# Patient Record
Sex: Male | Born: 1935 | Race: White | Hispanic: No | Smoking: Former smoker
Health system: Southern US, Community
[De-identification: ages and names within clinical notes are randomized; demographics above are authoritative.]

## PROBLEM LIST (undated history)

## (undated) DIAGNOSIS — N189 Chronic kidney disease, unspecified: Secondary | ICD-10-CM

## (undated) DIAGNOSIS — K635 Polyp of colon: Secondary | ICD-10-CM

## (undated) DIAGNOSIS — K219 Gastro-esophageal reflux disease without esophagitis: Secondary | ICD-10-CM

## (undated) DIAGNOSIS — I442 Atrioventricular block, complete: Secondary | ICD-10-CM

## (undated) DIAGNOSIS — K2270 Barrett's esophagus without dysplasia: Secondary | ICD-10-CM

## (undated) DIAGNOSIS — M1990 Unspecified osteoarthritis, unspecified site: Secondary | ICD-10-CM

## (undated) DIAGNOSIS — I25810 Atherosclerosis of coronary artery bypass graft(s) without angina pectoris: Secondary | ICD-10-CM

## (undated) DIAGNOSIS — M810 Age-related osteoporosis without current pathological fracture: Secondary | ICD-10-CM

## (undated) DIAGNOSIS — I10 Essential (primary) hypertension: Secondary | ICD-10-CM

## (undated) DIAGNOSIS — R0602 Shortness of breath: Secondary | ICD-10-CM

## (undated) DIAGNOSIS — J189 Pneumonia, unspecified organism: Secondary | ICD-10-CM

## (undated) DIAGNOSIS — I2510 Atherosclerotic heart disease of native coronary artery without angina pectoris: Secondary | ICD-10-CM

## (undated) DIAGNOSIS — G4730 Sleep apnea, unspecified: Secondary | ICD-10-CM

## (undated) DIAGNOSIS — I219 Acute myocardial infarction, unspecified: Secondary | ICD-10-CM

## (undated) DIAGNOSIS — R55 Syncope and collapse: Secondary | ICD-10-CM

## (undated) DIAGNOSIS — I639 Cerebral infarction, unspecified: Secondary | ICD-10-CM

## (undated) DIAGNOSIS — N400 Enlarged prostate without lower urinary tract symptoms: Secondary | ICD-10-CM

## (undated) DIAGNOSIS — J449 Chronic obstructive pulmonary disease, unspecified: Secondary | ICD-10-CM

## (undated) DIAGNOSIS — I480 Paroxysmal atrial fibrillation: Secondary | ICD-10-CM

## (undated) DIAGNOSIS — E785 Hyperlipidemia, unspecified: Secondary | ICD-10-CM

## (undated) DIAGNOSIS — I255 Ischemic cardiomyopathy: Secondary | ICD-10-CM

## (undated) DIAGNOSIS — Z950 Presence of cardiac pacemaker: Secondary | ICD-10-CM

## (undated) DIAGNOSIS — I739 Peripheral vascular disease, unspecified: Secondary | ICD-10-CM

## (undated) HISTORY — PX: LESION EXCISION: SHX5167

## (undated) HISTORY — PX: PROSTATE SURGERY: SHX751

## (undated) HISTORY — DX: Benign prostatic hyperplasia without lower urinary tract symptoms: N40.0

## (undated) HISTORY — DX: Hyperlipidemia, unspecified: E78.5

## (undated) HISTORY — DX: Atrioventricular block, complete: I44.2

## (undated) HISTORY — DX: Chronic obstructive pulmonary disease, unspecified: J44.9

## (undated) HISTORY — DX: Gastro-esophageal reflux disease without esophagitis: K21.9

## (undated) HISTORY — DX: Age-related osteoporosis without current pathological fracture: M81.0

## (undated) HISTORY — DX: Paroxysmal atrial fibrillation: I48.0

## (undated) HISTORY — DX: Essential (primary) hypertension: I10

## (undated) HISTORY — DX: Cerebral infarction, unspecified: I63.9

## (undated) HISTORY — DX: Barrett's esophagus without dysplasia: K22.70

## (undated) HISTORY — DX: Chronic kidney disease, unspecified: N18.9

---

## 1976-05-07 HISTORY — PX: CORONARY ARTERY BYPASS GRAFT: SHX141

## 1998-01-03 ENCOUNTER — Ambulatory Visit (HOSPITAL_COMMUNITY): Admission: RE | Admit: 1998-01-03 | Discharge: 1998-01-03 | Payer: Self-pay | Admitting: Cardiology

## 1998-11-02 ENCOUNTER — Ambulatory Visit (HOSPITAL_COMMUNITY): Admission: RE | Admit: 1998-11-02 | Discharge: 1998-11-02 | Payer: Self-pay | Admitting: Cardiology

## 1998-11-02 ENCOUNTER — Encounter: Payer: Self-pay | Admitting: Cardiology

## 2000-04-01 ENCOUNTER — Ambulatory Visit (HOSPITAL_COMMUNITY): Admission: RE | Admit: 2000-04-01 | Discharge: 2000-04-01 | Payer: Self-pay | Admitting: Cardiology

## 2000-04-02 ENCOUNTER — Encounter (HOSPITAL_COMMUNITY): Admission: RE | Admit: 2000-04-02 | Discharge: 2000-07-01 | Payer: Self-pay | Admitting: Cardiology

## 2000-06-12 ENCOUNTER — Ambulatory Visit (HOSPITAL_COMMUNITY): Admission: RE | Admit: 2000-06-12 | Discharge: 2000-06-12 | Payer: Self-pay | Admitting: *Deleted

## 2000-06-12 ENCOUNTER — Encounter (INDEPENDENT_AMBULATORY_CARE_PROVIDER_SITE_OTHER): Payer: Self-pay | Admitting: Specialist

## 2000-10-07 ENCOUNTER — Ambulatory Visit (HOSPITAL_COMMUNITY): Admission: RE | Admit: 2000-10-07 | Discharge: 2000-10-07 | Payer: Self-pay | Admitting: Cardiology

## 2000-10-07 HISTORY — PX: CARDIAC CATHETERIZATION: SHX172

## 2001-03-07 ENCOUNTER — Inpatient Hospital Stay (HOSPITAL_COMMUNITY): Admission: EM | Admit: 2001-03-07 | Discharge: 2001-03-08 | Payer: Self-pay | Admitting: Cardiology

## 2001-03-07 ENCOUNTER — Emergency Department (HOSPITAL_COMMUNITY): Admission: EM | Admit: 2001-03-07 | Discharge: 2001-03-07 | Payer: Self-pay | Admitting: *Deleted

## 2001-03-07 ENCOUNTER — Encounter: Payer: Self-pay | Admitting: Cardiology

## 2001-03-15 ENCOUNTER — Encounter (INDEPENDENT_AMBULATORY_CARE_PROVIDER_SITE_OTHER): Payer: Self-pay | Admitting: Specialist

## 2001-03-15 ENCOUNTER — Inpatient Hospital Stay (HOSPITAL_COMMUNITY): Admission: EM | Admit: 2001-03-15 | Discharge: 2001-03-18 | Payer: Self-pay | Admitting: Emergency Medicine

## 2001-08-11 ENCOUNTER — Ambulatory Visit (HOSPITAL_COMMUNITY): Admission: RE | Admit: 2001-08-11 | Discharge: 2001-08-11 | Payer: Self-pay | Admitting: *Deleted

## 2001-08-11 ENCOUNTER — Encounter (INDEPENDENT_AMBULATORY_CARE_PROVIDER_SITE_OTHER): Payer: Self-pay

## 2003-04-12 HISTORY — PX: CARDIAC CATHETERIZATION: SHX172

## 2003-05-08 HISTORY — PX: CHOLECYSTECTOMY: SHX55

## 2005-05-07 HISTORY — PX: PACEMAKER PLACEMENT: SHX43

## 2006-07-02 ENCOUNTER — Ambulatory Visit (HOSPITAL_COMMUNITY): Admission: RE | Admit: 2006-07-02 | Discharge: 2006-07-03 | Payer: Self-pay | Admitting: Cardiology

## 2008-04-19 ENCOUNTER — Encounter (INDEPENDENT_AMBULATORY_CARE_PROVIDER_SITE_OTHER): Payer: Self-pay | Admitting: *Deleted

## 2008-04-19 ENCOUNTER — Ambulatory Visit (HOSPITAL_COMMUNITY): Admission: RE | Admit: 2008-04-19 | Discharge: 2008-04-19 | Payer: Self-pay | Admitting: *Deleted

## 2010-06-12 ENCOUNTER — Other Ambulatory Visit: Payer: Self-pay | Admitting: Cardiology

## 2010-06-12 ENCOUNTER — Ambulatory Visit
Admission: RE | Admit: 2010-06-12 | Discharge: 2010-06-12 | Disposition: A | Discharge status: Home / Self Care | Payer: 59 | Source: Ambulatory Visit | Attending: Cardiology | Admitting: Cardiology

## 2010-06-12 DIAGNOSIS — R0602 Shortness of breath: Secondary | ICD-10-CM

## 2010-06-14 ENCOUNTER — Ambulatory Visit (HOSPITAL_COMMUNITY)
Admission: RE | Admit: 2010-06-14 | Discharge: 2010-06-14 | Disposition: A | Discharge status: Home / Self Care | Payer: Medicare Other | Source: Ambulatory Visit | Attending: Cardiology | Admitting: Cardiology

## 2010-06-14 DIAGNOSIS — I2510 Atherosclerotic heart disease of native coronary artery without angina pectoris: Secondary | ICD-10-CM | POA: Insufficient documentation

## 2010-06-14 DIAGNOSIS — Z951 Presence of aortocoronary bypass graft: Secondary | ICD-10-CM | POA: Insufficient documentation

## 2010-06-14 DIAGNOSIS — I2582 Chronic total occlusion of coronary artery: Secondary | ICD-10-CM | POA: Insufficient documentation

## 2010-06-14 DIAGNOSIS — R0602 Shortness of breath: Secondary | ICD-10-CM | POA: Insufficient documentation

## 2010-06-14 HISTORY — PX: CARDIAC CATHETERIZATION: SHX172

## 2010-06-21 NOTE — Procedures (Signed)
NAME:  Richard Chavez, Richard Chavez                  ACCOUNT NO.:  [id number]  MEDICAL RECORD NO.:  [id number]           PATIENT TYPE:  O  LOCATION:  [facility]                         FACILITY:  [facility]  PHYSICIAN:  Ritta Slot, MD     DATE OF BIRTH:  04-27-1927  DATE OF PROCEDURE:  06/14/2010 DATE OF DISCHARGE:  06/14/2010                           CARDIAC CATHETERIZATION   This is a left heart catheterization with graft.  INDICATIONS:  Mr. Richard Chavez is a very pleasant 77 year old gentleman with a history of CABG in 1998 with SVG to the RCA, SVG to circumflex, SVG to LIMA from December 2004, normal LV function at that time.  His most recent echocardiogram on October 2011 showed an EF of 35% to 40% with a negative nuclear perfusion stress test.  Despite that, he has been having increased episodes of shortness of breath, I therefore decided to bring him back to cardiac catheterization laboratory to exclude graft dysfunction as a cause of his low EF.  After informed consent, the patient was prepped and draped in usual sterile fashion. His right groin was accessed using a 5-French sheath into the RFA using a modified Seldinger technique without difficulty.  He received 2 mg of Versed and 25 mcg of fentanyl for IV light sedation.  Initially through the RFA, I passed the JR-4 catheter.  This was used to engage the SVG to the OM, the SVG to the RCA, the native RCA as well as getting access in the left subclavian.  Once I was into the left subclavian, I used an exchange wire, which I exchanged the RCA catheter for a LIMA catheter. The LIMA catheter was then used engage the LIMA without difficulty.  The LIMA catheter was then exchanged over J-wire for the JL-4 catheter.  JL- 4 catheter was placed in the left main without difficulty.  FINDINGS:  AO pressure 144/71, mean of 98.  RCA, the native RCA was 100% occluded.  The left circulatory system was found to have a patent left main into the circumflex.   Circumflex had a 100% occlusion in its midportion.  The LAD was occluded and bifurcation at its ostium of the left main.  The LIMA was found be patent down all the way down to the LAD without any evidence of any occlusion as retrograde filling of the LAD via the LIMA as well as the diagonal.  The SVG to the OM with circumflex territory was found to be patent, free of any disease.  There was no significant disease in the distal portion and the graft was widely patent.  SVG to the RCA was widely patent all the way down to bifurcation of the PDA and PLA.  There is retrograde filling of the RCA by the SVG graft.  There is no significant disease.  I do not perform left ventriculography due to his creatinine peaked to 1.4 at the age of 77 years old.  I did use a total of 100 mL of contrast up.  FINAL CONCLUSIONS:  A patent LIMA to the LAD, patent SVG to the RCA, patent SVG to OM1 circumflex, 100% occluded  RCA, 100% occluded circumflex, 100% occluded LAD.  Normal graft.  No evidence of any graft dysfunction.  We will continue on his current medical therapy.  I do not think his graft dysfunction is a cause of his shortness of breath.    Ritta Slot, MD    HS/MEDQ  D:  06/14/2010  T:  06/15/2010  Job:  [id number]  Electronically Signed by Ritta Slot MD on 06/21/2010 01:47:31 PM

## 2010-09-19 NOTE — Op Note (Signed)
NAME:  Richard Chavez, Richard Chavez                  ACCOUNT NO.:  [id number]   MEDICAL RECORD NO.:  [id number]          PATIENT TYPE:  AMB   LOCATION:  ENDO                         FACILITY:  [facility]   PHYSICIAN:  Georgiana Spinner, M.D.    DATE OF BIRTH:  09-Nov-1926   DATE OF PROCEDURE:  DATE OF DISCHARGE:                               OPERATIVE REPORT   PROCEDURE:  Upper endoscopy.   INDICATIONS:  GERD.   ANESTHESIA:  Fentanyl 37.5 mcg, Versed 3 mg.   PROCEDURE:  With the patient mildly sedated in the left lateral  decubitus position the Pentax videoscopic endoscope inserted in the  mouth and passed under direct vision through the esophagus which  appeared normal then we advanced through the lower esophageal sphincter  into the stomach, fundus, body, antrum, duodenal bulb, second portion  duodenum were eventually seen and all appeared normal.  From this point,  the endoscope was slowly withdrawn taking circumferential views of  duodenal mucosa until the endoscope had been pulled back into the  stomach and placed in retroflexion to view the stomach from below.  The  endoscope was straightened and withdrawn taking circumferential views of  the remaining gastric and esophageal mucosa.  The patient's vital signs  and pulse oximeter remained stable.  The patient tolerated the procedure  well without apparent complications.   FINDINGS:  Unremarkable examination with loose wrap of the GE junction  around the endoscope indicating laxity of the lower esophageal  sphincter.   PLAN:  Proceed to colonoscopy.           ______________________________  Georgiana Spinner, M.D.     GMO/MEDQ  D:  04/19/2008  T:  04/20/2008  Job:  [id number]

## 2010-09-19 NOTE — Op Note (Signed)
NAME:  Richard Chavez, Richard Chavez                  ACCOUNT NO.:  [id number]   MEDICAL RECORD NO.:  [id number]          PATIENT TYPE:  AMB   LOCATION:  ENDO                         FACILITY:  [facility]   PHYSICIAN:  Georgiana Spinner, M.D.    DATE OF BIRTH:  Oct 22, 1926   DATE OF PROCEDURE:  04/19/2008  DATE OF DISCHARGE:                               OPERATIVE REPORT   PROCEDURE:  Colonoscopy.   INDICATIONS:  Colon polyps.   ANESTHESIA:  Fentanyl 12 mcg, Versed 2 mg.   PROCEDURE:  With the patient mildly sedated in the left lateral  decubitus position, a rectal examination was attempted.  Subsequently  the Pentax videoscopic colonoscope was inserted in the rectum and passed  under direct vision with pressure applied.  The patient rolled to his  back to reach the cecum, identified by ileocecal valve and appendiceal  orifice, both of which were photographed.  From this point the  colonoscope was slowly withdrawn taking circumferential views of colonic  mucosa, stopping in the ascending colon where a polyp was seen,  photographed, removed using hot biopsy forceps technique setting of  20/150 blended current.  We next stopped in the hepatic flexure where a  second polyp was seen.  It too was photographed and it too was removed  using hot biopsy forceps with the same setting.  The endoscope was then  further withdrawn taking circumferential views of the remaining colonic  mucosa, stopping only in the rectum where a multilobulated polyp seen,  photographed and removed using snare cautery technique setting of 20/150  blended current.  Tissue was retrieved by suction and the polyp through  the endoscope into a tissue trap.  Then subsequently we hot biopsied the  base because there was some bleeding from this point.  The endoscope was  then placed in retroflexion to view the anal canal from below.  The  endoscope was then straightened and withdrawn taking circumferential  views of the remaining colonic mucosa.   The patient's vital signs and  pulse oximeter remained stable.  The patient tolerated the procedure  well without apparent complications.   FINDINGS:  Polyps of the ascending colon, hepatic flexure and rectum.  Some diverticula noted in the sigmoid colon; otherwise unremarkable  exam.   PLAN:  Await biopsy report.  The patient will call me for results and  follow up with me as an outpatient.           ______________________________  Georgiana Spinner, M.D.     GMO/MEDQ  D:  04/19/2008  T:  04/20/2008  Job:  [id number]

## 2010-09-22 NOTE — Procedures (Signed)
[facility]  Patient:    Richard Chavez, Richard Chavez                         MRN: [id number] Proc. Date: 06/12/00 Adm. Date:  [id number] Attending:  Sabino Gasser                           Procedure Report  PROCEDURE:  Upper endoscopy.  INDICATION FOR PROCEDURE:  Reflux symptomatology.  ANESTHESIA:  Demerol 50 mg, Versed 5 mg.  DESCRIPTION OF PROCEDURE:  With the patient mildly sedated in the left lateral decubitus position, the Olympus video endoscope was inserted in the mouth and passed under direct vision through the esophagus, photograph taken. We entered into the stomach into the hiatal hernia. The fundus, body, antrum, duodenal bulb, and second portion of the duodenum were all well visualized and appeared normal. Photographs were taken. From this point, the endoscope was slowly withdrawn taking circumferential views of the entire duodenal mucosa until the endoscope was then pulled back into the stomach, placed in retroflexion to view the stomach from below and a hiatal hernia was visualized from below. The endoscope was then straightened taking circumferential views of the entire gastric mucosa stopping in the distal esophagus where there was a question of a short segment Barretts esophagus seen, photographed, and biopsied. We then withdrew the endoscope taking circumferential views of the remaining esophageal mucosa which otherwise appeared normal.  IMPRESSION:  Possible short segment Barretts esophagus above a hiatal hernia. Await biopsy report. The patient will call me for results and followup with me in as an outpatient and continue Nexium which has helped his symptoms tremendously. DD:  06/12/00 TD:  06/13/00 Job: [id number]

## 2010-09-22 NOTE — Discharge Summary (Signed)
[facility]  Patient:    SAYRE, WITHERINGTON Visit Number: [id number] MRN: [id number]          Service Type: MED Location: [id number] Attending Physician:  Meredith Leeds Dictated by:   Jaclyn Prime. Lucas Mallow, M.D. Admit Date:  03/15/2001 Discharge Date: 03/18/2001                             Discharge Summary  CHIEF COMPLAINT:  Abdominal pain and nausea.  HISTORY OF PRESENT ILLNESS:  This 77 year old male came into the emergency room with a chief complaint of vomiting and abdominal pain.  This developed after he ate spaghetti the night before admission.  He had vomited three or four times.  He had no chest pain.  He did have some diaphoresis. Comprehensive details of past medical history, family, and social history, as well as a comprehensive review of systems, are to be found in the history and physical.  PHYSICAL EXAMINATION:  GENERAL:  He was a moderately overweight, but otherwise well-developed, well-nourished man who had received a GI cocktail after many hours in the emergency room, and then felt better.  VITAL SIGNS:  Blood pressure 140/90, pulse 48 and regular, respiratory rate 18 and unlabored, temperature 96.8 degrees.  He had an otherwise generally unremarkable physical exam.  ABDOMEN:  Moderately protuberant, and when seen by me it was not tender.  The rest of the physical examination was unremarkable.  LABORATORY DATA:  Total CPK peaked at 303 units with 3.9 units of MB band. troponin-I was 0.02, 0.02, and 0.01 on three occasions.  The other routine laboratory data appear not to appear in the chart at this point.  EKG was consistent with sinus bradycardia.  HOSPITAL COURSE:  Following treatment as noted above, he had prompt resolution of his abdominal discomfort, and spent a comfortable night.  He was seen on rounds by the on call physician the next morning, considered to be stable and without further problems, and was discharged at  that time.  FINAL DIAGNOSES: 1. Acute nausea and vomiting, resolved. 2. Abdominal tenderness, resolved. 3. Coronary heart disease, with no evidence of significant myocardial    ischemia. 4. History of smoking. 5. Hiatal hernia.  CONDITION ON DISCHARGE:  Improved and stable.  FOLLOWUP:  Dr. Lucas Mallow in one week.  DISCHARGE MEDICATIONS:  As at admission, which would include Toprol 50 mg q.d., Nexium 40 mg q.d., Cozaar 50 mg q.d., Lipitor 20 mg q.d., Advair inhaler q.d., Ventolin inhaler 2 puffs b.i.d., coated aspirin one q.d.  ADDITIONAL FINAL DIAGNOSES:  Chronic obstructive pulmonary disease.  OPERATIONS:  None.  COMPLICATIONS:  None.  RETURN TO WORK:  Inapplicable.Dictated by:   Jaclyn Prime. Lucas Mallow, M.D.  Attending Physician:  Meredith Leeds DD:  04/17/01 TD:  04/17/01 Job: [id number]

## 2010-09-22 NOTE — Discharge Summary (Signed)
[facility]  Patient:    Richard Chavez, Richard Chavez. Visit Number: [id number] MRN: [id number]          Service Type: Attending:  Zigmund Daniel, M.D. Dictated by:   Zigmund Daniel, M.D. Adm. Date:  03/15/01 Disc. Date: 03/18/01                             Discharge Summary  HISTORY OF PRESENT ILLNESS:  The patient is a 77 year old white male who is shown to have gallstones on ultrasound.  CT was compatible with acute cholecystitis.  Symptoms were abdominal pain and vomiting.  There is also a history of coronary artery disease and reflux esophagitis, both felt to be stable.  See other records for more details.  HOSPITAL COURSE:  The patient was admitted late at night on 03/15/01, and was stabilized, given IV fluids, and pain medication.  The next day, he underwent a laparoscopic cholecystectomy.  This was somewhat difficult because of acute findings, but it was able to be done that way.  A drain was left in.  The next morning, he was stable and comfortable, but moderate drainage that appeared to be bloody, but perhaps bilious.  IV antibiotics were continued.  On 03/18/01, the patient was doing quite well, with a soft belly, drainage had almost gone away, but still had a slight yellow color.  It was felt that it was possible he had a small leak of bile.  He desired discharge, and was sent home with the drain in place, and asked to see me in the office in 6 to 7 days for probable removal of the drain.  DIAGNOSES: 1. Cholelithiasis. 2. Acute cholecystitis.  OPERATION:  Laparoscopic cholecystectomy.  CONDITION ON DISCHARGE:  Improved. Dictated by:   Zigmund Daniel, M.D. Attending:  Zigmund Daniel, M.D. DD:  04/22/01 TD:  04/22/01 Job: [id number]

## 2010-09-22 NOTE — H&P (Signed)
[facility]  Patient:    Richard Chavez, DEC Visit Number: [id number] MRN: [id number]          Service Type: MED Location: [id number] [id number] Attending Physician:  Meredith Leeds Dictated by:   Zigmund Daniel, M.D. Admit Date:  03/15/2001 Discharge Date: 03/18/2001                           History and Physical  CHIEF COMPLAINT:  Abdominal pain.  HISTORY OF PRESENT ILLNESS:  The patient is a 77 year old white male with several episodes of upper abdominal pain.  He was hospitalized in early November with vomiting and abdominal pain and was felt to have viral enteritis.  With conservative treatment, that got better.  A CT scan done on the day of admission showed cholelithiasis and evidence of acute cholecystitis.  He is admitted for resuscitation and cholecystectomy.  PAST MEDICAL HISTORY:  The patient has coronary artery disease and has had coronary bypass grafting in 1988.  He occasionally has chest discomfort.  The patient is a former smoker.  Childhood illnesses are not remarkable.  MEDICATIONS: 1. Toprol 50 mg daily. 2. Cozaar 50 mg daily. 3. Nexium 40 mg daily. 4. Vitamins and fish oil. 5. Occasional inhaler for asthma.  REVIEW OF SYSTEMS:  Unremarkable beyond the above.  PHYSICAL EXAMINATION:  Temperature and vital signs unremarkable per nursing report.  MENTAL STATUS:  Normal.  HEENT:  Unremarkable.  NECK:  Unremarkable.  CHEST:  Clear to auscultation.  HEART:  Regular rate and rhythm.  No murmur or gallops.  ABDOMEN:  Nontender in the right upper quadrant.  Slightly distended and protuberant.  RECTAL:  Not performed.  EXTREMITIES:  Normal.  LYMPH NODES:  None enlarged.  NEUROLOGIC:  Normal.  IMPRESSION: 1. Acute cholecystitis and cholelithiasis. 2. Hypertension, stable. 3. Coronary artery disease, stable.  PLAN:  Admission for cholecystectomy Dictated by:   Zigmund Daniel, M.D. Attending Physician:  Meredith Leeds DD:  04/22/01 TD:  04/22/01 Job: [id number]

## 2010-09-22 NOTE — Procedures (Signed)
[facility]  Patient:    Richard Chavez, Richard Chavez Visit Number: [id number] MRN: [id number]          Service Type: END Location: ENDO Attending Physician:  Sabino Gasser Dictated by:   Sabino Gasser, M.D. Proc. Date: 08/11/01 Admit Date:  08/11/2001                             Procedure Report  PROCEDURE:  Upper endoscopy with biopsy.  INDICATION:  GERD.  ANESTHESIA:  Demerol 50, Versed 3 mg.  DESCRIPTION OF PROCEDURE:  With the patient mildly sedated in the left lateral decubitus position, the Olympus videoscopic endoscope was inserted into the mouth and passed under direct vision through the esophagus which appeared normal. I did not see evidence of Barretts at this time.  We entered into the stomach; fundus, body, antrum, duodenal bulb, and second portion of duodenum all appeared normal.  From this point, the endoscope was slowly withdrawn, taking circumferential views of the duodenal mucosa until the endoscope then pulled back into the stomach and placed in retroflexion to view the stomach from below.  The endoscope was then straightened and withdrawn, taking circumferential views of the remaining gastric and esophageal mucosa. The patients vital signs and pulse oximeter remained stable.  The patient tolerated the procedure well without apparent complications.  FINDINGS:  Unremarkable examination.  PLAN: 1. Await biopsy reports to rule out Barretts. 2, Have the patient follow up with me as an outpatient. Dictated by:   Sabino Gasser, M.D. Attending Physician:  Sabino Gasser DD:  08/11/01 TD:  08/11/01 Job: [id number]

## 2010-09-22 NOTE — H&P (Signed)
[facility]  Patient:    Richard Chavez, Richard Chavez Visit Number: [id number] MRN: [id number]          Service Type: MED Location: 3W [phone] Attending Physician:  Clovis Cao Dictated by:   Jaclyn Prime. Lucas Mallow, M.D. Admit Date:  03/07/2001                           History and Physical  CHIEF COMPLAINT:  Abdominal pain and nausea.  HISTORY OF PRESENT ILLNESS:  This 77 year old male, a patient of mine, came to the emergency room with a chief complaint of vomiting and abdominal pain.  He said this developed after he ate spaghetti last night.  The pain continued overnight and he came to the emergency room because he had had no relief.  He had had no diarrhea.  It is of note that he has recently had an upper GI and colonoscopic examination by Dr. Virginia Rochester.  He has vomited a total of three or four times.  He had no chest pain.  He did have some diuresis.  PAST MEDICAL HISTORY:  His past medical history is most notable for a coronary artery bypass grafting in 1988.  He has occasionally had chest discomfort over the last three or four years; however, he has never had unstable angina syndrome since his bypass surgery.  He stopped smoking at the time of his bypass surgery.  MEDICATIONS:  His current medications with assumed doses are Toprol 50 mg daily, which was recently reduced because of slow heart rate; Cozaar 50 mg daily; Nexium 40 mg daily; fish oil; multivitamins; and a metered-dose inhaler.  FAMILY HISTORY:  His father died of 66 of leukemia and had a prior heart attack.  His mother died at age 24 of dementia.  SOCIAL HISTORY:  He is retired and widowed.  He lives with his son.  REVIEW OF SYSTEMS:  CONSTITUTIONAL:  He denies fevers, chills, or sweats. EYES:  No diplopia or blurring.  ENT:  He has his own teeth and has no difficulty swallowing.  CARDIAC:  See the history of the present illness. RESPIRATORY:  He has rare wheezing, for which he uses a  metered-dose inhaler. GASTROINTESTINAL:  He has a history of hiatal hernia and is on Nexium for gastroesophageal reflux disease.  GENITOURINARY:  No burning or pain.  He has nocturia x 1 or 2.  NEUROLOGIC:  No faintness or syncope.  PSYCHIATRIC:  No depression or hallucination.  MUSCULOSKELETAL:  Occasional mild pain in his knees but no specific problem.  ENDOCRINE:  No known diabetes or thyroid disease.  HEMATOLOGIC/LYMPHATIC:  No easy bruising.  ALLERGIC/LYMPHATIC: There is a questionable allergy to PENICILLIN.  PHYSICAL EXAMINATION:  VITAL SIGNS:  Blood pressure 140/90, pulse 48 and regular, respirations 18 and unlabored, temperature 96.8 degrees.  GENERAL:  He is a moderately overweight but otherwise well-developed and well-nourished man, who is sleepy following his GI cocktail and being up most of the night.  His mood and affect and appropriate.  HEENT:  His eyes reveal no xanthelasma, icterus, or arcus senilis.  NECK:  Supple and symmetrical.  The trachea is midline and mobile.  There is no palpable thyromegaly or cervical nodes, no carotid bruit or JVD.  BACK:  Straight and nontender.  CHEST:  Clear to auscultation and percussion.  CARDIAC:  Cardiac apical impulse is cryptic.  The left border of the cardiac dullness is obscure.  The heart sounds are  distant.  The heart rhythm is regular, and the rate is quite slow.  There is no gallop, click, or murmur. His gait is not tested.  He can probably participate in a partial stress test. The digits and nails reveal no clubbing or cyanosis.  SKIN:  Without ulcer or stasis dermatitis.  ABDOMEN:  Moderately protuberant and although it was described as diffusely tender previously, it is not tender now.  Bowel sounds are present.  The abdominal aorta is not palpable, and there is no bruit.  The femoral arteries are not palpable, and there is no bruit.  EXTREMITIES:  The ankles reveal trace edema and palpable posterior  tibial pulses.  DIAGNOSTIC STUDIES:  An EKG shows sinus bradycardia, low-voltage QRS complex, and possible Q-wave in V1.  There is no acute ST-T elevation.  Initial cardiac enzymes revealed a slightly and somewhat elevated total CPK of 334 units, with 5.1 units of MB band.  Troponin I was 0.03.  His white count is 8800 with a slight shift to the left, 84% polys.  So-called comprehensive metabolic panel is normal except for creatinine of 1.3.  Lipase is 23 units, low limit of normal.  PLAN:  The patient was felt by the emergency room physician to require admission for further evaluation and possible treatment.  We will admit him, put him on a clear liquid diet, administer intravenous fluids, and repeat cardiac enzymes.  If there is no progressive change, he can probably be discharged in 24 hours.  FINAL IMPRESSION:  Acute abdominal pain and vomiting, possible gastritis or gastroenteritis, unstable coronary syndrome to be ruled out. Dictated by:   Jaclyn Prime. Lucas Mallow, M.D. Attending Physician:  Clovis Cao DD:  03/07/01 TD:  03/08/01 Job: [id number]

## 2010-09-22 NOTE — Op Note (Signed)
Richard Chavez, Richard Chavez                  ACCOUNT NO.:  [id number]   MEDICAL RECORD NO.:  [id number]          PATIENT TYPE:  OIB   LOCATION:  2807                         FACILITY:  [facility]   PHYSICIAN:  Antionette Char, MD    DATE OF BIRTH:  Feb 18, 1927   DATE OF PROCEDURE:  07/02/2006  DATE OF DISCHARGE:                               OPERATIVE REPORT   REFERRING PHYSICIAN:  Jaclyn Prime. Lucas Mallow, M.D.   DATE OF PROCEDURE:  July 02, 2006.   PROCEDURE:  Insertion of dual-chamber permanent transvenous pacemaker.   INDICATIONS FOR PROCEDURE:  Sick sinus syndrome with symptomatic  bradycardia.   PROCEDURE:  After signing an informed consent, the patient was  premedicated with 5 mg of Valium by mouth and brought to the cardiac  catheterization lab at [facility].  His right anterior chest  and base of neck were prepped and draped in a sterile fashion and a  right transverse subclavicular plane was anesthetized locally with 1%  lidocaine.  An incision was made in this anesthetized plane with the  incision being deepened into the fascial layer overlying the pectoralis  muscle.  A pocket was then formed in this fascial layer for later  insertion of the pulse generator.  We then inserted two #7 Cook  introducer sheaths percutaneously into the right subclavian vein with  both Seldinger wires being easily passed into the superior vena cava.  A  bipolar ventricular pacing electrode was then selected made by [facility], model number 1646T, serial number [id number].  After proper  preparation it was inserted through a Cook introducer sheath and  advanced to the superior vena cava.  The sheath was removed in the usual  fashion.  We then selected a bipolar active-fixation lead to put in the  atrium and inserted it through the second [facility] introducer sheath and  advanced it to the superior vena cava.  The second sheath was removed in  the usual fashion.  We then advanced the ventricular lead into  the right  atrium and right ventricle, where the tip was positioned in the apex of  the right ventricle.  Very good pacing parameters were obtained with an  R wave sensitivity of 12.3 mV, a resistance of 665 Ohms, and a minimum  voltage threshold of 0.875 V.  This lead was then secured properly at  its insertion site.  We then advanced the atrial lead into the right  atrium, where the tip was positioned anteriorly in the right atrial  appendage.  After actively advancing and fixing the screw-in lead, the  lead was analyzed finding very good pacing parameters with a minimum  voltage threshold of 0.75 V, a P wave sensitivity of 1.6 mV, and a  resistance of 500 ohms.  After obtaining these pacing parameters, the  atrial lead was secured properly at its insertion site.  The wound was  lavaged profusely with a kanamycin solution.  We then selected a pulse  generator made by [employer], model Bensley, model number [id number],  serial number [id number].  After properly analyzing  the new pulse  generator, it was attached to the pacing electrodes in the usual  fashion.  The pulse generator was then placed within the previously-  formed pocket and the wound was closed in layers using 2-0 Vicryl.  Final skin closure was obtained with a cutaneous layer of Steri-Strips.  The patient tolerated the procedure well and no complications  were noted.  At the end of the procedure a sterile bulky dressing was  applied to the wound and he was admitted to telemetry for further  monitoring and further doses of intravenous antibiotic.  Wound care  instructions were given.  Medications given:  None.      Antionette Char, MD  Electronically Signed     JRT/MEDQ  D:  07/02/2006  T:  07/02/2006  Job:  [id number]   cc:   Catheterization Lab

## 2010-09-22 NOTE — Cardiovascular Report (Signed)
[facility]  Patient:    Richard Chavez, Richard Chavez                         MRN: [id number] Proc. Date: 10/07/00 Adm. Date:  [id number] Attending:  Silvestre Mesi CC:         Jaclyn Prime. Lucas Mallow, M.D.  Cath Lab   Cardiac Catheterization  PROCEDURE: 1. Left heart catheterization. 2. Coronary cineangiography. 3. Vein graft cineangiography. 4. Left internal mammary artery graft cineangiography. 5. Left ventricular cineangiography. 6. Abdominal aortogram. 7. Perclose of the right femoral artery.  INDICATIONS:  This 77 year old male has a history of three vessel coronary artery disease, status post coronary artery bypass graft surgery in 1988.  He has had a long history of hypertension.  Recently, he has had marked weakness and a borderline stress test done in February.  A follow-up dobutamine Cardiolite stress test on May 13, showed stress-induced hypoperfusion and nonsustained ventricular tachycardia.  He also had an area of anterolateral ischemia.  He was then scheduled for cardiac catheterization.  DESCRIPTION OF PROCEDURE:  After signing an informed consent, the patient was premedicated with 50 mg of Benadryl intravenously and brought to the cardiac catheterization lab.  His right groin was prepped and draped in a sterile fashion and anesthetized locally with 1% lidocaine.  A 6 French introducer sheath was inserted percutaneously into the right femoral artery.  6 [place] #4 Judkins coronary catheters were used to make injections into the native coronary arteries.  The right coronary catheter was used to make injections into the vein grafts and also into the left internal mammary graft to the LAD.  A 6 French pigtail catheter was used to measure pressures in the left ventricle and aorta and to make midstream injections into the left ventricle and abdominal aorta.  The patient tolerated the procedure well and no complications were noted.  At the end of the  procedure, the catheter and sheath were removed from the right femoral artery and hemostasis was easily obtained with a Perclose closure system.  MEDICATIONS GIVEN:  None.  HEMODYNAMIC DATA:  Left ventricular pressure 147/0-15, aortic pressure 146/68 with a mean of 95.  Left ventricular ejection fraction was estimated at betwee 45 and 50%.  CINE FINDINGS:  Coronary cineangiography.  Left coronary artery.  The ostium and left main appear normal.  Left anterior descending.  The LAD is totally occluded at its origin. The mid and distal segment fills by way of internal mammary graft.  Circumflex coronary artery.  The circumflex has a critical 95% focal stenosis in its middle segment with the distal vessels filling by way of vein graft and also filling antegrade.  Right coronary artery.  The right coronary artery is totally occluded in its proximal segment.  The mid and distal segment fill by way of vein graft.  VEIN GRAFT CINEANGIOGRAPHY:  Right coronary artery vein graft.  The right coronary artery vein graft appears to be very normal with very normal flow into the two distal anastomotic sites.  There is a side-to-side anastomosis into an acute marginal branch and end-to-side anastomosis into the posterior descending.  The acute marginal branch has very normal flow through the vein graft and also the posterior descending has very normal flow.  The remainder of the right coronary artery was nonvisualized.  Circumflex vein graft.  This is a sequential vein graft to three sites.  All three anastomotic sites appear normal with side-to-side anastomosis in  a first and second obtuse marginal branches and an end-to-side anastomosis distally into a posterolateral branch.  The vein appears to be totally normal with normal flow throughout and supplies the first obtuse marginal or intermediate branch with very good flow.  The distal circumflex system fills both antegrade and through the vein  graft.  Left internal mammary artery graft to the LAD.  This graft appears normal with a normal appearing distal anastomotic site with an end-to-side anastomosis in the mid LAD.  The distal LAD is a small vessel, but appears normal with normal flow.  The segment of LAD proximal to the anastomosis has severe disease with severe segmental narrowing of the middle segment.  This middle segment supplies two anterolateral branches and may be the site of his abnormal ischemic area on his ready nuclear study.  This was deemed to be unapproachable by an intravascular method.  LEFT VENTRICULAR CINEANGIOGRAPHY:  The left ventricular chamber size is mildly enlarged.  The overall left ventricular contractility is mildly decreased with an ejection fraction estimated at between 45 and 50%.  The anterior segment has normal function.  The apex has moderate hypokinesia.  The inferior segment has moderate hypokinesia.  The mitral valve has mild prolapse, but without significant mitral insufficiency.  There was no calcification of the valve.  ABDOMINAL AORTOGRAM:  The abdominal aorta has diffuse calcified, atherosclerotic plaque in the mid and distal segment.  The distal segment just before the bifurcation has a mildly dilated area which would be considered to be a mild aneurysm.  This calcified atherosclerotic plaque extends into both common iliac arteries.  The renal arteries have mild plaque, but without significant stenosis.  FINAL DIAGNOSES: 1. Severe three vessel coronary artery disease. 2. Normal vein grafts to the right coronary artery and circumflex systems. 3. Normal left internal mammary artery graft to the LAD. 4. Mild left ventricular dysfunction. 5. Mild distal abdominal aortic aneurysm. 6. Successful Perclose of the right femoral artery.  DISPOSITION:  I feel that continued medical treatment is in order with follow-up of his abdominal aortic aneurysm with an ultrasound in the  office.  His ischemic area seen on the ready nuclear test probably correlates with his anterolateral branches with total occlusion of the proximal LAD and severe disease in the middle segment proximal to the distal anastomosis of the left internal mammary artery graft.  This should be treated medically. DD:  10/07/00 TD:  10/07/00 Job: [id number]

## 2010-09-22 NOTE — Op Note (Signed)
[facility]  Patient:    Richard Chavez, Richard Chavez Visit Number: [id number] MRN: [id number]          Service Type: MED Location: [phone] Attending Physician:  Meredith Leeds Dictated by:   Zigmund Daniel, M.D. Proc. Date: 03/16/01 Admit Date:  03/15/2001   CC:         Jaclyn Prime. Lucas Mallow, M.D.  Sabino Gasser, M.D.   Operative Report  PREOPERATIVE DIAGNOSIS:  Acute cholecystitis and cholelithiasis.  POSTOPERATIVE DIAGNOSIS:  Acute cholecystitis and cholelithiasis.  OPERATION:  Laparoscopic cholecystectomy.  SURGEON:  Zigmund Daniel, M.D.  ASSISTANT:  Abigail Miyamoto, M.D.  ANESTHESIA:  General.  PROCEDURE: After the patient had adequate monitoring and was well-anesthetized and had routine preparation and draping of the abdomen, I made a 3 cm transverse incision just below the umbilicus.  I dissected down to the fascia and opened it longitudinally and entered the peritoneum bluntly with my finger.  There were no adhesions.  I put in a pursestring 0 Vicryl suture and secured a Hasson cannula and inflated the abdomen with CO2.  A put in the scope and saw a distended, inflamed gallbladder with adhesions of omentum to the undersurface.  The liver appeared normal.  The intestines appeared normal. Placing the patient head up and foot down and tilted to the left, I put in a 10 mm epigastric port and two 5 mm right subcostal ports and drained the gallbladder with a suction aspirator.  The gallbladder was extremely fragile and also bled easily.  It was thick-walled and extremely inflamed.  Some small stones were spilled into the abdomen, and I retrieved those as well as possible and irrigated them and sucked them up into a large irrigator as well.  I retracted the fundus of the gallbladder upward and dissected away the adhesions and then retracted the infundibulum laterally and dissected until I felt I had identified the cystic duct.  On attempting to  dissect around the cystic duct, it broke in two because it was extremely fragile.  I grasped the cystic duct stump and attempted to put clips on it and could only get one clip on because of the inflammation in the tissues and difficulty in visualizing the structures.  I did find the cystic artery and clipped it with three clips and cut between the two closer to the gallbladder.  I then dissected the gallbladder from the liver using a spatula cautery.  I got hemostasis with the cautery.  After detaching the gallbladder from the liver, I removed it from the body in a plastic pouch and then copiously irrigated the right upper quadrant and removed the irrigant.  I found hemostasis to be good.  I placed a 32 [place] round Blake drain, brought out through a lateral incision and put the tip of it in the gallbladder fossa near the cystic duct stump.  At the time of closure, I saw no evidence of leakage of bile from the cystic duct stump.  I then removed the epigastric and lateral ports under direct vision and removed the CO2 from the body and then removed the umbilical port and tied the pursestring suture.  I closed the skin with intercuticular 4-0 Vicryl and Steri-Strips.  I anesthetized all the incisions before closure.  The patient was stable through the operation. Dictated by:   Zigmund Daniel, M.D. Attending Physician:  Meredith Leeds DD:  03/16/01 TD:  03/17/01 Job: [id number]

## 2011-04-25 ENCOUNTER — Encounter: Payer: Self-pay | Admitting: Emergency Medicine

## 2011-04-26 ENCOUNTER — Ambulatory Visit (INDEPENDENT_AMBULATORY_CARE_PROVIDER_SITE_OTHER)
Admission: RE | Admit: 2011-04-26 | Discharge: 2011-04-26 | Disposition: A | Discharge status: Home / Self Care | Payer: Medicare Other | Source: Ambulatory Visit | Attending: Emergency Medicine | Admitting: Emergency Medicine

## 2011-04-26 ENCOUNTER — Encounter: Payer: Self-pay | Admitting: Emergency Medicine

## 2011-04-26 ENCOUNTER — Ambulatory Visit (INDEPENDENT_AMBULATORY_CARE_PROVIDER_SITE_OTHER): Payer: Medicare Other | Admitting: Emergency Medicine

## 2011-04-26 DIAGNOSIS — I219 Acute myocardial infarction, unspecified: Secondary | ICD-10-CM

## 2011-04-26 DIAGNOSIS — K219 Gastro-esophageal reflux disease without esophagitis: Secondary | ICD-10-CM | POA: Insufficient documentation

## 2011-04-26 DIAGNOSIS — I499 Cardiac arrhythmia, unspecified: Secondary | ICD-10-CM

## 2011-04-26 DIAGNOSIS — J449 Chronic obstructive pulmonary disease, unspecified: Secondary | ICD-10-CM

## 2011-04-26 DIAGNOSIS — E7800 Pure hypercholesterolemia, unspecified: Secondary | ICD-10-CM

## 2011-04-26 DIAGNOSIS — K2270 Barrett's esophagus without dysplasia: Secondary | ICD-10-CM

## 2011-04-26 DIAGNOSIS — I472 Ventricular tachycardia: Secondary | ICD-10-CM | POA: Insufficient documentation

## 2011-04-26 DIAGNOSIS — I10 Essential (primary) hypertension: Secondary | ICD-10-CM

## 2011-04-26 DIAGNOSIS — J309 Allergic rhinitis, unspecified: Secondary | ICD-10-CM | POA: Insufficient documentation

## 2011-04-26 NOTE — Assessment & Plan Note (Signed)
-   continue same Advair + Spiriva - walking oximetry today - CXR today - follow in 4 months or prn

## 2011-04-26 NOTE — Patient Instructions (Addendum)
Please continue your Spiriva and Advair as you are taking them Use combivent 2 puffs if needed for shortness of breath Continue your prednisone Walking oximetry today CXR today Follow with Dr Delton Coombes 4 months or sooner if you have any problems.

## 2011-04-26 NOTE — Progress Notes (Signed)
Subjective:    Patient ID: Richard Chavez, male    DOB: Nov 03, 1926, 77 y.o.   MRN: [id number]  HPI 77 yo former smoker, hx CAD/CABG, allergies, HTN. Dx with COPD many years ago. Followed by Dr Thea Silversmith. He has been rx with steroids and more recently with levofloxacin for suspected exacerbation and acute bronchitis.  He tells me that he is feeling better - less cough, still bothers him at night. Mucous is less purulent. He is on Spiriva + Advair. Also uses Prednisone 5mg  qd chronically, presumably for his COPD. He believes that his breathing and cough are improved, close to baseline.    Review of Systems  Constitutional: Negative.  Negative for fever and unexpected weight change.  HENT: Positive for ear pain, congestion and sneezing. Negative for nosebleeds, sore throat, rhinorrhea, trouble swallowing, dental problem, postnasal drip and sinus pressure.   Eyes: Negative.  Negative for redness and itching.  Respiratory: Positive for cough and shortness of breath. Negative for chest tightness and wheezing.   Cardiovascular: Positive for palpitations and leg swelling.  Gastrointestinal: Positive for abdominal pain. Negative for nausea and vomiting.  Genitourinary: Negative.  Negative for dysuria.  Musculoskeletal: Positive for joint swelling and arthralgias.  Skin: Negative.  Negative for rash.  Neurological: Negative.  Negative for headaches.  Hematological: Negative.  Does not bruise/bleed easily.  Psychiatric/Behavioral: Negative for dysphoric mood. The patient is nervous/anxious.     Past Medical History  Diagnosis Date  . CAD (coronary artery disease)   . Hypertension   . Hyperlipidemia   . GERD (gastroesophageal reflux disease)   . COPD (chronic obstructive pulmonary disease)   . CRF (chronic renal failure)   . Barrett's esophagus   . OP (osteoporosis)      Family History  Problem Relation Age of Onset  . Leukemia Father   . Alzheimer's disease Mother   . Lung cancer Son 39  .  Heart disease Father   . Arthritis Mother      History   Social History  . Marital Status: Widowed    Spouse Name: N/A    Number of Children: N/A  . Years of Education: N/A   Occupational History  . RETIRED [employer]   Social History Main Topics  . Smoking status: Former Smoker -- 0.3 packs/day for 15 years    Types: Cigarettes    Quit date: 05/07/1977  . Smokeless tobacco: Not on file  . Alcohol Use: No  . Drug Use: No  . Sexually Active: Not on file   Other Topics Concern  . Not on file   Social History Narrative  . No narrative on file     No Known Allergies   Outpatient Prescriptions Prior to Visit  Medication Sig Dispense Refill  . albuterol-ipratropium (COMBIVENT) 18-103 MCG/ACT inhaler Inhale 2 puffs into the lungs 2 (two) times daily.       Marland Kitchen aspirin 325 MG tablet Take 325 mg by mouth daily.        Marland Kitchen atorvastatin (LIPITOR) 20 MG tablet Take 20 mg by mouth daily.        Marland Kitchen losartan (COZAAR) 50 MG tablet Take 50 mg by mouth daily.        . Omega-3 Fatty Acids (FISH OIL) 1000 MG CAPS Take 1 capsule by mouth daily.       . predniSONE (DELTASONE) 5 MG tablet Take 5 mg by mouth daily.        . ranitidine (ZANTAC) 150 MG capsule Take 150  mg by mouth 2 (two) times daily.       Marland Kitchen tiotropium (SPIRIVA) 18 MCG inhalation capsule Place 18 mcg into inhaler and inhale daily.        . vitamin E 1000 UNIT capsule Take 1,000 Units by mouth daily.        . Fluticasone-Salmeterol (ADVAIR DISKUS) 250-50 MCG/DOSE AEPB Inhale 1 puff into the lungs every 12 (twelve) hours.        . furosemide (LASIX) 40 MG tablet Take 40 mg by mouth daily.        . mometasone (NASONEX) 50 MCG/ACT nasal spray Place 2 sprays into the nose daily.        . potassium chloride (KLOR-CON) 10 MEQ CR tablet Take 10 mEq by mouth daily.        . Vitamin D, Ergocalciferol, (DRISDOL) 50000 UNITS CAPS Take 50,000 Units by mouth every 7 (seven) days.              Objective:   Physical Exam  Gen:  Pleasant, overwt, in no distress,  normal affect  ENT: No lesions,  mouth clear,  oropharynx clear, no postnasal drip  Neck: No JVD, no TMG, no carotid bruits  Lungs: No use of accessory muscles, some coarse exp breath sounds, wheeze on forced expiration  Cardiovascular: RRR, heart sounds normal, no murmur or gallops, no peripheral edema  Musculoskeletal: No deformities, no cyanosis or clubbing  Neuro: alert, non focal  Skin: Warm, no lesions or rashes     Assessment & Plan:  COPD (chronic obstructive pulmonary disease) - continue same Advair + Spiriva - walking oximetry today - CXR today - follow in 4 months or prn

## 2011-04-27 ENCOUNTER — Telehealth: Payer: Self-pay | Admitting: Emergency Medicine

## 2011-04-27 ENCOUNTER — Other Ambulatory Visit: Payer: Self-pay | Admitting: Emergency Medicine

## 2011-04-27 NOTE — Telephone Encounter (Signed)
Please ask him to finish his prednisone. He does not ned any more levaquin at this time.

## 2011-04-27 NOTE — Telephone Encounter (Signed)
Pharmacy notified no refills on levaquin. Pt also notified.

## 2011-04-27 NOTE — Telephone Encounter (Signed)
C/o productive cough with thick white/green mucus last night. Pt requesting refill on the Levaquin 500mg  once daily if RB feels it necessary.  Original order was from Dr. Su Hilt. Please advise, thanks. Pt aware of RB out of office until this PM.

## 2011-04-27 NOTE — Telephone Encounter (Signed)
Pt c/o increased sob, cough with white or yellow mucus. He says this had cleared up after last round of levaquin but it is worse now after weather changes this week. He denies any sore throat or fever, no head congestion or chest tightness. Pls advise.

## 2011-05-31 ENCOUNTER — Other Ambulatory Visit: Payer: Self-pay | Admitting: Cardiology

## 2011-05-31 ENCOUNTER — Encounter (HOSPITAL_COMMUNITY): Payer: Self-pay | Admitting: Respiratory Therapy

## 2011-06-06 ENCOUNTER — Encounter (HOSPITAL_COMMUNITY)
Admission: RE | Disposition: A | Discharge status: Home / Self Care | Payer: Self-pay | Source: Ambulatory Visit | Attending: Cardiology

## 2011-06-06 ENCOUNTER — Ambulatory Visit (HOSPITAL_COMMUNITY)
Admission: RE | Admit: 2011-06-06 | Discharge: 2011-06-06 | Disposition: A | Discharge status: Home / Self Care | Payer: Medicare Other | Source: Ambulatory Visit | Attending: Cardiology | Admitting: Cardiology

## 2011-06-06 DIAGNOSIS — J449 Chronic obstructive pulmonary disease, unspecified: Secondary | ICD-10-CM | POA: Insufficient documentation

## 2011-06-06 DIAGNOSIS — I10 Essential (primary) hypertension: Secondary | ICD-10-CM | POA: Insufficient documentation

## 2011-06-06 DIAGNOSIS — J4489 Other specified chronic obstructive pulmonary disease: Secondary | ICD-10-CM | POA: Insufficient documentation

## 2011-06-06 DIAGNOSIS — R0609 Other forms of dyspnea: Secondary | ICD-10-CM | POA: Insufficient documentation

## 2011-06-06 DIAGNOSIS — R0989 Other specified symptoms and signs involving the circulatory and respiratory systems: Secondary | ICD-10-CM | POA: Insufficient documentation

## 2011-06-06 DIAGNOSIS — Z951 Presence of aortocoronary bypass graft: Secondary | ICD-10-CM | POA: Insufficient documentation

## 2011-06-06 DIAGNOSIS — E785 Hyperlipidemia, unspecified: Secondary | ICD-10-CM | POA: Insufficient documentation

## 2011-06-06 DIAGNOSIS — I2589 Other forms of chronic ischemic heart disease: Secondary | ICD-10-CM | POA: Insufficient documentation

## 2011-06-06 DIAGNOSIS — I2510 Atherosclerotic heart disease of native coronary artery without angina pectoris: Secondary | ICD-10-CM | POA: Insufficient documentation

## 2011-06-06 HISTORY — PX: CARDIAC CATHETERIZATION: SHX172

## 2011-06-06 HISTORY — PX: LEFT HEART CATHETERIZATION WITH CORONARY/GRAFT ANGIOGRAM: SHX5450

## 2011-06-06 SURGERY — LEFT HEART CATHETERIZATION WITH CORONARY/GRAFT ANGIOGRAM
Anesthesia: LOCAL

## 2011-06-06 MED ORDER — SODIUM CHLORIDE 0.9 % IJ SOLN: 3.0000 mL | INTRAMUSCULAR | Status: DC | PRN

## 2011-06-06 MED ORDER — LOSARTAN POTASSIUM 50 MG PO TABS: 50.0000 mg | ORAL_TABLET | Freq: Every day | ORAL | Status: DC

## 2011-06-06 MED ORDER — SODIUM CHLORIDE 0.9 % IV SOLN
INTRAVENOUS | Status: DC
  Administered 2011-06-06: 09:00:00 via INTRAVENOUS

## 2011-06-06 MED ORDER — SODIUM CHLORIDE 0.9 % IV SOLN: 1.0000 mL/kg/h | INTRAVENOUS | Status: DC

## 2011-06-06 MED ORDER — METOPROLOL SUCCINATE ER 50 MG PO TB24: 50.0000 mg | ORAL_TABLET | Freq: Every day | ORAL | Status: DC

## 2011-06-06 MED ORDER — MORPHINE SULFATE 4 MG/ML IJ SOLN: 1.0000 mg | INTRAMUSCULAR | Status: DC | PRN

## 2011-06-06 MED ORDER — ONDANSETRON HCL 4 MG/2ML IJ SOLN: 4.0000 mg | Freq: Four times a day (QID) | INTRAMUSCULAR | Status: DC | PRN

## 2011-06-06 MED ORDER — LIDOCAINE HCL (PF) 1 % IJ SOLN
INTRAMUSCULAR | Status: AC
  Filled 2011-06-06: qty 30

## 2011-06-06 MED ORDER — TIOTROPIUM BROMIDE MONOHYDRATE 18 MCG IN CAPS: 18.0000 ug | ORAL_CAPSULE | Freq: Every day | RESPIRATORY_TRACT | Status: DC

## 2011-06-06 MED ORDER — VITAMIN D (ERGOCALCIFEROL) 1.25 MG (50000 UNIT) PO CAPS: 50000.0000 [IU] | ORAL_CAPSULE | ORAL | Status: DC

## 2011-06-06 MED ORDER — FAMOTIDINE 20 MG PO TABS: 20.0000 mg | ORAL_TABLET | Freq: Every day | ORAL | Status: DC

## 2011-06-06 MED ORDER — IPRATROPIUM-ALBUTEROL 18-103 MCG/ACT IN AERO: 2.0000 | INHALATION_SPRAY | Freq: Two times a day (BID) | RESPIRATORY_TRACT | Status: DC

## 2011-06-06 MED ORDER — ASPIRIN 325 MG PO TABS: 325.0000 mg | ORAL_TABLET | Freq: Every day | ORAL | Status: DC

## 2011-06-06 MED ORDER — ACETAMINOPHEN 325 MG PO TABS: 650.0000 mg | ORAL_TABLET | ORAL | Status: DC | PRN

## 2011-06-06 MED ORDER — LORATADINE 10 MG PO TABS: 10.0000 mg | ORAL_TABLET | Freq: Every day | ORAL | Status: DC

## 2011-06-06 MED ORDER — HEPARIN (PORCINE) IN NACL 2-0.9 UNIT/ML-% IJ SOLN
INTRAMUSCULAR | Status: AC
  Filled 2011-06-06: qty 2000

## 2011-06-06 MED ORDER — NITROGLYCERIN 0.2 MG/ML ON CALL CATH LAB
INTRAVENOUS | Status: AC
  Filled 2011-06-06: qty 1

## 2011-06-06 MED ORDER — FENTANYL CITRATE 0.05 MG/ML IJ SOLN
INTRAMUSCULAR | Status: AC
  Filled 2011-06-06: qty 2

## 2011-06-06 MED ORDER — PREDNISONE 5 MG PO TABS: 5.0000 mg | ORAL_TABLET | Freq: Every day | ORAL | Status: DC

## 2011-06-06 MED ORDER — FLUTICASONE-SALMETEROL 500-50 MCG/DOSE IN AEPB: 1.0000 | INHALATION_SPRAY | Freq: Two times a day (BID) | RESPIRATORY_TRACT | Status: DC

## 2011-06-06 MED ORDER — GUAIFENESIN ER 600 MG PO TB12: 1200.0000 mg | ORAL_TABLET | Freq: Every day | ORAL | Status: DC

## 2011-06-06 MED ORDER — FUROSEMIDE 10 MG/ML IJ SOLN
INTRAMUSCULAR | Status: AC
  Filled 2011-06-06: qty 4

## 2011-06-06 MED ORDER — FUROSEMIDE 10 MG/ML IJ SOLN
40.0000 mg | Freq: Once | INTRAMUSCULAR | Status: AC
  Administered 2011-06-06: 40 mg via INTRAVENOUS

## 2011-06-06 MED ORDER — SILODOSIN 8 MG PO CAPS: 8.0000 mg | ORAL_CAPSULE | Freq: Every day | ORAL | Status: DC

## 2011-06-06 MED ORDER — MIDAZOLAM HCL 2 MG/2ML IJ SOLN
INTRAMUSCULAR | Status: AC
  Filled 2011-06-06: qty 2

## 2011-06-06 NOTE — H&P (Signed)
History and Physical Interval Note:  NAME:  Richard Chavez   MRN: [id number]  DOB:  [birth year]   ADMIT DATE: 06/06/2011   06/06/2011 11:10 AM  [name] is a 78 y.o. male with history of coronary disease this was Is 71 with SVG-RCA, SVG-circumflex, SVG-RCA. These grafts were documented to be patent as of February 2012. However a preoperative stress test on drainage as the 22nd 2013 [place] mild/moderate ischemia in the apical mid anterolateral and apical lateral region as well as mild diffuse defect in the basal mid inferior region and that is a high risk. His EF is 35-40% by echocardiogram as of October 2011. He does have dyspnea on exertion and occasional chest discomfort that sometimes that the reason has is with activity but he says this is more due to surgical scars and deep seeded him in internal chest discomfort. However due to his history and stress test positivity these referred for cardiac apposition.  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 He also has is that of hypertension dyslipidemia COPD.   Mr. Kunert has presented today for surgery, with the diagnosis of chest pain, shortness of breath with abnormal stress test. The various methods of treatment have been discussed with the patient and family as noted below.. After consideration of risks, benefits and other options for treatment, the patient has consented to Procedure(s):   LEFT HEART CATHETERIZATION AND CORONARY ANGIOGRAPHY +/- AD HOC PERCUTANEOUS CORONARY INTERVENTION  SVG and LIMA angiography  --  as a surgical intervention.   The patients' history has been reviewed, patient examined, no change in status from my most recent note dated 05/31/2011, stable for surgery. I have reviewed the patients' chart and labs. Questions were answered to the patient's satisfaction.   The procedure with Risks/Benefits/Alternatives and Indications was reviewed with the patient.  All questions  were answered.    Risks / Complications include, but not limited to: Death, MI, CVA/TIA, VF/VT (with defibrillation), Bradycardia (need for temporary pacer placement), contrast induced nephropathy, bleeding / bruising / hematoma / pseudoaneurysm, vascular or coronary injury (with possible emergent CT or Vascular Surgery), adverse medication reactions, infection.  The increased risk stroke due to graft angiography as well as the increased risk of graft intervention were discussed.  The patient voices understanding and agree to proceed.   I have signed the consent form and placed it on the chart for patient signature and RN witness.     Marykay Lex, M.D., M.S. [facility] [address] [place], [place]  [address]  [phone]  06/06/2011 11:15 AM    HARDING,[name] W [facility] [address] [place], [place]  [address]  [phone]  06/06/2011 11:10 AM

## 2011-06-06 NOTE — Brief Op Note (Signed)
06/06/2011  12:24 PM  PATIENT:  Richard Chavez  78 y.o. male male with history of coronary disease this was Is 43 with SVG-RCA, SVG-circumflex, SVG-RCA. These grafts were documented to be patent as of February 2012. However a preoperative stress test on drainage as the 22nd 2013 [place] mild/moderate ischemia in the apical mid anterolateral and apical lateral region as well as mild diffuse defect in the basal mid inferior region and that is a high risk. His EF is 35-40% by echocardiogram as of October 2011. He does have dyspnea on exertion and occasional chest discomfort that sometimes that the reason has is with activity but he says this is more due to surgical scars and deep seeded him in internal chest discomfort. However due to his history and stress test positivity these referred for cardiac apposition.  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 He also has is that of hypertension dyslipidemia COPD.  PRE-OPERATIVE DIAGNOSIS:  Chest pain, shortness of breath. Positive nuclear stress test  POST-OPERATIVE DIAGNOSIS:   Severe native coronary disease with an occluded proximal RCA, ostial LAD and mid Circumflex after OM 1.  Widely patent vein grafts to the Left Circumflex system (sequential to OM 2 and 3) and RCA system (RB marginal retrograde filling to second-order marginal then down to RPDA with retrograde filling into a small RPL system diffusely diseased).  Widely patent LIMA and LAD with brisk distal distal flow antegrade, retrograde flow back fills to a major Diagonal and several small possible septal perforating arteries are diffusely diseased proximal/mid LAD.  No evidence of the lesion to explain ischemia other than mild peri-infarct ischemia in different infarct distribution.  Severe Ischemic Cardiomyopathy -- EF and by echo estimated to be 30-35% which is seen to be worse than the recorded 35-40% by echocardiogram October 2011  Normal  LVEDP.  Procedure(s):  LEFT HEART CATHETERIZATION WITH CORONARY/GRAFT ANGIOGRAM  Left ventriculography  Saphenous Vein Graft Angiography x2  Left Internal Mammary Artery Angiography  Surgeon(s): Marykay Lex, MD  ANESTHESIA:   local and IV sedation; 1 mg Versed, Fentanyl  EBL:    less than 10 mL  Equipment: 5 French Right Common Femoral Artery access, 5 [place] for native left system, 5 [place] JR 4 SVG-OM, 5 French RCB-SVG to RCA, LIMA catheter for LIMA graft   LOCAL MEDICATIONS USED:  LIDOCAINE 18 ml  DICTATION: .Note written in paper chart and Note written in EPIC  PLAN OF CARE: Discharge to home after PACU  Optimize medical management of ischemic cardiopathy.  He will followup with me at the [facility].   Interrogate pacemaker for unusual rhythm changes during cath.  His weakness may very well be do to rhythm changes  Recheck echocardiogram to better assess EF as patient may warrant evaluation for ICD  PATIENT DISPOSITION:  PACU - hemodynamically stable.   Results were discussed with patient and family.  Report referred to Dr. Aida Puffer, his primary physician.  Marykay Lex, M.D., M.S. [facility] [address] [place], [place]  [address]  [phone]  06/06/2011 12:33 PM

## 2011-06-30 ENCOUNTER — Emergency Department (HOSPITAL_COMMUNITY): Payer: Medicare Other

## 2011-06-30 ENCOUNTER — Emergency Department (HOSPITAL_COMMUNITY)
Admission: EM | Admit: 2011-06-30 | Discharge: 2011-06-30 | Disposition: A | Discharge status: Home Health | Payer: Medicare Other | Attending: Emergency Medicine | Admitting: Emergency Medicine

## 2011-06-30 ENCOUNTER — Encounter (HOSPITAL_COMMUNITY): Payer: Self-pay | Admitting: *Deleted

## 2011-06-30 ENCOUNTER — Other Ambulatory Visit: Payer: Self-pay

## 2011-06-30 DIAGNOSIS — N189 Chronic kidney disease, unspecified: Secondary | ICD-10-CM | POA: Insufficient documentation

## 2011-06-30 DIAGNOSIS — R110 Nausea: Secondary | ICD-10-CM

## 2011-06-30 DIAGNOSIS — R0602 Shortness of breath: Secondary | ICD-10-CM | POA: Insufficient documentation

## 2011-06-30 DIAGNOSIS — R112 Nausea with vomiting, unspecified: Secondary | ICD-10-CM | POA: Insufficient documentation

## 2011-06-30 DIAGNOSIS — I129 Hypertensive chronic kidney disease with stage 1 through stage 4 chronic kidney disease, or unspecified chronic kidney disease: Secondary | ICD-10-CM | POA: Insufficient documentation

## 2011-06-30 DIAGNOSIS — I2510 Atherosclerotic heart disease of native coronary artery without angina pectoris: Secondary | ICD-10-CM | POA: Insufficient documentation

## 2011-06-30 DIAGNOSIS — R143 Flatulence: Secondary | ICD-10-CM | POA: Insufficient documentation

## 2011-06-30 DIAGNOSIS — Z79899 Other long term (current) drug therapy: Secondary | ICD-10-CM | POA: Insufficient documentation

## 2011-06-30 DIAGNOSIS — R109 Unspecified abdominal pain: Secondary | ICD-10-CM | POA: Insufficient documentation

## 2011-06-30 DIAGNOSIS — Z950 Presence of cardiac pacemaker: Secondary | ICD-10-CM | POA: Insufficient documentation

## 2011-06-30 DIAGNOSIS — Z7982 Long term (current) use of aspirin: Secondary | ICD-10-CM | POA: Insufficient documentation

## 2011-06-30 DIAGNOSIS — R141 Gas pain: Secondary | ICD-10-CM | POA: Insufficient documentation

## 2011-06-30 DIAGNOSIS — J449 Chronic obstructive pulmonary disease, unspecified: Secondary | ICD-10-CM | POA: Insufficient documentation

## 2011-06-30 DIAGNOSIS — J4489 Other specified chronic obstructive pulmonary disease: Secondary | ICD-10-CM | POA: Insufficient documentation

## 2011-06-30 DIAGNOSIS — K219 Gastro-esophageal reflux disease without esophagitis: Secondary | ICD-10-CM | POA: Insufficient documentation

## 2011-06-30 DIAGNOSIS — R142 Eructation: Secondary | ICD-10-CM | POA: Insufficient documentation

## 2011-06-30 DIAGNOSIS — E785 Hyperlipidemia, unspecified: Secondary | ICD-10-CM | POA: Insufficient documentation

## 2011-06-30 DIAGNOSIS — M810 Age-related osteoporosis without current pathological fracture: Secondary | ICD-10-CM | POA: Insufficient documentation

## 2011-06-30 DIAGNOSIS — Z951 Presence of aortocoronary bypass graft: Secondary | ICD-10-CM | POA: Insufficient documentation

## 2011-06-30 LAB — CBC
HCT: 39.4 % (ref 39.0–52.0)
MCH: 30.8 pg (ref 26.0–34.0)
MCV: 86.6 fL (ref 78.0–100.0)
RDW: 14.4 % (ref 11.5–15.5)
WBC: 7.2 10*3/uL (ref 4.0–10.5)

## 2011-06-30 LAB — COMPREHENSIVE METABOLIC PANEL
Albumin: 2.8 g/dL — ABNORMAL LOW (ref 3.5–5.2)
BUN: 9 mg/dL (ref 6–23)
CO2: 18 mEq/L — ABNORMAL LOW (ref 19–32)
Calcium: 8.9 mg/dL (ref 8.4–10.5)
Chloride: 90 mEq/L — ABNORMAL LOW (ref 96–112)
Creatinine, Ser: 0.98 mg/dL (ref 0.50–1.35)
GFR calc non Af Amer: 74 mL/min — ABNORMAL LOW (ref >90)
Total Bilirubin: 1.4 mg/dL — ABNORMAL HIGH (ref 0.3–1.2)

## 2011-06-30 LAB — TROPONIN I: Troponin I: <0.3 ng/mL (ref <0.30)

## 2011-06-30 LAB — LIPASE, BLOOD: Lipase: 22 U/L (ref 11–59)

## 2011-06-30 MED ORDER — ONDANSETRON 8 MG PO TBDP: 8.0000 mg | ORAL_TABLET | Freq: Three times a day (TID) | ORAL | Status: DC | PRN

## 2011-06-30 MED ORDER — IOHEXOL 300 MG/ML  SOLN
100.0000 mL | Freq: Once | INTRAMUSCULAR | Status: AC | PRN
  Administered 2011-06-30: 100 mL via INTRAVENOUS

## 2011-06-30 MED ORDER — ONDANSETRON HCL 4 MG/2ML IJ SOLN
4.0000 mg | Freq: Once | INTRAMUSCULAR | Status: AC
  Administered 2011-06-30: 4 mg via INTRAVENOUS
  Filled 2011-06-30: qty 2

## 2011-06-30 MED ORDER — SODIUM CHLORIDE 0.9 % IV BOLUS (SEPSIS)
500.0000 mL | Freq: Once | INTRAVENOUS | Status: AC
  Administered 2011-06-30: 500 mL via INTRAVENOUS

## 2011-06-30 MED ORDER — IOHEXOL 300 MG/ML  SOLN
20.0000 mL | INTRAMUSCULAR | Status: AC
  Administered 2011-06-30: 20 mL via ORAL

## 2011-06-30 NOTE — ED Notes (Signed)
Notified CT that pt competed contrast and is ready for transport. Will continue to monitor.

## 2011-06-30 NOTE — ED Notes (Signed)
EDP made aware of outcome from ambulation and fluid challenge. Will continue to monitor.

## 2011-06-30 NOTE — ED Notes (Signed)
Ambulated pt around unit. There was no respiratory distress or cardiac distress from ambulation. No complaints. Vitals were WNL. Pt also completed fluid challenge with no problems. Will continue to monitor.

## 2011-06-30 NOTE — ED Notes (Signed)
Pt presents to department for evaluation of diffuse abdominal pain and vomiting. Pt states symptoms ongoing x3 days. States "I feel sick and I don't know what's going on." pt states he recently had cardiac cath, but unsure about results. Denies chest pain. Respirations unlabored. Skin warm and dry. Abdomen soft and nontender to palpation. Pt states recent constipation. Bilateral 2+ pitting lower leg edema. Expiratory wheezing noted bilaterally. No signs of distress at the time. Pt conscious alert and oriented x4.

## 2011-06-30 NOTE — ED Notes (Signed)
Lab at the bedside attempting to draw.

## 2011-06-30 NOTE — ED Notes (Addendum)
Pt came in today because he has been having abdominal pain starting earlier today. Pain is upper abdomen and is sharp pain. Vomiting with pain. Pt stated that he currently does not have any pain. Abdomen is distended and bowel sounds are present. Pt also complaining of BLE edema, which is present and increased SOB with exertion. Pt states that he normally has SOB, but today it has been worst. Currently not in any respiratory distress. Will continue to monitor.

## 2011-06-30 NOTE — ED Notes (Signed)
Pt very vague with symptoms. Reports having generalized abd pain and vomiting all day, denies diarrhea. No acute distress noted at this time.

## 2011-06-30 NOTE — ED Provider Notes (Signed)
History     CSN: [id number]  Arrival date & time 06/30/11  1629   First MD Initiated Contact with Patient 06/30/11 1748      Chief Complaint  Patient presents with  . Abdominal Pain  . Emesis     Patient is a 78 y.o. male presenting with abdominal pain and vomiting.  Abdominal Pain The primary symptoms of the illness include abdominal pain and vomiting.  Emesis  Associated symptoms include abdominal pain.   The patient reports approximately 12-16 hours of nausea without vomiting.  He denies diarrhea.  He denies fevers and chills.  He denies flank pain and dysuria.  He reports no chest pain or shortness of breath.  He's had no dyspnea on exertion.  He's had no recent sick contacts.  Nothing worsens the symptoms.  Nothing improves his symptoms.  Symptoms are constant. Past Medical History  Diagnosis Date  . CAD (coronary artery disease)   . Hypertension   . Hyperlipidemia   . GERD (gastroesophageal reflux disease)   . COPD (chronic obstructive pulmonary disease)   . CRF (chronic renal failure)   . Barrett's esophagus   . OP (osteoporosis)     Past Surgical History  Procedure Date  . Pacemaker placement 2007  . Cholecystectomy 2005  . Coronary artery bypass graft 1978    Family History  Problem Relation Age of Onset  . Leukemia Father   . Alzheimer's disease Mother   . Lung cancer Son 70  . Heart disease Father   . Arthritis Mother     History  Substance Use Topics  . Smoking status: Former Smoker -- 0.3 packs/day for 15 years    Types: Cigarettes    Quit date: 05/07/1977  . Smokeless tobacco: Not on file  . Alcohol Use: No      Review of Systems  Gastrointestinal: Positive for vomiting and abdominal pain.  All other systems reviewed and are negative.    Allergies  Review of patient's allergies indicates no known allergies.  Home Medications   Current Outpatient Rx  Name Route Sig Dispense Refill  . IPRATROPIUM-ALBUTEROL 18-103 MCG/ACT IN AERO  Inhalation Inhale 2 puffs into the lungs 2 (two) times daily.     . ASPIRIN 325 MG PO TABS Oral Take 325 mg by mouth daily.      . ATORVASTATIN CALCIUM 20 MG PO TABS Oral Take 20 mg by mouth daily.      Marland Kitchen FLUTICASONE PROPIONATE 50 MCG/ACT NA SUSP  2 sprays in each nostril twice daily    . FLUTICASONE-SALMETEROL 500-50 MCG/DOSE IN AEPB Inhalation Inhale 1 puff into the lungs every 12 (twelve) hours.      . FUROSEMIDE 40 MG PO TABS Oral Take 40 mg by mouth daily.    Marland Kitchen GABAPENTIN 300 MG PO CAPS Oral Take 300 mg by mouth 3 (three) times daily.     . GUAIFENESIN ER 600 MG PO TB12 Oral Take 1,200 mg by mouth daily.      Marland Kitchen LORATADINE 10 MG PO TABS Oral Take 10 mg by mouth daily.    Marland Kitchen LOSARTAN POTASSIUM 50 MG PO TABS Oral Take 50 mg by mouth daily.      Marland Kitchen MAGNESIUM CHLORIDE ER 535 (64 MG) MG PO TBCR Oral Take 1 tablet by mouth 2 (two) times daily.      Marland Kitchen METOPROLOL SUCCINATE ER 50 MG PO TB24 Oral Take 50 mg by mouth daily. Take with or immediately following a meal.    .  FISH OIL 1000 MG PO CAPS Oral Take 1 capsule by mouth daily.     Marland Kitchen POTASSIUM CHLORIDE CRYS ER 20 MEQ PO TBCR Oral Take 20 mEq by mouth daily.    Marland Kitchen PREDNISONE 5 MG PO TABS Oral Take 5 mg by mouth daily.      Marland Kitchen RANITIDINE HCL 150 MG PO CAPS Oral Take 150 mg by mouth 2 (two) times daily.     Marland Kitchen RAPAFLO 8 MG PO CAPS Oral Take 8 mg by mouth daily with breakfast. Once daily    . TIOTROPIUM BROMIDE MONOHYDRATE 18 MCG IN CAPS Inhalation Place 18 mcg into inhaler and inhale daily.     Marland Kitchen VITAMIN D (ERGOCALCIFEROL) 50000 UNITS PO CAPS Oral Take 50,000 Units by mouth every 7 (seven) days. Take on mondays    . VITAMIN E 1000 UNITS PO CAPS Oral Take 1,000 Units by mouth daily.      Marland Kitchen ONDANSETRON 8 MG PO TBDP Oral Take 1 tablet (8 mg total) by mouth every 8 (eight) hours as needed for nausea. 10 tablet 0    BP 134/76  Pulse 85  Temp(Src) 98.5 F (36.9 C) (Oral)  Resp 20  SpO2 96%  Physical Exam  Nursing note and vitals  reviewed. Constitutional: He is oriented to person, place, and time. He appears well-developed and well-nourished.  HENT:  Head: Normocephalic and atraumatic.  Eyes: EOM are normal.  Neck: Normal range of motion.  Cardiovascular: Normal rate, regular rhythm, normal heart sounds and intact distal pulses.   Pulmonary/Chest: Effort normal and breath sounds normal. No respiratory distress.  Abdominal: Soft. He exhibits no distension. There is no tenderness.  Musculoskeletal: Normal range of motion.  Neurological: He is alert and oriented to person, place, and time.  Skin: Skin is warm and dry.  Psychiatric: He has a normal mood and affect. Judgment normal.    ED Course  Procedures (including critical care time)  Labs Reviewed  COMPREHENSIVE METABOLIC PANEL - Abnormal; Notable for the following:    Sodium 123 (*)    Chloride 90 (*)    CO2 18 (*)    Glucose, Bld 66 (*)    Albumin 2.8 (*)    Total Bilirubin 1.4 (*)    GFR calc non Af Amer 74 (*)    GFR calc Af Amer 86 (*)    All other components within normal limits  CBC  LIPASE, BLOOD  TROPONIN I   Ct Abdomen Pelvis W Contrast  06/30/2011  *RADIOLOGY REPORT*  Clinical Data: Upper abdominal pain, nausea, shortness of breath.  CT ABDOMEN AND PELVIS WITH CONTRAST  Technique:  Multidetector CT imaging of the abdomen and pelvis was performed following the standard protocol during bolus administration of intravenous contrast.  Contrast: OMNIPAQUE IOHEXOL 300 MG/ML IV SOLN  Comparison: Plain films 06/30/2011  Findings: Areas of scarring/fibrosis in the lung bases.  Calcified pleural plaques in the right lung base.  Mild cardiomegaly.  Pacer wires noted in the right heart.  No effusions.  Prior cholecystectomy.  Liver, spleen, pancreas, adrenals and kidneys are unremarkable.  Aorta and iliac vessels are heavily calcified, non-aneurysmal.  Prostate is enlarged.  Urinary bladder is grossly unremarkable.  Descending colonic and sigmoid  diverticulosis.  No active diverticulitis.  Small bowel is decompressed, grossly unremarkable. No free fluid, free air or adenopathy.  No acute bony abnormality.  IMPRESSION: Fibrosis and scarring in the lung bases.  Calcified pleural plaques in the right lung base.  Descending colonic and sigmoid  diverticulosis.  No acute findings in the abdomen or pelvis.  Original Report Authenticated By: Cyndie Chime, M.D.   Dg Abd Acute W/chest  06/30/2011  *RADIOLOGY REPORT*  Clinical Data: Abdominal pain and distention.  Nausea and vomiting. Former smoker with history of COPD.  Prior CABG.  ACUTE ABDOMEN SERIES (ABDOMEN 2 VIEW & CHEST 1 VIEW) 06/30/2011:  Comparison: Two-view chest x-ray 04/26/2011 [facility] and 06/15/2008 [facility].  No prior abdominal imaging.  Findings: Gas within the upper normal caliber colon, with gas and stool noted to the rectum.  Gas within multiple upper normal size to small bowel loops diffusely throughout the abdomen.  No evidence of free air on the erect image.  Air-fluid levels in the colon consistent with liquid stool.  No small bowel air-fluid levels. Aorto-iliofemoral atherosclerosis without a visible aneurysm. Visible psoas margins.  No visible opaque urinary tract calculi. Degenerative changes involving the lower lumbar spine.  Prior sternotomy for CABG.  Cardiac silhouette moderately enlarged but stable.  Left subclavian dual lead transvenous pacemaker unchanged and intact.  Interval development of diffuse interstitial pulmonary opacities since the 04/2011 examination.  Small bilateral pleural effusions.  IMPRESSION:  1.  Mild generalized ileus and/or enterocolitis.  No evidence of bowel obstruction or free intraperitoneal air. 2.  Stable cardiomegaly.  Diffuse interstitial pulmonary edema and small bilateral pleural effusions is consistent with mild CHF.  Original Report Authenticated By: Arnell Sieving, M.D.   I personally reviewed the patient's  imaging studies in the ER  1. Nausea   2. Abdominal pain       MDM  The patient with nausea and nonspecific abdominal cramping.  His had no diarrhea.  His initial plain films suggested an ileus and thus a CT scan was obtained.  CT scans without any acute abdominal pathology.  The patient has been walking around the ER he has no complaints.  He's tolerating oral fluids.  He feels much better.  He is noted to be hyponatremic and hypochloremic consistent with dehydration.  He doesn't have generalized weakness at this time.  He's been given IV fluids in the ER and is taking oral fluids in the ER.  He understands the importance of continued oral hydration at home.  He will followup with his Dr. on Monday for repeat evaluation.  He understands to return to the ER for new or worsening symptoms.  The patient is very interested in  returning home tonight        Lyanne Co, MD 06/30/11 2256

## 2011-07-04 ENCOUNTER — Inpatient Hospital Stay (HOSPITAL_COMMUNITY)
Admission: EM | Admit: 2011-07-04 | Discharge: 2011-07-07 | DRG: 292 | Disposition: A | Discharge status: Home / Self Care | Payer: Medicare Other | Attending: Internal Medicine | Admitting: Internal Medicine

## 2011-07-04 ENCOUNTER — Emergency Department (HOSPITAL_COMMUNITY): Payer: Medicare Other

## 2011-07-04 ENCOUNTER — Inpatient Hospital Stay (HOSPITAL_COMMUNITY): Payer: Medicare Other

## 2011-07-04 ENCOUNTER — Other Ambulatory Visit: Payer: Self-pay

## 2011-07-04 ENCOUNTER — Encounter (HOSPITAL_COMMUNITY): Payer: Self-pay | Admitting: Emergency Medicine

## 2011-07-04 DIAGNOSIS — R600 Localized edema: Secondary | ICD-10-CM

## 2011-07-04 DIAGNOSIS — R609 Edema, unspecified: Secondary | ICD-10-CM | POA: Diagnosis present

## 2011-07-04 DIAGNOSIS — I219 Acute myocardial infarction, unspecified: Secondary | ICD-10-CM

## 2011-07-04 DIAGNOSIS — R55 Syncope and collapse: Secondary | ICD-10-CM

## 2011-07-04 DIAGNOSIS — N189 Chronic kidney disease, unspecified: Secondary | ICD-10-CM | POA: Diagnosis present

## 2011-07-04 DIAGNOSIS — I25810 Atherosclerosis of coronary artery bypass graft(s) without angina pectoris: Secondary | ICD-10-CM

## 2011-07-04 DIAGNOSIS — E876 Hypokalemia: Secondary | ICD-10-CM

## 2011-07-04 DIAGNOSIS — Z87891 Personal history of nicotine dependence: Secondary | ICD-10-CM

## 2011-07-04 DIAGNOSIS — I4729 Other ventricular tachycardia: Secondary | ICD-10-CM

## 2011-07-04 DIAGNOSIS — Z7982 Long term (current) use of aspirin: Secondary | ICD-10-CM

## 2011-07-04 DIAGNOSIS — I472 Ventricular tachycardia, unspecified: Secondary | ICD-10-CM | POA: Diagnosis present

## 2011-07-04 DIAGNOSIS — B957 Other staphylococcus as the cause of diseases classified elsewhere: Secondary | ICD-10-CM | POA: Diagnosis present

## 2011-07-04 DIAGNOSIS — K219 Gastro-esophageal reflux disease without esophagitis: Secondary | ICD-10-CM

## 2011-07-04 DIAGNOSIS — N390 Urinary tract infection, site not specified: Secondary | ICD-10-CM

## 2011-07-04 DIAGNOSIS — I2510 Atherosclerotic heart disease of native coronary artery without angina pectoris: Secondary | ICD-10-CM | POA: Diagnosis present

## 2011-07-04 DIAGNOSIS — K2270 Barrett's esophagus without dysplasia: Secondary | ICD-10-CM

## 2011-07-04 DIAGNOSIS — E7800 Pure hypercholesterolemia, unspecified: Secondary | ICD-10-CM

## 2011-07-04 DIAGNOSIS — Z79899 Other long term (current) drug therapy: Secondary | ICD-10-CM

## 2011-07-04 DIAGNOSIS — I255 Ischemic cardiomyopathy: Secondary | ICD-10-CM

## 2011-07-04 DIAGNOSIS — M810 Age-related osteoporosis without current pathological fracture: Secondary | ICD-10-CM | POA: Diagnosis present

## 2011-07-04 DIAGNOSIS — J810 Acute pulmonary edema: Secondary | ICD-10-CM

## 2011-07-04 DIAGNOSIS — I509 Heart failure, unspecified: Principal | ICD-10-CM | POA: Diagnosis present

## 2011-07-04 DIAGNOSIS — E785 Hyperlipidemia, unspecified: Secondary | ICD-10-CM | POA: Diagnosis present

## 2011-07-04 DIAGNOSIS — Z951 Presence of aortocoronary bypass graft: Secondary | ICD-10-CM

## 2011-07-04 DIAGNOSIS — Z950 Presence of cardiac pacemaker: Secondary | ICD-10-CM

## 2011-07-04 DIAGNOSIS — J449 Chronic obstructive pulmonary disease, unspecified: Secondary | ICD-10-CM

## 2011-07-04 DIAGNOSIS — J4489 Other specified chronic obstructive pulmonary disease: Secondary | ICD-10-CM | POA: Diagnosis present

## 2011-07-04 DIAGNOSIS — E871 Hypo-osmolality and hyponatremia: Secondary | ICD-10-CM

## 2011-07-04 DIAGNOSIS — I2589 Other forms of chronic ischemic heart disease: Secondary | ICD-10-CM | POA: Diagnosis present

## 2011-07-04 DIAGNOSIS — I10 Essential (primary) hypertension: Secondary | ICD-10-CM

## 2011-07-04 DIAGNOSIS — I129 Hypertensive chronic kidney disease with stage 1 through stage 4 chronic kidney disease, or unspecified chronic kidney disease: Secondary | ICD-10-CM | POA: Diagnosis present

## 2011-07-04 HISTORY — DX: Presence of cardiac pacemaker: Z95.0

## 2011-07-04 HISTORY — DX: Atherosclerosis of coronary artery bypass graft(s) without angina pectoris: I25.810

## 2011-07-04 HISTORY — DX: Ischemic cardiomyopathy: I25.5

## 2011-07-04 HISTORY — DX: Syncope and collapse: R55

## 2011-07-04 LAB — BASIC METABOLIC PANEL
CO2: 24 mEq/L (ref 19–32)
Calcium: 8.3 mg/dL — ABNORMAL LOW (ref 8.4–10.5)
Chloride: 90 mEq/L — ABNORMAL LOW (ref 96–112)
Creatinine, Ser: 1.19 mg/dL (ref 0.50–1.35)
GFR calc Af Amer: 64 mL/min — ABNORMAL LOW (ref >90)
GFR calc non Af Amer: 62 mL/min — ABNORMAL LOW (ref >90)
Glucose, Bld: 86 mg/dL (ref 70–99)
Potassium: 3.8 mEq/L (ref 3.5–5.1)
Sodium: 123 mEq/L — ABNORMAL LOW (ref 135–145)
Sodium: 124 mEq/L — ABNORMAL LOW (ref 135–145)

## 2011-07-04 LAB — DIFFERENTIAL
Basophils Absolute: 0 10*3/uL (ref 0.0–0.1)
Basophils Relative: 1 % (ref 0–1)
Lymphocytes Relative: 19 % (ref 12–46)
Monocytes Absolute: 0.8 10*3/uL (ref 0.1–1.0)
Neutro Abs: 3.8 10*3/uL (ref 1.7–7.7)
Neutrophils Relative %: 60 % (ref 43–77)

## 2011-07-04 LAB — URINALYSIS, ROUTINE W REFLEX MICROSCOPIC
Hgb urine dipstick: NEGATIVE
Protein, ur: NEGATIVE mg/dL
Specific Gravity, Urine: 1.011 (ref 1.005–1.030)
Urobilinogen, UA: 1 mg/dL (ref 0.0–1.0)

## 2011-07-04 LAB — CARDIAC PANEL(CRET KIN+CKTOT+MB+TROPI)
CK, MB: 3.5 ng/mL (ref 0.3–4.0)
CK, MB: 4.1 ng/mL — ABNORMAL HIGH (ref 0.3–4.0)
Relative Index: INVALID (ref 0.0–2.5)
Relative Index: INVALID (ref 0.0–2.5)
Total CK: 79 U/L (ref 7–232)
Total CK: 77 U/L (ref 7–232)

## 2011-07-04 LAB — HEPATIC FUNCTION PANEL
AST: 20 U/L (ref 0–37)
Albumin: 2.5 g/dL — ABNORMAL LOW (ref 3.5–5.2)
Alkaline Phosphatase: 74 U/L (ref 39–117)
Total Bilirubin: 0.7 mg/dL (ref 0.3–1.2)

## 2011-07-04 LAB — CBC
HCT: 35.8 % — ABNORMAL LOW (ref 39.0–52.0)
Hemoglobin: 12.6 g/dL — ABNORMAL LOW (ref 13.0–17.0)
MCHC: 36.3 g/dL — ABNORMAL HIGH (ref 30.0–36.0)
MCHC: 35.2 g/dL (ref 30.0–36.0)
Platelets: 254 10*3/uL (ref 150–400)
RDW: 14.5 % (ref 11.5–15.5)
WBC: 6.3 10*3/uL (ref 4.0–10.5)

## 2011-07-04 LAB — GLUCOSE, CAPILLARY: Glucose-Capillary: 90 mg/dL (ref 70–99)

## 2011-07-04 LAB — URINE MICROSCOPIC-ADD ON

## 2011-07-04 LAB — OSMOLALITY, URINE: Osmolality, Ur: 260 mOsm/kg — ABNORMAL LOW (ref 390–1090)

## 2011-07-04 LAB — PROTIME-INR: INR: 1.16 (ref 0.00–1.49)

## 2011-07-04 LAB — CREATININE, URINE, RANDOM: Creatinine, Urine: 107.98 mg/dL

## 2011-07-04 LAB — SODIUM, URINE, RANDOM: Sodium, Ur: 27 mEq/L

## 2011-07-04 LAB — MRSA PCR SCREENING: MRSA by PCR: POSITIVE — AB

## 2011-07-04 LAB — MAGNESIUM: Magnesium: 1.7 mg/dL (ref 1.5–2.5)

## 2011-07-04 MED ORDER — SODIUM CHLORIDE 0.9 % IV SOLN: INTRAVENOUS | Status: DC

## 2011-07-04 MED ORDER — TIOTROPIUM BROMIDE MONOHYDRATE 18 MCG IN CAPS
18.0000 ug | ORAL_CAPSULE | Freq: Every day | RESPIRATORY_TRACT | Status: DC
  Administered 2011-07-05 – 2011-07-07 (×3): 18 ug via RESPIRATORY_TRACT
  Filled 2011-07-04: qty 5

## 2011-07-04 MED ORDER — DEXTROSE 5 % IV SOLN
1.0000 g | INTRAVENOUS | Status: DC
  Administered 2011-07-04 – 2011-07-06 (×3): 1 g via INTRAVENOUS
  Filled 2011-07-04 (×3): qty 10

## 2011-07-04 MED ORDER — FLUTICASONE-SALMETEROL 500-50 MCG/DOSE IN AEPB
1.0000 | INHALATION_SPRAY | Freq: Two times a day (BID) | RESPIRATORY_TRACT | Status: DC
  Administered 2011-07-04 – 2011-07-07 (×6): 1 via RESPIRATORY_TRACT
  Filled 2011-07-04: qty 14

## 2011-07-04 MED ORDER — MAGNESIUM CHLORIDE ER 535 (64 MG) MG PO TBCR: 1.0000 | EXTENDED_RELEASE_TABLET | Freq: Two times a day (BID) | ORAL | Status: DC

## 2011-07-04 MED ORDER — FUROSEMIDE 10 MG/ML IJ SOLN: 40.0000 mg | Freq: Once | INTRAMUSCULAR | Status: DC

## 2011-07-04 MED ORDER — DEXTROSE 5 % IV SOLN
1.0000 g | Freq: Once | INTRAVENOUS | Status: AC
  Administered 2011-07-04: 1 g via INTRAVENOUS
  Filled 2011-07-04: qty 10

## 2011-07-04 MED ORDER — SIMVASTATIN 40 MG PO TABS
40.0000 mg | ORAL_TABLET | Freq: Every day | ORAL | Status: DC
  Administered 2011-07-04 – 2011-07-07 (×4): 40 mg via ORAL
  Filled 2011-07-04 (×4): qty 1

## 2011-07-04 MED ORDER — LORATADINE 10 MG PO TABS
10.0000 mg | ORAL_TABLET | Freq: Every day | ORAL | Status: DC
  Administered 2011-07-04 – 2011-07-07 (×4): 10 mg via ORAL
  Filled 2011-07-04 (×4): qty 1

## 2011-07-04 MED ORDER — SILODOSIN 8 MG PO CAPS
8.0000 mg | ORAL_CAPSULE | Freq: Every day | ORAL | Status: DC
Start: 2011-07-05 — End: 2011-07-07
  Administered 2011-07-05 – 2011-07-07 (×3): 8 mg via ORAL
  Filled 2011-07-04 (×4): qty 1

## 2011-07-04 MED ORDER — ASPIRIN 325 MG PO TABS
325.0000 mg | ORAL_TABLET | Freq: Every day | ORAL | Status: DC
  Administered 2011-07-04 – 2011-07-07 (×4): 325 mg via ORAL
  Filled 2011-07-04 (×4): qty 1

## 2011-07-04 MED ORDER — SODIUM CHLORIDE 0.9 % IV BOLUS (SEPSIS)
1000.0000 mL | Freq: Once | INTRAVENOUS | Status: AC
  Administered 2011-07-04: 1000 mL via INTRAVENOUS

## 2011-07-04 MED ORDER — ACETAMINOPHEN 325 MG PO TABS: 650.0000 mg | ORAL_TABLET | Freq: Four times a day (QID) | ORAL | Status: DC | PRN

## 2011-07-04 MED ORDER — FLUTICASONE PROPIONATE 50 MCG/ACT NA SUSP
2.0000 | Freq: Two times a day (BID) | NASAL | Status: DC
  Administered 2011-07-04 – 2011-07-07 (×5): 2 via NASAL
  Filled 2011-07-04: qty 16

## 2011-07-04 MED ORDER — OMEGA-3-ACID ETHYL ESTERS 1 G PO CAPS
1.0000 g | ORAL_CAPSULE | Freq: Every day | ORAL | Status: DC
  Administered 2011-07-04 – 2011-07-07 (×4): 1 g via ORAL
  Filled 2011-07-04 (×4): qty 1

## 2011-07-04 MED ORDER — FUROSEMIDE 10 MG/ML IJ SOLN
40.0000 mg | Freq: Two times a day (BID) | INTRAMUSCULAR | Status: DC
  Administered 2011-07-04 – 2011-07-05 (×3): 40 mg via INTRAVENOUS
  Filled 2011-07-04 (×6): qty 4

## 2011-07-04 MED ORDER — MAGNESIUM CHLORIDE 64 MG PO TBEC
1.0000 | DELAYED_RELEASE_TABLET | Freq: Two times a day (BID) | ORAL | Status: DC
  Administered 2011-07-04 – 2011-07-07 (×6): 64 mg via ORAL
  Filled 2011-07-04 (×9): qty 1

## 2011-07-04 MED ORDER — OXYCODONE HCL 5 MG PO TABS: 5.0000 mg | ORAL_TABLET | ORAL | Status: DC | PRN

## 2011-07-04 MED ORDER — ONDANSETRON HCL 4 MG/2ML IJ SOLN: 4.0000 mg | Freq: Four times a day (QID) | INTRAMUSCULAR | Status: DC | PRN

## 2011-07-04 MED ORDER — MUPIROCIN 2 % EX OINT
1.0000 "application " | TOPICAL_OINTMENT | Freq: Two times a day (BID) | CUTANEOUS | Status: DC
  Filled 2011-07-04: qty 22

## 2011-07-04 MED ORDER — CHLORHEXIDINE GLUCONATE CLOTH 2 % EX PADS: 6.0000 | MEDICATED_PAD | Freq: Every day | CUTANEOUS | Status: DC

## 2011-07-04 MED ORDER — PREDNISONE 20 MG PO TABS: 20.0000 mg | ORAL_TABLET | Freq: Every day | ORAL | Status: DC

## 2011-07-04 MED ORDER — SODIUM CHLORIDE 0.9 % IV BOLUS (SEPSIS)
500.0000 mL | Freq: Once | INTRAVENOUS | Status: AC
  Administered 2011-07-04: 1000 mL via INTRAVENOUS

## 2011-07-04 MED ORDER — POTASSIUM CHLORIDE ER 10 MEQ PO TBCR
10.0000 meq | EXTENDED_RELEASE_TABLET | Freq: Every day | ORAL | Status: DC
  Administered 2011-07-04: 10 meq via ORAL
  Filled 2011-07-04 (×2): qty 1

## 2011-07-04 MED ORDER — ALUM & MAG HYDROXIDE-SIMETH 200-200-20 MG/5ML PO SUSP
30.0000 mL | Freq: Four times a day (QID) | ORAL | Status: DC | PRN
  Administered 2011-07-05: 30 mL via ORAL
  Filled 2011-07-04: qty 30

## 2011-07-04 MED ORDER — MUPIROCIN 2 % EX OINT
1.0000 "application " | TOPICAL_OINTMENT | Freq: Two times a day (BID) | CUTANEOUS | Status: DC
  Administered 2011-07-04 – 2011-07-07 (×5): 1 via NASAL
  Filled 2011-07-04: qty 22

## 2011-07-04 MED ORDER — SODIUM CHLORIDE 0.9 % IJ SOLN
3.0000 mL | Freq: Two times a day (BID) | INTRAMUSCULAR | Status: DC
  Administered 2011-07-04 – 2011-07-07 (×5): 3 mL via INTRAVENOUS

## 2011-07-04 MED ORDER — CHLORHEXIDINE GLUCONATE CLOTH 2 % EX PADS
6.0000 | MEDICATED_PAD | Freq: Every day | CUTANEOUS | Status: DC
  Administered 2011-07-05 – 2011-07-07 (×3): 6 via TOPICAL

## 2011-07-04 MED ORDER — ENOXAPARIN SODIUM 40 MG/0.4ML ~~LOC~~ SOLN
40.0000 mg | SUBCUTANEOUS | Status: DC
  Administered 2011-07-04 – 2011-07-06 (×3): 40 mg via SUBCUTANEOUS
  Filled 2011-07-04 (×4): qty 0.4

## 2011-07-04 MED ORDER — SENNOSIDES-DOCUSATE SODIUM 8.6-50 MG PO TABS
1.0000 | ORAL_TABLET | Freq: Every evening | ORAL | Status: DC | PRN
  Administered 2011-07-05 – 2011-07-06 (×2): 1 via ORAL
  Filled 2011-07-04 (×3): qty 1

## 2011-07-04 MED ORDER — PANTOPRAZOLE SODIUM 40 MG PO TBEC
40.0000 mg | DELAYED_RELEASE_TABLET | Freq: Every day | ORAL | Status: DC
  Administered 2011-07-05 – 2011-07-06 (×2): 40 mg via ORAL
  Filled 2011-07-04 (×2): qty 1

## 2011-07-04 MED ORDER — LOSARTAN POTASSIUM 50 MG PO TABS
50.0000 mg | ORAL_TABLET | Freq: Every day | ORAL | Status: DC
  Administered 2011-07-04 – 2011-07-07 (×4): 50 mg via ORAL
  Filled 2011-07-04 (×4): qty 1

## 2011-07-04 MED ORDER — IPRATROPIUM-ALBUTEROL 18-103 MCG/ACT IN AERO
2.0000 | INHALATION_SPRAY | Freq: Two times a day (BID) | RESPIRATORY_TRACT | Status: DC
  Administered 2011-07-04 – 2011-07-07 (×6): 2 via RESPIRATORY_TRACT
  Filled 2011-07-04: qty 14.7

## 2011-07-04 MED ORDER — ONDANSETRON HCL 4 MG PO TABS: 4.0000 mg | ORAL_TABLET | Freq: Four times a day (QID) | ORAL | Status: DC | PRN

## 2011-07-04 MED ORDER — ACETAMINOPHEN 650 MG RE SUPP: 650.0000 mg | Freq: Four times a day (QID) | RECTAL | Status: DC | PRN

## 2011-07-04 MED ORDER — FUROSEMIDE 40 MG PO TABS
40.0000 mg | ORAL_TABLET | Freq: Every day | ORAL | Status: DC
  Administered 2011-07-05 – 2011-07-07 (×3): 40 mg via ORAL
  Filled 2011-07-04 (×3): qty 1

## 2011-07-04 MED ORDER — GABAPENTIN 300 MG PO CAPS
300.0000 mg | ORAL_CAPSULE | Freq: Three times a day (TID) | ORAL | Status: DC
Start: 2011-07-04 — End: 2011-07-07
  Administered 2011-07-04 – 2011-07-07 (×8): 300 mg via ORAL
  Filled 2011-07-04 (×10): qty 1

## 2011-07-04 NOTE — ED Notes (Signed)
Have tried 3X to get PT to pee. PT is not able to every time

## 2011-07-04 NOTE — ED Notes (Signed)
[id number] Ready 

## 2011-07-04 NOTE — ED Notes (Signed)
Pt stated he has been having burning with urination.

## 2011-07-04 NOTE — Consult Note (Signed)
Reason for Consult: CHF on CXR  Requesting Physician: [facility]  HPI: This is a 78 y.o. male with a past medical history significant for CAD. He had CABG in 1998. He was just cathed 06/06/11 after an abnormal Myoview. Cath by Dr Herbie Baltimore showed patent grafts with an EF of 30-35%. Plan was for continued medical Rx. He is admitted now through the ER with a complaint of not feeling well for 3 days. He denies any increase in his chronic SOB. His serum Na+ was noted to be 123. He was treated with IVF in ER. Despite CXR showing CHF on admission he does not appear to in very SOB.  PMHx:  Past Medical History  Diagnosis Date  . CAD (coronary artery disease)   . Hypertension   . Hyperlipidemia   . GERD (gastroesophageal reflux disease)   . COPD (chronic obstructive pulmonary disease)   . CRF (chronic renal failure)   . Barrett's esophagus   . OP (osteoporosis)   . CAD (coronary artery disease) of artery bypass graft 07/04/2011  . Syncope 07/04/2011  . Pacemaker 07/04/2011  . Cardiomyopathy, ischemic 07/04/2011   Past Surgical History  Procedure Date  . Pacemaker placement 2007  . Cholecystectomy 2005  . Coronary artery bypass graft 1978    FAMHx: Family History  Problem Relation Age of Onset  . Leukemia Father   . Alzheimer's disease Mother   . Lung cancer Son 42  . Heart disease Father   . Arthritis Mother     SOCHx:  reports that he quit smoking about 34 years ago. His smoking use included Cigarettes. He has a 4.5 pack-year smoking history. He does not have any smokeless tobacco history on file. He reports that he does not drink alcohol or use illicit drugs.  ALLERGIES: No Known Allergies  ROS: Pertinent items are noted in HPI.  HOME MEDICATIONS: Prescriptions prior to admission  Medication Sig Dispense Refill  . albuterol-ipratropium (COMBIVENT) 18-103 MCG/ACT inhaler Inhale 2 puffs into the lungs 2 (two) times daily.       Marland Kitchen aspirin 325 MG tablet Take 325 mg by  mouth daily.        Marland Kitchen atorvastatin (LIPITOR) 20 MG tablet Take 20 mg by mouth daily.        . fluticasone (FLONASE) 50 MCG/ACT nasal spray 2 sprays in each nostril twice daily      . Fluticasone-Salmeterol (ADVAIR DISKUS) 500-50 MCG/DOSE AEPB Inhale 1 puff into the lungs every 12 (twelve) hours.        . furosemide (LASIX) 40 MG tablet Take 40 mg by mouth daily.      Marland Kitchen gabapentin (NEURONTIN) 300 MG capsule Take 300 mg by mouth 3 (three) times daily.       Marland Kitchen guaiFENesin (MUCINEX) 600 MG 12 hr tablet Take 600 mg by mouth daily.       Marland Kitchen loratadine (CLARITIN) 10 MG tablet Take 10 mg by mouth daily.      Marland Kitchen losartan (COZAAR) 50 MG tablet Take 50 mg by mouth daily.        . Magnesium Chloride (SLOW-MAG) 535 (64 MG) MG TBCR Take 1 tablet by mouth 2 (two) times daily.        . Omega-3 Fatty Acids (FISH OIL) 1000 MG CAPS Take 1 capsule by mouth daily.       . potassium chloride (K-DUR) 10 MEQ tablet Take 10 mEq by mouth daily.      . predniSONE (DELTASONE) 5 MG tablet Take 5  mg by mouth daily.        . ranitidine (ZANTAC) 150 MG capsule Take 150 mg by mouth 2 (two) times daily.       Marland Kitchen RAPAFLO 8 MG CAPS capsule Take 8 mg by mouth daily with breakfast. Once daily      . tiotropium (SPIRIVA) 18 MCG inhalation capsule Place 18 mcg into inhaler and inhale daily.       . Vitamin D, Ergocalciferol, (DRISDOL) 50000 UNITS CAPS Take 50,000 Units by mouth every 7 (seven) days. Take on Fridays      . vitamin E 1000 UNIT capsule Take 1,000 Units by mouth daily.          HOSPITAL MEDICATIONS: I have reviewed the patient's current medications.  VITALS: Blood pressure 140/56, pulse 59, temperature 97.8 F (36.6 C), temperature source Oral, resp. rate 18, height 6\' 2"  (1.88 m), weight 107.4 kg (236 lb 12.4 oz), SpO2 97.00%.  PHYSICAL EXAM: General appearance: alert, cooperative and no distress Neck: no adenopathy, no carotid bruit, no JVD, supple, symmetrical, trachea midline and thyroid not enlarged, symmetric,  no tenderness/mass/nodules Lungs: crackles Lt base Heart: regular rate and rhythm Abdomen: obese, non tender Extremities: 2+ puffy edema bilat Pulses: 2+ and symmetric Skin: Skin color, texture, turgor normal. No rashes or lesions Neurologic: Grossly normal  LABS: Results for orders placed during the hospital encounter of 07/04/11 (from the past 48 hour(s))  GLUCOSE, CAPILLARY     Status: Normal   Collection Time   07/04/11  9:09 AM      Component Value Range Comment   Glucose-Capillary 90  70 - 99 (mg/dL)   CBC     Status: Abnormal   Collection Time   07/04/11  9:45 AM      Component Value Range Comment   WBC 6.3  4.0 - 10.5 (K/uL)    RBC 4.26  4.22 - 5.81 (MIL/uL)    Hemoglobin 13.4  13.0 - 17.0 (g/dL)    HCT 16.1 (*) 09.6 - 52.0 (%)    MCV 86.6  78.0 - 100.0 (fL)    MCH 31.5  26.0 - 34.0 (pg)    MCHC 36.3 (*) 30.0 - 36.0 (g/dL)    RDW 04.5  40.9 - 81.1 (%)    Platelets 254  150 - 400 (K/uL)   DIFFERENTIAL     Status: Abnormal   Collection Time   07/04/11  9:45 AM      Component Value Range Comment   Neutrophils Relative 60  43 - 77 (%)    Neutro Abs 3.8  1.7 - 7.7 (K/uL)    Lymphocytes Relative 19  12 - 46 (%)    Lymphs Abs 1.2  0.7 - 4.0 (K/uL)    Monocytes Relative 12  3 - 12 (%)    Monocytes Absolute 0.8  0.1 - 1.0 (K/uL)    Eosinophils Relative 9 (*) 0 - 5 (%)    Eosinophils Absolute 0.6  0.0 - 0.7 (K/uL)    Basophils Relative 1  0 - 1 (%)    Basophils Absolute 0.0  0.0 - 0.1 (K/uL)   BASIC METABOLIC PANEL     Status: Abnormal   Collection Time   07/04/11  9:45 AM      Component Value Range Comment   Sodium 123 (*) 135 - 145 (mEq/L)    Potassium 3.8  3.5 - 5.1 (mEq/L)    Chloride 90 (*) 96 - 112 (mEq/L)    CO2 24  19 -  32 (mEq/L)    Glucose, Bld 84  70 - 99 (mg/dL)    BUN 6  6 - 23 (mg/dL)    Creatinine, Ser 1.61  0.50 - 1.35 (mg/dL)    Calcium 8.7  8.4 - 10.5 (mg/dL)    GFR calc non Af Amer 55 (*) >90 (mL/min)    GFR calc Af Amer 64 (*) >90 (mL/min)     CARDIAC PANEL(CRET KIN+CKTOT+MB+TROPI)     Status: Normal   Collection Time   07/04/11  9:45 AM      Component Value Range Comment   Total CK 79  7 - 232 (U/L)    CK, MB 3.5  0.3 - 4.0 (ng/mL)    Troponin I <0.30  <0.30 (ng/mL)    Relative Index RELATIVE INDEX IS INVALID  0.0 - 2.5    URINALYSIS, ROUTINE W REFLEX MICROSCOPIC     Status: Abnormal   Collection Time   07/04/11  1:33 PM      Component Value Range Comment   Color, Urine YELLOW  YELLOW     APPearance CLOUDY (*) CLEAR     Specific Gravity, Urine 1.011  1.005 - 1.030     pH 6.5  5.0 - 8.0     Glucose, UA NEGATIVE  NEGATIVE (mg/dL)    Hgb urine dipstick NEGATIVE  NEGATIVE     Bilirubin Urine NEGATIVE  NEGATIVE     Ketones, ur NEGATIVE  NEGATIVE (mg/dL)    Protein, ur NEGATIVE  NEGATIVE (mg/dL)    Urobilinogen, UA 1.0  0.0 - 1.0 (mg/dL)    Nitrite POSITIVE (*) NEGATIVE     Leukocytes, UA LARGE (*) NEGATIVE    URINE MICROSCOPIC-ADD ON     Status: Abnormal   Collection Time   07/04/11  1:33 PM      Component Value Range Comment   WBC, UA 21-50  <3 (WBC/hpf) SOME IN CLUMPS.   RBC / HPF 0-2  <3 (RBC/hpf)    Bacteria, UA MANY (*) RARE      IMAGING: Dg Chest 2 View  07/04/2011  *RADIOLOGY REPORT*  Clinical Data: Cough and shortness of breath.  CHEST - 2 VIEW  Comparison: 04/26/2011.  Findings: Trachea is midline.  Heart size stable.  Pacemaker lead tips project over the right atrium and right ventricle.  There is interstitial prominence and indistinctness bilaterally, new from 04/26/2011.  There may be a tiny right pleural effusion.  Biapical pleural thickening.  IMPRESSION: Pulmonary edema and possible tiny right pleural effusion.  Original Report Authenticated By: Reyes Ivan, M.D.    IMPRESSION: Principal Problem:  *Hyponatremia, 123 on admission, (same as 06/30/11) Active Problems:  Acute pulmonary edema, documented on CXR  Cardiomyopathy, ischemic, EF 30-35% by recent cath  Hypertension  NSVT reported in ER   Hypercholesteremia  COPD (chronic obstructive pulmonary disease)  CAD CABG 1998, patent grafts 06/06/11 after abn Myoview  Pacemaker, [employer], implanted 2008  GERD (gastroesophageal reflux disease)  UTI (lower urinary tract infection)  Edema of both legs   RECOMMENDATION: MD to see. Check BNP.   Time Spent Directly with Patient: 30 minutes  KILROY,LUKE K 07/04/2011, 5:48 PM   Patient seen and examined. Agree with assessment and plan. Very pleasant gentlemen who is a former pt of Dr. Sylvester Harder noww sees Dr. Herbie Baltimore. Recent cath 3 weeks ago showed patent grafts, with EF 30 -35%. He has been noted to be hyponatremic and complains of SOB.  PE is remarkable for volume overload which is  contributing to his dilutional hyponatremia.  Will give IV Lasix now, increase losartan to 75 mg daily (50 am and 25 pm) with potential to titrate to 50 bid if renal fxn tolerates. Rec BNP and CXR.  Will follow.   Lennette Bihari, MD, [facility] 07/04/2011 6:10 PM

## 2011-07-04 NOTE — ED Notes (Signed)
Pt stated he has been feeling weak for the past few weeks.  Denies any n/v/d.

## 2011-07-04 NOTE — H&P (Signed)
Richard Chavez MRN: [id number] DOB/AGE: 1927/03/21 78 y.o. Primary Care Physician:MCKENZIE,EDWARD B, MD, MD Admit date: 07/04/2011 Chief Complaint: Feeling sick HPI:  Richard Chavez  is a 78 year old Caucasian gentleman with history of coronary artery disease status post CABG, history of syncope status post pacemaker in 2008 third degree AV block, history of hypertension, hyperlipidemia, COPD, severe ischemic cardiomyopathy with an EF of 30-35% per cardiac catheterization on 06/06/2011, history of gastroesophageal reflux disease who presents to the ED with a 2 to three-day history of just feeling sick. Patient denies any fevers, no chest pain, chronic shortness of breath which is unchanged, no diarrhea, no constipation, no emesis. Patient does endorse feelings of fluttering in his chest which have been intermittent, nausea with dry heaves, decreased oral intake for the past 2-3 days, generalized weakness, productive cough of whitish sputum. Patient denies any dysuria. Patient denies any abdominal pain. Patient was seen in the emergency room urinalysis done was consistent with a urinary tract infection. Basic metabolic profile done showed a sodium of 123 with a chloride of 90. Patient was given a dose of IV Rocephin will call to admit the patient for further evaluation and management.  Past Medical History  Diagnosis Date  . CAD (coronary artery disease)   . Hypertension   . Hyperlipidemia   . GERD (gastroesophageal reflux disease)   . COPD (chronic obstructive pulmonary disease)   . CRF (chronic renal failure)   . Barrett's esophagus   . OP (osteoporosis)   . CAD (coronary artery disease) of artery bypass graft 07/04/2011  . Syncope 07/04/2011  . Pacemaker 07/04/2011  . Cardiomyopathy, ischemic 07/04/2011    Past Surgical History  Procedure Date  . Pacemaker placement 2007  . Cholecystectomy 2005  . Coronary artery bypass graft 1978    Prior to Admission medications   Medication Sig Start Date  End Date Taking? Authorizing Provider  albuterol-ipratropium (COMBIVENT) 18-103 MCG/ACT inhaler Inhale 2 puffs into the lungs 2 (two) times daily.    Yes Historical Provider, MD  aspirin 325 MG tablet Take 325 mg by mouth daily.     Yes Historical Provider, MD  atorvastatin (LIPITOR) 20 MG tablet Take 20 mg by mouth daily.     Yes Historical Provider, MD  fluticasone (FLONASE) 50 MCG/ACT nasal spray 2 sprays in each nostril twice daily 03/09/11  Yes Historical Provider, MD  Fluticasone-Salmeterol (ADVAIR DISKUS) 500-50 MCG/DOSE AEPB Inhale 1 puff into the lungs every 12 (twelve) hours.     Yes Historical Provider, MD  furosemide (LASIX) 40 MG tablet Take 40 mg by mouth daily.   Yes Historical Provider, MD  gabapentin (NEURONTIN) 300 MG capsule Take 300 mg by mouth 3 (three) times daily.  02/07/11  Yes Historical Provider, MD  guaiFENesin (MUCINEX) 600 MG 12 hr tablet Take 600 mg by mouth daily.    Yes Historical Provider, MD  loratadine (CLARITIN) 10 MG tablet Take 10 mg by mouth daily.   Yes Historical Provider, MD  losartan (COZAAR) 50 MG tablet Take 50 mg by mouth daily.     Yes Historical Provider, MD  Magnesium Chloride (SLOW-MAG) 535 (64 MG) MG TBCR Take 1 tablet by mouth 2 (two) times daily.     Yes Historical Provider, MD  Omega-3 Fatty Acids (FISH OIL) 1000 MG CAPS Take 1 capsule by mouth daily.    Yes Historical Provider, MD  potassium chloride (K-DUR) 10 MEQ tablet Take 10 mEq by mouth daily.   Yes Historical Provider, MD  predniSONE (DELTASONE) 5  MG tablet Take 5 mg by mouth daily.     Yes Historical Provider, MD  ranitidine (ZANTAC) 150 MG capsule Take 150 mg by mouth 2 (two) times daily.    Yes Historical Provider, MD  RAPAFLO 8 MG CAPS capsule Take 8 mg by mouth daily with breakfast. Once daily 03/09/11  Yes Historical Provider, MD  tiotropium (SPIRIVA) 18 MCG inhalation capsule Place 18 mcg into inhaler and inhale daily.    Yes Historical Provider, MD  Vitamin D, Ergocalciferol,  (DRISDOL) 50000 UNITS CAPS Take 50,000 Units by mouth every 7 (seven) days. Take on Fridays   Yes Historical Provider, MD  vitamin E 1000 UNIT capsule Take 1,000 Units by mouth daily.     Yes Historical Provider, MD    Allergies: No Known Allergies  Family History  Problem Relation Age of Onset  . Leukemia Father   . Alzheimer's disease Mother   . Lung cancer Son 72  . Heart disease Father   . Arthritis Mother     Social History:  reports that he quit smoking about 34 years ago. His smoking use included Cigarettes. He has a 4.5 pack-year smoking history. He does not have any smokeless tobacco history on file. He reports that he does not drink alcohol or use illicit drugs.  ROS: All systems reviewed with the patient and was positive as per HPI otherwise all other systems are negative.  PHYSICAL EXAM: Blood pressure 121/60, pulse 59, temperature 97.9 F (36.6 C), temperature source Oral, resp. rate 18, SpO2 97.00%. General: Well-developed well-nourished. Alert, awake, oriented x3, in no acute distress. HEENT: Normocephalic atraumatic. Pupils equal round and reactive to light and accommodation. Extraocular movements intact. Oropharynx is clear, no lesions, no exudates. Neck is supple with no lymphadenopathy. No JVD. No bruits, no goiter. Dry mucous membranes Heart: Regular rate and rhythm, without murmurs, rubs, gallops. Lungs: Decreased breath sounds in the bases otherwise Clear to auscultation bilaterally. Abdomen: Soft, nontender, nondistended, positive bowel sounds. Extremities: No clubbing cyanosis. 23+ bilateral lower extremity edema with positive pedal pulses. Neuro: Alert and hard to x3. Cranial nerves II through XII are grossly intact.  nonfocal.    EKG: Normal sinus rhythm with occasional PVCs  No results found for this or any previous visit (from the past 240 hour(s)).   Lab results:  Basename 07/04/11 0945  NA 123*  K 3.8  CL 90*  CO2 24  GLUCOSE 84  BUN 6    CREATININE 1.19  CALCIUM 8.7  MG --  PHOS --   No results found for this basename: AST:2,ALT:2,ALKPHOS:2,BILITOT:2,PROT:2,ALBUMIN:2 in the last 72 hours No results found for this basename: LIPASE:2,AMYLASE:2 in the last 72 hours  Basename 07/04/11 0945  WBC 6.3  NEUTROABS 3.8  HGB 13.4  HCT 36.9*  MCV 86.6  PLT 254    Basename 07/04/11 0945  CKTOTAL 79  CKMB 3.5  CKMBINDEX --  TROPONINI <0.30   No components found with this basename: POCBNP:3 No results found for this basename: DDIMER in the last 72 hours No results found for this basename: HGBA1C:2 in the last 72 hours No results found for this basename: CHOL:2,HDL:2,LDLCALC:2,TRIG:2,CHOLHDL:2,LDLDIRECT:2 in the last 72 hours No results found for this basename: TSH,T4TOTAL,FREET3,T3FREE,THYROIDAB in the last 72 hours No results found for this basename: VITAMINB12:2,FOLATE:2,FERRITIN:2,TIBC:2,IRON:2,RETICCTPCT:2 in the last 72 hours Imaging results:  Dg Chest 2 View  07/04/2011  *RADIOLOGY REPORT*  Clinical Data: Cough and shortness of breath.  CHEST - 2 VIEW  Comparison: 04/26/2011.  Findings: Trachea is  midline.  Heart size stable.  Pacemaker lead tips project over the right atrium and right ventricle.  There is interstitial prominence and indistinctness bilaterally, new from 04/26/2011.  There may be a tiny right pleural effusion.  Biapical pleural thickening.  IMPRESSION: Pulmonary edema and possible tiny right pleural effusion.  Original Report Authenticated By: Reyes Ivan, M.D.   Ct Abdomen Pelvis W Contrast  06/30/2011  *RADIOLOGY REPORT*  Clinical Data: Upper abdominal pain, nausea, shortness of breath.  CT ABDOMEN AND PELVIS WITH CONTRAST  Technique:  Multidetector CT imaging of the abdomen and pelvis was performed following the standard protocol during bolus administration of intravenous contrast.  Contrast: OMNIPAQUE IOHEXOL 300 MG/ML IV SOLN  Comparison: Plain films 06/30/2011  Findings: Areas of  scarring/fibrosis in the lung bases.  Calcified pleural plaques in the right lung base.  Mild cardiomegaly.  Pacer wires noted in the right heart.  No effusions.  Prior cholecystectomy.  Liver, spleen, pancreas, adrenals and kidneys are unremarkable.  Aorta and iliac vessels are heavily calcified, non-aneurysmal.  Prostate is enlarged.  Urinary bladder is grossly unremarkable.  Descending colonic and sigmoid diverticulosis.  No active diverticulitis.  Small bowel is decompressed, grossly unremarkable. No free fluid, free air or adenopathy.  No acute bony abnormality.  IMPRESSION: Fibrosis and scarring in the lung bases.  Calcified pleural plaques in the right lung base.  Descending colonic and sigmoid diverticulosis.  No acute findings in the abdomen or pelvis.  Original Report Authenticated By: Cyndie Chime, M.D.   Dg Abd Acute W/chest  06/30/2011  *RADIOLOGY REPORT*  Clinical Data: Abdominal pain and distention.  Nausea and vomiting. Former smoker with history of COPD.  Prior CABG.  ACUTE ABDOMEN SERIES (ABDOMEN 2 VIEW & CHEST 1 VIEW) 06/30/2011:  Comparison: Two-view chest x-ray 04/26/2011 [facility] and 06/15/2008 [facility].  No prior abdominal imaging.  Findings: Gas within the upper normal caliber colon, with gas and stool noted to the rectum.  Gas within multiple upper normal size to small bowel loops diffusely throughout the abdomen.  No evidence of free air on the erect image.  Air-fluid levels in the colon consistent with liquid stool.  No small bowel air-fluid levels. Aorto-iliofemoral atherosclerosis without a visible aneurysm. Visible psoas margins.  No visible opaque urinary tract calculi. Degenerative changes involving the lower lumbar spine.  Prior sternotomy for CABG.  Cardiac silhouette moderately enlarged but stable.  Left subclavian dual lead transvenous pacemaker unchanged and intact.  Interval development of diffuse interstitial pulmonary opacities since the  04/2011 examination.  Small bilateral pleural effusions.  IMPRESSION:  1.  Mild generalized ileus and/or enterocolitis.  No evidence of bowel obstruction or free intraperitoneal air. 2.  Stable cardiomegaly.  Diffuse interstitial pulmonary edema and small bilateral pleural effusions is consistent with mild CHF.  Original Report Authenticated By: Arnell Sieving, M.D.   Impression/Plan:  Principal Problem:  *Hyponatremia Active Problems:  Hypertension  Abnormal heart rhythms  Hypercholesteremia  COPD (chronic obstructive pulmonary disease)  GERD (gastroesophageal reflux disease)  UTI (lower urinary tract infection)  CAD (coronary artery disease) of artery bypass graft  Pacemaker  Cardiomyopathy, ischemic  Edema of both legs   #1 hyponatremia Questionable etiology. May be secondary to volume depletion versus volume overload. Admit to the step down unit. Chest x-ray with questionable pulmonary edema. However patient appears clinically dry. Will check a urine sodium, urine creatinine , urine osmolality, serum osmolality, orthostatic blood pressures, TSH. Will check a CT of the  head. Will give gentle hydration with normal saline at 75 cc per hour x24 hours then saline lock. Will check serial BMET. If sodium continues to worsen with hydration we'll place on IV Lasix. Follow.  #2 urinary tract infection Urine cultures are pending. IV Rocephin.  #3 lower extremity edema Questionable right-sided heart failure. Will check a BNP. We'll cycle cardiac enzymes every 8 hours x3. Patient with recent cardiac catheterization with EF of 30-35% a such will hold off on a 2-D echo. Continue home dose Lasix for now while labs are pending. Will consult with cardiology.  #4 coronary artery disease status post CABG/ischemic cardiomyopathy EF of 30-35% per cardiac catheterization of January 2013 We'll cycle cardiac enzymes every 8 hours x3. Will check a BNP. Continue home regimen of Lasix, losartan, aspirin,  will consult cardiology for further evaluation and management.  #5 nonsustained V. tach We'll cycle cardiac enzymes every 8 hours x3. Check a magnesium level. Follow electrolytes and keep potassium greater than 4. Will consult with cardiology. Interrogate pacemaker.  #6 COPD Stable. Continue Spiriva, and inhalers.  #7 status post pacemaker Interrogate pacemaker.  #8 gastroesophageal reflux disease PPI.  #9 prophylaxis PPI for GI prophylaxis. Lovenox for DVT prophylaxis.   Richard Chavez 07/04/2011, 4:14 PM

## 2011-07-04 NOTE — ED Provider Notes (Addendum)
History     CSN: [id number]  Arrival date & time 07/04/11  0845   First MD Initiated Contact with Patient 07/04/11 [phone]      No chief complaint on file.   (Consider location/radiation/quality/duration/timing/severity/associated sxs/prior treatment) HPI Comments: Patient presents complaining of not feeling well last night.  He states he did not sleep well because of this.  He's had some mild productive cough but is without specific fevers.  He's noted intermittent periods where his heart is racing.  Patient notably does have a pacemaker.  He states he did have a cardiac catheterization completed last week and was told he had no significant blockages that needed intervention.  Patient denies any change in his baseline chest pain as the patient notes that he has some chest pain every day but today is no different.  He has no chest pain at this time.  He endorses some mild shortness of breath.  He denies any nausea, vomiting, abdominal pain.  He did mention that he has some mild dysuria as well.  Patient is a 78 y.o. male presenting with cough. The history is provided by the patient. No language interpreter was used.  Cough This is a new problem. Associated symptoms include chest pain and shortness of breath. Pertinent negatives include no chills, no headaches and no eye redness.    Past Medical History  Diagnosis Date  . CAD (coronary artery disease)   . Hypertension   . Hyperlipidemia   . GERD (gastroesophageal reflux disease)   . COPD (chronic obstructive pulmonary disease)   . CRF (chronic renal failure)   . Barrett's esophagus   . OP (osteoporosis)     Past Surgical History  Procedure Date  . Pacemaker placement 2007  . Cholecystectomy 2005  . Coronary artery bypass graft 1978    Family History  Problem Relation Age of Onset  . Leukemia Father   . Alzheimer's disease Mother   . Lung cancer Son 30  . Heart disease Father   . Arthritis Mother     History  Substance Use  Topics  . Smoking status: Former Smoker -- 0.3 packs/day for 15 years    Types: Cigarettes    Quit date: 05/07/1977  . Smokeless tobacco: Not on file  . Alcohol Use: No      Review of Systems  Constitutional: Negative.  Negative for fever and chills.  HENT: Negative.   Eyes: Negative.  Negative for discharge and redness.  Respiratory: Positive for cough and shortness of breath.   Cardiovascular: Positive for chest pain and palpitations.  Gastrointestinal: Negative.  Negative for nausea, vomiting and abdominal pain.  Genitourinary: Positive for dysuria. Negative for hematuria.  Musculoskeletal: Negative.  Negative for back pain.  Skin: Negative.  Negative for color change and rash.  Neurological: Negative for syncope and headaches.  Hematological: Negative.  Negative for adenopathy.  Psychiatric/Behavioral: Negative.  Negative for confusion.  All other systems reviewed and are negative.    Allergies  Review of patient's allergies indicates no known allergies.  Home Medications   Current Outpatient Rx  Name Route Sig Dispense Refill  . IPRATROPIUM-ALBUTEROL 18-103 MCG/ACT IN AERO Inhalation Inhale 2 puffs into the lungs 2 (two) times daily.     . ASPIRIN 325 MG PO TABS Oral Take 325 mg by mouth daily.      . ATORVASTATIN CALCIUM 20 MG PO TABS Oral Take 20 mg by mouth daily.      Marland Kitchen FLUTICASONE PROPIONATE 50 MCG/ACT NA SUSP  2 sprays in each nostril twice daily    . FLUTICASONE-SALMETEROL 500-50 MCG/DOSE IN AEPB Inhalation Inhale 1 puff into the lungs every 12 (twelve) hours.      . FUROSEMIDE 40 MG PO TABS Oral Take 40 mg by mouth daily.    Marland Kitchen GABAPENTIN 300 MG PO CAPS Oral Take 300 mg by mouth 3 (three) times daily.     . GUAIFENESIN ER 600 MG PO TB12 Oral Take 600 mg by mouth daily.     Marland Kitchen LORATADINE 10 MG PO TABS Oral Take 10 mg by mouth daily.    Marland Kitchen LOSARTAN POTASSIUM 50 MG PO TABS Oral Take 50 mg by mouth daily.      Marland Kitchen MAGNESIUM CHLORIDE ER 535 (64 MG) MG PO TBCR Oral Take  1 tablet by mouth 2 (two) times daily.      Marland Kitchen FISH OIL 1000 MG PO CAPS Oral Take 1 capsule by mouth daily.     Marland Kitchen POTASSIUM CHLORIDE ER 10 MEQ PO TBCR Oral Take 10 mEq by mouth daily.    Marland Kitchen PREDNISONE 5 MG PO TABS Oral Take 5 mg by mouth daily.      Marland Kitchen RANITIDINE HCL 150 MG PO CAPS Oral Take 150 mg by mouth 2 (two) times daily.     Marland Kitchen RAPAFLO 8 MG PO CAPS Oral Take 8 mg by mouth daily with breakfast. Once daily    . TIOTROPIUM BROMIDE MONOHYDRATE 18 MCG IN CAPS Inhalation Place 18 mcg into inhaler and inhale daily.     Marland Kitchen VITAMIN D (ERGOCALCIFEROL) 50000 UNITS PO CAPS Oral Take 50,000 Units by mouth every 7 (seven) days. Take on Fridays    . VITAMIN E 1000 UNITS PO CAPS Oral Take 1,000 Units by mouth daily.        BP 123/50  Pulse 84  Temp(Src) 97.9 F (36.6 C) (Oral)  Resp 17  SpO2 98%  Physical Exam  Nursing note and vitals reviewed. Constitutional: He is oriented to person, place, and time. He appears well-developed and well-nourished.  Non-toxic appearance. He does not have a sickly appearance.  HENT:  Head: Normocephalic and atraumatic.  Eyes: Conjunctivae, EOM and lids are normal. Pupils are equal, round, and reactive to light.  Neck: Trachea normal, normal range of motion and full passive range of motion without pain. Neck supple.  Cardiovascular: Normal rate, regular rhythm and normal heart sounds.   Pulmonary/Chest: Effort normal and breath sounds normal. No respiratory distress. He has no wheezes. He has no rales. He exhibits no tenderness.  Abdominal: Soft. Normal appearance. He exhibits no distension. There is no tenderness. There is no rebound and no CVA tenderness.  Musculoskeletal: Normal range of motion.  Neurological: He is alert and oriented to person, place, and time. He has normal strength.  Skin: Skin is warm, dry and intact. No rash noted. There is erythema.       Left lower quadrant of his abdomen there is a small area with a scabbed center and mild erythema around it  but no fluctuance.  This spans approximately 2-1/2 inches.  Psychiatric: He has a normal mood and affect. His behavior is normal. Judgment and thought content normal.    ED Course  Procedures (including critical care time)  Results for orders placed during the hospital encounter of 07/04/11  URINALYSIS, ROUTINE W REFLEX MICROSCOPIC      Component Value Range   Color, Urine YELLOW  YELLOW    APPearance CLOUDY (*) CLEAR    Specific Gravity,  Urine 1.011  1.005 - 1.030    pH 6.5  5.0 - 8.0    Glucose, UA NEGATIVE  NEGATIVE (mg/dL)   Hgb urine dipstick NEGATIVE  NEGATIVE    Bilirubin Urine NEGATIVE  NEGATIVE    Ketones, ur NEGATIVE  NEGATIVE (mg/dL)   Protein, ur NEGATIVE  NEGATIVE (mg/dL)   Urobilinogen, UA 1.0  0.0 - 1.0 (mg/dL)   Nitrite POSITIVE (*) NEGATIVE    Leukocytes, UA LARGE (*) NEGATIVE   GLUCOSE, CAPILLARY      Component Value Range   Glucose-Capillary 90  70 - 99 (mg/dL)  CBC      Component Value Range   WBC 6.3  4.0 - 10.5 (K/uL)   RBC 4.26  4.22 - 5.81 (MIL/uL)   Hemoglobin 13.4  13.0 - 17.0 (g/dL)   HCT 16.1 (*) 09.6 - 52.0 (%)   MCV 86.6  78.0 - 100.0 (fL)   MCH 31.5  26.0 - 34.0 (pg)   MCHC 36.3 (*) 30.0 - 36.0 (g/dL)   RDW 04.5  40.9 - 81.1 (%)   Platelets 254  150 - 400 (K/uL)  DIFFERENTIAL      Component Value Range   Neutrophils Relative 60  43 - 77 (%)   Neutro Abs 3.8  1.7 - 7.7 (K/uL)   Lymphocytes Relative 19  12 - 46 (%)   Lymphs Abs 1.2  0.7 - 4.0 (K/uL)   Monocytes Relative 12  3 - 12 (%)   Monocytes Absolute 0.8  0.1 - 1.0 (K/uL)   Eosinophils Relative 9 (*) 0 - 5 (%)   Eosinophils Absolute 0.6  0.0 - 0.7 (K/uL)   Basophils Relative 1  0 - 1 (%)   Basophils Absolute 0.0  0.0 - 0.1 (K/uL)  BASIC METABOLIC PANEL      Component Value Range   Sodium 123 (*) 135 - 145 (mEq/L)   Potassium 3.8  3.5 - 5.1 (mEq/L)   Chloride 90 (*) 96 - 112 (mEq/L)   CO2 24  19 - 32 (mEq/L)   Glucose, Bld 84  70 - 99 (mg/dL)   BUN 6  6 - 23 (mg/dL)    Creatinine, Ser 9.14  0.50 - 1.35 (mg/dL)   Calcium 8.7  8.4 - 78.2 (mg/dL)   GFR calc non Af Amer 55 (*) >90 (mL/min)   GFR calc Af Amer 64 (*) >90 (mL/min)  CARDIAC PANEL(CRET KIN+CKTOT+MB+TROPI)      Component Value Range   Total CK 79  7 - 232 (U/L)   CK, MB 3.5  0.3 - 4.0 (ng/mL)   Troponin I <0.30  <0.30 (ng/mL)   Relative Index RELATIVE INDEX IS INVALID  0.0 - 2.5   URINE MICROSCOPIC-ADD ON      Component Value Range   WBC, UA 21-50  <3 (WBC/hpf)   RBC / HPF 0-2  <3 (RBC/hpf)   Bacteria, UA MANY (*) RARE    Dg Chest 2 View  07/04/2011  *RADIOLOGY REPORT*  Clinical Data: Cough and shortness of breath.  CHEST - 2 VIEW  Comparison: 04/26/2011.  Findings: Trachea is midline.  Heart size stable.  Pacemaker lead tips project over the right atrium and right ventricle.  There is interstitial prominence and indistinctness bilaterally, new from 04/26/2011.  There may be a tiny right pleural effusion.  Biapical pleural thickening.  IMPRESSION: Pulmonary edema and possible tiny right pleural effusion.  Original Report Authenticated By: Reyes Ivan, M.D.        Date:  07/04/2011  Rate: 84  Rhythm: NSR with multiple PVCs  QRS Axis: normal  Intervals: normal  ST/T Wave abnormalities: nonspecific ST/T changes  Conduction Disutrbances:none  Narrative Interpretation:   Old EKG Reviewed: changes noted from 06/30/11 where pt had atrial paced rhythm with intermittent PVCs    MDM  Patient with vague symptoms on presentation.  He is noted to have some abnormal electrolytes including his sodium and chloride and is receiving some IV fluids at this time.  I believe he'll need to be admitted for further correction of his sodium.  Patient secondarily also has been found to have a urinary tract infection which has had a culture sent and ceftriaxone dose ordered.  Patient incidentally has also been found here to have a brief run of V. tach in the emergency department despite his pacemaker.  The  patient is a [employer] patient and we will attempt to call them to come into an interrogation on this device.  Patient was discussed with triad for admission to team 1 and a step down bed        Nat Christen, MD 07/04/11 1521  Call to [facility]. Jude has been made to have pacemaker interrogated due to episode of v-tach here.    Nat Christen, MD 07/04/11 [phone]

## 2011-07-04 NOTE — ED Notes (Signed)
CBG:90 

## 2011-07-05 ENCOUNTER — Encounter (HOSPITAL_COMMUNITY): Payer: Self-pay | Admitting: Cardiology

## 2011-07-05 ENCOUNTER — Inpatient Hospital Stay (HOSPITAL_COMMUNITY): Payer: Medicare Other

## 2011-07-05 LAB — COMPREHENSIVE METABOLIC PANEL
CO2: 22 mEq/L (ref 19–32)
Calcium: 8.3 mg/dL — ABNORMAL LOW (ref 8.4–10.5)
Creatinine, Ser: 1.03 mg/dL (ref 0.50–1.35)
GFR calc Af Amer: 76 mL/min — ABNORMAL LOW (ref >90)
GFR calc non Af Amer: 66 mL/min — ABNORMAL LOW (ref >90)
Glucose, Bld: 93 mg/dL (ref 70–99)

## 2011-07-05 LAB — BASIC METABOLIC PANEL
BUN: 5 mg/dL — ABNORMAL LOW (ref 6–23)
CO2: 25 mEq/L (ref 19–32)
Chloride: 88 mEq/L — ABNORMAL LOW (ref 96–112)
Creatinine, Ser: 1.03 mg/dL (ref 0.50–1.35)
Glucose, Bld: 93 mg/dL (ref 70–99)

## 2011-07-05 LAB — CBC
Hemoglobin: 13.6 g/dL (ref 13.0–17.0)
MCHC: 36.1 g/dL — ABNORMAL HIGH (ref 30.0–36.0)
RBC: 4.35 MIL/uL (ref 4.22–5.81)

## 2011-07-05 LAB — CARDIAC PANEL(CRET KIN+CKTOT+MB+TROPI)
Relative Index: INVALID (ref 0.0–2.5)
Total CK: 76 U/L (ref 7–232)
Total CK: 74 U/L (ref 7–232)

## 2011-07-05 MED ORDER — MAGNESIUM HYDROXIDE 400 MG/5ML PO SUSP
30.0000 mL | Freq: Once | ORAL | Status: AC
  Administered 2011-07-05: 30 mL via ORAL
  Filled 2011-07-05 (×2): qty 30

## 2011-07-05 MED ORDER — TOLVAPTAN 15 MG PO TABS
15.0000 mg | ORAL_TABLET | ORAL | Status: DC
  Administered 2011-07-05 – 2011-07-06 (×2): 15 mg via ORAL
  Filled 2011-07-05 (×4): qty 1

## 2011-07-05 MED ORDER — FENTANYL CITRATE 0.05 MG/ML IJ SOLN
INTRAMUSCULAR | Status: AC
  Filled 2011-07-05: qty 2

## 2011-07-05 MED ORDER — POTASSIUM CHLORIDE CRYS ER 20 MEQ PO TBCR
40.0000 meq | EXTENDED_RELEASE_TABLET | Freq: Two times a day (BID) | ORAL | Status: DC
  Administered 2011-07-05: 40 meq via ORAL
  Filled 2011-07-05 (×3): qty 2

## 2011-07-05 MED ORDER — POTASSIUM CHLORIDE CRYS ER 20 MEQ PO TBCR: 20.0000 meq | EXTENDED_RELEASE_TABLET | Freq: Two times a day (BID) | ORAL | Status: DC

## 2011-07-05 MED ORDER — METOPROLOL TARTRATE 25 MG PO TABS
25.0000 mg | ORAL_TABLET | Freq: Two times a day (BID) | ORAL | Status: DC
  Administered 2011-07-05 – 2011-07-07 (×4): 25 mg via ORAL
  Filled 2011-07-05 (×6): qty 1

## 2011-07-05 MED ORDER — MIDAZOLAM HCL 2 MG/2ML IJ SOLN
INTRAMUSCULAR | Status: AC
  Filled 2011-07-05: qty 2

## 2011-07-05 NOTE — Evaluation (Signed)
Occupational Therapy Evaluation Patient Details Name: Richard Chavez MRN: [id number] DOB: Dec 17, 1926 Today's Date: 07/05/2011  Problem List:  Patient Active Problem List  Diagnoses  . Hypertension  . Heart attack  . NSVT reported in ER  . Hypercholesteremia  . Allergic rhinitis  . COPD (chronic obstructive pulmonary disease)  . GERD (gastroesophageal reflux disease)  . Barrett's esophagus  . Hyponatremia, 123 on admission, (same as 06/30/11)  . UTI (lower urinary tract infection)  . CAD CABG 1998, patent grafts 06/06/11 after abn Myoview  . Syncope  . Pacemaker, St Jude, implanted 2008  . Cardiomyopathy, ischemic, EF 30-35% by recent cath  . Edema of both legs  . Acute pulmonary edema, documented on CXR    Past Medical History:  Past Medical History  Diagnosis Date  . CAD (coronary artery disease)   . Hypertension   . Hyperlipidemia   . GERD (gastroesophageal reflux disease)   . COPD (chronic obstructive pulmonary disease)   . CRF (chronic renal failure)   . Barrett's esophagus   . OP (osteoporosis)   . CAD (coronary artery disease) of artery bypass graft 07/04/2011    LIMA-LAD; SVG-OM2-OM3, SVG-RCA -- all patent as of 05/27/11  . Syncope 07/04/2011  . Pacemaker 07/04/2011  . Cardiomyopathy, ischemic 07/04/2011   Past Surgical History:  Past Surgical History  Procedure Date  . Pacemaker placement 2007  . Cholecystectomy 2005  . Coronary artery bypass graft 1978    OT Assessment/Plan/Recommendation OT Assessment Clinical Impression Statement: Pt admitted with hyponatremia, cough.  Has complicated medical hx.  Pt was independent PTA.  Now requiring min assist mostly due to unsteadiness, weakness. Pt also displaying some memory issues.  Will follow acutely.  Recommend home with HHOT if memory issues resolve.  Will defer bathroom equipment needs to HHOT. OT Recommendation/Assessment: Patient will need skilled OT in the acute care venue OT Problem List: Decreased  strength;Decreased activity tolerance;Impaired balance (sitting and/or standing);Decreased cognition;Decreased knowledge of use of DME or AE OT Therapy Diagnosis : Generalized weakness;Cognitive deficits OT Plan OT Frequency: Min 2X/week OT Treatment/Interventions: Self-care/ADL training;DME and/or AE instruction;Cognitive remediation/compensation;Patient/family education OT Recommendation Follow Up Recommendations: Home health OT;Supervision - Intermittent Equipment Recommended: Rolling walker with 5" wheels (RW if needed, depending on progress) Individuals Consulted Consulted and Agree with Results and Recommendations: Patient OT Goals Acute Rehab OT Goals OT Goal Formulation: With patient Time For Goal Achievement: 2 weeks ADL Goals Pt Will Perform Grooming: with modified independence;Standing at sink (3 activities) ADL Goal: Grooming - Progress: Goal set today Pt Will Perform Upper Body Bathing: with set-up;Sitting, edge of bed ADL Goal: Upper Body Bathing - Progress: Goal set today Pt Will Perform Lower Body Bathing: with set-up;Sitting, edge of bed;Sit to stand from bed ADL Goal: Lower Body Bathing - Progress: Goal set today Pt Will Perform Upper Body Dressing: with set-up;Sitting, chair ADL Goal: Upper Body Dressing - Progress: Goal set today Pt Will Perform Lower Body Dressing: with set-up;Sit to stand from bed;Sitting, bed ADL Goal: Lower Body Dressing - Progress: Goal set today Pt Will Transfer to Toilet: with modified independence;Ambulation;Regular height toilet ADL Goal: Toilet Transfer - Progress: Goal set today Pt Will Perform Toileting - Clothing Manipulation: with modified independence;Standing ADL Goal: Toileting - Clothing Manipulation - Progress: Goal set today Pt Will Perform Tub/Shower Transfer: Tub transfer;with modified independence;Ambulation (demonstrate tub equipment) ADL Goal: Tub/Shower Transfer - Progress: Goal set today  OT  Evaluation Precautions/Restrictions  Precautions Precautions: Fall Restrictions Weight Bearing Restrictions: No Prior Functioning Home  Living Lives With: Sheran Spine Help From: Friend(s) (pt has minimal support at home per MD) Type of Home: House Home Layout: One level Home Access: Stairs to enter Entrance Stairs-Rails: None (holds door) Secretary/administrator of Steps: 2 Bathroom Shower/Tub: Engineer, manufacturing systems: Standard Home Adaptive Equipment: Grab bars in shower;Grab bars around toilet Prior Function Level of Independence: Independent with basic ADLs;Independent with homemaking with ambulation;Independent with gait;Independent with transfers Able to Take Stairs?: Yes Driving: Yes Vocation: Retired ADL ADL Eating/Feeding: Simulated;Independent Where Assessed - Eating/Feeding: Chair Grooming: Simulated;Set up;Supervision/safety Where Assessed - Grooming: Sitting at sink Upper Body Bathing: Minimal assistance;Simulated Where Assessed - Upper Body Bathing: Sitting, chair Lower Body Bathing: Simulated;Minimal assistance Where Assessed - Lower Body Bathing: Sitting, chair;Sit to stand from chair Upper Body Dressing: Performed;Minimal assistance Where Assessed - Upper Body Dressing: Standing Lower Body Dressing: Performed;Set up;Supervision/safety (socks) Where Assessed - Lower Body Dressing: Sitting, chair;Sit to stand from chair Toilet Transfer: Simulated;Minimal assistance (min guard) Toilet Transfer Method: Ambulating Equipment Used: Rolling walker Ambulation Related to ADLs: Min guard assist around bed and back with RW. Vision/Perception  Vision - History Baseline Vision: Wears glasses all the time Visual History: Cataracts Patient Visual Report: No change from baseline Cognition Cognition Arousal/Alertness: Awake/alert Overall Cognitive Status: Impaired Memory: Appears impaired Memory Deficits: pt reports more assist at home than MD confirms he has,  unable to give familiar MD's name, confusing recent medical hx, off topic conversation Orientation Level: Oriented X4 Sensation/Coordination Coordination Gross Motor Movements are Fluid and Coordinated: Yes Fine Motor Movements are Fluid and Coordinated: Yes Extremity Assessment RUE Assessment RUE Assessment: Within Functional Limits LUE Assessment LUE Assessment: Within Functional Limits Mobility  Bed Mobility Bed Mobility: No Transfers Transfers: Yes Sit to Stand: 4: Min assist Stand to Sit: 4: Min assist End of Session OT - End of Session Equipment Utilized During Treatment: Gait belt Activity Tolerance: Patient tolerated treatment well Patient left: in chair;with call bell in reach General Behavior During Session: [facility] for tasks performed Cognition: [facility] for tasks performed   Evern Bio 07/05/2011, 11:55 AM

## 2011-07-05 NOTE — Progress Notes (Signed)
Subjective:  This is a 78 y.o. male with a past medical history significant for CAD. He had CABG in 1998. He was just cathed 06/06/11 after an abnormal Myoview. Cath by Dr Herbie Baltimore showed patent grafts with an EF of 30-35%. Plan was for continued medical Rx. He is admitted now through the ER with a complaint of not feeling well for 3 days. He denies any increase in his chronic SOB. His serum Na+ was noted to be 123. He was treated with IVF in ER. Despite CXR showing CHF on admission he does not appear to in very SOB.   Objective: Vital signs in last 24 hours: Filed Vitals:   07/05/11 0600 07/05/11 0755 07/05/11 0800 07/05/11 0807  BP: 128/62  130/49   Pulse: 70  69 85  Temp:    97.2 F (36.2 C)  TempSrc:    Oral  Resp: 17  18 16   Height:      Weight:      SpO2: 96% 95% 95% 95%    Intake/Output Summary (Last 24 hours) at 07/05/11 0834 Last data filed at 07/05/11 7846  Gross per 24 hour  Intake    374 ml  Output   2500 ml  Net  -2126 ml    Weight change:   General appearance: alert, cooperative and no distress  Neck: no adenopathy, no carotid bruit, no JVD, supple, symmetrical, trachea midline and thyroid not enlarged, symmetric, no tenderness/mass/nodules  Lungs: crackles Lt base  Heart: regular rate and rhythm  Abdomen: obese, non tender  Extremities: 2+ puffy edema bilat  Pulses: 2+ and symmetric  Skin: Skin color, texture, turgor normal. No rashes or lesions  Neurologic: Grossly normal   Lab Results: Results for orders placed during the hospital encounter of 07/04/11 (from the past 24 hour(s))  GLUCOSE, CAPILLARY     Status: Normal   Collection Time   07/04/11  9:09 AM      Component Value Range   Glucose-Capillary 90  70 - 99 (mg/dL)  CBC     Status: Abnormal   Collection Time   07/04/11  9:45 AM      Component Value Range   WBC 6.3  4.0 - 10.5 (K/uL)   RBC 4.26  4.22 - 5.81 (MIL/uL)   Hemoglobin 13.4  13.0 - 17.0 (g/dL)   HCT 96.2 (*) 95.2 - 52.0 (%)   MCV 86.6   78.0 - 100.0 (fL)   MCH 31.5  26.0 - 34.0 (pg)   MCHC 36.3 (*) 30.0 - 36.0 (g/dL)   RDW 84.1  32.4 - 40.1 (%)   Platelets 254  150 - 400 (K/uL)  DIFFERENTIAL     Status: Abnormal   Collection Time   07/04/11  9:45 AM      Component Value Range   Neutrophils Relative 60  43 - 77 (%)   Neutro Abs 3.8  1.7 - 7.7 (K/uL)   Lymphocytes Relative 19  12 - 46 (%)   Lymphs Abs 1.2  0.7 - 4.0 (K/uL)   Monocytes Relative 12  3 - 12 (%)   Monocytes Absolute 0.8  0.1 - 1.0 (K/uL)   Eosinophils Relative 9 (*) 0 - 5 (%)   Eosinophils Absolute 0.6  0.0 - 0.7 (K/uL)   Basophils Relative 1  0 - 1 (%)   Basophils Absolute 0.0  0.0 - 0.1 (K/uL)  BASIC METABOLIC PANEL     Status: Abnormal   Collection Time   07/04/11  9:45 AM  Component Value Range   Sodium 123 (*) 135 - 145 (mEq/L)   Potassium 3.8  3.5 - 5.1 (mEq/L)   Chloride 90 (*) 96 - 112 (mEq/L)   CO2 24  19 - 32 (mEq/L)   Glucose, Bld 84  70 - 99 (mg/dL)   BUN 6  6 - 23 (mg/dL)   Creatinine, Ser 1.61  0.50 - 1.35 (mg/dL)   Calcium 8.7  8.4 - 09.6 (mg/dL)   GFR calc non Af Amer 55 (*) >90 (mL/min)   GFR calc Af Amer 64 (*) >90 (mL/min)  CARDIAC PANEL(CRET KIN+CKTOT+MB+TROPI)     Status: Normal   Collection Time   07/04/11  9:45 AM      Component Value Range   Total CK 79  7 - 232 (U/L)   CK, MB 3.5  0.3 - 4.0 (ng/mL)   Troponin I <0.30  <0.30 (ng/mL)   Relative Index RELATIVE INDEX IS INVALID  0.0 - 2.5   URINALYSIS, ROUTINE W REFLEX MICROSCOPIC     Status: Abnormal   Collection Time   07/04/11  1:33 PM      Component Value Range   Color, Urine YELLOW  YELLOW    APPearance CLOUDY (*) CLEAR    Specific Gravity, Urine 1.011  1.005 - 1.030    pH 6.5  5.0 - 8.0    Glucose, UA NEGATIVE  NEGATIVE (mg/dL)   Hgb urine dipstick NEGATIVE  NEGATIVE    Bilirubin Urine NEGATIVE  NEGATIVE    Ketones, ur NEGATIVE  NEGATIVE (mg/dL)   Protein, ur NEGATIVE  NEGATIVE (mg/dL)   Urobilinogen, UA 1.0  0.0 - 1.0 (mg/dL)   Nitrite POSITIVE (*)  NEGATIVE    Leukocytes, UA LARGE (*) NEGATIVE   URINE MICROSCOPIC-ADD ON     Status: Abnormal   Collection Time   07/04/11  1:33 PM      Component Value Range   WBC, UA 21-50  <3 (WBC/hpf)   RBC / HPF 0-2  <3 (RBC/hpf)   Bacteria, UA MANY (*) RARE   CREATININE, URINE, RANDOM     Status: Normal   Collection Time   07/04/11  1:33 PM      Component Value Range   Creatinine, Urine 107.98    SODIUM, URINE, RANDOM     Status: Normal   Collection Time   07/04/11  1:33 PM      Component Value Range   Sodium, Ur 27    OSMOLALITY, URINE     Status: Abnormal   Collection Time   07/04/11  1:33 PM      Component Value Range   Osmolality, Ur 260 (*) 390 - 1090 (mOsm/kg)  MRSA PCR SCREENING     Status: Abnormal   Collection Time   07/04/11  6:21 PM      Component Value Range   MRSA by PCR POSITIVE (*) NEGATIVE   CBC     Status: Abnormal   Collection Time   07/04/11  6:26 PM      Component Value Range   WBC 5.9  4.0 - 10.5 (K/uL)   RBC 4.09 (*) 4.22 - 5.81 (MIL/uL)   Hemoglobin 12.6 (*) 13.0 - 17.0 (g/dL)   HCT 04.5 (*) 40.9 - 52.0 (%)   MCV 87.5  78.0 - 100.0 (fL)   MCH 30.8  26.0 - 34.0 (pg)   MCHC 35.2  30.0 - 36.0 (g/dL)   RDW 81.1  91.4 - 78.2 (%)   Platelets 236  150 -  400 (K/uL)  HEPATIC FUNCTION PANEL     Status: Abnormal   Collection Time   07/04/11  6:26 PM      Component Value Range   Total Protein 5.8 (*) 6.0 - 8.3 (g/dL)   Albumin 2.5 (*) 3.5 - 5.2 (g/dL)   AST 20  0 - 37 (U/L)   ALT 12  0 - 53 (U/L)   Alkaline Phosphatase 74  39 - 117 (U/L)   Total Bilirubin 0.7  0.3 - 1.2 (mg/dL)   Bilirubin, Direct 0.2  0.0 - 0.3 (mg/dL)   Indirect Bilirubin 0.5  0.3 - 0.9 (mg/dL)  MAGNESIUM     Status: Normal   Collection Time   07/04/11  6:26 PM      Component Value Range   Magnesium 1.7  1.5 - 2.5 (mg/dL)  PHOSPHORUS     Status: Normal   Collection Time   07/04/11  6:26 PM      Component Value Range   Phosphorus 2.8  2.3 - 4.6 (mg/dL)  APTT     Status: Normal   Collection  Time   07/04/11  6:26 PM      Component Value Range   aPTT 32  24 - 37 (seconds)  TSH     Status: Normal   Collection Time   07/04/11  6:26 PM      Component Value Range   TSH 2.178  0.350 - 4.500 (uIU/mL)  CARDIAC PANEL(CRET KIN+CKTOT+MB+TROPI)     Status: Abnormal   Collection Time   07/04/11  6:26 PM      Component Value Range   Total CK 77  7 - 232 (U/L)   CK, MB 4.1 (*) 0.3 - 4.0 (ng/mL)   Troponin I <0.30  <0.30 (ng/mL)   Relative Index RELATIVE INDEX IS INVALID  0.0 - 2.5   OSMOLALITY     Status: Abnormal   Collection Time   07/04/11  6:26 PM      Component Value Range   Osmolality 253 (*) 275 - 300 (mOsm/kg)  BASIC METABOLIC PANEL     Status: Abnormal   Collection Time   07/04/11  6:26 PM      Component Value Range   Sodium 124 (*) 135 - 145 (mEq/L)   Potassium 3.7  3.5 - 5.1 (mEq/L)   Chloride 92 (*) 96 - 112 (mEq/L)   CO2 23  19 - 32 (mEq/L)   Glucose, Bld 86  70 - 99 (mg/dL)   BUN 5 (*) 6 - 23 (mg/dL)   Creatinine, Ser 1.61  0.50 - 1.35 (mg/dL)   Calcium 8.3 (*) 8.4 - 10.5 (mg/dL)   GFR calc non Af Amer 62 (*) >90 (mL/min)   GFR calc Af Amer 72 (*) >90 (mL/min)  PROTIME-INR     Status: Normal   Collection Time   07/04/11  6:26 PM      Component Value Range   Prothrombin Time 15.0  11.6 - 15.2 (seconds)   INR 1.16  0.00 - 1.49   PRO B NATRIURETIC PEPTIDE     Status: Abnormal   Collection Time   07/04/11  6:26 PM      Component Value Range   Pro B Natriuretic peptide (BNP) 3662.0 (*) 0 - 450 (pg/mL)  CARDIAC PANEL(CRET KIN+CKTOT+MB+TROPI)     Status: Abnormal   Collection Time   07/05/11  2:18 AM      Component Value Range   Total CK 76  7 - 232 (U/L)  CK, MB 4.2 (*) 0.3 - 4.0 (ng/mL)   Troponin I <0.30  <0.30 (ng/mL)   Relative Index RELATIVE INDEX IS INVALID  0.0 - 2.5   COMPREHENSIVE METABOLIC PANEL     Status: Abnormal   Collection Time   07/05/11  2:18 AM      Component Value Range   Sodium 121 (*) 135 - 145 (mEq/L)   Potassium 3.2 (*) 3.5 - 5.1  (mEq/L)   Chloride 88 (*) 96 - 112 (mEq/L)   CO2 22  19 - 32 (mEq/L)   Glucose, Bld 93  70 - 99 (mg/dL)   BUN 5 (*) 6 - 23 (mg/dL)   Creatinine, Ser 8.11  0.50 - 1.35 (mg/dL)   Calcium 8.3 (*) 8.4 - 10.5 (mg/dL)   Total Protein 5.8 (*) 6.0 - 8.3 (g/dL)   Albumin 2.5 (*) 3.5 - 5.2 (g/dL)   AST 17  0 - 37 (U/L)   ALT 12  0 - 53 (U/L)   Alkaline Phosphatase 75  39 - 117 (U/L)   Total Bilirubin 0.7  0.3 - 1.2 (mg/dL)   GFR calc non Af Amer 66 (*) >90 (mL/min)   GFR calc Af Amer 76 (*) >90 (mL/min)  CBC     Status: Abnormal   Collection Time   07/05/11  2:18 AM      Component Value Range   WBC 6.5  4.0 - 10.5 (K/uL)   RBC 4.35  4.22 - 5.81 (MIL/uL)   Hemoglobin 13.6  13.0 - 17.0 (g/dL)   HCT 91.4 (*) 78.2 - 52.0 (%)   MCV 86.7  78.0 - 100.0 (fL)   MCH 31.3  26.0 - 34.0 (pg)   MCHC 36.1 (*) 30.0 - 36.0 (g/dL)   RDW 95.6  21.3 - 08.6 (%)   Platelets 238  150 - 400 (K/uL)    llo: Recent Results (from the past 240 hour(s))  MRSA PCR SCREENING     Status: Abnormal   Collection Time   07/04/11  6:21 PM      Component Value Range Status Comment   MRSA by PCR POSITIVE (*) NEGATIVE  Final     Studies/Results: Dg Chest 2 View  07/04/2011  *RADIOLOGY REPORT*  Clinical Data: Cough and shortness of breath.  CHEST - 2 VIEW  Comparison: 04/26/2011.  Findings: Trachea is midline.  Heart size stable.  Pacemaker lead tips project over the right atrium and right ventricle.  There is interstitial prominence and indistinctness bilaterally, new from 04/26/2011.  There may be a tiny right pleural effusion.  Biapical pleural thickening.  IMPRESSION: Pulmonary edema and possible tiny right pleural effusion.  Original Report Authenticated By: Reyes Ivan, M.D.   Ct Head Wo Contrast  07/04/2011  *RADIOLOGY REPORT*  Clinical Data: Hyponatremia, altered level of consciousness and confusion.  CT HEAD WITHOUT CONTRAST  Technique:  Contiguous axial images were obtained from the base of the skull through  the vertex without contrast.  Comparison: None.  Findings: Small vessel ischemic changes are present in the periventricular white matter. The brain demonstrates no evidence of hemorrhage, infarction, edema, mass effect, extra-axial fluid collection, hydrocephalus or mass lesion.  The skull is unremarkable.  IMPRESSION: No acute abnormalities.  Small vessel disease present.  Original Report Authenticated By: Reola Calkins, M.D.   Portable Chest 1 View  07/05/2011  *RADIOLOGY REPORT*  Clinical Data: Shortness of breath, fever question pneumonia  PORTABLE CHEST - 1 VIEW  Comparison: Portable exam 0555 hours compared to  07/04/2011  Findings: Right subclavian sequential transveous pacemaker leads project at right atrium and right ventricle. Enlargement of cardiac silhouette post median sternotomy. Diffuse interstitial infiltrates bilaterally question edema. Scattered atelectasis right upper lobe and at bases. Left costophrenic angle excluded. No pneumothorax. Bones demineralized.  IMPRESSION: Enlargement cardiac silhouette post median sternotomy and pacemaker. Persistent bilateral pulmonary infiltrates with scattered subsegmental atelectasis.  Original Report Authenticated By: Lollie Marrow, M.D.    Medications:  Scheduled Meds:   . albuterol-ipratropium  2 puff Inhalation BID  . aspirin  325 mg Oral Daily  . cefTRIAXone (ROCEPHIN)  IV  1 g Intravenous Once  . cefTRIAXone (ROCEPHIN)  IV  1 g Intravenous Q24H  . Chlorhexidine Gluconate Cloth  6 each Topical Q0600  . enoxaparin  40 mg Subcutaneous Q24H  . fluticasone  2 spray Each Nare BID  . Fluticasone-Salmeterol  1 puff Inhalation Q12H  . furosemide  40 mg Intravenous Q12H  . furosemide  40 mg Oral Daily  . gabapentin  300 mg Oral TID  . loratadine  10 mg Oral Daily  . losartan  50 mg Oral Daily  . magnesium chloride  1 tablet Oral BID  . mupirocin ointment  1 application Nasal BID  . omega-3 acid ethyl esters  1 g Oral Daily  . pantoprazole   40 mg Oral Q0600  . potassium chloride  20 mEq Oral BID  . predniSONE  20 mg Oral QAC breakfast  . silodosin  8 mg Oral Q breakfast  . simvastatin  40 mg Oral Daily  . sodium chloride  1,000 mL Intravenous Once  . sodium chloride  500 mL Intravenous Once  . sodium chloride  3 mL Intravenous Q12H  . tiotropium  18 mcg Inhalation Daily  . DISCONTD: Chlorhexidine Gluconate Cloth  6 each Topical Q0600  . DISCONTD: Chlorhexidine Gluconate Cloth  6 each Topical Q0600  . DISCONTD: furosemide  40 mg Intravenous Once  . DISCONTD: Magnesium Chloride  1 tablet Oral BID  . DISCONTD: mupirocin ointment  1 application Nasal BID  . DISCONTD: potassium chloride  10 mEq Oral Daily   Continuous Infusions:   . DISCONTD: sodium chloride     PRN Meds:.acetaminophen, acetaminophen, alum & mag hydroxide-simeth, ondansetron (ZOFRAN) IV, ondansetron, oxyCODONE, senna-docusate Evening when he and   Assessment:   #1 hyponatremia  dilutional hyponatremia . May be secondary to volume depletion versus volume overload. Continue in  step down unit. Chest x-ray with questionable pulmonary edema. However patient appears clinically dry. Will check a urine sodium, urine creatinine , urine osmolality, serum osmolality, orthostatic blood pressures, TSH. Will check a CT of the head. Will give gentle hydration with normal saline at 75 cc per hour x24 hours then saline lock. Will check serial BMET.Will give IV Lasix now, increase losartan to 75 mg daily (50 am and 25 pm) with potential to titrate to 50 bid if renal fxn tolerates. Rec BNP and CXR. Will follow.   #2 urinary tract infection  Urine cultures are pending. IV Rocephin.  #3 lower extremity edema  Questionable right-sided heart failure. Will check a BNP. We'll cycle cardiac enzymes every 8 hours x3. Patient with recent cardiac catheterization with EF of 30-35% a such will hold off on a 2-D echo. Continue home dose Lasix for now while labs are pending. Appreciate  cardiology input.    #4 coronary artery disease status post CABG/ischemic cardiomyopathy EF of 30-35% per cardiac catheterization of January 2013  We'll cycle cardiac enzymes every 8  hours x3. Will check a BNP. Continue home regimen of Lasix, losartan, aspirin,    #5 nonsustained V. tach  We'll cycle cardiac enzymes every 8 hours x3. Check a magnesium level. Follow electrolytes and keep potassium greater than 4. Will consult with cardiology. Interrogate pacemaker.    #6 COPD  Stable. Continue Spiriva, and inhalers.  #7 status post pacemaker  Interrogate pacemaker.  #8 gastroesophageal reflux disease  PPI.  #9 prophylaxis  PPI for GI prophylaxis. Lovenox for DVT prophylaxis.     LOS: 1 day   [facility] 07/05/2011, 8:34 AM

## 2011-07-05 NOTE — Evaluation (Signed)
Physical Therapy Evaluation Patient Details Name: Richard Chavez MRN: [id number] DOB: August 21, 1926 Today's Date: 07/05/2011  Problem List:  Patient Active Problem List  Diagnoses  . Hypertension  . Heart attack  . NSVT reported in ER  . Hypercholesteremia  . Allergic rhinitis  . COPD (chronic obstructive pulmonary disease)  . GERD (gastroesophageal reflux disease)  . Barrett's esophagus  . Hyponatremia, 123 on admission, (same as 06/30/11)  . UTI (lower urinary tract infection)  . CAD CABG 1998, patent grafts 06/06/11 after abn Myoview  . Syncope  . Pacemaker, St Jude, implanted 2008  . Cardiomyopathy, ischemic, EF 30-35% by recent cath  . Edema of both legs  . Acute pulmonary edema, documented on CXR    Past Medical History:  Past Medical History  Diagnosis Date  . CAD (coronary artery disease)   . Hypertension   . Hyperlipidemia   . GERD (gastroesophageal reflux disease)   . COPD (chronic obstructive pulmonary disease)   . CRF (chronic renal failure)   . Barrett's esophagus   . OP (osteoporosis)   . CAD (coronary artery disease) of artery bypass graft 07/04/2011    LIMA-LAD; SVG-OM2-OM3, SVG-RCA -- all patent as of 05/27/11  . Syncope 07/04/2011  . Pacemaker 07/04/2011  . Cardiomyopathy, ischemic 07/04/2011   Past Surgical History:  Past Surgical History  Procedure Date  . Pacemaker placement 2007  . Cholecystectomy 2005  . Coronary artery bypass graft 1978    PT Assessment/Plan/Recommendation PT Assessment Clinical Impression Statement: 78 y.o. male admitted with hyponatremia, BLE edema, productive cough. Pt states he was independent with mobility/ADLs at home using a cane. Pt denied h/o falls.  Pt now with some impaired memory. Expect pt will be able to return home  if cognititve level improves.  Pt would benefit from acute PT to maximize safety and independence with mobility.  PT Recommendation/Assessment: Patient will need skilled PT in the acute care venue PT Problem  List: Decreased knowledge of use of DME;Decreased activity tolerance;Decreased safety awareness Barriers to Discharge: None PT Therapy Diagnosis : Generalized weakness PT Plan PT Frequency: Min 3X/week PT Treatment/Interventions: Gait training;DME instruction;Therapeutic activities;Therapeutic exercise;Balance training;Patient/family education PT Recommendation Recommendations for Other Services: OT consult Follow Up Recommendations: Home health PT Equipment Recommended: Rolling walker with 5" wheels (RW if needed, depending on progress) PT Goals  Acute Rehab PT Goals PT Goal Formulation: With patient Time For Goal Achievement: 2 weeks Pt will go Supine/Side to Sit: with modified independence;with HOB 0 degrees PT Goal: Supine/Side to Sit - Progress: Goal set today Pt will go Sit to Supine/Side: with modified independence;with HOB 0 degrees PT Goal: Sit to Supine/Side - Progress: Goal set today Pt will Ambulate: 51 - 150 feet;with supervision;with least restrictive assistive device PT Goal: Ambulate - Progress: Goal set today Pt will Perform Home Exercise Program: Independently PT Goal: Perform Home Exercise Program - Progress: Goal set today  PT Evaluation Precautions/Restrictions  Precautions Precautions: Fall Prior Functioning  Home Living Lives With: Sheran Spine Help From: Friend(s) (pt has minimal support at home per MD) Type of Home: House Home Layout: One level Home Access: Stairs to enter Entrance Stairs-Rails: None (holds door) Secretary/administrator of Steps: 2 Bathroom Shower/Tub: Engineer, manufacturing systems: Standard Home Adaptive Equipment: Grab bars in shower;Grab bars around toilet Prior Function Level of Independence: Independent with basic ADLs;Independent with homemaking with ambulation;Independent with gait;Independent with transfers Able to Take Stairs?: Yes Driving: Yes Vocation: Retired Financial risk analyst Arousal/Alertness:  Awake/alert Overall Cognitive Status: Impaired Memory:  Appears impaired Memory Deficits: pt reports more assist at home than MD confirms he has, unable to give familiar MD's name, confusing recent medical hx, off topic conversation Orientation Level: Oriented X4 Sensation/Coordination Coordination Gross Motor Movements are Fluid and Coordinated: Yes Fine Motor Movements are Fluid and Coordinated: Yes Extremity Assessment RUE Assessment RUE Assessment: Within Functional Limits LUE Assessment LUE Assessment: Within Functional Limits RLE Assessment RLE Assessment: Within Functional Limits LLE Assessment LLE Assessment: Within Functional Limits Mobility (including Balance) Bed Mobility Bed Mobility: No Transfers Sit to Stand: 4: Min assist Stand to Sit: 4: Min assist Ambulation/Gait Ambulation/Gait: Yes Ambulation/Gait Assistance: 4: Min assist Ambulation/Gait Assistance Details (indicate cue type and reason): min/guard assist for safety and to manage RW Ambulation Distance (Feet): 20 Feet Assistive device: Rolling walker Gait Pattern: Step-through pattern  Balance Balance Assessed: Yes Dynamic Standing Balance Dynamic Standing - Level of Assistance: 4: Min assist (pt normally doesn't use AD, but is now a bit unsteady) Exercise    End of Session PT - End of Session Equipment Utilized During Treatment: Gait belt Activity Tolerance: Patient tolerated treatment well Patient left: in chair;with call bell in reach Nurse Communication: Mobility status for transfers;Mobility status for ambulation General Behavior During Session: [facility] for tasks performed Cognition: [facility] for tasks performed  Tamala Ser 07/05/2011, 11:27 AM [phone]

## 2011-07-05 NOTE — Progress Notes (Signed)
Pharmacy Medication Assessment: Tolvaptan [facility])  Discussed therapy with Dr. Susie Cassette.   Patient meets criteria for tolvaptan: Na <125 and trending down, had fluid challenge yesterday without response and CHF so can not handle fluid overload, not anuria, able to sense and respond to thirst, does not require urgent need to raise serum sodium acutely, and MD avoiding use of demeclocycline for a patient with CHF.   Richard Chavez, PharmD, BCPS Clinical Pharmacist [phone] 07/05/2011, 9:23 AM

## 2011-07-05 NOTE — Clinical Documentation Improvement (Signed)
CHF DOCUMENTATION CLARIFICATION QUERY  THIS DOCUMENT IS NOT A PERMANENT PART OF THE MEDICAL RECORD  TO RESPOND TO THE THIS QUERY, FOLLOW THE INSTRUCTIONS BELOW:  1. If needed, update documentation for the patient's encounter via the notes activity.  2. Access this query again and click edit on the In [employer].  3. After updating, or not, click F2 to complete all highlighted (required) fields concerning your review. Select "additional documentation in the medical record" OR "no additional documentation provided".  4. Click Sign note button.  5. The deficiency will fall out of your In Basket *Please let us know if you are not able to complete this workflow by phone or e-mail (listed below).  Please update your documentation within the medical record to reflect your response to this query.                                                                                    07/05/11  Dear Dr. Susie Cassette / Associates,  In a better effort to capture your patient's severity of illness, reflect appropriate length of stay and utilization of resources, a review of the patient medical record has revealed the following indicators the diagnosis of Heart Failure.  : THANK YOU FOR RESPONDING IN NOTES/DC SUMMARY.  Possible Clinical Conditions? . Chronic Systolic Congestive Heart Failure . Chronic Diastolic Congestive Heart Failure . Chronic Systolic  & Diastolic Congestive Heart Failure . Acute Systolic Congestive Heart Failure . Acute  Diastolic Congestive Heart Failure . Acute  Systolic  & Diastolic Congestive Heart Failure . Acute on Chronic Systolic Congestive Heart Failure . Acute  on Chronic Diastolic Congestive Heart Failure . Acute  on Chronic Systolic  & Diastolic Congestive Heart Failure . Other Condition . Cannot Clinically Determine  Supporting Information: - Risk Factors: 2/27: "hyponatremia...  chronic CHF is likely cause", CHF on admission, "Acute pulmonary edema", bilateral leg  edema - EF: 30% - BNP:  3662 - CXR: CHF, acute pulmonary edema w/pleural effusion  Reviewed: additional documentation in the medical record  Thank You,  Beverley Fiedler RN Clinical Documentation Specialist: CE[facility]:  [phone]  Health Information Management [place]

## 2011-07-05 NOTE — Progress Notes (Signed)
Utilization review completed.  

## 2011-07-05 NOTE — Progress Notes (Signed)
MD notified of pt's 7-sec ventricular standstill with pacemaker. On further assessment of pt, Richard Chavez was drowsy but arousable. Oriented x4. SBP 90s/50s. No new orders. Will continue to monitor. [name]

## 2011-07-05 NOTE — Progress Notes (Signed)
[facility]  DAILY PROGRESS NOTE   78 y.o. male with a past medical history significant for CAD. He had CABG in 1998. He was just cathed 06/06/11 after an abnormal Myoview. Cath by showed patent grafts with an EF of 30-35%. Plan was for continued medical Rx with OP Echo.  S/p PPM for CHB in 2008 - frequen tmultifocal PVCs noted on ECG &  Telemetry. He is admitted now through the ER with a complaint of not feeling well for 3 days. He denies any increase in his chronic SOB. His serum Na+ was noted to be 123 from 2/23 (was 134 on 1/24 pre-cath). He was treated with IVF in ER. Despite CXR showing CHF on admission he does not appear to in very SOB.  Given IV Lasix yesterday.  Subjective:  Not SOB, no orthopnea Says - he's having a hard time with BMs, no dysuria  Objective:  Temp:  [97.2 F (36.2 C)-98.1 F (36.7 C)] 97.2 F (36.2 C) (02/28 0807) Pulse Rate:  [43-117] 85  (02/28 0807) Resp:  [13-23] 16  (02/28 0807) BP: (110-155)/(48-86) 130/49 mmHg (02/28 0800) SpO2:  [93 %-98 %] 95 % (02/28 0807) Weight:  [106.6 kg (235 lb 0.2 oz)-107.4 kg (236 lb 12.4 oz)] 106.6 kg (235 lb 0.2 oz) (02/28 0200) Weight change:   Intake/Output from previous day: 02/27 0701 - 02/28 0700 In: 374 [P.O.:320; IV Piggyback:54] Out: 2500 [Urine:2500]  Intake/Output from this shift:    Medications: Current Facility-Administered Medications  Medication Dose Route Frequency Provider Last Rate Last Dose  . acetaminophen (TYLENOL) tablet 650 mg  650 mg Oral Q6H PRN Ramiro Harvest, MD       Or  . acetaminophen (TYLENOL) suppository 650 mg  650 mg Rectal Q6H PRN Ramiro Harvest, MD      . albuterol-ipratropium (COMBIVENT) inhaler 2 puff  2 puff Inhalation BID Ramiro Harvest, MD   2 puff at 07/05/11 [phone]  . alum & mag hydroxide-simeth (MAALOX/MYLANTA) 200-200-20 MG/5ML suspension 30 mL  30 mL Oral Q6H PRN Ramiro Harvest, MD      . aspirin tablet 325 mg  325 mg Oral Daily Ramiro Harvest, MD   325 mg at 07/04/11 1934  . cefTRIAXone (ROCEPHIN) 1 g in dextrose 5 % 50 mL IVPB  1 g Intravenous Once Nat Christen, MD   1 g at 07/04/11 1505  . cefTRIAXone (ROCEPHIN) 1 g in dextrose 5 % 50 mL IVPB  1 g Intravenous Q24H Ramiro Harvest, MD   1 g at 07/04/11 1600  . Chlorhexidine Gluconate Cloth 2 % PADS 6 each  6 each Topical Q0600 Richarda Overlie, MD      . enoxaparin (LOVENOX) injection 40 mg  40 mg Subcutaneous Q24H Ramiro Harvest, MD   40 mg at 07/04/11 1954  . fluticasone (FLONASE) 50 MCG/ACT nasal spray 2 spray  2 spray Each Nare BID Ramiro Harvest, MD   2 spray at 07/04/11 2159  . Fluticasone-Salmeterol (ADVAIR) 500-50 MCG/DOSE inhaler 1 puff  1 puff Inhalation Q12H Ramiro Harvest, MD   1 puff at 07/05/11 [phone]  . furosemide (LASIX) injection 40 mg  40 mg Intravenous Q12H Lennette Bihari, MD   40 mg at 07/05/11 2130  . furosemide (LASIX) tablet 40 mg  40 mg Oral Daily Ramiro Harvest, MD      . gabapentin (NEURONTIN) capsule 300 mg  300 mg Oral TID Ramiro Harvest, MD   300 mg at 07/04/11 1953  . loratadine (CLARITIN)  tablet 10 mg  10 mg Oral Daily Ramiro Harvest, MD   10 mg at 07/04/11 2155  . losartan (COZAAR) tablet 50 mg  50 mg Oral Daily Ramiro Harvest, MD   50 mg at 07/04/11 1953  . magnesium chloride (SLOW-MAG) 64 MG SR tablet 64 mg  1 tablet Oral BID Richarda Overlie, MD   64 mg at 07/04/11 2330  . mupirocin ointment (BACTROBAN) 2 % 1 application  1 application Nasal BID Richarda Overlie, MD   1 application at 07/04/11 2159  . omega-3 acid ethyl esters (LOVAZA) capsule 1 g  1 g Oral Daily Ramiro Harvest, MD   1 g at 07/04/11 1953  . ondansetron (ZOFRAN) tablet 4 mg  4 mg Oral Q6H PRN Ramiro Harvest, MD       Or  . ondansetron [facility]) injection 4 mg  4 mg Intravenous Q6H PRN Ramiro Harvest, MD      . oxyCODONE (Oxy IR/ROXICODONE) immediate release tablet 5 mg  5 mg Oral Q4H PRN Ramiro Harvest, MD      . pantoprazole (PROTONIX) EC tablet 40 mg  40 mg Oral Q0600  Ramiro Harvest, MD   40 mg at 07/05/11 3244  . potassium chloride SA (K-DUR,KLOR-CON) CR tablet 40 mEq  40 mEq Oral BID Richarda Overlie, MD      . predniSONE (DELTASONE) tablet 20 mg  20 mg Oral QAC breakfast Ramiro Harvest, MD      . senna-docusate (Senokot-S) tablet 1 tablet  1 tablet Oral QHS PRN Ramiro Harvest, MD   1 tablet at 07/05/11 0102  . silodosin (RAPAFLO) capsule 8 mg  8 mg Oral Q breakfast Ramiro Harvest, MD      . simvastatin (ZOCOR) tablet 40 mg  40 mg Oral Daily Ramiro Harvest, MD   40 mg at 07/04/11 1953  . sodium chloride 0.9 % bolus 1,000 mL  1,000 mL Intravenous Once Nat Christen, MD   1,000 mL at 07/04/11 1310  . sodium chloride 0.9 % bolus 500 mL  500 mL Intravenous Once Nat Christen, MD   1,000 mL at 07/04/11 0950  . sodium chloride 0.9 % injection 3 mL  3 mL Intravenous Q12H Ramiro Harvest, MD   3 mL at 07/04/11 2200  . tiotropium (SPIRIVA) inhalation capsule 18 mcg  18 mcg Inhalation Daily Ramiro Harvest, MD   18 mcg at 07/05/11 [phone]  . tolvaptan (SAMSCA) tablet 15 mg  15 mg Oral Q24H Richarda Overlie, MD        Physical Exam: BP 130/49  Pulse 85  Temp(Src) 97.2 F (36.2 C) (Oral)  Resp 16  Ht 6\' 2"  (1.88 m)  Wt 106.6 kg (235 lb 0.2 oz)  BMI 30.17 kg/m2  SpO2 95% General appearance: alert, appears stated age, distracted, no distress, mildly obese, slowed mentation and pleasant mood as usual but has trouble with recall (more so than at his baseline) Neck: no carotid bruit, no JVD, supple, symmetrical, trachea midline and + HJR Lungs: rales bibasilar and non-labored Heart: regular rate and rhythm, S1, S2 normal, no murmur, click, rub or gallop and distant Heart Sounds -- soft murmurs would not be audible. Abdomen: normal findings: bowel sounds normal, no organomegaly and spleen non-palpable and abnormal findings:  mildly tender, "bloated"  Extremities: edema ~1-2T to ankles Neurologic: Mental status: Alert, oriented, thought content appropriate,  alertness: alert & talkative as usual, orientation: person, place, not sure of date/time, see above Cranial nerves: normal  Lab Results: Results for orders placed during  the hospital encounter of 07/04/11 (from the past 48 hour(s))  GLUCOSE, CAPILLARY     Status: Normal   Collection Time   07/04/11  9:09 AM      Component Value Range Comment   Glucose-Capillary 90  70 - 99 (mg/dL)   CBC     Status: Abnormal   Collection Time   07/04/11  9:45 AM      Component Value Range Comment   WBC 6.3  4.0 - 10.5 (K/uL)    RBC 4.26  4.22 - 5.81 (MIL/uL)    Hemoglobin 13.4  13.0 - 17.0 (g/dL)    HCT 11.9 (*) 14.7 - 52.0 (%)    MCV 86.6  78.0 - 100.0 (fL)    MCH 31.5  26.0 - 34.0 (pg)    MCHC 36.3 (*) 30.0 - 36.0 (g/dL)    RDW 82.9  56.2 - 13.0 (%)    Platelets 254  150 - 400 (K/uL)   DIFFERENTIAL     Status: Abnormal   Collection Time   07/04/11  9:45 AM      Component Value Range Comment   Neutrophils Relative 60  43 - 77 (%)    Neutro Abs 3.8  1.7 - 7.7 (K/uL)    Lymphocytes Relative 19  12 - 46 (%)    Lymphs Abs 1.2  0.7 - 4.0 (K/uL)    Monocytes Relative 12  3 - 12 (%)    Monocytes Absolute 0.8  0.1 - 1.0 (K/uL)    Eosinophils Relative 9 (*) 0 - 5 (%)    Eosinophils Absolute 0.6  0.0 - 0.7 (K/uL)    Basophils Relative 1  0 - 1 (%)    Basophils Absolute 0.0  0.0 - 0.1 (K/uL)   BASIC METABOLIC PANEL     Status: Abnormal   Collection Time   07/04/11  9:45 AM      Component Value Range Comment   Sodium 123 (*) 135 - 145 (mEq/L)    Potassium 3.8  3.5 - 5.1 (mEq/L)    Chloride 90 (*) 96 - 112 (mEq/L)    CO2 24  19 - 32 (mEq/L)    Glucose, Bld 84  70 - 99 (mg/dL)    BUN 6  6 - 23 (mg/dL)    Creatinine, Ser 8.65  0.50 - 1.35 (mg/dL)    Calcium 8.7  8.4 - 10.5 (mg/dL)    GFR calc non Af Amer 55 (*) >90 (mL/min)    GFR calc Af Amer 64 (*) >90 (mL/min)   CARDIAC PANEL(CRET KIN+CKTOT+MB+TROPI)     Status: Normal   Collection Time   07/04/11  9:45 AM      Component Value Range Comment    Total CK 79  7 - 232 (U/L)    CK, MB 3.5  0.3 - 4.0 (ng/mL)    Troponin I <0.30  <0.30 (ng/mL)    Relative Index RELATIVE INDEX IS INVALID  0.0 - 2.5    URINALYSIS, ROUTINE W REFLEX MICROSCOPIC     Status: Abnormal   Collection Time   07/04/11  1:33 PM      Component Value Range Comment   Color, Urine YELLOW  YELLOW     APPearance CLOUDY (*) CLEAR     Specific Gravity, Urine 1.011  1.005 - 1.030     pH 6.5  5.0 - 8.0     Glucose, UA NEGATIVE  NEGATIVE (mg/dL)    Hgb urine dipstick NEGATIVE  NEGATIVE  Bilirubin Urine NEGATIVE  NEGATIVE     Ketones, ur NEGATIVE  NEGATIVE (mg/dL)    Protein, ur NEGATIVE  NEGATIVE (mg/dL)    Urobilinogen, UA 1.0  0.0 - 1.0 (mg/dL)    Nitrite POSITIVE (*) NEGATIVE     Leukocytes, UA LARGE (*) NEGATIVE    URINE MICROSCOPIC-ADD ON     Status: Abnormal   Collection Time   07/04/11  1:33 PM      Component Value Range Comment   WBC, UA 21-50  <3 (WBC/hpf) SOME IN CLUMPS.   RBC / HPF 0-2  <3 (RBC/hpf)    Bacteria, UA MANY (*) RARE    CREATININE, URINE, RANDOM     Status: Normal   Collection Time   07/04/11  1:33 PM      Component Value Range Comment   Creatinine, Urine 107.98     SODIUM, URINE, RANDOM     Status: Normal   Collection Time   07/04/11  1:33 PM      Component Value Range Comment   Sodium, Ur 27     OSMOLALITY, URINE     Status: Abnormal   Collection Time   07/04/11  1:33 PM      Component Value Range Comment   Osmolality, Ur 260 (*) 390 - 1090 (mOsm/kg)   MRSA PCR SCREENING     Status: Abnormal   Collection Time   07/04/11  6:21 PM      Component Value Range Comment   MRSA by PCR POSITIVE (*) NEGATIVE    CBC     Status: Abnormal   Collection Time   07/04/11  6:26 PM      Component Value Range Comment   WBC 5.9  4.0 - 10.5 (K/uL)    RBC 4.09 (*) 4.22 - 5.81 (MIL/uL)    Hemoglobin 12.6 (*) 13.0 - 17.0 (g/dL)    HCT 16.1 (*) 09.6 - 52.0 (%)    MCV 87.5  78.0 - 100.0 (fL)    MCH 30.8  26.0 - 34.0 (pg)    MCHC 35.2  30.0 - 36.0  (g/dL)    RDW 04.5  40.9 - 81.1 (%)    Platelets 236  150 - 400 (K/uL)   HEPATIC FUNCTION PANEL     Status: Abnormal   Collection Time   07/04/11  6:26 PM      Component Value Range Comment   Total Protein 5.8 (*) 6.0 - 8.3 (g/dL)    Albumin 2.5 (*) 3.5 - 5.2 (g/dL)    AST 20  0 - 37 (U/L)    ALT 12  0 - 53 (U/L)    Alkaline Phosphatase 74  39 - 117 (U/L)    Total Bilirubin 0.7  0.3 - 1.2 (mg/dL)    Bilirubin, Direct 0.2  0.0 - 0.3 (mg/dL)    Indirect Bilirubin 0.5  0.3 - 0.9 (mg/dL)   MAGNESIUM     Status: Normal   Collection Time   07/04/11  6:26 PM      Component Value Range Comment   Magnesium 1.7  1.5 - 2.5 (mg/dL)   PHOSPHORUS     Status: Normal   Collection Time   07/04/11  6:26 PM      Component Value Range Comment   Phosphorus 2.8  2.3 - 4.6 (mg/dL)   APTT     Status: Normal   Collection Time   07/04/11  6:26 PM      Component Value Range Comment   aPTT  32  24 - 37 (seconds)   TSH     Status: Normal   Collection Time   07/04/11  6:26 PM      Component Value Range Comment   TSH 2.178  0.350 - 4.500 (uIU/mL)   CARDIAC PANEL(CRET KIN+CKTOT+MB+TROPI)     Status: Abnormal   Collection Time   07/04/11  6:26 PM      Component Value Range Comment   Total CK 77  7 - 232 (U/L)    CK, MB 4.1 (*) 0.3 - 4.0 (ng/mL)    Troponin I <0.30  <0.30 (ng/mL)    Relative Index RELATIVE INDEX IS INVALID  0.0 - 2.5    OSMOLALITY     Status: Abnormal   Collection Time   07/04/11  6:26 PM      Component Value Range Comment   Osmolality 253 (*) 275 - 300 (mOsm/kg)   BASIC METABOLIC PANEL     Status: Abnormal   Collection Time   07/04/11  6:26 PM      Component Value Range Comment   Sodium 124 (*) 135 - 145 (mEq/L)    Potassium 3.7  3.5 - 5.1 (mEq/L)    Chloride 92 (*) 96 - 112 (mEq/L)    CO2 23  19 - 32 (mEq/L)    Glucose, Bld 86  70 - 99 (mg/dL)    BUN 5 (*) 6 - 23 (mg/dL)    Creatinine, Ser 1.61  0.50 - 1.35 (mg/dL)    Calcium 8.3 (*) 8.4 - 10.5 (mg/dL)    GFR calc non Af Amer 62  (*) >90 (mL/min)    GFR calc Af Amer 72 (*) >90 (mL/min)   PROTIME-INR     Status: Normal   Collection Time   07/04/11  6:26 PM      Component Value Range Comment   Prothrombin Time 15.0  11.6 - 15.2 (seconds)    INR 1.16  0.00 - 1.49    PRO B NATRIURETIC PEPTIDE     Status: Abnormal   Collection Time   07/04/11  6:26 PM      Component Value Range Comment   Pro B Natriuretic peptide (BNP) 3662.0 (*) 0 - 450 (pg/mL)   CARDIAC PANEL(CRET KIN+CKTOT+MB+TROPI)     Status: Abnormal   Collection Time   07/05/11  2:18 AM      Component Value Range Comment   Total CK 76  7 - 232 (U/L)    CK, MB 4.2 (*) 0.3 - 4.0 (ng/mL)    Troponin I <0.30  <0.30 (ng/mL)    Relative Index RELATIVE INDEX IS INVALID  0.0 - 2.5    COMPREHENSIVE METABOLIC PANEL     Status: Abnormal   Collection Time   07/05/11  2:18 AM      Component Value Range Comment   Sodium 121 (*) 135 - 145 (mEq/L)    Potassium 3.2 (*) 3.5 - 5.1 (mEq/L)    Chloride 88 (*) 96 - 112 (mEq/L)    CO2 22  19 - 32 (mEq/L)    Glucose, Bld 93  70 - 99 (mg/dL)    BUN 5 (*) 6 - 23 (mg/dL)    Creatinine, Ser 0.96  0.50 - 1.35 (mg/dL)    Calcium 8.3 (*) 8.4 - 10.5 (mg/dL)    Total Protein 5.8 (*) 6.0 - 8.3 (g/dL)    Albumin 2.5 (*) 3.5 - 5.2 (g/dL)    AST 17  0 - 37 (U/L)  ALT 12  0 - 53 (U/L)    Alkaline Phosphatase 75  39 - 117 (U/L)    Total Bilirubin 0.7  0.3 - 1.2 (mg/dL)    GFR calc non Af Amer 66 (*) >90 (mL/min)    GFR calc Af Amer 76 (*) >90 (mL/min)   CBC     Status: Abnormal   Collection Time   07/05/11  2:18 AM      Component Value Range Comment   WBC 6.5  4.0 - 10.5 (K/uL)    RBC 4.35  4.22 - 5.81 (MIL/uL)    Hemoglobin 13.6  13.0 - 17.0 (g/dL)    HCT 16.1 (*) 09.6 - 52.0 (%)    MCV 86.7  78.0 - 100.0 (fL)    MCH 31.3  26.0 - 34.0 (pg)    MCHC 36.1 (*) 30.0 - 36.0 (g/dL)    RDW 04.5  40.9 - 81.1 (%)    Platelets 238  150 - 400 (K/uL)     Imaging:  CXR today with diffuse bi-basal interstitial infiltrates consistent with  Pulmonary edema, enlarged cardiac sillhouette. CT Head 2/27 - small vessel changes, no acute abnormalities.  Assessment/Plan  Principal Problem:   *Hyponatremia, 123 on admission, (same as 06/30/11) Active Problems:   UTI (lower urinary tract infection)   CAD CABG 1998, patent grafts 06/06/11 after abn Myoview   Cardiomyopathy, ischemic, EF 30-35% by recent cath   Hypertension   Hypercholesteremia   COPD (chronic obstructive pulmonary disease)   Pacemaker, [employer], implanted 2008   Acute pulmonary edema, documented on CXR   NSVT reported in ER   GERD (gastroesophageal reflux disease)   Edema of both legs History of Urge Incontinence, Urinary frequency, BPH - all followed by [facility] Also has history of groin abscess I& D last fall. NSVT reported in ER - evaluation of PPM does not support this --> will set up to monitor for VT vs. Pacemaker induced Tachycardia   Low Urine & serum osmolality - FeNa 0.2% -- would suggest pre-renal source; chronic CHF is likely cause.  Had serum Na of 123 on 2/23 but 134 on 1/24 (pre-cath Labs) as well suggesting a more chronic / sub-acute condition.  Admitted b/c "not feeling well" & UA suggests UTI --> appreciate TRH co-management. Intereting scenario as Na dropped overnight --> received NS in ER, followed by ~2L diuresis making it hard to determine the true etiology. With elevated BNP - it is hard to discount Hypervolemic Hyponatremia, therefore the best plan would be fluid / salt restriction & loop diuretics.  Will see how things pan out with current plan of gentle hydration & IV Lasix.  Will add fluid / Na restriction. I do not see medications that are common culprits.  Telemetry suggesting ? Pacemaker induced Tachycardia as well as frequent multifocal PVCs -- have asked [employer] to fix A-V delay to foster intrinsic conduction to evaluate true V Pacing (& extent of CHB) as well as "storage" capability to monitor for VT.  Will ask Dr.  Royann Shivers to review in AM to determine if additional adjustment should be made.  Would like to get near [facility] & titrate up BB to suppress PVCs - as this may me the source of declining EF +/- VT or PMT.    Mr. Amezcua has been a confusing patient -- Ischemic CM, chronically SOB (also has COPD).  He has had 2 LHCs in ~1 year both demonstrating patent grafts.  EF has drifted down.  Palpitations  are most likely PVCs & ? PMT vs short bursts of VT (I do not see strips of VT -- tele in chart is at a faster sweep speed that makes the complexes appear to be faster).  Cardiac biomarkers are normal with the exception of BNP that is elevated, but he denies orthopena or PND & edema is not that bad for him.  He does have Hepato-juglular reflux, but not significant JVD.   Plan:  Fluid restriction with IV diuresis; tolvaptan ordered by [facility] (this is a good indication for this Rx - low threshold for asking for Nephrology assistance if initial therapy is not effective.  Keep K > 4.0.  Had planned to check Echo post-cath (was scheduled to be done as OP) -- will go ahead & order now to correlate with LV Gram EF (frequent PVCs with V-Gram make for difficult interpretation).  PPM adjustments as noted above -- will re-evaluate EF & consider possibility of upgrade to ICD in future if indicated given ? VT (if detected with current device)  Continue Rocephin for UTI (would consider to be a complicated UTI given his urologic history)  COPD - continue Spiriva & Albuterol  Will consider adding cardioselective BB in an attempt to suppress PVCs.  Bowel regimen & monitor for BM  Prophylaxis - PPI, Lovenox.   Have had office notes faxed over.  Time Spent Directly with Patient & Chart: 45 min  Length of Stay:  LOS: 1 day   Orin Eberwein W, M.D., M.S. [facility] [address] [place], [place]  [address]  [phone]  07/05/2011 9:06 AM

## 2011-07-06 DIAGNOSIS — E876 Hypokalemia: Secondary | ICD-10-CM | POA: Diagnosis not present

## 2011-07-06 LAB — URINE CULTURE: Culture  Setup Time: 201302271417

## 2011-07-06 LAB — BASIC METABOLIC PANEL
CO2: 26 mEq/L (ref 19–32)
Calcium: 9.5 mg/dL (ref 8.4–10.5)
Chloride: 95 mEq/L — ABNORMAL LOW (ref 96–112)
Creatinine, Ser: 1.31 mg/dL (ref 0.50–1.35)
GFR calc Af Amer: 57 mL/min — ABNORMAL LOW (ref >90)
GFR calc Af Amer: 59 mL/min — ABNORMAL LOW (ref >90)
GFR calc non Af Amer: 49 mL/min — ABNORMAL LOW (ref >90)
Sodium: 131 mEq/L — ABNORMAL LOW (ref 135–145)
Sodium: 131 mEq/L — ABNORMAL LOW (ref 135–145)

## 2011-07-06 MED ORDER — POTASSIUM CHLORIDE CRYS ER 20 MEQ PO TBCR
60.0000 meq | EXTENDED_RELEASE_TABLET | Freq: Two times a day (BID) | ORAL | Status: DC
  Administered 2011-07-06 – 2011-07-07 (×3): 60 meq via ORAL
  Filled 2011-07-06 (×2): qty 3

## 2011-07-06 NOTE — Progress Notes (Signed)
Subjective: Feels better overall   Objective: Vital signs in last 24 hours: Filed Vitals:   07/06/11 0500 07/06/11 0700 07/06/11 0744 07/06/11 1219  BP: 120/54 111/69 111/69 99/69  Pulse:   76 75  Temp:   97.2 F (36.2 C) 97.4 F (36.3 C)  TempSrc:   Oral Oral  Resp: 16 16 15 16   Height:      Weight: 409.8 kg (227 lb 4.7 oz)     SpO2:   95% 94%    Intake/Output Summary (Last 24 hours) at 07/06/11 1534 Last data filed at 07/06/11 1433  Gross per 24 hour  Intake    695 ml  Output   2225 ml  Net  -1530 ml    Weight change: -4.3 kg (-9 lb 7.7 oz)  General appearance: alert, cooperative and no distress  Neck: no adenopathy, no carotid bruit, no JVD, supple, symmetrical, trachea midline and thyroid not enlarged, symmetric, no tenderness/mass/nodules  Lungs: crackles Lt base  Heart: regular rate and rhythm  Abdomen: obese, non tender  Extremities: 2+ puffy edema bilat  Pulses: 2+ and symmetric  Skin: Skin color, texture, turgor normal. No rashes or lesions  Neurologic: Grossly normal   Lab Results: Results for orders placed during the hospital encounter of 07/04/11 (from the past 24 hour(s))  BASIC METABOLIC PANEL     Status: Abnormal   Collection Time   07/06/11  8:08 AM      Component Value Range   Sodium 131 (*) 135 - 145 (mEq/L)   Potassium 4.1  3.5 - 5.1 (mEq/L)   Chloride 94 (*) 96 - 112 (mEq/L)   CO2 30  19 - 32 (mEq/L)   Glucose, Bld 88  70 - 99 (mg/dL)   BUN 6  6 - 23 (mg/dL)   Creatinine, Ser 1.19  0.50 - 1.35 (mg/dL)   Calcium 9.2  8.4 - 14.7 (mg/dL)   GFR calc non Af Amer 49 (*) >90 (mL/min)   GFR calc Af Amer 57 (*) >90 (mL/min)     Micro: Recent Results (from the past 240 hour(s))  URINE CULTURE     Status: Normal   Collection Time   07/04/11  1:33 PM      Component Value Range Status Comment   Specimen Description URINE, RANDOM   Final    Special Requests NONE   Final    Culture  Setup Time [id number]   Final    Colony Count >=100,000  COLONIES/ML   Final    Culture     Final    Value: STAPHYLOCOCCUS SPECIES (COAGULASE NEGATIVE)     Note: RIFAMPIN AND GENTAMICIN SHOULD NOT BE USED AS SINGLE DRUGS FOR TREATMENT OF STAPH INFECTIONS.   Report Status 07/06/2011 FINAL   Final    Organism ID, Bacteria STAPHYLOCOCCUS SPECIES (COAGULASE NEGATIVE)   Final   MRSA PCR SCREENING     Status: Abnormal   Collection Time   07/04/11  6:21 PM      Component Value Range Status Comment   MRSA by PCR POSITIVE (*) NEGATIVE  Final     Studies/Results: Ct Head Wo Contrast  07/04/2011  *RADIOLOGY REPORT*  Clinical Data: Hyponatremia, altered level of consciousness and confusion.  CT HEAD WITHOUT CONTRAST  Technique:  Contiguous axial images were obtained from the base of the skull through the vertex without contrast.  Comparison: None.  Findings: Small vessel ischemic changes are present in the periventricular white matter. The brain demonstrates no evidence of hemorrhage, infarction,  edema, mass effect, extra-axial fluid collection, hydrocephalus or mass lesion.  The skull is unremarkable.  IMPRESSION: No acute abnormalities.  Small vessel disease present.  Original Report Authenticated By: Reola Calkins, M.D.   Portable Chest 1 View  07/05/2011  *RADIOLOGY REPORT*  Clinical Data: Shortness of breath, fever question pneumonia  PORTABLE CHEST - 1 VIEW  Comparison: Portable exam 0555 hours compared to 07/04/2011  Findings: Right subclavian sequential transveous pacemaker leads project at right atrium and right ventricle. Enlargement of cardiac silhouette post median sternotomy. Diffuse interstitial infiltrates bilaterally question edema. Scattered atelectasis right upper lobe and at bases. Left costophrenic angle excluded. No pneumothorax. Bones demineralized.  IMPRESSION: Enlargement cardiac silhouette post median sternotomy and pacemaker. Persistent bilateral pulmonary infiltrates with scattered subsegmental atelectasis.  Original Report  Authenticated By: Lollie Marrow, M.D.    Medications:  Scheduled Meds:   . albuterol-ipratropium  2 puff Inhalation BID  . aspirin  325 mg Oral Daily  . cefTRIAXone (ROCEPHIN)  IV  1 g Intravenous Q24H  . Chlorhexidine Gluconate Cloth  6 each Topical Q0600  . enoxaparin  40 mg Subcutaneous Q24H  . fluticasone  2 spray Each Nare BID  . Fluticasone-Salmeterol  1 puff Inhalation Q12H  . furosemide  40 mg Oral Daily  . gabapentin  300 mg Oral TID  . loratadine  10 mg Oral Daily  . losartan  50 mg Oral Daily  . magnesium chloride  1 tablet Oral BID  . metoprolol tartrate  25 mg Oral BID  . mupirocin ointment  1 application Nasal BID  . omega-3 acid ethyl esters  1 g Oral Daily  . pantoprazole  40 mg Oral Q0600  . potassium chloride  60 mEq Oral BID  . predniSONE  20 mg Oral QAC breakfast  . silodosin  8 mg Oral Q breakfast  . simvastatin  40 mg Oral Daily  . sodium chloride  3 mL Intravenous Q12H  . tiotropium  18 mcg Inhalation Daily  . tolvaptan  15 mg Oral Q24H  . DISCONTD: furosemide  40 mg Intravenous Q12H  . DISCONTD: potassium chloride  40 mEq Oral BID   Continuous Infusions:  PRN Meds:.acetaminophen, acetaminophen, alum & mag hydroxide-simeth, ondansetron (ZOFRAN) IV, ondansetron, oxyCODONE, senna-docusate   Assessment:   #1 hyponatremia  dilutional hyponatremia now 131 on samsca . May be secondary to volume depletion versus volume overload. Continue in step down unit. Chest x-ray with questionable pulmonary edema. However patient appears clinically dry. Change lasix to PO , increase losartan to 75 mg daily (50 am and 25 pm) with potential to titrate to 50 bid if renal fxn tolerates. Rec BNP and CXR. Will follow.  #2 urinary tract infection  Urine cultures are pending. IV Rocephin.  #3 lower extremity edema  Questionable right-sided heart failure. Will check a BNP. We'll cycle cardiac enzymes every 8 hours x3. Patient with recent cardiac catheterization with EF of  30-35% a such will hold off on a 2-D echo. Continue home dose Lasix for now while labs are pending. Appreciate cardiology input.  #4 coronary artery disease status post CABG/ischemic cardiomyopathy EF of 30-35% per cardiac catheterization of January 2013    Will check a BNP. Continue home regimen of Lasix, losartan, aspirin,  #5 nonsustained V. tach  We'll cycle cardiac enzymes every 8 hours x3. Check a magnesium level. Follow electrolytes and keep potassium greater than 4. Will consult with cardiology. Interrogate pacemaker.  #6 COPD  Stable. Continue Spiriva, and inhalers.  #7  status post pacemaker  Interrogate pacemaker.  #8 gastroesophageal reflux disease  PPI.  #9 prophylaxis  PPI for GI prophylaxis. Lovenox for DVT prophylaxis.     LOS: 2 days   [facility] 07/06/2011, 3:34 PM

## 2011-07-06 NOTE — Progress Notes (Signed)
Subjective:  Feels better overall  Objective:  Vital Signs in the last 24 hours: Temp:  [97 F (36.1 C)-98.3 F (36.8 C)] 97.2 F (36.2 C) (03/01 0744) Pulse Rate:  [70-83] 76  (03/01 0744) Resp:  [13-18] 15  (03/01 0744) BP: (96-125)/(45-69) 111/69 mmHg (03/01 0700) SpO2:  [93 %-96 %] 95 % (03/01 0744) Weight:  [103.1 kg (227 lb 4.7 oz)] 103.1 kg (227 lb 4.7 oz) (03/01 0500)  Intake/Output from previous day:  Intake/Output Summary (Last 24 hours) at 07/06/11 0817 Last data filed at 07/06/11 0700  Gross per 24 hour  Intake   1200 ml  Output   1550 ml  Net   -350 ml    Physical Exam: General appearance: alert, cooperative and no distress Lungs: few rhonchi Heart: regular rate and rhythm   Rate: 70  Rhythm: A-paced  Lab Results:  Basename 07/05/11 0218 07/04/11 1826  WBC 6.5 5.9  HGB 13.6 12.6*  PLT 238 236    Basename 07/05/11 0945 07/05/11 0218  NA 124* 121*  K 3.3* 3.2*  CL 88* 88*  CO2 25 22  GLUCOSE 93 93  BUN 5* 5*  CREATININE 1.03 1.03    Basename 07/05/11 0945 07/05/11 0218  TROPONINI <0.30 <0.30   Hepatic Function Panel  Basename 07/05/11 0218 07/04/11 1826  PROT 5.8* --  ALBUMIN 2.5* --  AST 17 --  ALT 12 --  ALKPHOS 75 --  BILITOT 0.7 --  BILIDIR -- 0.2  IBILI -- 0.5   No results found for this basename: CHOL in the last 72 hours  Basename 07/04/11 1826  INR 1.16    Imaging: Imaging results have been reviewed  Cardiac Studies:  Assessment/Plan:   Principal Problem:  *Hyponatremia, 123 on admission, (same as 06/30/11)    Active Problems:  Acute pulmonary edema, documented on CXR  Cardiomyopathy, ischemic, EF 30-35% by recent cath  Hypokalemia  Hypertension  NSVT reported in ER, pacer adjusted  Hypercholesteremia  COPD (chronic obstructive pulmonary disease)  GERD (gastroesophageal reflux disease)  UTI (lower urinary tract infection)  CAD CABG 1998, patent grafts 06/06/11 after abn Myoview  Pacemaker, [employer],  implanted 2008  Edema of both legs   Plan-Change lasix po, should be OK to transfer out of CCU, MD to see. Todays lab pending   Corine Shelter PA-C 07/06/2011, 8:17 AM  I have seen and examined the patient along with Corine Shelter PA-C.  I have reviewed the chart, notes and new data.  I agree with PA's note.  Key new complaints: much better; denies dyspnea when he walked with assistance, oriented, alert. Key examination changes: minimal edema, JVP hard to see Key new findings / data: lab still pending today  Pacemaker interrogation shows normal function. AV conduction is poor and AVD delay was appropriately extended to minimize Vpacing. Frequent PVCs (maybe worsened by electrolyte abnormalities) but there is NO recorded VT in last 7 days. The "7-second ventricular standstill" reported by nursing is actually a brief period of normal Apaced-Vpaced rhythm.  PLAN: Fluid restriction, oral loop diuretics and Samsca until Na level normalizes. Adjust meds depending on BMET results today.  Thurmon Fair, MD, [facility] [phone] 07/06/2011, 8:39 AM

## 2011-07-07 LAB — BASIC METABOLIC PANEL
Calcium: 9.5 mg/dL (ref 8.4–10.5)
GFR calc Af Amer: 57 mL/min — ABNORMAL LOW (ref >90)
GFR calc non Af Amer: 49 mL/min — ABNORMAL LOW (ref >90)
Glucose, Bld: 94 mg/dL (ref 70–99)
Potassium: 4.7 mEq/L (ref 3.5–5.1)
Sodium: 135 mEq/L (ref 135–145)

## 2011-07-07 MED ORDER — FUROSEMIDE 40 MG PO TABS: 40.0000 mg | ORAL_TABLET | Freq: Two times a day (BID) | ORAL | Status: DC

## 2011-07-07 MED ORDER — DOXYCYCLINE HYCLATE 100 MG PO TABS: 100.0000 mg | ORAL_TABLET | Freq: Two times a day (BID) | ORAL | Status: AC

## 2011-07-07 MED ORDER — POTASSIUM CHLORIDE ER 10 MEQ PO TBCR: 20.0000 meq | EXTENDED_RELEASE_TABLET | Freq: Every day | ORAL | Status: DC

## 2011-07-07 MED ORDER — METOPROLOL TARTRATE 25 MG PO TABS: 25.0000 mg | ORAL_TABLET | Freq: Two times a day (BID) | ORAL | Status: DC

## 2011-07-07 MED ORDER — CIPROFLOXACIN HCL 500 MG PO TABS: 500.0000 mg | ORAL_TABLET | Freq: Two times a day (BID) | ORAL | Status: DC

## 2011-07-07 NOTE — Progress Notes (Signed)
   CARE MANAGEMENT NOTE 07/07/2011  Patient:  Richard Chavez, Richard Chavez   Account Number:  [id number]  Date Initiated:  07/07/2011  Documentation initiated by:  [facility]  Subjective/Objective Assessment:   Hyponatremia, CHF, UTI     Action/Plan:   Anticipated DC Date:  07/07/2011   Anticipated DC Plan:  HOME/SELF CARE      DC Planning Services  CM consult      Choice offered to / List presented to:     DME arranged  Levan Hurst      DME agency  [facility]     [facility] arranged  HH - 11 Patient Refused      Status of service:  Completed, signed off Medicare Important Message given?   (If response is "NO", the following Medicare IM given date fields will be blank) Date Medicare IM given:   Date Additional Medicare IM given:    Discharge Disposition:  HOME/SELF CARE  Per UR Regulation:    Comments:  07/07/2011 1230 Pt states he does not need HH PT/OT. Explained to pt he can contact his PCP office to have [facility] set up if decides it is needed at home.  Contacted [facility] for RW for home for scheduled d/c today. Isidoro Donning RN CCM Case Mgmt phone [phone]

## 2011-07-07 NOTE — Discharge Instructions (Signed)
Fluid restriction to i.5L//DAY 2 GM SALT

## 2011-07-07 NOTE — Progress Notes (Signed)
  Echocardiogram 2D Echocardiogram has been performed.  Richard Chavez 07/07/2011, 9:58 AM

## 2011-07-07 NOTE — Progress Notes (Signed)
07/07/2011 1330 Nursing Note:  Patient discharge AVS form, live RX and one RX called in to pt. Pharmacy given and explained to patient. Medications already taken today and those due this evening also reviewed and marked for patient. CHF educational folder given to patient and explained. Questions and concerns addressed. Importance of weighing daily, low salt diet and when to call doctor reviewed. Patient verbalized understanding. Correct use of rolling walker demonstrated to patient. D/c iv line. D/c tele. D/c home per orders.  Richard Chavez, Blanchard Kelch

## 2011-07-07 NOTE — Discharge Summary (Signed)
Physician Discharge Summary  Richard Chavez MRN: [id number] DOB/AGE: 1926-07-07 78 y.o.  PCP: Dorcas Carrow, MD, MD   Admit date: 07/04/2011 Discharge date: 07/07/2011  Discharge Diagnoses:     *Hyponatremia, 123 on admission, (same as 06/30/11)  UTI date 2 STAPHYLOCOCCUS SPECIES (COAGULASE NEGATIVE) CHF exacerbation  Hypertension  NSVT reported in ER  Hypercholesteremia  COPD (chronic obstructive pulmonary disease)  GERD (gastroesophageal reflux disease)  UTI (lower urinary tract infection)  CAD CABG 1998, patent grafts 06/06/11 after abn Myoview  Pacemaker, St Jude, implanted 2008  Cardiomyopathy, ischemic, EF 30-35% by recent cath  Edema of both legs  Acute pulmonary edema, documented on CXR  Hypokalemia   Medication List  As of 07/07/2011 12:08 PM   TAKE these medications         ADVAIR DISKUS 500-50 MCG/DOSE Aepb   Generic drug: Fluticasone-Salmeterol   Inhale 1 puff into the lungs every 12 (twelve) hours.      aspirin 325 MG tablet   Take 325 mg by mouth daily.      atorvastatin 20 MG tablet   Commonly known as: LIPITOR   Take 20 mg by mouth daily.       doxycycline 100 mg by mouth twice a day for 7 days             COMBIVENT 18-103 MCG/ACT inhaler   Generic drug: albuterol-ipratropium   Inhale 2 puffs into the lungs 2 (two) times daily.      Fish Oil 1000 MG Caps   Take 1 capsule by mouth daily.      fluticasone 50 MCG/ACT nasal spray   Commonly known as: FLONASE   2 sprays in each nostril twice daily      furosemide 40 MG tablet   Commonly known as: LASIX   Take 1 tablet (40 mg total) by mouth 2 (two) times daily.      gabapentin 300 MG capsule   Commonly known as: NEURONTIN   Take 300 mg by mouth 3 (three) times daily.      guaiFENesin 600 MG 12 hr tablet   Commonly known as: MUCINEX   Take 600 mg by mouth daily.      loratadine 10 MG tablet   Commonly known as: CLARITIN   Take 10 mg by mouth daily.      losartan 50 MG tablet   Commonly known as: COZAAR   Take 50 mg by mouth daily.      metoprolol tartrate 25 MG tablet   Commonly known as: LOPRESSOR   Take 1 tablet (25 mg total) by mouth 2 (two) times daily.      potassium chloride 10 MEQ tablet   Commonly known as: K-DUR   Take 2 tablets (20 mEq total) by mouth daily.      predniSONE 5 MG tablet   Commonly known as: DELTASONE   Take 5 mg by mouth daily.      ranitidine 150 MG capsule   Commonly known as: ZANTAC   Take 150 mg by mouth 2 (two) times daily.      RAPAFLO 8 MG Caps capsule   Generic drug: silodosin   Take 8 mg by mouth daily with breakfast. Once daily      SLOW-MAG 535 (64 MG) MG Tbcr   Generic drug: Magnesium Chloride   Take 1 tablet by mouth 2 (two) times daily.      tiotropium 18 MCG inhalation capsule   Commonly known as: SPIRIVA  Place 18 mcg into inhaler and inhale daily.      Vitamin D (Ergocalciferol) 50000 UNITS Caps   Commonly known as: DRISDOL   Take 50,000 Units by mouth every 7 (seven) days. Take on Fridays      vitamin E 1000 UNIT capsule   Take 1,000 Units by mouth daily.            Discharge Condition: Stable Disposition: Home-Health Care Svc   Consults: Cardiology, Thurmon Fair, MD   Significant Diagnostic Studies: Dg Chest 2 View  07/04/2011  *RADIOLOGY REPORT*  Clinical Data: Cough and shortness of breath.  CHEST - 2 VIEW  Comparison: 04/26/2011.  Findings: Trachea is midline.  Heart size stable.  Pacemaker lead tips project over the right atrium and right ventricle.  There is interstitial prominence and indistinctness bilaterally, new from 04/26/2011.  There may be a tiny right pleural effusion.  Biapical pleural thickening.  IMPRESSION: Pulmonary edema and possible tiny right pleural effusion.  Original Report Authenticated By: Reyes Ivan, M.D.   Ct Head Wo Contrast  07/04/2011  *RADIOLOGY REPORT*  Clinical Data: Hyponatremia, altered level of consciousness and confusion.  CT HEAD WITHOUT  CONTRAST  Technique:  Contiguous axial images were obtained from the base of the skull through the vertex without contrast.  Comparison: None.  Findings: Small vessel ischemic changes are present in the periventricular white matter. The brain demonstrates no evidence of hemorrhage, infarction, edema, mass effect, extra-axial fluid collection, hydrocephalus or mass lesion.  The skull is unremarkable.  IMPRESSION: No acute abnormalities.  Small vessel disease present.  Original Report Authenticated By: Reola Calkins, M.D.   Ct Abdomen Pelvis W Contrast  06/30/2011  *RADIOLOGY REPORT*  Clinical Data: Upper abdominal pain, nausea, shortness of breath.  CT ABDOMEN AND PELVIS WITH CONTRAST  Technique:  Multidetector CT imaging of the abdomen and pelvis was performed following the standard protocol during bolus administration of intravenous contrast.  Contrast: OMNIPAQUE IOHEXOL 300 MG/ML IV SOLN  Comparison: Plain films 06/30/2011  Findings: Areas of scarring/fibrosis in the lung bases.  Calcified pleural plaques in the right lung base.  Mild cardiomegaly.  Pacer wires noted in the right heart.  No effusions.  Prior cholecystectomy.  Liver, spleen, pancreas, adrenals and kidneys are unremarkable.  Aorta and iliac vessels are heavily calcified, non-aneurysmal.  Prostate is enlarged.  Urinary bladder is grossly unremarkable.  Descending colonic and sigmoid diverticulosis.  No active diverticulitis.  Small bowel is decompressed, grossly unremarkable. No free fluid, free air or adenopathy.  No acute bony abnormality.  IMPRESSION: Fibrosis and scarring in the lung bases.  Calcified pleural plaques in the right lung base.  Descending colonic and sigmoid diverticulosis.  No acute findings in the abdomen or pelvis.  Original Report Authenticated By: Cyndie Chime, M.D.   Portable Chest 1 View  07/05/2011  *RADIOLOGY REPORT*  Clinical Data: Shortness of breath, fever question pneumonia  PORTABLE CHEST - 1 VIEW   Comparison: Portable exam 0555 hours compared to 07/04/2011  Findings: Right subclavian sequential transveous pacemaker leads project at right atrium and right ventricle. Enlargement of cardiac silhouette post median sternotomy. Diffuse interstitial infiltrates bilaterally question edema. Scattered atelectasis right upper lobe and at bases. Left costophrenic angle excluded. No pneumothorax. Bones demineralized.  IMPRESSION: Enlargement cardiac silhouette post median sternotomy and pacemaker. Persistent bilateral pulmonary infiltrates with scattered subsegmental atelectasis.  Original Report Authenticated By: Lollie Marrow, M.D.   Dg Abd Acute W/chest  06/30/2011  *RADIOLOGY REPORT*  Clinical  Data: Abdominal pain and distention.  Nausea and vomiting. Former smoker with history of COPD.  Prior CABG.  ACUTE ABDOMEN SERIES (ABDOMEN 2 VIEW & CHEST 1 VIEW) 06/30/2011:  Comparison: Two-view chest x-ray 04/26/2011 [facility] and 06/15/2008 [facility].  No prior abdominal imaging.  Findings: Gas within the upper normal caliber colon, with gas and stool noted to the rectum.  Gas within multiple upper normal size to small bowel loops diffusely throughout the abdomen.  No evidence of free air on the erect image.  Air-fluid levels in the colon consistent with liquid stool.  No small bowel air-fluid levels. Aorto-iliofemoral atherosclerosis without a visible aneurysm. Visible psoas margins.  No visible opaque urinary tract calculi. Degenerative changes involving the lower lumbar spine.  Prior sternotomy for CABG.  Cardiac silhouette moderately enlarged but stable.  Left subclavian dual lead transvenous pacemaker unchanged and intact.  Interval development of diffuse interstitial pulmonary opacities since the 04/2011 examination.  Small bilateral pleural effusions.  IMPRESSION:  1.  Mild generalized ileus and/or enterocolitis.  No evidence of bowel obstruction or free intraperitoneal air. 2.  Stable  cardiomegaly.  Diffuse interstitial pulmonary edema and small bilateral pleural effusions is consistent with mild CHF.  Original Report Authenticated By: Arnell Sieving, M.D.    2-D echo was ordered and is pending at this time Microbiology: Recent Results (from the past 240 hour(s))  URINE CULTURE     Status: Normal   Collection Time   07/04/11  1:33 PM      Component Value Range Status Comment   Specimen Description URINE, RANDOM   Final    Special Requests NONE   Final    Culture  Setup Time [id number]   Final    Colony Count >=100,000 COLONIES/ML   Final    Culture     Final    Value: STAPHYLOCOCCUS SPECIES (COAGULASE NEGATIVE)     Note: RIFAMPIN AND GENTAMICIN SHOULD NOT BE USED AS SINGLE DRUGS FOR TREATMENT OF STAPH INFECTIONS.   Report Status 07/06/2011 FINAL   Final    Organism ID, Bacteria STAPHYLOCOCCUS SPECIES (COAGULASE NEGATIVE)   Final   MRSA PCR SCREENING     Status: Abnormal   Collection Time   07/04/11  6:21 PM      Component Value Range Status Comment   MRSA by PCR POSITIVE (*) NEGATIVE  Final      Labs: Results for orders placed during the hospital encounter of 07/04/11 (from the past 48 hour(s))  BASIC METABOLIC PANEL     Status: Abnormal   Collection Time   07/06/11  8:08 AM      Component Value Range Comment   Sodium 131 (*) 135 - 145 (mEq/L)    Potassium 4.1  3.5 - 5.1 (mEq/L)    Chloride 94 (*) 96 - 112 (mEq/L)    CO2 30  19 - 32 (mEq/L)    Glucose, Bld 88  70 - 99 (mg/dL)    BUN 6  6 - 23 (mg/dL)    Creatinine, Ser 9.56  0.50 - 1.35 (mg/dL)    Calcium 9.2  8.4 - 10.5 (mg/dL)    GFR calc non Af Amer 49 (*) >90 (mL/min)    GFR calc Af Amer 57 (*) >90 (mL/min)   BASIC METABOLIC PANEL     Status: Abnormal   Collection Time   07/06/11  4:08 PM      Component Value Range Comment   Sodium 131 (*) 135 - 145 (mEq/L)  Potassium 5.3 (*) 3.5 - 5.1 (mEq/L) HEMOLYSIS AT THIS LEVEL MAY AFFECT RESULT   Chloride 95 (*) 96 - 112 (mEq/L)    CO2 26  19 - 32  (mEq/L)    Glucose, Bld 99  70 - 99 (mg/dL)    BUN 9  6 - 23 (mg/dL)    Creatinine, Ser 1.61  0.50 - 1.35 (mg/dL)    Calcium 9.5  8.4 - 10.5 (mg/dL)    GFR calc non Af Amer 51 (*) >90 (mL/min)    GFR calc Af Amer 59 (*) >90 (mL/min)   BASIC METABOLIC PANEL     Status: Abnormal   Collection Time   07/07/11  6:00 AM      Component Value Range Comment   Sodium 135  135 - 145 (mEq/L)    Potassium 4.7  3.5 - 5.1 (mEq/L)    Chloride 98  96 - 112 (mEq/L)    CO2 27  19 - 32 (mEq/L)    Glucose, Bld 94  70 - 99 (mg/dL)    BUN 10  6 - 23 (mg/dL)    Creatinine, Ser 0.96  0.50 - 1.35 (mg/dL)    Calcium 9.5  8.4 - 10.5 (mg/dL)    GFR calc non Af Amer 49 (*) >90 (mL/min)    GFR calc Af Amer 57 (*) >90 (mL/min)      HPI :78 year old Caucasian gentleman with history of coronary artery disease status post CABG, history of syncope status post pacemaker in 2008 third degree AV block, history of hypertension, hyperlipidemia, COPD, severe ischemic cardiomyopathy with an EF of 30-35% per cardiac catheterization on 06/06/2011, history of gastroesophageal reflux disease who presents to the ED with a 2 to three-day history of just feeling sick. Patient denies any fevers, no chest pain, chronic shortness of breath which is unchanged, no diarrhea, no constipation, no emesis. Patient does endorse feelings of fluttering in his chest which have been intermittent, nausea with dry heaves, decreased oral intake for the past 2-3 days, generalized weakness, productive cough of whitish sputum. Patient denies any dysuria. Patient denies any abdominal pain. Patient was seen in the emergency room urinalysis done was consistent with a urinary tract infection. Basic metabolic profile done showed a sodium of 123 with a chloride of 90. Patient was given a dose of IV Rocephin will call to admit the patient for further evaluation and management.  HOSPITAL COURSE:  Richard Chavez MRN: [id number] DOB/AGE: 12-13-1926 79 y.o. Primary Care  Physician:MCKENZIE,EDWARD B, MD, MD Admit date: 07/04/2011 Chief Complaint: Feeling sick HPI:   Richard Chavez  is a 78 year old Caucasian gentleman with history of coronary artery disease status post CABG, history of syncope status post pacemaker in 2008 third degree AV block, history of hypertension, hyperlipidemia, COPD, severe ischemic cardiomyopathy with an EF of 30-35% per cardiac catheterization on 06/06/2011, history of gastroesophageal reflux disease who presents to the ED with a 2 to three-day history of just feeling sick. Patient denies any fevers, no chest pain, chronic shortness of breath which is unchanged, no diarrhea, no constipation, no emesis. Patient does endorse feelings of fluttering in his chest which have been intermittent, nausea with dry heaves, decreased oral intake for the past 2-3 days, generalized weakness, productive cough of whitish sputum. Patient denies any dysuria. Patient denies any abdominal pain. Patient was seen in the emergency room urinalysis done was consistent with a urinary tract infection. Basic metabolic profile done showed a sodium of 123 with a chloride of 90. Patient  was given a dose of IV Rocephin will call to admit the patient for further evaluation and management.    Past Medical History   Diagnosis  Date   .  CAD (coronary artery disease)     .  Hypertension     .  Hyperlipidemia     .  GERD (gastroesophageal reflux disease)     .  COPD (chronic obstructive pulmonary disease)     .  CRF (chronic renal failure)     .  Barrett's esophagus     .  OP (osteoporosis)     .  CAD (coronary artery disease) of artery bypass graft  07/04/2011   .  Syncope  07/04/2011   .  Pacemaker  07/04/2011   .  Cardiomyopathy, ischemic  07/04/2011       Past Surgical History   Procedure  Date   .  Pacemaker placement  2007   .  Cholecystectomy  2005   .  Coronary artery bypass graft  1978       Prior to Admission medications    Medication  Sig  Start Date  End Date   Taking?  Authorizing Provider   albuterol-ipratropium (COMBIVENT) 18-103 MCG/ACT inhaler  Inhale 2 puffs into the lungs 2 (two) times daily.       Yes  Historical Provider, MD   aspirin 325 MG tablet  Take 325 mg by mouth daily.        Yes  Historical Provider, MD   atorvastatin (LIPITOR) 20 MG tablet  Take 20 mg by mouth daily.        Yes  Historical Provider, MD   fluticasone (FLONASE) 50 MCG/ACT nasal spray  2 sprays in each nostril twice daily  03/09/11    Yes  Historical Provider, MD   Fluticasone-Salmeterol (ADVAIR DISKUS) 500-50 MCG/DOSE AEPB  Inhale 1 puff into the lungs every 12 (twelve) hours.        Yes  Historical Provider, MD   furosemide (LASIX) 40 MG tablet  Take 40 mg by mouth daily.      Yes  Historical Provider, MD   gabapentin (NEURONTIN) 300 MG capsule  Take 300 mg by mouth 3 (three) times daily.   02/07/11    Yes  Historical Provider, MD   guaiFENesin (MUCINEX) 600 MG 12 hr tablet  Take 600 mg by mouth daily.       Yes  Historical Provider, MD   loratadine (CLARITIN) 10 MG tablet  Take 10 mg by mouth daily.      Yes  Historical Provider, MD   losartan (COZAAR) 50 MG tablet  Take 50 mg by mouth daily.        Yes  Historical Provider, MD   Magnesium Chloride (SLOW-MAG) 535 (64 MG) MG TBCR  Take 1 tablet by mouth 2 (two) times daily.        Yes  Historical Provider, MD   Omega-3 Fatty Acids (FISH OIL) 1000 MG CAPS  Take 1 capsule by mouth daily.       Yes  Historical Provider, MD   potassium chloride (K-DUR) 10 MEQ tablet  Take 10 mEq by mouth daily.      Yes  Historical Provider, MD   predniSONE (DELTASONE) 5 MG tablet  Take 5 mg by mouth daily.        Yes  Historical Provider, MD   ranitidine (ZANTAC) 150 MG capsule  Take 150 mg by mouth 2 (two) times daily.  Yes  Historical Provider, MD   RAPAFLO 8 MG CAPS capsule  Take 8 mg by mouth daily with breakfast. Once daily  03/09/11    Yes  Historical Provider, MD   tiotropium (SPIRIVA) 18 MCG inhalation capsule  Place 18 mcg into  inhaler and inhale daily.       Yes  Historical Provider, MD   Vitamin D, Ergocalciferol, (DRISDOL) 50000 UNITS CAPS  Take 50,000 Units by mouth every 7 (seven) days. Take on Fridays      Yes  Historical Provider, MD   vitamin E 1000 UNIT capsule  Take 1,000 Units by mouth daily.        Yes  Historical Provider, MD        Allergies: No Known Allergies    Family History   Problem  Relation  Age of Onset   .  Leukemia  Father     .  Alzheimer's disease  Mother     .  Lung cancer  Son  43   .  Heart disease  Father     .  Arthritis  Mother        Social History: reports that he quit smoking about 34 years ago. His smoking use included Cigarettes. He has a 4.5 pack-year smoking history. He does not have any smokeless tobacco history on file. He reports that he does not drink alcohol or use illicit drugs.   ROS: All systems reviewed with the patient and was positive as per HPI otherwise all other systems are negative.   PHYSICAL EXAM: Blood pressure 121/60, pulse 59, temperature 97.9 F (36.6 C), temperature source Oral, resp. rate 18, SpO2 97.00%. General: Well-developed well-nourished. Alert, awake, oriented x3, in no acute distress. HEENT: Normocephalic atraumatic. Pupils equal round and reactive to light and accommodation. Extraocular movements intact. Oropharynx is clear, no lesions, no exudates. Neck is supple with no lymphadenopathy. No JVD. No bruits, no goiter. Dry mucous membranes Heart: Regular rate and rhythm, without murmurs, rubs, gallops. Lungs: Decreased breath sounds in the bases otherwise Clear to auscultation bilaterally. Abdomen: Soft, nontender, nondistended, positive bowel sounds. Extremities: No clubbing cyanosis. 23+ bilateral lower extremity edema with positive pedal pulses. Neuro: Alert and hard to x3. Cranial nerves II through XII are grossly intact.  nonfocal.       EKG: Normal sinus rhythm with occasional PVCs   No results found for this or any  previous visit (from the past 240 hour(s)).    Lab results: Basename  07/04/11 0945   NA  123*   K  3.8   CL  90*   CO2  24   GLUCOSE  84   BUN  6   CREATININE  1.19   CALCIUM  8.7   MG  --   PHOS  --    No results found for this basename: AST:2,ALT:2,ALKPHOS:2,BILITOT:2,PROT:2,ALBUMIN:2 in the last 72 hours No results found for this basename: LIPASE:2,AMYLASE:2 in the last 72 hours Basename  07/04/11 0945   WBC  6.3   NEUTROABS  3.8   HGB  13.4   HCT  36.9*   MCV  86.6   PLT  254    Basename  07/04/11 0945   CKTOTAL  79   CKMB  3.5   CKMBINDEX  --   TROPONINI  <0.30    No components found with this basename: POCBNP:3 No results found for this basename: DDIMER in the last 72 hours No results found for this basename: HGBA1C:2 in the last  72 hours No results found for this basename: CHOL:2,HDL:2,LDLCALC:2,TRIG:2,CHOLHDL:2,LDLDIRECT:2 in the last 72 hours No results found for this basename: TSH,T4TOTAL,FREET3,T3FREE,THYROIDAB in the last 72 hours No results found for this basename: VITAMINB12:2,FOLATE:2,FERRITIN:2,TIBC:2,IRON:2,RETICCTPCT:2 in the last 72 hours Imaging results:  Dg Chest 2 View   07/04/2011  *RADIOLOGY REPORT*  Clinical Data: Cough and shortness of breath.  CHEST - 2 VIEW  Comparison: 04/26/2011.  Findings: Trachea is midline.  Heart size stable.  Pacemaker lead tips project over the right atrium and right ventricle.  There is interstitial prominence and indistinctness bilaterally, new from 04/26/2011.  There may be a tiny right pleural effusion.  Biapical pleural thickening.  IMPRESSION: Pulmonary edema and possible tiny right pleural effusion.  Original Report Authenticated By: Reyes Ivan, M.D.    Ct Abdomen Pelvis W Contrast   06/30/2011  *RADIOLOGY REPORT*  Clinical Data: Upper abdominal pain, nausea, shortness of breath.  CT ABDOMEN AND PELVIS WITH CONTRAST  Technique:  Multidetector CT imaging of the abdomen and pelvis was performed following  the standard protocol during bolus administration of intravenous contrast.  Contrast: OMNIPAQUE IOHEXOL 300 MG/ML IV SOLN  Comparison: Plain films 06/30/2011  Findings: Areas of scarring/fibrosis in the lung bases.  Calcified pleural plaques in the right lung base.  Mild cardiomegaly.  Pacer wires noted in the right heart.  No effusions.  Prior cholecystectomy.  Liver, spleen, pancreas, adrenals and kidneys are unremarkable.  Aorta and iliac vessels are heavily calcified, non-aneurysmal.  Prostate is enlarged.  Urinary bladder is grossly unremarkable.  Descending colonic and sigmoid diverticulosis.  No active diverticulitis.  Small bowel is decompressed, grossly unremarkable. No free fluid, free air or adenopathy.  No acute bony abnormality.  IMPRESSION: Fibrosis and scarring in the lung bases.  Calcified pleural plaques in the right lung base.  Descending colonic and sigmoid diverticulosis.  No acute findings in the abdomen or pelvis.  Original Report Authenticated By: Cyndie Chime, M.D.    Dg Abd Acute W/chest   06/30/2011  *RADIOLOGY REPORT*  Clinical Data: Abdominal pain and distention.  Nausea and vomiting. Former smoker with history of COPD.  Prior CABG.  ACUTE ABDOMEN SERIES (ABDOMEN 2 VIEW & CHEST 1 VIEW) 06/30/2011:  Comparison: Two-view chest x-ray 04/26/2011 [facility] and 06/15/2008 [facility].  No prior abdominal imaging.  Findings: Gas within the upper normal caliber colon, with gas and stool noted to the rectum.  Gas within multiple upper normal size to small bowel loops diffusely throughout the abdomen.  No evidence of free air on the erect image.  Air-fluid levels in the colon consistent with liquid stool.  No small bowel air-fluid levels. Aorto-iliofemoral atherosclerosis without a visible aneurysm. Visible psoas margins.  No visible opaque urinary tract calculi. Degenerative changes involving the lower lumbar spine.  Prior sternotomy for CABG.  Cardiac  silhouette moderately enlarged but stable.  Left subclavian dual lead transvenous pacemaker unchanged and intact.  Interval development of diffuse interstitial pulmonary opacities since the 04/2011 examination.  Small bilateral pleural effusions.  IMPRESSION:  1.  Mild generalized ileus and/or enterocolitis.  No evidence of bowel obstruction or free intraperitoneal air. 2.  Stable cardiomegaly.  Diffuse interstitial pulmonary edema and small bilateral pleural effusions is consistent with mild CHF.  Original Report Authenticated By: Arnell Sieving, M.D.    Impression/Plan:   Principal Problem:  *Hyponatremia Active Problems:  Hypertension  Abnormal heart rhythms  Hypercholesteremia  COPD (chronic obstructive pulmonary disease)  GERD (gastroesophageal reflux disease)  UTI (  lower urinary tract infection)  CAD (coronary artery disease) of artery bypass graft  Pacemaker  Cardiomyopathy, ischemic  Edema of both legs   #1 hyponatremia, thought to be a combination of CHF exacerbation and dilutional hyponatremia   May be secondary to volume depletion versus volume overload. He was admitted to step down unit. Chest x-ray with questionable pulmonary edema. However patient appears clinically dry.  he was also mildly confused, probably secondary to his hyponatremia, and underlying urinary tract infection. We started him on normal saline at 75 cc per hour x24 hours then saline lock. He was  also started on samsca, and both initiated on IV Lasix for diuresis due to volume overload. The BMP was done that showed improvement with sodium of 135 prior to discharge  #2 urinary tract infection, secondary to staph aureus He was initially treated with IV Rocephin. Subsequently given one dose of vancomycin and switch to doxycycline by mouth for 7 days   #3 lower extremity edema Questionable right-sided heart failure. Will check a BNP. We'll cycle cardiac enzymes every 8 hours x3. Patient with recent cardiac  catheterization with EF of 30-35% a such will hold off on a 2-D echo. Continue home dose Lasix for now while labs are pending. Will consult with cardiology. Cardiology was consulted. Dr.Mihai Croitoru, MD, [facility] saw the patient prior to discharge    #4 coronary artery disease status post CABG/ischemic cardiomyopathy EF of 30-35% per cardiac catheterization of January 2013  cardiac enzymes every 8 hours x3 remained negative. We continued him on his Lasix regimen along with losartan, aspirin, .   #5 nonsustained V. tach/Pacemaker, St Jude, implanted 2008    cardiac enzymes every 8 hours x3 remained negative. Check a magnesium level. Follow electrolytes and keep potassium greater than 4. Will consult with cardiology. Interrogate pacemaker.   #6 COPD Stable. Continue Spiriva, and inhalers.   #7 status post pacemaker Interrogate pacemaker.   #8 gastroesophageal reflux disease PPI.        Discharge Exam:  Blood pressure 120/81, pulse 97, temperature 97.8 F (36.6 C), temperature source Oral, resp. rate 20, height 6\' 2"  (1.88 m), weight 101.379 kg (223 lb 8 oz), SpO2 94.00%.  General appearance: alert, cooperative and no distress  Lungs: clear to auscultation bilaterally  Heart: regular rate and rhythm, S1, S2 normal, no murmur, click, rub or gallop  Extremities: edema 1+ ankles  Skin: Skin color, texture, turgor normal. No rashes or lesions  Neurologic: Alert and oriented X 3, normal strength and tone. Normal symmetric reflexes. Normal coordination and gait       Discharge Orders    Future Orders Please Complete By Expires   Diet - low sodium heart healthy      Check Pulse Oximetry while ambulating      Increase activity slowly         Follow-up Information    Follow up with pcp in 1 week. (bmp in one week )          Signed: Richarda Overlie 07/07/2011, 12:08 PM

## 2011-07-07 NOTE — Progress Notes (Signed)
Subjective:  Neurologically back to normal. Walked without assistance. No dyspnea or angina.  Objective:  Vital Signs in the last 24 hours: Temp:  [97.3 F (36.3 C)-97.8 F (36.6 C)] 97.8 F (36.6 C) (03/02 0500) Pulse Rate:  [70-97] 97  (03/02 1047) Resp:  [16-23] 20  (03/02 0500) BP: (99-140)/(69-90) 120/81 mmHg (03/02 1047) SpO2:  [94 %-98 %] 94 % (03/02 0500) Weight:  [101.379 kg (223 lb 8 oz)] 101.379 kg (223 lb 8 oz) (03/02 0500)  Intake/Output from previous day:  Intake/Output Summary (Last 24 hours) at 07/07/11 1201 Last data filed at 07/06/11 1716  Gross per 24 hour  Intake    415 ml  Output    975 ml  Net   -560 ml    Physical Exam: General appearance: alert, cooperative and no distress Lungs: clear to auscultation bilaterally Heart: regular rate and rhythm, S1, S2 normal, no murmur, click, rub or gallop Extremities: edema 1+ ankles Skin: Skin color, texture, turgor normal. No rashes or lesions Neurologic: Alert and oriented X 3, normal strength and tone. Normal symmetric reflexes. Normal coordination and gait   Rate: 70  Rhythm: A-paced  Lab Results:  Basename 07/05/11 0218 07/04/11 1826  WBC 6.5 5.9  HGB 13.6 12.6*  PLT 238 236    Basename 07/07/11 0600 07/06/11 1608  NA 135 131*  K 4.7 5.3*  CL 98 95*  CO2 27 26  GLUCOSE 94 99  BUN 10 9  CREATININE 1.30 1.27    Basename 07/05/11 0945 07/05/11 0218  TROPONINI <0.30 <0.30   Hepatic Function Panel  Basename 07/05/11 0218 07/04/11 1826  PROT 5.8* --  ALBUMIN 2.5* --  AST 17 --  ALT 12 --  ALKPHOS 75 --  BILITOT 0.7 --  BILIDIR -- 0.2  IBILI -- 0.5   No results found for this basename: CHOL in the last 72 hours  Basename 07/04/11 1826  INR 1.16    Imaging: Imaging results have been reviewed  Cardiac Studies:  Assessment/Plan:   Principal Problem:  *Hyponatremia, 123 on admission, (same as 06/30/11)    Active Problems:  Acute pulmonary edema, documented on CXR  Cardiomyopathy, ischemic, EF 30-35% by recent cath  Hypokalemia  Hypertension  NSVT reported in ER, pacer adjusted  Hypercholesteremia  COPD (chronic obstructive pulmonary disease)  GERD (gastroesophageal reflux disease)  UTI (lower urinary tract infection)  CAD CABG 1998, patent grafts 06/06/11 after abn Myoview  Pacemaker, St Jude, implanted 2008  Edema of both legs  Na=135  Ready for DC from cardiac standpoint. Would resume home CHF meds at discharge. Probably wise to recheck Na level in 1-2 weeks.  Thurmon Fair, MD, [facility] [phone] 07/07/2011, 12:01 PM

## 2011-07-21 ENCOUNTER — Other Ambulatory Visit: Payer: Self-pay

## 2011-07-21 ENCOUNTER — Encounter (HOSPITAL_COMMUNITY): Payer: Self-pay

## 2011-07-21 ENCOUNTER — Emergency Department (HOSPITAL_COMMUNITY): Payer: Medicare Other

## 2011-07-21 ENCOUNTER — Inpatient Hospital Stay (HOSPITAL_COMMUNITY)
Admission: EM | Admit: 2011-07-21 | Discharge: 2011-07-24 | DRG: 312 | Disposition: A | Discharge status: Home / Self Care | Payer: Medicare Other | Attending: Internal Medicine | Admitting: Internal Medicine

## 2011-07-21 DIAGNOSIS — I6529 Occlusion and stenosis of unspecified carotid artery: Secondary | ICD-10-CM | POA: Diagnosis present

## 2011-07-21 DIAGNOSIS — J4489 Other specified chronic obstructive pulmonary disease: Secondary | ICD-10-CM | POA: Diagnosis present

## 2011-07-21 DIAGNOSIS — Z7982 Long term (current) use of aspirin: Secondary | ICD-10-CM

## 2011-07-21 DIAGNOSIS — M810 Age-related osteoporosis without current pathological fracture: Secondary | ICD-10-CM | POA: Diagnosis present

## 2011-07-21 DIAGNOSIS — R5381 Other malaise: Secondary | ICD-10-CM | POA: Diagnosis present

## 2011-07-21 DIAGNOSIS — J449 Chronic obstructive pulmonary disease, unspecified: Secondary | ICD-10-CM | POA: Diagnosis present

## 2011-07-21 DIAGNOSIS — I2589 Other forms of chronic ischemic heart disease: Secondary | ICD-10-CM | POA: Diagnosis present

## 2011-07-21 DIAGNOSIS — Z87891 Personal history of nicotine dependence: Secondary | ICD-10-CM

## 2011-07-21 DIAGNOSIS — I2510 Atherosclerotic heart disease of native coronary artery without angina pectoris: Secondary | ICD-10-CM | POA: Diagnosis present

## 2011-07-21 DIAGNOSIS — I4729 Other ventricular tachycardia: Secondary | ICD-10-CM | POA: Diagnosis present

## 2011-07-21 DIAGNOSIS — E871 Hypo-osmolality and hyponatremia: Secondary | ICD-10-CM | POA: Diagnosis present

## 2011-07-21 DIAGNOSIS — Z79899 Other long term (current) drug therapy: Secondary | ICD-10-CM

## 2011-07-21 DIAGNOSIS — R55 Syncope and collapse: Secondary | ICD-10-CM

## 2011-07-21 DIAGNOSIS — N179 Acute kidney failure, unspecified: Secondary | ICD-10-CM | POA: Diagnosis present

## 2011-07-21 DIAGNOSIS — Z950 Presence of cardiac pacemaker: Secondary | ICD-10-CM

## 2011-07-21 DIAGNOSIS — I951 Orthostatic hypotension: Secondary | ICD-10-CM

## 2011-07-21 DIAGNOSIS — E860 Dehydration: Secondary | ICD-10-CM | POA: Diagnosis present

## 2011-07-21 DIAGNOSIS — Z951 Presence of aortocoronary bypass graft: Secondary | ICD-10-CM

## 2011-07-21 DIAGNOSIS — I658 Occlusion and stenosis of other precerebral arteries: Secondary | ICD-10-CM | POA: Diagnosis present

## 2011-07-21 DIAGNOSIS — I255 Ischemic cardiomyopathy: Secondary | ICD-10-CM | POA: Diagnosis present

## 2011-07-21 DIAGNOSIS — IMO0002 Reserved for concepts with insufficient information to code with codable children: Secondary | ICD-10-CM

## 2011-07-21 DIAGNOSIS — N289 Disorder of kidney and ureter, unspecified: Secondary | ICD-10-CM | POA: Diagnosis present

## 2011-07-21 DIAGNOSIS — K219 Gastro-esophageal reflux disease without esophagitis: Secondary | ICD-10-CM | POA: Diagnosis present

## 2011-07-21 DIAGNOSIS — E7800 Pure hypercholesterolemia, unspecified: Secondary | ICD-10-CM | POA: Diagnosis present

## 2011-07-21 DIAGNOSIS — E785 Hyperlipidemia, unspecified: Secondary | ICD-10-CM | POA: Diagnosis present

## 2011-07-21 DIAGNOSIS — I472 Ventricular tachycardia, unspecified: Secondary | ICD-10-CM | POA: Diagnosis present

## 2011-07-21 DIAGNOSIS — I10 Essential (primary) hypertension: Secondary | ICD-10-CM | POA: Diagnosis present

## 2011-07-21 DIAGNOSIS — E876 Hypokalemia: Secondary | ICD-10-CM | POA: Diagnosis present

## 2011-07-21 DIAGNOSIS — I25810 Atherosclerosis of coronary artery bypass graft(s) without angina pectoris: Secondary | ICD-10-CM | POA: Diagnosis present

## 2011-07-21 LAB — CBC
Hemoglobin: 13.1 g/dL (ref 13.0–17.0)
MCH: 31.9 pg (ref 26.0–34.0)
MCH: 30.9 pg (ref 26.0–34.0)
MCV: 87.8 fL (ref 78.0–100.0)
Platelets: 179 10*3/uL (ref 150–400)
Platelets: 170 10*3/uL (ref 150–400)
RBC: 4.27 MIL/uL (ref 4.22–5.81)
RBC: 4.24 MIL/uL (ref 4.22–5.81)
WBC: 13 10*3/uL — ABNORMAL HIGH (ref 4.0–10.5)

## 2011-07-21 LAB — MAGNESIUM: Magnesium: 2.1 mg/dL (ref 1.5–2.5)

## 2011-07-21 LAB — DIFFERENTIAL
Eosinophils Absolute: 0.1 10*3/uL (ref 0.0–0.7)
Eosinophils Relative: 1 % (ref 0–5)
Lymphs Abs: 1.3 10*3/uL (ref 0.7–4.0)
Monocytes Absolute: 0.9 10*3/uL (ref 0.1–1.0)

## 2011-07-21 LAB — BASIC METABOLIC PANEL
BUN: 73 mg/dL — ABNORMAL HIGH (ref 6–23)
Calcium: 8.8 mg/dL (ref 8.4–10.5)
Creatinine, Ser: 2.67 mg/dL — ABNORMAL HIGH (ref 0.50–1.35)
GFR calc non Af Amer: 21 mL/min — ABNORMAL LOW (ref >90)
Glucose, Bld: 96 mg/dL (ref 70–99)
Sodium: 126 mEq/L — ABNORMAL LOW (ref 135–145)

## 2011-07-21 LAB — MRSA PCR SCREENING: MRSA by PCR: NEGATIVE

## 2011-07-21 LAB — POCT I-STAT TROPONIN I

## 2011-07-21 MED ORDER — SODIUM CHLORIDE 0.9 % IV BOLUS (SEPSIS)
1000.0000 mL | Freq: Once | INTRAVENOUS | Status: AC
  Administered 2011-07-21: 1000 mL via INTRAVENOUS

## 2011-07-21 MED ORDER — FLUTICASONE PROPIONATE 50 MCG/ACT NA SUSP
1.0000 | Freq: Every day | NASAL | Status: DC
  Administered 2011-07-21 – 2011-07-24 (×3): 1 via NASAL
  Filled 2011-07-21: qty 16

## 2011-07-21 MED ORDER — ASPIRIN 325 MG PO TABS
325.0000 mg | ORAL_TABLET | Freq: Every day | ORAL | Status: DC
  Administered 2011-07-22 – 2011-07-24 (×3): 325 mg via ORAL
  Filled 2011-07-21 (×3): qty 1

## 2011-07-21 MED ORDER — SODIUM CHLORIDE 0.9 % IV SOLN: INTRAVENOUS | Status: DC

## 2011-07-21 MED ORDER — ENOXAPARIN SODIUM 30 MG/0.3ML ~~LOC~~ SOLN
30.0000 mg | SUBCUTANEOUS | Status: DC
  Administered 2011-07-21 – 2011-07-23 (×3): 30 mg via SUBCUTANEOUS
  Filled 2011-07-21 (×4): qty 0.3

## 2011-07-21 MED ORDER — ONDANSETRON HCL 4 MG/2ML IJ SOLN: 4.0000 mg | Freq: Four times a day (QID) | INTRAMUSCULAR | Status: DC | PRN

## 2011-07-21 MED ORDER — POTASSIUM CHLORIDE 10 MEQ/100ML IV SOLN
10.0000 meq | Freq: Once | INTRAVENOUS | Status: AC
  Administered 2011-07-21: 10 meq via INTRAVENOUS
  Filled 2011-07-21: qty 100

## 2011-07-21 MED ORDER — IPRATROPIUM-ALBUTEROL 18-103 MCG/ACT IN AERO
2.0000 | INHALATION_SPRAY | Freq: Two times a day (BID) | RESPIRATORY_TRACT | Status: DC
  Administered 2011-07-21 – 2011-07-23 (×5): 2 via RESPIRATORY_TRACT
  Filled 2011-07-21: qty 14.7

## 2011-07-21 MED ORDER — VITAMIN D (ERGOCALCIFEROL) 1.25 MG (50000 UNIT) PO CAPS: 50000.0000 [IU] | ORAL_CAPSULE | ORAL | Status: DC

## 2011-07-21 MED ORDER — SIMVASTATIN 40 MG PO TABS
40.0000 mg | ORAL_TABLET | Freq: Every day | ORAL | Status: DC
  Administered 2011-07-21 – 2011-07-24 (×4): 40 mg via ORAL
  Filled 2011-07-21 (×4): qty 1

## 2011-07-21 MED ORDER — ACETAMINOPHEN 650 MG RE SUPP: 650.0000 mg | Freq: Four times a day (QID) | RECTAL | Status: DC | PRN

## 2011-07-21 MED ORDER — MAGNESIUM CHLORIDE ER 535 (64 MG) MG PO TBCR: 1.0000 | EXTENDED_RELEASE_TABLET | Freq: Two times a day (BID) | ORAL | Status: DC

## 2011-07-21 MED ORDER — VITAMIN E 45 MG (100 UNIT) PO CAPS
1000.0000 [IU] | ORAL_CAPSULE | Freq: Every day | ORAL | Status: DC
  Administered 2011-07-21 – 2011-07-23 (×3): 1000 [IU] via ORAL
  Filled 2011-07-21 (×4): qty 2

## 2011-07-21 MED ORDER — MORPHINE SULFATE 2 MG/ML IJ SOLN: 1.0000 mg | INTRAMUSCULAR | Status: DC | PRN

## 2011-07-21 MED ORDER — LORATADINE 10 MG PO TABS
10.0000 mg | ORAL_TABLET | Freq: Every day | ORAL | Status: DC
  Administered 2011-07-22 – 2011-07-24 (×3): 10 mg via ORAL
  Filled 2011-07-21 (×4): qty 1

## 2011-07-21 MED ORDER — SODIUM CHLORIDE 0.9 % IJ SOLN
3.0000 mL | Freq: Two times a day (BID) | INTRAMUSCULAR | Status: DC
  Administered 2011-07-21 – 2011-07-24 (×5): 3 mL via INTRAVENOUS

## 2011-07-21 MED ORDER — PREDNISONE 5 MG PO TABS
5.0000 mg | ORAL_TABLET | Freq: Every day | ORAL | Status: DC
  Administered 2011-07-21 – 2011-07-24 (×4): 5 mg via ORAL
  Filled 2011-07-21 (×4): qty 1

## 2011-07-21 MED ORDER — ONDANSETRON HCL 4 MG/2ML IJ SOLN: 4.0000 mg | Freq: Three times a day (TID) | INTRAMUSCULAR | Status: DC | PRN

## 2011-07-21 MED ORDER — SODIUM CHLORIDE 0.9 % IV SOLN
INTRAVENOUS | Status: DC
  Administered 2011-07-21: 22:00:00 via INTRAVENOUS

## 2011-07-21 MED ORDER — OMEGA-3-ACID ETHYL ESTERS 1 G PO CAPS
1.0000 g | ORAL_CAPSULE | Freq: Every day | ORAL | Status: DC
  Administered 2011-07-21 – 2011-07-24 (×4): 1 g via ORAL
  Filled 2011-07-21 (×5): qty 1

## 2011-07-21 MED ORDER — POTASSIUM CHLORIDE ER 10 MEQ PO TBCR
20.0000 meq | EXTENDED_RELEASE_TABLET | Freq: Two times a day (BID) | ORAL | Status: DC
  Administered 2011-07-22 – 2011-07-23 (×4): 20 meq via ORAL
  Filled 2011-07-21 (×8): qty 2

## 2011-07-21 MED ORDER — GABAPENTIN 300 MG PO CAPS
300.0000 mg | ORAL_CAPSULE | Freq: Three times a day (TID) | ORAL | Status: DC
  Administered 2011-07-21 – 2011-07-24 (×8): 300 mg via ORAL
  Filled 2011-07-21 (×10): qty 1

## 2011-07-21 MED ORDER — TIOTROPIUM BROMIDE MONOHYDRATE 18 MCG IN CAPS
18.0000 ug | ORAL_CAPSULE | Freq: Every day | RESPIRATORY_TRACT | Status: DC
  Administered 2011-07-22 – 2011-07-23 (×2): 18 ug via RESPIRATORY_TRACT
  Filled 2011-07-21: qty 5

## 2011-07-21 MED ORDER — ACETAMINOPHEN 325 MG PO TABS: 650.0000 mg | ORAL_TABLET | Freq: Four times a day (QID) | ORAL | Status: DC | PRN

## 2011-07-21 MED ORDER — MAGNESIUM CHLORIDE 64 MG PO TBEC
1.0000 | DELAYED_RELEASE_TABLET | Freq: Two times a day (BID) | ORAL | Status: DC
  Administered 2011-07-21 – 2011-07-24 (×6): 64 mg via ORAL
  Filled 2011-07-21 (×7): qty 1

## 2011-07-21 MED ORDER — ONDANSETRON HCL 4 MG PO TABS: 4.0000 mg | ORAL_TABLET | Freq: Four times a day (QID) | ORAL | Status: DC | PRN

## 2011-07-21 MED ORDER — SILODOSIN 8 MG PO CAPS
8.0000 mg | ORAL_CAPSULE | Freq: Every day | ORAL | Status: DC
Start: 2011-07-22 — End: 2011-07-24
  Administered 2011-07-22 – 2011-07-24 (×3): 8 mg via ORAL
  Filled 2011-07-21 (×4): qty 1

## 2011-07-21 MED ORDER — FLUTICASONE-SALMETEROL 500-50 MCG/DOSE IN AEPB
1.0000 | INHALATION_SPRAY | Freq: Two times a day (BID) | RESPIRATORY_TRACT | Status: DC
  Administered 2011-07-21 – 2011-07-23 (×5): 1 via RESPIRATORY_TRACT
  Filled 2011-07-21: qty 14

## 2011-07-21 MED ORDER — PANTOPRAZOLE SODIUM 40 MG PO TBEC
40.0000 mg | DELAYED_RELEASE_TABLET | Freq: Two times a day (BID) | ORAL | Status: DC
  Administered 2011-07-22 – 2011-07-24 (×5): 40 mg via ORAL
  Filled 2011-07-21 (×6): qty 1

## 2011-07-21 MED ORDER — POTASSIUM CHLORIDE CRYS ER 20 MEQ PO TBCR
40.0000 meq | EXTENDED_RELEASE_TABLET | ORAL | Status: AC
  Administered 2011-07-21: 40 meq via ORAL

## 2011-07-21 NOTE — ED Notes (Signed)
Pt returned from xray

## 2011-07-21 NOTE — ED Notes (Signed)
Pt denies dizziness or weakness, son states he looks a lot better than he did this morning at home

## 2011-07-21 NOTE — ED Provider Notes (Signed)
Medical screening examination/treatment/procedure(s) were conducted as a shared visit with non-physician practitioner(s) and myself.  I personally evaluated the patient during the encounter  Frederik Standley, MD 07/21/11 1602 

## 2011-07-21 NOTE — H&P (Signed)
PCP:   Dorcas Carrow, MD, MD   Chief Complaint:  Generalized weakness, dizziness, syncope and collapse.  HPI: 78 year old male with a past medical history significant for coronary artery disease, hypertension, hyperlipidemia, congestive heart failure he, and recent admission secondary to hyponatremia. Who came to the hospital complaining of generalized weakness and episode of syncope and collapse air in the morning. Patient was accompanied by his son who helps with a history; he reports that the patient had been complaining of generalized weakness since he was discharged on 07/07/2010. He had experienced multiple episodes of getting weak/DC space only after changing position (standing), was seen by his PCP 1 day prior to admission when he was found to be orthostatic and was told to stop diuretics. They endorses that just this week patient was also started on zaroxolyn.  So reports that the patient experienced syncope/collapse this morning while he was having breakfast; patient was sitting at the table developed nausea and then passed out.   There was no headache, fever, chills, any focal weakness, numbness, seizure activity, chest pain or shortness of breath associated with or before this event.  In the ED patient was found dehydrated, with positive orthostatic changes; acute renal failure, hypokalemia and hyponatremia. Chest x-ray do not demonstrate infiltrates, there were no elevation of his WBC's and urine did not show signs of infection.   Allergies:  No Known Allergies    Past Medical History  Diagnosis Date  . CAD (coronary artery disease)   . Hypertension   . Hyperlipidemia   . GERD (gastroesophageal reflux disease)   . COPD (chronic obstructive pulmonary disease)   . CRF (chronic renal failure)   . Barrett's esophagus   . OP (osteoporosis)   . CAD (coronary artery disease) of artery bypass graft 07/04/2011    LIMA-LAD; SVG-OM2-OM3, SVG-RCA -- all patent as of 05/27/11  .  Syncope 07/04/2011  . Pacemaker 07/04/2011  . Cardiomyopathy, ischemic 07/04/2011    Past Surgical History  Procedure Date  . Pacemaker placement 2007  . Cholecystectomy 2005  . Coronary artery bypass graft 1978    Prior to Admission medications   Medication Sig Start Date End Date Taking? Authorizing Provider  albuterol-ipratropium (COMBIVENT) 18-103 MCG/ACT inhaler Inhale 2 puffs into the lungs 2 (two) times daily.    Yes Historical Provider, MD  aspirin 325 MG tablet Take 325 mg by mouth daily.     Yes Historical Provider, MD  atorvastatin (LIPITOR) 20 MG tablet Take 20 mg by mouth daily.     Yes Historical Provider, MD  fluticasone (FLONASE) 50 MCG/ACT nasal spray 2 sprays in each nostril twice daily 03/09/11  Yes Historical Provider, MD  Fluticasone-Salmeterol (ADVAIR DISKUS) 500-50 MCG/DOSE AEPB Inhale 1 puff into the lungs every 12 (twelve) hours.     Yes Historical Provider, MD  furosemide (LASIX) 40 MG tablet Take 40 mg by mouth 2 (two) times daily.   Yes Historical Provider, MD  gabapentin (NEURONTIN) 300 MG capsule Take 300 mg by mouth 3 (three) times daily.  02/07/11  Yes Historical Provider, MD  guaiFENesin (MUCINEX) 600 MG 12 hr tablet Take 600 mg by mouth daily.    Yes Historical Provider, MD  loratadine (CLARITIN) 10 MG tablet Take 10 mg by mouth daily.   Yes Historical Provider, MD  losartan (COZAAR) 50 MG tablet Take 50 mg by mouth daily.     Yes Historical Provider, MD  Magnesium Chloride (SLOW-MAG) 535 (64 MG) MG TBCR Take 1 tablet by mouth 2 (two)  times daily.     Yes Historical Provider, MD  metoprolol tartrate (LOPRESSOR) 25 MG tablet Take 25 mg by mouth 2 (two) times daily.   Yes Historical Provider, MD  Omega-3 Fatty Acids (FISH OIL) 1000 MG CAPS Take 1 capsule by mouth daily.    Yes Historical Provider, MD  potassium chloride (K-DUR) 10 MEQ tablet Take 20 mEq by mouth 2 (two) times daily.   Yes Historical Provider, MD  predniSONE (DELTASONE) 5 MG tablet Take 5 mg by  mouth daily.     Yes Historical Provider, MD  ranitidine (ZANTAC) 150 MG capsule Take 150 mg by mouth 2 (two) times daily.    Yes Historical Provider, MD  RAPAFLO 8 MG CAPS capsule Take 8 mg by mouth daily with breakfast. Once daily 03/09/11  Yes Historical Provider, MD  tiotropium (SPIRIVA) 18 MCG inhalation capsule Place 18 mcg into inhaler and inhale daily.    Yes Historical Provider, MD  Vitamin D, Ergocalciferol, (DRISDOL) 50000 UNITS CAPS Take 50,000 Units by mouth every 7 (seven) days. Take on Fridays   Yes Historical Provider, MD  vitamin E 1000 UNIT capsule Take 1,000 Units by mouth daily.     Yes Historical Provider, MD    Social History:  reports that he quit smoking about 25 years ago. His smoking use included Cigarettes. He has a 4.5 pack-year smoking history. He does not have any smokeless tobacco history on file. He reports that he does not drink alcohol or use illicit drugs.  Family History  Problem Relation Age of Onset  . Leukemia Father   . Alzheimer's disease Mother   . Lung cancer Son 60  . Heart disease Father   . Arthritis Mother     Review of Systems:  Cardiovascular: Positive for syncope. Negative for palpitations.  Gastrointestinal: Positive for nausea. Negative for vomiting.  Neurological: Positive for dizziness, loss of consciousness and weakness.  Otherwise negative except as mentioned on history of present illness.  Physical Exam: Blood pressure 133/70, pulse 69, temperature 97.8 F (36.6 C), temperature source Oral, resp. rate 20, height 6\' 2"  (1.88 m), weight 104.1 kg (229 lb 8 oz), SpO2 98.00%. General: Alert, awake and oriented x3, no acute distress. Cooperative to examination. No complaining of shortness of breath or chest pain. HENT:  Head: Normocephalic and atraumatic.  Right Ear: External ear normal.  Left Ear: External ear normal.  Nose: Nose normal.  Mouth/Throat: Oropharynx is clear; dry mucous membranes; no exudates or erythema.  Eyes:  Conjunctivae are normal. Pupils are equal, round, and reactive to light. No scleral icterus.  Neck: Normal range of motion. Neck supple.  Cardiovascular: S1 and S2; no rubs; frequent PVC's and extrasystoles appreciated. No Gallops.  Pulmonary/Chest: CTA bilaterally. no wheezes. no rales.  Abdominal: Soft. Bowel sounds are normal. He exhibits no distension. There is no tenderness.  Musculoskeletal: Normal range of motion. He exhibits no edema and no tenderness.  Neurological: He is alert and oriented to person, place, and time. No cranial nerve deficit.    Labs on Admission:  Results for orders placed during the hospital encounter of 07/21/11 (from the past 48 hour(s))  CBC     Status: Abnormal   Collection Time   07/21/11 12:22 PM      Component Value Range Comment   WBC 12.2 (*) 4.0 - 10.5 (K/uL)    RBC 4.27  4.22 - 5.81 (MIL/uL)    Hemoglobin 13.6  13.0 - 17.0 (g/dL)    HCT 16.1 (*)  39.0 - 52.0 (%)    MCV 87.8  78.0 - 100.0 (fL)    MCH 31.9  26.0 - 34.0 (pg)    MCHC 36.3 (*) 30.0 - 36.0 (g/dL)    RDW 62.1  30.8 - 65.7 (%)    Platelets 179  150 - 400 (K/uL)   DIFFERENTIAL     Status: Abnormal   Collection Time   07/21/11 12:22 PM      Component Value Range Comment   Neutrophils Relative 82 (*) 43 - 77 (%)    Neutro Abs 10.0 (*) 1.7 - 7.7 (K/uL)    Lymphocytes Relative 10 (*) 12 - 46 (%)    Lymphs Abs 1.3  0.7 - 4.0 (K/uL)    Monocytes Relative 7  3 - 12 (%)    Monocytes Absolute 0.9  0.1 - 1.0 (K/uL)    Eosinophils Relative 1  0 - 5 (%)    Eosinophils Absolute 0.1  0.0 - 0.7 (K/uL)    Basophils Relative 0  0 - 1 (%)    Basophils Absolute 0.0  0.0 - 0.1 (K/uL)   BASIC METABOLIC PANEL     Status: Abnormal   Collection Time   07/21/11 12:22 PM      Component Value Range Comment   Sodium 126 (*) 135 - 145 (mEq/L)    Potassium 3.1 (*) 3.5 - 5.1 (mEq/L)    Chloride 88 (*) 96 - 112 (mEq/L)    CO2 24  19 - 32 (mEq/L)    Glucose, Bld 96  70 - 99 (mg/dL)    BUN 73 (*) 6 - 23  (mg/dL)    Creatinine, Ser 8.46 (*) 0.50 - 1.35 (mg/dL)    Calcium 8.8  8.4 - 10.5 (mg/dL)    GFR calc non Af Amer 21 (*) >90 (mL/min)    GFR calc Af Amer 24 (*) >90 (mL/min)   POCT I-STAT TROPONIN I     Status: Normal   Collection Time   07/21/11 12:39 PM      Component Value Range Comment   Troponin i, poc 0.01  0.00 - 0.08 (ng/mL)    Comment 3              Radiological Exams on Admission: Dg Chest 2 View  07/21/2011  *RADIOLOGY REPORT*  Clinical Data: Weakness  CHEST - 2 VIEW  Comparison: Chest radiograph 07/05/2011  Findings: Right-sided pacemaker and sternal wires overlie stable enlarged heart silhouette.  There is basilar atelectasis similar to prior.  Right upper lobe atelectasis is also similar.  There is mild interstitial edema pattern which is improved compared to prior.  No focal consolidation.  No pneumothorax.  IMPRESSION:  1.  Cardiomegaly interstitial edema. 2.  Lower lobe atelectasis and right midlung atelectasis unchanged.  Original Report Authenticated By: Genevive Bi, M.D.     Assessment/Plan 1-Syncope and collapse: Most likely secondary to orthostatis, especially in the settings of dehydration, positive orthostatic changes and recent increased in diuretics dosage. Will hold diuretics. Provide fluid resuscitation. Patient will be admitted to telemetry, CT of the head within normal limits without any acute abnormalities. About further workup to rule out other causes will check TSH, vitamin D, B12 level and also carotid Dopplers. Will repeat orthostatic vital signs in a.m. Cardiology consulted to interrogate pacemakers and help adjusting CHF meds.  2-ARF (acute renal failure): Prerenal azotemia most likely, secondary to dehydration and also continue use of his diuretics and ARB's. Will stop nephrotoxic agents, provide IV fluid resuscitation.  Follow basic metabolic panel in the morning to track creatinine trend.   3-Hyponatremia: Secondary to overdiuresis. Will provide IV  fluid resuscitation and follow sodium level. No signs of fluid overload during this admission.  4-Hypertension: Soft on admission and with positive orthostatics changes. Will provide IV fluid resuscitation and at this point hold his antihypertensive drugs.  5-Hypercholesteremia: continue statins.  6-COPD (chronic obstructive pulmonary disease): Stable; patient currently no wheezing or complaining of shortness of breath. Continue current home inhalers.  7-GERD (gastroesophageal reflux disease): protonix BID  8-Pacemaker, St Jude, implanted 2008: to be interrogated by cardiology.   9-Cardiomyopathy, ischemic, EF 30-35% by recent cath: cardiology consulted to help adjusting his medications.   10-Hypokalemia: will check magnesium and replete potassium; most likely secondary to diuresis.  11-Dehydration: will provide IVF resuscitation.  12-deconditioning and weakness:PT has been consulted; will follow recommendations.  13-DVT: lovenox    Time Spent on Admission: 55 minutes  Gehrig Patras [facility] [phone]  07/21/2011, 8:01 PM

## 2011-07-21 NOTE — ED Notes (Signed)
Patient presents with weakness was discharged from [facility] on 07-07-11 for same symptoms.  Patient had near syncopal episode today, inability to walk due to weakness.  Patient has pacemaker.

## 2011-07-21 NOTE — ED Notes (Signed)
Pt had sudden episode of nausea about 0930  pt fell off of chair.  No LOC. Pt and son deny any injury from fall. Pt slightly confused "per family" as normal

## 2011-07-21 NOTE — ED Notes (Signed)
Patient was assisted to ground during near syncopal episode, patient also denies chest pain.

## 2011-07-21 NOTE — ED Provider Notes (Signed)
Medical screening examination/treatment/procedure(s) were conducted as a shared visit with non-physician practitioner(s) and myself.  I personally evaluated the patient during the encounter Orthostatic hypotension.  Increasing creatinine.  Double the last creatinine value.  He had been on a diuretic previously, which was just stopped yesterday. While he is lying down.  He is in no distress.  We will admit him to the hospital for treatment of orthostatic hypotension and renal insufficiency.  Cheri Guppy, MD 07/21/11 [phone]

## 2011-07-21 NOTE — ED Notes (Signed)
Pt to xray

## 2011-07-21 NOTE — ED Provider Notes (Signed)
History     CSN: [id number]  Arrival date & time 07/21/11  1100   First MD Initiated Contact with Patient 07/21/11 1118      Chief Complaint  Patient presents with  . Weakness    (Consider location/radiation/quality/duration/timing/severity/associated sxs/prior treatment) HPI Comments: Patient here with his son who reports that the patient has complained of generalized weakness since his discharge from her on 3/2 for similar symptoms.  States has multiple episodes of standing and "getting weak" - was seen in PCP's office yesterday and noted to be orthostatic and was told to stop diurectic which they were to do today.  Son states that at breakfast this morning, the patient was just sitting at the table when he suddenly developed nausea and then passed out, he was assisted to the ground by his son.  Denies headache, fever, chills, focal weakness, numbness, seizures, vomiting, chest pain or shortness of breath.  Patient is a 78 y.o. male presenting with weakness. The history is provided by the patient and a relative. No language interpreter was used.  Weakness The primary symptoms include syncope, loss of consciousness, dizziness and nausea. Primary symptoms do not include headaches, altered mental status, seizures, visual change, paresthesias, focal weakness, loss of sensation, speech change, memory loss, fever or vomiting. The symptoms began 3 to 5 days ago. The symptoms are worsening. The neurological symptoms are diffuse. The symptoms occurred after standing up.  There was loss of consciousness. The episode was witnessed. The duration of the episode was 30 seconds. Before the onset of the syncopal episode there was dizziness, weakness and nausea. There was no visual change, vertigo or sweating. The episode occurred with change of posture. The syncopal episode did not occur with palpitations, shortness of breath, lower back pain, headaches, speech change, focal sensory loss or focal weakness.  There was no post-event confusion. There was no urinary incontinence with syncope. There is history of cardiac disease and hypertension. There is no history of atrial fibrillation, congestive heart failure, cerebral vascular accident, seizures, diabetes or transient ischemic attack.   Dizziness also occurs with nausea and weakness. Dizziness does not occur with vomiting.  Additional symptoms include weakness. Additional symptoms do not include lower back pain or vertigo. Medical issues do not include cerebral vascular accident.    Past Medical History  Diagnosis Date  . CAD (coronary artery disease)   . Hypertension   . Hyperlipidemia   . GERD (gastroesophageal reflux disease)   . COPD (chronic obstructive pulmonary disease)   . CRF (chronic renal failure)   . Barrett's esophagus   . OP (osteoporosis)   . CAD (coronary artery disease) of artery bypass graft 07/04/2011    LIMA-LAD; SVG-OM2-OM3, SVG-RCA -- all patent as of 05/27/11  . Syncope 07/04/2011  . Pacemaker 07/04/2011  . Cardiomyopathy, ischemic 07/04/2011    Past Surgical History  Procedure Date  . Pacemaker placement 2007  . Cholecystectomy 2005  . Coronary artery bypass graft 1978    Family History  Problem Relation Age of Onset  . Leukemia Father   . Alzheimer's disease Mother   . Lung cancer Son 70  . Heart disease Father   . Arthritis Mother     History  Substance Use Topics  . Smoking status: Former Smoker -- 0.3 packs/day for 15 years    Types: Cigarettes    Quit date: 05/07/1977  . Smokeless tobacco: Not on file  . Alcohol Use: No      Review of Systems  Constitutional: Negative  for fever.  Respiratory: Negative for shortness of breath.   Cardiovascular: Positive for syncope. Negative for palpitations.  Gastrointestinal: Positive for nausea. Negative for vomiting.  Neurological: Positive for dizziness, loss of consciousness and weakness. Negative for vertigo, speech change, focal weakness, seizures,  headaches and paresthesias.  Psychiatric/Behavioral: Negative for memory loss and altered mental status.  All other systems reviewed and are negative.    Allergies  Review of patient's allergies indicates no known allergies.  Home Medications   Current Outpatient Rx  Name Route Sig Dispense Refill  . IPRATROPIUM-ALBUTEROL 18-103 MCG/ACT IN AERO Inhalation Inhale 2 puffs into the lungs 2 (two) times daily.     . ASPIRIN 325 MG PO TABS Oral Take 325 mg by mouth daily.      . ATORVASTATIN CALCIUM 20 MG PO TABS Oral Take 20 mg by mouth daily.      Marland Kitchen FLUTICASONE PROPIONATE 50 MCG/ACT NA SUSP  2 sprays in each nostril twice daily    . FLUTICASONE-SALMETEROL 500-50 MCG/DOSE IN AEPB Inhalation Inhale 1 puff into the lungs every 12 (twelve) hours.      . FUROSEMIDE 40 MG PO TABS Oral Take 40 mg by mouth 2 (two) times daily.    Marland Kitchen GABAPENTIN 300 MG PO CAPS Oral Take 300 mg by mouth 3 (three) times daily.     . GUAIFENESIN ER 600 MG PO TB12 Oral Take 600 mg by mouth daily.     Marland Kitchen LORATADINE 10 MG PO TABS Oral Take 10 mg by mouth daily.    Marland Kitchen LOSARTAN POTASSIUM 50 MG PO TABS Oral Take 50 mg by mouth daily.      Marland Kitchen MAGNESIUM CHLORIDE ER 535 (64 MG) MG PO TBCR Oral Take 1 tablet by mouth 2 (two) times daily.      Marland Kitchen METOPROLOL TARTRATE 25 MG PO TABS Oral Take 25 mg by mouth 2 (two) times daily.    Marland Kitchen FISH OIL 1000 MG PO CAPS Oral Take 1 capsule by mouth daily.     Marland Kitchen POTASSIUM CHLORIDE ER 10 MEQ PO TBCR Oral Take 20 mEq by mouth 2 (two) times daily.    Marland Kitchen PREDNISONE 5 MG PO TABS Oral Take 5 mg by mouth daily.      Marland Kitchen RANITIDINE HCL 150 MG PO CAPS Oral Take 150 mg by mouth 2 (two) times daily.     Marland Kitchen RAPAFLO 8 MG PO CAPS Oral Take 8 mg by mouth daily with breakfast. Once daily    . TIOTROPIUM BROMIDE MONOHYDRATE 18 MCG IN CAPS Inhalation Place 18 mcg into inhaler and inhale daily.     Marland Kitchen VITAMIN D (ERGOCALCIFEROL) 50000 UNITS PO CAPS Oral Take 50,000 Units by mouth every 7 (seven) days. Take on Fridays    .  VITAMIN E 1000 UNITS PO CAPS Oral Take 1,000 Units by mouth daily.        BP 73/39  Pulse 83  Temp(Src) 97.7 F (36.5 C) (Oral)  Resp 18  SpO2 97%  Physical Exam  Nursing note and vitals reviewed. Constitutional: He is oriented to person, place, and time. He appears well-developed and well-nourished. No distress.  HENT:  Head: Normocephalic and atraumatic.  Right Ear: External ear normal.  Left Ear: External ear normal.  Nose: Nose normal.  Mouth/Throat: Oropharynx is clear and moist. No oropharyngeal exudate.  Eyes: Conjunctivae are normal. Pupils are equal, round, and reactive to light. No scleral icterus.  Neck: Normal range of motion. Neck supple.  Cardiovascular: Normal rate and normal  pulses.  An irregular rhythm present. Frequent extrasystoles are present.  Pulmonary/Chest: Effort normal and breath sounds normal. No respiratory distress. He has no wheezes. He has no rales. He exhibits no tenderness.  Abdominal: Soft. Bowel sounds are normal. He exhibits no distension. There is no tenderness.  Musculoskeletal: Normal range of motion. He exhibits no edema and no tenderness.  Lymphadenopathy:    He has no cervical adenopathy.  Neurological: He is alert and oriented to person, place, and time. No cranial nerve deficit.  Skin: Skin is warm and dry. No rash noted. No erythema. There is pallor.  Psychiatric: He has a normal mood and affect. His behavior is normal. Judgment and thought content normal.    ED Course  Procedures (including critical care time)  Labs Reviewed  CBC - Abnormal; Notable for the following:    WBC 12.2 (*)    HCT 37.5 (*)    MCHC 36.3 (*)    All other components within normal limits  DIFFERENTIAL - Abnormal; Notable for the following:    Neutrophils Relative 82 (*)    Neutro Abs 10.0 (*)    Lymphocytes Relative 10 (*)    All other components within normal limits  BASIC METABOLIC PANEL - Abnormal; Notable for the following:    Sodium 126 (*)     Potassium 3.1 (*)    Chloride 88 (*)    BUN 73 (*)    Creatinine, Ser 2.67 (*)    GFR calc non Af Amer 21 (*)    GFR calc Af Amer 24 (*)    All other components within normal limits  POCT I-STAT TROPONIN I   Dg Chest 2 View  07/21/2011  *RADIOLOGY REPORT*  Clinical Data: Weakness  CHEST - 2 VIEW  Comparison: Chest radiograph 07/05/2011  Findings: Right-sided pacemaker and sternal wires overlie stable enlarged heart silhouette.  There is basilar atelectasis similar to prior.  Right upper lobe atelectasis is also similar.  There is mild interstitial edema pattern which is improved compared to prior.  No focal consolidation.  No pneumothorax.  IMPRESSION:  1.  Cardiomegaly interstitial edema. 2.  Lower lobe atelectasis and right midlung atelectasis unchanged.  Original Report Authenticated By: Genevive Bi, M.D.    Date: 07/21/2011  Rate: 101  Rhythm: normal sinus rhythm and premature ventricular contractions (PVC)  QRS Axis: normal  Intervals: normal  ST/T Wave abnormalities: normal  Conduction Disutrbances:nonspecific intraventricular conduction delay  Narrative Interpretation: reviewed by dr. Weldon Inches  Old EKG Reviewed: unchanged    Orthostatic hypotension Dehydration ARF    MDM  Patient with history of recent diuretic use presents with orthostatis with syncope - he is noted to be hyponatremic, hypokalemic and markedly dehydrated.  Plan to admit back to [facility]ad.        Izola Price [place], [place] 07/21/11 1530

## 2011-07-21 NOTE — ED Notes (Signed)
Pt awaiting telemetry bed.

## 2011-07-22 DIAGNOSIS — R55 Syncope and collapse: Secondary | ICD-10-CM

## 2011-07-22 LAB — BASIC METABOLIC PANEL
CO2: 24 mEq/L (ref 19–32)
Chloride: 95 mEq/L — ABNORMAL LOW (ref 96–112)
GFR calc Af Amer: 44 mL/min — ABNORMAL LOW (ref >90)
Potassium: 3.7 mEq/L (ref 3.5–5.1)
Sodium: 129 mEq/L — ABNORMAL LOW (ref 135–145)

## 2011-07-22 LAB — CBC
MCV: 87.2 fL (ref 78.0–100.0)
Platelets: 171 10*3/uL (ref 150–400)
RBC: 4.14 MIL/uL — ABNORMAL LOW (ref 4.22–5.81)
RDW: 14.5 % (ref 11.5–15.5)
WBC: 11.8 10*3/uL — ABNORMAL HIGH (ref 4.0–10.5)

## 2011-07-22 LAB — VITAMIN B12: Vitamin B-12: 220 pg/mL (ref 211–911)

## 2011-07-22 LAB — TSH: TSH: 0.859 u[IU]/mL (ref 0.350–4.500)

## 2011-07-22 MED ORDER — SODIUM CHLORIDE 0.9 % IV SOLN
INTRAVENOUS | Status: AC
  Administered 2011-07-22: 18:00:00 via INTRAVENOUS

## 2011-07-22 NOTE — Progress Notes (Signed)
*  PRELIMINARY RESULTS* Vascular Ultrasound Carotid Duplex (Doppler) has been completed.  Preliminary findings: Bilaterally 40-59% ICA stenosis. Antegrade vertebral flow.  Farrel Demark RDMS 07/22/2011, 10:27 AM

## 2011-07-22 NOTE — Consult Note (Addendum)
[facility]       CONSULTATION NOTE  Bucher,Shahzaib M Male, 78 y.o., [birth year]  Location: [id number]  Reason for Consult: Syncope; possible pacemaker malfunction  Requesting Physician: Vassie Loll, MD  HPI: This is a 78 y.o. male with a past medical history significant for CAD, previous CABG (patent grafts cath 05/2011) and ischemic cardiomyopathy (EF 30-35%) recently hospitalized for hyponatremia in setting of hypervolemia/CHF exacerbation. Now presents with syncope and hypotension associated with evidence of hypovolemia (acute renal failure) and again hyponatremia (albeit not as severe).  I am not clear on this, but apparently after hospital discharge, metolazone was added (by PCP?). Weight today is 219.8 lbs. When admitted for HF on 2/27, "wet weight" was 237 lb; "dry weight" at hospital discharge was 223 lbs.  There was a nursing report of bradycardia on monitor in ED, not supported by a strip or ECG and there is concern re: pacemaker malfunction. He had a full PM check in office on Friday and PM was operating normally. He does have frequent PVCs as well as about 20% V paced beats.   Please refer to full detailed consultation by Dr. Tresa Endo 07/04/2011.  PMHx:  Past Medical History  Diagnosis Date  . CAD (coronary artery disease)   . Hypertension   . Hyperlipidemia   . GERD (gastroesophageal reflux disease)   . COPD (chronic obstructive pulmonary disease)   . CRF (chronic renal failure)   . Barrett's esophagus   . OP (osteoporosis)   . CAD (coronary artery disease) of artery bypass graft 07/04/2011    LIMA-LAD; SVG-OM2-OM3, SVG-RCA -- all patent as of 05/27/11  . Syncope 07/04/2011  . Pacemaker 07/04/2011  . Cardiomyopathy, ischemic 07/04/2011   Past Surgical History  Procedure Date  . Pacemaker placement 2007  . Cholecystectomy 2005  . Coronary artery bypass graft 1978    FAMHx: Family History  Problem Relation Age of Onset  . Leukemia  Father   . Alzheimer's disease Mother   . Lung cancer Son 49  . Heart disease Father   . Arthritis Mother     SOCHx:  reports that he quit smoking about 25 years ago. His smoking use included Cigarettes. He has a 4.5 pack-year smoking history. He does not have any smokeless tobacco history on file. He reports that he does not drink alcohol or use illicit drugs.  ALLERGIES: No Known Allergies  ROS: Constitutional: negative for chills, fevers and night sweats Eyes: negative for visual disturbance Respiratory: negative for cough, dyspnea on exertion, hemoptysis, sputum and wheezing Cardiovascular: positive for syncope, negative for chest pain, claudication, irregular heart beat, lower extremity edema, orthopnea, palpitations and paroxysmal nocturnal dyspnea Gastrointestinal: negative Musculoskeletal:negative Neurological: negative for coordination problems, gait problems, headaches, speech problems and tremors Behavioral/Psych: negative for depression and excessive alcohol consumption  HOME MEDICATIONS: Prescriptions prior to admission  Medication Sig Dispense Refill  . albuterol-ipratropium (COMBIVENT) 18-103 MCG/ACT inhaler Inhale 2 puffs into the lungs 2 (two) times daily.       Marland Kitchen aspirin 325 MG tablet Take 325 mg by mouth daily.        Marland Kitchen atorvastatin (LIPITOR) 20 MG tablet Take 20 mg by mouth daily.        . fluticasone (FLONASE) 50 MCG/ACT nasal spray 2 sprays in each nostril twice daily      . Fluticasone-Salmeterol (ADVAIR DISKUS) 500-50 MCG/DOSE AEPB Inhale 1 puff into the lungs every 12 (twelve) hours.        Marland Kitchen  furosemide (LASIX) 40 MG tablet Take 40 mg by mouth 2 (two) times daily.      Marland Kitchen gabapentin (NEURONTIN) 300 MG capsule Take 300 mg by mouth 3 (three) times daily.       Marland Kitchen guaiFENesin (MUCINEX) 600 MG 12 hr tablet Take 600 mg by mouth daily.       Marland Kitchen loratadine (CLARITIN) 10 MG tablet Take 10 mg by mouth daily.      Marland Kitchen losartan (COZAAR) 50 MG tablet Take 50 mg by mouth  daily.        . Magnesium Chloride (SLOW-MAG) 535 (64 MG) MG TBCR Take 1 tablet by mouth 2 (two) times daily.        . metoprolol tartrate (LOPRESSOR) 25 MG tablet Take 25 mg by mouth 2 (two) times daily.      . Omega-3 Fatty Acids (FISH OIL) 1000 MG CAPS Take 1 capsule by mouth daily.       . potassium chloride (K-DUR) 10 MEQ tablet Take 20 mEq by mouth 2 (two) times daily.      . predniSONE (DELTASONE) 5 MG tablet Take 5 mg by mouth daily.        . ranitidine (ZANTAC) 150 MG capsule Take 150 mg by mouth 2 (two) times daily.       Marland Kitchen RAPAFLO 8 MG CAPS capsule Take 8 mg by mouth daily with breakfast. Once daily      . tiotropium (SPIRIVA) 18 MCG inhalation capsule Place 18 mcg into inhaler and inhale daily.       . Vitamin D, Ergocalciferol, (DRISDOL) 50000 UNITS CAPS Take 50,000 Units by mouth every 7 (seven) days. Take on Fridays      . vitamin E 1000 UNIT capsule Take 1,000 Units by mouth daily.          HOSPITAL MEDICATIONS: Prior to Admission:  Prescriptions prior to admission  Medication Sig Dispense Refill  . albuterol-ipratropium (COMBIVENT) 18-103 MCG/ACT inhaler Inhale 2 puffs into the lungs 2 (two) times daily.       Marland Kitchen aspirin 325 MG tablet Take 325 mg by mouth daily.        Marland Kitchen atorvastatin (LIPITOR) 20 MG tablet Take 20 mg by mouth daily.        . fluticasone (FLONASE) 50 MCG/ACT nasal spray 2 sprays in each nostril twice daily      . Fluticasone-Salmeterol (ADVAIR DISKUS) 500-50 MCG/DOSE AEPB Inhale 1 puff into the lungs every 12 (twelve) hours.        . furosemide (LASIX) 40 MG tablet Take 40 mg by mouth 2 (two) times daily.      Marland Kitchen gabapentin (NEURONTIN) 300 MG capsule Take 300 mg by mouth 3 (three) times daily.       Marland Kitchen guaiFENesin (MUCINEX) 600 MG 12 hr tablet Take 600 mg by mouth daily.       Marland Kitchen loratadine (CLARITIN) 10 MG tablet Take 10 mg by mouth daily.      Marland Kitchen losartan (COZAAR) 50 MG tablet Take 50 mg by mouth daily.        . Magnesium Chloride (SLOW-MAG) 535 (64 MG) MG  TBCR Take 1 tablet by mouth 2 (two) times daily.        . metoprolol tartrate (LOPRESSOR) 25 MG tablet Take 25 mg by mouth 2 (two) times daily.      . Omega-3 Fatty Acids (FISH OIL) 1000 MG CAPS Take 1 capsule by mouth daily.       . potassium chloride (K-DUR)  10 MEQ tablet Take 20 mEq by mouth 2 (two) times daily.      . predniSONE (DELTASONE) 5 MG tablet Take 5 mg by mouth daily.        . ranitidine (ZANTAC) 150 MG capsule Take 150 mg by mouth 2 (two) times daily.       Marland Kitchen RAPAFLO 8 MG CAPS capsule Take 8 mg by mouth daily with breakfast. Once daily      . tiotropium (SPIRIVA) 18 MCG inhalation capsule Place 18 mcg into inhaler and inhale daily.       . Vitamin D, Ergocalciferol, (DRISDOL) 50000 UNITS CAPS Take 50,000 Units by mouth every 7 (seven) days. Take on Fridays      . vitamin E 1000 UNIT capsule Take 1,000 Units by mouth daily.         Scheduled:   . albuterol-ipratropium  2 puff Inhalation BID  . aspirin  325 mg Oral Daily  . enoxaparin  30 mg Subcutaneous Q24H  . fluticasone  1 spray Each Nare Daily  . Fluticasone-Salmeterol  1 puff Inhalation Q12H  . gabapentin  300 mg Oral TID  . loratadine  10 mg Oral Daily  . magnesium chloride  1 tablet Oral BID  . omega-3 acid ethyl esters  1 g Oral Daily  . pantoprazole  40 mg Oral BID AC  . potassium chloride  20 mEq Oral BID  . potassium chloride  10 mEq Intravenous Once  . potassium chloride  40 mEq Oral Q4H  . predniSONE  5 mg Oral Daily  . silodosin  8 mg Oral Q breakfast  . simvastatin  40 mg Oral Daily  . sodium chloride  1,000 mL Intravenous Once  . sodium chloride  3 mL Intravenous Q12H  . tiotropium  18 mcg Inhalation Daily  . Vitamin D (Ergocalciferol)  50,000 Units Oral Q Fri  . vitamin E  1,000 Units Oral Daily  . DISCONTD: sodium chloride   Intravenous STAT  . DISCONTD: Magnesium Chloride  1 tablet Oral BID    VITALS: Blood pressure 111/67, pulse 82, temperature 97.6 F (36.4 C), temperature source Oral, resp.  rate 18, height 6\' 2"  (1.88 m), weight 104.1 kg (229 lb 8 oz), SpO2 96.00%.  PHYSICAL EXAM: General appearance: alert, cooperative and no distress Neck: no adenopathy, no carotid bruit, no JVD, supple, symmetrical, trachea midline and thyroid not enlarged, symmetric, no tenderness/mass/nodules Lungs: clear to auscultation bilaterally Heart: regular rate and rhythm, S1, S2 normal, no murmur, click, rub or gallop and frequent ectopy Abdomen: soft, non-tender; bowel sounds normal; no masses,  no organomegaly Extremities: extremities normal, atraumatic, no cyanosis or edema Pulses: 2+ and symmetric Skin: Skin color, texture, turgor normal. No rashes or lesions Neurologic: Alert and oriented X 3, normal strength and tone. Normal symmetric reflexes. Normal coordination and gait  LABS: Results for orders placed during the hospital encounter of 07/21/11 (from the past 48 hour(s))  CBC     Status: Abnormal   Collection Time   07/21/11 12:22 PM      Component Value Range Comment   WBC 12.2 (*) 4.0 - 10.5 (K/uL)    RBC 4.27  4.22 - 5.81 (MIL/uL)    Hemoglobin 13.6  13.0 - 17.0 (g/dL)    HCT 16.1 (*) 09.6 - 52.0 (%)    MCV 87.8  78.0 - 100.0 (fL)    MCH 31.9  26.0 - 34.0 (pg)    MCHC 36.3 (*) 30.0 - 36.0 (g/dL)  RDW 14.6  11.5 - 15.5 (%)    Platelets 179  150 - 400 (K/uL)   DIFFERENTIAL     Status: Abnormal   Collection Time   07/21/11 12:22 PM      Component Value Range Comment   Neutrophils Relative 82 (*) 43 - 77 (%)    Neutro Abs 10.0 (*) 1.7 - 7.7 (K/uL)    Lymphocytes Relative 10 (*) 12 - 46 (%)    Lymphs Abs 1.3  0.7 - 4.0 (K/uL)    Monocytes Relative 7  3 - 12 (%)    Monocytes Absolute 0.9  0.1 - 1.0 (K/uL)    Eosinophils Relative 1  0 - 5 (%)    Eosinophils Absolute 0.1  0.0 - 0.7 (K/uL)    Basophils Relative 0  0 - 1 (%)    Basophils Absolute 0.0  0.0 - 0.1 (K/uL)   BASIC METABOLIC PANEL     Status: Abnormal   Collection Time   07/21/11 12:22 PM      Component Value Range  Comment   Sodium 126 (*) 135 - 145 (mEq/L)    Potassium 3.1 (*) 3.5 - 5.1 (mEq/L)    Chloride 88 (*) 96 - 112 (mEq/L)    CO2 24  19 - 32 (mEq/L)    Glucose, Bld 96  70 - 99 (mg/dL)    BUN 73 (*) 6 - 23 (mg/dL)    Creatinine, Ser 1.61 (*) 0.50 - 1.35 (mg/dL)    Calcium 8.8  8.4 - 10.5 (mg/dL)    GFR calc non Af Amer 21 (*) >90 (mL/min)    GFR calc Af Amer 24 (*) >90 (mL/min)   POCT I-STAT TROPONIN I     Status: Normal   Collection Time   07/21/11 12:39 PM      Component Value Range Comment   Troponin i, poc 0.01  0.00 - 0.08 (ng/mL)    Comment 3            CBC     Status: Abnormal   Collection Time   07/21/11  8:11 PM      Component Value Range Comment   WBC 13.0 (*) 4.0 - 10.5 (K/uL)    RBC 4.24  4.22 - 5.81 (MIL/uL)    Hemoglobin 13.1  13.0 - 17.0 (g/dL)    HCT 09.6 (*) 04.5 - 52.0 (%)    MCV 87.7  78.0 - 100.0 (fL)    MCH 30.9  26.0 - 34.0 (pg)    MCHC 35.2  30.0 - 36.0 (g/dL)    RDW 40.9  81.1 - 91.4 (%)    Platelets 170  150 - 400 (K/uL)   MAGNESIUM     Status: Normal   Collection Time   07/21/11  8:11 PM      Component Value Range Comment   Magnesium 2.1  1.5 - 2.5 (mg/dL)   PHOSPHORUS     Status: Normal   Collection Time   07/21/11  8:11 PM      Component Value Range Comment   Phosphorus 3.2  2.3 - 4.6 (mg/dL)   TSH     Status: Normal   Collection Time   07/21/11  8:11 PM      Component Value Range Comment   TSH 0.859  0.350 - 4.500 (uIU/mL)   VITAMIN B12     Status: Normal   Collection Time   07/21/11  8:11 PM      Component Value Range Comment  Vitamin B-12 220  211 - 911 (pg/mL)   MRSA PCR SCREENING     Status: Normal   Collection Time   07/21/11  9:38 PM      Component Value Range Comment   MRSA by PCR NEGATIVE  NEGATIVE    CBC     Status: Abnormal   Collection Time   07/22/11  6:17 AM      Component Value Range Comment   WBC 11.8 (*) 4.0 - 10.5 (K/uL)    RBC 4.14 (*) 4.22 - 5.81 (MIL/uL)    Hemoglobin 12.8 (*) 13.0 - 17.0 (g/dL)    HCT 51.8 (*) 84.1 -  52.0 (%)    MCV 87.2  78.0 - 100.0 (fL)    MCH 30.9  26.0 - 34.0 (pg)    MCHC 35.5  30.0 - 36.0 (g/dL)    RDW 66.0  63.0 - 16.0 (%)    Platelets 171  150 - 400 (K/uL)   BASIC METABOLIC PANEL     Status: Abnormal   Collection Time   07/22/11  6:17 AM      Component Value Range Comment   Sodium 129 (*) 135 - 145 (mEq/L)    Potassium 3.7  3.5 - 5.1 (mEq/L)    Chloride 95 (*) 96 - 112 (mEq/L)    CO2 24  19 - 32 (mEq/L)    Glucose, Bld 118 (*) 70 - 99 (mg/dL)    BUN 50 (*) 6 - 23 (mg/dL)    Creatinine, Ser 1.09 (*) 0.50 - 1.35 (mg/dL) DELTA CHECK NOTED   Calcium 8.7  8.4 - 10.5 (mg/dL)    GFR calc non Af Amer 38 (*) >90 (mL/min)    GFR calc Af Amer 44 (*) >90 (mL/min)     IMAGING: Dg Chest 2 View  07/21/2011  *RADIOLOGY REPORT*  Clinical Data: Weakness  CHEST - 2 VIEW  Comparison: Chest radiograph 07/05/2011  Findings: Right-sided pacemaker and sternal wires overlie stable enlarged heart silhouette.  There is basilar atelectasis similar to prior.  Right upper lobe atelectasis is also similar.  There is mild interstitial edema pattern which is improved compared to prior.  No focal consolidation.  No pneumothorax.  IMPRESSION:  1.  Cardiomegaly interstitial edema. 2.  Lower lobe atelectasis and right midlung atelectasis unchanged.  Original Report Authenticated By: Genevive Bi, M.D.    IMPRESSION: 1. Hypotensive syncope related to excessive diuresis 2. Hyponatremia due to same, improving 3. Will interrogate PM for possible tachyarrhythmia, but clinical presentation c/w hypotensive event; there is no indication of PM malfunction and the device was evaluated just 24 hours earlier. 4. Ideal weight for hemodynamic compensation appears to be around 223-227 lbs. I am not sure when/why metolazone was added, but diuretics will need to be adjusted to avoid recurrent problems of this nature. 5. Hold diuretics until weight >223 lbs 6. He meets criteria for primary prevention AICD implantation  (MADIT II) and will address this once metabolic and hemodynamic issues are resolved. His advanced age limits the possible benefit of ICD therapy.  Time Spent Directly with Patient: 45 minutes  Thurmon Fair, MD, [facility] [phone] office [phone] pager   Afia Messenger 07/22/2011, 10:01 AM

## 2011-07-22 NOTE — Progress Notes (Signed)
Physical Therapy Evaluation Patient Details Name: Richard Chavez MRN: [id number] DOB: [birth year] Today's Date: 07/22/2011  Problem List:  Patient Active Problem List  Diagnoses  . Hypertension  . Heart attack  . NSVT reported in ER  . Hypercholesteremia  . Allergic rhinitis  . COPD (chronic obstructive pulmonary disease)  . GERD (gastroesophageal reflux disease)  . Barrett's esophagus  . Hyponatremia, 123 on admission, (same as 06/30/11)  . UTI (lower urinary tract infection)  . CAD CABG 1998, patent grafts 06/06/11 after abn Myoview  . Syncope  . Pacemaker, [employer], implanted 2008  . Cardiomyopathy, ischemic, EF 30-35% by recent cath  . Edema of both legs  . Acute pulmonary edema, documented on CXR  . Hypokalemia  . Dehydration  . ARF (acute renal failure)    Past Medical History:  Past Medical History  Diagnosis Date  . CAD (coronary artery disease)   . Hypertension   . Hyperlipidemia   . GERD (gastroesophageal reflux disease)   . COPD (chronic obstructive pulmonary disease)   . CRF (chronic renal failure)   . Barrett's esophagus   . OP (osteoporosis)   . CAD (coronary artery disease) of artery bypass graft 07/04/2011    LIMA-LAD; SVG-OM2-OM3, SVG-RCA -- all patent as of 05/27/11  . Syncope 07/04/2011  . Pacemaker 07/04/2011  . Cardiomyopathy, ischemic 07/04/2011   Past Surgical History:  Past Surgical History  Procedure Date  . Pacemaker placement 2007  . Cholecystectomy 2005  . Coronary artery bypass graft 1978    PT Assessment/Plan/Recommendation PT Assessment Clinical Impression Statement: Pt admitted with syncope due to dehydration along with the below PT problem list.  Pt would benefit from acute PT to maximize independence and facilitate d/c home with HHPT and 24 hour supervision. PT Recommendation/Assessment: Patient will need skilled PT in the acute care venue PT Problem List: Decreased balance;Decreased mobility;Decreased knowledge of use of DME Barriers  to Discharge: None PT Therapy Diagnosis : Difficulty walking;Generalized weakness PT Plan PT Frequency: Min 3X/week PT Treatment/Interventions: DME instruction;Gait training;Stair training;Functional mobility training;Therapeutic activities;Balance training;Patient/family education;Cognitive remediation PT Recommendation Follow Up Recommendations: Home health PT;Supervision/Assistance - 24 hour (If 24 hour supervision unavailable, pt may need STSNF.) Equipment Recommended: Rolling walker with 5" wheels PT Goals  Acute Rehab PT Goals PT Goal Formulation: With patient Time For Goal Achievement: 7 days Pt will go Sit to Stand: with modified independence PT Goal: Sit to Stand - Progress: Goal set today Pt will go Stand to Sit: with modified independence PT Goal: Stand to Sit - Progress: Goal set today Pt will Ambulate: >150 feet;with supervision;with least restrictive assistive device PT Goal: Ambulate - Progress: Goal set today Pt will Perform Home Exercise Program: with supervision, verbal cues required/provided PT Goal: Perform Home Exercise Program - Progress: Goal set today  PT Evaluation Precautions/Restrictions  Precautions Precautions: Fall Required Braces or Orthoses: No Restrictions Weight Bearing Restrictions: No Prior Functioning  Home Living Lives With: Sheran Spine Help From: Friend(s) Type of Home: House Home Layout: One level Home Access: Stairs to enter Entrance Stairs-Rails: None Entrance Stairs-Number of Steps: 2 Bathroom Shower/Tub: Engineer, manufacturing systems: Standard Home Adaptive Equipment: Grab bars in shower;Grab bars around toilet Prior Function Level of Independence: Independent with basic ADLs;Independent with homemaking with ambulation;Independent with gait;Independent with transfers Able to Take Stairs?: Yes Driving: Yes Vocation: Retired Financial risk analyst Arousal/Alertness: Awake/alert Overall Cognitive Status: Impaired Memory:  Appears impaired Memory Deficits: Pt with off topic conversation throughout session, but easily redirected. Orientation Level: Oriented  to person;Oriented to place;Oriented to situation;Disoriented to time Sensation/Coordination Sensation Light Touch: Appears Intact Stereognosis: Not tested Hot/Cold: Not tested Proprioception: Not tested Coordination Gross Motor Movements are Fluid and Coordinated: Yes Fine Motor Movements are Fluid and Coordinated: Yes Extremity Assessment RUE Assessment RUE Assessment: Within Functional Limits LUE Assessment LUE Assessment: Within Functional Limits RLE Assessment RLE Assessment: Within Functional Limits LLE Assessment LLE Assessment: Within Functional Limits Pain 0/10 with treatment. Mobility (including Balance) Bed Mobility Bed Mobility: Yes Supine to Sit: 6: Modified independent (Device/Increase time);HOB flat Sit to Supine: 6: Modified independent (Device/Increase time);HOB flat Transfers Transfers: Yes Sit to Stand: 4: Min assist;With upper extremity assist;From bed Sit to Stand Details (indicate cue type and reason): Assist for balance with cues for hand placement. Stand to Sit: 4: Min assist;With upper extremity assist;To bed Stand to Sit Details: Assist for balance with cues for hand placement and slow descent. Ambulation/Gait Ambulation/Gait: Yes Ambulation/Gait Assistance: 4: Min assist Ambulation/Gait Assistance Details (indicate cue type and reason): Assist for balance with cues for tall posture and increased step lengths. Ambulation Distance (Feet): 114 Feet Assistive device: 1 person hand held assist Gait Pattern: Decreased step length - right;Decreased step length - left;Shuffle;Trunk flexed Stairs: No Wheelchair Mobility Wheelchair Mobility: No  Posture/Postural Control Posture/Postural Control: No significant limitations Balance Balance Assessed: No End of Session PT - End of Session Equipment Utilized During  Treatment: Gait belt Activity Tolerance: Patient tolerated treatment well Patient left: in bed;with call bell in reach;with bed alarm set Nurse Communication: Mobility status for transfers;Mobility status for ambulation General Behavior During Session: [facility] for tasks performed Cognition: [facility] for tasks performed  Cephus Shelling 07/22/2011, 1:05 PM  07/22/2011 Cephus Shelling, PT, DPT [phone]

## 2011-07-22 NOTE — Progress Notes (Signed)
Subjective: Feeling a lot better. No chest pain. No shortness of breath. Denies any dizziness and at this point feeling also stronger. Appetite improved.  Objective: Vital signs in last 24 hours: Temp:  [97.6 F (36.4 C)-98 F (36.7 C)] 97.6 F (36.4 C) (03/17 0525) Pulse Rate:  [69-85] 82  (03/17 0530) Resp:  [18-20] 18  (03/17 0525) BP: (102-135)/(47-70) 111/67 mmHg (03/17 0530) SpO2:  [95 %-100 %] 96 % (03/17 0932) Weight:  [104.1 kg (229 lb 8 oz)] 104.1 kg (229 lb 8 oz) (03/16 1814) Weight change:  Last BM Date: 07/21/11  Intake/Output from previous day: 03/16 0701 - 03/17 0700 In: 814.3 [I.V.:814.3] Out: 700 [Urine:700] Total I/O In: 120 [P.O.:120] Out: 400 [Urine:400]   Physical Exam: General: Alert, awake, oriented x3, in no acute distress. HEENT: No bruits, no goiter. Heart: Regular rate and rhythm, without murmurs, rubs, gallops. Lungs: Clear to auscultation bilaterally. Abdomen: Soft, nontender, nondistended, positive bowel sounds. Extremities: No clubbing cyanosis or edema with positive pedal pulses. Neuro: Grossly intact, nonfocal.   Lab Results: Basic Metabolic Panel:  Basename 07/22/11 0617 07/21/11 2011 07/21/11 1222  NA 129* -- 126*  K 3.7 -- 3.1*  CL 95* -- 88*  CO2 24 -- 24  GLUCOSE 118* -- 96  BUN 50* -- 73*  CREATININE 1.63* -- 2.67*  CALCIUM 8.7 -- 8.8  MG -- 2.1 --  PHOS -- 3.2 --   CBC:  Basename 07/22/11 0617 07/21/11 2011 07/21/11 1222  WBC 11.8* 13.0* --  NEUTROABS -- -- 10.0*  HGB 12.8* 13.1 --  HCT 36.1* 37.2* --  MCV 87.2 87.7 --  PLT 171 170 --   Thyroid Function Tests:  Basename 07/21/11 2011  TSH 0.859  T4TOTAL --  FREET4 --  T3FREE --  THYROIDAB --   Anemia Panel:  Basename 07/21/11 2011  VITAMINB12 220  FOLATE --  FERRITIN --  TIBC --  IRON --  RETICCTPCT --   Misc. Labs:  Recent Results (from the past 240 hour(s))  MRSA PCR SCREENING     Status: Normal   Collection Time   07/21/11  9:38 PM   Component Value Range Status Comment   MRSA by PCR NEGATIVE  NEGATIVE  Final     Studies/Results: Dg Chest 2 View  07/21/2011  *RADIOLOGY REPORT*  Clinical Data: Weakness  CHEST - 2 VIEW  Comparison: Chest radiograph 07/05/2011  Findings: Right-sided pacemaker and sternal wires overlie stable enlarged heart silhouette.  There is basilar atelectasis similar to prior.  Right upper lobe atelectasis is also similar.  There is mild interstitial edema pattern which is improved compared to prior.  No focal consolidation.  No pneumothorax.  IMPRESSION:  1.  Cardiomegaly interstitial edema. 2.  Lower lobe atelectasis and right midlung atelectasis unchanged.  Original Report Authenticated By: Genevive Bi, M.D.    Medications: Scheduled Meds:   . albuterol-ipratropium  2 puff Inhalation BID  . aspirin  325 mg Oral Daily  . enoxaparin  30 mg Subcutaneous Q24H  . fluticasone  1 spray Each Nare Daily  . Fluticasone-Salmeterol  1 puff Inhalation Q12H  . gabapentin  300 mg Oral TID  . loratadine  10 mg Oral Daily  . magnesium chloride  1 tablet Oral BID  . omega-3 acid ethyl esters  1 g Oral Daily  . pantoprazole  40 mg Oral BID AC  . potassium chloride  20 mEq Oral BID  . potassium chloride  40 mEq Oral Q4H  . predniSONE  5  mg Oral Daily  . silodosin  8 mg Oral Q breakfast  . simvastatin  40 mg Oral Daily  . sodium chloride  3 mL Intravenous Q12H  . tiotropium  18 mcg Inhalation Daily  . Vitamin D (Ergocalciferol)  50,000 Units Oral Q Fri  . vitamin E  1,000 Units Oral Daily  . DISCONTD: sodium chloride   Intravenous STAT  . DISCONTD: Magnesium Chloride  1 tablet Oral BID   Continuous Infusions:   . sodium chloride 75 mL/hr at 07/22/11 0700   PRN Meds:.acetaminophen, acetaminophen, morphine, ondansetron (ZOFRAN) IV, ondansetron, DISCONTD: ondansetron (ZOFRAN) IV  Assessment/Plan: 1-Syncope: No chest pain, no shortness breath. Patient at this moment not complaining of any dizziness and  feeling better. Patient carotid dopplers with 40-50% stenosis bilaterally and antegrade flow. Syncope most likely due to hypotension and orthostasis. Will continue IVF for now. Restart low dose lasix in am if appropriate.  2-Hypertension: BP still soft; will continue holding antihypertensive agents for now.  3-Hypercholesteremia: continue statins  4-COPD (chronic obstructive pulmonary disease): stable. Continue current  inhaler therapy.  5-GERD (gastroesophageal reflux disease): continue PPI.  6-Hyponatremia: 129 today; will follow sodium trend in am. Continue IVF's and cont holding diuretics.  7-Pacemaker, St Jude, implanted 2008: to be interrogated by cardiology tomorrow most likely.  8-Cardiomyopathy, ischemic, EF 30-35% by recent cath: once dehydration episode is resolved; will restart adjusted regimen for his condition as recommended by cardiology.  9-Hypokalemia: repleted.  10-ARF (acute renal failure): Cr 1.63 today; continue IVF.  11-DVT: continue lovenox.    LOS: 1 day   Ilissa Rosner [facility] [phone]  07/22/2011, 5:21 PM

## 2011-07-23 LAB — BASIC METABOLIC PANEL
BUN: 26 mg/dL — ABNORMAL HIGH (ref 6–23)
Calcium: 8.6 mg/dL (ref 8.4–10.5)
Chloride: 98 mEq/L (ref 96–112)
Creatinine, Ser: 1.14 mg/dL (ref 0.50–1.35)
GFR calc Af Amer: 67 mL/min — ABNORMAL LOW (ref >90)
GFR calc non Af Amer: 58 mL/min — ABNORMAL LOW (ref >90)

## 2011-07-23 MED ORDER — FUROSEMIDE 20 MG PO TABS
20.0000 mg | ORAL_TABLET | Freq: Every day | ORAL | Status: DC
  Filled 2011-07-23 (×2): qty 1

## 2011-07-23 MED ORDER — FUROSEMIDE 40 MG PO TABS
40.0000 mg | ORAL_TABLET | Freq: Every day | ORAL | Status: DC
  Administered 2011-07-23: 40 mg via ORAL
  Filled 2011-07-23: qty 1

## 2011-07-23 MED ORDER — METOPROLOL TARTRATE 25 MG PO TABS
25.0000 mg | ORAL_TABLET | Freq: Two times a day (BID) | ORAL | Status: DC
  Administered 2011-07-23 – 2011-07-24 (×2): 25 mg via ORAL
  Filled 2011-07-23 (×4): qty 1

## 2011-07-23 NOTE — Progress Notes (Signed)
Utilization Review Completed.  Sicilia Killough T  07/23/2011  

## 2011-07-23 NOTE — Progress Notes (Deleted)
Pt reports headache and chest pain 4/10 non radiating.  BP 127/84 with HR 86, pt on cpap.  EKG obtained with no acute EKG changes.  1 sl ntg with relief for cp and 650 mg tylenol for headache - will continue to monitor.

## 2011-07-23 NOTE — Progress Notes (Signed)
Subjective: Feeling Well; not acute complaints. Denies CP or SOB.  Objective: Vital signs in last 24 hours: Temp:  [98.2 F (36.8 C)-98.3 F (36.8 C)] 98.3 F (36.8 C) (03/18 0600) Pulse Rate:  [75-81] 81  (03/18 1100) Resp:  [14-16] 16  (03/18 0600) BP: (123-143)/(66-70) 123/67 mmHg (03/18 1100) SpO2:  [95 %-100 %] 98 % (03/18 0756) Weight change:  Last BM Date: 07/20/11  Intake/Output from previous day: 03/17 0701 - 03/18 0700 In: 120 [P.O.:120] Out: 775 [Urine:775] Total I/O In: 240 [P.O.:240] Out: -    Physical Exam: General: Alert, awake, oriented x3, in no acute distress. HEENT: No bruits, no goiter. Heart: Regular rate and rhythm, without murmurs, rubs, gallops. Lungs: Clear to auscultation bilaterally. Abdomen: Soft, nontender, nondistended, positive bowel sounds. Extremities: No clubbing cyanosis or edema with positive pedal pulses. Neuro: Grossly intact, nonfocal.   Lab Results: Basic Metabolic Panel:  Basename 07/23/11 0540 07/22/11 0617 07/21/11 2011  NA 132* 129* --  K 3.9 3.7 --  CL 98 95* --  CO2 23 24 --  GLUCOSE 107* 118* --  BUN 26* 50* --  CREATININE 1.14 1.63* --  CALCIUM 8.6 8.7 --  MG -- -- 2.1  PHOS -- -- 3.2   CBC:  Basename 07/22/11 0617 07/21/11 2011 07/21/11 1222  WBC 11.8* 13.0* --  NEUTROABS -- -- 10.0*  HGB 12.8* 13.1 --  HCT 36.1* 37.2* --  MCV 87.2 87.7 --  PLT 171 170 --   Thyroid Function Tests:  Basename 07/21/11 2011  TSH 0.859  T4TOTAL --  FREET4 --  T3FREE --  THYROIDAB --   Anemia Panel:  Basename 07/21/11 2011  VITAMINB12 220  FOLATE --  FERRITIN --  TIBC --  IRON --  RETICCTPCT --   Misc. Labs:  Recent Results (from the past 240 hour(s))  MRSA PCR SCREENING     Status: Normal   Collection Time   07/21/11  9:38 PM      Component Value Range Status Comment   MRSA by PCR NEGATIVE  NEGATIVE  Final     Studies/Results: Dg Chest 2 View  07/21/2011  *RADIOLOGY REPORT*  Clinical Data: Weakness   CHEST - 2 VIEW  Comparison: Chest radiograph 07/05/2011  Findings: Right-sided pacemaker and sternal wires overlie stable enlarged heart silhouette.  There is basilar atelectasis similar to prior.  Right upper lobe atelectasis is also similar.  There is mild interstitial edema pattern which is improved compared to prior.  No focal consolidation.  No pneumothorax.  IMPRESSION:  1.  Cardiomegaly interstitial edema. 2.  Lower lobe atelectasis and right midlung atelectasis unchanged.  Original Report Authenticated By: Genevive Bi, M.D.    Medications: Scheduled Meds:    . albuterol-ipratropium  2 puff Inhalation BID  . aspirin  325 mg Oral Daily  . enoxaparin  30 mg Subcutaneous Q24H  . fluticasone  1 spray Each Nare Daily  . Fluticasone-Salmeterol  1 puff Inhalation Q12H  . furosemide  40 mg Oral Daily  . gabapentin  300 mg Oral TID  . loratadine  10 mg Oral Daily  . magnesium chloride  1 tablet Oral BID  . omega-3 acid ethyl esters  1 g Oral Daily  . pantoprazole  40 mg Oral BID AC  . potassium chloride  20 mEq Oral BID  . predniSONE  5 mg Oral Daily  . silodosin  8 mg Oral Q breakfast  . simvastatin  40 mg Oral Daily  . sodium chloride  3  mL Intravenous Q12H  . tiotropium  18 mcg Inhalation Daily  . Vitamin D (Ergocalciferol)  50,000 Units Oral Q Fri  . vitamin E  1,000 Units Oral Daily   Continuous Infusions:    . sodium chloride 75 mL/hr at 07/22/11 1759  . DISCONTD: sodium chloride 75 mL/hr at 07/22/11 0700   PRN Meds:.acetaminophen, acetaminophen, morphine, ondansetron (ZOFRAN) IV, ondansetron  Assessment/Plan: 1-Syncope: No chest pain, no shortness breath. Patient at this moment not complaining of any dizziness and feeling better. Syncope most likely due to hypotension and orthostasis. Will restart low dose lasix today; follow BMET in am and also orthostatic VS. Most likely home tomorrow.  2-Hypertension: BP stable; lasix will be restarted today. Continue holding the  rest of his antihypertensive drugs.  3-Hypercholesteremia: continue statins  4-COPD (chronic obstructive pulmonary disease): stable. Continue current  inhaler therapy.  5-GERD (gastroesophageal reflux disease): continue PPI.  6-Hyponatremia: 132 today; will follow sodium trend in am. Especially now that lasix is restarted.  7-Pacemaker, [employer], implanted 2008: working properly.  8-Cardiomyopathy, ischemic, EF 30-35% by recent cath: restart lasix 40mg  daily; will follow electrolytes and if BP stable tomorrow will restart low dose ARB.  9-Hypokalemia: repleted.  10-ARF (acute renal failure): Back to normal. Cr 1.14  11-DVT: continue lovenox.    LOS: 2 days   Rowyn Mustapha [facility] [phone]  07/23/2011, 11:46 AM

## 2011-07-23 NOTE — Progress Notes (Addendum)
Subjective:  Up in chair. Feeling well.  Objective:  Vital Signs in the last 24 hours: Temp:  [98.2 F (36.8 C)-98.3 F (36.8 C)] 98.3 F (36.8 C) (03/18 0600) Pulse Rate:  [75] 75  (03/18 0600) Resp:  [14-16] 16  (03/18 0600) BP: (132-143)/(66-70) 143/70 mmHg (03/18 0600) SpO2:  [95 %-100 %] 98 % (03/18 0756)  Intake/Output from previous day:  Intake/Output Summary (Last 24 hours) at 07/23/11 0913 Last data filed at 07/23/11 0500  Gross per 24 hour  Intake      0 ml  Output    775 ml  Net   -775 ml    Physical Exam: General appearance: alert, cooperative and no distress Lungs: clear to auscultation bilaterally Heart: regular rate and rhythm Extrem: trace edema No M/R/G   Rate: 90  Rhythm: undetermined. I think its NSR but I only see intermitent atrial pacing waves. He did have 4 bt NSWCT  Lab Results:  Basename 07/22/11 0617 07/21/11 2011  WBC 11.8* 13.0*  HGB 12.8* 13.1  PLT 171 170    Basename 07/23/11 0540 07/22/11 0617  NA 132* 129*  K 3.9 3.7  CL 98 95*  CO2 23 24  GLUCOSE 107* 118*  BUN 26* 50*  CREATININE 1.14 1.63*   Imaging: Dg Chest 2 View  07/21/2011  *RADIOLOGY REPORT*  Clinical Data: Weakness  CHEST - 2 VIEW  Comparison: Chest radiograph 07/05/2011  Findings: Right-sided pacemaker and sternal wires overlie stable enlarged heart silhouette.  There is basilar atelectasis similar to prior.  Right upper lobe atelectasis is also similar.  There is mild interstitial edema pattern which is improved compared to prior.  No focal consolidation.  No pneumothorax.  IMPRESSION:  1.  Cardiomegaly interstitial edema. 2.  Lower lobe atelectasis and right midlung atelectasis unchanged.  Original Report Authenticated By: Genevive Bi, M.D.    Cardiac Studies:  Assessment/Plan:   Principal Problem:  *Syncope  Active Problems:  Cardiomyopathy, ischemic, EF 30-35% by recent cath  Hypokalemia, resolved Hyponatremia, resolved  Dehydration, resolved  ARF  (acute renal failure), resolved  Hypertension  NSVT   Hypercholesteremia  COPD (chronic obstructive pulmonary disease)  GERD (gastroesophageal reflux disease)  CAD CABG 1998, patent grafts 06/06/11 after abn Myoview  Pacemaker, St Jude, implanted 2008  Plan- I had seen Mr Klippel 07/10/11 and placed him on Zaroxolyn twice a week ( I think he got 2 doses) for LE edema and cough. When I saw him in follow up 3/15 he was doing well. His B/P was low but he was asymptomatic. He had a BMP at his primary care's office the day before but they closed at noon Friday and these results were unavailable to me. Friday I held Mr Loudermilk ARB and Lasix, stopped his Zaroxolyn, he was to resume his Lasix at a decreased dose Sunday. Will discuss discharge meds with MD.   Corine Shelter PA-C 07/23/2011, 9:13 AM  I have seen and examined the patient along with Sherre Poot.  I have reviewed the chart, notes and new data.  I agree with Luke's note.  Brief Description: 78 y/o man well known to me - recently admitted for mild volume overload with HypoNatremia in setting of UTI.  Seen in f/u with adjustment of diuretic --> now admitted for dehydration /syncope.  Feeling much better.  CAD s/p CABG - cath in 06/2011 with patent grafts. Ambulated today in hall without difficulty. PPM interrogated - no VT or arrythmia to explain syncope.  Key new complaints:  Feels fine, no swelling. Key examination changes: trace edema, lungs clear Key new findings / data: Na up to 132, CR improved to 1.14  PLAN:  Will resume standing home Lasix dose of 40mg  daily => with weight adjusted diuretic dosage.    Check Daily wgts (target "dry wgt" is 225lb +/- 2lb, no wgt recorded today)  Educated pt re plan:  Dry Wgt on discharge --> Daily AM wgts (target is Dry Wgt +/- 2 Lb)  If > 3lb up from dry wgt, take additional 1/2 dose in PM (if >5lb take additional full dose in PM);   If >3lb down from dry wgt, hold standing dose.  Restart BB dose  today as BP stable.  Would like to restart ARB (at least @ 1/2 dose prior to d/c)  HypoNatremia - improving  Monitor for at least 1 day on PO Lasix 20mg  & home BB prior to d/c.   Will re-address ? Re AICD as OP.   Marykay Lex, M.D., M.S. [facility] [address] [place], [place]  [address]  [phone]  07/23/2011 2:06 PM

## 2011-07-24 LAB — BASIC METABOLIC PANEL
BUN: 19 mg/dL (ref 6–23)
CO2: 24 mEq/L (ref 19–32)
Calcium: 8.9 mg/dL (ref 8.4–10.5)
Chloride: 99 mEq/L (ref 96–112)
Creatinine, Ser: 1.04 mg/dL (ref 0.50–1.35)
GFR calc Af Amer: 75 mL/min — ABNORMAL LOW (ref >90)
GFR calc non Af Amer: 65 mL/min — ABNORMAL LOW (ref >90)
Glucose, Bld: 92 mg/dL (ref 70–99)
Potassium: 3.8 mEq/L (ref 3.5–5.1)
Sodium: 132 mEq/L — ABNORMAL LOW (ref 135–145)

## 2011-07-24 MED ORDER — FUROSEMIDE 40 MG PO TABS
40.0000 mg | ORAL_TABLET | Freq: Every day | ORAL | Status: DC
  Administered 2011-07-24: 40 mg via ORAL
  Filled 2011-07-24: qty 1

## 2011-07-24 MED ORDER — METOPROLOL TARTRATE 25 MG PO TABS: 12.5000 mg | ORAL_TABLET | Freq: Two times a day (BID) | ORAL | Status: DC

## 2011-07-24 MED ORDER — LOSARTAN POTASSIUM 50 MG PO TABS: 25.0000 mg | ORAL_TABLET | Freq: Every day | ORAL | Status: DC

## 2011-07-24 MED ORDER — LOSARTAN POTASSIUM 25 MG PO TABS
25.0000 mg | ORAL_TABLET | Freq: Every day | ORAL | Status: DC
  Filled 2011-07-24: qty 1

## 2011-07-24 MED ORDER — FUROSEMIDE 40 MG PO TABS: 40.0000 mg | ORAL_TABLET | Freq: Every day | ORAL | Status: DC

## 2011-07-24 MED ORDER — POTASSIUM CHLORIDE ER 10 MEQ PO TBCR
20.0000 meq | EXTENDED_RELEASE_TABLET | Freq: Every day | ORAL | Status: DC
  Administered 2011-07-24: 20 meq via ORAL
  Filled 2011-07-24: qty 2

## 2011-07-24 NOTE — Progress Notes (Signed)
Subjective:  No SOB or dizzyness  Objective:  Vital Signs in the last 24 hours: Temp:  [96.8 F (36 C)-97.6 F (36.4 C)] 97.6 F (36.4 C) (03/19 0500) Pulse Rate:  [68-82] 77  (03/19 0500) Resp:  [16-19] 19  (03/19 0500) BP: (102-135)/(57-87) 126/87 mmHg (03/19 0500) SpO2:  [96 %-100 %] 100 % (03/19 0500) Weight:  [102.649 kg (226 lb 4.8 oz)] 102.649 kg (226 lb 4.8 oz) (03/19 0500)  Intake/Output from previous day:  Intake/Output Summary (Last 24 hours) at 07/24/11 0924 Last data filed at 07/24/11 0900  Gross per 24 hour  Intake    720 ml  Output    950 ml  Net   -230 ml    Physical Exam: pt in BR   Rate: 78  Rhythm: normal sinus rhythm PACs, PVCs  Lab Results:  Basename 07/22/11 0617 07/21/11 2011  WBC 11.8* 13.0*  HGB 12.8* 13.1  PLT 171 170    Basename 07/24/11 0500 07/23/11 0540  NA 132* 132*  K 3.8 3.9  CL 99 98  CO2 24 23  GLUCOSE 92 107*  BUN 19 26*  CREATININE 1.04 1.14   No results found for this basename: TROPONINI:2,CK,MB:2 in the last 72 hours Hepatic Function Panel No results found for this basename: PROT,ALBUMIN,AST,ALT,ALKPHOS,BILITOT,BILIDIR,IBILI in the last 72 hours No results found for this basename: CHOL in the last 72 hours No results found for this basename: INR in the last 72 hours  Imaging: No results found.  Cardiac Studies:  Assessment/Plan:   Principal Problem:  *Syncope, admitted with dehydration, hypotension, acute renalinsufficency. Active Problems:  Cardiomyopathy, ischemic, EF 30-35% by recent cath  Hypertension  NSVT   Hypercholesteremia  COPD (chronic obstructive pulmonary disease)  GERD (gastroesophageal reflux disease)  CAD CABG 1998, patent grafts 06/06/11 after abn Myoview  Pacemaker, [employer] Jude, implanted 2008   Plan-OK for discharge. See Dr Elissa Hefty note:                       Will resume standing home Lasix dose of 40mg  daily => with weight adjusted diuretic dosage.  Check Daily wgts (target "dry wgt"  is 225lb +/- 2lb, no wgt recorded today) Educated pt re plan: Dry Wgt on discharge --> Daily AM wgts (target is Dry Wgt +/- 2 Lb)  If > 3lb up from dry wgt, take additional 1/2 dose in PM (if >5lb take additional full dose in PM);  If >3lb down from dry wgt, hold standing dose. Restart BB dose today as BP stable. Would like to restart ARB (at least @ 1/2 dose prior to d/c) I will arrange follow up in one week.  Corine Shelter PA-C 07/24/2011, 9:24 AM

## 2011-07-24 NOTE — Discharge Instructions (Signed)
[facility] [phone] home phy therapy 

## 2011-07-24 NOTE — Progress Notes (Signed)
Physical Therapy Treatment Patient Details Name: Richard Chavez MRN: [id number] DOB: 10-08-1926 Today's Date: 07/24/2011  PT Assessment/Plan  PT - Assessment/Plan Comments on Treatment Session: Patient continued to progress well, however is consistently demonstrating right lateral LOB during functional mobility. Discussed gait trial with [name] for use at home to increase functional independence and decrease risk of fall; patient in agreement with this plan. Continue POC with goal of d/c home with 24/7 supervision, use of SPC during functional mobility, and follow-up HHPT. PT Plan: Discharge plan remains appropriate Equipment Recommended: Cane PT Goals  Acute Rehab PT Goals PT Goal: Supine/Side to Sit - Progress: Progressing toward goal PT Goal: Sit to Supine/Side - Progress: Progressing toward goal PT Goal: Sit to Stand - Progress: Met PT Goal: Stand to Sit - Progress: Met PT Goal: Ambulate - Progress: Progressing toward goal PT Goal: Perform Home Exercise Program - Progress: Progressing toward goal  PT Treatment Precautions/Restrictions  Precautions Precautions: Fall Required Braces or Orthoses: No Restrictions Weight Bearing Restrictions: No Mobility (including Balance) Transfers Sit to Stand: 6: Modified independent (Device/Increase time);With upper extremity assist;From chair/3-in-1 Stand to Sit: 6: Modified independent (Device/Increase time);With upper extremity assist;To chair/3-in-1 Ambulation/Gait Ambulation/Gait Assistance: 4: Min assist Ambulation/Gait Assistance Details (indicate cue type and reason): no AD in crowded hallway, focus on obstacle negotiation with three min assist LOB right Ambulation Distance (Feet): 165 Feet Assistive device: None Gait Pattern: Within Functional Limits Stairs: Yes Stairs Assistance: 4: Min assist Stairs Assistance Details (indicate cue type and reason): cues for step-to pattern to increase safety and decrease risk of fall Stair Management  Technique: One rail Right;Other (comment) (to simmulate porch rail for home entry) Number of Stairs: 11  Height of Stairs: 6  Wheelchair Mobility Wheelchair Mobility: No (gait is primary means of mobility)  High Level Balance High Level Balance Activites: Side stepping;Backward walking;Direction changes;Head turns High Level Balance Comments: min assist with no AD, focus on balance reactions and reaction time. Four LOB right lateral with horizontal head turns.   End of Session PT - End of Session Equipment Utilized During Treatment: Gait belt Activity Tolerance: Patient tolerated treatment well Patient left: in chair General Behavior During Session: [name] for tasks performed Cognition: [name] for tasks performed  [name] 07/24/2011, 11:28 AM

## 2011-07-24 NOTE — Discharge Summary (Signed)
Physician Discharge Summary  Patient ID: Richard Chavez MRN: [id number] DOB/AGE: 01/21/1927 78 y.o.  Admit date: 07/21/2011 Discharge date: 07/24/2011  Primary Care Physician:  Dorcas Carrow, MD, MD   Discharge Diagnoses:   .Syncope, admitted with dehydration, hypotension, acute renalinsufficency. .Pacemaker, St Jude, implanted 2008 .Hyponatremia, 123 on admission, (same as 06/30/11) .Hypertension .GERD (gastroesophageal reflux disease) .Cardiomyopathy, ischemic, EF 30-35% by recent cath .Dehydration .ARF (acute renal failure) .Hypokalemia .COPD (chronic obstructive pulmonary disease) .Hypercholesteremia .NSVT  .CAD CABG 1998, patent grafts 06/06/11 after abn Myoview *Carotid artery stenosis (bilaterally up to 50%)    Medication List  As of 07/24/2011 11:54 AM   TAKE these medications         ADVAIR DISKUS 500-50 MCG/DOSE Aepb   Generic drug: Fluticasone-Salmeterol   Inhale 1 puff into the lungs every 12 (twelve) hours.      aspirin 325 MG tablet   Take 325 mg by mouth daily.      atorvastatin 20 MG tablet   Commonly known as: LIPITOR   Take 20 mg by mouth daily.      COMBIVENT 18-103 MCG/ACT inhaler   Generic drug: albuterol-ipratropium   Inhale 2 puffs into the lungs 2 (two) times daily.      Fish Oil 1000 MG Caps   Take 1 capsule by mouth daily.      fluticasone 50 MCG/ACT nasal spray   Commonly known as: FLONASE   2 sprays in each nostril twice daily      furosemide 40 MG tablet   Commonly known as: LASIX   Take 1 tablet (40 mg total) by mouth daily.      gabapentin 300 MG capsule   Commonly known as: NEURONTIN   Take 300 mg by mouth 3 (three) times daily.      guaiFENesin 600 MG 12 hr tablet   Commonly known as: MUCINEX   Take 600 mg by mouth daily.      loratadine 10 MG tablet   Commonly known as: CLARITIN   Take 10 mg by mouth daily.      losartan 50 MG tablet   Commonly known as: COZAAR   Take 0.5 tablets (25 mg total) by mouth daily.     metoprolol tartrate 25 MG tablet   Commonly known as: LOPRESSOR   Take 0.5 tablets (12.5 mg total) by mouth 2 (two) times daily.      potassium chloride 10 MEQ tablet   Commonly known as: K-DUR   Take 20 mEq by mouth 2 (two) times daily.      predniSONE 5 MG tablet   Commonly known as: DELTASONE   Take 5 mg by mouth daily.      ranitidine 150 MG capsule   Commonly known as: ZANTAC   Take 150 mg by mouth 2 (two) times daily.      RAPAFLO 8 MG Caps capsule   Generic drug: silodosin   Take 8 mg by mouth daily with breakfast. Once daily      SLOW-MAG 535 (64 MG) MG Tbcr   Generic drug: Magnesium Chloride   Take 1 tablet by mouth 2 (two) times daily.      tiotropium 18 MCG inhalation capsule   Commonly known as: SPIRIVA   Place 18 mcg into inhaler and inhale daily.      Vitamin D (Ergocalciferol) 50000 UNITS Caps   Commonly known as: DRISDOL   Take 50,000 Units by mouth every 7 (seven) days. Take on Fridays  vitamin E 1000 UNIT capsule   Take 1,000 Units by mouth daily.             Disposition and Follow-up:  Patient discharge in improved condition, no longer complaining of dizziness, lightheadedness and w/o orthostasis. His medications has been adjusted to prevent hypotension and also over-diuresis. He will follow with cardiology in 1 week and with PCP in 2 weeks. Will follow discharge instructions and medications as prescribed.  Patient will required BMET to follow electrolytes and also renal function on his follow up visit.  Consults:   Cardiology   Significant Diagnostic Studies:  Dg Chest 2 View  07/21/2011  *RADIOLOGY REPORT*  Clinical Data: Weakness  CHEST - 2 VIEW  Comparison: Chest radiograph 07/05/2011  Findings: Right-sided pacemaker and sternal wires overlie stable enlarged heart silhouette.  There is basilar atelectasis similar to prior.  Right upper lobe atelectasis is also similar.  There is mild interstitial edema pattern which is improved compared  to prior.  No focal consolidation.  No pneumothorax.  IMPRESSION:  1.  Cardiomegaly interstitial edema. 2.  Lower lobe atelectasis and right midlung atelectasis unchanged.  Original Report Authenticated By: Genevive Bi, M.D.   Vascular Ultrasound  Carotid Duplex (Doppler) has been completed. Preliminary findings: Bilaterally 40-59% ICA stenosis. Antegrade vertebral flow.      Brief H and P: 78 year old male with a past medical history significant for coronary artery disease, hypertension, hyperlipidemia, congestive heart failure he, and recent admission secondary to hyponatremia. Who came to the hospital complaining of generalized weakness and episode of syncope and collapse air in the morning. Patient was accompanied by his son who helps with a history; he reports that the patient had been complaining of generalized weakness since he was discharged on 07/07/2010. He had experienced multiple episodes of getting weak/DC space only after changing position (standing), was seen by his PCP 1 day prior to admission when he was found to be orthostatic and was told to stop diuretics. They endorses that just this week patient was also started on zaroxolyn.   In the ED patient was found dehydrated, with positive orthostatic changes; acute renal failure, hypokalemia and hyponatremia. Chest x-ray do not demonstrate infiltrates, there were no elevation of his WBC's and urine did not show signs of infection.    Hospital Course:  1-Syncope: No chest pain, no shortness breath. Patient at this moment not complaining of any dizziness and feeling a lot better. Syncope most likely due to hypotension and orthostasis. He has been discharged on lasix 40mg  daily and also adjusted antihypertensive regimen to prevent any further problems. Instructed on amount of fluids and sodium to intake to prevent fluid overload. He will follow with cardiology in 1 week for further adjustments and also to follow electrolytes and renal  function.  2-Hypertension: BP stable; Patient instructed to follow metorpolol, cozaar and lasix at reduced dose. Further adjustments to be done as needed during his follow up.  3-Hypercholesteremia: continue statins   4-COPD (chronic obstructive pulmonary disease): stable. Continue current inhaler therapy.   5-GERD (gastroesophageal reflux disease): continue PPI.   6-Hyponatremia: 132 at discharge and asymptomatic. Will follow electrolytes in 1 week during his follow up. Dante Gang, St Jude, implanted 2008: working properly. To continue monitoring as previously scheduled by cardiology  8-Cardiomyopathy, ischemic, EF 30-35% by recent cath: Continue B-blocker and also ARB at reduced dose; further adjustments per cardiology.    9-Hypokalemia: repleted. Due to overdiuresis and volume contraction 2/2 dehydration. Resolved with IVF's.  10-ARF (acute  renal failure): Back to normal. Cr 1.04  11-Mild Carotid artery stenosis: continue ASA and statins; no surgery required at this moment.    Time spent on Discharge: 40 minutes  Signed: Vannia Pola 07/24/2011, 11:54 AM

## 2011-07-24 NOTE — Progress Notes (Signed)
   CARE MANAGEMENT NOTE 07/24/2011  Patient:  Richard Chavez, Richard Chavez   Account Number:  [id number]  Date Initiated:  07/24/2011  Documentation initiated by:  Junius Creamer  Subjective/Objective Assessment:   adm w syncope     Action/Plan:   lives w fam, pcp dr Jacobo Forest, has rolling walker   Anticipated DC Date:  07/24/2011   Anticipated DC Plan:  HOME W HOME HEALTH SERVICES      DC Planning Services  CM consult      [facility] Choice  HOME HEALTH   Choice offered to / List presented to:  C-1 Patient        HH arranged  HH-2 PT      [facility] agency  [facility]   Status of service:   Medicare Important Message given?   (If response is "NO", the following Medicare IM given date fields will be blank) Date Medicare IM given:   Date Additional Medicare IM given:    Discharge Disposition:  HOME W HOME HEALTH SERVICES  Per UR Regulation:    If discussed at Long Length of Stay Meetings, dates discussed:    Comments:  3/19 spoke w pt and went over hhc agency list. no pref. has heard of [facility]. ref to [employer]. copy of hhc agency list on chart. debbie Ahijah Devery rn,bsn [phone] HOME HEALTH AGENCIES SERVING [place]   Agencies that are Medicare-Certified and are affiliated with [facility]  Telephone Number Address  [facility]   [facility] has ownership interest in this company; however, you are under no obligation to use this agency. [phone] or  [phone] [address] [place], [place] [address]   Agencies that are Medicare-Certified and are not affiliated with [facility] Agency Telephone Number Address  [facility] [phone] Fax [phone] [address] [place], [place]  [address]  [facility] [phone] or  [phone] Fax [phone] [address] [place], [place] [address]  [facility] [phone] Fax [phone] [address] [place], [place] [address]  [facility] [phone] Fax [phone] [address] [place], [place]  [address]  [facility] [phone] Fax [phone] [address] Suite [place], [place] [address]  [facility] [phone] [address] [place], [place] [address]  [facility] [phone]  [address] [place], [place] [address]  [facility] [phone] or [phone] Fax [phone] [address] [place], [place]  [address]  [facility] [phone] Fax [phone] [address] [place], [place]  [address]  [facility]  [phone] Fax [phone] [address] [place], [place] [address]

## 2011-07-25 LAB — VITAMIN D 1,25 DIHYDROXY
Vitamin D 1, 25 (OH)2 Total: 21 pg/mL (ref 18–72)
Vitamin D2 1, 25 (OH)2: 21 pg/mL
Vitamin D3 1, 25 (OH)2: <8 pg/mL

## 2011-10-24 ENCOUNTER — Other Ambulatory Visit: Payer: Self-pay | Admitting: Urology

## 2011-10-31 ENCOUNTER — Encounter (HOSPITAL_COMMUNITY): Payer: Self-pay | Admitting: Pharmacy Technician

## 2011-11-01 ENCOUNTER — Encounter (HOSPITAL_COMMUNITY)
Admission: RE | Admit: 2011-11-01 | Discharge: 2011-11-01 | Disposition: A | Discharge status: Home / Self Care | Payer: Medicare Other | Source: Ambulatory Visit | Attending: Urology | Admitting: Urology

## 2011-11-01 ENCOUNTER — Encounter (HOSPITAL_COMMUNITY): Payer: Self-pay

## 2011-11-01 ENCOUNTER — Ambulatory Visit (HOSPITAL_COMMUNITY)
Admission: RE | Admit: 2011-11-01 | Discharge: 2011-11-01 | Disposition: A | Discharge status: Home / Self Care | Payer: Medicare Other | Source: Ambulatory Visit | Attending: Urology | Admitting: Urology

## 2011-11-01 DIAGNOSIS — J4489 Other specified chronic obstructive pulmonary disease: Secondary | ICD-10-CM | POA: Insufficient documentation

## 2011-11-01 DIAGNOSIS — Z01812 Encounter for preprocedural laboratory examination: Secondary | ICD-10-CM | POA: Insufficient documentation

## 2011-11-01 DIAGNOSIS — Z951 Presence of aortocoronary bypass graft: Secondary | ICD-10-CM | POA: Insufficient documentation

## 2011-11-01 DIAGNOSIS — I517 Cardiomegaly: Secondary | ICD-10-CM | POA: Insufficient documentation

## 2011-11-01 DIAGNOSIS — Z01818 Encounter for other preprocedural examination: Secondary | ICD-10-CM | POA: Insufficient documentation

## 2011-11-01 DIAGNOSIS — J449 Chronic obstructive pulmonary disease, unspecified: Secondary | ICD-10-CM | POA: Insufficient documentation

## 2011-11-01 DIAGNOSIS — Z950 Presence of cardiac pacemaker: Secondary | ICD-10-CM | POA: Insufficient documentation

## 2011-11-01 HISTORY — DX: Peripheral vascular disease, unspecified: I73.9

## 2011-11-01 HISTORY — DX: Unspecified osteoarthritis, unspecified site: M19.90

## 2011-11-01 HISTORY — DX: Sleep apnea, unspecified: G47.30

## 2011-11-01 HISTORY — DX: Acute myocardial infarction, unspecified: I21.9

## 2011-11-01 HISTORY — DX: Benign prostatic hyperplasia without lower urinary tract symptoms: N40.0

## 2011-11-01 HISTORY — DX: Polyp of colon: K63.5

## 2011-11-01 LAB — CBC
MCH: 31.3 pg (ref 26.0–34.0)
MCHC: 33.3 g/dL (ref 30.0–36.0)
MCV: 94 fL (ref 78.0–100.0)
Platelets: 176 10*3/uL (ref 150–400)
RBC: 4.85 MIL/uL (ref 4.22–5.81)

## 2011-11-01 LAB — SURGICAL PCR SCREEN: MRSA, PCR: NEGATIVE

## 2011-11-01 NOTE — Patient Instructions (Addendum)
20 BASILIO MEADOW  11/01/2011   Your procedure is scheduled on:  11/15/11 AT 10:15 AM  Report to SHORT STAY DEPT  at 7:45  AM.  Call this number if you have problems the morning of surgery: [phone]   Remember:   Do not eat food or drink liquids AFTER MIDNIGHT    Take these medicines the morning of surgery with A SIP OF WATER: ATORVASTATIN / GABAPENTIN / MUCINEX / CLARITIN / METOPROLOL / PREDNISONE / RANITIDINE / USE COMBIVENT, ADVAIR , SPIRIVA INHALERS / MAY USE FLONASE NASAL SPRAY   Do not wear jewelry, make-up or nail polish.  Do not wear lotions, powders, or perfumes.   Do not shave legs or underarms 48 hrs. before surgery (men may shave face)  Do not bring valuables to the hospital.  Contacts, dentures or bridgework may not be worn into surgery.  Leave suitcase in the car. After surgery it may be brought to your room.  For patients admitted to the hospital, checkout time is 11:00 AM the day of discharge.   Patients discharged the day of surgery will not be allowed to drive home.    Special Instructions:   Please read over the following fact sheets that you were given: MRSA  Information / Incentive Spirometer               SHOWER WITH BETASEPT THE NIGHT BEFORE SURGERY AND THE MORNING OF SURGERY

## 2011-11-01 NOTE — Progress Notes (Signed)
11/01/11 1511  OBSTRUCTIVE SLEEP APNEA  Have you ever been diagnosed with sleep apnea through a sleep study? No  Do you snore loudly (loud enough to be heard through closed doors)?  0  Do you often feel tired, fatigued, or sleepy during the daytime? 0  Has anyone observed you stop breathing during your sleep? 0  Do you have, or are you being treated for high blood pressure? 1  BMI more than 35 kg/m2? 0  Age over 76 years old? 1  Neck circumference greater than 40 cm/18 inches? 1  Gender: 1  Obstructive Sleep Apnea Score 4   Score 4 or greater  Updated health history;Results sent to PCP

## 2011-11-15 ENCOUNTER — Encounter (HOSPITAL_COMMUNITY): Payer: Self-pay | Admitting: Anesthesiology

## 2011-11-15 ENCOUNTER — Encounter (HOSPITAL_COMMUNITY)
Admission: RE | Disposition: A | Discharge status: Home / Self Care | Payer: Self-pay | Source: Ambulatory Visit | Attending: Urology

## 2011-11-15 ENCOUNTER — Inpatient Hospital Stay (HOSPITAL_COMMUNITY)
Admission: RE | Admit: 2011-11-15 | Discharge: 2011-11-16 | DRG: 714 | Disposition: A | Discharge status: Home / Self Care | Payer: Medicare Other | Source: Ambulatory Visit | Attending: Urology | Admitting: Urology

## 2011-11-15 ENCOUNTER — Inpatient Hospital Stay: Admit: 2011-11-15 | Payer: Self-pay | Admitting: Urology

## 2011-11-15 ENCOUNTER — Ambulatory Visit (HOSPITAL_COMMUNITY): Payer: Medicare Other | Admitting: Anesthesiology

## 2011-11-15 ENCOUNTER — Encounter (HOSPITAL_COMMUNITY): Payer: Self-pay | Admitting: *Deleted

## 2011-11-15 DIAGNOSIS — N400 Enlarged prostate without lower urinary tract symptoms: Secondary | ICD-10-CM

## 2011-11-15 DIAGNOSIS — N401 Enlarged prostate with lower urinary tract symptoms: Principal | ICD-10-CM | POA: Diagnosis present

## 2011-11-15 DIAGNOSIS — R350 Frequency of micturition: Secondary | ICD-10-CM | POA: Diagnosis present

## 2011-11-15 DIAGNOSIS — I252 Old myocardial infarction: Secondary | ICD-10-CM

## 2011-11-15 DIAGNOSIS — K219 Gastro-esophageal reflux disease without esophagitis: Secondary | ICD-10-CM | POA: Diagnosis present

## 2011-11-15 DIAGNOSIS — N138 Other obstructive and reflux uropathy: Principal | ICD-10-CM | POA: Diagnosis present

## 2011-11-15 DIAGNOSIS — Z950 Presence of cardiac pacemaker: Secondary | ICD-10-CM

## 2011-11-15 DIAGNOSIS — I10 Essential (primary) hypertension: Secondary | ICD-10-CM | POA: Diagnosis present

## 2011-11-15 DIAGNOSIS — Z951 Presence of aortocoronary bypass graft: Secondary | ICD-10-CM

## 2011-11-15 DIAGNOSIS — G4730 Sleep apnea, unspecified: Secondary | ICD-10-CM | POA: Diagnosis present

## 2011-11-15 DIAGNOSIS — J449 Chronic obstructive pulmonary disease, unspecified: Secondary | ICD-10-CM | POA: Diagnosis present

## 2011-11-15 DIAGNOSIS — J4489 Other specified chronic obstructive pulmonary disease: Secondary | ICD-10-CM | POA: Diagnosis present

## 2011-11-15 DIAGNOSIS — I2510 Atherosclerotic heart disease of native coronary artery without angina pectoris: Secondary | ICD-10-CM | POA: Diagnosis present

## 2011-11-15 DIAGNOSIS — I739 Peripheral vascular disease, unspecified: Secondary | ICD-10-CM | POA: Diagnosis present

## 2011-11-15 HISTORY — DX: Benign prostatic hyperplasia without lower urinary tract symptoms: N40.0

## 2011-11-15 LAB — BASIC METABOLIC PANEL
CO2: 24 mEq/L (ref 19–32)
Calcium: 9 mg/dL (ref 8.4–10.5)
Creatinine, Ser: 1.29 mg/dL (ref 0.50–1.35)
GFR calc non Af Amer: 50 mL/min — ABNORMAL LOW (ref >90)
Sodium: 135 mEq/L (ref 135–145)

## 2011-11-15 SURGERY — TRANSURETHRAL PROSTATECTOMY WITH GYRUS INSTRUMENTS
Anesthesia: Spinal | Wound class: Clean Contaminated

## 2011-11-15 MED ORDER — LOSARTAN POTASSIUM 25 MG PO TABS
25.0000 mg | ORAL_TABLET | ORAL | Status: DC
  Administered 2011-11-16: 25 mg via ORAL
  Filled 2011-11-15: qty 1

## 2011-11-15 MED ORDER — BELLADONNA ALKALOIDS-OPIUM 16.2-60 MG RE SUPP
RECTAL | Status: DC | PRN
  Administered 2011-11-15: 1 via RECTAL

## 2011-11-15 MED ORDER — SODIUM CHLORIDE 0.9 % IR SOLN
Status: DC | PRN
  Administered 2011-11-15: 33000 mL

## 2011-11-15 MED ORDER — PREDNISONE 5 MG PO TABS
5.0000 mg | ORAL_TABLET | Freq: Every day | ORAL | Status: DC
Start: 2011-11-16 — End: 2011-11-16
  Administered 2011-11-16: 5 mg via ORAL
  Filled 2011-11-15 (×2): qty 1

## 2011-11-15 MED ORDER — BELLADONNA ALKALOIDS-OPIUM 16.2-60 MG RE SUPP
RECTAL | Status: AC
  Filled 2011-11-15: qty 1

## 2011-11-15 MED ORDER — HYOSCYAMINE SULFATE 0.125 MG SL SUBL
0.1250 mg | SUBLINGUAL_TABLET | SUBLINGUAL | Status: DC | PRN
  Filled 2011-11-15: qty 1

## 2011-11-15 MED ORDER — FLUTICASONE-SALMETEROL 500-50 MCG/DOSE IN AEPB
1.0000 | INHALATION_SPRAY | Freq: Two times a day (BID) | RESPIRATORY_TRACT | Status: DC
  Administered 2011-11-15 – 2011-11-16 (×2): 1 via RESPIRATORY_TRACT
  Filled 2011-11-15: qty 14

## 2011-11-15 MED ORDER — MAGNESIUM CHLORIDE 64 MG PO TBEC
1.0000 | DELAYED_RELEASE_TABLET | Freq: Two times a day (BID) | ORAL | Status: DC
  Administered 2011-11-16 (×2): 64 mg via ORAL
  Filled 2011-11-15 (×4): qty 1

## 2011-11-15 MED ORDER — PHENYLEPHRINE HCL 10 MG/ML IJ SOLN
INTRAMUSCULAR | Status: DC | PRN
  Administered 2011-11-15: 40 ug via INTRAVENOUS

## 2011-11-15 MED ORDER — POTASSIUM CHLORIDE ER 10 MEQ PO TBCR
10.0000 meq | EXTENDED_RELEASE_TABLET | Freq: Two times a day (BID) | ORAL | Status: DC
  Administered 2011-11-15 – 2011-11-16 (×2): 10 meq via ORAL
  Filled 2011-11-15 (×3): qty 1

## 2011-11-15 MED ORDER — PROPOFOL 10 MG/ML IV EMUL
INTRAVENOUS | Status: DC | PRN
  Administered 2011-11-15: 50 ug/kg/min via INTRAVENOUS

## 2011-11-15 MED ORDER — GUAIFENESIN ER 600 MG PO TB12
600.0000 mg | ORAL_TABLET | Freq: Every day | ORAL | Status: DC
  Administered 2011-11-16: 600 mg via ORAL
  Filled 2011-11-15: qty 1

## 2011-11-15 MED ORDER — CEFAZOLIN SODIUM 1-5 GM-% IV SOLN: 1.0000 g | INTRAVENOUS | Status: DC

## 2011-11-15 MED ORDER — ATORVASTATIN CALCIUM 20 MG PO TABS
20.0000 mg | ORAL_TABLET | Freq: Every day | ORAL | Status: DC
  Administered 2011-11-16: 20 mg via ORAL
  Filled 2011-11-15: qty 1

## 2011-11-15 MED ORDER — SODIUM CHLORIDE 0.9 % IR SOLN: 3000.0000 mL | Status: DC

## 2011-11-15 MED ORDER — PROMETHAZINE HCL 25 MG/ML IJ SOLN: 6.2500 mg | INTRAMUSCULAR | Status: DC | PRN

## 2011-11-15 MED ORDER — CEFAZOLIN SODIUM-DEXTROSE 2-3 GM-% IV SOLR
INTRAVENOUS | Status: AC
  Filled 2011-11-15: qty 50

## 2011-11-15 MED ORDER — ZOLPIDEM TARTRATE 5 MG PO TABS: 5.0000 mg | ORAL_TABLET | Freq: Every evening | ORAL | Status: DC | PRN

## 2011-11-15 MED ORDER — ONDANSETRON HCL 4 MG/2ML IJ SOLN: 4.0000 mg | INTRAMUSCULAR | Status: DC | PRN

## 2011-11-15 MED ORDER — ACETAMINOPHEN 10 MG/ML IV SOLN
1000.0000 mg | Freq: Four times a day (QID) | INTRAVENOUS | Status: DC
  Administered 2011-11-15 – 2011-11-16 (×3): 1000 mg via INTRAVENOUS
  Filled 2011-11-15 (×4): qty 100

## 2011-11-15 MED ORDER — FENTANYL CITRATE 0.05 MG/ML IJ SOLN
INTRAMUSCULAR | Status: DC | PRN
Start: 2011-11-15 — End: 2011-11-15
  Administered 2011-11-15 (×2): 50 ug via INTRAVENOUS

## 2011-11-15 MED ORDER — OMEGA-3-ACID ETHYL ESTERS 1 G PO CAPS
1.0000 | ORAL_CAPSULE | Freq: Every day | ORAL | Status: DC
  Administered 2011-11-16: 1 g via ORAL
  Filled 2011-11-15: qty 1

## 2011-11-15 MED ORDER — FAMOTIDINE 10 MG PO TABS
10.0000 mg | ORAL_TABLET | Freq: Every day | ORAL | Status: DC
  Administered 2011-11-15 – 2011-11-16 (×2): 10 mg via ORAL
  Filled 2011-11-15 (×2): qty 1

## 2011-11-15 MED ORDER — BISACODYL 10 MG RE SUPP: 10.0000 mg | Freq: Every day | RECTAL | Status: DC | PRN

## 2011-11-15 MED ORDER — HYDROMORPHONE HCL PF 1 MG/ML IJ SOLN: 0.5000 mg | INTRAMUSCULAR | Status: DC | PRN

## 2011-11-15 MED ORDER — CEFAZOLIN SODIUM-DEXTROSE 2-3 GM-% IV SOLR
2.0000 g | INTRAVENOUS | Status: AC
  Administered 2011-11-15: 2 g via INTRAVENOUS

## 2011-11-15 MED ORDER — LACTATED RINGERS IV SOLN
INTRAVENOUS | Status: DC
Start: 2011-11-15 — End: 2011-11-15

## 2011-11-15 MED ORDER — FLUTICASONE PROPIONATE 50 MCG/ACT NA SUSP
1.0000 | Freq: Every day | NASAL | Status: DC
  Filled 2011-11-15: qty 16

## 2011-11-15 MED ORDER — ACETAMINOPHEN 10 MG/ML IV SOLN
INTRAVENOUS | Status: AC
  Filled 2011-11-15: qty 100

## 2011-11-15 MED ORDER — DIPHENHYDRAMINE HCL 12.5 MG/5ML PO ELIX: 12.5000 mg | ORAL_SOLUTION | Freq: Four times a day (QID) | ORAL | Status: DC | PRN

## 2011-11-15 MED ORDER — LORATADINE 10 MG PO TABS
10.0000 mg | ORAL_TABLET | Freq: Every day | ORAL | Status: DC
Start: 2011-11-16 — End: 2011-11-16
  Administered 2011-11-16: 10 mg via ORAL
  Filled 2011-11-15 (×2): qty 1

## 2011-11-15 MED ORDER — ACETAMINOPHEN 10 MG/ML IV SOLN
INTRAVENOUS | Status: DC | PRN
  Administered 2011-11-15: 1000 mg via INTRAVENOUS

## 2011-11-15 MED ORDER — GABAPENTIN 300 MG PO CAPS
300.0000 mg | ORAL_CAPSULE | Freq: Three times a day (TID) | ORAL | Status: DC
  Administered 2011-11-15 – 2011-11-16 (×3): 300 mg via ORAL
  Filled 2011-11-15 (×5): qty 1

## 2011-11-15 MED ORDER — SODIUM CHLORIDE 0.45 % IV SOLN
INTRAVENOUS | Status: DC
  Administered 2011-11-15 – 2011-11-16 (×3): via INTRAVENOUS

## 2011-11-15 MED ORDER — FENTANYL CITRATE 0.05 MG/ML IJ SOLN: 25.0000 ug | INTRAMUSCULAR | Status: DC | PRN

## 2011-11-15 MED ORDER — DIPHENHYDRAMINE HCL 50 MG/ML IJ SOLN: 12.5000 mg | Freq: Four times a day (QID) | INTRAMUSCULAR | Status: DC | PRN

## 2011-11-15 MED ORDER — METOPROLOL TARTRATE 25 MG PO TABS
25.0000 mg | ORAL_TABLET | Freq: Two times a day (BID) | ORAL | Status: DC
  Administered 2011-11-15 – 2011-11-16 (×2): 25 mg via ORAL
  Filled 2011-11-15 (×3): qty 1

## 2011-11-15 MED ORDER — CIPROFLOXACIN HCL 250 MG PO TABS
250.0000 mg | ORAL_TABLET | Freq: Two times a day (BID) | ORAL | Status: DC
  Administered 2011-11-15 – 2011-11-16 (×2): 250 mg via ORAL
  Filled 2011-11-15 (×4): qty 1

## 2011-11-15 MED ORDER — LIDOCAINE HCL (CARDIAC) 20 MG/ML IV SOLN
INTRAVENOUS | Status: DC | PRN
  Administered 2011-11-15: 50 mg via INTRAVENOUS

## 2011-11-15 MED ORDER — IPRATROPIUM-ALBUTEROL 18-103 MCG/ACT IN AERO
2.0000 | INHALATION_SPRAY | Freq: Two times a day (BID) | RESPIRATORY_TRACT | Status: DC
  Administered 2011-11-15 – 2011-11-16 (×2): 2 via RESPIRATORY_TRACT
  Filled 2011-11-15: qty 14.7

## 2011-11-15 MED ORDER — TIOTROPIUM BROMIDE MONOHYDRATE 18 MCG IN CAPS
18.0000 ug | ORAL_CAPSULE | Freq: Every day | RESPIRATORY_TRACT | Status: DC
  Administered 2011-11-16: 18 ug via RESPIRATORY_TRACT
  Filled 2011-11-15: qty 5

## 2011-11-15 MED ORDER — FUROSEMIDE 40 MG PO TABS
40.0000 mg | ORAL_TABLET | Freq: Every day | ORAL | Status: DC
  Administered 2011-11-15 – 2011-11-16 (×2): 40 mg via ORAL
  Filled 2011-11-15 (×2): qty 1

## 2011-11-15 MED ORDER — HYDROCODONE-ACETAMINOPHEN 5-325 MG PO TABS
1.0000 | ORAL_TABLET | ORAL | Status: DC | PRN
Start: 2011-11-15 — End: 2011-11-16

## 2011-11-15 MED ORDER — BUPIVACAINE IN DEXTROSE 0.75-8.25 % IT SOLN
INTRATHECAL | Status: DC | PRN
  Administered 2011-11-15: 1.8 mL via INTRATHECAL

## 2011-11-15 MED ORDER — LACTATED RINGERS IV SOLN
INTRAVENOUS | Status: DC
Start: 2011-11-15 — End: 2011-11-15
  Administered 2011-11-15: 1000 mL via INTRAVENOUS

## 2011-11-15 MED ORDER — BACITRACIN-NEOMYCIN-POLYMYXIN 400-5-5000 EX OINT: 1.0000 "application " | TOPICAL_OINTMENT | Freq: Three times a day (TID) | CUTANEOUS | Status: DC | PRN

## 2011-11-15 SURGICAL SUPPLY — 33 items
BAG URINE DRAINAGE (UROLOGICAL SUPPLIES) ×2 IMPLANT
BAG URO CATCHER STRL LF (DRAPE) ×2 IMPLANT
BLADE SURG 15 STRL LF DISP TIS (BLADE) IMPLANT
BLADE SURG 15 STRL SS (BLADE)
CATH AINSWORTH 30CC 24FR (CATHETERS) IMPLANT
CATH FOLEY 3WAY 30CC 24FR (CATHETERS)
CATH HEMA 3WAY 30CC 24FR COUDE (CATHETERS) ×2 IMPLANT
CATH URO 16X24FR 3W FL PS (CATHETERS) IMPLANT
CLOTH BEACON ORANGE TIMEOUT ST (SAFETY) ×2 IMPLANT
DRAPE CAMERA CLOSED 9X96 (DRAPES) ×2 IMPLANT
ELECT HF RESECT BIPO 24F 45 ND (CUTTING LOOP) ×1 IMPLANT
ELECT LOOP MED HF 24F 12D (CUTTING LOOP) IMPLANT
ELECT LOOP MED HF 24F 12D CBL (CLIP) ×1 IMPLANT
ELECT REM PT RETURN 9FT ADLT (ELECTROSURGICAL)
ELECTRODE REM PT RTRN 9FT ADLT (ELECTROSURGICAL) ×1 IMPLANT
EVACUATOR MICROVAS BLADDER (UROLOGICAL SUPPLIES) ×2 IMPLANT
GLOVE BIOGEL M STRL SZ7.5 (GLOVE) ×2 IMPLANT
GOWN STRL NON-REIN LRG LVL3 (GOWN DISPOSABLE) ×2 IMPLANT
GOWN STRL REIN XL XLG (GOWN DISPOSABLE) ×2 IMPLANT
HOLDER FOLEY CATH W/STRAP (MISCELLANEOUS) ×1 IMPLANT
KIT ASPIRATION TUBING (SET/KITS/TRAYS/PACK) ×2 IMPLANT
KIT SUPRAPUBIC CATH (MISCELLANEOUS) IMPLANT
MANIFOLD NEPTUNE II (INSTRUMENTS) ×2 IMPLANT
MARKER SKIN DUAL TIP RULER LAB (MISCELLANEOUS) ×2 IMPLANT
NDL SPNL 18GX3.5 QUINCKE PK (NEEDLE) IMPLANT
NEEDLE SPNL 18GX3.5 QUINCKE PK (NEEDLE) IMPLANT
NS IRRIG 1000ML POUR BTL (IV SOLUTION) ×3 IMPLANT
PACK CYSTO (CUSTOM PROCEDURE TRAY) ×2 IMPLANT
SCRUB PCMX 4 OZ (MISCELLANEOUS) ×1 IMPLANT
SUT ETHILON 3 0 PS 1 (SUTURE) IMPLANT
SYR 30ML LL (SYRINGE) ×1 IMPLANT
SYRINGE IRR TOOMEY STRL 70CC (SYRINGE) ×2 IMPLANT
TUBING CONNECTING 10 (TUBING) ×2 IMPLANT

## 2011-11-15 NOTE — Anesthesia Preprocedure Evaluation (Signed)
Anesthesia Evaluation  Patient identified by MRN, date of birth, ID band Patient awake    Reviewed: Allergy & Precautions, H&P , NPO status , Patient's Chart, lab work & pertinent test results  Airway Mallampati: III TM Distance: >3 FB Neck ROM: Full    Dental  (+) Partial Upper, Edentulous Lower, Lower Dentures, Caps, Poor Dentition and Dental Advisory Given   Pulmonary sleep apnea , COPD + rhonchi         Cardiovascular hypertension, Pt. on medications and Pt. on home beta blockers + CAD, + Past MI, + CABG and + Peripheral Vascular Disease + dysrhythmias + pacemaker Rhythm:Regular Rate:Normal     Neuro/Psych negative neurological ROS  negative psych ROS   GI/Hepatic Neg liver ROS, GERD-  ,  Endo/Other  negative endocrine ROSMorbid obesity  Renal/GU negative Renal ROS  negative genitourinary   Musculoskeletal negative musculoskeletal ROS (+)   Abdominal   Peds  Hematology negative hematology ROS (+)   Anesthesia Other Findings   Reproductive/Obstetrics negative OB ROS                           Anesthesia Physical Anesthesia Plan  ASA: III  Anesthesia Plan: Spinal   Post-op Pain Management:    Induction: Intravenous  Airway Management Planned: Simple Face Mask  Additional Equipment:   Intra-op Plan:   Post-operative Plan:   Informed Consent: I have reviewed the patients History and Physical, chart, labs and discussed the procedure including the risks, benefits and alternatives for the proposed anesthesia with the patient or authorized representative who has indicated his/her understanding and acceptance.   Dental advisory given  Plan Discussed with: CRNA  Anesthesia Plan Comments:         Anesthesia Quick Evaluation

## 2011-11-15 NOTE — Anesthesia Procedure Notes (Signed)
Spinal  Patient location during procedure: OR Start time: 11/15/2011 10:27 AM End time: 11/15/2011 10:34 AM Staffing Anesthesiologist: Lucille Passy F Performed by: anesthesiologist  Preanesthetic Checklist Completed: patient identified, site marked, surgical consent, pre-op evaluation, timeout performed, IV checked, risks and benefits discussed and monitors and equipment checked Spinal Block Patient position: sitting Prep: Betadine Patient monitoring: heart rate, continuous pulse ox and blood pressure Approach: midline Location: L3-4 Injection technique: single-shot Needle Needle type: Quincke  Needle gauge: 25 G Needle length: 9 cm Additional Notes Expiration date of kit checked and confirmed. Patient tolerated procedure well, without complications. Negative heme/paresthesia Lot [id number] DOE 01/2013

## 2011-11-15 NOTE — Interval H&P Note (Signed)
History and Physical Interval Note:  11/15/2011 10:02 AM  Richard Chavez  has presented today for surgery, with the diagnosis of benign prostatic hypertrophy   The various methods of treatment have been discussed with the patient and family. After consideration of risks, benefits and other options for treatment, the patient has consented to  Procedure(s) (LRB): TRANSURETHRAL PROSTATECTOMY WITH GYRUS INSTRUMENTS (N/A) as a surgical intervention .  The patient's history has been reviewed, patient examined, no change in status, stable for surgery.  I have reviewed the patients' chart and labs.  Questions were answered to the patient's satisfaction.     [name] I

## 2011-11-15 NOTE — H&P (Signed)
Chief Complaint  Dr. Thayer Headings [facility]   Reason For Visit  Cystoscopy, flowrate, PUS, and to discuss options   Active Problems Problems  1. Benign Prostatic Hypertrophy 600.00 2. Bladder Calculus 594.1 3. 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 PSA,Elevated 790.93 6. Urge Incontinence Of Urine 788.31 7. Urinary Frequency 788.41 8. Urinary Stream Is Smaller 788.62  History of Present Illness          Richard Chavez is an 78 yo male originally referred by Dr. Thea Silversmith for evaluation of BPH per Dr. Sherron Monday. He was referred to me for evaluation for cooled thermotherapy, and returns today for cystosocpy, flowrate, PUS & to discuss options.   ROS shows that pt has a pacemaker,making him ineligible for cmtt.    He has a hx of BPH, with IPSS= 32 (N= 7, and max 35). He has symptoms of urinary frequency, urgency, poor stream, nocturia, urgency and frequency affecting his quality of life.  He is on Rapaflo, with an elevated PSA of 4.58.    Review of systems: No change in bowel or neurologic status.  Cardiac hx:  Significant for atrial fibrillation with a PACEMAKER in place.  Note hx of I/D scrotal abscess.    Past Medical History Problems  1. History of  Abscess Of The Scrotum 608.4 2. History of  Acute Myocardial Infarction V12.59 3. History of  Anxiety (Symptom) 300.00 4. History of  Arthritis V13.4 5. History of  Asthma 493.90 6. History of  Atrial Fibrillation 427.31 7. History of  Coronary Artery Disease V12.59 8. History of  Esophageal Reflux 530.81 9. History of  Murmurs 785.2 10. History of  Pacemaker Placement V53.31 11. History of  Rhythm Disorder 427.9  Surgical History Problems  1. History of  CABG (CABG) 2. History of  Lithotripsy  Current Meds 1. Adult Strength Aspirin TABS; Therapy: (Recorded:14Jan2008) to 2. Advair Diskus 500-50 MCG/DOSE Inhalation Aerosol Powder Breath Activated; Therapy:  (Recorded:09Apr2012) to 3.  Alendronate Sodium 70 MG Oral Tablet; Therapy: (Recorded:06Jan2010) to 4. Cialis 20 MG Oral Tablet; TAKE 1 TABLET 1 HOUR BEFORE ACTIVITY AS NEEDED; Therapy:  07Feb2011 to (Last Rx:07Feb2011) 5. Combivent AERO; Therapy: (Recorded:14Jan2008) to 6. Fish Oil CAPS; Therapy: (Recorded:09Apr2012) to 7. Fluticasone Propionate 50 MCG/ACT Nasal Suspension; Therapy: 10Nov2010 to 8. Furosemide 40 MG Oral Tablet; Therapy: (Recorded:09Apr2012) to 9. Gabapentin 300 MG Oral Capsule; Therapy: (Recorded:09Apr2012) to 10. Klor-Con 10 10 MEQ Oral Tablet Extended Release; Therapy: (Recorded:09Apr2012) to 11. Lipitor 20 MG Oral Tablet; Therapy: (Recorded:14Jan2008) to 12. Losartan Potassium 50 MG Oral Tablet; Therapy: (Recorded:09Apr2012) to 13. Metoprolol Succinate ER 50 MG Oral Tablet Extended Release 24 Hour; Therapy:   (Recorded:09Apr2012) to 14. Multi-Vitamin TABS; Therapy: (Recorded:09Apr2012) to 15. Nasonex 50 MCG/ACT Nasal Suspension; Therapy: (Recorded:09Apr2012) to 16. Potassium Chloride ER 10 MEQ Oral Capsule Extended Release; Therapy: 05Aug2010 to 17. PredniSONE 5 MG Oral Tablet; Therapy: (Recorded:09Apr2012) to 18. Ranitidine HCl 150 MG Oral Capsule; Therapy: 25Sep2012 to 19. Rapaflo 8 MG Oral Capsule; TAKE 1 CAPSULE DAILY WITH FOOD; Therapy: 07Feb2011 to   (Evaluate:03Jan2013); Last Rx:09Jan2012 20. Spiriva HandiHaler 18 MCG Inhalation Capsule; Therapy: (Recorded:09Apr2012) to 21. Toprol XL 100 MG Oral Tablet Extended Release 24 Hour; Therapy: (Recorded:14Jan2008) to 22. Vitamin D (Ergocalciferol) 50000 UNIT Oral Capsule; Therapy: 27Jul2010 to  Allergies Medication  1. No Known Drug Allergies  Family History Problems  1. Family history of  Family Health Status Number Of Children 2 SONS 2. Paternal history of  Leukemia V16.6 3. Family history of  Urologic Disorder  V18.7  Social History Problems  1. Family history of  Death In The Family Father AGE 19 - HEART PROBLEMS 2. Family history  of  Death In The Family Mother AGE 97 3. Marital History - Single 4. Occupation: RETIRED 5. Tobacco Use V15.82 SMOKED 1 PK A DAY FOR 15 YRS - QUIT IN 74 Denied  6. History of  Caffeine Use 1 A DAY  Review of Systems Constitutional, skin, eye, otolaryngeal, hematologic/lymphatic, cardiovascular, endocrine, musculoskeletal, gastrointestinal, neurological and psychiatric system(s) were reviewed and pertinent findings if present are noted.  Genitourinary: urinary frequency, feelings of urinary urgency, nocturia, difficulty starting the urinary stream, weak urinary stream, urinary stream starts and stops, incomplete emptying of bladder and post-void dribbling, but no dysuria and no incontinence.  Respiratory: shortness of breath, wheezing, shortness of breath during exertion and orthopnea, but no cough and no PND.    Vitals Vital Signs [Data Includes: Last 1 Day]  11Jan2013 03:50PM  Blood Pressure: 147 / 86 Temperature: 97.3 F Heart Rate: 71  Physical Exam Rectal: Rectal exam demonstrates decreased sphincter tone. Estimated prostate size is 3+. Normal rectal tone, no rectal masses, prostate is smooth, symmetric and non-tender.    Results/Data Urine [Data Includes: Last 1 Day]   11Jan2013  COLOR YELLOW   APPEARANCE CLEAR   SPECIFIC GRAVITY 1.010   pH 6.0   GLUCOSE NEG mg/dL  BILIRUBIN NEG   KETONE NEG mg/dL  BLOOD TRACE   PROTEIN NEG mg/dL  UROBILINOGEN 0.2 mg/dL  NITRITE NEG   LEUKOCYTE ESTERASE NEG   SQUAMOUS EPITHELIAL/HPF RARE   WBC NONE SEEN WBC/hpf  RBC 0-3 RBC/hpf  BACTERIA NONE SEEN   CRYSTALS NONE SEEN   CASTS NONE SEEN     PUS: 82.66 grams; Flow rate peak 8cc/sec.   Procedure  Procedure: Cystoscopy  Indication: Lower Urinary Tract Symptoms.  Informed Consent: Risks, benefits, and potential adverse events were discussed and informed consent was obtained from the patient.  Prep: The patient was prepped with betadine.  Anesthesia:. Local anesthesia was  administered intraurethrally with 2% lidocaine jelly.  Antibiotic prophylaxis: Ciprofloxacin.  Procedure Note:  Bladder:    Assessment Assessed  1. Benign Prostatic Hypertrophy 600.00 2. PSA,Elevated 790.93 3. Urinary Frequency 788.41 4. Urge Incontinence Of Urine 788.31 5. Nocturia 788.43   Significant bladder obstruction, wit elevated IPSS score sheed in and 78 yo male with pacemaker. He would be best treated with TURP @ [facility]. Will have [facility] consult for pacemaker f/u.   Plan  TURP.   Signatures

## 2011-11-15 NOTE — Op Note (Signed)
Pre-operative diagnosis : BPH  Postoperative diagnosis: Same  Operation:cysto, GYRUS TURP  Surgeon:  S. Patsi Sears, MD  First assistant:  none  Anesthesia: Spinal  Preparation: After appropriate preanesthesia, the patient was brought to the operating room, placed on the operating table in the right lateral decubitus position, where spinal anesthetic was introduced. He was then replaced in the dorsal lithotomy position where the pubis was prepped with Betadine solution, and draped in usual fashion. The arm band was double checked.  Review history: The patient is an 78 year old male, with a history of benign prostate hyperplasia, with international prostate symptom score equal to 32 (normal equal to 7). He has symptoms of urinary frequency, urgency, decreased stream, nocturia, which affect his quality of life. He has been treated with rapid flow, and has an elevation in his PSA of 4.58. His prostate is noted to be 82.66 g in size. Flow rate is 8 cc per second its is normal 25 cc per second). Note the patient has a history of atrial fibrillation, with a pacemaker in place, and a history of scrotal abscess drainage. He is now for TURP.  Statement of  Likelihood of Success: Excellent. TIME-OUT observed.:  Procedure: The cystoscope was placed within the prostatic urethra, and trilobar BPH is identified, with an enlarged median lobe. The resectoscope was placed with the gyrus instruments, and resection of the median lobe was accomplished by creating a trough at the 7:00 and the 5:00 positions, and then resecting the median lobe between the 2 trough. Following median lobe dissection, prostate was resected from the 1:00 to the 6:00 position, and from the 11:00 to the 7:00 positions. Care was taken to avoid injury to the vera room. All bleeding sites were cauterized extensively. Chips were evacuated on multiple occasions with the [facility] evacuator. After resection, a 24 [place] three-way hematuria catheter was  placed with traction, and PVR and continuous irrigation with normal saline. The patient was then taken to recovery room in good condition.

## 2011-11-15 NOTE — Anesthesia Postprocedure Evaluation (Signed)
Anesthesia Post Note  Patient: Richard Chavez  Procedure(s) Performed: Procedure(s) (LRB): TRANSURETHRAL PROSTATECTOMY WITH GYRUS INSTRUMENTS (N/A)  Anesthesia type: Spinal  Patient location: PACU  Post pain: Pain level controlled  Post assessment: Post-op Vital signs reviewed  Last Vitals:  Filed Vitals:   11/15/11 1212  BP:   Pulse: 75  Temp: 36.4 C  Resp: 12    Post vital signs: Reviewed  Level of consciousness: sedated  Complications: No apparent anesthesia complications

## 2011-11-15 NOTE — Transfer of Care (Signed)
Immediate Anesthesia Transfer of Care Note  Patient: Richard Chavez  Procedure(s) Performed: Procedure(s) (LRB): TRANSURETHRAL PROSTATECTOMY WITH GYRUS INSTRUMENTS (N/A)  Patient Location: PACU  Anesthesia Type: Spinal  Level of Consciousness: awake, alert , oriented and patient cooperative  Airway & Oxygen Therapy: Patient Spontanous Breathing and Patient connected to nasal cannula oxygen  Post-op Assessment: Report given to PACU RN, Post -op Vital signs reviewed and stable and Patient moving all extremities  Post vital signs: Reviewed and stable  Complications: No apparent anesthesia complications

## 2011-11-16 MED ORDER — CIPROFLOXACIN HCL 250 MG PO TABS: 250.0000 mg | ORAL_TABLET | Freq: Two times a day (BID) | ORAL | Status: AC

## 2011-11-16 MED ORDER — HYOSCYAMINE SULFATE 0.125 MG SL SUBL: 0.1250 mg | SUBLINGUAL_TABLET | SUBLINGUAL | Status: DC | PRN

## 2011-11-16 MED ORDER — HYDROCODONE-ACETAMINOPHEN 5-325 MG PO TABS: 1.0000 | ORAL_TABLET | ORAL | Status: AC | PRN

## 2011-11-16 MED ORDER — HYDROCODONE-ACETAMINOPHEN 5-325 MG PO TABS: 1.0000 | ORAL_TABLET | ORAL | Status: DC | PRN

## 2011-11-16 MED ORDER — CIPROFLOXACIN HCL 250 MG PO TABS: 250.0000 mg | ORAL_TABLET | Freq: Two times a day (BID) | ORAL | Status: DC

## 2011-11-16 NOTE — Discharge Summary (Signed)
Physician Discharge Summary  Patient ID: Richard Chavez MRN: [id number] DOB/AGE: [birth year] 78 y.o.  Admit date: 11/15/2011 Discharge date: 11/16/2011  Admission Diagnoses: BPH  Discharge Diagnoses:  Active Problems:  * No active hospital problems. *    Discharged Condition: good  Hospital Course: TURP Thursday 11/15/11  Consults: None  Significant Diagnostic Studies:  Treatments: TURP  Discharge Exam: Blood pressure 149/81, pulse 61, temperature 97.5 F (36.4 C), temperature source Oral, resp. rate 16, height 6\' 2"  (1.88 m), weight 104 kg (229 lb 4.5 oz), SpO2 100.00%. General appearance: alert and cooperative  Disposition: 01-Home or Self Care  Discharge Orders    Future Orders Please Complete By Expires   Diet - low sodium heart healthy      Increase activity slowly      Discharge instructions      Comments:   Post transurethral resection of the prostate (TURP) instructions  Your recent prostate surgery requires very special post hospital care. Despite the fact that no skin incisions were used the area around the prostate incision is quite raw and is covered with a scab to promote healing and prevent bleeding. Certain cautions are needed to assure that the scab is not disturbed of the next 2-3 weeks while the healing proceeds.  Because the raw surface in your prostate and the irritating effects of urine you may expect frequency of urination and/or urgency (a stronger desire to urinate) and perhaps even getting up at night more often. This will usually resolve or improve slowly over the healing period. You may see some blood in your urine over the first 6 weeks. Do not be alarmed, even if the urine was clear for a while. Get off your feet and drink lots of fluids until clearing occurs. If you start to pass clots or don't improve call us.  Diet:  You may return to your normal diet immediately. Because of the raw surface of your bladder, alcohol, spicy foods, foods high in  acid and drinks with caffeine may cause irritation or frequency and should be used in moderation. To keep your urine flowing freely and avoid constipation, drink plenty of fluids during the day (8-10 glasses). Tip: Avoid cranberry juice because it is very acidic.  Activity:  Your physical activity doesn't need to be restricted. However, if you are very active, you may see some blood in the urine. We suggest that you reduce your activity under the circumstances until the bleeding has stopped.  Bowels:  It is important to keep your bowels regular during the postoperative period. Straining with bowel movements can cause bleeding. A bowel movement every other day is reasonable. Use a mild laxative if needed, such as milk of magnesia 2-3 tablespoons, or 2 Dulcolax tablets. Call if you continue to have problems. If you had been taking narcotics for pain, before, during or after your surgery, you may be constipated. Take a laxative if necessary.  Medication:  You should resume your pre-surgery medications unless told not to. In addition you may be given an antibiotic to prevent or treat infection. Antibiotics are not always necessary. All medication should be taken as prescribed until the bottles are finished unless you are having an unusual reaction to one of the drugs.     Problems you should report to [place]:  a. Fever greater than 101F. b. Heavy bleeding, or clots (see notes above about blood in urine). c. Inability to urinate. d. Drug reactions (hives, rash, nausea, vomiting, diarrhea). e. Severe burning or pain  with urination that is not improving.   Other Restrictions      Comments:   Hold aspirin for 6 weeks   Discontinue IV        Medication List  As of 11/16/2011  8:58 AM   STOP taking these medications         aspirin 325 MG tablet      RAPAFLO 8 MG Caps capsule         TAKE these medications         ADVAIR DISKUS 500-50 MCG/DOSE Aepb   Generic drug: Fluticasone-Salmeterol     Inhale 1 puff into the lungs every 12 (twelve) hours.      atorvastatin 20 MG tablet   Commonly known as: LIPITOR   Take 20 mg by mouth daily with breakfast.      ciprofloxacin 250 MG tablet   Commonly known as: CIPRO   Take 1 tablet (250 mg total) by mouth 2 (two) times daily.      COMBIVENT 18-103 MCG/ACT inhaler   Generic drug: albuterol-ipratropium   Inhale 2 puffs into the lungs 2 (two) times daily.      Fish Oil 1000 MG Caps   Take 1 capsule by mouth daily.      fluticasone 50 MCG/ACT nasal spray   Commonly known as: FLONASE   2 sprays in each nostril twice daily      furosemide 40 MG tablet   Commonly known as: LASIX   Take 40 mg by mouth daily with breakfast.      gabapentin 300 MG capsule   Commonly known as: NEURONTIN   Take 300 mg by mouth 3 (three) times daily.      guaiFENesin 600 MG 12 hr tablet   Commonly known as: MUCINEX   Take 600 mg by mouth daily with breakfast.      HYDROcodone-acetaminophen 5-325 MG per tablet   Commonly known as: NORCO   Take 1-2 tablets by mouth every 4 (four) hours as needed.      hyoscyamine 0.125 MG SL tablet   Commonly known as: LEVSIN SL   Take 1 tablet (0.125 mg total) by mouth every 4 (four) hours as needed.      loratadine 10 MG tablet   Commonly known as: CLARITIN   Take 10 mg by mouth daily with breakfast.      losartan 50 MG tablet   Commonly known as: COZAAR   Take 25 mg by mouth every other day.      metoprolol tartrate 25 MG tablet   Commonly known as: LOPRESSOR   Take 25 mg by mouth 2 (two) times daily.      predniSONE 5 MG tablet   Commonly known as: DELTASONE   Take 5 mg by mouth daily with breakfast.      ranitidine 150 MG capsule   Commonly known as: ZANTAC   Take 150 mg by mouth 2 (two) times daily.      SLOW-MAG 535 (64 MG) MG Tbcr   Generic drug: Magnesium Chloride   Take 1 tablet by mouth 2 (two) times daily.      tiotropium 18 MCG inhalation capsule   Commonly known as: SPIRIVA    Place 18 mcg into inhaler and inhale daily with breakfast.      Vitamin D (Ergocalciferol) 50000 UNITS Caps   Commonly known as: DRISDOL   Take 50,000 Units by mouth every 7 (seven) days. Take on Fridays      vitamin E  1000 UNIT capsule   Take 1,000 Units by mouth daily with breakfast.         ASK your doctor about these medications         potassium chloride 10 MEQ tablet   Commonly known as: K-DUR   Take 10 mEq by mouth 2 (two) times daily.           Follow-up Information    Follow up with Marthella Osorno I, MD. Call in 3 days. (Call Kim Monday AM to comje in to office for catheter removal)    Contact information:   [address] [place], [address] [facility] [place] [address] [phone]        A:   Urine clear with hx some clots last night.  \P: leave foley for weekend and RTC Monday AM for cath removal.  Signed: Patsi Sears, [place] I 11/16/2011, 8:58 AM

## 2011-11-16 NOTE — Progress Notes (Signed)
Pt given clean drainage bag, leg bag and urinal. Instructed on how to changed bags and empty into urinal. Pt taught to clean around catheter and apply neosporin as needed. TURP after care education material given and reviewed. Pt will be driving himself home. Encouraged pt to call for ride but he is adamant about driving home. Advised on safety concerns. Leg bag was put on to allow for driving. [name] son is aware that pt is going to be driving home. Pt has received no narcotic medications. Julio Sicks RN

## 2012-05-16 ENCOUNTER — Encounter (HOSPITAL_COMMUNITY): Payer: Self-pay | Admitting: *Deleted

## 2012-05-16 ENCOUNTER — Emergency Department (HOSPITAL_COMMUNITY): Payer: Medicare Other

## 2012-05-16 ENCOUNTER — Inpatient Hospital Stay (HOSPITAL_COMMUNITY)
Admission: EM | Admit: 2012-05-16 | Discharge: 2012-05-18 | DRG: 194 | Disposition: A | Discharge status: Home / Self Care | Payer: Medicare Other | Attending: Family Medicine | Admitting: Family Medicine

## 2012-05-16 DIAGNOSIS — I255 Ischemic cardiomyopathy: Secondary | ICD-10-CM | POA: Diagnosis present

## 2012-05-16 DIAGNOSIS — J189 Pneumonia, unspecified organism: Principal | ICD-10-CM | POA: Diagnosis present

## 2012-05-16 DIAGNOSIS — N189 Chronic kidney disease, unspecified: Secondary | ICD-10-CM | POA: Diagnosis present

## 2012-05-16 DIAGNOSIS — I2589 Other forms of chronic ischemic heart disease: Secondary | ICD-10-CM | POA: Diagnosis present

## 2012-05-16 DIAGNOSIS — J4489 Other specified chronic obstructive pulmonary disease: Secondary | ICD-10-CM | POA: Diagnosis present

## 2012-05-16 DIAGNOSIS — Z79899 Other long term (current) drug therapy: Secondary | ICD-10-CM

## 2012-05-16 DIAGNOSIS — Z951 Presence of aortocoronary bypass graft: Secondary | ICD-10-CM

## 2012-05-16 DIAGNOSIS — I739 Peripheral vascular disease, unspecified: Secondary | ICD-10-CM | POA: Diagnosis present

## 2012-05-16 DIAGNOSIS — R55 Syncope and collapse: Secondary | ICD-10-CM

## 2012-05-16 DIAGNOSIS — I2510 Atherosclerotic heart disease of native coronary artery without angina pectoris: Secondary | ICD-10-CM | POA: Diagnosis present

## 2012-05-16 DIAGNOSIS — I509 Heart failure, unspecified: Secondary | ICD-10-CM | POA: Diagnosis present

## 2012-05-16 DIAGNOSIS — I25810 Atherosclerosis of coronary artery bypass graft(s) without angina pectoris: Secondary | ICD-10-CM

## 2012-05-16 DIAGNOSIS — R531 Weakness: Secondary | ICD-10-CM

## 2012-05-16 DIAGNOSIS — I10 Essential (primary) hypertension: Secondary | ICD-10-CM

## 2012-05-16 DIAGNOSIS — I5022 Chronic systolic (congestive) heart failure: Secondary | ICD-10-CM | POA: Diagnosis present

## 2012-05-16 DIAGNOSIS — I472 Ventricular tachycardia: Secondary | ICD-10-CM

## 2012-05-16 DIAGNOSIS — I219 Acute myocardial infarction, unspecified: Secondary | ICD-10-CM

## 2012-05-16 DIAGNOSIS — R600 Localized edema: Secondary | ICD-10-CM

## 2012-05-16 DIAGNOSIS — K2270 Barrett's esophagus without dysplasia: Secondary | ICD-10-CM

## 2012-05-16 DIAGNOSIS — K219 Gastro-esophageal reflux disease without esophagitis: Secondary | ICD-10-CM

## 2012-05-16 DIAGNOSIS — Z950 Presence of cardiac pacemaker: Secondary | ICD-10-CM

## 2012-05-16 DIAGNOSIS — I959 Hypotension, unspecified: Secondary | ICD-10-CM | POA: Diagnosis present

## 2012-05-16 DIAGNOSIS — I129 Hypertensive chronic kidney disease with stage 1 through stage 4 chronic kidney disease, or unspecified chronic kidney disease: Secondary | ICD-10-CM | POA: Diagnosis present

## 2012-05-16 DIAGNOSIS — J449 Chronic obstructive pulmonary disease, unspecified: Secondary | ICD-10-CM

## 2012-05-16 DIAGNOSIS — E7800 Pure hypercholesterolemia, unspecified: Secondary | ICD-10-CM

## 2012-05-16 DIAGNOSIS — N179 Acute kidney failure, unspecified: Secondary | ICD-10-CM | POA: Diagnosis present

## 2012-05-16 DIAGNOSIS — E785 Hyperlipidemia, unspecified: Secondary | ICD-10-CM | POA: Diagnosis present

## 2012-05-16 DIAGNOSIS — D72829 Elevated white blood cell count, unspecified: Secondary | ICD-10-CM | POA: Diagnosis present

## 2012-05-16 DIAGNOSIS — E860 Dehydration: Secondary | ICD-10-CM

## 2012-05-16 DIAGNOSIS — I252 Old myocardial infarction: Secondary | ICD-10-CM

## 2012-05-16 DIAGNOSIS — G4733 Obstructive sleep apnea (adult) (pediatric): Secondary | ICD-10-CM | POA: Diagnosis present

## 2012-05-16 DIAGNOSIS — N390 Urinary tract infection, site not specified: Secondary | ICD-10-CM

## 2012-05-16 LAB — COMPREHENSIVE METABOLIC PANEL
ALT: 10 U/L (ref 0–53)
AST: 12 U/L (ref 0–37)
Albumin: 3 g/dL — ABNORMAL LOW (ref 3.5–5.2)
Calcium: 8.7 mg/dL (ref 8.4–10.5)
Creatinine, Ser: 1.64 mg/dL — ABNORMAL HIGH (ref 0.50–1.35)
Sodium: 133 mEq/L — ABNORMAL LOW (ref 135–145)
Total Protein: 5.8 g/dL — ABNORMAL LOW (ref 6.0–8.3)

## 2012-05-16 LAB — CBC WITH DIFFERENTIAL/PLATELET
Basophils Absolute: 0 10*3/uL (ref 0.0–0.1)
Basophils Relative: 0 % (ref 0–1)
Eosinophils Relative: 0 % (ref 0–5)
HCT: 41.1 % (ref 39.0–52.0)
MCHC: 33.6 g/dL (ref 30.0–36.0)
MCV: 88.4 fL (ref 78.0–100.0)
Monocytes Absolute: 1.5 10*3/uL — ABNORMAL HIGH (ref 0.1–1.0)
Platelets: 161 10*3/uL (ref 150–400)
RDW: 16 % — ABNORMAL HIGH (ref 11.5–15.5)
WBC: 16.1 10*3/uL — ABNORMAL HIGH (ref 4.0–10.5)

## 2012-05-16 LAB — LACTIC ACID, PLASMA: Lactic Acid, Venous: 2.4 mmol/L — ABNORMAL HIGH (ref 0.5–2.2)

## 2012-05-16 LAB — PRO B NATRIURETIC PEPTIDE: Pro B Natriuretic peptide (BNP): 2313 pg/mL — ABNORMAL HIGH (ref 0–450)

## 2012-05-16 MED ORDER — FLUTICASONE PROPIONATE 50 MCG/ACT NA SUSP
2.0000 | Freq: Two times a day (BID) | NASAL | Status: DC | PRN
  Filled 2012-05-16: qty 16

## 2012-05-16 MED ORDER — IPRATROPIUM-ALBUTEROL 20-100 MCG/ACT IN AERS
1.0000 | INHALATION_SPRAY | Freq: Two times a day (BID) | RESPIRATORY_TRACT | Status: DC
  Administered 2012-05-17 – 2012-05-18 (×2): 1 via RESPIRATORY_TRACT
  Filled 2012-05-16 (×2): qty 4

## 2012-05-16 MED ORDER — ACETAMINOPHEN 325 MG PO TABS: 650.0000 mg | ORAL_TABLET | Freq: Four times a day (QID) | ORAL | Status: DC | PRN

## 2012-05-16 MED ORDER — DEXTROSE 5 % IV SOLN
500.0000 mg | INTRAVENOUS | Status: DC
  Administered 2012-05-17: 500 mg via INTRAVENOUS
  Filled 2012-05-16 (×2): qty 500

## 2012-05-16 MED ORDER — ALBUTEROL SULFATE (5 MG/ML) 0.5% IN NEBU: 2.5000 mg | INHALATION_SOLUTION | RESPIRATORY_TRACT | Status: DC | PRN

## 2012-05-16 MED ORDER — ENOXAPARIN SODIUM 30 MG/0.3ML ~~LOC~~ SOLN
30.0000 mg | SUBCUTANEOUS | Status: DC
  Administered 2012-05-16: 30 mg via SUBCUTANEOUS
  Filled 2012-05-16 (×2): qty 0.3

## 2012-05-16 MED ORDER — ONDANSETRON HCL 4 MG PO TABS: 4.0000 mg | ORAL_TABLET | Freq: Four times a day (QID) | ORAL | Status: DC | PRN

## 2012-05-16 MED ORDER — SODIUM CHLORIDE 0.9 % IV BOLUS (SEPSIS)
500.0000 mL | Freq: Once | INTRAVENOUS | Status: AC
  Administered 2012-05-16: 500 mL via INTRAVENOUS

## 2012-05-16 MED ORDER — ACETAMINOPHEN 650 MG RE SUPP: 650.0000 mg | Freq: Four times a day (QID) | RECTAL | Status: DC | PRN

## 2012-05-16 MED ORDER — MOMETASONE FURO-FORMOTEROL FUM 200-5 MCG/ACT IN AERO
2.0000 | INHALATION_SPRAY | Freq: Two times a day (BID) | RESPIRATORY_TRACT | Status: DC
  Administered 2012-05-16 – 2012-05-18 (×4): 2 via RESPIRATORY_TRACT
  Filled 2012-05-16: qty 8.8

## 2012-05-16 MED ORDER — HYDROCORTISONE SOD SUCCINATE 100 MG IJ SOLR
50.0000 mg | Freq: Three times a day (TID) | INTRAMUSCULAR | Status: DC
  Administered 2012-05-17 (×2): 50 mg via INTRAVENOUS
  Filled 2012-05-16 (×4): qty 1

## 2012-05-16 MED ORDER — CEFTRIAXONE SODIUM 1 G IJ SOLR
1.0000 g | Freq: Once | INTRAMUSCULAR | Status: AC
  Administered 2012-05-16: 1 g via INTRAVENOUS
  Filled 2012-05-16: qty 10

## 2012-05-16 MED ORDER — DEXTROSE 5 % IV SOLN
1.0000 g | INTRAVENOUS | Status: DC
  Administered 2012-05-17: 1 g via INTRAVENOUS
  Filled 2012-05-16 (×2): qty 10

## 2012-05-16 MED ORDER — SODIUM CHLORIDE 0.9 % IJ SOLN
3.0000 mL | Freq: Two times a day (BID) | INTRAMUSCULAR | Status: DC
  Administered 2012-05-16 – 2012-05-18 (×4): 3 mL via INTRAVENOUS

## 2012-05-16 MED ORDER — MAGNESIUM CHLORIDE ER 535 (64 MG) MG PO TBCR: 1.0000 | EXTENDED_RELEASE_TABLET | Freq: Two times a day (BID) | ORAL | Status: DC

## 2012-05-16 MED ORDER — DEXTROSE 5 % IV SOLN
500.0000 mg | Freq: Once | INTRAVENOUS | Status: AC
  Administered 2012-05-16: 500 mg via INTRAVENOUS
  Filled 2012-05-16: qty 500

## 2012-05-16 MED ORDER — GUAIFENESIN ER 600 MG PO TB12
600.0000 mg | ORAL_TABLET | Freq: Every day | ORAL | Status: DC
  Administered 2012-05-17 – 2012-05-18 (×2): 600 mg via ORAL
  Filled 2012-05-16 (×2): qty 1

## 2012-05-16 MED ORDER — SODIUM CHLORIDE 0.9 % IV SOLN
INTRAVENOUS | Status: AC
  Administered 2012-05-16: 18:00:00 via INTRAVENOUS

## 2012-05-16 MED ORDER — MAGNESIUM CHLORIDE 64 MG PO TBEC
1.0000 | DELAYED_RELEASE_TABLET | Freq: Two times a day (BID) | ORAL | Status: DC
  Administered 2012-05-16 – 2012-05-18 (×4): 64 mg via ORAL
  Filled 2012-05-16 (×5): qty 1

## 2012-05-16 MED ORDER — ONDANSETRON HCL 4 MG/2ML IJ SOLN: 4.0000 mg | Freq: Four times a day (QID) | INTRAMUSCULAR | Status: DC | PRN

## 2012-05-16 MED ORDER — TIOTROPIUM BROMIDE MONOHYDRATE 18 MCG IN CAPS
18.0000 ug | ORAL_CAPSULE | Freq: Every day | RESPIRATORY_TRACT | Status: DC
  Administered 2012-05-17 – 2012-05-18 (×2): 18 ug via RESPIRATORY_TRACT
  Filled 2012-05-16: qty 5

## 2012-05-16 MED ORDER — GABAPENTIN 300 MG PO CAPS
600.0000 mg | ORAL_CAPSULE | Freq: Three times a day (TID) | ORAL | Status: DC
  Administered 2012-05-16 – 2012-05-18 (×5): 600 mg via ORAL
  Filled 2012-05-16 (×7): qty 2

## 2012-05-16 MED ORDER — HYDROCORTISONE SOD SUCCINATE 100 MG IJ SOLR
100.0000 mg | Freq: Once | INTRAMUSCULAR | Status: AC
  Administered 2012-05-16: 100 mg via INTRAVENOUS

## 2012-05-16 MED ORDER — ATORVASTATIN CALCIUM 20 MG PO TABS
20.0000 mg | ORAL_TABLET | Freq: Every day | ORAL | Status: DC
  Administered 2012-05-17: 20 mg via ORAL
  Filled 2012-05-16 (×2): qty 1

## 2012-05-16 MED ORDER — LORATADINE 10 MG PO TABS
10.0000 mg | ORAL_TABLET | Freq: Every day | ORAL | Status: DC
  Administered 2012-05-17 – 2012-05-18 (×2): 10 mg via ORAL
  Filled 2012-05-16 (×2): qty 1

## 2012-05-16 MED ORDER — FAMOTIDINE 20 MG PO TABS
20.0000 mg | ORAL_TABLET | Freq: Two times a day (BID) | ORAL | Status: DC
  Administered 2012-05-16 – 2012-05-18 (×4): 20 mg via ORAL
  Filled 2012-05-16 (×5): qty 1

## 2012-05-16 MED ORDER — IPRATROPIUM-ALBUTEROL 18-103 MCG/ACT IN AERO: 2.0000 | INHALATION_SPRAY | Freq: Two times a day (BID) | RESPIRATORY_TRACT | Status: DC

## 2012-05-16 MED ORDER — OMEGA-3-ACID ETHYL ESTERS 1 G PO CAPS
1.0000 g | ORAL_CAPSULE | Freq: Every day | ORAL | Status: DC
  Administered 2012-05-17 – 2012-05-18 (×2): 1 g via ORAL
  Filled 2012-05-16 (×2): qty 1

## 2012-05-16 NOTE — ED Notes (Signed)
MD at bedside. 

## 2012-05-16 NOTE — ED Notes (Signed)
Pt aware of the need for a urine sample, urinal at bedside. 

## 2012-05-16 NOTE — ED Notes (Signed)
Bed:WA18<BR> Expected date:<BR> Expected time:<BR> Means of arrival:<BR> Comments:<BR> ems

## 2012-05-16 NOTE — ED Notes (Signed)
Per EMS, pt from home, reports dizziness and weakness since last night.  Became severe this am.  Pt also reports seeing "black spots" when he is dizzy.  Pt is A&Ox 4.  Denies any pain at this time.

## 2012-05-16 NOTE — H&P (Signed)
[facility] History and Physical  MAN EFFERTZ ZOX:[id number] DOB: 07-25-1926 DOA: 05/16/2012  Referring physician: Dr. Chaney Malling PCP: Dorcas Carrow, MD  Specialists: None  Chief Complaint: Generalized weakness and dizziness.  HPI: Richard Chavez is a 79 y.o. male with history of HTN, HL, COPD, CAD status post CABG-(patent grafts by cath in January 2013),ischemic cardiomyopathy (EF 30-35%), pacemaker, sleep apnea, GERD, chronic kidney disease, presented to the ED on 05/16/12 with complaints of generalized weakness and dizziness. Patient and his son Mr. Richard Chavez of providing history. Patient was in his usual state of health until last night. He was sitting on a rocking chair and started feeling weak and dizzy. He had some chills but no fever. He denies any other symptoms. He informed his son and retired for the night. This morning he continued to experience same symptoms and decided to come to the ED. In the ED his initial blood pressure was 91/48 mmHg, white blood cell count of 16,000 and chest x-ray suggesting right upper lobe pneumonia. Patient denies cough, dyspnea, chest pain, sore throat, headache, nausea, vomiting, abdominal pain, diarrhea, urinary symptoms or skin rash. No sickly contacts. He was bolused with IV fluids. He indicates that his dizziness has resolved. The hospitalist service is requested to admit for further evaluation and management   Review of Systems: All systems reviewed and apart from history of presenting illness, negative. Patient has taken a flu shot this season.  Past Medical History  Diagnosis Date  . CAD (coronary artery disease)   . Hypertension   . Hyperlipidemia   . GERD (gastroesophageal reflux disease)   . COPD (chronic obstructive pulmonary disease)   . CRF (chronic renal failure)   . Barrett's esophagus   . OP (osteoporosis)   . CAD (coronary artery disease) of artery bypass graft 07/04/2011    LIMA-LAD; SVG-OM2-OM3, SVG-RCA -- all patent as of  05/27/11  . Syncope 07/04/2011  . Pacemaker 07/04/2011  . Cardiomyopathy, ischemic 07/04/2011  . Myocardial infarction     25 yrs ago  . Peripheral vascular disease   . Arthritis   . BPH (benign prostatic hyperplasia)   . Polyp of colon     removed  . Sleep apnea     STOP BANG SCORE 4   Past Surgical History  Procedure Date  . Pacemaker placement 2007  . Cholecystectomy 2005  . Coronary artery bypass graft 1978  . Lesion excision     from penis  . Cardiac catheterization     "several times"   Social History:  reports that he quit smoking about 26 years ago. His smoking use included Cigarettes. He has a 4.5 pack-year smoking history. He has never used smokeless tobacco. He reports that he does not drink alcohol or use illicit drugs. Widowed. His son Mr. Richard Chavez lives with him. Independent of activities of daily living.  No Known Allergies  Family History  Problem Relation Age of Onset  . Leukemia Father   . Heart disease Father   . Alzheimer's disease Mother   . Arthritis Mother   . Lung cancer Son 72    Prior to Admission medications   Medication Sig Start Date End Date Taking? Authorizing Provider  albuterol-ipratropium (COMBIVENT) 18-103 MCG/ACT inhaler Inhale 2 puffs into the lungs 2 (two) times daily.    Yes Historical Provider, MD  atorvastatin (LIPITOR) 20 MG tablet Take 20 mg by mouth daily with breakfast.    Yes Historical Provider, MD  fluticasone (FLONASE) 50 MCG/ACT  nasal spray Place 2 sprays into the nose 2 (two) times daily as needed. For dryness. 03/09/11  Yes Historical Provider, MD  Fluticasone-Salmeterol (ADVAIR DISKUS) 500-50 MCG/DOSE AEPB Inhale 1 puff into the lungs every 12 (twelve) hours.    Yes Historical Provider, MD  furosemide (LASIX) 40 MG tablet Take 40 mg by mouth daily with breakfast. 07/24/11  Yes Vassie Loll, MD  gabapentin (NEURONTIN) 300 MG capsule Take 600 mg by mouth 3 (three) times daily.  02/07/11  Yes Historical Provider, MD    guaiFENesin (MUCINEX) 600 MG 12 hr tablet Take 600 mg by mouth daily with breakfast.    Yes Historical Provider, MD  loratadine (CLARITIN) 10 MG tablet Take 10 mg by mouth daily with breakfast.    Yes Historical Provider, MD  losartan (COZAAR) 50 MG tablet Take 25 mg by mouth every other day. 07/24/11  Yes Vassie Loll, MD  Magnesium Chloride (SLOW-MAG) 535 (64 MG) MG TBCR Take 1 tablet by mouth 2 (two) times daily.    Yes Historical Provider, MD  metoprolol tartrate (LOPRESSOR) 25 MG tablet Take 37.5 mg by mouth 2 (two) times daily.  07/24/11  Yes Vassie Loll, MD  Omega-3 Fatty Acids (FISH OIL) 1000 MG CAPS Take 1 capsule by mouth daily.    Yes Historical Provider, MD  potassium chloride (K-DUR) 10 MEQ tablet Take 10 mEq by mouth 2 (two) times daily.    Yes Historical Provider, MD  predniSONE (DELTASONE) 5 MG tablet Take 5 mg by mouth daily with breakfast.    Yes Historical Provider, MD  ranitidine (ZANTAC) 150 MG capsule Take 150 mg by mouth 2 (two) times daily.    Yes Historical Provider, MD  tiotropium (SPIRIVA) 18 MCG inhalation capsule Place 18 mcg into inhaler and inhale daily with breakfast.    Yes Historical Provider, MD  Vitamin D, Ergocalciferol, (DRISDOL) 50000 UNITS CAPS Take 50,000 Units by mouth every 7 (seven) days. Take on Fridays   Yes Historical Provider, MD  vitamin E 1000 UNIT capsule Take 1,000 Units by mouth daily with breakfast.    Yes Historical Provider, MD   Physical Exam: Filed Vitals:   05/16/12 1350 05/16/12 1352 05/16/12 1353 05/16/12 1612  BP: 98/41 98/43 104/48 93/51  Pulse: 88 100 80   Temp:      TempSrc:      Resp:      SpO2:         General exam: Moderately built and nourished male patient, lying comfortably supine on the bed without any distress.  Head, eyes and ENT: Nontraumatic and normocephalic. Pupils equally reacting to light and accommodation. Bilateral intraocular lens. Oral mucosa moist. Edentulous.  Neck: Supple. No JVD, carotid bruit or  thyromegaly.  Lymphatics: No lymphadenopathy.  Respiratory system: Few right upper lobe crackles anteriorly but otherwise clear to auscultation. No increased work of breathing.  Cardiovascular system: First and second heart sounds heard, regular rate and rhythm. No JVD, murmurs or gallops. Bilateral 1+ pitting leg edema.  Gastrointestinal system: Abdomen is nondistended, soft and nontender. Normal bowel sounds heard. No organomegaly or mass appreciated.  Central nervous system: Alert and oriented. No focal neurological deficits.  Extremities: Symmetric 5 x 5 power. Peripheral pulses symmetrically felt.  Skin: No acute findings.  Musculoskeletal system: Negative exam.  Psychiatry: Pleasant and cooperative.  Labs on Admission:  Basic Metabolic Panel:  Lab 05/16/12 1610  NA 133*  K 4.2  CL 101  CO2 22  GLUCOSE 92  BUN 21  CREATININE 1.64*  CALCIUM 8.7  MG --  PHOS --   Liver Function Tests:  Lab 05/16/12 1305  AST 12  ALT 10  ALKPHOS 56  BILITOT 1.2  PROT 5.8*  ALBUMIN 3.0*   No results found for this basename: LIPASE:5,AMYLASE:5 in the last 168 hours No results found for this basename: AMMONIA:5 in the last 168 hours CBC:  Lab 05/16/12 1305  WBC 16.1*  NEUTROABS 13.1*  HGB 13.8  HCT 41.1  MCV 88.4  PLT 161   Cardiac Enzymes:  Lab 05/16/12 1305  CKTOTAL --  CKMB --  CKMBINDEX --  TROPONINI <0.30    BNP (last 3 results)  Basename 05/16/12 1305 07/04/11 1826  PROBNP 2313.0* 3662.0*   CBG: No results found for this basename: GLUCAP:5 in the last 168 hours  Radiological Exams on Admission: Dg Chest 2 View  05/16/2012  *RADIOLOGY REPORT*  Clinical Data: Former smoker presenting with dizziness and weakness.  Prior CABG.  CHEST - 2 VIEW  Comparison: Two-view chest x-ray 11/01/2011, 07/04/2011, 04/26/2011.  Findings: Prior sternotomy for CABG.  Cardiac silhouette enlarged but stable.  Right subclavian dual lead transvenous pacemaker with the lead tips  projected over the expected location of the right atrial appendage and right ventricular apex, unchanged.  Airspace consolidation in the right upper lobe.  Lungs otherwise clear. Pulmonary vascularity normal.  Small right pleural effusion suspected.  Degenerative changes involving the thoracic spine.  IMPRESSION: Right upper lobe pneumonia.  Stable cardiomegaly without pulmonary edema.  Small right pleural effusion.   Original Report Authenticated By: Hulan Saas, M.D.    Ct Head Wo Contrast  05/16/2012  *RADIOLOGY REPORT*  Clinical Data: Dizziness  CT HEAD WITHOUT CONTRAST  Technique:  Contiguous axial images were obtained from the base of the skull through the vertex without contrast.  Comparison: 07/04/2011  Findings: There is prominence of the sulci and ventricles consistent with brain atrophy. There is diffuse patchy low density throughout the subcortical and periventricular white matter consistent with chronic small vessel ischemic change.  There is no evidence for acute brain infarct, hemorrhage or mass.  The paranasal sinuses and mastoid air cells are clear.  The skull is intact.  IMPRESSION: No acute intracranial abnormalities.   Original Report Authenticated By: Signa Kell, M.D.     EKG: Independently reviewed. Atrial paced rhythm, bigeminy. No acute findings  Assessment/Plan Principal Problem:  *PNA (pneumonia) Active Problems:  Hypertension  Hypercholesteremia  COPD (chronic obstructive pulmonary disease)  GERD (gastroesophageal reflux disease)  CAD CABG 1998, patent grafts 06/06/11 after abn Myoview  Pacemaker, [employer] Jude, implanted 2008  Cardiomyopathy, ischemic, EF 30-35% by recent cath  Hypotension  Leukocytosis  Renal failure (ARF), acute on chronic  Dehydration  Right upper lobe pneumonia/community acquired pneumonia Will admit to telemetry bed given cardiac history. Check urine Legionella and streptococcal antigen. Blood cultures if he spikes a temperature. Continue IV  Rocephin and azithromycin.  Hypotension Likely secondary to dehydration from pneumonia and antihypertensive medications. Hold metoprolol, ARB and diuretics. Briefly gently hydrate and monitor. We'll provide stress dose IV hydrocortisone. Monitor closely.  Acute on chronic kidney disease Secondary to dehydration, hypotension and ARB. Briefly hydrate and follow BMP.  COPD/OSA Stable. Continue bronchodilators.  GERD Continue Pepcid.  CAD status post CABG/pacemaker/ischemic cardiomyopathy/chronic systolic CHF Asymptomatic of chest pain and no acute EKG changes. Currently clinically dehydrated. Hold off on diuretics and antihypertensive medications. These may be resumed as soon as blood pressure stabilizes.  Hypertension     Code Status: Full Family Communication:  Discussed with patient's son Mr. Christobal Morado at bedside. Disposition Plan: At least 2 midnight's. Home when medically stable.  Time spent: 55 minutes  [facility] Pager [phone]  If 7PM-7AM, please contact night-coverage [email or web address] Password [facility] 05/16/2012, 5:33 PM

## 2012-05-16 NOTE — ED Provider Notes (Signed)
History     CSN: [id number]  Arrival date & time 05/16/12  1224   First MD Initiated Contact with Patient 05/16/12 1245      Chief Complaint  Patient presents with  . Dizziness    (Consider location/radiation/quality/duration/timing/severity/associated sxs/prior treatment) The history is provided by the patient.  Richard Chavez is a 79 y.o. male history of CAD status post CABG, heart failure status post pacemaker, hyperlipidemia, COPD, chronic renal failure here presenting with weakness and dizziness. He is chronically weak and dizzy yesterday he got worse. Saw some black spots and felt like passing out but did not pass out. He felt weak in bilateral legs. Denies any falls. He has chronic leg swelling that is not getting worse. No shortness of breath or chest pain. Had similar episode last March and was found to be hyponatremic with acute renal failure.   Past Medical History  Diagnosis Date  . CAD (coronary artery disease)   . Hypertension   . Hyperlipidemia   . GERD (gastroesophageal reflux disease)   . COPD (chronic obstructive pulmonary disease)   . CRF (chronic renal failure)   . Barrett's esophagus   . OP (osteoporosis)   . CAD (coronary artery disease) of artery bypass graft 07/04/2011    LIMA-LAD; SVG-OM2-OM3, SVG-RCA -- all patent as of 05/27/11  . Syncope 07/04/2011  . Pacemaker 07/04/2011  . Cardiomyopathy, ischemic 07/04/2011  . Myocardial infarction     25 yrs ago  . Peripheral vascular disease   . Arthritis   . BPH (benign prostatic hyperplasia)   . Polyp of colon     removed  . Sleep apnea     STOP BANG SCORE 4    Past Surgical History  Procedure Date  . Pacemaker placement 2007  . Cholecystectomy 2005  . Coronary artery bypass graft 1978  . Lesion excision     from penis  . Cardiac catheterization     "several times"    Family History  Problem Relation Age of Onset  . Leukemia Father   . Alzheimer's disease Mother   . Lung cancer Son 95  . Heart  disease Father   . Arthritis Mother     History  Substance Use Topics  . Smoking status: Former Smoker -- 0.3 packs/day for 15 years    Types: Cigarettes    Quit date: 05/07/1986  . Smokeless tobacco: Not on file  . Alcohol Use: No      Review of Systems  Neurological: Positive for dizziness and weakness.  All other systems reviewed and are negative.    Allergies  Review of patient's allergies indicates no known allergies.  Home Medications   Current Outpatient Rx  Name  Route  Sig  Dispense  Refill  . IPRATROPIUM-ALBUTEROL 18-103 MCG/ACT IN AERO   Inhalation   Inhale 2 puffs into the lungs 2 (two) times daily.          . ATORVASTATIN CALCIUM 20 MG PO TABS   Oral   Take 20 mg by mouth daily with breakfast.          . FLUTICASONE PROPIONATE 50 MCG/ACT NA SUSP   Nasal   Place 2 sprays into the nose 2 (two) times daily as needed. For dryness.         Marland Kitchen FLUTICASONE-SALMETEROL 500-50 MCG/DOSE IN AEPB   Inhalation   Inhale 1 puff into the lungs every 12 (twelve) hours.          . FUROSEMIDE 40 MG  PO TABS   Oral   Take 40 mg by mouth daily with breakfast.         . GABAPENTIN 300 MG PO CAPS   Oral   Take 600 mg by mouth 3 (three) times daily.          . GUAIFENESIN ER 600 MG PO TB12   Oral   Take 600 mg by mouth daily with breakfast.          . LORATADINE 10 MG PO TABS   Oral   Take 10 mg by mouth daily with breakfast.          . LOSARTAN POTASSIUM 50 MG PO TABS   Oral   Take 25 mg by mouth every other day.         Marland Kitchen MAGNESIUM CHLORIDE ER 535 (64 MG) MG PO TBCR   Oral   Take 1 tablet by mouth 2 (two) times daily.          Marland Kitchen METOPROLOL TARTRATE 25 MG PO TABS   Oral   Take 37.5 mg by mouth 2 (two) times daily.          Marland Kitchen FISH OIL 1000 MG PO CAPS   Oral   Take 1 capsule by mouth daily.          Marland Kitchen POTASSIUM CHLORIDE ER 10 MEQ PO TBCR   Oral   Take 10 mEq by mouth 2 (two) times daily.          Marland Kitchen PREDNISONE 5 MG PO TABS    Oral   Take 5 mg by mouth daily with breakfast.          . RANITIDINE HCL 150 MG PO CAPS   Oral   Take 150 mg by mouth 2 (two) times daily.          Marland Kitchen TIOTROPIUM BROMIDE MONOHYDRATE 18 MCG IN CAPS   Inhalation   Place 18 mcg into inhaler and inhale daily with breakfast.          . VITAMIN D (ERGOCALCIFEROL) 50000 UNITS PO CAPS   Oral   Take 50,000 Units by mouth every 7 (seven) days. Take on Fridays         . VITAMIN E 1000 UNITS PO CAPS   Oral   Take 1,000 Units by mouth daily with breakfast.            BP 104/48  Pulse 80  Temp 97.8 F (36.6 C) (Oral)  Resp 18  SpO2 95%  Physical Exam  Nursing note and vitals reviewed. Constitutional: He is oriented to person, place, and time.       Chronically ill  HENT:  Head: Normocephalic.  Mouth/Throat: Oropharynx is clear and moist.  Eyes: Conjunctivae normal are normal. Pupils are equal, round, and reactive to light.  Neck: Normal range of motion. Neck supple.  Cardiovascular: Normal rate, regular rhythm and normal heart sounds.   Pulmonary/Chest: Effort normal and breath sounds normal. No respiratory distress. He has no wheezes. He has no rales.  Abdominal: Soft. Bowel sounds are normal. He exhibits no distension. There is no tenderness. There is no rebound.  Musculoskeletal: Normal range of motion.       2+ edema (chronic)   Neurological: He is alert and oriented to person, place, and time.  Skin: Skin is warm and dry.  Psychiatric: He has a normal mood and affect. His behavior is normal. Judgment and thought content normal.    ED Course  Procedures (  including critical care time)  Labs Reviewed  CBC WITH DIFFERENTIAL - Abnormal; Notable for the following:    WBC 16.1 (*)     RDW 16.0 (*)     Neutrophils Relative 82 (*)     Neutro Abs 13.1 (*)     Lymphocytes Relative 9 (*)     Monocytes Absolute 1.5 (*)     All other components within normal limits  COMPREHENSIVE METABOLIC PANEL - Abnormal; Notable for  the following:    Sodium 133 (*)     Creatinine, Ser 1.64 (*)     Total Protein 5.8 (*)     Albumin 3.0 (*)     GFR calc non Af Amer 37 (*)     GFR calc Af Amer 43 (*)     All other components within normal limits  PRO B NATRIURETIC PEPTIDE - Abnormal; Notable for the following:    Pro B Natriuretic peptide (BNP) 2313.0 (*)     All other components within normal limits  TROPONIN I  URINALYSIS, ROUTINE W REFLEX MICROSCOPIC  LACTIC ACID, PLASMA  CULTURE, BLOOD (ROUTINE X 2)  CULTURE, BLOOD (ROUTINE X 2)   Dg Chest 2 View  05/16/2012  *RADIOLOGY REPORT*  Clinical Data: Former smoker presenting with dizziness and weakness.  Prior CABG.  CHEST - 2 VIEW  Comparison: Two-view chest x-ray 11/01/2011, 07/04/2011, 04/26/2011.  Findings: Prior sternotomy for CABG.  Cardiac silhouette enlarged but stable.  Right subclavian dual lead transvenous pacemaker with the lead tips projected over the expected location of the right atrial appendage and right ventricular apex, unchanged.  Airspace consolidation in the right upper lobe.  Lungs otherwise clear. Pulmonary vascularity normal.  Small right pleural effusion suspected.  Degenerative changes involving the thoracic spine.  IMPRESSION: Right upper lobe pneumonia.  Stable cardiomegaly without pulmonary edema.  Small right pleural effusion.   Original Report Authenticated By: Hulan Saas, M.D.    Ct Head Wo Contrast  05/16/2012  *RADIOLOGY REPORT*  Clinical Data: Dizziness  CT HEAD WITHOUT CONTRAST  Technique:  Contiguous axial images were obtained from the base of the skull through the vertex without contrast.  Comparison: 07/04/2011  Findings: There is prominence of the sulci and ventricles consistent with brain atrophy. There is diffuse patchy low density throughout the subcortical and periventricular white matter consistent with chronic small vessel ischemic change.  There is no evidence for acute brain infarct, hemorrhage or mass.  The paranasal sinuses  and mastoid air cells are clear.  The skull is intact.  IMPRESSION: No acute intracranial abnormalities.   Original Report Authenticated By: Signa Kell, M.D.      No diagnosis found.   Date: 05/16/2012  Rate: 71  Rhythm: atrial paced   QRS Axis: normal  Intervals: normal  ST/T Wave abnormalities: nonspecific ST changes  Conduction Disutrbances:none  Narrative Interpretation: + PVCs  Old EKG Reviewed: unchanged    MDM  Richard Chavez is a 79 y.o. male here with weakness, dizziness. Will consider infections vs stroke vs electrolyte abnormalities. Will do CT head, CXR, UA, labs. His BP is slightly low 94/50 in the room. Will give IVF and reassess.   2:44 PM Patient's BP responded with 500 cc bolus. CXR showed RUL pneumonia. He is given ceftriaxone, azithro. WBC 16. Cr 1.6 (baseline 1.2), likely from dehydration. He is admitted for pneumonia and acute renal failure. I discussed with Dr. Waymon Amato.        Richardean Canal, MD 05/16/12 [phone]

## 2012-05-16 NOTE — ED Notes (Signed)
4w called for report. RN will call back 

## 2012-05-17 DIAGNOSIS — J449 Chronic obstructive pulmonary disease, unspecified: Secondary | ICD-10-CM

## 2012-05-17 DIAGNOSIS — I25810 Atherosclerosis of coronary artery bypass graft(s) without angina pectoris: Secondary | ICD-10-CM

## 2012-05-17 DIAGNOSIS — D72829 Elevated white blood cell count, unspecified: Secondary | ICD-10-CM

## 2012-05-17 LAB — BASIC METABOLIC PANEL
BUN: 22 mg/dL (ref 6–23)
Chloride: 100 mEq/L (ref 96–112)
Glucose, Bld: 118 mg/dL — ABNORMAL HIGH (ref 70–99)
Potassium: 4.1 mEq/L (ref 3.5–5.1)

## 2012-05-17 LAB — URINALYSIS, ROUTINE W REFLEX MICROSCOPIC
Glucose, UA: NEGATIVE mg/dL
Leukocytes, UA: NEGATIVE
Protein, ur: NEGATIVE mg/dL
Specific Gravity, Urine: 1.019 (ref 1.005–1.030)
pH: 6.5 (ref 5.0–8.0)

## 2012-05-17 LAB — CBC
HCT: 36.9 % — ABNORMAL LOW (ref 39.0–52.0)
Hemoglobin: 11.9 g/dL — ABNORMAL LOW (ref 13.0–17.0)
MCH: 28.5 pg (ref 26.0–34.0)
MCHC: 32.2 g/dL (ref 30.0–36.0)

## 2012-05-17 LAB — LEGIONELLA ANTIGEN, URINE

## 2012-05-17 LAB — STREP PNEUMONIAE URINARY ANTIGEN: Strep Pneumo Urinary Antigen: NEGATIVE

## 2012-05-17 MED ORDER — PREDNISONE 50 MG PO TABS
50.0000 mg | ORAL_TABLET | Freq: Every day | ORAL | Status: DC
  Administered 2012-05-18: 50 mg via ORAL
  Filled 2012-05-17 (×2): qty 1

## 2012-05-17 MED ORDER — ENOXAPARIN SODIUM 40 MG/0.4ML ~~LOC~~ SOLN
40.0000 mg | SUBCUTANEOUS | Status: DC
  Administered 2012-05-17: 40 mg via SUBCUTANEOUS
  Filled 2012-05-17 (×2): qty 0.4

## 2012-05-17 NOTE — Progress Notes (Signed)
[facility] PROGRESS NOTE  Richard Chavez ZOX:[id number] DOB: [birth year] DOA: 05/16/2012 PCP: Dorcas Carrow, MD  Assessment/Plan: 1. RUL pneumonia - improving on IV rocephin and azithromycin - check wbc tomorrow currently trending down - urine antigen for legionella or strep pneumo negative  2. Hypotension resolved - was likely due to dehydration. - home antihypertensive medication currently being held.  Will continue to hold at this juncture - continue to monitor blood pressures - improved with rehydration and improved oral intake  3.  Acute on chronic kidney disease - most likely 2ary to prerenal azotemia and currently improving with improved oral rehydration - Check creatinine levels next am.  4. COPD - stable no wheezes. Continue nebulizers. Decrease steroids to 50 mg po daily and would favor only a total of five days on prednisone.  5. GERD - stable continue current regimen  6. CAD - continue statin and omega 3  Code Status: full Family Communication:no family at bedside Disposition Plan: If continued improvement will most likely d/c next am 05/19/11   Consultants:  none  Procedures:  CT of head  Antibiotics:  Azithromycin   Ceftriaxone  HPI/Subjective: Patient mentions that he feels much better today. No new complaints no issues overnight.  He reports eating and keeping his food down.  Objective: Filed Vitals:   05/16/12 2141 05/17/12 0533 05/17/12 0906 05/17/12 1448  BP: 106/53 115/84  135/69  Pulse: 72 81  64  Temp: 97.6 F (36.4 C) 97.9 F (36.6 C)  98.3 F (36.8 C)  TempSrc: Oral Oral  Oral  Resp: 18 18  20   Height:      Weight:  107.4 kg (236 lb 12.4 oz)    SpO2: 97% 97% 97% 98%    Intake/Output Summary (Last 24 hours) at 05/17/12 1659 Last data filed at 05/17/12 1448  Gross per 24 hour  Intake    770 ml  Output    751 ml  Net     19 ml   Filed Weights   05/16/12 1700 05/17/12 0533  Weight: 107.276 kg (236 lb 8 oz) 107.4 kg  (236 lb 12.4 oz)    Exam:   General:  Pt in NAD, Alert and Oriented x 3  Cardiovascular: RRR, No MRG  Respiratory: no wheezes, breath sounds over both bases, speaking in full sentences  Abdomen: soft, NT, ND  Data Reviewed: Basic Metabolic Panel:  Lab 05/17/12 8119 05/16/12 1305  NA 130* 133*  K 4.1 4.2  CL 100 101  CO2 22 22  GLUCOSE 118* 92  BUN 22 21  CREATININE 1.47* 1.64*  CALCIUM 8.1* 8.7  MG -- --  PHOS -- --   Liver Function Tests:  Lab 05/16/12 1305  AST 12  ALT 10  ALKPHOS 56  BILITOT 1.2  PROT 5.8*  ALBUMIN 3.0*   No results found for this basename: LIPASE:5,AMYLASE:5 in the last 168 hours No results found for this basename: AMMONIA:5 in the last 168 hours CBC:  Lab 05/17/12 0536 05/16/12 1305  WBC 14.6* 16.1*  NEUTROABS -- 13.1*  HGB 11.9* 13.8  HCT 36.9* 41.1  MCV 88.5 88.4  PLT 146* 161   Cardiac Enzymes:  Lab 05/16/12 1305  CKTOTAL --  CKMB --  CKMBINDEX --  TROPONINI <0.30   BNP (last 3 results)  Basename 05/16/12 1305 07/04/11 1826  PROBNP 2313.0* 3662.0*   CBG: No results found for this basename: GLUCAP:5 in the last 168 hours  No results found for this or any previous  visit (from the past 240 hour(s)).   Studies: Dg Chest 2 View  05/16/2012  *RADIOLOGY REPORT*  Clinical Data: Former smoker presenting with dizziness and weakness.  Prior CABG.  CHEST - 2 VIEW  Comparison: Two-view chest x-ray 11/01/2011, 07/04/2011, 04/26/2011.  Findings: Prior sternotomy for CABG.  Cardiac silhouette enlarged but stable.  Right subclavian dual lead transvenous pacemaker with the lead tips projected over the expected location of the right atrial appendage and right ventricular apex, unchanged.  Airspace consolidation in the right upper lobe.  Lungs otherwise clear. Pulmonary vascularity normal.  Small right pleural effusion suspected.  Degenerative changes involving the thoracic spine.  IMPRESSION: Right upper lobe pneumonia.  Stable cardiomegaly  without pulmonary edema.  Small right pleural effusion.   Original Report Authenticated By: Hulan Saas, M.D.    Ct Head Wo Contrast  05/16/2012  *RADIOLOGY REPORT*  Clinical Data: Dizziness  CT HEAD WITHOUT CONTRAST  Technique:  Contiguous axial images were obtained from the base of the skull through the vertex without contrast.  Comparison: 07/04/2011  Findings: There is prominence of the sulci and ventricles consistent with brain atrophy. There is diffuse patchy low density throughout the subcortical and periventricular white matter consistent with chronic small vessel ischemic change.  There is no evidence for acute brain infarct, hemorrhage or mass.  The paranasal sinuses and mastoid air cells are clear.  The skull is intact.  IMPRESSION: No acute intracranial abnormalities.   Original Report Authenticated By: Signa Kell, M.D.     Scheduled Meds:   . atorvastatin  20 mg Oral QHS  . azithromycin  500 mg Intravenous Q24H  . cefTRIAXone (ROCEPHIN)  IV  1 g Intravenous Q24H  . enoxaparin (LOVENOX) injection  40 mg Subcutaneous Q24H  . famotidine  20 mg Oral BID  . gabapentin  600 mg Oral TID  . guaiFENesin  600 mg Oral Daily  . hydrocortisone sod succinate (SOLU-CORTEF) injection  50 mg Intravenous Q8H  . Ipratropium-Albuterol  1 puff Inhalation BID  . loratadine  10 mg Oral Daily  . magnesium chloride  1 tablet Oral BID  . mometasone-formoterol  2 puff Inhalation BID  . omega-3 acid ethyl esters  1 g Oral Daily  . sodium chloride  3 mL Intravenous Q12H  . tiotropium  18 mcg Inhalation Daily   Continuous Infusions:   Principal Problem:  *PNA (pneumonia) Active Problems:  Hypertension  Hypercholesteremia  COPD (chronic obstructive pulmonary disease)  GERD (gastroesophageal reflux disease)  CAD CABG 1998, patent grafts 06/06/11 after abn Myoview  Pacemaker, [employer] Jude, implanted 2008  Cardiomyopathy, ischemic, EF 30-35% by recent cath  Hypotension  Leukocytosis  Renal failure  (ARF), acute on chronic  Dehydration    Time spent: > 35 minutes    Penny Pia  [facility] Pager [phone]. If 8PM-8AM, please contact night-coverage at [email or web address], password [facility] 05/17/2012, 4:59 PM  LOS: 1 day

## 2012-05-17 NOTE — Care Management (Signed)
UR completed 

## 2012-05-18 DIAGNOSIS — I10 Essential (primary) hypertension: Secondary | ICD-10-CM

## 2012-05-18 DIAGNOSIS — K219 Gastro-esophageal reflux disease without esophagitis: Secondary | ICD-10-CM

## 2012-05-18 LAB — BASIC METABOLIC PANEL
CO2: 22 mEq/L (ref 19–32)
Calcium: 8.2 mg/dL — ABNORMAL LOW (ref 8.4–10.5)
Chloride: 102 mEq/L (ref 96–112)
Sodium: 133 mEq/L — ABNORMAL LOW (ref 135–145)

## 2012-05-18 MED ORDER — FUROSEMIDE 40 MG PO TABS: 40.0000 mg | ORAL_TABLET | Freq: Every day | ORAL | Status: DC

## 2012-05-18 MED ORDER — PREDNISONE 20 MG PO TABS: ORAL_TABLET | ORAL | Status: DC

## 2012-05-18 MED ORDER — AZITHROMYCIN 500 MG PO TABS: 500.0000 mg | ORAL_TABLET | Freq: Every day | ORAL | Status: DC

## 2012-05-18 MED ORDER — AMOXICILLIN 500 MG PO CAPS: 500.0000 mg | ORAL_CAPSULE | Freq: Three times a day (TID) | ORAL | Status: DC

## 2012-05-18 NOTE — Discharge Summary (Signed)
Physician Discharge Summary  Richard Chavez AVW:[id number] DOB: 1927/02/14 DOA: 05/16/2012  PCP: Dorcas Carrow, MD  Admit date: 05/16/2012 Discharge date: 05/18/2012  Time spent: > 35  minutes  Recommendations for Outpatient Follow-up:  1. Please be sure to f/u with respiratory status 2. Follow up with blood pressures and consider restarting medication should patient's blood pressure remain uncontrolled 3. F/u with creatinine ARB held due to recent ARF while in house initially  Discharge Diagnoses:  Principal Problem:  *PNA (pneumonia) Active Problems:  Hypertension  Hypercholesteremia  COPD (chronic obstructive pulmonary disease)  GERD (gastroesophageal reflux disease)  CAD CABG 1998, patent grafts 06/06/11 after abn Myoview  Pacemaker, [employer] Jude, implanted 2008  Cardiomyopathy, ischemic, EF 30-35% by recent cath  Hypotension  Leukocytosis  Renal failure (ARF), acute on chronic  Dehydration   Discharge Condition: Stable  Diet recommendation: Heart healthy  Filed Weights   05/16/12 1700 05/17/12 0533 05/18/12 0509  Weight: 107.276 kg (236 lb 8 oz) 107.4 kg (236 lb 12.4 oz) 107.5 kg (236 lb 15.9 oz)    History of present illness:  From original HPI: Richard Chavez is a 79 y.o. male with history of HTN, HL, COPD, CAD status post CABG-(patent grafts by cath in January 2013),ischemic cardiomyopathy (EF 30-35%), pacemaker, sleep apnea, GERD, chronic kidney disease, presented to the ED on 05/16/12 with complaints of generalized weakness and dizziness. Patient and his son Mr. Richard Chavez of providing history. Patient was in his usual state of health until last night. He was sitting on a rocking chair and started feeling weak and dizzy. He had some chills but no fever. He denies any other symptoms. He informed his son and retired for the night. This morning he continued to experience same symptoms and decided to come to the ED. In the ED his initial blood pressure was 91/48 mmHg, white  blood cell count of 16,000 and chest x-ray suggesting right upper lobe pneumonia. Patient denies cough, dyspnea, chest pain, sore throat, headache, nausea, vomiting, abdominal pain, diarrhea, urinary symptoms or skin rash. No sickly contacts. He was bolused with IV fluids. He indicates that his dizziness has resolved. The hospitalist service is requested to admit for further evaluation and management   Hospital Course:  1. RUL pneumonia - improved on IV rocephin and azithromycin, transition to oral regimen. D/C on amoxicillin 500 mg po tid. - Patient improved clinically on exam and breathing comfortably on room air. - urine antigen for legionella or strep pneumo negative   2. Hypotension resolved  - was likely due to dehydration.  - home antihypertensive medication currently being held. Will continue to hold at this juncture  - continue to monitor blood pressures  - improved with rehydration and improved oral intake   3. Acute on chronic kidney disease  - most likely 2ary to prerenal azotemia and currently improving with improved oral rehydration  - Will recommend that PCP follow creatinine levels.  4. COPD  - stable no wheezes. Continue nebulizers. Place on steroid taper given history of daily use of prednisone.  5. GERD  - stable continue home regimen.  6. CAD  - continue statin and omega 3  Procedures:  CT of head w/o contrast  Consultations:  none  Discharge Exam: Filed Vitals:   05/17/12 2009 05/17/12 2208 05/18/12 0509 05/18/12 0900  BP:  141/75 144/73   Pulse:  67 61   Temp:  97.2 F (36.2 C) 98.3 F (36.8 C)   TempSrc:  Oral Oral  Resp:  18 18   Height:      Weight:   107.5 kg (236 lb 15.9 oz)   SpO2: 97% 98% 98% 98%    General: Pt in NAD, Alert and awake sitting up smiling in chair Cardiovascular: RRR, No MRG Respiratory: CTA BL, no wheezes, speaking in full sentences  Discharge Instructions  Discharge Orders    Future Orders Please Complete By  Expires   Diet - low sodium heart healthy      Increase activity slowly      Discharge instructions      Comments:   Please be sure to follow up with your primary care physician in 1-2 weeks or sooner should any new concerns arise.   Call MD for:  temperature >100.4      Call MD for:  difficulty breathing, headache or visual disturbances      Call MD for:  persistant dizziness or light-headedness      Call MD for:  extreme fatigue          Medication List     As of 05/18/2012 12:12 PM    STOP taking these medications         guaiFENesin 600 MG 12 hr tablet   Commonly known as: MUCINEX      losartan 50 MG tablet   Commonly known as: COZAAR      metoprolol tartrate 25 MG tablet   Commonly known as: LOPRESSOR      TAKE these medications         ADVAIR DISKUS 500-50 MCG/DOSE Aepb   Generic drug: Fluticasone-Salmeterol   Inhale 1 puff into the lungs every 12 (twelve) hours.      amoxicillin 500 MG capsule   Commonly known as: AMOXIL   Take 1 capsule (500 mg total) by mouth 3 (three) times daily.      atorvastatin 20 MG tablet   Commonly known as: LIPITOR   Take 20 mg by mouth daily with breakfast.      azithromycin 500 MG tablet   Commonly known as: ZITHROMAX   Take 1 tablet (500 mg total) by mouth daily.      COMBIVENT 18-103 MCG/ACT inhaler   Generic drug: albuterol-ipratropium   Inhale 2 puffs into the lungs 2 (two) times daily.      Fish Oil 1000 MG Caps   Take 1 capsule by mouth daily.      fluticasone 50 MCG/ACT nasal spray   Commonly known as: FLONASE   Place 2 sprays into the nose 2 (two) times daily as needed. For dryness.      furosemide 40 MG tablet   Commonly known as: LASIX   Take 1 tablet (40 mg total) by mouth daily with breakfast.   Start taking on: 05/23/2012      gabapentin 300 MG capsule   Commonly known as: NEURONTIN   Take 600 mg by mouth 3 (three) times daily.      loratadine 10 MG tablet   Commonly known as: CLARITIN   Take 10 mg by  mouth daily with breakfast.      potassium chloride 10 MEQ tablet   Commonly known as: K-DUR   Take 10 mEq by mouth 2 (two) times daily.      predniSONE 20 MG tablet   Commonly known as: DELTASONE   Take 2 tabs orally (40 mg) daily for the next 2 days, then take 1 tab orally (20 mg) for the next 2 days then take 0.5  tab orally (10 mg) for the next 2 days then continue with your home dose of 5 mg orally daily.      ranitidine 150 MG capsule   Commonly known as: ZANTAC   Take 150 mg by mouth 2 (two) times daily.      SLOW-MAG 535 (64 MG) MG Tbcr   Generic drug: Magnesium Chloride   Take 1 tablet by mouth 2 (two) times daily.      tiotropium 18 MCG inhalation capsule   Commonly known as: SPIRIVA   Place 18 mcg into inhaler and inhale daily with breakfast.      Vitamin D (Ergocalciferol) 50000 UNITS Caps   Commonly known as: DRISDOL   Take 50,000 Units by mouth every 7 (seven) days. Take on Fridays      vitamin E 1000 UNIT capsule   Take 1,000 Units by mouth daily with breakfast.          The results of significant diagnostics from this hospitalization (including imaging, microbiology, ancillary and laboratory) are listed below for reference.    Significant Diagnostic Studies: Dg Chest 2 View  05/16/2012  *RADIOLOGY REPORT*  Clinical Data: Former smoker presenting with dizziness and weakness.  Prior CABG.  CHEST - 2 VIEW  Comparison: Two-view chest x-ray 11/01/2011, 07/04/2011, 04/26/2011.  Findings: Prior sternotomy for CABG.  Cardiac silhouette enlarged but stable.  Right subclavian dual lead transvenous pacemaker with the lead tips projected over the expected location of the right atrial appendage and right ventricular apex, unchanged.  Airspace consolidation in the right upper lobe.  Lungs otherwise clear. Pulmonary vascularity normal.  Small right pleural effusion suspected.  Degenerative changes involving the thoracic spine.  IMPRESSION: Right upper lobe pneumonia.  Stable  cardiomegaly without pulmonary edema.  Small right pleural effusion.   Original Report Authenticated By: Hulan Saas, M.D.    Ct Head Wo Contrast  05/16/2012  *RADIOLOGY REPORT*  Clinical Data: Dizziness  CT HEAD WITHOUT CONTRAST  Technique:  Contiguous axial images were obtained from the base of the skull through the vertex without contrast.  Comparison: 07/04/2011  Findings: There is prominence of the sulci and ventricles consistent with brain atrophy. There is diffuse patchy low density throughout the subcortical and periventricular white matter consistent with chronic small vessel ischemic change.  There is no evidence for acute brain infarct, hemorrhage or mass.  The paranasal sinuses and mastoid air cells are clear.  The skull is intact.  IMPRESSION: No acute intracranial abnormalities.   Original Report Authenticated By: Signa Kell, M.D.     Microbiology: No results found for this or any previous visit (from the past 240 hour(s)).   Labs: Basic Metabolic Panel:  Lab 05/18/12 2956 05/17/12 0536 05/16/12 1305  NA 133* 130* 133*  K 3.9 4.1 4.2  CL 102 100 101  CO2 22 22 22   GLUCOSE 100* 118* 92  BUN 16 22 21   CREATININE 1.34 1.47* 1.64*  CALCIUM 8.2* 8.1* 8.7  MG -- -- --  PHOS -- -- --   Liver Function Tests:  Lab 05/16/12 1305  AST 12  ALT 10  ALKPHOS 56  BILITOT 1.2  PROT 5.8*  ALBUMIN 3.0*   No results found for this basename: LIPASE:5,AMYLASE:5 in the last 168 hours No results found for this basename: AMMONIA:5 in the last 168 hours CBC:  Lab 05/17/12 0536 05/16/12 1305  WBC 14.6* 16.1*  NEUTROABS -- 13.1*  HGB 11.9* 13.8  HCT 36.9* 41.1  MCV 88.5 88.4  PLT 146*  161   Cardiac Enzymes:  Lab 05/16/12 1305  CKTOTAL --  CKMB --  CKMBINDEX --  TROPONINI <0.30   BNP: BNP (last 3 results)  Basename 05/16/12 1305 07/04/11 1826  PROBNP 2313.0* 3662.0*   CBG: No results found for this basename: GLUCAP:5 in the last 168 hours     Signed:  Penny Pia  [facility] 05/18/2012, 12:12 PM

## 2012-05-23 LAB — CULTURE, BLOOD (ROUTINE X 2): Culture: NO GROWTH

## 2012-08-25 ENCOUNTER — Encounter: Payer: Self-pay | Admitting: *Deleted

## 2012-09-15 ENCOUNTER — Inpatient Hospital Stay (HOSPITAL_COMMUNITY)
Admission: EM | Admit: 2012-09-15 | Discharge: 2012-09-17 | DRG: 194 | Disposition: A | Discharge status: Home / Self Care | Payer: Medicare Other | Attending: Internal Medicine | Admitting: Internal Medicine

## 2012-09-15 ENCOUNTER — Encounter (HOSPITAL_COMMUNITY): Payer: Self-pay

## 2012-09-15 ENCOUNTER — Emergency Department (HOSPITAL_COMMUNITY): Payer: Medicare Other

## 2012-09-15 ENCOUNTER — Encounter: Payer: Self-pay | Admitting: Cardiology

## 2012-09-15 DIAGNOSIS — D72829 Elevated white blood cell count, unspecified: Secondary | ICD-10-CM | POA: Diagnosis present

## 2012-09-15 DIAGNOSIS — R531 Weakness: Secondary | ICD-10-CM

## 2012-09-15 DIAGNOSIS — K2270 Barrett's esophagus without dysplasia: Secondary | ICD-10-CM | POA: Diagnosis present

## 2012-09-15 DIAGNOSIS — I739 Peripheral vascular disease, unspecified: Secondary | ICD-10-CM | POA: Diagnosis present

## 2012-09-15 DIAGNOSIS — R600 Localized edema: Secondary | ICD-10-CM

## 2012-09-15 DIAGNOSIS — N183 Chronic kidney disease, stage 3 unspecified: Secondary | ICD-10-CM | POA: Diagnosis present

## 2012-09-15 DIAGNOSIS — I442 Atrioventricular block, complete: Secondary | ICD-10-CM | POA: Diagnosis present

## 2012-09-15 DIAGNOSIS — I5042 Chronic combined systolic (congestive) and diastolic (congestive) heart failure: Secondary | ICD-10-CM

## 2012-09-15 DIAGNOSIS — N390 Urinary tract infection, site not specified: Secondary | ICD-10-CM

## 2012-09-15 DIAGNOSIS — I4949 Other premature depolarization: Secondary | ICD-10-CM | POA: Diagnosis present

## 2012-09-15 DIAGNOSIS — J189 Pneumonia, unspecified organism: Secondary | ICD-10-CM

## 2012-09-15 DIAGNOSIS — R55 Syncope and collapse: Secondary | ICD-10-CM

## 2012-09-15 DIAGNOSIS — N182 Chronic kidney disease, stage 2 (mild): Secondary | ICD-10-CM | POA: Insufficient documentation

## 2012-09-15 DIAGNOSIS — E860 Dehydration: Secondary | ICD-10-CM

## 2012-09-15 DIAGNOSIS — Z950 Presence of cardiac pacemaker: Secondary | ICD-10-CM

## 2012-09-15 DIAGNOSIS — I10 Essential (primary) hypertension: Secondary | ICD-10-CM

## 2012-09-15 DIAGNOSIS — I509 Heart failure, unspecified: Secondary | ICD-10-CM | POA: Diagnosis present

## 2012-09-15 DIAGNOSIS — I472 Ventricular tachycardia: Secondary | ICD-10-CM

## 2012-09-15 DIAGNOSIS — E7800 Pure hypercholesterolemia, unspecified: Secondary | ICD-10-CM | POA: Diagnosis present

## 2012-09-15 DIAGNOSIS — K219 Gastro-esophageal reflux disease without esophagitis: Secondary | ICD-10-CM | POA: Diagnosis present

## 2012-09-15 DIAGNOSIS — G4733 Obstructive sleep apnea (adult) (pediatric): Secondary | ICD-10-CM | POA: Diagnosis present

## 2012-09-15 DIAGNOSIS — J449 Chronic obstructive pulmonary disease, unspecified: Secondary | ICD-10-CM

## 2012-09-15 DIAGNOSIS — I255 Ischemic cardiomyopathy: Secondary | ICD-10-CM

## 2012-09-15 DIAGNOSIS — R609 Edema, unspecified: Secondary | ICD-10-CM | POA: Diagnosis present

## 2012-09-15 DIAGNOSIS — I252 Old myocardial infarction: Secondary | ICD-10-CM

## 2012-09-15 DIAGNOSIS — N179 Acute kidney failure, unspecified: Secondary | ICD-10-CM

## 2012-09-15 DIAGNOSIS — E785 Hyperlipidemia, unspecified: Secondary | ICD-10-CM | POA: Diagnosis present

## 2012-09-15 DIAGNOSIS — I129 Hypertensive chronic kidney disease with stage 1 through stage 4 chronic kidney disease, or unspecified chronic kidney disease: Secondary | ICD-10-CM | POA: Diagnosis present

## 2012-09-15 DIAGNOSIS — N400 Enlarged prostate without lower urinary tract symptoms: Secondary | ICD-10-CM | POA: Diagnosis present

## 2012-09-15 DIAGNOSIS — I25810 Atherosclerosis of coronary artery bypass graft(s) without angina pectoris: Secondary | ICD-10-CM

## 2012-09-15 DIAGNOSIS — Z79899 Other long term (current) drug therapy: Secondary | ICD-10-CM

## 2012-09-15 DIAGNOSIS — J4489 Other specified chronic obstructive pulmonary disease: Secondary | ICD-10-CM | POA: Diagnosis present

## 2012-09-15 DIAGNOSIS — IMO0002 Reserved for concepts with insufficient information to code with codable children: Secondary | ICD-10-CM

## 2012-09-15 DIAGNOSIS — I959 Hypotension, unspecified: Secondary | ICD-10-CM

## 2012-09-15 DIAGNOSIS — I219 Acute myocardial infarction, unspecified: Secondary | ICD-10-CM

## 2012-09-15 DIAGNOSIS — I2589 Other forms of chronic ischemic heart disease: Secondary | ICD-10-CM | POA: Diagnosis present

## 2012-09-15 LAB — BASIC METABOLIC PANEL
CO2: 26 mEq/L (ref 19–32)
Chloride: 100 mEq/L (ref 96–112)
Creatinine, Ser: 1.66 mg/dL — ABNORMAL HIGH (ref 0.50–1.35)
Potassium: 3.6 mEq/L (ref 3.5–5.1)

## 2012-09-15 LAB — CBC
MCV: 86 fL (ref 78.0–100.0)
Platelets: 168 10*3/uL (ref 150–400)
RBC: 4.93 MIL/uL (ref 4.22–5.81)
WBC: 13.2 10*3/uL — ABNORMAL HIGH (ref 4.0–10.5)

## 2012-09-15 LAB — PRO B NATRIURETIC PEPTIDE: Pro B Natriuretic peptide (BNP): 1760 pg/mL — ABNORMAL HIGH (ref 0–450)

## 2012-09-15 LAB — POCT I-STAT TROPONIN I: Troponin i, poc: 0 ng/mL (ref 0.00–0.08)

## 2012-09-15 LAB — URINALYSIS, ROUTINE W REFLEX MICROSCOPIC
Bilirubin Urine: NEGATIVE
Nitrite: NEGATIVE
Protein, ur: NEGATIVE mg/dL
Specific Gravity, Urine: 1.016 (ref 1.005–1.030)
Urobilinogen, UA: 0.2 mg/dL (ref 0.0–1.0)

## 2012-09-15 LAB — CG4 I-STAT (LACTIC ACID): Lactic Acid, Venous: 1.76 mmol/L (ref 0.5–2.2)

## 2012-09-15 MED ORDER — AZITHROMYCIN 250 MG PO TABS
500.0000 mg | ORAL_TABLET | Freq: Once | ORAL | Status: AC
  Administered 2012-09-15: 500 mg via ORAL
  Filled 2012-09-15: qty 2

## 2012-09-15 MED ORDER — AZITHROMYCIN 500 MG PO TABS
500.0000 mg | ORAL_TABLET | ORAL | Status: DC
  Administered 2012-09-16 – 2012-09-17 (×2): 500 mg via ORAL
  Filled 2012-09-15 (×2): qty 1

## 2012-09-15 MED ORDER — GUAIFENESIN ER 600 MG PO TB12
1200.0000 mg | ORAL_TABLET | Freq: Two times a day (BID) | ORAL | Status: DC
  Administered 2012-09-15 – 2012-09-17 (×4): 1200 mg via ORAL
  Filled 2012-09-15 (×5): qty 2

## 2012-09-15 MED ORDER — GABAPENTIN 300 MG PO CAPS
600.0000 mg | ORAL_CAPSULE | Freq: Three times a day (TID) | ORAL | Status: DC
  Administered 2012-09-15 – 2012-09-17 (×6): 600 mg via ORAL
  Filled 2012-09-15 (×7): qty 2

## 2012-09-15 MED ORDER — ACETAMINOPHEN 325 MG PO TABS
650.0000 mg | ORAL_TABLET | Freq: Once | ORAL | Status: AC
  Administered 2012-09-15: 650 mg via ORAL
  Filled 2012-09-15: qty 2

## 2012-09-15 MED ORDER — POTASSIUM CHLORIDE ER 10 MEQ PO TBCR
10.0000 meq | EXTENDED_RELEASE_TABLET | Freq: Two times a day (BID) | ORAL | Status: DC
  Administered 2012-09-15 – 2012-09-17 (×4): 10 meq via ORAL
  Filled 2012-09-15 (×5): qty 1

## 2012-09-15 MED ORDER — METOPROLOL TARTRATE 25 MG PO TABS
25.0000 mg | ORAL_TABLET | Freq: Two times a day (BID) | ORAL | Status: DC
  Administered 2012-09-15 – 2012-09-17 (×4): 25 mg via ORAL
  Filled 2012-09-15 (×5): qty 1

## 2012-09-15 MED ORDER — DEXTROSE 5 % IV SOLN
1.0000 g | Freq: Once | INTRAVENOUS | Status: AC
  Administered 2012-09-15: 1 g via INTRAVENOUS
  Filled 2012-09-15: qty 10

## 2012-09-15 MED ORDER — FUROSEMIDE 40 MG PO TABS
40.0000 mg | ORAL_TABLET | Freq: Every day | ORAL | Status: DC
  Administered 2012-09-16 – 2012-09-17 (×2): 40 mg via ORAL
  Filled 2012-09-15 (×2): qty 1

## 2012-09-15 MED ORDER — MOMETASONE FURO-FORMOTEROL FUM 200-5 MCG/ACT IN AERO
2.0000 | INHALATION_SPRAY | Freq: Two times a day (BID) | RESPIRATORY_TRACT | Status: DC
  Administered 2012-09-15 – 2012-09-17 (×4): 2 via RESPIRATORY_TRACT
  Filled 2012-09-15: qty 8.8

## 2012-09-15 MED ORDER — FAMOTIDINE 20 MG PO TABS
20.0000 mg | ORAL_TABLET | Freq: Every day | ORAL | Status: DC
  Administered 2012-09-16 – 2012-09-17 (×2): 20 mg via ORAL
  Filled 2012-09-15 (×3): qty 1

## 2012-09-15 MED ORDER — PREDNISONE 5 MG PO TABS
5.0000 mg | ORAL_TABLET | Freq: Every day | ORAL | Status: DC
  Administered 2012-09-16 – 2012-09-17 (×2): 5 mg via ORAL
  Filled 2012-09-15 (×3): qty 1

## 2012-09-15 MED ORDER — SODIUM CHLORIDE 0.9 % IV BOLUS (SEPSIS)
500.0000 mL | Freq: Once | INTRAVENOUS | Status: AC
  Administered 2012-09-15: 500 mL via INTRAVENOUS

## 2012-09-15 MED ORDER — DEXTROSE 5 % IV SOLN
1.0000 g | INTRAVENOUS | Status: DC
  Administered 2012-09-16: 1 g via INTRAVENOUS
  Filled 2012-09-15 (×2): qty 10

## 2012-09-15 MED ORDER — TIOTROPIUM BROMIDE MONOHYDRATE 18 MCG IN CAPS
18.0000 ug | ORAL_CAPSULE | Freq: Every day | RESPIRATORY_TRACT | Status: DC
  Administered 2012-09-16 – 2012-09-17 (×2): 18 ug via RESPIRATORY_TRACT
  Filled 2012-09-15: qty 5

## 2012-09-15 MED ORDER — FLUTICASONE PROPIONATE 50 MCG/ACT NA SUSP
2.0000 | Freq: Two times a day (BID) | NASAL | Status: DC | PRN
  Filled 2012-09-15: qty 16

## 2012-09-15 MED ORDER — LORATADINE 10 MG PO TABS
10.0000 mg | ORAL_TABLET | Freq: Every day | ORAL | Status: DC
  Administered 2012-09-16 – 2012-09-17 (×2): 10 mg via ORAL
  Filled 2012-09-15 (×2): qty 1

## 2012-09-15 MED ORDER — SODIUM CHLORIDE 0.9 % IV SOLN: INTRAVENOUS | Status: DC

## 2012-09-15 MED ORDER — IPRATROPIUM-ALBUTEROL 18-103 MCG/ACT IN AERO
2.0000 | INHALATION_SPRAY | Freq: Two times a day (BID) | RESPIRATORY_TRACT | Status: DC
  Filled 2012-09-15: qty 14.7

## 2012-09-15 MED ORDER — ENOXAPARIN SODIUM 40 MG/0.4ML ~~LOC~~ SOLN
40.0000 mg | SUBCUTANEOUS | Status: DC
  Administered 2012-09-15 – 2012-09-16 (×2): 40 mg via SUBCUTANEOUS
  Filled 2012-09-15 (×3): qty 0.4

## 2012-09-15 MED ORDER — SODIUM CHLORIDE 0.9 % IV SOLN
INTRAVENOUS | Status: DC
  Administered 2012-09-15 – 2012-09-16 (×3): via INTRAVENOUS

## 2012-09-15 MED ORDER — ATORVASTATIN CALCIUM 20 MG PO TABS
20.0000 mg | ORAL_TABLET | Freq: Every day | ORAL | Status: DC
  Administered 2012-09-15 – 2012-09-17 (×3): 20 mg via ORAL
  Filled 2012-09-15 (×3): qty 1

## 2012-09-15 NOTE — ED Notes (Signed)
Per EMS, pt from home, reports weakness.  Denies any pain at this time.  Not able to take meds this am d/t weakness.  Pt is A&Ox 4.

## 2012-09-15 NOTE — ED Provider Notes (Signed)
History     CSN: [id number]  Arrival date & time 09/15/12  1035   First MD Initiated Contact with Patient 09/15/12 1158      Chief Complaint  Patient presents with  . Weakness    (Consider location/radiation/quality/duration/timing/severity/associated sxs/prior treatment) HPI Comments: Richard Chavez is a 79 y.o. Male who presents complaining of weakness and chills. Symptom onset 4 AM today. No sneezing, coughing, chest pain, abdominal pain, nausea, vomiting, headache, or paresthesias. Weakness is generalized, and prevents him from  being able to walk. He has tried eating or drinking today.  He did not take anything at home for the discomfort. He did not take his medicines today. He has a history hypertention. No other known modifying factors.  Patient is a 79 y.o. male presenting with weakness. The history is provided by the patient and a relative.  Weakness    Past Medical History  Diagnosis Date  . CAD (coronary artery disease)   . Hypertension   . Hyperlipidemia   . GERD (gastroesophageal reflux disease)   . COPD (chronic obstructive pulmonary disease)   . CRF (chronic renal failure)   . Barrett's esophagus   . OP (osteoporosis)   . CAD (coronary artery disease) of artery bypass graft 07/04/2011    LIMA-LAD; SVG-OM2-OM3, SVG-RCA -- all patent as of 05/27/11  . Syncope 07/04/2011  . Pacemaker 07/04/2011  . Cardiomyopathy, ischemic 07/07/2011    2D Echo - EF 35-40, moderately dilated left atrium  . Myocardial infarction     25 yrs ago  . Peripheral vascular disease   . Arthritis   . BPH (benign prostatic hyperplasia)   . Polyp of colon     removed  . Sleep apnea     STOP BANG SCORE 4  . AV block, 3rd degree     Post St. Jude pacemaker, EF 35-40, does have intermittent atrial tachycardia, either PAt or Afib  . BPH (benign prostatic hypertrophy) 11/15/2011  . SOB (shortness of breath) on exertion 02/14/2010    2D Echo - EF 35-45,     Past Surgical History  Procedure  Laterality Date  . Pacemaker placement  2007  . Cholecystectomy  2005  . Coronary artery bypass graft  1978  . Lesion excision      from penis  . Cardiac catheterization  06/06/2011  . Cardiac catheterization  06/14/2010    LIMA to LAD, SVG to RCA, SVG to OM1 Circumflex, 100% occluded RCA, 100% occluded circumfkex, 100% occluded LAD, no evidence of graft dysfunction, continue medical therapy  . Cardiac catheterization  04/12/2003    3 vessel coronary artery disease, continue medical treatment  . Cardiac catheterization  10/07/2000    Severe 3 vessel coronary artery disease, continue medical treatment    Family History  Problem Relation Age of Onset  . Leukemia Father   . Heart disease Father   . Alzheimer's disease Mother   . Arthritis Mother   . Lung cancer Son 81  . Alzheimer's disease Maternal Grandmother     History  Substance Use Topics  . Smoking status: Former Smoker -- 0.30 packs/day for 15 years    Types: Cigarettes    Quit date: 05/07/1985  . Smokeless tobacco: Never Used  . Alcohol Use: No      Review of Systems  Neurological: Positive for weakness.  All other systems reviewed and are negative.    Allergies  Review of patient's allergies indicates no known allergies.  Home Medications   Current Outpatient  Rx  Name  Route  Sig  Dispense  Refill  . albuterol-ipratropium (COMBIVENT) 18-103 MCG/ACT inhaler   Inhalation   Inhale 2 puffs into the lungs 2 (two) times daily.          Marland Kitchen atorvastatin (LIPITOR) 20 MG tablet   Oral   Take 20 mg by mouth daily with breakfast.          . fluticasone (FLONASE) 50 MCG/ACT nasal spray   Nasal   Place 2 sprays into the nose 2 (two) times daily as needed. For dryness.         . Fluticasone-Salmeterol (ADVAIR DISKUS) 500-50 MCG/DOSE AEPB   Inhalation   Inhale 1 puff into the lungs every 12 (twelve) hours.          . furosemide (LASIX) 40 MG tablet   Oral   Take 1 tablet (40 mg total) by mouth daily with  breakfast.   30 tablet      . gabapentin (NEURONTIN) 300 MG capsule   Oral   Take 600 mg by mouth 3 (three) times daily.          Marland Kitchen guaiFENesin (MUCINEX) 600 MG 12 hr tablet   Oral   Take 1,200 mg by mouth 2 (two) times daily.         Marland Kitchen loratadine (CLARITIN) 10 MG tablet   Oral   Take 10 mg by mouth daily with breakfast.          . Magnesium Chloride (SLOW-MAG) 535 (64 MG) MG TBCR   Oral   Take 1 tablet by mouth 2 (two) times daily.          . metoprolol tartrate (LOPRESSOR) 25 MG tablet   Oral   Take 25 mg by mouth 2 (two) times daily.         . Omega-3 Fatty Acids (FISH OIL) 1000 MG CAPS   Oral   Take 1 capsule by mouth daily.          . potassium chloride (K-DUR) 10 MEQ tablet   Oral   Take 10 mEq by mouth 2 (two) times daily.          . predniSONE (DELTASONE) 5 MG tablet   Oral   Take 5 mg by mouth daily.         . ranitidine (ZANTAC) 150 MG capsule   Oral   Take 150 mg by mouth 2 (two) times daily.          Marland Kitchen tiotropium (SPIRIVA) 18 MCG inhalation capsule   Inhalation   Place 18 mcg into inhaler and inhale daily with breakfast.          . Vitamin D, Ergocalciferol, (DRISDOL) 50000 UNITS CAPS   Oral   Take 50,000 Units by mouth every 7 (seven) days. Take on Fridays         . vitamin E 1000 UNIT capsule   Oral   Take 1,000 Units by mouth daily with breakfast.            BP 111/44  Pulse 89  Temp(Src) 98.6 F (37 C) (Oral)  Resp 18  Ht 6\' 2"  (1.88 m)  Wt 238 lb (107.956 kg)  BMI 30.54 kg/m2  SpO2 95%  Physical Exam  Nursing note and vitals reviewed. Constitutional: He is oriented to person, place, and time. He appears well-developed and well-nourished.  HENT:  Head: Normocephalic and atraumatic.  Right Ear: External ear normal.  Left Ear: External ear normal.  Dry  mucous membranes  Eyes: Conjunctivae and EOM are normal. Pupils are equal, round, and reactive to light.  Neck: Normal range of motion and phonation normal.  Neck supple.  Cardiovascular: Normal rate, regular rhythm, normal heart sounds and intact distal pulses.   Pulmonary/Chest: Effort normal and breath sounds normal. He exhibits no bony tenderness.  Abdominal: Soft. Normal appearance. There is no tenderness.  Musculoskeletal: Normal range of motion.  No focal asymmetry of strength  Neurological: He is alert and oriented to person, place, and time. He has normal strength. No cranial nerve deficit or sensory deficit. He exhibits normal muscle tone. Coordination normal.  Skin: Skin is warm, dry and intact.  Psychiatric: He has a normal mood and affect. His behavior is normal. Judgment and thought content normal.    ED Course  Procedures (including critical care time)  Medications  0.9 %  sodium chloride infusion (125 mL/hr Intravenous Rate/Dose Change 09/15/12 1502)  acetaminophen (TYLENOL) tablet 650 mg (650 mg Oral Given 09/15/12 1355)  sodium chloride 0.9 % bolus 500 mL (0 mLs Intravenous Stopped 09/15/12 1501)  cefTRIAXone (ROCEPHIN) 1 g in dextrose 5 % 50 mL IVPB (0 g Intravenous Stopped 09/15/12 1435)  azithromycin (ZITHROMAX) tablet 500 mg (500 mg Oral Given 09/15/12 1405)    Patient Vitals for the past 24 hrs:  BP Temp Temp src Pulse Resp SpO2 Height Weight  09/15/12 1601 111/44 mmHg 98.6 F (37 C) Oral - 18 95 % - -  09/15/12 1205 - 101.2 F (38.4 C) Rectal - - - - -  09/15/12 1155 109/44 mmHg - - - - - - -  09/15/12 1116 106/35 mmHg 99.2 F (37.3 C) Oral 89 - 95 % 6\' 2"  (1.88 m) 238 lb (107.956 kg)  09/15/12 1035 102/48 mmHg - - 60 - 96 % - -   4:14 PM Reevaluation with update and discussion. After initial assessment and treatment, an updated evaluation reveals He is feeling better. Vitals are reassuring . Gabreal Worton L    4:34 PM-Consult complete with Dr Rito Ehrlich. Patient case explained and discussed. He agrees to admit patient for further evaluation and treatment. Call ended at 16:37  Labs Reviewed  CBC - Abnormal; Notable  for the following:    WBC 13.2 (*)    RDW 16.1 (*)    All other components within normal limits  BASIC METABOLIC PANEL - Abnormal; Notable for the following:    Creatinine, Ser 1.66 (*)    GFR calc non Af Amer 37 (*)    GFR calc Af Amer 42 (*)    All other components within normal limits  URINE CULTURE  CULTURE, BLOOD (ROUTINE X 2)  CULTURE, BLOOD (ROUTINE X 2)  URINALYSIS, ROUTINE W REFLEX MICROSCOPIC  POCT I-STAT TROPONIN I  CG4 I-STAT (LACTIC ACID)   Dg Chest Port 1 View  09/15/2012  *RADIOLOGY REPORT*  Clinical Data: 79 year old male weakness.  Pacemaker.  Chronic pulmonary disease.  PORTABLE CHEST - 1 VIEW  Comparison: 05/16/2012 and earlier.  Findings: Portable AP semi upright view at 1141 hours.  New widespread patchy and confluent left lung opacity, including left upper lobe involvement.  Interval decreased right upper lobe airspace disease and increased density along the right minor fissure. Stable cardiomegaly and mediastinal contours.  Visualized tracheal air column is within normal limits.  Stable right chest cardiac pacemaker.  Sequelae of CABG.  IMPRESSION: 1.  New left lung airspace disease suspicious for multilobar pneumonia. 2.  Interval decreased right upper lobe airspace disease and  thickening along the right minor fissure. 3.  Asymmetric pulmonary edema is a less likely consideration in this setting.   Original Report Authenticated By: Erskine Speed, M.D.      1. Community acquired pneumonia   2. Weakness       MDM  Weakness, likely secondary to pneumonia. He has relative hypotension, likely related to nutritional deficit combined with medical treatment, for hypertension. Doubt sepsis, bacteremia, serious metabolic illness. His multiple comorbidities, and will require admission for stabilization.    Nursing Notes Reviewed/ Care Coordinated, and agree without changes. Applicable Imaging Reviewed.  Interpretation of Laboratory Data incorporated into ED  treatment   Plan: Admit   Flint Melter, MD 09/15/12 [phone]

## 2012-09-15 NOTE — ED Notes (Signed)
Pt states that he woke up about 4 am and was feeling weak and having chills.  Lives with son

## 2012-09-15 NOTE — H&P (Addendum)
[facility] History and Physical  Richard Chavez ION:[id number] DOB: 23-Jul-1926 DOA: 09/15/2012  Referring physician: Harden Chavez, ER physician PCP: Richard Carrow, MD  Specialists: None, although will notify [facility] cardiology the patient is going to be admitted  Chief Complaint: Shortness of breath  HPI: Richard Chavez is a 79 y.o. male  With past medical history of COPD, CAD, systolic/diastolic heart failure and chronic renal failure who has been in his usual state of health and then this morning started complaining of feeling very weak and having chills. He does complain of some mild shortness of breath. His son brought him in. In the emergency room he was noted to have elevated white blood cell count of 13. His renal function was stable at 1.6. Chest x-ray showed some questionable edema also a focal left lower lobe infiltrate. Patient was assumed to have a community-acquired pneumonia. He was put on some oxygen, given breathing treatments and antibiotics. Hospitalists were called for further evaluation. In review of patient's record, I saw that he has a history of advanced systolic and diastolic heart failure have ordered a stat BNP which is pending at time of this dictation.  Review of Systems: Patient seen down in the emergency room. He complains of generalized fatigue, shortness of breath although improved, wheezing and nonproductive cough. Denies any headaches, vision changes, dysphasia, chest pain or palpitations. Denies abdominal pain, hematuria, dysuria, constipation, diarrhea, focal extremity numbness or weakness or pain. Review of systems otherwise negative.  Past Medical History  Diagnosis Date  . CAD (coronary artery disease)   . Hypertension   . Hyperlipidemia   . GERD (gastroesophageal reflux disease)   . COPD (chronic obstructive pulmonary disease)   . CRF (chronic renal failure)   . Barrett's esophagus   . OP (osteoporosis)   . CAD (coronary artery disease) of  artery bypass graft 07/04/2011    LIMA-LAD; SVG-OM2-OM3, SVG-RCA -- all patent as of 05/27/11  . Syncope 07/04/2011  . Pacemaker 07/04/2011  . Cardiomyopathy, ischemic 07/07/2011    2D Echo - EF 35-40, moderately dilated left atrium  . Myocardial infarction     25 yrs ago  . Peripheral vascular disease   . Arthritis   . BPH (benign prostatic hyperplasia)   . Polyp of colon     removed  . Sleep apnea     STOP BANG SCORE 4  . AV block, 3rd degree     Post St. Jude pacemaker, EF 35-40, does have intermittent atrial tachycardia, either PAt or Afib  . BPH (benign prostatic hypertrophy) 11/15/2011  . SOB (shortness of breath) on exertion 02/14/2010    2D Echo - EF 35-45,    Past Surgical History  Procedure Laterality Date  . Pacemaker placement  2007  . Cholecystectomy  2005  . Coronary artery bypass graft  1978  . Lesion excision      from penis  . Cardiac catheterization  06/06/2011  . Cardiac catheterization  06/14/2010    LIMA to LAD, SVG to RCA, SVG to OM1 Circumflex, 100% occluded RCA, 100% occluded circumfkex, 100% occluded LAD, no evidence of graft dysfunction, continue medical therapy  . Cardiac catheterization  04/12/2003    3 vessel coronary artery disease, continue medical treatment  . Cardiac catheterization  10/07/2000    Severe 3 vessel coronary artery disease, continue medical treatment   Social History:  reports that he quit smoking about 27 years ago. His smoking use included Cigarettes. He has a 4.5 pack-year smoking history. He  has never used smokeless tobacco. He reports that he does not drink alcohol or use illicit drugs. Patient lives at home with his son. He is normally able to ambulate with some assistance using a walker  No Known Allergies  Family History  Problem Relation Age of Onset  . Leukemia Father   . Heart disease Father   . Alzheimer's disease Mother   . Arthritis Mother   . Lung cancer Son 103  . Alzheimer's disease Maternal Grandmother     Prior to  Admission medications   Medication Sig Start Date End Date Taking? Authorizing Provider  albuterol-ipratropium (COMBIVENT) 18-103 MCG/ACT inhaler Inhale 2 puffs into the lungs 2 (two) times daily.    Yes Historical Provider, MD  atorvastatin (LIPITOR) 20 MG tablet Take 20 mg by mouth daily with breakfast.    Yes Historical Provider, MD  fluticasone (FLONASE) 50 MCG/ACT nasal spray Place 2 sprays into the nose 2 (two) times daily as needed. For dryness. 03/09/11  Yes Historical Provider, MD  Fluticasone-Salmeterol (ADVAIR DISKUS) 500-50 MCG/DOSE AEPB Inhale 1 puff into the lungs every 12 (twelve) hours.    Yes Historical Provider, MD  furosemide (LASIX) 40 MG tablet Take 1 tablet (40 mg total) by mouth daily with breakfast. 05/23/12  Yes Richard Pia, MD  gabapentin (NEURONTIN) 300 MG capsule Take 600 mg by mouth 3 (three) times daily.  02/07/11  Yes Historical Provider, MD  guaiFENesin (MUCINEX) 600 MG 12 hr tablet Take 1,200 mg by mouth 2 (two) times daily.   Yes Historical Provider, MD  loratadine (CLARITIN) 10 MG tablet Take 10 mg by mouth daily with breakfast.    Yes Historical Provider, MD  Magnesium Chloride (SLOW-MAG) 535 (64 MG) MG TBCR Take 1 tablet by mouth 2 (two) times daily.    Yes Historical Provider, MD  metoprolol tartrate (LOPRESSOR) 25 MG tablet Take 25 mg by mouth 2 (two) times daily.   Yes Historical Provider, MD  Omega-3 Fatty Acids (FISH OIL) 1000 MG CAPS Take 1 capsule by mouth daily.    Yes Historical Provider, MD  potassium chloride (K-DUR) 10 MEQ tablet Take 10 mEq by mouth 2 (two) times daily.    Yes Historical Provider, MD  predniSONE (DELTASONE) 5 MG tablet Take 5 mg by mouth daily.   Yes Historical Provider, MD  ranitidine (ZANTAC) 150 MG capsule Take 150 mg by mouth 2 (two) times daily.    Yes Historical Provider, MD  tiotropium (SPIRIVA) 18 MCG inhalation capsule Place 18 mcg into inhaler and inhale daily with breakfast.    Yes Historical Provider, MD  Vitamin D,  Ergocalciferol, (DRISDOL) 50000 UNITS CAPS Take 50,000 Units by mouth every 7 (seven) days. Take on Fridays   Yes Historical Provider, MD  vitamin E 1000 UNIT capsule Take 1,000 Units by mouth daily with breakfast.    Yes Historical Provider, MD   Physical Exam: Filed Vitals:   09/15/12 1116 09/15/12 1155 09/15/12 1205 09/15/12 1601  BP: 106/35 109/44  111/44  Pulse: 89     Temp: 99.2 F (37.3 C)  101.2 F (38.4 C) 98.6 F (37 C)  TempSrc: Oral  Rectal Oral  Resp:    18  Height: 6\' 2"  (1.88 m)     Weight: 107.956 kg (238 lb)     SpO2: 95%   95%     General:  Alert and oriented x3, no acute distress, fatigued, looks about stated age  Eyes: Sclera nonicteric, extraocular movements are intact  ENT: Normocephalic, atraumatic,  mucous membranes are slightly dry  Neck: Thick, no carotid bruits, no visible JVD  Cardiovascular: Regular rate and rhythm, S1-S2, 2/6 systolic ejection murmur  Respiratory: Decreased breath sounds bilaterally, more so at the bases, mild end expiratory wheeze bilaterally  Abdomen: Soft, quite distended, obese, nontender, hypoactive bowel sounds  Skin: No skin breaks, tears or lesions  Musculoskeletal: No clubbing or cyanosis, trace to 1+ pitting edema from the knees down bilaterally  Psychiatric: Patient is appropriate no evidence of psychoses  Neurologic: No overt deficits  Labs on Admission:  Basic Metabolic Panel:  Recent Labs Lab 09/15/12 1130  NA 137  K 3.6  CL 100  CO2 26  GLUCOSE 85  BUN 20  CREATININE 1.66*  CALCIUM 9.0   CBC:  Recent Labs Lab 09/15/12 1130  WBC 13.2*  HGB 14.2  HCT 42.4  MCV 86.0  PLT 168   I-STAT troponin I initial set normal  BNP (last 3 results)  Recent Labs  05/16/12 1305  PROBNP 2313.0*   BNP ordered and is pending  Radiological Exams on Admission: Dg Chest Port 1 View  09/15/2012   IMPRESSION: 1.  New left lung airspace disease suspicious for multilobar pneumonia. 2.  Interval decreased  right upper lobe airspace disease and thickening along the right minor fissure. 3.  Asymmetric pulmonary edema is a less likely consideration in this setting.   Original Report Authenticated By: Erskine Speed, M.D.     EKG: Independently reviewed. Paced rhythm,  Assessment/Plan Active Problems:   Hypertension: Blood pressure little soft, likely from dehydration.    Hypercholesteremia: Continue statin.    COPD (chronic obstructive pulmonary disease)   Edema of both legs: Some component of chronic and some from heart failure. Awaiting BNP.    PNA (pneumonia): Given elevated white blood cell count and symptoms of shortness of breath with history of COPD, highly suspicious for community-acquired pneumonia. Antibiotics started in the emergency room. Also may be some component of heart failure as well. Continue oxygen 4+ nebulizers. No steroids at this time.    Chronic combined systolic and diastolic congestive heart failure: Suspect some component likely congestive heart failure. Ordered a stat BNP in the emergency room and is pending.    CKD (chronic kidney disease) stage 2, GFR 60-89 ml/min: Creatinine minimally elevated, with normal BUN. Mild dehydration. Await BNP for hydrating.    OSA (obstructive sleep apnea): Nighttime CPAP.     Code Status: Full code  Family Communication: Plan discussed with patient and his son the bedside  Disposition Plan: Home in a few days  Time spent: 35 minutes  Hollice Espy [facility] Pager [phone]  If 7PM-7AM, please contact night-coverage [email or web address] Password [facility] 09/15/2012, 6:05 PM

## 2012-09-16 ENCOUNTER — Encounter (HOSPITAL_COMMUNITY): Payer: Self-pay | Admitting: Cardiology

## 2012-09-16 ENCOUNTER — Ambulatory Visit: Payer: Medicare Other | Admitting: Cardiology

## 2012-09-16 DIAGNOSIS — I5042 Chronic combined systolic (congestive) and diastolic (congestive) heart failure: Secondary | ICD-10-CM

## 2012-09-16 DIAGNOSIS — R0602 Shortness of breath: Secondary | ICD-10-CM

## 2012-09-16 DIAGNOSIS — I509 Heart failure, unspecified: Secondary | ICD-10-CM

## 2012-09-16 DIAGNOSIS — I4949 Other premature depolarization: Secondary | ICD-10-CM

## 2012-09-16 DIAGNOSIS — J449 Chronic obstructive pulmonary disease, unspecified: Secondary | ICD-10-CM

## 2012-09-16 DIAGNOSIS — I10 Essential (primary) hypertension: Secondary | ICD-10-CM

## 2012-09-16 DIAGNOSIS — J189 Pneumonia, unspecified organism: Principal | ICD-10-CM

## 2012-09-16 LAB — EXPECTORATED SPUTUM ASSESSMENT W GRAM STAIN, RFLX TO RESP C

## 2012-09-16 LAB — BASIC METABOLIC PANEL
CO2: 24 mEq/L (ref 19–32)
Chloride: 99 mEq/L (ref 96–112)
Glucose, Bld: 77 mg/dL (ref 70–99)
Potassium: 3.9 mEq/L (ref 3.5–5.1)
Sodium: 132 mEq/L — ABNORMAL LOW (ref 135–145)

## 2012-09-16 LAB — CBC
HCT: 38.1 % — ABNORMAL LOW (ref 39.0–52.0)
Hemoglobin: 12.4 g/dL — ABNORMAL LOW (ref 13.0–17.0)
MCH: 28.2 pg (ref 26.0–34.0)
MCV: 86.8 fL (ref 78.0–100.0)
Platelets: 149 10*3/uL — ABNORMAL LOW (ref 150–400)
RBC: 4.39 MIL/uL (ref 4.22–5.81)
WBC: 16.2 10*3/uL — ABNORMAL HIGH (ref 4.0–10.5)

## 2012-09-16 LAB — LEGIONELLA ANTIGEN, URINE

## 2012-09-16 LAB — STREP PNEUMONIAE URINARY ANTIGEN: Strep Pneumo Urinary Antigen: NEGATIVE

## 2012-09-16 LAB — URINE CULTURE

## 2012-09-16 LAB — MRSA PCR SCREENING: MRSA by PCR: NEGATIVE

## 2012-09-16 MED ORDER — SODIUM CHLORIDE 0.9 % IJ SOLN
3.0000 mL | Freq: Two times a day (BID) | INTRAMUSCULAR | Status: DC
  Administered 2012-09-16 – 2012-09-17 (×2): 3 mL via INTRAVENOUS

## 2012-09-16 MED ORDER — ONDANSETRON HCL 4 MG/2ML IJ SOLN
4.0000 mg | Freq: Four times a day (QID) | INTRAMUSCULAR | Status: DC | PRN
  Administered 2012-09-16: 4 mg via INTRAVENOUS
  Filled 2012-09-16: qty 2

## 2012-09-16 MED ORDER — ACETAMINOPHEN 325 MG PO TABS: 650.0000 mg | ORAL_TABLET | ORAL | Status: DC | PRN

## 2012-09-16 NOTE — Progress Notes (Addendum)
Physical Therapy Evaluation Patient Details Name: Richard Chavez MRN: [id number] DOB: [birth year] Today's Date: 09/16/2012 Time: 4098-1191 PT Time Calculation (min): 28 min  PT Assessment / Plan / Recommendation Comments on Treatment Session  79 yo male admitted with pna. Pt lives with son-states son is always with him. On eval, pt required Min assist for mobility-able to ambulate ~30 feet with RW. Recommend HHPT vs no follow up depending on progress. Pt has all DME    Follow Up Recommendations  Home health PT;No PT follow up (depending on progress)     Does the patient have the potential to tolerate intense rehabilitation     Barriers to Discharge        Equipment Recommendations  None recommended by PT    Recommendations for Other Services OT consult  Frequency Min 3X/week   Plan Discharge plan remains appropriate    Precautions / Restrictions Precautions Precautions: Fall Restrictions Weight Bearing Restrictions: No   Pertinent Vitals/Pain No c/o pain O2 sats 93% on RA with activity    Mobility  Bed Mobility Bed Mobility: Supine to Sit Supine to Sit: 4: Min assist;HOB elevated;With rails Details for Bed Mobility Assistance: Assist for trunk to upright. Pt used therapist's hand to pull up on.  Transfers Transfers: Sit to Stand;Stand to Sit Sit to Stand: 4: Min assist;From bed;From toilet Stand to Sit: 4: Min assist;To chair/3-in-1;To toilet Details for Transfer Assistance: VCs safety, technique, hand placement. Assist to rise, stabilize, control descent Ambulation/Gait Ambulation/Gait Assistance: 4: Min assist Ambulation Distance (Feet): 30 Feet (15'x2) Assistive device: Rolling walker Ambulation/Gait Assistance Details: VCs safety. Assist to stabilize intermittently.  Gait Pattern: Step-through pattern;Decreased stride length    Exercises     PT Diagnosis:    PT Problem List:   PT Treatment Interventions:     PT Goals Acute Rehab PT Goals PT Goal Formulation:  With patient Time For Goal Achievement: 09/30/12 Potential to Achieve Goals: Good Pt will go Supine/Side to Sit: with supervision PT Goal: Supine/Side to Sit - Progress: Goal set today Pt will go Sit to Supine/Side: with supervision PT Goal: Sit to Supine/Side - Progress: Goal set today Pt will go Sit to Stand: with supervision PT Goal: Sit to Stand - Progress: Goal set today Pt will Ambulate: 51 - 150 feet;with supervision;with least restrictive assistive device PT Goal: Ambulate - Progress: Goal set today  Visit Information  Last PT Received On: 09/16/12 Assistance Needed: +1    Subjective Data  Subjective: My son is with me all the time Patient Stated Goal: home   Cognition  Cognition Arousal/Alertness: Awake/alert Behavior During Therapy: WFL for tasks assessed/performed Overall Cognitive Status: Within Functional Limits for tasks assessed    Balance     End of Session PT - End of Session Activity Tolerance: Patient tolerated treatment well Patient left: in chair;with call bell/phone within reach   GP     Rebeca Alert, MPT Pager: [phone]

## 2012-09-16 NOTE — Consult Note (Signed)
Reason for Consult: edema, SOB   Referring Physician: Dr. Rito Ehrlich PCP: Dorcas Carrow, MD Primary Cardiologist:Dr. Herbie Baltimore Pacemaker:  Dr. Thurston Pounds Richard Chavez is an 79 y.o. male.    Chief Complaint:  Admitted 09/15/12 with SOB  HPI: 79 y.o. male with past medical history of COPD, CAD, systolic/diastolic heart failure and chronic renal failure who has been in his usual state of health and then the  morning of admit tarted complaining of feeling very weak and having chills. He did complain of some mild shortness of breath. His son brought him in.  In the emergency room he was noted to have elevated white blood cell count of 13. His renal function was stable at 1.6. Chest x-ray showed some questionable edema also a focal left lower lobe infiltrate. Patient was assumed to have a community-acquired pneumonia. He was put on some oxygen, given breathing treatments and antibiotics. Hospitalists were called for further evaluation. In review of patient's record, I saw that he has a history of advanced systolic and diastolic heart failure and Pro BNP on admit was 1760. Negative troponin and EKG with A. Pacing and ventricular sensing, and PVCs.    Tele with freq PVCs and extra beats between atrial and ventricular pacing.  Blood cultures pending.     Cardiac history with ICM with EF 35-40% per echo 3/13.  Hx of CABG with most recent cath 05/2011 with patent VGrafts and patent LIMA.  Hx of transient 3rd degree block with pacemaker placed.   Pacer last checked 07/31/12.  Past Medical History  Diagnosis Date  . CAD (coronary artery disease)   . Hypertension   . Hyperlipidemia   . GERD (gastroesophageal reflux disease)   . COPD (chronic obstructive pulmonary disease)   . CRF (chronic renal failure)   . Barrett's esophagus   . OP (osteoporosis)   . CAD (coronary artery disease) of artery bypass graft 07/04/2011    LIMA-LAD; SVG-OM2-OM3, SVG-RCA -- all patent as of 05/27/11  . Syncope 07/04/2011  .  Pacemaker 07/04/2011  . Cardiomyopathy, ischemic 07/07/2011    2D Echo - EF 35-40, moderately dilated left atrium  . Myocardial infarction     25 yrs ago  . Peripheral vascular disease   . Arthritis   . BPH (benign prostatic hyperplasia)   . Polyp of colon     removed  . Sleep apnea     STOP BANG SCORE 4  . AV block, 3rd degree     Post St. Jude pacemaker, EF 35-40, does have intermittent atrial tachycardia, either PAt or Afib  . BPH (benign prostatic hypertrophy) 11/15/2011  . SOB (shortness of breath) on exertion 02/14/2010    2D Echo - EF 35-45,     Past Surgical History  Procedure Laterality Date  . Pacemaker placement  2007  . Cholecystectomy  2005  . Coronary artery bypass graft  1978  . Lesion excision      from penis  . Cardiac catheterization  06/06/2011  . Cardiac catheterization  06/14/2010    LIMA to LAD, SVG to RCA, SVG to OM1 Circumflex, 100% occluded RCA, 100% occluded circumfkex, 100% occluded LAD, no evidence of graft dysfunction, continue medical therapy  . Cardiac catheterization  04/12/2003    3 vessel coronary artery disease, continue medical treatment  . Cardiac catheterization  10/07/2000    Severe 3 vessel coronary artery disease, continue medical treatment    Family History  Problem Relation Age of Onset  . Leukemia Father   .  Heart disease Father   . Alzheimer's disease Mother   . Arthritis Mother   . Lung cancer Son 70  . Alzheimer's disease Maternal Grandmother    Social History:  reports that he quit smoking about 27 years ago. His smoking use included Cigarettes. He has a 4.5 pack-year smoking history. He has never used smokeless tobacco. He reports that he does not drink alcohol or use illicit drugs.  Allergies: No Known Allergies  Medications Prior to Admission  Medication Sig Dispense Refill  . albuterol-ipratropium (COMBIVENT) 18-103 MCG/ACT inhaler Inhale 2 puffs into the lungs 2 (two) times daily.       Marland Kitchen atorvastatin (LIPITOR) 20 MG tablet  Take 20 mg by mouth daily with breakfast.       . fluticasone (FLONASE) 50 MCG/ACT nasal spray Place 2 sprays into the nose 2 (two) times daily as needed. For dryness.      . Fluticasone-Salmeterol (ADVAIR DISKUS) 500-50 MCG/DOSE AEPB Inhale 1 puff into the lungs every 12 (twelve) hours.       . furosemide (LASIX) 40 MG tablet Take 1 tablet (40 mg total) by mouth daily with breakfast.  30 tablet    . gabapentin (NEURONTIN) 300 MG capsule Take 600 mg by mouth 3 (three) times daily.       Marland Kitchen guaiFENesin (MUCINEX) 600 MG 12 hr tablet Take 1,200 mg by mouth 2 (two) times daily.      Marland Kitchen loratadine (CLARITIN) 10 MG tablet Take 10 mg by mouth daily with breakfast.       . Magnesium Chloride (SLOW-MAG) 535 (64 MG) MG TBCR Take 1 tablet by mouth 2 (two) times daily.       . metoprolol tartrate (LOPRESSOR) 25 MG tablet Take 25 mg by mouth 2 (two) times daily.      . Omega-3 Fatty Acids (FISH OIL) 1000 MG CAPS Take 1 capsule by mouth daily.       . potassium chloride (K-DUR) 10 MEQ tablet Take 10 mEq by mouth 2 (two) times daily.       . predniSONE (DELTASONE) 5 MG tablet Take 5 mg by mouth daily.      . ranitidine (ZANTAC) 150 MG capsule Take 150 mg by mouth 2 (two) times daily.       Marland Kitchen tiotropium (SPIRIVA) 18 MCG inhalation capsule Place 18 mcg into inhaler and inhale daily with breakfast.       . Vitamin D, Ergocalciferol, (DRISDOL) 50000 UNITS CAPS Take 50,000 Units by mouth every 7 (seven) days. Take on Fridays      . vitamin E 1000 UNIT capsule Take 1,000 Units by mouth daily with breakfast.         Results for orders placed during the hospital encounter of 09/15/12 (from the past 48 hour(s))  CBC     Status: Abnormal   Collection Time    09/15/12 11:30 AM      Result Value Range   WBC 13.2 (*) 4.0 - 10.5 K/uL   RBC 4.93  4.22 - 5.81 MIL/uL   Hemoglobin 14.2  13.0 - 17.0 g/dL   HCT 65.7  84.6 - 96.2 %   MCV 86.0  78.0 - 100.0 fL   MCH 28.8  26.0 - 34.0 pg   MCHC 33.5  30.0 - 36.0 g/dL   RDW  95.2 (*) 84.1 - 15.5 %   Platelets 168  150 - 400 K/uL  BASIC METABOLIC PANEL     Status: Abnormal   Collection Time  09/15/12 11:30 AM      Result Value Range   Sodium 137  135 - 145 mEq/L   Potassium 3.6  3.5 - 5.1 mEq/L   Chloride 100  96 - 112 mEq/L   CO2 26  19 - 32 mEq/L   Glucose, Bld 85  70 - 99 mg/dL   BUN 20  6 - 23 mg/dL   Creatinine, Ser 4.09 (*) 0.50 - 1.35 mg/dL   Calcium 9.0  8.4 - 81.1 mg/dL   GFR calc non Af Amer 37 (*) >90 mL/min   GFR calc Af Amer 42 (*) >90 mL/min   Comment:            The eGFR has been calculated     using the CKD EPI equation.     This calculation has not been     validated in all clinical     situations.     eGFR's persistently     <90 mL/min signify     possible Chronic Kidney Disease.  PRO B NATRIURETIC PEPTIDE     Status: Abnormal   Collection Time    09/15/12 11:30 AM      Result Value Range   Pro B Natriuretic peptide (BNP) 1760.0 (*) 0 - 450 pg/mL  POCT I-STAT TROPONIN I     Status: None   Collection Time    09/15/12 11:41 AM      Result Value Range   Troponin i, poc 0.00  0.00 - 0.08 ng/mL   Comment 3            Comment: Due to the release kinetics of cTnI,     a negative result within the first hours     of the onset of symptoms does not rule out     myocardial infarction with certainty.     If myocardial infarction is still suspected,     repeat the test at appropriate intervals.  CULTURE, BLOOD (ROUTINE X 2)     Status: None   Collection Time    09/15/12  1:40 PM      Result Value Range   Specimen Description BLOOD LEFT ANTECUBITAL     Special Requests BOTTLES DRAWN AEROBIC AND ANAEROBIC 3CC     Culture  Setup Time 09/15/2012 22:38     Culture       Value:        BLOOD CULTURE RECEIVED NO GROWTH TO DATE CULTURE WILL BE HELD FOR 5 DAYS BEFORE ISSUING A FINAL NEGATIVE REPORT   Report Status PENDING    CULTURE, BLOOD (ROUTINE X 2)     Status: None   Collection Time    09/15/12  1:45 PM      Result Value Range    Specimen Description BLOOD LEFT FOREARM     Special Requests BOTTLES DRAWN AEROBIC ONLY 5CC     Culture  Setup Time 09/15/2012 22:38     Culture       Value:        BLOOD CULTURE RECEIVED NO GROWTH TO DATE CULTURE WILL BE HELD FOR 5 DAYS BEFORE ISSUING A FINAL NEGATIVE REPORT   Report Status PENDING    CG4 I-STAT (LACTIC ACID)     Status: None   Collection Time    09/15/12  1:59 PM      Result Value Range   Lactic Acid, Venous 1.76  0.5 - 2.2 mmol/L  URINALYSIS, ROUTINE W REFLEX MICROSCOPIC     Status: None  Collection Time    09/15/12  2:42 PM      Result Value Range   Color, Urine YELLOW  YELLOW   APPearance CLEAR  CLEAR   Specific Gravity, Urine 1.016  1.005 - 1.030   pH 6.0  5.0 - 8.0   Glucose, UA NEGATIVE  NEGATIVE mg/dL   Hgb urine dipstick NEGATIVE  NEGATIVE   Bilirubin Urine NEGATIVE  NEGATIVE   Ketones, ur NEGATIVE  NEGATIVE mg/dL   Protein, ur NEGATIVE  NEGATIVE mg/dL   Urobilinogen, UA 0.2  0.0 - 1.0 mg/dL   Nitrite NEGATIVE  NEGATIVE   Leukocytes, UA NEGATIVE  NEGATIVE   Comment: MICROSCOPIC NOT DONE ON URINES WITH NEGATIVE PROTEIN, BLOOD, LEUKOCYTES, NITRITE, OR GLUCOSE <1000 mg/dL.  URINE CULTURE     Status: None   Collection Time    09/15/12  2:42 PM      Result Value Range   Specimen Description URINE, CATHETERIZED     Special Requests NONE     Culture  Setup Time 09/15/2012 23:04     Colony Count NO GROWTH     Culture NO GROWTH     Report Status 09/16/2012 FINAL    LEGIONELLA ANTIGEN, URINE     Status: None   Collection Time    09/16/12  4:43 AM      Result Value Range   Specimen Description URINE, CLEAN CATCH     Special Requests NONE     Legionella Antigen, Urine Negative for Legionella pneumophilia serogroup 1     Report Status 09/16/2012 FINAL    STREP PNEUMONIAE URINARY ANTIGEN     Status: None   Collection Time    09/16/12  4:43 AM      Result Value Range   Strep Pneumo Urinary Antigen NEGATIVE  NEGATIVE   Comment:            Infection due  to S. pneumoniae     cannot be absolutely ruled out     since the antigen present     may be below the detection limit     of the test.  BASIC METABOLIC PANEL     Status: Abnormal   Collection Time    09/16/12  4:50 AM      Result Value Range   Sodium 132 (*) 135 - 145 mEq/L   Potassium 3.9  3.5 - 5.1 mEq/L   Chloride 99  96 - 112 mEq/L   CO2 24  19 - 32 mEq/L   Glucose, Bld 77  70 - 99 mg/dL   BUN 22  6 - 23 mg/dL   Creatinine, Ser 1.61 (*) 0.50 - 1.35 mg/dL   Calcium 8.0 (*) 8.4 - 10.5 mg/dL   GFR calc non Af Amer 38 (*) >90 mL/min   GFR calc Af Amer 44 (*) >90 mL/min   Comment:            The eGFR has been calculated     using the CKD EPI equation.     This calculation has not been     validated in all clinical     situations.     eGFR's persistently     <90 mL/min signify     possible Chronic Kidney Disease.  CBC     Status: Abnormal   Collection Time    09/16/12  4:50 AM      Result Value Range   WBC 16.2 (*) 4.0 - 10.5 K/uL   RBC 4.39  4.22 -  5.81 MIL/uL   Hemoglobin 12.4 (*) 13.0 - 17.0 g/dL   HCT 69.6 (*) 29.5 - 28.4 %   MCV 86.8  78.0 - 100.0 fL   MCH 28.2  26.0 - 34.0 pg   MCHC 32.5  30.0 - 36.0 g/dL   RDW 13.2 (*) 44.0 - 10.2 %   Platelets 149 (*) 150 - 400 K/uL  MRSA PCR SCREENING     Status: None   Collection Time    09/16/12  8:02 AM      Result Value Range   MRSA by PCR NEGATIVE  NEGATIVE   Comment:            The GeneXpert MRSA Assay (FDA     approved for NASAL specimens     only), is one component of a     comprehensive MRSA colonization     surveillance program. It is not     intended to diagnose MRSA     infection nor to guide or     monitor treatment for     MRSA infections.  CULTURE, EXPECTORATED SPUTUM-ASSESSMENT     Status: None   Collection Time    09/16/12 10:08 AM      Result Value Range   Specimen Description SPUTUM     Special Requests NONE     Sputum evaluation       Value: THIS SPECIMEN IS ACCEPTABLE. RESPIRATORY CULTURE  REPORT TO FOLLOW.   Report Status 09/16/2012 FINAL     Dg Chest Port 1 View  09/15/2012  *RADIOLOGY REPORT*  Clinical Data: 79 year old male weakness.  Pacemaker.  Chronic pulmonary disease.  PORTABLE CHEST - 1 VIEW  Comparison: 05/16/2012 and earlier.  Findings: Portable AP semi upright view at 1141 hours.  New widespread patchy and confluent left lung opacity, including left upper lobe involvement.  Interval decreased right upper lobe airspace disease and increased density along the right minor fissure. Stable cardiomegaly and mediastinal contours.  Visualized tracheal air column is within normal limits.  Stable right chest cardiac pacemaker.  Sequelae of CABG.  IMPRESSION: 1.  New left lung airspace disease suspicious for multilobar pneumonia. 2.  Interval decreased right upper lobe airspace disease and thickening along the right minor fissure. 3.  Asymmetric pulmonary edema is a less likely consideration in this setting.   Original Report Authenticated By: Erskine Speed, M.D.     ROS: General:no colds or fevers, no weight changes Skin:no rashes or ulcers HEENT:no blurred vision, no congestion CV:see HPI-- no chest pain PUL:see HPI GI:no diarrhea constipation or melena, no indigestion GU:no hematuria, no dysuria MS:no joint pain, no claudication Neuro:no syncope, no lightheadedness Endo:no diabetes, no thyroid disease   Blood pressure 120/51, pulse 68, temperature 98.7 F (37.1 C), temperature source Oral, resp. rate 18, height 6\' 2"  (1.88 m), weight 250 lb 4.8 oz (113.535 kg), SpO2 96.00%. PE: General:alert and oriented, pleasant affect, resting comfortably, NAD  Skin:warm and dry, brisk capillary refill, small petechial bruising on upper ext. HEENT:normocephalic, sclera clear Neck:supple, no JVD,no bruits Heart:S1S2 RRR Lungs:diminished breath sounds in bases but otherwise clear  Abd:+ BS, soft, non tender Ext:no edema, 1 + pedal pulses bil.  Neuro:alert and oriented X 3, MAE, follows  commands.    Assessment/Plan Active Problems:   Hypertension   Hypercholesteremia   COPD (chronic obstructive pulmonary disease)   Edema of both legs   PNA (pneumonia)   Chronic combined systolic and diastolic congestive heart failure   OSA (obstructive sleep apnea)  Chronic kidney disease, stage III (moderate)  PLAN:pt looks better than his usual.  No edema, improved with antibiotics.  No complaints currently.  Continue to monitor.  Leone Brand  Nurse Practitioner Certified [facility] Pager [phone] 09/16/2012, 6:56 PM  I have seen and examined the patient along with Nada Boozer, NP.  I have reviewed the chart, notes and new data.  I agree with NP's note.  Key new complaints: Clinical presentation typical for bacterial pneumonia Key examination changes: no overt signs of CHF, appears euvolemic Key new findings / data: frequent PVCs with "safety pacing" noted. I think device is functioning normally, but will interrogate to make sure.  PLAN: Will monitor from a distance unless we identify a problem with pacemaker function. Thank you for letting us know he was admitted.  Thurmon Fair, MD, [facility] [phone] 09/16/2012, 7:47 PM

## 2012-09-16 NOTE — Progress Notes (Signed)
CCMD called to notify that pt was having bigeminal PVC's on monitor.  Pt lying in bed asleep, in NAD. Will continue to monitor pt.

## 2012-09-16 NOTE — Progress Notes (Signed)
Pt having runs of PVC's on heart monitor. Pt in NAD, lying in bed, family at bedside.   Will continue to monitor pt throughout shift.

## 2012-09-16 NOTE — Progress Notes (Signed)
[facility] PROGRESS NOTE  Richard Chavez JYN:[id number] DOB: 05/10/1926 DOA: 09/15/2012 PCP: Dorcas Carrow, MD  Brief narrative 79 y.o. male with past medical history of COPD, CAD, systolic/diastolic heart failure and chronic renal failure who has been in his usual state of health until 5/12 am when he started complaining of feeling very weak and having chills. He does complain of some mild shortness of breath. His son brought him in. In the emergency room he was noted to have elevated white blood cell count of 13. His renal function was stable at 1.6. Chest x-ray showed some questionable edema also a focal left lower lobe infiltrate. Patient was assumed to have a community-acquired pneumonia. He was put on some oxygen, given breathing treatments and antibiotics. Hospitalists were called for further evaluation.   Assessment/Plan: 1. Community-acquired pneumonia: Continue IV Rocephin and azithromycin. 2. Chronic combined CHF:? mild decompensation-Improved. Continue Lasix. DC IVF. Secondary to ischemic cardiomyopathy with LVEF 35-40% 3. COPD: Stable. 4. Stage III chronic kidney disease: Stable. Follow BMP 5. Status post PPM/CAD/CABG: Stable 6. HTN: Controlled. 7. GERD  Code Status: Full Family Communication: Discussed with patient Disposition Plan: Remains inpatient. Discharge home when medically stable.   Consultants:  None  Procedures:  None  Antibiotics:  IV Rocephin 5/12 >  IV azithromycin 5/12 >   HPI/Subjective: Feels better. Denies dyspnea or chest pain. Generalized achiness.  Objective: Filed Vitals:   09/15/12 2300 09/15/12 2311 09/16/12 0442 09/16/12 0733  BP:  126/56 116/47   Pulse: 70 70 71   Temp:   98.2 F (36.8 C)   TempSrc:   Oral   Resp:   20   Height:      Weight:      SpO2:   95% 95%    Intake/Output Summary (Last 24 hours) at 09/16/12 0740 Last data filed at 09/16/12 0431  Gross per 24 hour  Intake 1070.83 ml  Output    255 ml  Net  815.83 ml   Filed Weights   09/15/12 1116 09/15/12 1815  Weight: 107.956 kg (238 lb) 113.535 kg (250 lb 4.8 oz)    Exam:   General exam: Comfortable. Sitting on reclining chair.  Respiratory system: Slightly reduced breath sounds in the bases but without crackles. Rest of lung fields clear to auscultation. No increased work of breathing.  Cardiovascular system: S1 & S2 heard, RRR. No JVD, murmurs, gallops, clicks. 1+ bilateral leg edema. Telemetry: Sinus rhythm/paced rhythm.  Gastrointestinal system: Abdomen is nondistended, soft and nontender. Normal bowel sounds heard.  Central nervous system: Alert and oriented. No focal neurological deficits.  Extremities: Symmetric 5 x 5 power.   Data Reviewed: Basic Metabolic Panel:  Recent Labs Lab 09/15/12 1130 09/16/12 0450  NA 137 132*  K 3.6 3.9  CL 100 99  CO2 26 24  GLUCOSE 85 77  BUN 20 22  CREATININE 1.66* 1.62*  CALCIUM 9.0 8.0*   Liver Function Tests: No results found for this basename: AST, ALT, ALKPHOS, BILITOT, PROT, ALBUMIN,  in the last 168 hours No results found for this basename: LIPASE, AMYLASE,  in the last 168 hours No results found for this basename: AMMONIA,  in the last 168 hours CBC:  Recent Labs Lab 09/15/12 1130 09/16/12 0450  WBC 13.2* 16.2*  HGB 14.2 12.4*  HCT 42.4 38.1*  MCV 86.0 86.8  PLT 168 149*   Cardiac Enzymes: No results found for this basename: CKTOTAL, CKMB, CKMBINDEX, TROPONINI,  in the last 168 hours BNP (last 3  results)  Recent Labs  05/16/12 1305 09/15/12 1130  PROBNP 2313.0* 1760.0*   CBG: No results found for this basename: GLUCAP,  in the last 168 hours  No results found for this or any previous visit (from the past 240 hour(s)).   Studies: Dg Chest Port 1 View  09/15/2012  *RADIOLOGY REPORT*  Clinical Data: 79 year old male weakness.  Pacemaker.  Chronic pulmonary disease.  PORTABLE CHEST - 1 VIEW  Comparison: 05/16/2012 and earlier.  Findings: Portable AP  semi upright view at 1141 hours.  New widespread patchy and confluent left lung opacity, including left upper lobe involvement.  Interval decreased right upper lobe airspace disease and increased density along the right minor fissure. Stable cardiomegaly and mediastinal contours.  Visualized tracheal air column is within normal limits.  Stable right chest cardiac pacemaker.  Sequelae of CABG.  IMPRESSION: 1.  New left lung airspace disease suspicious for multilobar pneumonia. 2.  Interval decreased right upper lobe airspace disease and thickening along the right minor fissure. 3.  Asymmetric pulmonary edema is a less likely consideration in this setting.   Original Report Authenticated By: Erskine Speed, M.D.      Additional labs:   Scheduled Meds: . atorvastatin  20 mg Oral q1800  . azithromycin  500 mg Oral Q24H  . cefTRIAXone (ROCEPHIN)  IV  1 g Intravenous Q24H  . enoxaparin (LOVENOX) injection  40 mg Subcutaneous Q24H  . famotidine  20 mg Oral Daily  . furosemide  40 mg Oral Daily  . gabapentin  600 mg Oral TID  . guaiFENesin  1,200 mg Oral BID  . loratadine  10 mg Oral Daily  . metoprolol tartrate  25 mg Oral BID  . mometasone-formoterol  2 puff Inhalation BID  . potassium chloride  10 mEq Oral BID  . predniSONE  5 mg Oral Q breakfast  . tiotropium  18 mcg Inhalation Daily   Continuous Infusions: . sodium chloride 125 mL/hr at 09/16/12 1914    Active Problems:   Hypertension   Hypercholesteremia   COPD (chronic obstructive pulmonary disease)   Edema of both legs   PNA (pneumonia)   Chronic combined systolic and diastolic congestive heart failure   CKD (chronic kidney disease) stage 2, GFR 60-89 ml/min   OSA (obstructive sleep apnea)    Time spent: 25 minutes    [facility]  [facility] Pager [phone].   If 8PM-8AM, please contact night-coverage at [email or web address], password [facility] 09/16/2012, 7:40 AM  LOS: 1 day

## 2012-09-16 NOTE — Care Management Note (Addendum)
    Page 1 of 1   09/18/2012     12:24:12 PM   CARE MANAGEMENT NOTE 09/18/2012  Patient:  Richard Chavez, Richard Chavez   Account Number:  [id number]  Date Initiated:  09/16/2012  Documentation initiated by:  Lanier Clam  Subjective/Objective Assessment:   ADMITTED W/SOB.PNA.     Action/Plan:   FROM HOME W/KIDS.HAS PCP,PHARMACY.   Anticipated DC Date:  09/17/2012   Anticipated DC Plan:  HOME/SELF CARE      DC Planning Services  CM consult      Choice offered to / List presented to:             Status of service:  Completed, signed off Medicare Important Message given?   (If response is "NO", the following Medicare IM given date fields will be blank) Date Medicare IM given:   Date Additional Medicare IM given:    Discharge Disposition:  HOME/SELF CARE  Per UR Regulation:  Reviewed for med. necessity/level of care/duration of stay  If discussed at Long Length of Stay Meetings, dates discussed:    Comments:  09/17/12 Mackensie Pilson RN,BSN NCM [phone] D/C HOME NO NEEDS.  09/16/12 Mileigh Tilley RN,BSN NCM [phone] PVC.

## 2012-09-17 LAB — BASIC METABOLIC PANEL
BUN: 18 mg/dL (ref 6–23)
Chloride: 102 mEq/L (ref 96–112)
GFR calc Af Amer: 47 mL/min — ABNORMAL LOW (ref >90)
Glucose, Bld: 74 mg/dL (ref 70–99)
Potassium: 4 mEq/L (ref 3.5–5.1)
Sodium: 135 mEq/L (ref 135–145)

## 2012-09-17 MED ORDER — LEVOFLOXACIN 750 MG PO TABS: 750.0000 mg | ORAL_TABLET | ORAL | Status: DC

## 2012-09-17 MED ORDER — DEXTROSE 5 % IV SOLN
1.0000 g | Freq: Once | INTRAVENOUS | Status: DC
  Filled 2012-09-17: qty 10

## 2012-09-17 NOTE — Progress Notes (Signed)
Pharmacist Heart Failure Core Measure Documentation  Assessment: Richard Chavez has an EF documented as 35% - 40% on 07/17/12 by 2D ECHO.  Rationale: Heart failure patients with left ventricular systolic dysfunction (LVSD) and an EF < 40% should be prescribed an angiotensin converting enzyme inhibitor (ACEI) or angiotensin receptor blocker (ARB) at discharge unless a contraindication is documented in the medical record.  This patient is not currently on an ACEI or ARB for HF.  This note is being placed in the record in order to provide documentation that a contraindication to the use of these agents is present for this encounter.  ACE Inhibitor or Angiotensin Receptor Blocker is contraindicated (specify all that apply)  []   ACEI allergy AND ARB allergy []   Angioedema []   Moderate or severe aortic stenosis []   Hyperkalemia []   Hypotension []   Renal artery stenosis [x]   Worsening renal function, preexisting renal disease or dysfunction   [name] 09/17/2012 8:24 AM

## 2012-09-17 NOTE — Progress Notes (Signed)
O2 sats on room air 90%. Richard Chavez

## 2012-09-17 NOTE — Progress Notes (Signed)
Pt has O2 sat of 92-95% on RA post ambulation in hallway. Maeola Harman

## 2012-09-17 NOTE — Discharge Summary (Signed)
Physician Discharge Summary  Richard Chavez ZOX:[id number] DOB: [birth year] DOA: 09/15/2012  PCP: Thayer Headings, MD Primary Cardiologist: Dr. [name] Lemma. Pacemaker: Dr. Rachelle Hora Croitoru   Admit date: 09/15/2012 Discharge date: 09/17/2012  Time spent: Greater than 30 minutes  Recommendations for Outpatient Follow-up:  1. Dr. Shary Decamp in 1 week with repeat labs (CBC & BMP) 2. Dr. [name] Lemma, Cardiology 3. Recommend repeat Chest X Ray in 2-3 weeks to ensure resolution on Pneumonia findings.  Discharge Diagnoses:  Active Problems:   Hypertension   Hypercholesteremia   COPD (chronic obstructive pulmonary disease)   Edema of both legs   PNA (pneumonia)   Chronic combined systolic and diastolic congestive heart failure   OSA (obstructive sleep apnea)   Chronic kidney disease, stage III (moderate)   Discharge Condition: Improved & Stable  Diet recommendation: Heart Healthy diet.  Filed Weights   09/15/12 1116 09/15/12 1815 09/17/12 0519  Weight: 107.956 kg (238 lb) 113.535 kg (250 lb 4.8 oz) 109.952 kg (242 lb 6.4 oz)    History of present illness:  79 y.o. 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 He did complain of some mild shortness of breath. His son brought him in. In the emergency room he was noted to have elevated white blood cell count of 13. His renal function was stable at 1.6. Chest x-ray showed some questionable edema also a focal left lower lobe infiltrate. Patient was assumed to have a community-acquired pneumonia. He was put on some oxygen, given breathing treatments and antibiotics. Hospitalists were called for further  evaluation.  Hospital Course:  1. Community-acquired pneumonia: Patient was admitted to the hospital and started empirically on IV Rocephin and azithromycin of which he has completed 3 days. He has clinically improved with no fevers, denies chest pain or dyspnea. Minimal dry cough. He will be transitioned to renally adjusted dose of levofloxacin to complete a total 7 days of antibiotics. Recommend repeating chest x-ray in a couple of weeks to ensure resolution of pneumonia findings. Patient ambulated with oxygen saturation in the low 90s on room air. Blood cultures x2 negative to date. Urine Legionella and pneumococcal antigen negative. Sputum culture not helpful. 2. Chronic combined CHF:? mild decompensation-now compensated. Continue Lasix. Secondary to ischemic cardiomyopathy with LVEF 35-40%. Cardiology consulted. 3. COPD: Stable. 4. Stage III chronic kidney disease: Stable. Follow BMP as OP. 5. Status post PPM/CAD/CABG: Stable. Pacemaker was interrogated on 5/14 and said to be without issues. 6. HTN: Controlled. 7. GERD 8. Leukocytosis: Seems to be chronic compared to results from March 2013. Patient is on chronic prednisone.  Procedures:  None  Consultations:  Cardiology  Discharge Exam:  Complaints: Denies complaints. Denies chest pain or dyspnea. Minimal intermittent dry cough.  Filed Vitals:   09/17/12 0751 09/17/12 1045 09/17/12 1336 09/17/12 1400  BP:   124/61   Pulse:   71   Temp:   100 F (37.8 C)   TempSrc:   Oral   Resp:   16   Height:      Weight:      SpO2: 89% 90% 97% 93%    General exam: Comfortable. Sitting on reclining chair.  Respiratory system: Slightly  reduced breath sounds in the bases & occasional basal crackles. Rest of lung fields clear to auscultation. No increased work of breathing.  Cardiovascular system: S1 & S2 heard, RRR. No JVD, murmurs, gallops, clicks. No pedal edema. Telemetry: paced rhythm with few PVC's.  Gastrointestinal system: Abdomen  is nondistended, soft and nontender. Normal bowel sounds heard.  Central nervous system: Alert and oriented. No focal neurological deficits.  Extremities: Symmetric 5 x 5 power.  Discharge Instructions      Discharge Orders   Future Appointments Provider Department Dept Phone   10/03/2012 11:00 AM Marykay Lex, MD [facility] [place] [phone]   Future Orders Complete By Expires     (HEART FAILURE PATIENTS) Call MD:  Anytime you have any of the following symptoms: 1) 3 pound weight gain in 24 hours or 5 pounds in 1 week 2) shortness of breath, with or without a dry hacking cough 3) swelling in the hands, feet or stomach 4) if you have to sleep on extra pillows at night in order to breathe.  As directed     Call MD for:  difficulty breathing, headache or visual disturbances  As directed     Call MD for:  temperature >100.4  As directed     Diet - low sodium heart healthy  As directed     Increase activity slowly  As directed         Medication List    TAKE these medications       ADVAIR DISKUS 500-50 MCG/DOSE Aepb  Generic drug:  Fluticasone-Salmeterol  Inhale 1 puff into the lungs every 12 (twelve) hours.     atorvastatin 20 MG tablet  Commonly known as:  LIPITOR  Take 20 mg by mouth daily with breakfast.     COMBIVENT 18-103 MCG/ACT inhaler  Generic drug:  albuterol-ipratropium  Inhale 2 puffs into the lungs 2 (two) times daily.     Fish Oil 1000 MG Caps  Take 1 capsule by mouth daily.     fluticasone 50 MCG/ACT nasal spray  Commonly known as:  FLONASE  Place 2 sprays into the nose 2 (two) times daily as needed. For dryness.     furosemide 40 MG tablet  Commonly known as:  LASIX  Take 1 tablet (40 mg total) by mouth daily with breakfast.     gabapentin 300 MG capsule  Commonly known as:  NEURONTIN  Take 600 mg by mouth 3 (three) times daily.     guaiFENesin 600 MG 12 hr tablet  Commonly known as:  MUCINEX  Take 1,200 mg by mouth  2 (two) times daily.     levofloxacin 750 MG tablet  Commonly known as:  LEVAQUIN  Take 1 tablet (750 mg total) by mouth every other day. Start on 09/18/2012.  Start taking on:  09/18/2012     loratadine 10 MG tablet  Commonly known as:  CLARITIN  Take 10 mg by mouth daily with breakfast.     metoprolol tartrate 25 MG tablet  Commonly known as:  LOPRESSOR  Take 25 mg by mouth 2 (two) times daily.     potassium chloride 10 MEQ tablet  Commonly known as:  K-DUR  Take 10 mEq by mouth 2 (two) times daily.     predniSONE 5 MG tablet  Commonly known as:  DELTASONE  Take 5 mg by mouth daily.     ranitidine 150 MG capsule  Commonly known as:  ZANTAC  Take 150 mg by  mouth 2 (two) times daily.     SLOW-MAG 535 (64 MG) MG Tbcr  Generic drug:  Magnesium Chloride  Take 1 tablet by mouth 2 (two) times daily.     tiotropium 18 MCG inhalation capsule  Commonly known as:  SPIRIVA  Place 18 mcg into inhaler and inhale daily with breakfast.     Vitamin D (Ergocalciferol) 50000 UNITS Caps  Commonly known as:  DRISDOL  Take 50,000 Units by mouth every 7 (seven) days. Take on Fridays     vitamin E 1000 UNIT capsule  Take 1,000 Units by mouth daily with breakfast.       Follow-up Information   Follow up with [facility] On 09/30/2012. (Lab Appt 09-23-12 (CBC & BMP),       Dr. Alfonzo Beers 09-30-12 at 2:15pm)    Contact information:   [address] [place], [place] [address] [phone]      Follow up with Marykay Lex, MD On 10/03/2012. (at 11:00am)    Contact information:   [address] [place] [address] [phone]        The results of significant diagnostics from this hospitalization (including imaging, microbiology, ancillary and laboratory) are listed below for reference.    Significant Diagnostic Studies: Dg Chest Port 1 View  09/15/2012   *RADIOLOGY REPORT*  Clinical Data: 79 year old male weakness.  Pacemaker.  Chronic pulmonary disease.  PORTABLE  CHEST - 1 VIEW  Comparison: 05/16/2012 and earlier.  Findings: Portable AP semi upright view at 1141 hours.  New widespread patchy and confluent left lung opacity, including left upper lobe involvement.  Interval decreased right upper lobe airspace disease and increased density along the right minor fissure. Stable cardiomegaly and mediastinal contours.  Visualized tracheal air column is within normal limits.  Stable right chest cardiac pacemaker.  Sequelae of CABG.  IMPRESSION: 1.  New left lung airspace disease suspicious for multilobar pneumonia. 2.  Interval decreased right upper lobe airspace disease and thickening along the right minor fissure. 3.  Asymmetric pulmonary edema is a less likely consideration in this setting.   Original Report Authenticated By: Erskine Speed, M.D.    Microbiology: Recent Results (from the past 240 hour(s))  CULTURE, BLOOD (ROUTINE X 2)     Status: None   Collection Time    09/15/12  1:40 PM      Result Value Range Status   Specimen Description BLOOD LEFT ANTECUBITAL   Final   Special Requests BOTTLES DRAWN AEROBIC AND ANAEROBIC 3CC   Final   Culture  Setup Time 09/15/2012 22:38   Final   Culture     Final   Value:        BLOOD CULTURE RECEIVED NO GROWTH TO DATE CULTURE WILL BE HELD FOR 5 DAYS BEFORE ISSUING A FINAL NEGATIVE REPORT   Report Status PENDING   Incomplete  CULTURE, BLOOD (ROUTINE X 2)     Status: None   Collection Time    09/15/12  1:45 PM      Result Value Range Status   Specimen Description BLOOD LEFT FOREARM   Final   Special Requests BOTTLES DRAWN AEROBIC ONLY 5CC   Final   Culture  Setup Time 09/15/2012 22:38   Final   Culture     Final   Value:        BLOOD CULTURE RECEIVED NO GROWTH TO DATE CULTURE WILL BE HELD FOR 5 DAYS BEFORE ISSUING A FINAL NEGATIVE REPORT   Report Status PENDING   Incomplete  URINE CULTURE     Status: None   Collection Time    09/15/12  2:42 PM      Result Value Range Status   Specimen Description URINE,  CATHETERIZED   Final   Special Requests NONE   Final   Culture  Setup Time 09/15/2012 23:04   Final   Colony Count NO GROWTH   Final   Culture NO GROWTH   Final   Report Status 09/16/2012 FINAL   Final  MRSA PCR SCREENING     Status: None   Collection Time    09/16/12  8:02 AM      Result Value Range Status   MRSA by PCR NEGATIVE  NEGATIVE Final   Comment:            The GeneXpert MRSA Assay (FDA     approved for NASAL specimens     only), is one component of a     comprehensive MRSA colonization     surveillance program. It is not     intended to diagnose MRSA     infection nor to guide or     monitor treatment for     MRSA infections.  CULTURE, EXPECTORATED SPUTUM-ASSESSMENT     Status: None   Collection Time    09/16/12 10:08 AM      Result Value Range Status   Specimen Description SPUTUM   Final   Special Requests NONE   Final   Sputum evaluation     Final   Value: THIS SPECIMEN IS ACCEPTABLE. RESPIRATORY CULTURE REPORT TO FOLLOW.   Report Status 09/16/2012 FINAL   Final  CULTURE, RESPIRATORY (NON-EXPECTORATED)     Status: None   Collection Time    09/16/12 10:08 AM      Result Value Range Status   Specimen Description SPUTUM   Final   Special Requests NONE   Final   Gram Stain     Final   Value: NO WBC SEEN     ABUNDANT SQUAMOUS EPITHELIAL CELLS PRESENT     FEW YEAST   Culture PENDING   Incomplete   Report Status PENDING   Incomplete     Labs: Basic Metabolic Panel:  Recent Labs Lab 09/15/12 1130 09/16/12 0450 09/17/12 0443  NA 137 132* 135  K 3.6 3.9 4.0  CL 100 99 102  CO2 26 24 25   GLUCOSE 85 77 74  BUN 20 22 18   CREATININE 1.66* 1.62* 1.51*  CALCIUM 9.0 8.0* 8.6   Liver Function Tests: No results found for this basename: AST, ALT, ALKPHOS, BILITOT, PROT, ALBUMIN,  in the last 168 hours No results found for this basename: LIPASE, AMYLASE,  in the last 168 hours No results found for this basename: AMMONIA,  in the last 168 hours CBC:  Recent  Labs Lab 09/15/12 1130 09/16/12 0450  WBC 13.2* 16.2*  HGB 14.2 12.4*  HCT 42.4 38.1*  MCV 86.0 86.8  PLT 168 149*   Cardiac Enzymes: No results found for this basename: CKTOTAL, CKMB, CKMBINDEX, TROPONINI,  in the last 168 hours BNP: BNP (last 3 results)  Recent Labs  05/16/12 1305 09/15/12 1130  PROBNP 2313.0* 1760.0*   CBG: No results found for this basename: GLUCAP,  in the last 168 hours  Additional labs:    Signed:  Danen Lapaglia  [facility] 09/17/2012, 4:57 PM

## 2012-09-17 NOTE — Progress Notes (Signed)
Physical Therapy Treatment Patient Details Name: Richard Chavez MRN: [id number] DOB: 1926/06/20 Today's Date: 09/17/2012 Time: 8295-6213 PT Time Calculation (min): 28 min  PT Assessment / Plan / Recommendation Comments on Treatment Session  pt progressing; pt reports he will use his walker at home as long as needed    Follow Up Recommendations  Home health PT;No PT follow up     Does the patient have the potential to tolerate intense rehabilitation     Barriers to Discharge        Equipment Recommendations  None recommended by PT    Recommendations for Other Services    Frequency Min 3X/week   Plan Discharge plan remains appropriate    Precautions / Restrictions     Pertinent Vitals/Pain Denies pain    Mobility  Bed Mobility Bed Mobility: Not assessed Transfers Transfers: Sit to Stand;Stand to Sit Sit to Stand: 4: Min guard;5: Supervision;From chair/3-in-1 Stand to Sit: 5: Supervision;4: Min guard;To chair/3-in-1 Details for Transfer Assistance: VCs safety, technique, hand placement. Assist to rise, stabilize, control descent Ambulation/Gait Ambulation/Gait Assistance: 4: Min guard;5: Supervision Ambulation Distance (Feet): 320 Feet Assistive device: Rolling walker;1 person hand held assist Ambulation/Gait Assistance Details: VCs safety. Assist to stabilize intermittently. much more steady with RW, pt began amb without AD and nurse tech assist Gait Pattern: Step-through pattern;Decreased stride length;Decreased stance time - right General Gait Details: occasional cues for negotiation of obstacles    Exercises     PT Diagnosis:    PT Problem List:   PT Treatment Interventions:     PT Goals Acute Rehab PT Goals Time For Goal Achievement: 09/30/12 Potential to Achieve Goals: Good Pt will go Sit to Stand: with supervision PT Goal: Sit to Stand - Progress: Progressing toward goal Pt will Ambulate: 51 - 150 feet;with supervision;with least restrictive assistive  device PT Goal: Ambulate - Progress: Progressing toward goal  Visit Information  Last PT Received On: 09/17/12 Assistance Needed: +1    Subjective Data  Subjective: let's go Patient Stated Goal: home   Cognition  Cognition Arousal/Alertness: Awake/alert Behavior During Therapy: WFL for tasks assessed/performed Overall Cognitive Status: Within Functional Limits for tasks assessed    Balance     End of Session PT - End of Session Activity Tolerance: Patient tolerated treatment well Patient left: in chair;with call bell/phone within reach Nurse Communication: Mobility status   GP     [facility] 09/17/2012, 12:07 PM

## 2012-09-17 NOTE — Progress Notes (Signed)
[employer] Pacemaker interrogation rep notified that pt needs pacemaker interrogated prior to discharge. Richard Chavez

## 2012-09-18 ENCOUNTER — Ambulatory Visit: Payer: Medicare Other | Admitting: Cardiology

## 2012-09-21 LAB — CULTURE, BLOOD (ROUTINE X 2): Culture: NO GROWTH

## 2012-10-01 ENCOUNTER — Inpatient Hospital Stay (HOSPITAL_COMMUNITY): Payer: Medicare Other

## 2012-10-01 ENCOUNTER — Emergency Department (HOSPITAL_COMMUNITY): Payer: Medicare Other

## 2012-10-01 ENCOUNTER — Encounter (HOSPITAL_COMMUNITY): Payer: Self-pay | Admitting: Emergency Medicine

## 2012-10-01 ENCOUNTER — Inpatient Hospital Stay (HOSPITAL_COMMUNITY)
Admission: EM | Admit: 2012-10-01 | Discharge: 2012-10-08 | DRG: 064 | Disposition: A | Discharge status: Rehabilitation Facility | Payer: Medicare Other | Attending: Internal Medicine | Admitting: Internal Medicine

## 2012-10-01 DIAGNOSIS — M129 Arthropathy, unspecified: Secondary | ICD-10-CM | POA: Diagnosis present

## 2012-10-01 DIAGNOSIS — I6340 Cerebral infarction due to embolism of unspecified cerebral artery: Principal | ICD-10-CM | POA: Diagnosis present

## 2012-10-01 DIAGNOSIS — E785 Hyperlipidemia, unspecified: Secondary | ICD-10-CM | POA: Diagnosis present

## 2012-10-01 DIAGNOSIS — J189 Pneumonia, unspecified organism: Secondary | ICD-10-CM

## 2012-10-01 DIAGNOSIS — Z87891 Personal history of nicotine dependence: Secondary | ICD-10-CM

## 2012-10-01 DIAGNOSIS — I255 Ischemic cardiomyopathy: Secondary | ICD-10-CM | POA: Diagnosis present

## 2012-10-01 DIAGNOSIS — I7389 Other specified peripheral vascular diseases: Secondary | ICD-10-CM | POA: Diagnosis present

## 2012-10-01 DIAGNOSIS — Z7189 Other specified counseling: Secondary | ICD-10-CM

## 2012-10-01 DIAGNOSIS — J449 Chronic obstructive pulmonary disease, unspecified: Secondary | ICD-10-CM | POA: Diagnosis present

## 2012-10-01 DIAGNOSIS — I4891 Unspecified atrial fibrillation: Secondary | ICD-10-CM | POA: Diagnosis present

## 2012-10-01 DIAGNOSIS — R600 Localized edema: Secondary | ICD-10-CM

## 2012-10-01 DIAGNOSIS — N179 Acute kidney failure, unspecified: Secondary | ICD-10-CM

## 2012-10-01 DIAGNOSIS — I252 Old myocardial infarction: Secondary | ICD-10-CM

## 2012-10-01 DIAGNOSIS — K219 Gastro-esophageal reflux disease without esophagitis: Secondary | ICD-10-CM | POA: Diagnosis present

## 2012-10-01 DIAGNOSIS — E876 Hypokalemia: Secondary | ICD-10-CM | POA: Diagnosis not present

## 2012-10-01 DIAGNOSIS — Z951 Presence of aortocoronary bypass graft: Secondary | ICD-10-CM

## 2012-10-01 DIAGNOSIS — I4729 Other ventricular tachycardia: Secondary | ICD-10-CM | POA: Diagnosis present

## 2012-10-01 DIAGNOSIS — Z950 Presence of cardiac pacemaker: Secondary | ICD-10-CM | POA: Diagnosis present

## 2012-10-01 DIAGNOSIS — I472 Ventricular tachycardia, unspecified: Secondary | ICD-10-CM | POA: Diagnosis present

## 2012-10-01 DIAGNOSIS — R609 Edema, unspecified: Secondary | ICD-10-CM

## 2012-10-01 DIAGNOSIS — I10 Essential (primary) hypertension: Secondary | ICD-10-CM | POA: Diagnosis present

## 2012-10-01 DIAGNOSIS — J690 Pneumonitis due to inhalation of food and vomit: Secondary | ICD-10-CM | POA: Diagnosis present

## 2012-10-01 DIAGNOSIS — N189 Chronic kidney disease, unspecified: Secondary | ICD-10-CM

## 2012-10-01 DIAGNOSIS — N400 Enlarged prostate without lower urinary tract symptoms: Secondary | ICD-10-CM | POA: Diagnosis present

## 2012-10-01 DIAGNOSIS — E875 Hyperkalemia: Secondary | ICD-10-CM | POA: Diagnosis not present

## 2012-10-01 DIAGNOSIS — G936 Cerebral edema: Secondary | ICD-10-CM | POA: Diagnosis present

## 2012-10-01 DIAGNOSIS — G8190 Hemiplegia, unspecified affecting unspecified side: Secondary | ICD-10-CM | POA: Diagnosis present

## 2012-10-01 DIAGNOSIS — I5043 Acute on chronic combined systolic (congestive) and diastolic (congestive) heart failure: Secondary | ICD-10-CM | POA: Diagnosis present

## 2012-10-01 DIAGNOSIS — I6381 Other cerebral infarction due to occlusion or stenosis of small artery: Secondary | ICD-10-CM | POA: Diagnosis present

## 2012-10-01 DIAGNOSIS — K659 Peritonitis, unspecified: Secondary | ICD-10-CM | POA: Diagnosis not present

## 2012-10-01 DIAGNOSIS — I129 Hypertensive chronic kidney disease with stage 1 through stage 4 chronic kidney disease, or unspecified chronic kidney disease: Secondary | ICD-10-CM | POA: Diagnosis present

## 2012-10-01 DIAGNOSIS — M810 Age-related osteoporosis without current pathological fracture: Secondary | ICD-10-CM | POA: Diagnosis present

## 2012-10-01 DIAGNOSIS — I2510 Atherosclerotic heart disease of native coronary artery without angina pectoris: Secondary | ICD-10-CM | POA: Diagnosis present

## 2012-10-01 DIAGNOSIS — I2589 Other forms of chronic ischemic heart disease: Secondary | ICD-10-CM | POA: Diagnosis present

## 2012-10-01 DIAGNOSIS — I5042 Chronic combined systolic (congestive) and diastolic (congestive) heart failure: Secondary | ICD-10-CM | POA: Diagnosis present

## 2012-10-01 DIAGNOSIS — G4733 Obstructive sleep apnea (adult) (pediatric): Secondary | ICD-10-CM | POA: Diagnosis present

## 2012-10-01 DIAGNOSIS — I480 Paroxysmal atrial fibrillation: Secondary | ICD-10-CM | POA: Diagnosis not present

## 2012-10-01 DIAGNOSIS — I509 Heart failure, unspecified: Secondary | ICD-10-CM

## 2012-10-01 DIAGNOSIS — I25810 Atherosclerosis of coronary artery bypass graft(s) without angina pectoris: Secondary | ICD-10-CM

## 2012-10-01 DIAGNOSIS — K56609 Unspecified intestinal obstruction, unspecified as to partial versus complete obstruction: Secondary | ICD-10-CM | POA: Diagnosis not present

## 2012-10-01 DIAGNOSIS — I959 Hypotension, unspecified: Secondary | ICD-10-CM

## 2012-10-01 DIAGNOSIS — J4489 Other specified chronic obstructive pulmonary disease: Secondary | ICD-10-CM | POA: Diagnosis present

## 2012-10-01 DIAGNOSIS — N182 Chronic kidney disease, stage 2 (mild): Secondary | ICD-10-CM | POA: Diagnosis present

## 2012-10-01 DIAGNOSIS — N183 Chronic kidney disease, stage 3 unspecified: Secondary | ICD-10-CM | POA: Diagnosis present

## 2012-10-01 HISTORY — DX: Atherosclerotic heart disease of native coronary artery without angina pectoris: I25.10

## 2012-10-01 LAB — CBC WITH DIFFERENTIAL/PLATELET
Basophils Relative: 0 % (ref 0–1)
HCT: 43.6 % (ref 39.0–52.0)
Hemoglobin: 14.4 g/dL (ref 13.0–17.0)
Lymphs Abs: 1.1 10*3/uL (ref 0.7–4.0)
MCH: 28.8 pg (ref 26.0–34.0)
MCHC: 33 g/dL (ref 30.0–36.0)
Monocytes Absolute: 0.3 10*3/uL (ref 0.1–1.0)
Monocytes Relative: 3 % (ref 3–12)
Neutro Abs: 7.3 10*3/uL (ref 1.7–7.7)
RBC: 5 MIL/uL (ref 4.22–5.81)

## 2012-10-01 LAB — COMPREHENSIVE METABOLIC PANEL
Albumin: 3.1 g/dL — ABNORMAL LOW (ref 3.5–5.2)
Alkaline Phosphatase: 59 U/L (ref 39–117)
BUN: 20 mg/dL (ref 6–23)
CO2: 22 mEq/L (ref 19–32)
Chloride: 99 mEq/L (ref 96–112)
Creatinine, Ser: 1.42 mg/dL — ABNORMAL HIGH (ref 0.50–1.35)
GFR calc Af Amer: 51 mL/min — ABNORMAL LOW (ref >90)
GFR calc non Af Amer: 44 mL/min — ABNORMAL LOW (ref >90)
Glucose, Bld: 88 mg/dL (ref 70–99)
Potassium: 4.2 mEq/L (ref 3.5–5.1)
Total Bilirubin: 0.9 mg/dL (ref 0.3–1.2)

## 2012-10-01 LAB — URINALYSIS, ROUTINE W REFLEX MICROSCOPIC
Bilirubin Urine: NEGATIVE
Hgb urine dipstick: NEGATIVE
Ketones, ur: NEGATIVE mg/dL
Nitrite: NEGATIVE
Protein, ur: NEGATIVE mg/dL
Urobilinogen, UA: 1 mg/dL (ref 0.0–1.0)

## 2012-10-01 LAB — POCT I-STAT 3, ART BLOOD GAS (G3+)
TCO2: 24 mmol/L (ref 0–100)
pCO2 arterial: 38.8 mmHg (ref 35.0–45.0)
pH, Arterial: 7.386 (ref 7.350–7.450)
pO2, Arterial: 72 mmHg — ABNORMAL LOW (ref 80.0–100.0)

## 2012-10-01 LAB — PRO B NATRIURETIC PEPTIDE: Pro B Natriuretic peptide (BNP): 1059 pg/mL — ABNORMAL HIGH (ref 0–450)

## 2012-10-01 LAB — TROPONIN I: Troponin I: <0.3 ng/mL (ref <0.30)

## 2012-10-01 MED ORDER — MAGNESIUM CHLORIDE ER 535 (64 MG) MG PO TBCR: 1.0000 | EXTENDED_RELEASE_TABLET | Freq: Two times a day (BID) | ORAL | Status: DC

## 2012-10-01 MED ORDER — TIOTROPIUM BROMIDE MONOHYDRATE 18 MCG IN CAPS: 18.0000 ug | ORAL_CAPSULE | Freq: Every day | RESPIRATORY_TRACT | Status: DC

## 2012-10-01 MED ORDER — POTASSIUM CHLORIDE ER 10 MEQ PO TBCR
10.0000 meq | EXTENDED_RELEASE_TABLET | Freq: Two times a day (BID) | ORAL | Status: DC
  Administered 2012-10-01 – 2012-10-02 (×3): 10 meq via ORAL
  Filled 2012-10-01 (×5): qty 1

## 2012-10-01 MED ORDER — LORATADINE 10 MG PO TABS
10.0000 mg | ORAL_TABLET | Freq: Every day | ORAL | Status: DC
  Administered 2012-10-02 – 2012-10-08 (×5): 10 mg via ORAL
  Filled 2012-10-01 (×9): qty 1

## 2012-10-01 MED ORDER — PIPERACILLIN-TAZOBACTAM 3.375 G IVPB
3.3750 g | Freq: Three times a day (TID) | INTRAVENOUS | Status: DC
  Administered 2012-10-01 – 2012-10-07 (×18): 3.375 g via INTRAVENOUS
  Filled 2012-10-01 (×20): qty 50

## 2012-10-01 MED ORDER — ATORVASTATIN CALCIUM 20 MG PO TABS
20.0000 mg | ORAL_TABLET | Freq: Every day | ORAL | Status: DC
  Administered 2012-10-02 – 2012-10-08 (×5): 20 mg via ORAL
  Filled 2012-10-01 (×8): qty 1

## 2012-10-01 MED ORDER — VANCOMYCIN HCL 10 G IV SOLR
2000.0000 mg | Freq: Once | INTRAVENOUS | Status: AC
  Administered 2012-10-01: 2000 mg via INTRAVENOUS
  Filled 2012-10-01: qty 2000

## 2012-10-01 MED ORDER — ASPIRIN EC 325 MG PO TBEC
325.0000 mg | DELAYED_RELEASE_TABLET | Freq: Every day | ORAL | Status: DC
  Filled 2012-10-01 (×2): qty 1

## 2012-10-01 MED ORDER — FUROSEMIDE 10 MG/ML IJ SOLN: 20.0000 mg | Freq: Two times a day (BID) | INTRAMUSCULAR | Status: DC

## 2012-10-01 MED ORDER — ASPIRIN 300 MG RE SUPP
300.0000 mg | Freq: Every day | RECTAL | Status: DC
  Administered 2012-10-01: 300 mg via RECTAL
  Filled 2012-10-01 (×2): qty 1

## 2012-10-01 MED ORDER — FUROSEMIDE 10 MG/ML IJ SOLN
80.0000 mg | Freq: Once | INTRAMUSCULAR | Status: AC
  Administered 2012-10-01: 80 mg via INTRAVENOUS
  Filled 2012-10-01: qty 8

## 2012-10-01 MED ORDER — GABAPENTIN 300 MG PO CAPS
600.0000 mg | ORAL_CAPSULE | Freq: Three times a day (TID) | ORAL | Status: DC
  Administered 2012-10-01 – 2012-10-08 (×14): 600 mg via ORAL
  Filled 2012-10-01 (×23): qty 2

## 2012-10-01 MED ORDER — FUROSEMIDE 10 MG/ML IJ SOLN: 40.0000 mg | Freq: Two times a day (BID) | INTRAMUSCULAR | Status: DC

## 2012-10-01 MED ORDER — FUROSEMIDE 10 MG/ML IJ SOLN
40.0000 mg | Freq: Two times a day (BID) | INTRAMUSCULAR | Status: DC
  Administered 2012-10-01 – 2012-10-02 (×3): 40 mg via INTRAVENOUS
  Filled 2012-10-01 (×6): qty 4

## 2012-10-01 MED ORDER — IPRATROPIUM-ALBUTEROL 20-100 MCG/ACT IN AERS
2.0000 | INHALATION_SPRAY | Freq: Two times a day (BID) | RESPIRATORY_TRACT | Status: DC
  Administered 2012-10-02 – 2012-10-08 (×13): 2 via RESPIRATORY_TRACT
  Filled 2012-10-01 (×3): qty 4

## 2012-10-01 MED ORDER — VANCOMYCIN HCL 10 G IV SOLR
1500.0000 mg | INTRAVENOUS | Status: DC
  Administered 2012-10-02: 1500 mg via INTRAVENOUS
  Filled 2012-10-01 (×2): qty 1500

## 2012-10-01 MED ORDER — MAGNESIUM CHLORIDE 64 MG PO TBEC
1.0000 | DELAYED_RELEASE_TABLET | Freq: Two times a day (BID) | ORAL | Status: DC
  Administered 2012-10-01 – 2012-10-08 (×10): 64 mg via ORAL
  Filled 2012-10-01 (×15): qty 1

## 2012-10-01 MED ORDER — GUAIFENESIN ER 600 MG PO TB12
1200.0000 mg | ORAL_TABLET | Freq: Two times a day (BID) | ORAL | Status: DC
  Administered 2012-10-01 – 2012-10-08 (×10): 1200 mg via ORAL
  Filled 2012-10-01 (×15): qty 2

## 2012-10-01 MED ORDER — WHITE PETROLATUM GEL
Status: AC
  Filled 2012-10-01: qty 5

## 2012-10-01 MED ORDER — IPRATROPIUM-ALBUTEROL 18-103 MCG/ACT IN AERO
2.0000 | INHALATION_SPRAY | Freq: Two times a day (BID) | RESPIRATORY_TRACT | Status: DC
  Filled 2012-10-01: qty 14.7

## 2012-10-01 MED ORDER — VITAMIN D (ERGOCALCIFEROL) 1.25 MG (50000 UNIT) PO CAPS
50000.0000 [IU] | ORAL_CAPSULE | ORAL | Status: DC
  Filled 2012-10-01: qty 1

## 2012-10-01 NOTE — Progress Notes (Signed)
ANTIBIOTIC CONSULT NOTE - INITIAL  Pharmacy Consult for Vancomycin + Zosyn Indication: rule out pneumonia  No Known Allergies  Patient Measurements: Height: 6\' 2"  (188 cm) Weight: 241 lb (109.317 kg) IBW/kg (Calculated) : 82.2  Vital Signs: Temp: 99.8 F (37.7 C) (05/28 0846) Temp src: Oral (05/28 0846) BP: 98/50 mmHg (05/28 1154) Pulse Rate: 82 (05/28 1154) Intake/Output from previous day:   Intake/Output from this shift:    Labs:  Recent Labs  10/01/12 0846  WBC 8.8  HGB 14.4  PLT 210  CREATININE 1.42*   Estimated Creatinine Clearance: 51.8 ml/min (by C-G formula based on Cr of 1.42). No results found for this basename: VANCOTROUGH, Leodis Binet, VANCORANDOM, GENTTROUGH, GENTPEAK, GENTRANDOM, TOBRATROUGH, TOBRAPEAK, TOBRARND, AMIKACINPEAK, AMIKACINTROU, AMIKACIN,  in the last 72 hours   Microbiology: Recent Results (from the past 720 hour(s))  CULTURE, BLOOD (ROUTINE X 2)     Status: None   Collection Time    09/15/12  1:40 PM      Result Value Range Status   Specimen Description BLOOD LEFT ANTECUBITAL   Final   Special Requests BOTTLES DRAWN AEROBIC AND ANAEROBIC 3CC   Final   Culture  Setup Time 09/15/2012 22:38   Final   Culture NO GROWTH 5 DAYS   Final   Report Status 09/21/2012 FINAL   Final  CULTURE, BLOOD (ROUTINE X 2)     Status: None   Collection Time    09/15/12  1:45 PM      Result Value Range Status   Specimen Description BLOOD LEFT FOREARM   Final   Special Requests BOTTLES DRAWN AEROBIC ONLY 5CC   Final   Culture  Setup Time 09/15/2012 22:38   Final   Culture NO GROWTH 5 DAYS   Final   Report Status 09/21/2012 FINAL   Final  URINE CULTURE     Status: None   Collection Time    09/15/12  2:42 PM      Result Value Range Status   Specimen Description URINE, CATHETERIZED   Final   Special Requests NONE   Final   Culture  Setup Time 09/15/2012 23:04   Final   Colony Count NO GROWTH   Final   Culture NO GROWTH   Final   Report Status 09/16/2012  FINAL   Final  MRSA PCR SCREENING     Status: None   Collection Time    09/16/12  8:02 AM      Result Value Range Status   MRSA by PCR NEGATIVE  NEGATIVE Final   Comment:            The GeneXpert MRSA Assay (FDA     approved for NASAL specimens     only), is one component of a     comprehensive MRSA colonization     surveillance program. It is not     intended to diagnose MRSA     infection nor to guide or     monitor treatment for     MRSA infections.  CULTURE, EXPECTORATED SPUTUM-ASSESSMENT     Status: None   Collection Time    09/16/12 10:08 AM      Result Value Range Status   Specimen Description SPUTUM   Final   Special Requests NONE   Final   Sputum evaluation     Final   Value: THIS SPECIMEN IS ACCEPTABLE. RESPIRATORY CULTURE REPORT TO FOLLOW.   Report Status 09/16/2012 FINAL   Final  CULTURE, RESPIRATORY (NON-EXPECTORATED)  Status: None   Collection Time    09/16/12 10:08 AM      Result Value Range Status   Specimen Description SPUTUM   Final   Special Requests NONE   Final   Gram Stain     Final   Value: NO WBC SEEN     ABUNDANT SQUAMOUS EPITHELIAL CELLS PRESENT     FEW YEAST   Culture     Final   Value: FEW CANDIDA ALBICANS     Note: MICROSCOPIC FINDINGS SUGGEST THAT THIS SPECIMEN IS NOT REPRESENTATIVE OF LOWER RESPIRATORY SECRETIONS. PLEASE RECOLLECT.   Report Status 09/19/2012 FINAL   Final    Medical History: Past Medical History  Diagnosis Date  . CAD (coronary artery disease)   . Hypertension   . Hyperlipidemia   . GERD (gastroesophageal reflux disease)   . COPD (chronic obstructive pulmonary disease)   . CRF (chronic renal failure)   . Barrett's esophagus   . OP (osteoporosis)   . CAD (coronary artery disease) of artery bypass graft 07/04/2011    LIMA-LAD; SVG-OM2-OM3, SVG-RCA -- all patent as of 05/27/11  . Syncope 07/04/2011  . Pacemaker 07/04/2011  . Cardiomyopathy, ischemic 07/07/2011    2D Echo - EF 35-40, moderately dilated left atrium  .  Myocardial infarction     25 yrs ago  . Peripheral vascular disease   . Arthritis   . BPH (benign prostatic hyperplasia)   . Polyp of colon     removed  . Sleep apnea     STOP BANG SCORE 4  . AV block, 3rd degree     Post [employer] pacemaker, EF 35-40, does have intermittent atrial tachycardia, either PAt or Afib  . BPH (benign prostatic hypertrophy) 11/15/2011  . SOB (shortness of breath) on exertion 02/14/2010    2D Echo - EF 35-45,     Assessment: 79 y.o. M recently hospitalized for treatment of CAP from 5/12 to 5/14 who re-presented to the [facility] on 5/28 with SOB/weakness. Pharmacy was consulted to start Vancomycin + Zosyn for HCAP coverage (since patient recently admitted).  Last admission the patient was treated with Rocephin + Azithromycin x 3 days then transitioned to Levaquin upon discharge to complete a total of 7 days worth of antibiotics. Wt: 109.3 kg, SCr 1.42, CrCl~40 ml/min.   Goal of Therapy:  Vancomycin trough level 15-20 mcg/ml Proper antibiotics for infection/cultures adjusted for renal/hepatic function   Plan:  1. Vancomycin 2000 mg x 1 dose to load followed by Vancomycin 1500 mg IV every 24 hours 2. Zosyn 3.375g IV every 8 hours 3. Will continue to follow renal function, culture results, LOT, and antibiotic de-escalation plans   Georgina Pillion, PharmD, BCPS Clinical Pharmacist Pager: [phone] 10/01/2012 12:44 PM

## 2012-10-01 NOTE — Consult Note (Addendum)
Reason for Consult: A/C CHF Referring Physician: TRH   HPI: This is a 79 y.o. male, followed at [facility], primarily by Dr. Herbie Baltimore, but also sees Dr. Royann Shivers for his pacemaker. He has a past medical history significant for CAD. He had CABG in 1998. He was just cathed 06/06/11 after an abnormal Myoview. Cath by Dr Herbie Baltimore showed patent grafts with an EF of 30-35%. Other medical history includes s/p PPM insertion, combined systolic/diastolic HF, CKD and COPD. He was recently admitted from 09/15/12-09/17/12 for pneumonia and treated with antibiotics. He presented back to the [facility] ER today with complaints of sudden onset of fatigue, generalized malaise, fever and chills. He endorses mild SOB and productive cough with white colored sputum. He also notes increased bilateral lower extremity edema. He denies orthopnea, PND and chest pain. He reports medical compliance, including recently prescribed antibiotics. His son notes that he did have fried chicken last night for dinner. The patient received one dose of IV Lasix in the ED and now reports notable improvement in lower extremity edema and is less short of breath. The patient also endorses new left hand numbness and tingling. He denies weakness. No numbness, tingling or weakness of the lower extremity. No dysphasia, syncope, memory impariment or sensory changes.    Past Medical History  Diagnosis Date  . CAD (coronary artery disease), native coronary artery     Status post CABG  . Hypertension   . Hyperlipidemia   . GERD (gastroesophageal reflux disease)   . COPD (chronic obstructive pulmonary disease)   . CRF (chronic renal failure)   . Barrett's esophagus   . OP (osteoporosis)   . CAD (coronary artery disease) of artery bypass graft 07/04/2011    LIMA-LAD; SVG-OM2-OM3, SVG-RCA -- all patent as of 05/27/11  . Syncope 07/04/2011  . Pacemaker 07/04/2011  . Cardiomyopathy, ischemic 07/07/2011    2D Echo - EF 35-40, moderately dilated left atrium  . Myocardial  infarction     25 yrs ago  . Peripheral vascular disease   . Arthritis   . BPH (benign prostatic hyperplasia)   . Polyp of colon     removed  . Sleep apnea     STOP BANG SCORE 4  . AV block, 3rd degree     Post St. Jude pacemaker, EF 35-40, does have intermittent atrial tachycardia, either PAt or Afib  . BPH (benign prostatic hypertrophy) 11/15/2011  . SOB (shortness of breath) on exertion 02/14/2010    2D Echo - EF 35-45,     Past Surgical History  Procedure Laterality Date  . Pacemaker placement  2007  . Cholecystectomy  2005  . Coronary artery bypass graft  1978  . Lesion excision      from penis  . Cardiac catheterization  06/06/2011    occluded proximal RCA, ostial LAD and mid Circumflex after OM 1.; [place] LIMA-LAD, patent SVG-OM1-OM 2, patent SVG-RCA. Reduced ejection fraction of 30-35%.  . Cardiac catheterization  06/14/2010    LIMA to LAD, SVG to RCA, SVG to OM1 Circumflex, 100% occluded RCA, 100% occluded circumfkex, 100% occluded LAD, no evidence of graft dysfunction, continue medical therapy  . Cardiac catheterization  04/12/2003    3 vessel coronary artery disease, continue medical treatment  . Cardiac catheterization  10/07/2000    Severe 3 vessel coronary artery disease, continue medical treatment    Family History  Problem Relation Age of Onset  . Leukemia Father   . Heart disease Father   . Alzheimer's disease Mother   .  Arthritis Mother   . Lung cancer Son 51  . Alzheimer's disease Maternal Grandmother     Social History:  reports that he quit smoking about 27 years ago. His smoking use included Cigarettes. He has a 4.5 pack-year smoking history. He has never used smokeless tobacco. He reports that he does not drink alcohol or use illicit drugs.  Allergies: No Known Allergies  Prior to Admission medications   Medication Sig Start Date End Date Taking? Authorizing Provider  albuterol-ipratropium (COMBIVENT) 18-103 MCG/ACT inhaler Inhale 2 puffs into the  lungs 2 (two) times daily.    Yes Historical Provider, MD  atorvastatin (LIPITOR) 20 MG tablet Take 20 mg by mouth daily with breakfast.    Yes Historical Provider, MD  fluticasone (FLONASE) 50 MCG/ACT nasal spray Place 2 sprays into the nose 2 (two) times daily as needed. For dryness. 03/09/11  Yes Historical Provider, MD  Fluticasone-Salmeterol (ADVAIR DISKUS) 500-50 MCG/DOSE AEPB Inhale 1 puff into the lungs every 12 (twelve) hours.    Yes Historical Provider, MD  furosemide (LASIX) 40 MG tablet Take 1 tablet (40 mg total) by mouth daily with breakfast. 05/23/12  Yes Penny Pia, MD  gabapentin (NEURONTIN) 300 MG capsule Take 600 mg by mouth 3 (three) times daily.  02/07/11  Yes Historical Provider, MD  guaiFENesin (MUCINEX) 600 MG 12 hr tablet Take 1,200 mg by mouth 2 (two) times daily.   Yes Historical Provider, MD  loratadine (CLARITIN) 10 MG tablet Take 10 mg by mouth daily with breakfast.    Yes Historical Provider, MD  Magnesium Chloride (SLOW-MAG) 535 (64 MG) MG TBCR Take 1 tablet by mouth 2 (two) times daily.    Yes Historical Provider, MD  metoprolol tartrate (LOPRESSOR) 25 MG tablet Take 25 mg by mouth 2 (two) times daily.   Yes Historical Provider, MD  Omega-3 Fatty Acids (FISH OIL) 1000 MG CAPS Take 1 capsule by mouth daily.    Yes Historical Provider, MD  potassium chloride (K-DUR) 10 MEQ tablet Take 10 mEq by mouth 2 (two) times daily.    Yes Historical Provider, MD  predniSONE (DELTASONE) 5 MG tablet Take 5 mg by mouth daily.   Yes Historical Provider, MD  ranitidine (ZANTAC) 150 MG capsule Take 150 mg by mouth 2 (two) times daily.    Yes Historical Provider, MD  tiotropium (SPIRIVA) 18 MCG inhalation capsule Place 18 mcg into inhaler and inhale daily with breakfast.    Yes Historical Provider, MD  Vitamin D, Ergocalciferol, (DRISDOL) 50000 UNITS CAPS Take 50,000 Units by mouth every 7 (seven) days. Take on Fridays   Yes Historical Provider, MD  vitamin E 1000 UNIT capsule Take 1,000  Units by mouth daily with breakfast.    Yes Historical Provider, MD  levofloxacin (LEVAQUIN) 750 MG tablet Take 1 tablet (750 mg total) by mouth every other day. Start on 09/18/2012. 09/18/12   Elease Etienne, MD     Results for orders placed during the hospital encounter of 10/01/12 (from the past 48 hour(s))  CBC WITH DIFFERENTIAL     Status: Abnormal   Collection Time    10/01/12  8:46 AM      Result Value Range   WBC 8.8  4.0 - 10.5 K/uL   RBC 5.00  4.22 - 5.81 MIL/uL   Hemoglobin 14.4  13.0 - 17.0 g/dL   HCT 16.1  09.6 - 04.5 %   MCV 87.2  78.0 - 100.0 fL   MCH 28.8  26.0 - 34.0 pg  MCHC 33.0  30.0 - 36.0 g/dL   RDW 16.1 (*) 09.6 - 04.5 %   Platelets 210  150 - 400 K/uL   Neutrophils Relative % 83 (*) 43 - 77 %   Neutro Abs 7.3  1.7 - 7.7 K/uL   Lymphocytes Relative 13  12 - 46 %   Lymphs Abs 1.1  0.7 - 4.0 K/uL   Monocytes Relative 3  3 - 12 %   Monocytes Absolute 0.3  0.1 - 1.0 K/uL   Eosinophils Relative 1  0 - 5 %   Eosinophils Absolute 0.1  0.0 - 0.7 K/uL   Basophils Relative 0  0 - 1 %   Basophils Absolute 0.0  0.0 - 0.1 K/uL  COMPREHENSIVE METABOLIC PANEL     Status: Abnormal   Collection Time    10/01/12  8:46 AM      Result Value Range   Sodium 136  135 - 145 mEq/L   Potassium 4.2  3.5 - 5.1 mEq/L   Chloride 99  96 - 112 mEq/L   CO2 22  19 - 32 mEq/L   Glucose, Bld 88  70 - 99 mg/dL   BUN 20  6 - 23 mg/dL   Creatinine, Ser 4.09 (*) 0.50 - 1.35 mg/dL   Calcium 8.8  8.4 - 81.1 mg/dL   Total Protein 6.6  6.0 - 8.3 g/dL   Albumin 3.1 (*) 3.5 - 5.2 g/dL   AST 14  0 - 37 U/L   ALT 12  0 - 53 U/L   Alkaline Phosphatase 59  39 - 117 U/L   Total Bilirubin 0.9  0.3 - 1.2 mg/dL   GFR calc non Af Amer 44 (*) >90 mL/min   GFR calc Af Amer 51 (*) >90 mL/min   Comment:            The eGFR has been calculated     using the CKD EPI equation.     This calculation has not been     validated in all clinical     situations.     eGFR's persistently     <90 mL/min  signify     possible Chronic Kidney Disease.  PRO B NATRIURETIC PEPTIDE     Status: Abnormal   Collection Time    10/01/12  8:49 AM      Result Value Range   Pro B Natriuretic peptide (BNP) 1059.0 (*) 0 - 450 pg/mL  TROPONIN I     Status: None   Collection Time    10/01/12  8:49 AM      Result Value Range   Troponin I <0.30  <0.30 ng/mL   Comment:            Due to the release kinetics of cTnI,     a negative result within the first hours     of the onset of symptoms does not rule out     myocardial infarction with certainty.     If myocardial infarction is still suspected,     repeat the test at appropriate intervals.  URINALYSIS, ROUTINE W REFLEX MICROSCOPIC     Status: None   Collection Time    10/01/12  9:25 AM      Result Value Range   Color, Urine YELLOW  YELLOW   APPearance CLEAR  CLEAR   Specific Gravity, Urine 1.015  1.005 - 1.030   pH 7.0  5.0 - 8.0   Glucose, UA NEGATIVE  NEGATIVE  mg/dL   Hgb urine dipstick NEGATIVE  NEGATIVE   Bilirubin Urine NEGATIVE  NEGATIVE   Ketones, ur NEGATIVE  NEGATIVE mg/dL   Protein, ur NEGATIVE  NEGATIVE mg/dL   Urobilinogen, UA 1.0  0.0 - 1.0 mg/dL   Nitrite NEGATIVE  NEGATIVE   Leukocytes, UA NEGATIVE  NEGATIVE   Comment: MICROSCOPIC NOT DONE ON URINES WITH NEGATIVE PROTEIN, BLOOD, LEUKOCYTES, NITRITE, OR GLUCOSE <1000 mg/dL.  POCT I-STAT 3, BLOOD GAS (G3+)     Status: Abnormal   Collection Time    10/01/12 12:22 PM      Result Value Range   pH, Arterial 7.386  7.350 - 7.450   pCO2 arterial 38.8  35.0 - 45.0 mmHg   pO2, Arterial 72.0 (*) 80.0 - 100.0 mmHg   Bicarbonate 23.3  20.0 - 24.0 mEq/L   TCO2 24  0 - 100 mmol/L   O2 Saturation 94.0     Acid-base deficit 2.0  0.0 - 2.0 mmol/L   Patient temperature 98.6 F     Collection site RADIAL, ALLEN'S TEST ACCEPTABLE     Drawn by Operator     Sample type ARTERIAL      Dg Chest 2 View  10/01/2012   *RADIOLOGY REPORT*  Clinical Data: Shortness of breath.  CABG.  CHEST - 2 VIEW   Comparison: 09/15/2012  Findings: Motion and position degraded lateral view.  Aortic atherosclerosis. Pacer with leads at right atrium and right ventricle.  No lead discontinuity.  Cardiomegaly accentuated by AP portable technique.  No definite pleural fluid. No pneumothorax.  Similar to minimal improvement in interstitial and airspace disease involving the mid and inferior left lung.  Worsened right-sided aeration with lower lobe predominant interstitial and airspace disease. Somewhat to more confluent inferior right upper lobe opacity is favored to represent an area of scarring.  IMPRESSION: Worsened aeration, especially on the right.  Suspicious for alveolar pulmonary edema versus multifocal infection.  Degraded exam, especially the lateral view.   Original Report Authenticated By: Jeronimo Greaves, M.D.    Review of Systems  Constitutional: Positive for fever, chills and malaise/fatigue. Negative for diaphoresis.  Eyes: Negative for blurred vision and double vision.  Respiratory: Positive for cough, sputum production and shortness of breath. Negative for hemoptysis.   Cardiovascular: Positive for leg swelling. Negative for chest pain, palpitations, orthopnea, claudication and PND.  Gastrointestinal: Positive for constipation. Negative for nausea, vomiting, abdominal pain, diarrhea, blood in stool and melena.  Genitourinary: Negative for dysuria and hematuria.  Musculoskeletal: Negative for falls.  Neurological: Positive for tingling. Negative for dizziness, sensory change, speech change, focal weakness, loss of consciousness, weakness and headaches.  Psychiatric/Behavioral: Negative for memory loss.   Blood pressure 104/51, pulse 83, temperature 99.8 F (37.7 C), temperature source Oral, resp. rate 22, height 6\' 2"  (1.88 m), weight 241 lb (109.317 kg), SpO2 96.00%. Physical Exam  Constitutional: He is oriented to person, place, and time. He appears well-developed and well-nourished. No distress.   Obese   HENT:  Head: Normocephalic and atraumatic.  Eyes: Conjunctivae are normal. Pupils are equal, round, and reactive to light.  Neck: Normal range of motion. No JVD present. Carotid bruit is not present. No thyromegaly present.  Cardiovascular: Normal rate, regular rhythm, normal heart sounds and intact distal pulses.  Exam reveals no gallop and no friction rub.   No murmur heard. Pulses:      Femoral pulses are 2+ on the right side, and 2+ on the left side.  Dorsalis pedis pulses are 2+ on the right side, and 2+ on the left side.  Respiratory: Effort normal. No respiratory distress. He has no wheezes. He has rales (minimal bibasilar crackles). He exhibits no tenderness.  GI: Soft. Bowel sounds are normal. He exhibits distension (mildly distended). He exhibits no mass. There is no tenderness.  Musculoskeletal: He exhibits edema (1+ bilateral).  Lymphadenopathy:    He has no cervical adenopathy.  Neurological: He is alert and oriented to person, place, and time.  Skin: Skin is warm and dry. He is not diaphoretic.  Psychiatric: He has a normal mood and affect. His behavior is normal.    Assessment/Plan: Principal Problem: Possible recurring Aspiration PNA vs A/C CHF Active Problems:   COPD (chronic obstructive pulmonary disease)   CAD CABG 1998, patent grafts 06/06/11 after abn Myoview   Pacemaker, St Jude, implanted 2008   Cardiomyopathy, ischemic, EF 30-35% by recent cath   Chronic kidney disease, stage III (moderate)   Plan: 79 y/o male with known ICM (EF of 30-35%) and recent diagnosis of PNA on 09/15/12 presented back to ER today with generalized malaise, fatigue, fever, chills, SOB, productive cough and left handed paresthesias. W/u is currently being conducted to rule out CVA. Head CT pending. CXR compared to prior on 5/12 shows worsened aeration, especially on the right, suspicious for alveolar pulmonary edema versus multifocal infection. BNP is elevated at 1,059. EKG  shows sinus tach w/ ventricular rate ~100bpm w/o acute changes. Scr is 1.42. Pt received one dose of IV Lasix, 80 mg, in ED and notes subjective improvement in LEE and breathing. Antibiotics have been started for suspected recurrent PNA. Based on recent symptoms and lack of PND, orthopnea and JVD, I suspect that worsening pneumonia is the primary problem. However a mild, acute, CHF exacerbation can be contributing. Can consider giving another dose of IV Lasix later today or in the am, then reassess in the AM and recheck a BNP and f/u CXR in several days. Will need to monitor renal function and electrolytes.  MD to follow with further recommendation.  Allayne Butcher, PA-C  10/01/2012, 2:41 PM   I have seen and evaluated the patient this PM along with Boyce Medici, PA. I agree with her findings, examination as well as impression recommendations. Mr. Staples is well known to me.  He was recently admitted for what sounds like a pneumonia. He now represents with left-sided weakness and fatigue as well as dyspnea. He did note worsening lower STEMI edema but really denied any PND or orthopnea. His chest x-ray does show signs of likely pulmonary vascular congestion and pleural effusions. I do think it is also possible concern for aspiration pneumonia. Unfortunately I think the major problem with him is his dense left arm paralysis with reduced sensation and as well as some right left leg paresis. I think he quite likely a has had right-sided stroke. This may be, complicated by some mild acute on chronic combined systolic/diastolic heart failure.   Principal Problem:   Stroke, lacunar Active Problems:   CAD CABG 1998, patent grafts 06/06/11 after abn Myoview   Cardiomyopathy, ischemic, EF 30-35% by recent cath   Chronic combined systolic and diastolic congestive heart failure   Acute on chronic combined systolic and diastolic congestive heart failure, NYHA class 3   Possible reccuring Aspiration  pneumonia, worsened by potential CVA   COPD (chronic obstructive pulmonary disease)   Pacemaker, St Jude, implanted 2008   Chronic kidney disease, stage III (moderate)  I agree with diuresis however we'll need to keep his blood pressures stable with the diagnosis of stroke. We have written for low-dose IV Lasix. May need to back off of his beta blocker which should be acceptable if need be. I do agree to check an echocardiogram as his EF has been fluctuating between 30-40% of late. I don't think this can help as far as any cardioembolic phenomenon for stroke but would help for CHF standpoint. Unfortunately with his blood pressure is borderline which may limit diuresis. We will  follow along with you and assist with CHF management.   MD Time with pt: 10 min  Zyair Rhein W, M.D., M.S. [facility] [address] [place], [place]  [address]  [phone] Pager # [phone] 10/01/2012 3:46 PM

## 2012-10-01 NOTE — H&P (Signed)
[facility] History and Physical  Richard Chavez ZOX:[id number] DOB: March 16, 1927 DOA: 10/01/2012  Referring physician: Blinda Chavez PCP: Richard Headings, MD  Specialists: cardiology  Chief Complaint: Shortness of breath  HPI: Richard Chavez is a 79 y.o. male inclusive of CAD, NICM [echo Ef 35-40% 3/13] s/p PPM placement and recent hospitalization from 09/15/12-09/17/12 for community-acquired pneumonia we presented to [facility] emergency room 09/23/12 with weakness and shortness of breath. Patient is a poor historian at baseline and his son Richard Chavez gives most of the story. Patient's son states that patient seemed to be struggling behind yesterday and because of this as well as some numbness in his left hand he went to his primary care physician. Her sons report patient did not receive any antibiotics and was thought that this was probably an early bronchitis. This morning the patient awoke with chills was feeling weak and had some mild productive sputum as well as self-reported temperature and Patient was brought to [facility] emergency room. Patient does not report any chest pain or nausea and has to be careful when he eats as if he eats too fast he gets choked up. He has no cough or needing however. He usually is able to walk about a half mile without issue however he had to walk very slowly into the care physician's office on 5/27 because of weakness. Patient has not had any seizures or any focal neurological deficit that has been reported percent. Son states that his usual weight fluctuates between 233 and 240 pounds and he was about 241 pounds yesterday.  Emergency room evaluation revealed normal urine analysis Chest x-ray showed worsening aeration especially on the right suspicious for alveolar pulmonary edema versus multifocal infection ABG was performed showing some hypoxia with PA O2 of 72% B. and creatinine was 20/1.42 and had some mild blood and anemia ProBNP was 1059 White count was  normal  Review of Systems: The patient denies in addition to above, nausea vomiting diarrhea dysuria dark stool tarry stool-patient has poor vision at baseline and had Lasix surgery about 4 years ago. Son reports patient does snore at night.  Past Medical History  Diagnosis Date  . CAD (coronary artery disease)   . Hypertension   . Hyperlipidemia   . GERD (gastroesophageal reflux disease)   . COPD (chronic obstructive pulmonary disease)   . CRF (chronic renal failure)   . Barrett's esophagus   . OP (osteoporosis)   . CAD (coronary artery disease) of artery bypass graft 07/04/2011    LIMA-LAD; SVG-OM2-OM3, SVG-RCA -- all patent as of 05/27/11  . Syncope 07/04/2011  . Pacemaker 07/04/2011  . Cardiomyopathy, ischemic 07/07/2011    2D Echo - EF 35-40, moderately dilated left atrium  . Myocardial infarction     25 yrs ago  . Peripheral vascular disease   . Arthritis   . BPH (benign prostatic hyperplasia)   . Polyp of colon     removed  . Sleep apnea     STOP BANG SCORE 4  . AV block, 3rd degree     Post St. Jude pacemaker, EF 35-40, does have intermittent atrial tachycardia, either PAt or Afib  . BPH (benign prostatic hypertrophy) 11/15/2011  . SOB (shortness of breath) on exertion 02/14/2010    2D Echo - EF 35-45,    Chart review Admission 09/15/12 for Pneumonia in a setting of mild CHF Admission 05/16/12 for RUL pna + hypotension Admission 11/15/11 for TURP Admission 07/21/11 with syncope-S/p PPM implanted 2008 Admission 07/04/11 c ?  UTi vs hyponatrmia causing malaise Admission 04/22/12 for symptomatic cholelthisasis H/o CAD 1998 patient grafts on 06/06/11  Past Surgical History  Procedure Laterality Date  . Pacemaker placement  2007  . Cholecystectomy  2005  . Coronary artery bypass graft  1978  . Lesion excision      from penis  . Cardiac catheterization  06/06/2011  . Cardiac catheterization  06/14/2010    LIMA to LAD, SVG to RCA, SVG to OM1 Circumflex, 100% occluded RCA, 100%  occluded circumfkex, 100% occluded LAD, no evidence of graft dysfunction, continue medical therapy  . Cardiac catheterization  04/12/2003    3 vessel coronary artery disease, continue medical treatment  . Cardiac catheterization  10/07/2000    Severe 3 vessel coronary artery disease, continue medical treatment   History  Substance Use Topics  . Smoking status: Former Smoker -- 0.30 packs/day for 15 years    Types: Cigarettes    Quit date: 05/07/1985  . Smokeless tobacco: Never Used  . Alcohol Use: No    No Known Allergies  Family History  Problem Relation Age of Onset  . Leukemia Father   . Heart disease Father   . Alzheimer's disease Mother   . Arthritis Mother   . Lung cancer Son 50  . Alzheimer's disease Maternal Grandmother     Prior to Admission medications   Medication Sig Start Date End Date Taking? Authorizing Provider  albuterol-ipratropium (COMBIVENT) 18-103 MCG/ACT inhaler Inhale 2 puffs into the lungs 2 (two) times daily.    Yes Historical Provider, MD  atorvastatin (LIPITOR) 20 MG tablet Take 20 mg by mouth daily with breakfast.    Yes Historical Provider, MD  fluticasone (FLONASE) 50 MCG/ACT nasal spray Place 2 sprays into the nose 2 (two) times daily as needed. For dryness. 03/09/11  Yes Historical Provider, MD  Fluticasone-Salmeterol (ADVAIR DISKUS) 500-50 MCG/DOSE AEPB Inhale 1 puff into the lungs every 12 (twelve) hours.    Yes Historical Provider, MD  furosemide (LASIX) 40 MG tablet Take 1 tablet (40 mg total) by mouth daily with breakfast. 05/23/12  Yes Penny Pia, MD  gabapentin (NEURONTIN) 300 MG capsule Take 600 mg by mouth 3 (three) times daily.  02/07/11  Yes Historical Provider, MD  guaiFENesin (MUCINEX) 600 MG 12 hr tablet Take 1,200 mg by mouth 2 (two) times daily.   Yes Historical Provider, MD  loratadine (CLARITIN) 10 MG tablet Take 10 mg by mouth daily with breakfast.    Yes Historical Provider, MD  Magnesium Chloride (SLOW-MAG) 535 (64 MG) MG TBCR Take  1 tablet by mouth 2 (two) times daily.    Yes Historical Provider, MD  metoprolol tartrate (LOPRESSOR) 25 MG tablet Take 25 mg by mouth 2 (two) times daily.   Yes Historical Provider, MD  Omega-3 Fatty Acids (FISH OIL) 1000 MG CAPS Take 1 capsule by mouth daily.    Yes Historical Provider, MD  potassium chloride (K-DUR) 10 MEQ tablet Take 10 mEq by mouth 2 (two) times daily.    Yes Historical Provider, MD  predniSONE (DELTASONE) 5 MG tablet Take 5 mg by mouth daily.   Yes Historical Provider, MD  ranitidine (ZANTAC) 150 MG capsule Take 150 mg by mouth 2 (two) times daily.    Yes Historical Provider, MD  tiotropium (SPIRIVA) 18 MCG inhalation capsule Place 18 mcg into inhaler and inhale daily with breakfast.    Yes Historical Provider, MD  Vitamin D, Ergocalciferol, (DRISDOL) 50000 UNITS CAPS Take 50,000 Units by mouth every 7 (seven) days.  Take on Fridays   Yes Historical Provider, MD  vitamin E 1000 UNIT capsule Take 1,000 Units by mouth daily with breakfast.    Yes Historical Provider, MD  levofloxacin (LEVAQUIN) 750 MG tablet Take 1 tablet (750 mg total) by mouth every other day. Start on 09/18/2012. 09/18/12   Elease Etienne, MD   Physical Exam: Filed Vitals:   10/01/12 1015 10/01/12 1023 10/01/12 1100 10/01/12 1154  BP: 102/48 89/38 105/40 98/50  Pulse: 61 92 84 82  Temp:      TempSrc:      Resp: 27 16 17 22   Height:      Weight:      SpO2: 92% 93% 91% 95%     General:  Alert but somewhat unclear conversation.   Eyes: Extraocular movements intact, vision by direct confrontation is normal   ENT: Uvula midline some slight droop of the left side of face, however patient wears dentures and is not wearing them currently   Neck: Soft supple and cannot assess JVD   Cardiovascular: S1-S2 no murmur rub or gallop, midline scar to chest   Respiratory: Rales throughout no tactile vocal resonance or fremitus  Abdomen:  Soft nontender nondistended, obese no rebound or guarding  Skin:   Grade 2-3 lower extremity pitting edema  Musculoskeletal:  Range of Motion intact  Psychiatric:  Euthymic  Neurologic:  Power 2 left upper extremity and shoulder wrist and bicep tricep is 0/5. Sensation seems to be intact. Patient and past point with the right hand without deficit. Vision by direct confrontation is normal. Funduscopy deferred. Power left lower extremity also seems to be decreased compared to right side, probably 1-2/5. Reflexes are brisk bilaterally. Plantars are downgoing. Sensation is intact. Gait was not assessed. No cerebellar findings noted.  Labs on Admission:  Basic Metabolic Panel:  Recent Labs Lab 10/01/12 0846  NA 136  K 4.2  CL 99  CO2 22  GLUCOSE 88  BUN 20  CREATININE 1.42*  CALCIUM 8.8   Liver Function Tests:  Recent Labs Lab 10/01/12 0846  AST 14  ALT 12  ALKPHOS 59  BILITOT 0.9  PROT 6.6  ALBUMIN 3.1*   No results found for this basename: LIPASE, AMYLASE,  in the last 168 hours No results found for this basename: AMMONIA,  in the last 168 hours CBC:  Recent Labs Lab 10/01/12 0846  WBC 8.8  NEUTROABS 7.3  HGB 14.4  HCT 43.6  MCV 87.2  PLT 210   Cardiac Enzymes:  Recent Labs Lab 10/01/12 0849  TROPONINI <0.30    BNP (last 3 results)  Recent Labs  05/16/12 1305 09/15/12 1130 10/01/12 0849  PROBNP 2313.0* 1760.0* 1059.0*   CBG: No results found for this basename: GLUCAP,  in the last 168 hours  Radiological Exams on Admission: Dg Chest 2 View  10/01/2012   *RADIOLOGY REPORT*  Clinical Data: Shortness of breath.  CABG.  CHEST - 2 VIEW  Comparison: 09/15/2012  Findings: Motion and position degraded lateral view.  Aortic atherosclerosis. Pacer with leads at right atrium and right ventricle.  No lead discontinuity.  Cardiomegaly accentuated by AP portable technique.  No definite pleural fluid. No pneumothorax.  Similar to minimal improvement in interstitial and airspace disease involving the mid and inferior left lung.   Worsened right-sided aeration with lower lobe predominant interstitial and airspace disease. Somewhat to more confluent inferior right upper lobe opacity is favored to represent an area of scarring.  IMPRESSION: Worsened aeration, especially on the  right.  Suspicious for alveolar pulmonary edema versus multifocal infection.  Degraded exam, especially the lateral view.   Original Report Authenticated By: Jeronimo Greaves, M.D.    EKG: Independently reviewed.  EKG shows sinus rhythm with PVCs, PR interval 0.08, QRS axis 15, no ST-T wave elevations compared to prior  Assessment/Plan Principal Problem:   Stroke, lacunar Active Problems:   COPD (chronic obstructive pulmonary disease)   CAD CABG 1998, patent grafts 06/06/11 after abn Myoview   Pacemaker, St Jude, implanted 2008   Cardiomyopathy, ischemic, EF 30-35% by recent cath   Chronic kidney disease, stage III (moderate)   CHF (congestive heart failure), NYHA class II   Possible reccuring Aspiration pneumonia, worsened by potential CVA   1. Possible stroke-I will order a stat CT scan of the brain to rule out organic pathology. I have consulted Dr. Amada Jupiter of neurology who has kindly agreed to see patient in consult. Further tests to be ordered as per discussion of neurologist. 2. Shortness of breath-multifactorial-potentially aspiration pneumonia +/-CHF exacerbation-patient's dry weight is 233 pounds. He would need to be weighed daily, strict in and out, Lasix IV 40 mg twice a day, hold for blood pressure below 100/50. See below.  Hold steroid dose ordered apparently 5/27 3. Potential aspiration pneumonia  Agree with empiric Vancomycin/Zosyn for now-would change antibiotics based on two-view chest x-ray to be obtained 10/02/12. Will get speech therapist input to rule out overt oropharyngeal dysphagia 4. History of CAD/cardiomyopathy/CHF NYHA class II-have requested [facility] cardiology to consult as he has advanced heart failure which is both  systolic and diastolic as per last echocardiogram. Given history of #1, will order echocardiogram to rule out any concern for embolus 5. COPD history-continue Combivent inhaler 2 puff twice a day.  Hold prednisone 5 mg tablet.  Continue tiotropium 18 mcg daily every morning 6. Hypotension-seems like a chronic issue. Discussed with Dr. Amada Jupiter who recommends not place patient on Crestor is just to prevent reperfusion/is she make injury from possible stroke if stroke is found. Will hold metoprolol 25 mg twice a day for now 7. Hyperlipidemia continue Lipitor 20 mg daily with breakfast  Cardiology/neurology consulted  Code Status:  Full  Family Communication:  Long discussion with son at bedside  Disposition Plan:  Inpatient , SDU  Time spent: 74  Mahala Menghini [facility] Pager [phone]  If 7PM-7AM, please contact night-coverage [email or web address] Password [email or web address] 10/01/2012, 12:12 PM

## 2012-10-01 NOTE — Care Management Note (Signed)
    Page 1 of 1   10/01/2012     4:30:16 PM   CARE MANAGEMENT NOTE 10/01/2012  Patient:  Richard Chavez, Richard Chavez   Account Number:  [id number]  Date Initiated:  10/01/2012  Documentation initiated by:  Junius Creamer  Subjective/Objective Assessment:   adm w chv vs pneumonia     Action/Plan:   lives w son   Anticipated DC Date:     Anticipated DC Plan:    In-house referral  Clinical Social Worker      DC Associate Professor  CM consult      Choice offered to / List presented to:             Status of service:   Medicare Important Message given?   (If response is "NO", the following Medicare IM given date fields will be blank) Date Medicare IM given:   Date Additional Medicare IM given:    Discharge Disposition:    Per UR Regulation:  Reviewed for med. necessity/level of care/duration of stay  If discussed at Long Length of Stay Meetings, dates discussed:    Comments:  5/28 1629 Richard Kenley Rettinger rn,bsn sw in ed seeing pt for snf. sw ref to unit sw made.

## 2012-10-01 NOTE — ED Notes (Signed)
Patients son reports that patient just got out of the hospital for PNA  X 2 weeks ago.   Patient started feeling increasingly weak in the last few days.   Patient went to his PCP yesterday and was told still has "a touch of PNA".   Patient felt worse today.  Patient unable to walk today secondary to weakness.

## 2012-10-01 NOTE — Progress Notes (Signed)
Bladder scan done-126ml- in and out cath not done.

## 2012-10-01 NOTE — ED Provider Notes (Addendum)
History     CSN: [id number]  Arrival date & time 10/01/12  2952   First MD Initiated Contact with Patient 10/01/12 [phone]      Chief Complaint  Patient presents with  . Weakness    (Consider location/radiation/quality/duration/timing/severity/associated sxs/prior treatment) HPI Comments: Presents to the ER by ambulance from home with complaints of generalized weakness. This reports symptoms began 2 days ago. He has had progressively worsening weakness and dizziness. Patient denies chest pain. He does not feel short of breath. Patient has had 2 recent hospitalizations for pneumonia. Son reports that he has started coughing this morning, nonproductive. Has not noticed any fever. Patient has a history of heart disease and congestive heart failure, has had increasing swelling of his legs recently.  Patient is a 79 y.o. male presenting with weakness.  Weakness Pertinent negatives include no chest pain.    Past Medical History  Diagnosis Date  . CAD (coronary artery disease)   . Hypertension   . Hyperlipidemia   . GERD (gastroesophageal reflux disease)   . COPD (chronic obstructive pulmonary disease)   . CRF (chronic renal failure)   . Barrett's esophagus   . OP (osteoporosis)   . CAD (coronary artery disease) of artery bypass graft 07/04/2011    LIMA-LAD; SVG-OM2-OM3, SVG-RCA -- all patent as of 05/27/11  . Syncope 07/04/2011  . Pacemaker 07/04/2011  . Cardiomyopathy, ischemic 07/07/2011    2D Echo - EF 35-40, moderately dilated left atrium  . Myocardial infarction     25 yrs ago  . Peripheral vascular disease   . Arthritis   . BPH (benign prostatic hyperplasia)   . Polyp of colon     removed  . Sleep apnea     STOP BANG SCORE 4  . AV block, 3rd degree     Post [employer] pacemaker, EF 35-40, does have intermittent atrial tachycardia, either PAt or Afib  . BPH (benign prostatic hypertrophy) 11/15/2011  . SOB (shortness of breath) on exertion 02/14/2010    2D Echo - EF 35-45,      Past Surgical History  Procedure Laterality Date  . Pacemaker placement  2007  . Cholecystectomy  2005  . Coronary artery bypass graft  1978  . Lesion excision      from penis  . Cardiac catheterization  06/06/2011  . Cardiac catheterization  06/14/2010    LIMA to LAD, SVG to RCA, SVG to OM1 Circumflex, 100% occluded RCA, 100% occluded circumfkex, 100% occluded LAD, no evidence of graft dysfunction, continue medical therapy  . Cardiac catheterization  04/12/2003    3 vessel coronary artery disease, continue medical treatment  . Cardiac catheterization  10/07/2000    Severe 3 vessel coronary artery disease, continue medical treatment    Family History  Problem Relation Age of Onset  . Leukemia Father   . Heart disease Father   . Alzheimer's disease Mother   . Arthritis Mother   . Lung cancer Son 51  . Alzheimer's disease Maternal Grandmother     History  Substance Use Topics  . Smoking status: Former Smoker -- 0.30 packs/day for 15 years    Types: Cigarettes    Quit date: 05/07/1985  . Smokeless tobacco: Never Used  . Alcohol Use: No      Review of Systems  Cardiovascular: Positive for leg swelling. Negative for chest pain.  Neurological: Positive for weakness.  All other systems reviewed and are negative.    Allergies  Review of patient's allergies indicates no known  allergies.  Home Medications   Current Outpatient Rx  Name  Route  Sig  Dispense  Refill  . albuterol-ipratropium (COMBIVENT) 18-103 MCG/ACT inhaler   Inhalation   Inhale 2 puffs into the lungs 2 (two) times daily.          Marland Kitchen atorvastatin (LIPITOR) 20 MG tablet   Oral   Take 20 mg by mouth daily with breakfast.          . fluticasone (FLONASE) 50 MCG/ACT nasal spray   Nasal   Place 2 sprays into the nose 2 (two) times daily as needed. For dryness.         . Fluticasone-Salmeterol (ADVAIR DISKUS) 500-50 MCG/DOSE AEPB   Inhalation   Inhale 1 puff into the lungs every 12 (twelve) hours.           . furosemide (LASIX) 40 MG tablet   Oral   Take 1 tablet (40 mg total) by mouth daily with breakfast.   30 tablet      . gabapentin (NEURONTIN) 300 MG capsule   Oral   Take 600 mg by mouth 3 (three) times daily.          Marland Kitchen guaiFENesin (MUCINEX) 600 MG 12 hr tablet   Oral   Take 1,200 mg by mouth 2 (two) times daily.         Marland Kitchen levofloxacin (LEVAQUIN) 750 MG tablet   Oral   Take 1 tablet (750 mg total) by mouth every other day. Start on 09/18/2012.   2 tablet   0   . loratadine (CLARITIN) 10 MG tablet   Oral   Take 10 mg by mouth daily with breakfast.          . Magnesium Chloride (SLOW-MAG) 535 (64 MG) MG TBCR   Oral   Take 1 tablet by mouth 2 (two) times daily.          . metoprolol tartrate (LOPRESSOR) 25 MG tablet   Oral   Take 25 mg by mouth 2 (two) times daily.         . Omega-3 Fatty Acids (FISH OIL) 1000 MG CAPS   Oral   Take 1 capsule by mouth daily.          . potassium chloride (K-DUR) 10 MEQ tablet   Oral   Take 10 mEq by mouth 2 (two) times daily.          . predniSONE (DELTASONE) 5 MG tablet   Oral   Take 5 mg by mouth daily.         . ranitidine (ZANTAC) 150 MG capsule   Oral   Take 150 mg by mouth 2 (two) times daily.          Marland Kitchen tiotropium (SPIRIVA) 18 MCG inhalation capsule   Inhalation   Place 18 mcg into inhaler and inhale daily with breakfast.          . Vitamin D, Ergocalciferol, (DRISDOL) 50000 UNITS CAPS   Oral   Take 50,000 Units by mouth every 7 (seven) days. Take on Fridays         . vitamin E 1000 UNIT capsule   Oral   Take 1,000 Units by mouth daily with breakfast.            There were no vitals taken for this visit.  Physical Exam  Constitutional: He is oriented to person, place, and time. He appears well-developed and well-nourished. No distress.  HENT:  Head: Normocephalic and atraumatic.  Right Ear: Hearing normal.  Left Ear: Hearing normal.  Nose: Nose normal.  Mouth/Throat:  Oropharynx is clear and moist and mucous membranes are normal.  Eyes: Conjunctivae and EOM are normal. Pupils are equal, round, and reactive to light.  Neck: Normal range of motion. Neck supple.  Cardiovascular: Regular rhythm, S1 normal and S2 normal.  Exam reveals no gallop and no friction rub.   No murmur heard. Pulmonary/Chest: Tachypnea noted. He has rales in the right middle field, the right lower field, the left middle field and the left lower field. He exhibits no tenderness.  Abdominal: Soft. Normal appearance and bowel sounds are normal. There is no hepatosplenomegaly. There is no tenderness. There is no rebound, no guarding, no tenderness at McBurney's point and negative Murphy's sign. No hernia.  Musculoskeletal: Normal range of motion. He exhibits edema.  Neurological: He is alert and oriented to person, place, and time. He has normal strength. No cranial nerve deficit or sensory deficit. Coordination normal. GCS eye subscore is 4. GCS verbal subscore is 5. GCS motor subscore is 6.  Skin: Skin is warm, dry and intact. No rash noted. No cyanosis.  Psychiatric: He has a normal mood and affect. His speech is normal and behavior is normal. Thought content normal.    ED Course  Procedures (including critical care time)  EKG  Date: 10/01/2012  Rate: 98  Rhythm: normal sinus rhythm and premature ventricular contractions (PVC)  QRS Axis: normal  Intervals: normal  ST/T Wave abnormalities: normal  Conduction Disutrbances:none  Narrative Interpretation:   Old EKG Reviewed: unchanged    Labs Reviewed  CBC WITH DIFFERENTIAL - Abnormal; Notable for the following:    RDW 16.4 (*)    Neutrophils Relative % 83 (*)    All other components within normal limits  COMPREHENSIVE METABOLIC PANEL - Abnormal; Notable for the following:    Creatinine, Ser 1.42 (*)    Albumin 3.1 (*)    GFR calc non Af Amer 44 (*)    GFR calc Af Amer 51 (*)    All other components within normal limits  PRO  B NATRIURETIC PEPTIDE - Abnormal; Notable for the following:    Pro B Natriuretic peptide (BNP) 1059.0 (*)    All other components within normal limits  CULTURE, BLOOD (ROUTINE X 2)  CULTURE, BLOOD (ROUTINE X 2)  TROPONIN I  URINALYSIS, ROUTINE W REFLEX MICROSCOPIC   Dg Chest 2 View  10/01/2012   *RADIOLOGY REPORT*  Clinical Data: Shortness of breath.  CABG.  CHEST - 2 VIEW  Comparison: 09/15/2012  Findings: Motion and position degraded lateral view.  Aortic atherosclerosis. Pacer with leads at right atrium and right ventricle.  No lead discontinuity.  Cardiomegaly accentuated by AP portable technique.  No definite pleural fluid. No pneumothorax.  Similar to minimal improvement in interstitial and airspace disease involving the mid and inferior left lung.  Worsened right-sided aeration with lower lobe predominant interstitial and airspace disease. Somewhat to more confluent inferior right upper lobe opacity is favored to represent an area of scarring.  IMPRESSION: Worsened aeration, especially on the right.  Suspicious for alveolar pulmonary edema versus multifocal infection.  Degraded exam, especially the lateral view.   Original Report Authenticated By: Jeronimo Greaves, M.D.     Diagnosis: 1. CHF 2. Possible recurrent pneumonia    MDM  Patient comes to the emergency department for evaluation of difficulty breathing. Patient was recently hospitalized approximately 2 weeks ago for pneumonia. He was starting to have slight shortness  of breath yesterday and was seen by his doctor. The doctor said he heard some fluid in his lungs at that time, but was only mild and he was not having any difficulty breathing. Overnight into today he has developed a cough and is now more short of breath. This is similar to his previous presentation.  Chest x-ray shows bilateral opacities that are either edema or possibly multifocal pneumonia. Patient does have a history of congestive heart failure, has significant lower  extremity edema and this might be worsening, decompensated congestive heart failure. Will treat with Lasix. With his recent recurrent pneumonia, however, cannot rule out infectious etiology and will require broad spectrum antibiotic coverage for healthcare associated pneumonia.  Addendum: Discussed with Dr. Mahala Menghini, as requested ABG on BiPAP, which is appropriate. He also has requested I contact cardiology to help determine if the patient is predominantly congestive heart failure versus infection.  Gilda Crease, MD 10/01/12 1154  Gilda Crease, MD 10/01/12 [phone]

## 2012-10-01 NOTE — Consult Note (Addendum)
Referring Physician: Dr. Mahala Menghini    Chief Complaint: Left arm weakness and SOB  HPI:  Richard Chavez is an 79 y.o. male white male with PMH of CAD s/p CABG, CHF EF 30-35%, s/p PPM, HTN, and COPD presenting to the ED today alongside his son with complaints of worsening fatigue, generalized weakness, left arm weakness, SOB, fever, and chills. History is mainly obtained by son, [name], who explains that his father started complaining of left hand numbness yesterday morning ~11am. They went to the PCP office and returned home later that day thought to have numbness secondary to neuropathy. He was still moving his arm and able to drive and walk at that time, however, upon waking up this morning, he was confused, not moving his left arm at all, and had difficulty walking. Mr. Al does not have any similar prior episodes or any history of CVA. His son does claim he takes an ASA daily, however, it is not on his medication list on epic and did not get any ASA this morning.   Of note, he was recently discharged from the hospital on 09/17/12 for CAP on levofloxacin course.    Date last known well: 09/30/12 Time last known well: 11am tPA Given: No: out of window  Past Medical History  Diagnosis Date  . CAD (coronary artery disease), native coronary artery     Status post CABG  . Hypertension   . Hyperlipidemia   . GERD (gastroesophageal reflux disease)   . COPD (chronic obstructive pulmonary disease)   . CRF (chronic renal failure)   . Barrett's esophagus   . OP (osteoporosis)   . CAD (coronary artery disease) of artery bypass graft 07/04/2011    LIMA-LAD; SVG-OM2-OM3, SVG-RCA -- all patent as of 05/27/11  . Syncope 07/04/2011  . Pacemaker 07/04/2011  . Cardiomyopathy, ischemic 07/07/2011    2D Echo - EF 35-40, moderately dilated left atrium  . Myocardial infarction     25 yrs ago  . Peripheral vascular disease   . Arthritis   . BPH (benign prostatic hyperplasia)   . Polyp of colon     removed  .  Sleep apnea     STOP BANG SCORE 4  . AV block, 3rd degree     Post [employer] pacemaker, EF 35-40, does have intermittent atrial tachycardia, either PAt or Afib  . BPH (benign prostatic hypertrophy) 11/15/2011  . SOB (shortness of breath) on exertion 02/14/2010    2D Echo - EF 35-45,    Past Surgical History  Procedure Laterality Date  . Pacemaker placement  2007  . Cholecystectomy  2005  . Coronary artery bypass graft  1978  . Lesion excision      from penis  . Cardiac catheterization  06/06/2011    occluded proximal RCA, ostial LAD and mid Circumflex after OM 1.; [place] LIMA-LAD, patent SVG-OM1-OM 2, patent SVG-RCA. Reduced ejection fraction of 30-35%.  . Cardiac catheterization  06/14/2010    LIMA to LAD, SVG to RCA, SVG to OM1 Circumflex, 100% occluded RCA, 100% occluded circumfkex, 100% occluded LAD, no evidence of graft dysfunction, continue medical therapy  . Cardiac catheterization  04/12/2003    3 vessel coronary artery disease, continue medical treatment  . Cardiac catheterization  10/07/2000    Severe 3 vessel coronary artery disease, continue medical treatment   Family History  Problem Relation Age of Onset  . Leukemia Father   . Heart disease Father   . Alzheimer's disease Mother   . Arthritis  Mother   . Lung cancer Son 34  . Alzheimer's disease Maternal Grandmother    Social History:  reports that he quit smoking about 27 years ago. His smoking use included Cigarettes. He has a 4.5 pack-year smoking history. He has never used smokeless tobacco. He reports that he does not drink alcohol or use illicit drugs.  Allergies: No Known Allergies  Medications:    No current facility-administered medications on file prior to encounter.   Current Outpatient Prescriptions on File Prior to Encounter  Medication Sig Dispense Refill  . albuterol-ipratropium (COMBIVENT) 18-103 MCG/ACT inhaler Inhale 2 puffs into the lungs 2 (two) times daily.       Marland Kitchen atorvastatin (LIPITOR) 20 MG  tablet Take 20 mg by mouth daily with breakfast.       . fluticasone (FLONASE) 50 MCG/ACT nasal spray Place 2 sprays into the nose 2 (two) times daily as needed. For dryness.      . Fluticasone-Salmeterol (ADVAIR DISKUS) 500-50 MCG/DOSE AEPB Inhale 1 puff into the lungs every 12 (twelve) hours.       . furosemide (LASIX) 40 MG tablet Take 1 tablet (40 mg total) by mouth daily with breakfast.  30 tablet    . gabapentin (NEURONTIN) 300 MG capsule Take 600 mg by mouth 3 (three) times daily.       Marland Kitchen guaiFENesin (MUCINEX) 600 MG 12 hr tablet Take 1,200 mg by mouth 2 (two) times daily.      Marland Kitchen loratadine (CLARITIN) 10 MG tablet Take 10 mg by mouth daily with breakfast.       . Magnesium Chloride (SLOW-MAG) 535 (64 MG) MG TBCR Take 1 tablet by mouth 2 (two) times daily.       . metoprolol tartrate (LOPRESSOR) 25 MG tablet Take 25 mg by mouth 2 (two) times daily.      . Omega-3 Fatty Acids (FISH OIL) 1000 MG CAPS Take 1 capsule by mouth daily.       . potassium chloride (K-DUR) 10 MEQ tablet Take 10 mEq by mouth 2 (two) times daily.       . predniSONE (DELTASONE) 5 MG tablet Take 5 mg by mouth daily.      . ranitidine (ZANTAC) 150 MG capsule Take 150 mg by mouth 2 (two) times daily.       Marland Kitchen tiotropium (SPIRIVA) 18 MCG inhalation capsule Place 18 mcg into inhaler and inhale daily with breakfast.       . Vitamin D, Ergocalciferol, (DRISDOL) 50000 UNITS CAPS Take 50,000 Units by mouth every 7 (seven) days. Take on Fridays      . vitamin E 1000 UNIT capsule Take 1,000 Units by mouth daily with breakfast.       . levofloxacin (LEVAQUIN) 750 MG tablet Take 1 tablet (750 mg total) by mouth every other day. Start on 09/18/2012.  2 tablet  0   Review of Systems:  Constitutional:  Fever, chills, fatigue.  Denies diaphoresis, appetite change.   HEENT:  Denies congestion, sore throat, rhinorrhea, sneezing, mouth sores, trouble swallowing, neck pain   Respiratory:  SOB, DOE, Wheezing.  Denies cough.  Cardiovascular:   Leg swelling.  Denies palpitations.  Gastrointestinal:  Denies nausea, vomiting, abdominal pain, diarrhea, constipation, blood in stool and abdominal distention.   Genitourinary:  Denies dysuria, urgency, frequency, hematuria, flank pain and difficulty urinating.   Musculoskeletal:  Gait problem, left arm and leg weakness, b/l leg pain.  Denies myalgias, back pain, joint swelling, arthralgias.  Skin:  Denies pallor, rash  and wound.   Neurological:  Weakness especially L upper and lower extremity, left hand numbness.  Denies dizziness, seizures, syncope, light-headedness, and headaches.    Neurologic Examination:                                                                                                      Blood pressure 116/55, pulse 82, temperature 98.2 F (36.8 C), temperature source Oral, resp. rate 22, height 6\' 2"  (1.88 m), weight 241 lb (109.317 kg), SpO2 97.00%.  Mental Status: confused and decreased concentration but alert and oriented to person, place, and time.  Slurred/mumbled speech.  Able to follow 3 step commands with minimal difficulty. Cranial Nerves: II: Discs flat bilaterally; Visual fields grossly normal, pupils equal, round, reactive to light. III,IV, VI: ptosis not present, crosses midline with horizontal saccade, difficulty with smooth pursuit.   V,VII: smile asymmetric, left sided droop with air leak, facial light touch sensation normal bilaterally VIII: hearing normal bilaterally IX,X: gag reflex not tested XI: right shoulder shrug intact, unable to shrug left shoulder.  XII: midline tongue extension Motor: Right : Upper extremity   5/5    Left:     Upper extremity   1/5, slight proximal left arm movement, no distal movement  Lower extremity   5/5     Lower extremity   2/5 Tone and bulk:normal tone throughout; no atrophy noted Sensory: light touch intact throughout, bilaterally Deep Tendon Reflexes: 3+ L knee, +1 right knee, ankle and brachioradialis reflexes  2+ intact and symmetric Plantars: Right: downgoing   Left: downgoing Cerebellar: abnormal finger-to-nose,  Unable to do heel-to-shin test, decreased ability to bend left knee Gait: unable to be assessed CV: pulses palpable throughout   Results for orders placed during the hospital encounter of 10/01/12 (from the past 48 hour(s))  CBC WITH DIFFERENTIAL     Status: Abnormal   Collection Time    10/01/12  8:46 AM      Result Value Range   WBC 8.8  4.0 - 10.5 K/uL   RBC 5.00  4.22 - 5.81 MIL/uL   Hemoglobin 14.4  13.0 - 17.0 g/dL   HCT 16.1  09.6 - 04.5 %   MCV 87.2  78.0 - 100.0 fL   MCH 28.8  26.0 - 34.0 pg   MCHC 33.0  30.0 - 36.0 g/dL   RDW 40.9 (*) 81.1 - 91.4 %   Platelets 210  150 - 400 K/uL   Neutrophils Relative % 83 (*) 43 - 77 %   Neutro Abs 7.3  1.7 - 7.7 K/uL   Lymphocytes Relative 13  12 - 46 %   Lymphs Abs 1.1  0.7 - 4.0 K/uL   Monocytes Relative 3  3 - 12 %   Monocytes Absolute 0.3  0.1 - 1.0 K/uL   Eosinophils Relative 1  0 - 5 %   Eosinophils Absolute 0.1  0.0 - 0.7 K/uL   Basophils Relative 0  0 - 1 %   Basophils Absolute 0.0  0.0 - 0.1 K/uL  COMPREHENSIVE METABOLIC PANEL  Status: Abnormal   Collection Time    10/01/12  8:46 AM      Result Value Range   Sodium 136  135 - 145 mEq/L   Potassium 4.2  3.5 - 5.1 mEq/L   Chloride 99  96 - 112 mEq/L   CO2 22  19 - 32 mEq/L   Glucose, Bld 88  70 - 99 mg/dL   BUN 20  6 - 23 mg/dL   Creatinine, Ser 1.61 (*) 0.50 - 1.35 mg/dL   Calcium 8.8  8.4 - 09.6 mg/dL   Total Protein 6.6  6.0 - 8.3 g/dL   Albumin 3.1 (*) 3.5 - 5.2 g/dL   AST 14  0 - 37 U/L   ALT 12  0 - 53 U/L   Alkaline Phosphatase 59  39 - 117 U/L   Total Bilirubin 0.9  0.3 - 1.2 mg/dL   GFR calc non Af Amer 44 (*) >90 mL/min   GFR calc Af Amer 51 (*) >90 mL/min   Comment:            The eGFR has been calculated     using the CKD EPI equation.     This calculation has not been     validated in all clinical     situations.     eGFR's  persistently     <90 mL/min signify     possible Chronic Kidney Disease.  PRO B NATRIURETIC PEPTIDE     Status: Abnormal   Collection Time    10/01/12  8:49 AM      Result Value Range   Pro B Natriuretic peptide (BNP) 1059.0 (*) 0 - 450 pg/mL  TROPONIN I     Status: None   Collection Time    10/01/12  8:49 AM      Result Value Range   Troponin I <0.30  <0.30 ng/mL   Comment:            Due to the release kinetics of cTnI,     a negative result within the first hours     of the onset of symptoms does not rule out     myocardial infarction with certainty.     If myocardial infarction is still suspected,     repeat the test at appropriate intervals.  URINALYSIS, ROUTINE W REFLEX MICROSCOPIC     Status: None   Collection Time    10/01/12  9:25 AM      Result Value Range   Color, Urine YELLOW  YELLOW   APPearance CLEAR  CLEAR   Specific Gravity, Urine 1.015  1.005 - 1.030   pH 7.0  5.0 - 8.0   Glucose, UA NEGATIVE  NEGATIVE mg/dL   Hgb urine dipstick NEGATIVE  NEGATIVE   Bilirubin Urine NEGATIVE  NEGATIVE   Ketones, ur NEGATIVE  NEGATIVE mg/dL   Protein, ur NEGATIVE  NEGATIVE mg/dL   Urobilinogen, UA 1.0  0.0 - 1.0 mg/dL   Nitrite NEGATIVE  NEGATIVE   Leukocytes, UA NEGATIVE  NEGATIVE   Comment: MICROSCOPIC NOT DONE ON URINES WITH NEGATIVE PROTEIN, BLOOD, LEUKOCYTES, NITRITE, OR GLUCOSE <1000 mg/dL.  POCT I-STAT 3, BLOOD GAS (G3+)     Status: Abnormal   Collection Time    10/01/12 12:22 PM      Result Value Range   pH, Arterial 7.386  7.350 - 7.450   pCO2 arterial 38.8  35.0 - 45.0 mmHg   pO2, Arterial 72.0 (*) 80.0 - 100.0 mmHg  Bicarbonate 23.3  20.0 - 24.0 mEq/L   TCO2 24  0 - 100 mmol/L   O2 Saturation 94.0     Acid-base deficit 2.0  0.0 - 2.0 mmol/L   Patient temperature 98.6 F     Collection site RADIAL, ALLEN'S TEST ACCEPTABLE     Drawn by Operator     Sample type ARTERIAL     Dg Chest 2 View  10/01/2012   *RADIOLOGY REPORT*  Clinical Data: Shortness of  breath.  CABG.  CHEST - 2 VIEW  Comparison: 09/15/2012  Findings: Motion and position degraded lateral view.  Aortic atherosclerosis. Pacer with leads at right atrium and right ventricle.  No lead discontinuity.  Cardiomegaly accentuated by AP portable technique.  No definite pleural fluid. No pneumothorax.  Similar to minimal improvement in interstitial and airspace disease involving the mid and inferior left lung.  Worsened right-sided aeration with lower lobe predominant interstitial and airspace disease. Somewhat to more confluent inferior right upper lobe opacity is favored to represent an area of scarring.  IMPRESSION: Worsened aeration, especially on the right.  Suspicious for alveolar pulmonary edema versus multifocal infection.  Degraded exam, especially the lateral view.   Original Report Authenticated By: Jeronimo Greaves, M.D.   Ct Head Wo Contrast  10/01/2012   *RADIOLOGY REPORT*  Clinical Data: Progressive generalized weakness, dizziness  CT HEAD WITHOUT CONTRAST  Technique:  Contiguous axial images were obtained from the base of the skull through the vertex without contrast.  Comparison: Prior CT scan of the head 05/16/2012  Findings: No acute intracranial hemorrhage, acute infarction, mass lesion, mass effect, midline shift or hydrocephalus.  Gray-white differentiation is preserved throughout.  Stable mild generalized atrophy and periventricular hypoattenuation which is nonspecific but most consistent with the sequela of chronic microvascular ischemic change.  The globes and orbits are intact and symmetric bilaterally.  No acute soft tissue or calvarial abnormality. Normal aeration of the mastoid air cells and visualized paranasal sinuses.  Dense atherosclerotic vascular calcification in the bilateral cavernous carotid arteries.  IMPRESSION:  1.  No acute intracranial abnormality 2.  Stable mild atrophy and chronic white matter ischemic changes 3.  Intracranial atherosclerosis   Original Report  Authenticated By: Malachy Moan, M.D.   Assessment and plan discussed with with attending physician, Dr. Amada Jupiter and they are in agreement.     Assessment: TAISHAWN SMALDONE is an 79 y.o. male with PMH of CAD s/p CABG, CHF EF 30-35%, s/p PPM, HTN, and COPD presenting with L sided weakness, facial droop, confusion, slurred speech, and admitted for ?worsening PNA vs CHF exacerbation.  Suspect Mr. Halloran to have R sided subcortical infarct given clinical presentation and predominantly motor dysfunction.  CT head on admission negative for any acute intracranial abnormality.  Cannot get MRI due to PPM in place. Has been on ASA at home.   Stroke Risk Factors - hyperlipidemia and hypertension  Plan: 1. HgbA1c, fasting lipid panel 2. PT consult, OT consult, Speech consult 3. Echocardiogram 4. Carotid dopplers 5. Prophylactic therapy-Antiplatelet med: Continue Aspirin - dose 325mg ; consider transition to Plavix near time of discharge or other indicated therapy.  6. Risk factor modification 7. Telemetry monitoring 8. Frequent neuro checks  Signed: Darden Palmer, MD PGY-I, Internal Medicine Resident Pager: [phone]  10/01/2012,3:58 PM    I have seen and evaluated the patient. I have reviewed the above note and agree with the above.   SH: Stopped smoking 25 yrs ago  79 yo M who complains of left sided numbness/weakness  since yesterday, on discussing, it sounds like he was more weak than actually numb yesterday. He continues to have weakness that was much worse on awakening. On exam he has a dense left hemiparesis affecting arm > face >leg. I suspect a small subcortical stroke in this gentleman with a myriad of atherosclerotic risk factors. HE will need to be admitted for a stroke workup as outlined above.   Ritta Slot, MD [facility] [phone]  If 7pm- 7am, please page neurology on call at [phone].

## 2012-10-01 NOTE — Progress Notes (Signed)
eLink Physician-Brief Progress Note Patient Name: Richard Chavez DOB: 1927-03-26 MRN: [id number]  Date of Service  10/01/2012   HPI/Events of Note     eICU Interventions  SCds for DVt proph Consider sq heparin if not contraindicated   Intervention Category Intermediate Interventions: Best-practice therapies (e.g. DVT, beta blocker, etc.)  Gillian Meeuwsen V. 10/01/2012, 6:35 PM

## 2012-10-01 NOTE — Progress Notes (Signed)
Clinical Social Work Department BRIEF PSYCHOSOCIAL ASSESSMENT 10/01/2012  Patient:  Richard Chavez, Richard Chavez     Account Number:  [id number]     Admit date:  10/01/2012  Clinical Social Worker:  Leron Croak, CLINICAL SOCIAL WORKER  Date/Time:  10/01/2012 02:46 PM  Referred by:  Physician  Date Referred:  10/01/2012 Referred for  SNF Placement   Other Referral:   Interview type:  Family Other interview type:    PSYCHOSOCIAL DATA Living Status:  WITH ADULT CHILDREN Admitted from facility:   Level of care:   Primary support name:  Ples Specter     [phone] Primary support relationship to patient:  CHILD, ADULT Degree of support available:   Pt has some support from son and church family.    CURRENT CONCERNS Current Concerns  Post-Acute Placement   Other Concerns:    SOCIAL WORK ASSESSMENT / PLAN CSW met with the son in the [facility] ED for SNF referral. Pt is currently no available for assessment due to medical procedure. Pt son stated that Pt still has capacity, however he is the [facility] when needed.    [name] son stated that he currently lives with his dad and helps take care of him. [name] son stated that Pt has been weak and having "troubled breathing". [name] son is concerned about [name] health and agreeable to help Pt with SNF listing and placement needs if needed.  CSW unable to meet with Pt at this time and Unit CSW will f/u with Pt.   Assessment/plan status:  Information/Referral to [facility] Other assessment/ plan:   Information/referral to community resources:   CSW provided a listing of SNF's in [place] area for possible placement.    PATIENT'S/FAMILY'S RESPONSE TO PLAN OF CARE: Pt ans family appreciative of assistance with d/c planning.      Leron Croak, LCSWA [facility] Emergency Dept.  [phone]

## 2012-10-01 NOTE — ED Notes (Signed)
Patient given urinal per request.

## 2012-10-02 ENCOUNTER — Inpatient Hospital Stay (HOSPITAL_COMMUNITY): Payer: Medicare Other

## 2012-10-02 DIAGNOSIS — I379 Nonrheumatic pulmonary valve disorder, unspecified: Secondary | ICD-10-CM

## 2012-10-02 DIAGNOSIS — I509 Heart failure, unspecified: Secondary | ICD-10-CM

## 2012-10-02 DIAGNOSIS — I6350 Cerebral infarction due to unspecified occlusion or stenosis of unspecified cerebral artery: Secondary | ICD-10-CM

## 2012-10-02 DIAGNOSIS — I25810 Atherosclerosis of coronary artery bypass graft(s) without angina pectoris: Secondary | ICD-10-CM

## 2012-10-02 DIAGNOSIS — J690 Pneumonitis due to inhalation of food and vomit: Secondary | ICD-10-CM

## 2012-10-02 DIAGNOSIS — R5381 Other malaise: Secondary | ICD-10-CM

## 2012-10-02 HISTORY — PX: OTHER SURGICAL HISTORY: SHX169

## 2012-10-02 LAB — LIPID PANEL
Cholesterol: 92 mg/dL (ref 0–200)
Triglycerides: 106 mg/dL (ref <150)
VLDL: 21 mg/dL (ref 0–40)

## 2012-10-02 LAB — CBC
MCH: 30 pg (ref 26.0–34.0)
MCHC: 34.6 g/dL (ref 30.0–36.0)
Platelets: ADEQUATE 10*3/uL (ref 150–400)

## 2012-10-02 LAB — COMPREHENSIVE METABOLIC PANEL
Albumin: 2.8 g/dL — ABNORMAL LOW (ref 3.5–5.2)
BUN: 27 mg/dL — ABNORMAL HIGH (ref 6–23)
Creatinine, Ser: 2.11 mg/dL — ABNORMAL HIGH (ref 0.50–1.35)
Total Bilirubin: 2 mg/dL — ABNORMAL HIGH (ref 0.3–1.2)
Total Protein: 6.3 g/dL (ref 6.0–8.3)

## 2012-10-02 LAB — HEMOGLOBIN A1C: Hgb A1c MFr Bld: 5.6 % (ref <5.7)

## 2012-10-02 MED ORDER — DOCUSATE SODIUM 100 MG PO CAPS
100.0000 mg | ORAL_CAPSULE | Freq: Every day | ORAL | Status: DC
  Administered 2012-10-02 – 2012-10-08 (×5): 100 mg via ORAL
  Filled 2012-10-02 (×7): qty 1

## 2012-10-02 MED ORDER — CLOPIDOGREL BISULFATE 75 MG PO TABS
75.0000 mg | ORAL_TABLET | Freq: Every day | ORAL | Status: DC
  Administered 2012-10-02: 75 mg via ORAL
  Filled 2012-10-02 (×3): qty 1

## 2012-10-02 MED ORDER — ONDANSETRON HCL 4 MG/2ML IJ SOLN
4.0000 mg | Freq: Three times a day (TID) | INTRAMUSCULAR | Status: DC | PRN
  Administered 2012-10-02: 4 mg via INTRAVENOUS
  Filled 2012-10-02: qty 2

## 2012-10-02 MED ORDER — PREDNISONE 5 MG PO TABS
5.0000 mg | ORAL_TABLET | Freq: Every day | ORAL | Status: DC
  Filled 2012-10-02 (×2): qty 1

## 2012-10-02 MED ORDER — ENOXAPARIN SODIUM 40 MG/0.4ML ~~LOC~~ SOLN
40.0000 mg | SUBCUTANEOUS | Status: DC
  Administered 2012-10-02 – 2012-10-07 (×6): 40 mg via SUBCUTANEOUS
  Filled 2012-10-02 (×6): qty 0.4

## 2012-10-02 NOTE — Evaluation (Signed)
Occupational Therapy Evaluation Patient Details Name: Richard Chavez MRN: [id number] DOB: [birth year] Today's Date: 10/02/2012 Time: 4098-1191 OT Time Calculation (min): 36 min  OT Assessment / Plan / Recommendation Clinical Impression  Pt with history of GERD, COPD, CAD, advanced heart failure, recent admission for PNA who was readmitted on 10/01/12 with SOB and BLE edema. He was noted to have LUE weakness with numbness and family reported confusion as well as difficulty walking. CVA suspected.  Pt presents with no functional movement and impaired awareness of L UE, +2 dependence in OOB activity, cognitive impairment, and dependence in ADL.  Will benefit from post acute rehab to increase independence and safety for potential return home with his son.  Will follow acutely.     OT Assessment  Patient needs continued OT Services    Follow Up Recommendations  CIR    Barriers to Discharge      Equipment Recommendations       Recommendations for Other Services Rehab consult  Frequency  Min 3X/week    Precautions / Restrictions Precautions Precautions: Fall Precaution Comments: L shoulder subluxation Restrictions Weight Bearing Restrictions: No   Pertinent Vitals/Pain Denies pain, on 2L 02 throughout session, vitals monitored    ADL  Grooming: Brushing hair;+1 Total assistance Where Assessed - Grooming: Supported sitting Upper Body Bathing: Maximal assistance Where Assessed - Upper Body Bathing: Unsupported sitting Lower Body Bathing: +1 Total assistance Where Assessed - Lower Body Bathing: Unsupported sitting;Supported sit to stand Upper Body Dressing: Maximal assistance Where Assessed - Upper Body Dressing: Supported sitting Lower Body Dressing: +1 Total assistance Where Assessed - Lower Body Dressing: Unsupported sitting;Supported sit to stand Toilet Transfer: +2 Total assistance Toilet Transfer: Patient Percentage: 70% Toilet Transfer Method: Stand pivot Education administrator: Materials engineer and Hygiene: +1 Total assistance Where Assessed - Engineer, mining and Hygiene: Sit to stand from 3-in-1 or toilet Equipment Used: Gait belt Transfers/Ambulation Related to ADLs: non ambulatory this visit ADL Comments: Began instruction in hemi techniques for dressing and care of L UE to prevent injury.    OT Diagnosis: Generalized weakness;Hemiplegia dominant side;Cognitive deficits  OT Problem List: Decreased strength;Decreased activity tolerance;Impaired balance (sitting and/or standing);Decreased cognition;Decreased safety awareness;Decreased knowledge of use of DME or AE;Decreased knowledge of precautions;Impaired tone;Impaired sensation;Obesity;Impaired UE functional use OT Treatment Interventions: Self-care/ADL training;Neuromuscular education;DME and/or AE instruction;Therapeutic activities;Cognitive remediation/compensation;Patient/family education;Balance training   OT Goals Acute Rehab OT Goals OT Goal Formulation: With patient Time For Goal Achievement: 10/16/12 Potential to Achieve Goals: Good ADL Goals Pt Will Perform Eating: with set-up;Sitting, chair ADL Goal: Eating - Progress: Goal set today Pt Will Perform Grooming: with set-up;Sitting, edge of bed ADL Goal: Grooming - Progress: Goal set today Pt Will Perform Upper Body Bathing: with min assist;Sitting, edge of bed;with cueing (comment type and amount) (for hemi techniques) ADL Goal: Upper Body Bathing - Progress: Goal set today Pt Will Perform Upper Body Dressing: with min assist;Sitting, bed;with cueing (comment type and amount) (for hemi techniques) ADL Goal: Upper Body Dressing - Progress: Goal set today Pt Will Transfer to Toilet: with min assist;3-in-1;Stand pivot transfer ADL Goal: Toilet Transfer - Progress: Goal set today Pt Will Perform Toileting - Clothing Manipulation: Standing;with mod assist ADL Goal: Toileting - Clothing  Manipulation - Progress: Goal set today Pt Will Perform Toileting - Hygiene: with min assist;Leaning right and/or left on 3-in-1/toilet ADL Goal: Toileting - Hygiene - Progress: Goal set today Miscellaneous OT Goals Miscellaneous OT Goal #1: Pt will perform  bed mobility to EOB with min guard assist in prep for ADL. OT Goal: Miscellaneous Goal #1 - Progress: Goal set today Miscellaneous OT Goal #2: Pt will position L UE during mobility, in bed, and in chair to protect from injury with min verbal cues. OT Goal: Miscellaneous Goal #2 - Progress: Goal set today  Visit Information  Last OT Received On: 10/02/12 Assistance Needed: +2 PT/OT Co-Evaluation/Treatment: Yes    Subjective Data  Subjective: "I don't want to mess up again like I did this morning." (in regard to vomiting) Patient Stated Goal: Return to active lifestyle.   Prior Functioning     Home Living Lives With: Son Available Help at Discharge: Family Type of Home: House Home Access: Stairs to enter Secretary/administrator of Steps: 1 Home Layout: One level Bathroom Shower/Tub: Health visitor: Standard Home Adaptive Equipment: Environmental consultant - rolling;Straight cane;Bedside commode/3-in-1;Grab bars in shower Prior Function Level of Independence: Independent Able to Take Stairs?: Yes Driving: Yes Communication Communication: Expressive difficulties Dominant Hand: Left         Vision/Perception Vision - History Baseline Vision: Wears glasses only for reading Patient Visual Report: No change from baseline   Cognition  Cognition Arousal/Alertness: Awake/alert Behavior During Therapy: WFL for tasks assessed/performed Overall Cognitive Status: Impaired/Different from baseline Area of Impairment: Safety/judgement;Awareness;Problem solving Safety/Judgement: Decreased awareness of deficits Awareness: Intellectual Problem Solving: Slow processing;Difficulty sequencing    Extremity/Trunk Assessment Right  Upper Extremity Assessment RUE ROM/Strength/Tone: Within functional levels RUE Coordination: WFL - gross/fine motor Left Upper Extremity Assessment LUE ROM/Strength/Tone: Deficits LUE ROM/Strength/Tone Deficits: mild activiation noted in proximal shoulder at deltoids.  However distal seems to be flaccid, Noticeable subluxation LUE Sensation: Deficits LUE Sensation Deficits: reports L UE "feels like someone else's" impaired proprioception LUE Coordination: Deficits LUE Coordination Deficits: no functional use Right Lower Extremity Assessment RLE ROM/Strength/Tone: Deficits RLE ROM/Strength/Tone Deficits: At least 3/5 gross with functional mobilty and standing Left Lower Extremity Assessment LLE ROM/Strength/Tone: Deficits LLE ROM/Strength/Tone Deficits: At least 3/5 gross with functional mobility. No knee buckling notied LLE Sensation: WFL - Light Touch LLE Coordination: Deficits LLE Coordination Deficits: Mild motor planning deficits during transfer noted Trunk Assessment Trunk Assessment: Normal     Mobility Bed Mobility Bed Mobility: Rolling Right;Right Sidelying to Sit Rolling Right: 3: Mod assist Right Sidelying to Sit: 3: Mod assist Transfers Sit to Stand: From chair/3-in-1;1: +2 Total assist;From bed Sit to Stand: Patient Percentage: 40% Stand to Sit: To chair/3-in-1;1: +2 Total assist Stand to Sit: Patient Percentage: 40% Details for Transfer Assistance: +2 (A) to initiate transfer and slowly descend to recliner with cues for overall hand and LE placement. Max cues for step sequence and advance hips during transfer from bed > 3n1 > recliner     Exercise     Balance Balance Balance Assessed: Yes Static Sitting Balance Static Sitting - Balance Support: Feet supported Static Sitting - Level of Assistance: 5: Stand by assistance Static Sitting - Comment/# of Minutes: 10   End of Session OT - End of Session Activity Tolerance: Patient limited by fatigue Patient left:  in chair;with call bell/phone within reach;with nursing in room Nurse Communication: Mobility status  GO     Evern Bio 10/02/2012, 5:16 PM [phone]

## 2012-10-02 NOTE — Progress Notes (Signed)
[facility]                                                                                Patient Demographics  Richard Chavez, is a 79 y.o. male, DOB - [birth year], UJW:[id number], FAO:[id number]  Admit date - 10/01/2012  Admitting Physician Rhetta Mura, MD  Outpatient Primary MD for the patient is Thayer Headings, MD  LOS - 1   Chief Complaint  Patient presents with  . Weakness        Assessment & Plan     1. Right MCA CVA, CT initially unremarkable, continues to have left arm weakness, discussed with neurologist for now continue present antiplatelet therapy of Plavix, repeat CT scan later, cannot get MRI do to pacemaker, lipid panel stable, echo carotid spending, check A1c. PT OT to see .   2. Shortness of breath-multifactorial-potentially aspiration pneumonia +/-CHF exacerbation-patient's dry weight is 233 pounds. Continue daily weights, strict in and out, Lasix IV 40 mg twice a day, hold for blood pressure below 100/50. Monitor BMP and weight closely. He is already feeling better    3. Potential aspiration pneumonia Agree with empiric Vancomycin/Zosyn for now-would change antibiotics based on two-view chest x-ray to be obtained 10/02/12. Will get speech therapist input to rule out overt oropharyngeal dysphagia. Oxygen and nebs to continue as needed.    4. History of CAD/cardiomyopathy/CHF NYHA class II-have requested [facility] cardiology to consult as he has advanced heart failure which is both systolic and diastolic as per last echocardiogram. Given history of #1, echo ordered for CVA. Continue Lasix and monitor BMP weight closely.   5. COPD history-continue Combivent inhaler 2 puff twice a day. Continue home dose prednisone 5 mg tablet. Continue tiotropium 18 mcg daily every morning. COPD appears to be at baseline. No wheezing.   6. Hypotension-seems like a chronic issue. Will hold metoprolol 25 mg twice a day for now.    7. Hyperlipidemia continue  Lipitor 20 mg daily with breakfast. Lipid panel stable.      Code Status: Full  Family Communication: none present  Disposition Plan: TBD   Procedures CT head, Echo, carotids   Consults Neuro, cardiology   DVT Prophylaxis  Lovenox    Lab Results  Component Value Date   PLT PLATELET CLUMPS NOTED ON SMEAR, COUNT APPEARS ADEQUATE 10/02/2012    Medications  Scheduled Meds: . atorvastatin  20 mg Oral Q breakfast  . clopidogrel  75 mg Oral Q breakfast  . docusate sodium  100 mg Oral Daily  . enoxaparin (LOVENOX) injection  40 mg Subcutaneous Q24H  . furosemide  40 mg Intravenous BID  . gabapentin  600 mg Oral TID  . guaiFENesin  1,200 mg Oral BID  . Ipratropium-Albuterol  2 puff Inhalation BID  . loratadine  10 mg Oral Q breakfast  . magnesium chloride  1 tablet Oral BID  . piperacillin-tazobactam (ZOSYN)  IV  3.375 g Intravenous Q8H  . potassium chloride  10 mEq Oral BID  . vancomycin  1,500 mg Intravenous Q24H  . [START ON 10/03/2012] Vitamin D (Ergocalciferol)  50,000 Units Oral Q7 days  . white petrolatum       Continuous Infusions:  PRN Meds:.  Antibiotics     Anti-infectives   Start     Dose/Rate Route Frequency Ordered Stop   10/02/12 1500  vancomycin (VANCOCIN) 1,500 mg in sodium chloride 0.9 % 500 mL IVPB     1,500 mg 250 mL/hr over 120 Minutes Intravenous Every 24 hours 10/01/12 1245     10/01/12 1330  vancomycin (VANCOCIN) 2,000 mg in sodium chloride 0.9 % 500 mL IVPB     2,000 mg 250 mL/hr over 120 Minutes Intravenous  Once 10/01/12 1244 10/01/12 1636   10/01/12 1300  piperacillin-tazobactam (ZOSYN) IVPB 3.375 g     3.375 g 12.5 mL/hr over 240 Minutes Intravenous 3 times per day 10/01/12 1245         Time Spent in minutes   35   SINGH,PRASHANT K M.D on 10/02/2012 at 10:56 AM  Between 7am to 7pm - Pager - [phone]  After 7pm go to [email or web address] - password [email or web address]  And look for the night coverage person covering for me after hours  [facility] Office  [phone]    Subjective:   Richard Chavez today has, No headache, No chest pain, No abdominal pain - No Nausea, No new weakness tingling or numbness, No Cough - SOB., L arm remains weak.  Objective:   Filed Vitals:   10/02/12 0700 10/02/12 0749 10/02/12 0800 10/02/12 0856  BP:  139/67 129/54   Pulse:  82 88   Temp:  98.5 F (36.9 C)    TempSrc:  Oral    Resp:  16 17   Height:      Weight: 109.6 kg (241 lb 10 oz)     SpO2:  97% 94% 96%    Wt Readings from Last 3 Encounters:  10/02/12 109.6 kg (241 lb 10 oz)  09/17/12 109.952 kg (242 lb 6.4 oz)  05/18/12 107.5 kg (236 lb 15.9 oz)     Intake/Output Summary (Last 24 hours) at 10/02/12 1056 Last data filed at 10/02/12 0800  Gross per 24 hour  Intake    220 ml  Output    950 ml  Net   -730 ml    Exam Awake Alert, Oriented X 3, No new F.N deficits, Normal affect, flaccid L arm 1/5 [place].AT,PERRAL Supple Neck,No JVD, No cervical lymphadenopathy appriciated.  Symmetrical Chest wall movement, Good air movement bilaterally, CTAB RRR,No Gallops,Rubs or new Murmurs, No Parasternal Heave +ve B.Sounds, Abd Soft, Non tender, No organomegaly appriciated, No rebound - guarding or rigidity. No Cyanosis, Clubbing or edema, No new Rash or bruise     Data Review   Micro Results Recent Results (from the past 240 hour(s))  CULTURE, BLOOD (ROUTINE X 2)     Status: None   Collection Time    10/01/12  1:49 PM      Result Value Range Status   Specimen Description BLOOD RIGHT FOREARM   Final   Special Requests BOTTLES DRAWN AEROBIC ONLY 6CC   Final   Culture  Setup Time 10/01/2012 17:12   Final   Culture     Final   Value:        BLOOD CULTURE RECEIVED NO GROWTH TO DATE CULTURE WILL BE HELD FOR 5 DAYS BEFORE ISSUING A FINAL NEGATIVE REPORT   Report Status PENDING   Incomplete  CULTURE, BLOOD (ROUTINE X 2)     Status: None   Collection Time    10/01/12  1:55 PM      Result Value Range Status   Specimen  Description BLOOD  THUMB RIGHT   Final   Special Requests BOTTLES DRAWN AEROBIC ONLY 5CC   Final   Culture  Setup Time 10/01/2012 17:12   Final   Culture     Final   Value:        BLOOD CULTURE RECEIVED NO GROWTH TO DATE CULTURE WILL BE HELD FOR 5 DAYS BEFORE ISSUING A FINAL NEGATIVE REPORT   Report Status PENDING   Incomplete  MRSA PCR SCREENING     Status: None   Collection Time    10/01/12  4:29 PM      Result Value Range Status   MRSA by PCR NEGATIVE  NEGATIVE Final   Comment:            The GeneXpert MRSA Assay (FDA     approved for NASAL specimens     only), is one component of a     comprehensive MRSA colonization     surveillance program. It is not     intended to diagnose MRSA     infection nor to guide or     monitor treatment for     MRSA infections.    Radiology Reports Dg Chest 2 View  10/01/2012   *RADIOLOGY REPORT*  Clinical Data: Shortness of breath.  CABG.  CHEST - 2 VIEW  Comparison: 09/15/2012  Findings: Motion and position degraded lateral view.  Aortic atherosclerosis. Pacer with leads at right atrium and right ventricle.  No lead discontinuity.  Cardiomegaly accentuated by AP portable technique.  No definite pleural fluid. No pneumothorax.  Similar to minimal improvement in interstitial and airspace disease involving the mid and inferior left lung.  Worsened right-sided aeration with lower lobe predominant interstitial and airspace disease. Somewhat to more confluent inferior right upper lobe opacity is favored to represent an area of scarring.  IMPRESSION: Worsened aeration, especially on the right.  Suspicious for alveolar pulmonary edema versus multifocal infection.  Degraded exam, especially the lateral view.   Original Report Authenticated By: Jeronimo Greaves, M.D.   Ct Head Wo Contrast  10/01/2012   *RADIOLOGY REPORT*  Clinical Data: Progressive generalized weakness, dizziness  CT HEAD WITHOUT CONTRAST  Technique:  Contiguous axial images were obtained from the base  of the skull through the vertex without contrast.  Comparison: Prior CT scan of the head 05/16/2012  Findings: No acute intracranial hemorrhage, acute infarction, mass lesion, mass effect, midline shift or hydrocephalus.  Gray-white differentiation is preserved throughout.  Stable mild generalized atrophy and periventricular hypoattenuation which is nonspecific but most consistent with the sequela of chronic microvascular ischemic change.  The globes and orbits are intact and symmetric bilaterally.  No acute soft tissue or calvarial abnormality. Normal aeration of the mastoid air cells and visualized paranasal sinuses.  Dense atherosclerotic vascular calcification in the bilateral cavernous carotid arteries.  IMPRESSION:  1.  No acute intracranial abnormality 2.  Stable mild atrophy and chronic white matter ischemic changes 3.  Intracranial atherosclerosis   Original Report Authenticated By: Malachy Moan, M.D.   Dg Chest Port 1 View  09/15/2012   *RADIOLOGY REPORT*  Clinical Data: 79 year old male weakness.  Pacemaker.  Chronic pulmonary disease.  PORTABLE CHEST - 1 VIEW  Comparison: 05/16/2012 and earlier.  Findings: Portable AP semi upright view at 1141 hours.  New widespread patchy and confluent left lung opacity, including left upper lobe involvement.  Interval decreased right upper lobe airspace disease and increased density along the right minor fissure. Stable cardiomegaly and mediastinal contours.  Visualized tracheal air column  is within normal limits.  Stable right chest cardiac pacemaker.  Sequelae of CABG.  IMPRESSION: 1.  New left lung airspace disease suspicious for multilobar pneumonia. 2.  Interval decreased right upper lobe airspace disease and thickening along the right minor fissure. 3.  Asymmetric pulmonary edema is a less likely consideration in this setting.   Original Report Authenticated By: Erskine Speed, M.D.    CBC  Recent Labs Lab 10/01/12 0846 10/02/12 0510  WBC 8.8 16.2*   HGB 14.4 13.3  HCT 43.6 38.4*  PLT 210 PLATELET CLUMPS NOTED ON SMEAR, COUNT APPEARS ADEQUATE  MCV 87.2 86.7  MCH 28.8 30.0  MCHC 33.0 34.6  RDW 16.4* 16.7*  LYMPHSABS 1.1  --   MONOABS 0.3  --   EOSABS 0.1  --   BASOSABS 0.0  --     Chemistries   Recent Labs Lab 10/01/12 0846 10/02/12 0820  NA 136 136  K 4.2 3.7  CL 99 100  CO2 22 21  GLUCOSE 88 78  BUN 20 27*  CREATININE 1.42* 2.11*  CALCIUM 8.8 9.0  AST 14 22  ALT 12 11  ALKPHOS 59 77  BILITOT 0.9 2.0*   ------------------------------------------------------------------------------------------------------------------ estimated creatinine clearance is 35 ml/min (by C-G formula based on Cr of 2.11). ------------------------------------------------------------------------------------------------------------------ No results found for this basename: HGBA1C,  in the last 72 hours ------------------------------------------------------------------------------------------------------------------  Recent Labs  10/02/12 0820  CHOL 92  HDL 45  LDLCALC 26  TRIG 106  CHOLHDL 2.0   ------------------------------------------------------------------------------------------------------------------ No results found for this basename: TSH, T4TOTAL, FREET3, T3FREE, THYROIDAB,  in the last 72 hours ------------------------------------------------------------------------------------------------------------------ No results found for this basename: VITAMINB12, FOLATE, FERRITIN, TIBC, IRON, RETICCTPCT,  in the last 72 hours  Coagulation profile No results found for this basename: INR, PROTIME,  in the last 168 hours  No results found for this basename: DDIMER,  in the last 72 hours  Cardiac Enzymes  Recent Labs Lab 10/01/12 0849  TROPONINI <0.30   ------------------------------------------------------------------------------------------------------------------ No components found with this basename: POCBNP,

## 2012-10-02 NOTE — Evaluation (Signed)
Physical Therapy Evaluation Patient Details Name: ARVAL BRANDSTETTER MRN: [id number] DOB: July 01, 1926 Today's Date: 10/02/2012 Time: 7829-5621 PT Time Calculation (min): 29 min  PT Assessment / Plan / Recommendation Clinical Impression  Richard Chavez is a 79 y.o. male with history of GERD, COPD, CAD, advanced systolic and diastolic heart failure, recent admission for PNA who was readmitted on 10/01/12 with SOB and BLE edema. He was noted to have LUE weakness with numbness and family reported confusion as well as difficulty walking.  Pt will benefit from acute PT services to improve overall mobility, gait and balance to prepare for safe d/c to next venue.  Highly recommend CIR at this time.    PT Assessment  Patient needs continued PT services    Follow Up Recommendations  CIR    Barriers to Discharge None      Equipment Recommendations   (TBD)    Frequency Min 4X/week    Precautions / Restrictions Precautions Precautions: Fall Restrictions Weight Bearing Restrictions: No   Pertinent Vitals/Pain No c/o pain      Mobility  Bed Mobility Bed Mobility: Not assessed;Supine to Sit;Sitting - Scoot to [place] of Bed Transfers Transfers: Sit to Stand;Stand to Sit Sit to Stand: From chair/3-in-1;1: +2 Total assist;From bed Sit to Stand: Patient Percentage: 40% Stand to Sit: To chair/3-in-1;1: +2 Total assist Stand to Sit: Patient Percentage: 40% Stand Pivot Transfers: 1: +2 Total assist;From elevated surface Stand Pivot Transfers: Patient Percentage: 60% Details for Transfer Assistance: +2 (A) to initiate transfer and slowly descend to recliner with cues for overall hand and LE placement. Max cues for step sequence and advance hips during transfer from bed > 3n1 > recliner Ambulation/Gait Ambulation/Gait Assistance: Not tested (comment) Wheelchair Mobility Wheelchair Mobility: No Modified Rankin (Stroke Patients Only) Pre-Morbid Rankin Score: No significant disability Modified Rankin:  Severe disability    Exercises     PT Diagnosis: Difficulty walking;Acute pain;Abnormality of gait;Generalized weakness  PT Problem List: Decreased strength;Decreased activity tolerance;Decreased balance;Decreased mobility;Decreased coordination;Decreased knowledge of use of DME PT Treatment Interventions: DME instruction;Gait training;Functional mobility training;Therapeutic activities;Therapeutic exercise;Balance training;Neuromuscular re-education;Patient/family education   PT Goals Acute Rehab PT Goals PT Goal Formulation: With patient Time For Goal Achievement: 10/14/12 Potential to Achieve Goals: Good Pt will go Supine/Side to Sit: with supervision PT Goal: Supine/Side to Sit - Progress: Goal set today Pt will go Sit to Supine/Side: with supervision PT Goal: Sit to Supine/Side - Progress: Goal set today Pt will go Sit to Stand: with min assist PT Goal: Sit to Stand - Progress: Goal set today Pt will go Stand to Sit: with min assist PT Goal: Stand to Sit - Progress: Goal set today Pt will Transfer Bed to Chair/Chair to Bed: with min assist PT Transfer Goal: Bed to Chair/Chair to Bed - Progress: Goal set today Pt will Stand: with supervision;1 - 2 min PT Goal: Stand - Progress: Goal set today Pt will Ambulate: 51 - 150 feet;with least restrictive assistive device;with min assist PT Goal: Ambulate - Progress: Goal set today  Visit Information  Last PT Received On: 10/02/12 Assistance Needed: +2 PT/OT Co-Evaluation/Treatment: Yes    Subjective Data  Subjective: "I need to use the bathroom." Patient Stated Goal: home   Prior Functioning  Home Living Lives With: Son Available Help at Discharge: Family Type of Home: House Home Access: Stairs to enter Secretary/administrator of Steps: 1 Home Layout: One level Bathroom Shower/Tub: Walk-in shower Home Adaptive Equipment: Environmental consultant - rolling;Straight cane;Bedside commode/3-in-1;Grab bars in shower Prior  Function Level of  Independence: Independent Able to Take Stairs?: Yes Communication Communication: Expressive difficulties Dominant Hand: Right    Cognition  Cognition Arousal/Alertness: Awake/alert Behavior During Therapy: WFL for tasks assessed/performed Overall Cognitive Status: Impaired/Different from baseline Area of Impairment: Safety/judgement;Awareness;Problem solving Safety/Judgement: Decreased awareness of deficits (needs cues for attention to left UE) Awareness: Intellectual Problem Solving: Slow processing;Difficulty sequencing    Extremity/Trunk Assessment Right Upper Extremity Assessment RUE ROM/Strength/Tone: Within functional levels Left Upper Extremity Assessment LUE ROM/Strength/Tone: Deficits LUE ROM/Strength/Tone Deficits: mild activiation noted in proximal shoulder at deltoids.  However distal seems to be flaccid, Noticeable subluxation LUE Sensation: WFL - Light Touch Right Lower Extremity Assessment RLE ROM/Strength/Tone: Deficits RLE ROM/Strength/Tone Deficits: At least 3/5 gross with functional mobilty and standing Left Lower Extremity Assessment LLE ROM/Strength/Tone: Deficits LLE ROM/Strength/Tone Deficits: At least 3/5 gross with functional mobility. No knee buckling notied LLE Sensation: WFL - Light Touch LLE Coordination: Deficits LLE Coordination Deficits: Mild motor planning deficits during transfer noted   Balance Balance Balance Assessed: Yes Static Sitting Balance Static Sitting - Balance Support: Feet supported Static Sitting - Level of Assistance: 5: Stand by assistance  End of Session PT - End of Session Equipment Utilized During Treatment: Gait belt;Oxygen (2L) Activity Tolerance: Patient tolerated treatment well Patient left: in chair;with call bell/phone within reach Nurse Communication: Mobility status  GP     Arnelle Nale 10/02/2012, 3:43 PM Jake Shark, PT DPT [phone]

## 2012-10-02 NOTE — Evaluation (Signed)
Clinical/Bedside Swallow Evaluation Patient Details  Name: Richard Chavez MRN: [id number] Date of Birth: 1926/06/10  Today's Date: 10/02/2012 Time: 1103-1120 SLP Time Calculation (min): 17 min  Past Medical History:  Past Medical History  Diagnosis Date  . CAD (coronary artery disease), native coronary artery     Status post CABG  . Hypertension   . Hyperlipidemia   . GERD (gastroesophageal reflux disease)   . COPD (chronic obstructive pulmonary disease)   . CRF (chronic renal failure)   . Barrett's esophagus   . OP (osteoporosis)   . CAD (coronary artery disease) of artery bypass graft 07/04/2011    LIMA-LAD; SVG-OM2-OM3, SVG-RCA -- all patent as of 05/27/11  . Syncope 07/04/2011  . Pacemaker 07/04/2011  . Cardiomyopathy, ischemic 07/07/2011    2D Echo - EF 35-40, moderately dilated left atrium  . Myocardial infarction     25 yrs ago  . Peripheral vascular disease   . Arthritis   . BPH (benign prostatic hyperplasia)   . Polyp of colon     removed  . Sleep apnea     STOP BANG SCORE 4  . AV block, 3rd degree     Post St. Jude pacemaker, EF 35-40, does have intermittent atrial tachycardia, either PAt or Afib  . BPH (benign prostatic hypertrophy) 11/15/2011  . SOB (shortness of breath) on exertion 02/14/2010    2D Echo - EF 35-45,    Past Surgical History:  Past Surgical History  Procedure Laterality Date  . Pacemaker placement  2007  . Cholecystectomy  2005  . Coronary artery bypass graft  1978  . Lesion excision      from penis  . Cardiac catheterization  06/06/2011    occluded proximal RCA, ostial LAD and mid Circumflex after OM 1.; [place] LIMA-LAD, patent SVG-OM1-OM 2, patent SVG-RCA. Reduced ejection fraction of 30-35%.  . Cardiac catheterization  06/14/2010    LIMA to LAD, SVG to RCA, SVG to OM1 Circumflex, 100% occluded RCA, 100% occluded circumfkex, 100% occluded LAD, no evidence of graft dysfunction, continue medical therapy  . Cardiac catheterization  04/12/2003    3  vessel coronary artery disease, continue medical treatment  . Cardiac catheterization  10/07/2000    Severe 3 vessel coronary artery disease, continue medical treatment   HPI:  79 y.o. male inclusive of CAD, GERD, HTN, COPD, Barrett's esophagus, MI, PVD sleep apneaNICM echo Ef 35-40% 3/13 s/p PPM placement and recent hospitalization from 09/15/12-09/17/12 for community-acquired pneumonia we presented [facility] emergency room 09/23/12 with weakness and shortness of breath. Patient's son states that patient seemed to be struggling behind yesterday and because of this as well as some numbness in his left hand he went to his primary care physician. Patient does not report any chest pain or nausea and has to be careful when he eats as if he eats too fast he gets choked up.   conern for CVA.  CT negative, unable to have MRI due to pacemaker.  RN report pt.'s     side is flaccid.   Assessment / Plan / Recommendation Clinical Impression  Pt. seen for swallow assessment noted to have mild dysarthria without overt oral weakness.  Solids not administerd without dentures present and pt. reporting he does not typically eat without them.  No overt s/s aspiration with thin (cup,straw) or puree texture.  He is at higher risk due to suspected CVA, recent pna and son's report of difficulty if he "eats/drinks too fast" (per documentation).  Recommend  Dys 2 texture and thin liquids, pills whole with water.  SLP will continue to follow for diet texure upgrade if dentures are brought to hospital.      Aspiration Risk  Moderate    Diet Recommendation Dysphagia 2 (Fine chop);Thin liquid   Liquid Administration via: Cup;Straw Medication Administration: Whole meds with liquid Supervision: Patient able to self feed;Intermittent supervision to cue for compensatory strategies Compensations: Slow rate;Small sips/bites Postural Changes and/or Swallow Maneuvers: Seated upright 90 degrees;Upright 30-60 min after meal    Other   Recommendations Oral Care Recommendations: Oral care BID   Follow Up Recommendations  Inpatient Rehab    Frequency and Duration min 2x/week  2 weeks   Pertinent Vitals/Pain none    SLP Swallow Goals Patient will utilize recommended strategies during swallow to increase swallowing safety with: Minimal cueing   Swallow Study Prior Functional Status       General HPI: 79 y.o. male inclusive of CAD, GERD, HTN, COPD, Barrett's esophagus, MI, PVD sleep apneaNICM echo Ef 35-40% 3/13 s/p PPM placement and recent hospitalization from 09/15/12-09/17/12 for community-acquired pneumonia we presented to [facility] emergency room 09/23/12 with weakness and shortness of breath. Patient's son states that patient seemed to be struggling behind yesterday and because of this as well as some numbness in his left hand he went to his primary care physician. Patient does not report any chest pain or nausea and has to be careful when he eats as if he eats too fast he gets choked up.   conern for CVA.  CT negative, unable to have MRI due to pacemaker.  RN report pt.'s     side is flaccid. Type of Study: Bedside swallow evaluation Previous Swallow Assessment: none Diet Prior to this Study: NPO Temperature Spikes Noted: No Respiratory Status: Supplemental O2 delivered via (comment) History of Recent Intubation: No Behavior/Cognition: Alert;Cooperative;Pleasant mood Oral Cavity - Dentition: Edentulous (dentures not present) Self-Feeding Abilities: Able to feed self Patient Positioning: Upright in bed Baseline Vocal Quality: Hoarse Volitional Cough: Strong Volitional Swallow: Able to elicit    Oral/Motor/Sensory Function Overall Oral Motor/Sensory Function: Appears within functional limits for tasks assessed   Ice Chips Ice chips: Not tested   Thin Liquid Thin Liquid: Within functional limits Presentation: Cup;Straw    Nectar Thick Nectar Thick Liquid: Not tested   Honey Thick Honey Thick Liquid: Not tested    Puree Puree: Within functional limits   Solid   GO    Solid: Not tested (normally eats with dentures)      Darrow Bussing.Ed CCC-SLP Pager [phone]  10/02/2012  Royce Macadamia 10/02/2012,12:06 PM

## 2012-10-02 NOTE — Progress Notes (Signed)
[facility]  Subjective: Still feels short of breath at times, but states that it is much improved. Using [employer].  Objective: Vital signs in last 24 hours: Temp:  [97.9 F (36.6 C)-99.8 F (37.7 C)] 98 F (36.7 C) (05/29 0400) Pulse Rate:  [61-100] 83 (05/29 0600) Resp:  [16-30] 20 (05/29 0600) BP: (89-147)/(30-59) 128/46 mmHg (05/29 0600) SpO2:  [91 %-99 %] 95 % (05/29 0600) Weight:  [238 lb 15.7 oz (108.4 kg)-241 lb 10 oz (109.6 kg)] 241 lb 10 oz (109.6 kg) (05/29 0700) Last BM Date: 10/01/12  Intake/Output from previous day: 05/28 0701 - 05/29 0700 In: 120 [P.O.:120] Out: 875 [Urine:875] Intake/Output this shift:    Medications Current Facility-Administered Medications  Medication Dose Route Frequency Provider Last Rate Last Dose  . aspirin EC tablet 325 mg  325 mg Oral Daily Ritta Slot, MD       Or  . aspirin suppository 300 mg  300 mg Rectal Daily Ritta Slot, MD   300 mg at 10/01/12 2009  . atorvastatin (LIPITOR) tablet 20 mg  20 mg Oral Q breakfast Rhetta Mura, MD      . furosemide (LASIX) injection 40 mg  40 mg Intravenous BID Rhetta Mura, MD   40 mg at 10/01/12 2009  . gabapentin (NEURONTIN) capsule 600 mg  600 mg Oral TID Rhetta Mura, MD   600 mg at 10/01/12 2353  . guaiFENesin (MUCINEX) 12 hr tablet 1,200 mg  1,200 mg Oral BID Rhetta Mura, MD   1,200 mg at 10/01/12 2253  . Ipratropium-Albuterol (COMBIVENT) respimat 2 puff  2 puff Inhalation BID Rhetta Mura, MD      . loratadine (CLARITIN) tablet 10 mg  10 mg Oral Q breakfast Rhetta Mura, MD      . magnesium chloride (SLOW-MAG) 64 MG SR tablet 64 mg  1 tablet Oral BID Rhetta Mura, MD   64 mg at 10/01/12 2253  . piperacillin-tazobactam (ZOSYN) IVPB 3.375 g  3.375 g Intravenous Q8H Ann Held, RPH   3.375 g at 10/02/12 0601  . potassium chloride (K-DUR) CR tablet 10 mEq  10 mEq Oral BID Rhetta Mura, MD    10 mEq at 10/01/12 2253  . vancomycin (VANCOCIN) 1,500 mg in sodium chloride 0.9 % 500 mL IVPB  1,500 mg Intravenous Q24H Ann Held, [facility]      . [START ON 10/03/2012] Vitamin D (Ergocalciferol) (DRISDOL) capsule 50,000 Units  50,000 Units Oral Q7 days Rhetta Mura, MD      . white petrolatum (VASELINE) gel             PE: General appearance: alert, cooperative, no distress and moderately obese Neck: No JVD Lungs: clear to auscultation bilaterally Heart: regular rate and rhythm Extremities: trace bilateral LE edema. Left upper extremity hemiparesis Pulses: 2+ and symmetric Skin: warm and dry Neurologic: Grossly normal  Lab Results:   Recent Labs  10/01/12 0846 10/02/12 0510  WBC 8.8 16.2*  HGB 14.4 13.3  HCT 43.6 38.4*  PLT 210 PLATELET CLUMPS NOTED ON SMEAR, COUNT APPEARS ADEQUATE   BMET  Recent Labs  10/01/12 0846  NA 136  K 4.2  CL 99  CO2 22  GLUCOSE 88  BUN 20  CREATININE 1.42*  CALCIUM 8.8    Studies/Results: 2D echo pending   Assessment/Plan  Principal Problem:   Stroke, lacunar Active Problems:   COPD (chronic obstructive pulmonary disease)   CAD CABG 1998, patent grafts 06/06/11 after abn Myoview  Pacemaker, St Jude, implanted 2008   Cardiomyopathy, ischemic, EF 30-35% by recent cath   Chronic combined systolic and diastolic congestive heart failure   Chronic kidney disease, stage III (moderate)   Acute on chronic combined systolic and diastolic congestive heart failure, NYHA class 3   Possible reccuring Aspiration pneumonia, worsened by potential CVA  Plan: Pt reports improvement in breathing. Lungs sound fairly clear. Lower extremity edema has improved significantly. Labs are pending. If renal function stable, give scheduled am dose of IV Lasix, 20 mg. Then consider rechecking BNP later today. BP is stable, with most recent BP of 129/54. Will need to supplement electrolytes if needed. Pt continues to have left upper extremity  hemiparesis. Neurology following. Cardiology will continue to follow A/C CHF.     LOS: 1 day    Brittainy M. Delmer Islam 10/02/2012 7:49 AM  I have seen and evaluated the patient this AM along with Boyce Medici, PA. I agree with her findings, examination as well as impression recommendations.  Overall improved breathing.  Unfortunately, we do not have good I/Os.   BP is holding steady -- continue with IV Lasix today & convert to po 40mg  bid tomorrow.  With desire to allow for stable BPs, we are holding BB & not planning on ACE-I/ARB during this admission.  Continue PNA Rx per [facility] & CVA Rx per Neurology.   Clearly with L arm paralysis, he has had a CVA, just not sure of extent.  Switched from ASA to Plavix Will review echo being done now (more for f/u of cardiac function than as a cardio-embolic source).  MD Time with pt: 5 min  HARDING,[name] W, M.D., M.S. [facility] [address] [place], [place]  [address]  [phone] Pager # [phone] 10/02/2012 9:27 AM

## 2012-10-02 NOTE — Consult Note (Signed)
Physical Medicine and Rehabilitation Consult Reason for Consult: LUE weakness, fluid overload Referring Physician:  Dr. Thedore Mins   HPI: Richard Chavez is a 79 y.o. male with history of GERD, COPD, CAD, advanced systolic and diastolic heart failure, recent admission for PNA who was readmitted on 10/01/12 with SOB and BLE edema. He was noted to have LUE weakness with numbness and family reported confusion as well as difficulty walking. He was treated with IV diuretics for acute on chronic CHF with improvement in respiratory status and LE edema. CT head done per neurology input due to concerns of subcortical stroke. CT head without acute changes and stable mild atrophy with white matter ischemic changes. He was changed to plavix per neurology recommendations and PT,OT, evaluations pending.    Review of Systems  HENT: Positive for hearing loss.   Eyes: Negative for blurred vision and double vision.  Respiratory: Positive for shortness of breath. Negative for cough and wheezing.   Cardiovascular: Negative for chest pain and palpitations.  Gastrointestinal: Positive for constipation (reports no BM since "Sunday.). Negative for nausea and vomiting.  Genitourinary: Positive for frequency.  Musculoskeletal: Negative for back pain and joint pain.  Neurological: Positive for sensory change, focal weakness and weakness. Negative for headaches.  Psychiatric/Behavioral: The patient is not nervous/anxious and does not have insomnia.    Past Medical History  Diagnosis Date  . CAD (coronary artery disease), native coronary artery     Status post CABG  . Hypertension   . Hyperlipidemia   . GERD (gastroesophageal reflux disease)   . COPD (chronic obstructive pulmonary disease)   . CRF (chronic renal failure)   . Barrett's esophagus   . OP (osteoporosis)   . CAD (coronary artery disease) of artery bypass graft 07/04/2011    LIMA-LAD; SVG-OM2-OM3, SVG-RCA -- all patent as of 05/27/11  . Syncope 07/04/2011  .  Pacemaker 07/04/2011  . Cardiomyopathy, ischemic 07/07/2011    2D Echo - EF 35-40, moderately dilated left atrium  . Myocardial infarction     25"  yrs ago  . Peripheral vascular disease   . Arthritis   . BPH (benign prostatic hyperplasia)   . Polyp of colon     removed  . Sleep apnea     STOP BANG SCORE 4  . AV block, 3rd degree     Post [employer] pacemaker, EF 35-40, does have intermittent atrial tachycardia, either PAt or Afib  . BPH (benign prostatic hypertrophy) 11/15/2011  . SOB (shortness of breath) on exertion 02/14/2010    2D Echo - EF 35-45,    Past Surgical History  Procedure Laterality Date  . Pacemaker placement  2007  . Cholecystectomy  2005  . Coronary artery bypass graft  1978  . Lesion excision      from penis  . Cardiac catheterization  06/06/2011    occluded proximal RCA, ostial LAD and mid Circumflex after OM 1.; [place] LIMA-LAD, patent SVG-OM1-OM 2, patent SVG-RCA. Reduced ejection fraction of 30-35%.  . Cardiac catheterization  06/14/2010    LIMA to LAD, SVG to RCA, SVG to OM1 Circumflex, 100% occluded RCA, 100% occluded circumfkex, 100% occluded LAD, no evidence of graft dysfunction, continue medical therapy  . Cardiac catheterization  04/12/2003    3 vessel coronary artery disease, continue medical treatment  . Cardiac catheterization  10/07/2000    Severe 3 vessel coronary artery disease, continue medical treatment   Family History  Problem Relation Age of Onset  . Leukemia Father   . Heart  disease Father   . Alzheimer's disease Mother   . Arthritis Mother   . Lung cancer Son 7  . Alzheimer's disease Maternal Grandmother    Social History: Lives with son. Son provides supervision/ assistance as needed. He retired from [employer] cigarettes" reports that he quit smoking about 27 years ago. His smoking use included Cigarettes. He has a 4.5 pack-year smoking history. He has never used smokeless tobacco. He reports that he does not drink alcohol or use  illicit drugs.   Allergies: No Known Allergies   Medications Prior to Admission  Medication Sig Dispense Refill  . albuterol-ipratropium (COMBIVENT) 18-103 MCG/ACT inhaler Inhale 2 puffs into the lungs 2 (two) times daily.       Marland Kitchen atorvastatin (LIPITOR) 20 MG tablet Take 20 mg by mouth daily with breakfast.       . fluticasone (FLONASE) 50 MCG/ACT nasal spray Place 2 sprays into the nose 2 (two) times daily as needed. For dryness.      . Fluticasone-Salmeterol (ADVAIR DISKUS) 500-50 MCG/DOSE AEPB Inhale 1 puff into the lungs every 12 (twelve) hours.       . furosemide (LASIX) 40 MG tablet Take 1 tablet (40 mg total) by mouth daily with breakfast.  30 tablet    . gabapentin (NEURONTIN) 300 MG capsule Take 600 mg by mouth 3 (three) times daily.       Marland Kitchen guaiFENesin (MUCINEX) 600 MG 12 hr tablet Take 1,200 mg by mouth 2 (two) times daily.      Marland Kitchen loratadine (CLARITIN) 10 MG tablet Take 10 mg by mouth daily with breakfast.       . Magnesium Chloride (SLOW-MAG) 535 (64 MG) MG TBCR Take 1 tablet by mouth 2 (two) times daily.       . metoprolol tartrate (LOPRESSOR) 25 MG tablet Take 25 mg by mouth 2 (two) times daily.      . Omega-3 Fatty Acids (FISH OIL) 1000 MG CAPS Take 1 capsule by mouth daily.       . potassium chloride (K-DUR) 10 MEQ tablet Take 10 mEq by mouth 2 (two) times daily.       . predniSONE (DELTASONE) 5 MG tablet Take 5 mg by mouth daily.      . ranitidine (ZANTAC) 150 MG capsule Take 150 mg by mouth 2 (two) times daily.       Marland Kitchen tiotropium (SPIRIVA) 18 MCG inhalation capsule Place 18 mcg into inhaler and inhale daily with breakfast.       . Vitamin D, Ergocalciferol, (DRISDOL) 50000 UNITS CAPS Take 50,000 Units by mouth every 7 (seven) days. Take on Fridays      . vitamin E 1000 UNIT capsule Take 1,000 Units by mouth daily with breakfast.       . levofloxacin (LEVAQUIN) 750 MG tablet Take 1 tablet (750 mg total) by mouth every other day. Start on 09/18/2012.  2 tablet  0    Home:     Functional History:   Functional Status:  Mobility:          ADL:    Cognition: Cognition Orientation Level: Oriented to person;Oriented to place;Oriented to situation    Blood pressure 129/54, pulse 88, temperature 98.5 F (36.9 C), temperature source Oral, resp. rate 17, height 6\' 2"  (1.88 m), weight 109.6 kg (241 lb 10 oz), SpO2 96.00%.  Physical Exam  Nursing note and vitals reviewed. Constitutional: He is oriented to person, place, and time. He appears well-developed and well-nourished.  HENT:  Head: Normocephalic  and atraumatic.  Eyes: Pupils are equal, round, and reactive to light.  Neck: Normal range of motion. Neck supple.  Cardiovascular: Normal rate and regular rhythm.   Pulmonary/Chest: Effort normal. No respiratory distress. He has no wheezes.  Abdominal: He exhibits distension.  Round, decreased BS.   Musculoskeletal: He exhibits no edema.  Neurological: He is alert and oriented to person, place, and time.  Mild dysarthria. Follows commands without difficulty. LUE 0/5 but does sense pain. LLE grossly 3-4/5 but inconsistent. Could not get him to consistently ADF or APF. Decreased sensation bilateral feet. Right side grossly 4 to 5/5.   Skin: Skin is warm and dry. Rash (Right shin/foot.) noted.  Psychiatric:  Restless, distractible    Results for orders placed during the hospital encounter of 10/01/12 (from the past 24 hour(s))  URINALYSIS, ROUTINE W REFLEX MICROSCOPIC     Status: None   Collection Time    10/01/12  9:25 AM      Result Value Range   Color, Urine YELLOW  YELLOW   APPearance CLEAR  CLEAR   Specific Gravity, Urine 1.015  1.005 - 1.030   pH 7.0  5.0 - 8.0   Glucose, UA NEGATIVE  NEGATIVE mg/dL   Hgb urine dipstick NEGATIVE  NEGATIVE   Bilirubin Urine NEGATIVE  NEGATIVE   Ketones, ur NEGATIVE  NEGATIVE mg/dL   Protein, ur NEGATIVE  NEGATIVE mg/dL   Urobilinogen, UA 1.0  0.0 - 1.0 mg/dL   Nitrite NEGATIVE  NEGATIVE   Leukocytes, UA  NEGATIVE  NEGATIVE  POCT I-STAT 3, BLOOD GAS (G3+)     Status: Abnormal   Collection Time    10/01/12 12:22 PM      Result Value Range   pH, Arterial 7.386  7.350 - 7.450   pCO2 arterial 38.8  35.0 - 45.0 mmHg   pO2, Arterial 72.0 (*) 80.0 - 100.0 mmHg   Bicarbonate 23.3  20.0 - 24.0 mEq/L   TCO2 24  0 - 100 mmol/L   O2 Saturation 94.0     Acid-base deficit 2.0  0.0 - 2.0 mmol/L   Patient temperature 98.6 F     Collection site RADIAL, ALLEN'S TEST ACCEPTABLE     Drawn by Operator     Sample type ARTERIAL    CULTURE, BLOOD (ROUTINE X 2)     Status: None   Collection Time    10/01/12  1:49 PM      Result Value Range   Specimen Description BLOOD RIGHT FOREARM     Special Requests BOTTLES DRAWN AEROBIC ONLY 6CC     Culture  Setup Time 10/01/2012 17:12     Culture       Value:        BLOOD CULTURE RECEIVED NO GROWTH TO DATE CULTURE WILL BE HELD FOR 5 DAYS BEFORE ISSUING A FINAL NEGATIVE REPORT   Report Status PENDING    CULTURE, BLOOD (ROUTINE X 2)     Status: None   Collection Time    10/01/12  1:55 PM      Result Value Range   Specimen Description BLOOD THUMB RIGHT     Special Requests BOTTLES DRAWN AEROBIC ONLY 5CC     Culture  Setup Time 10/01/2012 17:12     Culture       Value:        BLOOD CULTURE RECEIVED NO GROWTH TO DATE CULTURE WILL BE HELD FOR 5 DAYS BEFORE ISSUING A FINAL NEGATIVE REPORT   Report Status PENDING  MRSA PCR SCREENING     Status: None   Collection Time    10/01/12  4:29 PM      Result Value Range   MRSA by PCR NEGATIVE  NEGATIVE  CBC     Status: Abnormal   Collection Time    10/02/12  5:10 AM      Result Value Range   WBC 16.2 (*) 4.0 - 10.5 K/uL   RBC 4.43  4.22 - 5.81 MIL/uL   Hemoglobin 13.3  13.0 - 17.0 g/dL   HCT 16.1 (*) 09.6 - 04.5 %   MCV 86.7  78.0 - 100.0 fL   MCH 30.0  26.0 - 34.0 pg   MCHC 34.6  30.0 - 36.0 g/dL   RDW 40.9 (*) 81.1 - 91.4 %   Platelets    150 - 400 K/uL   Value: PLATELET CLUMPS NOTED ON SMEAR, COUNT APPEARS  ADEQUATE   Dg Chest 2 View  10/01/2012   *RADIOLOGY REPORT*  Clinical Data: Shortness of breath.  CABG.  CHEST - 2 VIEW  Comparison: 09/15/2012  Findings: Motion and position degraded lateral view.  Aortic atherosclerosis. Pacer with leads at right atrium and right ventricle.  No lead discontinuity.  Cardiomegaly accentuated by AP portable technique.  No definite pleural fluid. No pneumothorax.  Similar to minimal improvement in interstitial and airspace disease involving the mid and inferior left lung.  Worsened right-sided aeration with lower lobe predominant interstitial and airspace disease. Somewhat to more confluent inferior right upper lobe opacity is favored to represent an area of scarring.  IMPRESSION: Worsened aeration, especially on the right.  Suspicious for alveolar pulmonary edema versus multifocal infection.  Degraded exam, especially the lateral view.   Original Report Authenticated By: Jeronimo Greaves, M.D.   Ct Head Wo Contrast  10/01/2012   *RADIOLOGY REPORT*  Clinical Data: Progressive generalized weakness, dizziness  CT HEAD WITHOUT CONTRAST  Technique:  Contiguous axial images were obtained from the base of the skull through the vertex without contrast.  Comparison: Prior CT scan of the head 05/16/2012  Findings: No acute intracranial hemorrhage, acute infarction, mass lesion, mass effect, midline shift or hydrocephalus.  Gray-white differentiation is preserved throughout.  Stable mild generalized atrophy and periventricular hypoattenuation which is nonspecific but most consistent with the sequela of chronic microvascular ischemic change.  The globes and orbits are intact and symmetric bilaterally.  No acute soft tissue or calvarial abnormality. Normal aeration of the mastoid air cells and visualized paranasal sinuses.  Dense atherosclerotic vascular calcification in the bilateral cavernous carotid arteries.  IMPRESSION:  1.  No acute intracranial abnormality 2.  Stable mild atrophy and  chronic white matter ischemic changes 3.  Intracranial atherosclerosis   Original Report Authenticated By: Malachy Moan, M.D.    Assessment/Plan: Diagnosis: ?Right subcortical infarct with LUE >LLE weakness, deconditioning related to multiple medical issues 1. Does the need for close, 24 hr/day medical supervision in concert with the patient's rehab needs make it unreasonable for this patient to be served in a less intensive setting? Yes and Potentially 2. Co-Morbidities requiring supervision/potential complications: copd, cad, ckd 3. Due to bladder management, bowel management, safety, skin/wound care, disease management, medication administration, pain management and patient education, does the patient require 24 hr/day rehab nursing? Yes 4. Does the patient require coordinated care of a physician, rehab nurse, PT (1-2 hrs/day, 5 days/week) and OT (1-2 hrs/day, 5 days/week) to address physical and functional deficits in the context of the above medical diagnosis(es)? Yes Addressing deficits  in the following areas: balance, endurance, locomotion, strength, transferring, bowel/bladder control, bathing, dressing, feeding, grooming, toileting and psychosocial support 5. Can the patient actively participate in an intensive therapy program of at least 3 hrs of therapy per day at least 5 days per week? Yes and Potentially 6. The potential for patient to make measurable gains while on inpatient rehab is good 7. Anticipated functional outcomes upon discharge from inpatient rehab are ?mod I to min assist with PT, mod I to min assist with OT, n/a with SLP. 8. Estimated rehab length of stay to reach the above functional goals is: ?1-2 weeks 9. Does the patient have adequate social supports to accommodate these discharge functional goals? Yes 10. Anticipated D/C setting: Home 11. Anticipated post D/C treatments: HH therapy 12. Overall Rehab/Functional Prognosis: excellent  RECOMMENDATIONS: This  patient's condition is appropriate for continued rehabilitative care in the following setting: probable CIR Patient has agreed to participate in recommended program. Yes, potentially. Note that insurance prior authorization may be required for reimbursement for recommended care.  Comment: Follow for medical and functional progress. Rehab RN to follow up.   Ranelle Oyster, MD, [place] [facility]     10/02/2012

## 2012-10-02 NOTE — Discharge Instructions (Signed)
STROKE/TIA DISCHARGE INSTRUCTIONS SMOKING Cigarette smoking nearly doubles your risk of having a stroke & is the single most alterable risk factor  If you smoke or have smoked in the last 12 months, you are advised to quit smoking for your health.  Most of the excess cardiovascular risk related to smoking disappears within a year of stopping.  Ask you doctor about anti-smoking medications  [place] Quit Line: 1-800-QUIT NOW  Free Smoking Cessation Classes [phone])  CHOLESTEROL Know your levels; limit fat & cholesterol in your diet  Lipid Panel  No results found for this basename: chol, trig, hdl, cholhdl, vldl, ldlcalc      Many patients benefit from treatment even if their cholesterol is at goal.  Goal: Total Cholesterol (CHOL) less than 160  Goal:  Triglycerides (TRIG) less than 150  Goal:  HDL greater than 40  Goal:  LDL (LDLCALC) less than 100   BLOOD PRESSURE American Stroke Association blood pressure target is less that 120/80 mm/Hg  Your discharge blood pressure is:  BP: 139/67 mmHg  Monitor your blood pressure  Limit your salt and alcohol intake  Many individuals will require more than one medication for high blood pressure  DIABETES (A1c is a blood sugar average for last 3 months) Goal HGBA1c is under 7% (HBGA1c is blood sugar average for last 3 months)  Diabetes: {STROKE DC DIABETES:22357}    No results found for this basename: HGBA1C     Your HGBA1c can be lowered with medications, healthy diet, and exercise.  Check your blood sugar as directed by your physician  Call your physician if you experience unexplained or low blood sugars.  PHYSICAL ACTIVITY/REHABILITATION Goal is 30 minutes at least 4 days per week    Activity:   No restrictions.  Activity decreases your risk of heart attack and stroke and makes your heart stronger.  It helps control your weight and blood pressure; helps you relax and can improve your mood.  Participate in a regular exercise  program.  Talk with your doctor about the best form of exercise for you (dancing, walking, swimming, cycling).  DIET/WEIGHT Goal is to maintain a healthy weight  Your discharge diet is: NPO liquids Your height is:  Height: 6\' 2"  (188 cm) Your current weight is: Weight: 109.6 kg (241 lb 10 oz) Your Body Mass Index (BMI) is:  BMI (Calculated): 30.7  Following the type of diet specifically designed for you will help prevent another stroke.  Your goal weight range is:    Your goal Body Mass Index (BMI) is 19-24.  Healthy food habits can help reduce 3 risk factors for stroke:  High cholesterol, hypertension, and excess weight.  RESOURCES Stroke/Support Group:  Call [phone]  they meet the 3rd Sunday of the month on the Rehab Unit at [facility], [place] ( no meetings June, July & Aug).  STROKE EDUCATION PROVIDED/REVIEWED AND GIVEN TO PATIENT Stroke warning signs and symptoms How to activate emergency medical system (call 911). Medications prescribed at discharge. Need for follow-up after discharge. Personal risk factors for stroke. Pneumonia vaccine given:   {STROKE DC YES/NO/DATE:22363} Flu vaccine given:   {STROKE DC YES/NO/DATE:22363} My questions have been answered, the writing is legible, and I understand these instructions.  I will adhere to these goals & educational materials that have been provided to me after my discharge from the hospital.

## 2012-10-02 NOTE — Progress Notes (Signed)
Report was called to RN on unit 4700. Pt will transfer to room 4741

## 2012-10-02 NOTE — Progress Notes (Signed)
*  PRELIMINARY RESULTS* Vascular Ultrasound Carotid Duplex (Doppler) has been completed.  Preliminary findings: Right = 40-59% ICA stenosis. Left =  Borderline 60-79% ICA stenosis. Significant calcific plaque throughout bilateral carotids.  Farrel Demark, RDMS, RVT  10/02/2012, 5:58 PM

## 2012-10-02 NOTE — Progress Notes (Signed)
  Echocardiogram 2D Echocardiogram has been performed.  Richard Chavez 10/02/2012, 10:18 AM 

## 2012-10-02 NOTE — Progress Notes (Signed)
Stroke Team Progress Note  HISTORY Richard Chavez is an 79 y.o. male white male with PMH of CAD s/p CABG, CHF EF 30-35%, s/p PPM, HTN, and COPD presenting to the ED today 10/01/2012 alongside his son with complaints of worsening fatigue, generalized weakness, left arm weakness, SOB, fever, and chills. History is mainly obtained by son, [name], who explains that his father started complaining of left hand numbness yesterday morning (09/30/2012) ~11am. They went to the PCP office and returned home later that day thought to have numbness secondary to neuropathy. He was still moving his arm and able to drive and walk at that time, however, upon waking up this morning, he was confused, not moving his left arm at all, and had difficulty walking. [name] does not have any similar prior episodes or any history of CVA. His son does claim he takes an ASA daily, however, it is not on his medication list on epic and did not get any ASA this morning. Of note, he was recently discharged from the hospital on 09/17/12 for CAP on levofloxacin course.   Patient was not a TPA candidate secondary to delay in arrival. He was admitted for further evaluation and treatment.  SUBJECTIVE No family is at the bedside.  Overall he feels his condition is stable. He wants to get OOB and use the bathroom.  OBJECTIVE Most recent Vital Signs: Filed Vitals:   10/02/12 0700 10/02/12 0749 10/02/12 0800 10/02/12 0856  BP:  139/67 129/54   Pulse:  82 88   Temp:  98.5 F (36.9 C)    TempSrc:  Oral    Resp:  16 17   Height:      Weight: 109.6 kg (241 lb 10 oz)     SpO2:  97% 94% 96%   CBG (last 3)  No results found for this basename: GLUCAP,  in the last 72 hours  IV Fluid Intake:     MEDICATIONS  . aspirin EC  325 mg Oral Daily   Or  . aspirin  300 mg Rectal Daily  . atorvastatin  20 mg Oral Q breakfast  . furosemide  40 mg Intravenous BID  . gabapentin  600 mg Oral TID  . guaiFENesin  1,200 mg Oral BID  .  Ipratropium-Albuterol  2 puff Inhalation BID  . loratadine  10 mg Oral Q breakfast  . magnesium chloride  1 tablet Oral BID  . piperacillin-tazobactam (ZOSYN)  IV  3.375 g Intravenous Q8H  . potassium chloride  10 mEq Oral BID  . vancomycin  1,500 mg Intravenous Q24H  . [START ON 10/03/2012] Vitamin D (Ergocalciferol)  50,000 Units Oral Q7 days  . white petrolatum       PRN:    Diet:  NPO  Activity:   DVT Prophylaxis:  SCDs   CLINICALLY SIGNIFICANT STUDIES Basic Metabolic Panel:   Recent Labs Lab 10/01/12 0846  NA 136  K 4.2  CL 99  CO2 22  GLUCOSE 88  BUN 20  CREATININE 1.42*  CALCIUM 8.8   Liver Function Tests:   Recent Labs Lab 10/01/12 0846  AST 14  ALT 12  ALKPHOS 59  BILITOT 0.9  PROT 6.6  ALBUMIN 3.1*   CBC:   Recent Labs Lab 10/01/12 0846 10/02/12 0510  WBC 8.8 16.2*  NEUTROABS 7.3  --   HGB 14.4 13.3  HCT 43.6 38.4*  MCV 87.2 86.7  PLT 210 PLATELET CLUMPS NOTED ON SMEAR, COUNT APPEARS ADEQUATE   Coagulation: No results  found for this basename: LABPROT, INR,  in the last 168 hours Cardiac Enzymes:   Recent Labs Lab 10/01/12 0849  TROPONINI <0.30   Urinalysis:   Recent Labs Lab 10/01/12 0925  COLORURINE YELLOW  LABSPEC 1.015  PHURINE 7.0  GLUCOSEU NEGATIVE  HGBUR NEGATIVE  BILIRUBINUR NEGATIVE  KETONESUR NEGATIVE  PROTEINUR NEGATIVE  UROBILINOGEN 1.0  NITRITE NEGATIVE  LEUKOCYTESUR NEGATIVE   Lipid Panel No results found for this basename: chol,  trig,  hdl,  cholhdl,  vldl,  ldlcalc   HgbA1C  No results found for this basename: HGBA1C    Urine Drug Screen:   No results found for this basename: labopia,  cocainscrnur,  labbenz,  amphetmu,  thcu,  labbarb    Alcohol Level: No results found for this basename: ETH,  in the last 168 hours  CT of the brain  10/01/2012   1.  No acute intracranial abnormality 2.  Stable mild atrophy and chronic white matter ischemic changes 3.  Intracranial atherosclerosis    MRI of the brain     MRA of the brain    2D Echocardiogram    Carotid Doppler    CXR  10/01/2012    Worsened aeration, especially on the right.  Suspicious for alveolar pulmonary edema versus multifocal infection.  Degraded exam, especially the lateral view.    EKG  sinus tachycardia.   Therapy Recommendations   Physical Exam   Obese elderly Caucasian male in mild respiratory distress.Awake alert. Afebrile. Head is nontraumatic. Neck is supple without bruit. Hearing is normal. Cardiac exam no murmur or gallop. Lungs are clear to auscultation. Distal pulses are well felt.1 + pedal edema bilaterally. Neurological Exam ; Awake alert oriented x 3 normal speech and language. Mild left lower face asymmetry. Tongue midline. Mild LUE drift.LUE 4/5 weakness Mild diminished fine finger movements on left. Orbits right over left upper extremity. Mild left grip weak.Marland KitchenLLE 4/5 weakness.Diminished left hemibody sensation . Normal coordination. Gait deferred. ASSESSMENT Richard Chavez is a 79 y.o. male presenting with worsening fatigue, generalized weakness, left arm weakness, SOB, fever, and chills. Initial CT imaging negative; unable to have MRI due to pacer. Suspect right brain infarct based on symptoms. Infarct felt to be thrombotic secondary to small vessel disease.  On aspirin 325 mg orally every day prior to admission. Now on aspirin 325 mg orally every day for secondary stroke prevention. Patient with resultant left facial weakness, left hemiparesis. Work up underway.  Hypertension Hyperlipidemia, LDL pending, on lipitor 20 mg daily PTA, now on lipitor 20 mg daily, goal LDL < 100 (< 70 for diabetics) CAD - CABG 1978, pacemaker, MI OSA PVD Ischemic cardiomyopathy EF 35-40% COPD  Hospital day # 1  TREATMENT/PLAN  Change aspirin to clopidogrel 75 mg orally every day for secondary stroke prevention.  Repeat CT to confirm suspected stroke  OOB. Therapy evals. Rehab consult  TCD to look at vasculature  F/u  2D, carotid doppler, HgbA1c, lipids  Add Lovenox 40 mg sq daily for VTE prophy  Annie Main, MSN, RN, ANVP-BC, ANP-BC, GNP-BC [facility] Pager: [phone] 10/02/2012 9:10 AM  I have personally obtained a history, examined the patient, evaluated imaging results, and formulated the assessment and plan of care. I agree with the above. Delia Heady, MD

## 2012-10-03 ENCOUNTER — Encounter (HOSPITAL_COMMUNITY): Payer: Self-pay | Admitting: Radiology

## 2012-10-03 ENCOUNTER — Inpatient Hospital Stay (HOSPITAL_COMMUNITY): Payer: Medicare Other

## 2012-10-03 ENCOUNTER — Ambulatory Visit: Payer: Medicare Other | Admitting: Cardiology

## 2012-10-03 DIAGNOSIS — I480 Paroxysmal atrial fibrillation: Secondary | ICD-10-CM

## 2012-10-03 DIAGNOSIS — K56609 Unspecified intestinal obstruction, unspecified as to partial versus complete obstruction: Secondary | ICD-10-CM

## 2012-10-03 DIAGNOSIS — N179 Acute kidney failure, unspecified: Secondary | ICD-10-CM

## 2012-10-03 DIAGNOSIS — N189 Chronic kidney disease, unspecified: Secondary | ICD-10-CM

## 2012-10-03 DIAGNOSIS — Z950 Presence of cardiac pacemaker: Secondary | ICD-10-CM

## 2012-10-03 DIAGNOSIS — E875 Hyperkalemia: Secondary | ICD-10-CM

## 2012-10-03 DIAGNOSIS — I4891 Unspecified atrial fibrillation: Secondary | ICD-10-CM

## 2012-10-03 DIAGNOSIS — I472 Ventricular tachycardia: Secondary | ICD-10-CM

## 2012-10-03 HISTORY — DX: Paroxysmal atrial fibrillation: I48.0

## 2012-10-03 LAB — COMPREHENSIVE METABOLIC PANEL
AST: 54 U/L — ABNORMAL HIGH (ref 0–37)
Albumin: 2.8 g/dL — ABNORMAL LOW (ref 3.5–5.2)
Calcium: 9.2 mg/dL (ref 8.4–10.5)
Creatinine, Ser: 1.99 mg/dL — ABNORMAL HIGH (ref 0.50–1.35)

## 2012-10-03 LAB — CBC
MCH: 28.3 pg (ref 26.0–34.0)
MCV: 87.3 fL (ref 78.0–100.0)
Platelets: 206 10*3/uL (ref 150–400)
RDW: 16.8 % — ABNORMAL HIGH (ref 11.5–15.5)
WBC: 14.5 10*3/uL — ABNORMAL HIGH (ref 4.0–10.5)

## 2012-10-03 LAB — PRO B NATRIURETIC PEPTIDE: Pro B Natriuretic peptide (BNP): 993.5 pg/mL — ABNORMAL HIGH (ref 0–450)

## 2012-10-03 MED ORDER — HYDROCORTISONE SOD SUCCINATE 100 MG IJ SOLR
25.0000 mg | Freq: Two times a day (BID) | INTRAMUSCULAR | Status: DC
  Administered 2012-10-03 – 2012-10-05 (×6): 25 mg via INTRAVENOUS
  Administered 2012-10-06: 12:00:00 via INTRAVENOUS
  Administered 2012-10-07 – 2012-10-08 (×4): 25 mg via INTRAVENOUS
  Filled 2012-10-03 (×12): qty 0.5

## 2012-10-03 MED ORDER — MAGNESIUM HYDROXIDE 400 MG/5ML PO SUSP
5.0000 mL | Freq: Every day | ORAL | Status: DC | PRN
  Administered 2012-10-03: 5 mL via ORAL
  Filled 2012-10-03: qty 30

## 2012-10-03 MED ORDER — METOPROLOL TARTRATE 1 MG/ML IV SOLN
INTRAVENOUS | Status: AC
  Administered 2012-10-03: 2.5 mg via INTRAVENOUS
  Filled 2012-10-03: qty 5

## 2012-10-03 MED ORDER — SODIUM CHLORIDE 0.9 % IV SOLN
INTRAVENOUS | Status: DC
  Administered 2012-10-03: 13:00:00 via INTRAVENOUS

## 2012-10-03 MED ORDER — METOPROLOL TARTRATE 1 MG/ML IV SOLN
5.0000 mg | Freq: Four times a day (QID) | INTRAVENOUS | Status: DC
  Administered 2012-10-03 – 2012-10-04 (×3): 5 mg via INTRAVENOUS
  Filled 2012-10-03 (×8): qty 5

## 2012-10-03 MED ORDER — VANCOMYCIN HCL IN DEXTROSE 1-5 GM/200ML-% IV SOLN
1000.0000 mg | INTRAVENOUS | Status: DC
  Administered 2012-10-03 – 2012-10-05 (×3): 1000 mg via INTRAVENOUS
  Filled 2012-10-03 (×4): qty 200

## 2012-10-03 MED ORDER — ASPIRIN 300 MG RE SUPP
300.0000 mg | Freq: Every day | RECTAL | Status: DC
  Administered 2012-10-03 – 2012-10-04 (×2): 300 mg via RECTAL
  Filled 2012-10-03 (×3): qty 1

## 2012-10-03 MED ORDER — FUROSEMIDE 10 MG/ML IJ SOLN: 20.0000 mg | Freq: Two times a day (BID) | INTRAMUSCULAR | Status: DC

## 2012-10-03 MED ORDER — METOPROLOL TARTRATE 1 MG/ML IV SOLN
2.5000 mg | INTRAVENOUS | Status: DC | PRN
  Administered 2012-10-03: 2.5 mg via INTRAVENOUS
  Filled 2012-10-03: qty 5

## 2012-10-03 NOTE — Consult Note (Signed)
I have seen and examined the pt and agree with PA-Reibock's progress note. Pt states last BM was 3d ago.  Pt con't to pass gas, however KUB with dilated loops SB and Stomach.  Pt with emesis x3 since admission.  Pt may have pSBO at this time.  No surgical plans.  NGT NPO KUB in AM

## 2012-10-03 NOTE — Progress Notes (Signed)
[facility]  Subjective: No CP, SOB, dizziness  Objective: Vital signs in last 24 hours: Temp:  [97.4 F (36.3 C)-99.1 F (37.3 C)] 98 F (36.7 C) (05/30 0534) Pulse Rate:  [49-111] 80 (05/30 0534) Resp:  [17-26] 22 (05/30 0534) BP: (129-166)/(40-77) 151/70 mmHg (05/30 0534) SpO2:  [94 %-97 %] 95 % (05/30 0534) Weight:  [231 lb 1.6 oz (104.826 kg)] 231 lb 1.6 oz (104.826 kg) (05/30 0534) Last BM Date: 09/29/12  Intake/Output from previous day: 05/29 0701 - 05/30 0700 In: 690 [P.O.:90; IV Piggyback:600] Out: 2225 [Urine:2225] Intake/Output this shift:    Medications Current Facility-Administered Medications  Medication Dose Route Frequency Provider Last Rate Last Dose  . atorvastatin (LIPITOR) tablet 20 mg  20 mg Oral Q breakfast Rhetta Mura, MD   20 mg at 10/02/12 0803  . clopidogrel (PLAVIX) tablet 75 mg  75 mg Oral Q breakfast Layne Benton, NP   75 mg at 10/02/12 1052  . docusate sodium (COLACE) capsule 100 mg  100 mg Oral Daily Marykay Lex, MD   100 mg at 10/02/12 1052  . enoxaparin (LOVENOX) injection 40 mg  40 mg Subcutaneous Q24H Layne Benton, NP   40 mg at 10/02/12 1052  . furosemide (LASIX) injection 40 mg  40 mg Intravenous BID Rhetta Mura, MD   40 mg at 10/02/12 1730  . gabapentin (NEURONTIN) capsule 600 mg  600 mg Oral TID Rhetta Mura, MD   600 mg at 10/02/12 2133  . guaiFENesin (MUCINEX) 12 hr tablet 1,200 mg  1,200 mg Oral BID Rhetta Mura, MD   1,200 mg at 10/02/12 2133  . Ipratropium-Albuterol (COMBIVENT) respimat 2 puff  2 puff Inhalation BID Rhetta Mura, MD   2 puff at 10/02/12 2127  . loratadine (CLARITIN) tablet 10 mg  10 mg Oral Q breakfast Rhetta Mura, MD   10 mg at 10/02/12 0803  . magnesium chloride (SLOW-MAG) 64 MG SR tablet 64 mg  1 tablet Oral BID Rhetta Mura, MD   64 mg at 10/02/12 2133  . magnesium hydroxide (MILK OF MAGNESIA) suspension 5 mL  5 mL Oral Daily  PRN Gerome Apley Harduk, PA-C   5 mL at 10/03/12 0146  . ondansetron (ZOFRAN) injection 4 mg  4 mg Intravenous Q8H PRN Gerome Apley Harduk, PA-C   4 mg at 10/02/12 2008  . piperacillin-tazobactam (ZOSYN) IVPB 3.375 g  3.375 g Intravenous Q8H Ann Held, RPH   3.375 g at 10/03/12 0503  . potassium chloride (K-DUR) CR tablet 10 mEq  10 mEq Oral BID Rhetta Mura, MD   10 mEq at 10/02/12 2133  . predniSONE (DELTASONE) tablet 5 mg  5 mg Oral Q breakfast Leroy Sea, MD      . vancomycin (VANCOCIN) 1,500 mg in sodium chloride 0.9 % 500 mL IVPB  1,500 mg Intravenous Q24H Ann Held, RPH   1,500 mg at 10/02/12 1635  . Vitamin D (Ergocalciferol) (DRISDOL) capsule 50,000 Units  50,000 Units Oral Q7 days Rhetta Mura, MD        PE: General appearance: alert, cooperative and no distress Lungs: decreased BS on the left.  Mild rales Heart: irregularly irregular rhythm and no MM.  Rate elevated. ABD:  Distended, Nontender, minimal BS Extremities: No LEE Pulses: 2+ and symmetric 1+ PT pulses. Skin: One skin tear on the right arm and two on the left, one of which is new and about 4cm long Neurologic: Unable to flex at the  elbow or fingers.  Able to move left leg. Oriented to place but not year or month.   Lab Results:   Recent Labs  10/01/12 0846 10/02/12 0510 10/03/12 0625  WBC 8.8 16.2* 14.5*  HGB 14.4 13.3 14.5  HCT 43.6 38.4* 44.7  PLT 210 PLATELET CLUMPS NOTED ON SMEAR, COUNT APPEARS ADEQUATE 206   BMET  Recent Labs  10/01/12 0846 10/02/12 0820  NA 136 136  K 4.2 3.7  CL 99 100  CO2 22 21  GLUCOSE 88 78  BUN 20 27*  CREATININE 1.42* 2.11*  CALCIUM 8.8 9.0   PT/INR No results found for this basename: LABPROT, INR,  in the last 72 hours Cholesterol  Recent Labs  10/02/12 0820  CHOL 92    Assessment/Plan   Principal Problem:   Stroke, lacunar Active Problems:   COPD (chronic obstructive pulmonary disease)   CAD CABG 1998, patent grafts 06/06/11  after abn Myoview   Pacemaker, St Jude, implanted 2008   Cardiomyopathy, ischemic, EF 30-35% by recent cath   Chronic combined systolic and diastolic congestive heart failure   Chronic kidney disease, stage III (moderate)   Acute on chronic combined systolic and diastolic congestive heart failure, NYHA class 3   Possible reccuring Aspiration pneumonia, worsened by potential CVA  Plan:  Patient went into rapid HR while  in Radiology.  Looks like afib RVR with multiple PVCs/cuplets/ Wide complex vt.  Ocassional paced beat.  The patient was on 25mg  oral lopressor prior to admission.  Giving 2.5 mg IV lopressor now and PRN.  He is NPO.  CT Abd shows SBO pattern.   Checking Magnesium.  Avoiding CCB due to cardiomyopathy.  On Plavix.  Will interrogate pacer.  Net fluids:  -2.2L since adm.  Repeating BNP.   Decreasing Lasix to 20mg  BID IV.  Mild decrease in SCr.   Hyperkalemia: 5.4.   Holding potassium    LOS: 2 days    [name], [name] 10/03/2012 7:56 AM  I have seen and examined the patient along with [name], PA.  I have reviewed the chart, notes and new data.  I agree with PA's note.  Key new complaints: no dyspnea, angina or abdominal pain Key examination changes: irregular tachycardia, distended abdomen with very few bowel sounds Key new findings / data: interrogation of his pacemaker shows new onset lengthy episodes of atrial fibrillation on May 15 and again on May 22, raising possibility of cardioembolic stroke.  Monitor shows frequent wide complex beats and brief runs of wide complex tachycardia on background of AF w RVR and rare V pacing. Some of the wide complex beats are due to abberant conduction, but others appear to be convincing episodes of nonsustained VT  Suspect RVR and NSVT are expressions of beta blocker rebound and he will need IV beta blockers until bowel problems resolve. Long term warfarin anticoagulation or equivalent is indicated. Unclear if SBO might be secondary to  embolic mesenteric infarction or mechanical obstruction. Discuss with Neurology when to start anticoagulation. No signs of acute heart failure, but volemic control will be a challenge due to abdominal "third spacing".  Thurmon Fair, MD, [facility] [phone] 10/03/2012, 11:27 AM

## 2012-10-03 NOTE — Progress Notes (Signed)
Rehab Admissions Coordinator Note:  Patient was screened by Trish Mage for appropriateness for an Inpatient Acute Rehab Consult.  An inpatient rehab consult was done on 05/29 by Dr. Riley Kill.  Lutricia Horsfall, Admissions Coordinator is following this patient.  She can be reached at [phone].   Trish Mage 10/03/2012, 8:26 AM

## 2012-10-03 NOTE — Progress Notes (Signed)
[facility] PROGRESS NOTE  Richard Chavez WJX:[id number] DOB: 20-Jun-1926 DOA: 10/01/2012 PCP: Thayer Headings, MD  Brief narrative 79 year old male with extensive past medical history including CAD, CABG, ischemic cardiomyopathy, EF 35-40% 3/13, PPM, chronic combined systolic/diastolic CHF HTN, HL, GERD, COPD, chronic kidney disease, recent hospitalization 5/12-5/14 for community-acquired pneumonia presented on 5/28 with complaints of numbness in left hand, chills, weakness, mild productive sputum, fevers, mild SOB and leg edema. In the ED, chest x-ray suggestive of pulmonary edema, ABG showed pH of of 72%, creatinine 1.42, proBNP 1059. Hospitalist admission requested.    Assessment/Plan: 1. Subacute right brain infarct: Initial CT imaging was negative but subsequent one showed subacute stroke. Unable to do MRI secondary to pacer. Was switched to oral Plavix from aspirin. However due to current n.p.o. status for SBO-will be changed back to rectal aspirin. His CVA could be embolic from A. fib-neurology does not recommend IV heparin at this time but may eventually need anticoagulation. Statin will be held secondary to n.p.o. Complete stroke workup. 2. Small bowel obstruction: Unclear etiology. N.p.o. Surgery consulted. Patient appears clinically dry. Hold IV Lasix and start IV fluids. 3. Acute on on chronic kidney disease stage III: Likely precipitated by diuresis. Hold Lasix. Treat with IV fluids and follow BMP. 4. Hyperkalemia: Secondary to acute renal failure and potassium supplements. Hold potassium supplements. IV fluids and follow BMP in a.m. 5. A. fib with RVR and NSVT: Management per cardiology. Beta blocker changed to IV metoprolol. Cardiology recommends long-term anticoagulation. 6. Shortness of breath-multifactorial-potentially aspiration pneumonia +/-CHF exacerbation-patient's dry weight is 233 pounds. Continue daily weights, strict in and out. Clinically dry and patient denies dyspnea.  Brief IV fluids and hold Lasix.  3. Potential aspiration pneumonia : Agree with empiric Vancomycin/Zosyn for now. CT abdomen showed right middle lobe and lower lobe opacities. Will need followup imaging to ensure resolution. Will get speech therapist input to rule out overt oropharyngeal dysphagia. Oxygen and nebs to continue as needed. 4. History of CAD/cardiomyopathy/CHF NYHA class II- cardiology input appreciated. 5. COPD history-continue Combivent inhaler 2 puff twice a day. Continue home dose prednisone 5 mg tablet-changed to IV hydrocortisone secondary to n.p.o.. Continue tiotropium 18 mcg daily every morning. COPD appears to be at baseline. No wheezing. 6. Hypotension-seems like a chronic issue.  resolved.  7. Hyperlipidemia continue Lipitor 20 mg daily with breakfast. Lipid panel stable.   Code Status: Full Family Communication: None Disposition Plan: To be determined   Consultants:  Cardiology  Neurology  Rehab MD  General surgery  Procedures:  None  Antibiotics:  Vancomycin  Zosyn   HPI/Subjective:  patient denies abdominal pain or dyspnea. Passing flatus but no BM for 3 days. Early this morning, had tachycardia while in CT.   Objective: Filed Vitals:   10/03/12 0814 10/03/12 0931 10/03/12 1417 10/03/12 1433  BP: 133/86  121/66 117/60  Pulse:   130 55  Temp:    98.6 F (37 C)  TempSrc:    Oral  Resp:    20  Height:      Weight:      SpO2:  96%  96%    Intake/Output Summary (Last 24 hours) at 10/03/12 1517 Last data filed at 10/03/12 1434  Gross per 24 hour  Intake    590 ml  Output   1600 ml  Net  -1010 ml   Filed Weights   10/01/12 1639 10/02/12 0700 10/03/12 0534  Weight: 108.4 kg (238 lb 15.7 oz) 109.6 kg (241 lb 10 oz) 104.826  kg (231 lb 1.6 oz)    Exam:   General exam: Comfortable. Mucosa dry   Respiratory system:  reduced breath sounds in the bases but otherwise clear to auscultation . No increased work of breathing. Midline  sternotomy scar.  Cardiovascular system: S1 & S2 heard,  irregular . No JVD, murmurs, gallops, clicks or pedal edema.  Gastrointestinal system: Abdomen is distended, soft and nontender.  Paucity of bowel sounds heard.  Central nervous system: Alert and oriented x2 . No focal neurological deficits.  Extremities: Symmetric 5 x 5 power.   Data Reviewed: Basic Metabolic Panel:  Recent Labs Lab 10/01/12 0846 10/02/12 0820 10/03/12 0625  NA 136 136 133*  K 4.2 3.7 5.4*  CL 99 100 93*  CO2 22 21 26   GLUCOSE 88 78 99  BUN 20 27* 27*  CREATININE 1.42* 2.11* 1.99*  CALCIUM 8.8 9.0 9.2  MG  --   --  2.5   Liver Function Tests:  Recent Labs Lab 10/01/12 0846 10/02/12 0820 10/03/12 0625  AST 14 22 54*  ALT 12 11 13   ALKPHOS 59 77 61  BILITOT 0.9 2.0* 1.6*  PROT 6.6 6.3 7.2  ALBUMIN 3.1* 2.8* 2.8*   No results found for this basename: LIPASE, AMYLASE,  in the last 168 hours No results found for this basename: AMMONIA,  in the last 168 hours CBC:  Recent Labs Lab 10/01/12 0846 10/02/12 0510 10/03/12 0625  WBC 8.8 16.2* 14.5*  NEUTROABS 7.3  --   --   HGB 14.4 13.3 14.5  HCT 43.6 38.4* 44.7  MCV 87.2 86.7 87.3  PLT 210 PLATELET CLUMPS NOTED ON SMEAR, COUNT APPEARS ADEQUATE 206   Cardiac Enzymes:  Recent Labs Lab 10/01/12 0849  TROPONINI <0.30   BNP (last 3 results)  Recent Labs  09/15/12 1130 10/01/12 0849 10/03/12 0625  PROBNP 1760.0* 1059.0* 993.5*   CBG: No results found for this basename: GLUCAP,  in the last 168 hours  Recent Results (from the past 240 hour(s))  CULTURE, BLOOD (ROUTINE X 2)     Status: None   Collection Time    10/01/12  1:49 PM      Result Value Range Status   Specimen Description BLOOD RIGHT FOREARM   Final   Special Requests BOTTLES DRAWN AEROBIC ONLY 6CC   Final   Culture  Setup Time 10/01/2012 17:12   Final   Culture     Final   Value:        BLOOD CULTURE RECEIVED NO GROWTH TO DATE CULTURE WILL BE HELD FOR 5 DAYS  BEFORE ISSUING A FINAL NEGATIVE REPORT   Report Status PENDING   Incomplete  CULTURE, BLOOD (ROUTINE X 2)     Status: None   Collection Time    10/01/12  1:55 PM      Result Value Range Status   Specimen Description BLOOD THUMB RIGHT   Final   Special Requests BOTTLES DRAWN AEROBIC ONLY 5CC   Final   Culture  Setup Time 10/01/2012 17:12   Final   Culture     Final   Value:        BLOOD CULTURE RECEIVED NO GROWTH TO DATE CULTURE WILL BE HELD FOR 5 DAYS BEFORE ISSUING A FINAL NEGATIVE REPORT   Report Status PENDING   Incomplete  MRSA PCR SCREENING     Status: None   Collection Time    10/01/12  4:29 PM      Result Value Range Status  MRSA by PCR NEGATIVE  NEGATIVE Final   Comment:            The GeneXpert MRSA Assay (FDA     approved for NASAL specimens     only), is one component of a     comprehensive MRSA colonization     surveillance program. It is not     intended to diagnose MRSA     infection nor to guide or     monitor treatment for     MRSA infections.     Studies: Ct Abdomen Pelvis Wo Contrast  10/03/2012   *RADIOLOGY REPORT*  Clinical Data: Mid abdominal pain  CT ABDOMEN AND PELVIS WITHOUT CONTRAST  Technique:  Multidetector CT imaging of the abdomen and pelvis was performed following the standard protocol without intravenous contrast.  Comparison: 06/30/2011  Findings: Calcified pleural plaques at the right lung base are stable.  Fibrotic changes at the lung bases are stable.  Ill- defined opacities in the right middle and lower lobe visualized on this study are present.  These are characterized by a focal rounded nodular areas with surrounding ground-glass and interstitial changes.  Several areas are present.  The largest is 2.0 cm in the right middle lobe on image #1.  Coronary artery calcifications.  Pacemaker device is present.  Post cholecystectomy.  Liver, spleen, pancreas, adrenal glands are within normal limits.  Atherosclerotic changes of the aorta are present.   Maximal AP diameter of the aorta is 3.7 cm.  This is not significantly changed.  Non-visualized appendix.  Sigmoid diverticulosis is present without evidence of acute diverticulitis.  Proximal small bowel is dilated with air-fluid levels.  Distal small bowel is completely decompressed.  Colon is decompressed. The focal transition point is not clearly visualized.  The transition point probably occurs in the proximal or mid ileum within the right side of the abdomen.  Foley catheter decompresses the bladder.  Stable prostate gland.  Degenerative disc disease at L4-5 and L5-S1.  Simple cysts in the left kidney are stable.  IMPRESSION: Small bowel obstruction pattern as described.  The exact transition point is not clearly evident in this may be due to adhesions.  There are several ill-defined opacities in the right middle and lower lobes.  The finding is nonspecific and likely represents an inflammatory process.  Malignancy is not entirely excluded.  Short- term follow-up studies are recommended to ensure resolution of these findings.  If there is strong concern for malignancy, PET CT can be performed.  Stable chronic findings.   Original Report Authenticated By: Jolaine Click, M.D.   Ct Head Wo Contrast  10/02/2012   *RADIOLOGY REPORT*  Clinical Data: Left arm weakness, fatigue, and headache.  CT HEAD WITHOUT CONTRAST  Technique:  Contiguous axial images were obtained from the base of the skull through the vertex without contrast.  Comparison: 10/01/2012  Findings: Despite efforts by the patient and technologist, motion artifact is present on some series of today's examination and could not be totally eliminated.  This reduces diagnostic sensitivity and specificity.  Cytotoxic edema and hypodensity in a band in the right frontal lobe on images 22-25 of series 2.  Chronic microvascular white matter disease noted.  No intracranial hemorrhage is observed.  Basal ganglia, brain stem, and cerebellum intact.   Atherosclerotic calcification of the carotid siphons noted.  IMPRESSION:  1.  Cytotoxic edema and hypodensity in the vicinity of the inferior frontal gyrus and precentral gyrus on the right, compatible with subacute stroke.  No  hemorrhagic transformation observed.   Original Report Authenticated By: Gaylyn Rong, M.D.   Dg Abd Portable 2v  10/03/2012   *RADIOLOGY REPORT*  Clinical Data: Abdominal pain and vomiting.  PORTABLE ABDOMEN - 2 VIEW  Comparison: CT of the abdomen and pelvis, and abdominal radiograph, performed 06/30/2011  Findings: There is dilatation of small bowel loops to 4.5 cm in maximal diameter, with distension of the stomach.  Associated air- fluid levels are seen.  Findings are concerning for small bowel obstruction, though residual stool is seen within the sigmoid colon.  No free intra-abdominal air is identified on the provided decubitus view.  The visualized lung bases are grossly clear.  The patient is status post median sternotomy.  No acute osseous abnormalities are seen.  IMPRESSION: Dilatation of small bowel loops to 4.5 cm in maximal diameter, with associated distension of the stomach.  Air-fluid levels noted. Findings concerning for small bowel obstruction; no free intra- abdominal air seen.  These results were called by telephone on 10/03/2012 at 05:04 a.m. to [facility] on [id number], who verbally acknowledged these results.   Original Report Authenticated By: Tonia Ghent, M.D.     Additional labs:   Scheduled Meds: . aspirin  300 mg Rectal Daily  . atorvastatin  20 mg Oral Q breakfast  . docusate sodium  100 mg Oral Daily  . enoxaparin (LOVENOX) injection  40 mg Subcutaneous Q24H  . gabapentin  600 mg Oral TID  . guaiFENesin  1,200 mg Oral BID  . hydrocortisone sod succinate (SOLU-CORTEF) inj  25 mg Intravenous Q12H  . Ipratropium-Albuterol  2 puff Inhalation BID  . loratadine  10 mg Oral Q breakfast  . magnesium chloride  1 tablet Oral BID  . metoprolol  5  mg Intravenous Q6H  . piperacillin-tazobactam (ZOSYN)  IV  3.375 g Intravenous Q8H  . vancomycin  1,000 mg Intravenous Q24H  . Vitamin D (Ergocalciferol)  50,000 Units Oral Q7 days   Continuous Infusions: . sodium chloride 75 mL/hr at 10/03/12 1316    Principal Problem:   Stroke, lacunar Active Problems:   COPD (chronic obstructive pulmonary disease)   CAD CABG 1998, patent grafts 06/06/11 after abn Myoview   Pacemaker, St Jude, implanted 2008   Cardiomyopathy, ischemic, EF 30-35% by recent cath   Chronic combined systolic and diastolic congestive heart failure   Chronic kidney disease, stage III (moderate)   Acute on chronic combined systolic and diastolic congestive heart failure, NYHA class 3   Possible reccuring Aspiration pneumonia, worsened by potential CVA   Atrial fibrillation with RVR    Time spent:  40 minutes     [facility]  [facility] Pager [phone].   If 8PM-8AM, please contact night-coverage at [email or web address], password [facility] 10/03/2012, 3:17 PM  LOS: 2 days

## 2012-10-03 NOTE — Significant Event (Signed)
Rapid Response Event Note  Overview:  Called to assist with patient with elevated HR. Time Called: 0725 Arrival Time: 0730    Initial Focused Assessment:  On arrival patient arousal and alert - follows commands- denies chest pain or SOB - skin warm and dry - bil BS present - distant - O2 via 2 liter nasal cannula - O2 sat 96% - RR regular and unlabored - abd large slightly firm - denies pain or nausea at this time.  HR rapid rhythm - some PVC's vs BBB with afib RVR pattern.   Labs for this AM drawn but results pending.  Home metoprolol has been on hold since admission.  Creatine climbing - at 2.11 yesterday - has CRI stage III - baseline around 1.4-1.5.  P 156/61 HR 120-140 RR 22.     Interventions:  Dr, Herbie Baltimore notified prior to my arrival.  RN Sharyl Nimrod now speaking with Arlys John PA.  Huey Bienenstock present.  0815 PB 139/86  HR 130 2.5 mg Metoprolol IV given.  0820 142/58 HR 113-125.   Patient remains asx.  0829  2.5 mg Metoprolol IV given per order Huey Bienenstock PA.  0840:  BP 119/79  HR 112.  0900:  Bp 106/62  HR 110 RR 22 O2 sat 95%.  Huey Bienenstock aware - care returned to [facility].  Handoff report given.    Event Summary: Name of Physician Notified: Dr. Herbie Baltimore at  (prior to my arrival)    at    Outcome: Stayed in room and stabalized  Event End Time: 0900  [place]

## 2012-10-03 NOTE — Progress Notes (Signed)
Explained procedure to pt. Inserted Nasogastric tube # 16  Lt nares to stomach. Immediate return of 200 cc green liquid. Tube secured to 2 nd marking. Pt tolerated procedure well. Placed to intermittent suction.

## 2012-10-03 NOTE — Consult Note (Signed)
Reason for Consult:small bowel obstruction Referring Physician: Dr. Theadora Rama D. Hongalgi  HPI: Richard Chavez is a pleasant 79 year old male with a past medical history of COPD, CAD s/p CABG, cardiomyopathy with EF 35-40%, pacemaker placement, hypertension, chronic renal failure, OSA and pneumonia who which he was hospitalized for 09/15/12-09/17/12.  Richard Chavez was readmitted 2 days ago weakness, fatigue, shortness of breath and fatigue.  Although a CT of head was negative, neurology suspects right brain infarct.  He was also found to be in atrial fibrillation with RVR and acute on chronic heart failure for which he is being followed for by cardiology.  A CT scan of abdomen and pelvic was obtained for complaints of abdominal pain which revealed a small bowel obstruction and therefore surgery has been consulted.  The patient is a poor historian.  He denies abdominal pain.  Reports his last BM was 3 days ago, but told Richard Chavez that it was last "Sunday.  He denies nausea or vomiting, no episodes recorded in I&Os.  No bms recorded.  He has a surgical history of cholecystectomy, no other abdominal surgeries.  Past Medical History  Diagnosis Date  . CAD (coronary artery disease), native coronary artery     Status post CABG  . Hypertension   . Hyperlipidemia   . GERD (gastroesophageal reflux disease)   . COPD (chronic obstructive pulmonary disease)   . CRF (chronic renal failure)   . Barrett's esophagus   . OP (osteoporosis)   . CAD (coronary artery disease) of artery bypass graft 07/04/2011    LIMA-LAD; SVG-OM2-OM3, SVG-RCA -- all patent as of 05/27/11  . Syncope 07/04/2011  . Pacemaker 07/04/2011  . Cardiomyopathy, ischemic 07/07/2011    2D Echo - EF 35-40, moderately dilated left atrium  . Myocardial infarction     25"  yrs ago  . Peripheral vascular disease   . Arthritis   . BPH (benign prostatic hyperplasia)   . Polyp of colon     removed  . Sleep apnea     STOP BANG SCORE 4  . AV block, 3rd degree      Post [employer] pacemaker, EF 35-40, does have intermittent atrial tachycardia, either PAt or Afib  . BPH (benign prostatic hypertrophy) 11/15/2011  . SOB (shortness of breath) on exertion 02/14/2010    2D Echo - EF 35-45,     Past Surgical History  Procedure Laterality Date  . Pacemaker placement  2007  . Cholecystectomy  2005  . Coronary artery bypass graft  1978  . Lesion excision      from penis  . Cardiac catheterization  06/06/2011    occluded proximal RCA, ostial LAD and mid Circumflex after OM 1.; [place] LIMA-LAD, patent SVG-OM1-OM 2, patent SVG-RCA. Reduced ejection fraction of 30-35%.  . Cardiac catheterization  06/14/2010    LIMA to LAD, SVG to RCA, SVG to OM1 Circumflex, 100% occluded RCA, 100% occluded circumfkex, 100% occluded LAD, no evidence of graft dysfunction, continue medical therapy  . Cardiac catheterization  04/12/2003    3 vessel coronary artery disease, continue medical treatment  . Cardiac catheterization  10/07/2000    Severe 3 vessel coronary artery disease, continue medical treatment    Family History  Problem Relation Age of Onset  . Leukemia Father   . Heart disease Father   . Alzheimer's disease Mother   . Arthritis Mother   . Lung cancer Son 64  . Alzheimer's disease Maternal Grandmother     Social History:  reports that  he quit smoking about 27 years ago. His smoking use included Cigarettes. He has a 4.5 pack-year smoking history. He has never used smokeless tobacco. He reports that he does not drink alcohol or use illicit drugs.  Allergies: No Known Allergies  Medications: I have reviewed the patient's current medications.  Results for orders placed during the hospital encounter of 10/01/12 (from the past 48 hour(s))  CULTURE, BLOOD (ROUTINE X 2)     Status: None   Collection Time    10/01/12  1:49 PM      Result Value Range   Specimen Description BLOOD RIGHT FOREARM     Special Requests BOTTLES DRAWN AEROBIC ONLY 6CC     Culture  Setup Time  10/01/2012 17:12     Culture       Value:        BLOOD CULTURE RECEIVED NO GROWTH TO DATE CULTURE WILL BE HELD FOR 5 DAYS BEFORE ISSUING A FINAL NEGATIVE REPORT   Report Status PENDING    CULTURE, BLOOD (ROUTINE X 2)     Status: None   Collection Time    10/01/12  1:55 PM      Result Value Range   Specimen Description BLOOD THUMB RIGHT     Special Requests BOTTLES DRAWN AEROBIC ONLY 5CC     Culture  Setup Time 10/01/2012 17:12     Culture       Value:        BLOOD CULTURE RECEIVED NO GROWTH TO DATE CULTURE WILL BE HELD FOR 5 DAYS BEFORE ISSUING A FINAL NEGATIVE REPORT   Report Status PENDING    MRSA PCR SCREENING     Status: None   Collection Time    10/01/12  4:29 PM      Result Value Range   MRSA by PCR NEGATIVE  NEGATIVE   Comment:            The GeneXpert MRSA Assay (FDA     approved for NASAL specimens     only), is one component of a     comprehensive MRSA colonization     surveillance program. It is not     intended to diagnose MRSA     infection nor to guide or     monitor treatment for     MRSA infections.  CBC     Status: Abnormal   Collection Time    10/02/12  5:10 AM      Result Value Range   WBC 16.2 (*) 4.0 - 10.5 K/uL   Comment: WHITE COUNT CONFIRMED ON SMEAR   RBC 4.43  4.22 - 5.81 MIL/uL   Hemoglobin 13.3  13.0 - 17.0 g/dL   HCT 40.9 (*) 81.1 - 91.4 %   MCV 86.7  78.0 - 100.0 fL   MCH 30.0  26.0 - 34.0 pg   MCHC 34.6  30.0 - 36.0 g/dL   RDW 78.2 (*) 95.6 - 21.3 %   Platelets    150 - 400 K/uL   Value: PLATELET CLUMPS NOTED ON SMEAR, COUNT APPEARS ADEQUATE  HEMOGLOBIN A1C     Status: None   Collection Time    10/02/12  5:10 AM      Result Value Range   Hemoglobin A1C 5.6  <5.7 %   Comment: (NOTE)  According to the ADA Clinical Practice Recommendations for 2011, when     HbA1c is used as a screening test:      >=6.5%   Diagnostic of Diabetes Mellitus               (if  abnormal result is confirmed)     5.7-6.4%   Increased risk of developing Diabetes Mellitus     References:Diagnosis and Classification of Diabetes Mellitus,Diabetes     Care,2011,34(Suppl 1):S62-S69 and Standards of Medical Care in             Diabetes - 2011,Diabetes Care,2011,34 (Suppl 1):S11-S61.   Mean Plasma Glucose 114  <117 mg/dL  COMPREHENSIVE METABOLIC PANEL     Status: Abnormal   Collection Time    10/02/12  8:20 AM      Result Value Range   Sodium 136  135 - 145 mEq/L   Potassium 3.7  3.5 - 5.1 mEq/L   Chloride 100  96 - 112 mEq/L   CO2 21  19 - 32 mEq/L   Glucose, Bld 78  70 - 99 mg/dL   BUN 27 (*) 6 - 23 mg/dL   Creatinine, Ser 1.61 (*) 0.50 - 1.35 mg/dL   Calcium 9.0  8.4 - 09.6 mg/dL   Total Protein 6.3  6.0 - 8.3 g/dL   Albumin 2.8 (*) 3.5 - 5.2 g/dL   AST 22  0 - 37 U/L   ALT 11  0 - 53 U/L   Alkaline Phosphatase 77  39 - 117 U/L   Total Bilirubin 2.0 (*) 0.3 - 1.2 mg/dL   GFR calc non Af Amer 27 (*) >90 mL/min   GFR calc Af Amer 32 (*) >90 mL/min   Comment:            The eGFR has been calculated     using the CKD EPI equation.     This calculation has not been     validated in all clinical     situations.     eGFR's persistently     <90 mL/min signify     possible Chronic Kidney Disease.  LIPID PANEL     Status: None   Collection Time    10/02/12  8:20 AM      Result Value Range   Cholesterol 92  0 - 200 mg/dL   Triglycerides 045  <409 mg/dL   HDL 45  >81 mg/dL   Total CHOL/HDL Ratio 2.0     VLDL 21  0 - 40 mg/dL   LDL Cholesterol 26  0 - 99 mg/dL   Comment:            Total Cholesterol/HDL:CHD Risk     Coronary Heart Disease Risk Table                         Men   Women      1/2 Average Risk   3.4   3.3      Average Risk       5.0   4.4      2 X Average Risk   9.6   7.1      3 X Average Risk  23.4   11.0                Use the calculated Patient Ratio     above and the CHD Risk Table     to determine the patient's CHD Risk.  ATP III CLASSIFICATION (LDL):      <100     mg/dL   Optimal      952-841  mg/dL   Near or Above                        Optimal      130-159  mg/dL   Borderline      324-401  mg/dL   High      >027     mg/dL   Very High  CBC     Status: Abnormal   Collection Time    10/03/12  6:25 AM      Result Value Range   WBC 14.5 (*) 4.0 - 10.5 K/uL   RBC 5.12  4.22 - 5.81 MIL/uL   Hemoglobin 14.5  13.0 - 17.0 g/dL   HCT 25.3  66.4 - 40.3 %   MCV 87.3  78.0 - 100.0 fL   MCH 28.3  26.0 - 34.0 pg   MCHC 32.4  30.0 - 36.0 g/dL   RDW 47.4 (*) 25.9 - 56.3 %   Platelets 206  150 - 400 K/uL  COMPREHENSIVE METABOLIC PANEL     Status: Abnormal   Collection Time    10/03/12  6:25 AM      Result Value Range   Sodium 133 (*) 135 - 145 mEq/L   Potassium 5.4 (*) 3.5 - 5.1 mEq/L   Comment: HEMOLYSIS AT THIS LEVEL MAY AFFECT RESULT   Chloride 93 (*) 96 - 112 mEq/L   CO2 26  19 - 32 mEq/L   Glucose, Bld 99  70 - 99 mg/dL   BUN 27 (*) 6 - 23 mg/dL   Creatinine, Ser 8.75 (*) 0.50 - 1.35 mg/dL   Calcium 9.2  8.4 - 64.3 mg/dL   Total Protein 7.2  6.0 - 8.3 g/dL   Albumin 2.8 (*) 3.5 - 5.2 g/dL   AST 54 (*) 0 - 37 U/L   ALT 13  0 - 53 U/L   Comment: HEMOLYSIS AT THIS LEVEL MAY AFFECT RESULT   Alkaline Phosphatase 61  39 - 117 U/L   Comment: HEMOLYSIS AT THIS LEVEL MAY AFFECT RESULT   Total Bilirubin 1.6 (*) 0.3 - 1.2 mg/dL   Comment: HEMOLYSIS AT THIS LEVEL MAY AFFECT RESULT   GFR calc non Af Amer 29 (*) >90 mL/min   GFR calc Af Amer 34 (*) >90 mL/min   Comment:            The eGFR has been calculated     using the CKD EPI equation.     This calculation has not been     validated in all clinical     situations.     eGFR's persistently     <90 mL/min signify     possible Chronic Kidney Disease.  MAGNESIUM     Status: None   Collection Time    10/03/12  6:25 AM      Result Value Range   Magnesium 2.5  1.5 - 2.5 mg/dL  PRO B NATRIURETIC PEPTIDE     Status: Abnormal   Collection Time     10/03/12  6:25 AM      Result Value Range   Pro B Natriuretic peptide (BNP) 993.5 (*) 0 - 450 pg/mL    Ct Abdomen Pelvis Wo Contrast  10/03/2012   *RADIOLOGY REPORT*  Clinical Data: Mid abdominal pain  CT ABDOMEN AND PELVIS WITHOUT CONTRAST  Technique:  Multidetector CT imaging of the abdomen and pelvis was performed following the standard protocol without intravenous contrast.  Comparison: 06/30/2011  Findings: Calcified pleural plaques at the right lung base are stable.  Fibrotic changes at the lung bases are stable.  Ill- defined opacities in the right middle and lower lobe visualized on this study are present.  These are characterized by a focal rounded nodular areas with surrounding ground-glass and interstitial changes.  Several areas are present.  The largest is 2.0 cm in the right middle lobe on image #1.  Coronary artery calcifications.  Pacemaker device is present.  Post cholecystectomy.  Liver, spleen, pancreas, adrenal glands are within normal limits.  Atherosclerotic changes of the aorta are present.  Maximal AP diameter of the aorta is 3.7 cm.  This is not significantly changed.  Non-visualized appendix.  Sigmoid diverticulosis is present without evidence of acute diverticulitis.  Proximal small bowel is dilated with air-fluid levels.  Distal small bowel is completely decompressed.  Colon is decompressed. The focal transition point is not clearly visualized.  The transition point probably occurs in the proximal or mid ileum within the right side of the abdomen.  Foley catheter decompresses the bladder.  Stable prostate gland.  Degenerative disc disease at L4-5 and L5-S1.  Simple cysts in the left kidney are stable.  IMPRESSION: Small bowel obstruction pattern as described.  The exact transition point is not clearly evident in this may be due to adhesions.  There are several ill-defined opacities in the right middle and lower lobes.  The finding is nonspecific and likely represents an inflammatory  process.  Malignancy is not entirely excluded.  Short- term follow-up studies are recommended to ensure resolution of these findings.  If there is strong concern for malignancy, PET CT can be performed.  Stable chronic findings.   Original Report Authenticated By: Jolaine Click, M.D.   Dg Abd Portable 2v  10/03/2012   *RADIOLOGY REPORT*  Clinical Data: Abdominal pain and vomiting.  PORTABLE ABDOMEN - 2 VIEW  Comparison: CT of the abdomen and pelvis, and abdominal radiograph, performed 06/30/2011  Findings: There is dilatation of small bowel loops to 4.5 cm in maximal diameter, with distension of the stomach.  Associated air- fluid levels are seen.  Findings are concerning for small bowel obstruction, though residual stool is seen within the sigmoid colon.  No free intra-abdominal air is identified on the provided decubitus view.  The visualized lung bases are grossly clear.  The patient is status post median sternotomy.  No acute osseous abnormalities are seen.  IMPRESSION: Dilatation of small bowel loops to 4.5 cm in maximal diameter, with associated distension of the stomach.  Air-fluid levels noted. Findings concerning for small bowel obstruction; no free intra- abdominal air seen.  These results were called by telephone on 10/03/2012 at 05:04 a.m. to [facility] on [id number], who verbally acknowledged these results.   Original Report Authenticated By: Tonia Ghent, M.D.    Review of Systems  Constitutional: Negative for fever, chills and diaphoresis.  Respiratory: Positive for shortness of breath. Negative for wheezing.   Cardiovascular: Negative for chest pain, palpitations and leg swelling.  Gastrointestinal: Positive for constipation. Negative for heartburn, nausea, vomiting, abdominal pain, diarrhea, blood in stool and melena.  Genitourinary: Negative for dysuria, hematuria and flank pain.  Musculoskeletal: Negative for back pain and joint pain.  Neurological: Positive for weakness. Negative for  dizziness, speech change, seizures and headaches.   Blood pressure 133/86, pulse 134, temperature 98 F (  36.7 C), temperature source Oral, resp. rate 22, height 6\' 2"  (1.88 m), weight 231 lb 1.6 oz (104.826 kg), SpO2 96.00%. Physical Exam  Constitutional: He is oriented to person, place, and time. He appears well-developed and well-nourished. No distress.  HENT:  Mouth/Throat: No oropharyngeal exudate.  Dry mucus membranes  Cardiovascular: S1 normal, S2 normal and intact distal pulses.  An irregular rhythm present. Tachycardia present.  Exam reveals no gallop, no S3, no S4 and no friction rub.   No murmur heard. Respiratory: Breath sounds normal. No respiratory distress. He has no wheezes. He has no rales. He exhibits no tenderness.  GI: Soft. Bowel sounds are normal. He exhibits no mass. There is no tenderness. There is no rebound and no guarding.  Musculoskeletal:  Trace BLE edema, non pitting  Lymphadenopathy:    He has no cervical adenopathy.  Neurological: He is alert and oriented to person, place, and time.  Skin: Skin is dry. No rash noted. He is not diaphoretic. No erythema. No pallor.  Psychiatric: He has a normal mood and affect. His behavior is normal.    Assessment/Plan: Small bowel obstruction -no surgical intervention at this time.  His white count is improving, like elevated from PNA for which he is currently on vanc/zosyn for. -place NGT to low intermittent suction due to dilated stomach, small bowel -NPO -gentle hydration, would appreciate cardiology input on hydration to avoid fluid overload -Repeat abdomen XR in the morning -Intake and output -encouraged ambulation, PT/OT working with pt -i spoke with his son Harlee Pursifull and updated him. His contact # [phone] -will continue to follow the patient  Medical problems managed by internal medicine, neurology, cardiology Stroke Cardiomyopathy A/C CHF Atrial fibrillation with RVR Pneumonia COPD CKD  Willam Munford,  [facility] ANP-BC Pager [phone]  10/03/2012, 12:40 PM

## 2012-10-03 NOTE — Progress Notes (Signed)
Stroke Team Progress Note  HISTORY ONESIMO Chavez is an 79 y.o. male white male with PMH of CAD s/p CABG, CHF EF 30-35%, s/p PPM, HTN, and COPD presenting to the ED today 10/01/2012 alongside his son with complaints of worsening fatigue, generalized weakness, left arm weakness, SOB, fever, and chills. History is mainly obtained by son, Richard Chavez, who explains that his father started complaining of left hand numbness yesterday morning (09/30/2012) ~11am. They went to the PCP office and returned home later that day thought to have numbness secondary to neuropathy. He was still moving his arm and able to drive and walk at that time, however, upon waking up this 10/02/28, he was confused, not moving his left arm at all, and had difficulty walking. Mr. Taglieri does not have any similar prior episodes or any history of CVA. His son does claim he takes an ASA daily, however, it is not on his medication list on epic and did not get any ASA this morning. Of note, he was recently discharged from the hospital on 09/17/12 for CAP on levofloxacin course.   Patient was not a TPA candidate secondary to delay in arrival. He was admitted for further evaluation and treatment.  SUBJECTIVE He feels that his condition is stable.  OBJECTIVE Most recent Vital Signs: Filed Vitals:   10/03/12 0534 10/03/12 0743 10/03/12 0806 10/03/12 0814  BP: 151/70 154/66 131/72 133/86  Pulse: 80 134    Temp: 98 F (36.7 C)     TempSrc: Oral     Resp: 22     Height:      Weight: 104.826 kg (231 lb 1.6 oz)     SpO2: 95%      CBG (last 3)  No results found for this basename: GLUCAP,  in the last 72 hours  IV Fluid Intake:     MEDICATIONS  . atorvastatin  20 mg Oral Q breakfast  . clopidogrel  75 mg Oral Q breakfast  . docusate sodium  100 mg Oral Daily  . enoxaparin (LOVENOX) injection  40 mg Subcutaneous Q24H  . furosemide  20 mg Intravenous BID  . gabapentin  600 mg Oral TID  . guaiFENesin  1,200 mg Oral BID  .  Ipratropium-Albuterol  2 puff Inhalation BID  . loratadine  10 mg Oral Q breakfast  . magnesium chloride  1 tablet Oral BID  . piperacillin-tazobactam (ZOSYN)  IV  3.375 g Intravenous Q8H  . predniSONE  5 mg Oral Q breakfast  . vancomycin  1,500 mg Intravenous Q24H  . Vitamin D (Ergocalciferol)  50,000 Units Oral Q7 days   PRN:    Diet:  NPO--feeds Activity:  Up as tolerated DVT Prophylaxis:  SCDs   CLINICALLY SIGNIFICANT STUDIES Basic Metabolic Panel:   Recent Labs Lab 10/02/12 0820 10/03/12 0625  NA 136 133*  K 3.7 5.4*  CL 100 93*  CO2 21 26  GLUCOSE 78 99  BUN 27* 27*  CREATININE 2.11* 1.99*  CALCIUM 9.0 9.2  MG  --  2.5   Liver Function Tests:   Recent Labs Lab 10/02/12 0820 10/03/12 0625  AST 22 54*  ALT 11 13  ALKPHOS 77 61  BILITOT 2.0* 1.6*  PROT 6.3 7.2  ALBUMIN 2.8* 2.8*   CBC:   Recent Labs Lab 10/01/12 0846 10/02/12 0510 10/03/12 0625  WBC 8.8 16.2* 14.5*  NEUTROABS 7.3  --   --   HGB 14.4 13.3 14.5  HCT 43.6 38.4* 44.7  MCV 87.2 86.7 87.3  PLT 210 PLATELET CLUMPS NOTED ON SMEAR, COUNT APPEARS ADEQUATE 206   Coagulation: No results found for this basename: LABPROT, INR,  in the last 168 hours Cardiac Enzymes:   Recent Labs Lab 10/01/12 0849  TROPONINI <0.30   Urinalysis:   Recent Labs Lab 10/01/12 0925  COLORURINE YELLOW  LABSPEC 1.015  PHURINE 7.0  GLUCOSEU NEGATIVE  HGBUR NEGATIVE  BILIRUBINUR NEGATIVE  KETONESUR NEGATIVE  PROTEINUR NEGATIVE  UROBILINOGEN 1.0  NITRITE NEGATIVE  LEUKOCYTESUR NEGATIVE   Lipid Panel    Component Value Date/Time   CHOL 92 10/02/2012 0820   HgbA1C  Lab Results  Component Value Date   HGBA1C 5.6 10/02/2012   LDL=26  Urine Drug Screen:   No results found for this basename: labopia,  cocainscrnur,  labbenz,  amphetmu,  thcu,  labbarb    Alcohol Level: No results found for this basename: ETH,  in the last 168 hours  CT of the brain  10/01/2012   1.  No acute intracranial abnormality  2.  Stable mild atrophy and chronic white matter ischemic changes 3.  Intracranial atherosclerosis                               10/02/2012 Cytotoxic edema and hypodensity in the vicinity of the inferior frontal gyrus and precentral gyrus on the right, compatible with subacute stroke. No hemorrhagic transformation observed  MRI of the brain    MRA of the brain    2D Echocardiogram  40-45%. No PFO or defect identified.  Carotid Doppler  Preliminary findings: Right = 40-59% ICA stenosis. Left = Borderline 60-79% ICA stenosis.  CXR  10/01/2012    Worsened aeration, especially on the right.  Suspicious for alveolar pulmonary edema versus multifocal infection.  Degraded exam, especially the lateral view.    EKG  sinus tachycardia.   Therapy Recommendations   Physical Exam   Obese elderly Caucasian male in mild respiratory distress.Awake alert. Afebrile. Head is nontraumatic. Neck is supple without bruit. Hearing is normal. Cardiac exam no murmur or gallop. Lungs are clear to auscultation. Distal pulses are well felt.1 + pedal edema bilaterally. Neurological Exam ; Awake alert oriented x 3 normal speech and language. Mild left lower face asymmetry. Tongue midline. Mild LUE drift.LUE 4/5 weakness Mild diminished fine finger movements on left. Orbits right over left upper extremity. Mild left grip weak.Marland KitchenLLE 4/5 weakness.Diminished left hemibody sensation . Normal coordination. Gait deferred.   ASSESSMENT Mr. Richard Chavez is a 79 y.o. male presenting with worsening fatigue, generalized weakness, left arm weakness, SOB, fever, and chills. Initial CT imaging negative; unable to have MRI due to pacer. Suspect right brain infarct based on symptoms. Infarct felt to be thrombotic secondary to small vessel disease.  On aspirin 325 mg orally every day prior to admission. Now on aspirin 325 mg orally every day for secondary stroke prevention. Patient with resultant left facial weakness, left hemiparesis. Work up  underway.  Hypertension\ Ctotoxic edema and hypodensity in the vicinity of the inferior frontal gyrus and precentral gyrus on the right, compatible with subacute stroke Hyperlipidemia, LDL pending, on lipitor 20 mg daily PTA, now on lipitor 20 mg daily, goal LDL < 100 (< 70 for diabetics) CAD - CABG 1978, pacemaker, MI OSA PVD Ischemic cardiomyopathy EF 35-40% COPD LDL 26 hgba1c 5.6 Right = 16-10% ICA stenosis. Left = Borderline 60-79% ICA stenosis.  Hospital day # 2  TREATMENT/PLAN  Change aspirin to clopidogrel  75 mg orally every day for secondary stroke prevention.  CIR Recommended  TCD to look at vasculature  Larita Fife D. Manson Passey, [facility], MBA, MHA [facility] Pager: [phone] 10/03/2012 9:32 AM  I have personally obtained a history, examined the patient, evaluated imaging results, and formulated the assessment and plan of care. I agree with the above.  Delia Heady, MD

## 2012-10-03 NOTE — Progress Notes (Signed)
SLP Cancellation Note  Patient Details Name: Richard Chavez MRN: [id number] DOB: December 12, 1926   Cancelled treatment:        Pt. Now NPO due to SBO with NGT.  Will follow briefly next week if/when return to po's   [employer] [phone]  10/03/2012

## 2012-10-03 NOTE — Progress Notes (Signed)
Pt remain NPO for SBO, pt passing gas, IV fluids ordered, NG tube ordered, pt still tachy in the 130's PA B. [name] aware, IV lopressor given as ordered, pt asymptomatic, will continue to monitor

## 2012-10-03 NOTE — Progress Notes (Signed)
Physical Therapy Treatment Patient Details Name: Richard Chavez MRN: [id number] DOB: [birth year] Today's Date: 10/03/2012 Time: 7829-5621 PT Time Calculation (min): 20 min  PT Assessment / Plan / Recommendation Comments on Treatment Session  Pt continues to c/o overall fatigue and limited overall mobility.  Pt with increase activation in left UE however continues to need cues for left UE protection.  Pt found to have SBO and now with NG tube insertion.    Follow Up Recommendations  CIR     Equipment Recommendations  None recommended by PT    Recommendations for Other Services OT consult  Frequency Min 4X/week   Plan Discharge plan remains appropriate;Frequency remains appropriate    Precautions / Restrictions Precautions Precautions: Fall Precaution Comments: L shoulder subluxation Restrictions Weight Bearing Restrictions: No   Pertinent Vitals/Pain No c/o pain     Mobility  Bed Mobility Bed Mobility: Supine to Sit;Sitting - Scoot to Edge of Bed Rolling Right: 3: Mod assist Right Sidelying to Sit: 3: Mod assist Details for Bed Mobility Assistance: (A) to elevate trunk OOB with cues for technique.  Cues to protect left UE as pt moves. Transfers Transfers: Sit to Stand;Stand to Sit Sit to Stand: 1: +2 Total assist;From bed;Without upper extremity assist Sit to Stand: Patient Percentage: 60% Stand to Sit: 1: +2 Total assist;To chair/3-in-1 Stand to Sit: Patient Percentage: 50% Stand Pivot Transfers: 1: +2 Total assist;From elevated surface Stand Pivot Transfers: Patient Percentage: 60% Details for Transfer Assistance: +2 (A) to initiate transfer and slowly descend to recliner with cues for overall hand and LE placement. Max cues for step sequence and advance hips during transfer. Ambulation/Gait Ambulation/Gait Assistance: 1: +2 Total assist Ambulation/Gait: Patient Percentage: 60% Ambulation Distance (Feet): 4 Feet (4 side steps) Assistive device: 2 person hand held  assist Ambulation/Gait Assistance Details: +2 (A) to maintain balance with cues for step sequence Gait Pattern: Step-through pattern;Decreased stride length;Decreased stance time - right Gait velocity: decreased    Exercises General Exercises - Lower Extremity Ankle Circles/Pumps: Strengthening;Both;10 reps;Seated Long Arc Quad: Strengthening;Both;10 reps;Seated Hip Flexion/Marching: Strengthening;Both;10 reps;Seated   PT Diagnosis:    PT Problem List:   PT Treatment Interventions:     PT Goals Acute Rehab PT Goals PT Goal Formulation: With patient Time For Goal Achievement: 10/14/12 Potential to Achieve Goals: Good Pt will go Supine/Side to Sit: with supervision PT Goal: Supine/Side to Sit - Progress: Progressing toward goal Pt will go Sit to Supine/Side: with supervision PT Goal: Sit to Supine/Side - Progress: Progressing toward goal Pt will go Sit to Stand: with min assist PT Goal: Sit to Stand - Progress: Progressing toward goal Pt will go Stand to Sit: with min assist PT Goal: Stand to Sit - Progress: Progressing toward goal Pt will Transfer Bed to Chair/Chair to Bed: with min assist PT Transfer Goal: Bed to Chair/Chair to Bed - Progress: Progressing toward goal Pt will Stand: with supervision;1 - 2 min PT Goal: Stand - Progress: Progressing toward goal Pt will Ambulate: 51 - 150 feet;with least restrictive assistive device;with min assist PT Goal: Ambulate - Progress: Progressing toward goal  Visit Information  Last PT Received On: 10/03/12 Assistance Needed: +2    Subjective Data  Subjective: "I'm so tired I'm not sure what I can do." Patient Stated Goal: To go home   Cognition  Cognition Arousal/Alertness: Awake/alert Behavior During Therapy: WFL for tasks assessed/performed Overall Cognitive Status: Impaired/Different from baseline Area of Impairment: Safety/judgement;Awareness;Problem solving Safety/Judgement: Decreased awareness of deficits Awareness:  Intellectual Problem  Solving: Slow processing;Difficulty sequencing    Balance  Balance Balance Assessed: Yes Static Sitting Balance Static Sitting - Balance Support: Feet supported Static Sitting - Level of Assistance: 5: Stand by assistance Static Standing Balance Static Standing - Balance Support: Bilateral upper extremity supported Static Standing - Level of Assistance: 1: +2 Total assist Static Standing - Comment/# of Minutes: ~3 minutes.  (A) to maintain balance with cues for upright posture  End of Session PT - End of Session Equipment Utilized During Treatment: Gait belt;Oxygen (2L, NG tube) Activity Tolerance: Patient limited by fatigue Patient left: in chair;with call bell/phone within reach Nurse Communication: Mobility status   GP     Richard Chavez 10/03/2012, 4:43 PM Richard Chavez, PT DPT [phone]

## 2012-10-03 NOTE — Progress Notes (Signed)
Pt sent to CT for Abd CT. While in CT pt HR up to 140, sustaining in 130s. CT called and stated PT was moving from bed. MD paged via amion of pt HR.  Pt HR remained from 120-130 and pt had 9 beats of Vtach.  2 RN went to CT to assess pt and transport back to floor. Pt complained of no chest pain, flutters, dizziness, no shortness of breath.  Upon arrival to floor pt BP checked, 154/66, HR still sustained in 130s with pt at rest. MD on call, Dr. Waymon Amato paged. Dr. Waymon Amato suggested RN call pt Cardiologist at [facility]. Dr. Herbie Baltimore. Dr. Herbie Baltimore paged and referred RN to Joesph Fillers PA to come assess pt. 2 EKG done, both stating "undertermined rhythm" sustaining in the 130s. BP 131/72 Rapid Response RN also paged to come assess pt. Joesph Fillers paged and came to assess pt. New order for 2.5mg  Lopressor IV. BP 133/86. Lopressor Given by Rapid Response RN. Baron Hamper, RN 10/03/2012

## 2012-10-03 NOTE — Progress Notes (Signed)
Pt states "I'm sick" pt unable to have bowel movement since 5/26. Abdomen distended with bowel sounds presents. Pt only takes colace currently. MD notified and new order for 5ml milk of magnesia for pt. Will continue to monitor and assess. Baron Hamper, RN

## 2012-10-03 NOTE — Progress Notes (Signed)
pts HR still in 120's - 130's, BP 88/64, IV lopressor held, PA [employer] notified, pt stable will continue to monitor

## 2012-10-03 NOTE — Progress Notes (Signed)
Noted Dr Riley Kill completed evaluation for CIR. Pt continues with multiple medical issues. Pt sleeping with no family present when I stopped by. Will f/u on Monday to evaluate medical status and to discuss possible CIR admission when medically stable. For questions: [phone]

## 2012-10-03 NOTE — Progress Notes (Signed)
Pt still stating "im sick". Pt complaining of abdominal cramping, pt uncomfortable and agitated. MD notified and new order for abd x-ray. Pt then complaining of sharp pain to center of abdomen. Pt vs 98.4 oral, 166/62, HR 70. MD notified and x-ray order changed to STAT. Will continue to monitor pt. Baron Hamper, RN 10/03/2012 3:45 AM

## 2012-10-03 NOTE — Progress Notes (Signed)
ANTIBIOTIC CONSULT NOTE - INITIAL  Pharmacy Consult for Vancomycin + Zosyn Indication: rule out pneumonia  No Known Allergies  Patient Measurements: Height: 6\' 2"  (188 cm) Weight: 231 lb 1.6 oz (104.826 kg) (bed scale; rn in room notified of weight change) IBW/kg (Calculated) : 82.2  Vital Signs: Temp: 98 F (36.7 C) (05/30 0534) Temp src: Oral (05/30 0534) BP: 133/86 mmHg (05/30 0814) Pulse Rate: 134 (05/30 0743) Intake/Output from previous day: 05/29 0701 - 05/30 0700 In: 690 [P.O.:90; IV Piggyback:600] Out: 2225 [Urine:2225] Intake/Output from this shift: Total I/O In: 0  Out: 200 [Urine:200]  Labs:  Recent Labs  10/01/12 0846 10/02/12 0510 10/02/12 0820 10/03/12 0625  WBC 8.8 16.2*  --  14.5*  HGB 14.4 13.3  --  14.5  PLT 210 PLATELET CLUMPS NOTED ON SMEAR, COUNT APPEARS ADEQUATE  --  206  CREATININE 1.42*  --  2.11* 1.99*   Estimated Creatinine Clearance: 36.3 ml/min (by C-G formula based on Cr of 1.99). No results found for this basename: VANCOTROUGH, Leodis Binet, VANCORANDOM, GENTTROUGH, GENTPEAK, GENTRANDOM, TOBRATROUGH, TOBRAPEAK, TOBRARND, AMIKACINPEAK, AMIKACINTROU, AMIKACIN,  in the last 72 hours   Microbiology: Recent Results (from the past 720 hour(s))  CULTURE, BLOOD (ROUTINE X 2)     Status: None   Collection Time    09/15/12  1:40 PM      Result Value Range Status   Specimen Description BLOOD LEFT ANTECUBITAL   Final   Special Requests BOTTLES DRAWN AEROBIC AND ANAEROBIC 3CC   Final   Culture  Setup Time 09/15/2012 22:38   Final   Culture NO GROWTH 5 DAYS   Final   Report Status 09/21/2012 FINAL   Final  CULTURE, BLOOD (ROUTINE X 2)     Status: None   Collection Time    09/15/12  1:45 PM      Result Value Range Status   Specimen Description BLOOD LEFT FOREARM   Final   Special Requests BOTTLES DRAWN AEROBIC ONLY 5CC   Final   Culture  Setup Time 09/15/2012 22:38   Final   Culture NO GROWTH 5 DAYS   Final   Report Status 09/21/2012 FINAL    Final  URINE CULTURE     Status: None   Collection Time    09/15/12  2:42 PM      Result Value Range Status   Specimen Description URINE, CATHETERIZED   Final   Special Requests NONE   Final   Culture  Setup Time 09/15/2012 23:04   Final   Colony Count NO GROWTH   Final   Culture NO GROWTH   Final   Report Status 09/16/2012 FINAL   Final  MRSA PCR SCREENING     Status: None   Collection Time    09/16/12  8:02 AM      Result Value Range Status   MRSA by PCR NEGATIVE  NEGATIVE Final   Comment:            The GeneXpert MRSA Assay (FDA     approved for NASAL specimens     only), is one component of a     comprehensive MRSA colonization     surveillance program. It is not     intended to diagnose MRSA     infection nor to guide or     monitor treatment for     MRSA infections.  CULTURE, EXPECTORATED SPUTUM-ASSESSMENT     Status: None   Collection Time    09/16/12 10:08 AM  Result Value Range Status   Specimen Description SPUTUM   Final   Special Requests NONE   Final   Sputum evaluation     Final   Value: THIS SPECIMEN IS ACCEPTABLE. RESPIRATORY CULTURE REPORT TO FOLLOW.   Report Status 09/16/2012 FINAL   Final  CULTURE, RESPIRATORY (NON-EXPECTORATED)     Status: None   Collection Time    09/16/12 10:08 AM      Result Value Range Status   Specimen Description SPUTUM   Final   Special Requests NONE   Final   Gram Stain     Final   Value: NO WBC SEEN     ABUNDANT SQUAMOUS EPITHELIAL CELLS PRESENT     FEW YEAST   Culture     Final   Value: FEW CANDIDA ALBICANS     Note: MICROSCOPIC FINDINGS SUGGEST THAT THIS SPECIMEN IS NOT REPRESENTATIVE OF LOWER RESPIRATORY SECRETIONS. PLEASE RECOLLECT.   Report Status 09/19/2012 FINAL   Final  CULTURE, BLOOD (ROUTINE X 2)     Status: None   Collection Time    10/01/12  1:49 PM      Result Value Range Status   Specimen Description BLOOD RIGHT FOREARM   Final   Special Requests BOTTLES DRAWN AEROBIC ONLY 6CC   Final   Culture   Setup Time 10/01/2012 17:12   Final   Culture     Final   Value:        BLOOD CULTURE RECEIVED NO GROWTH TO DATE CULTURE WILL BE HELD FOR 5 DAYS BEFORE ISSUING A FINAL NEGATIVE REPORT   Report Status PENDING   Incomplete  CULTURE, BLOOD (ROUTINE X 2)     Status: None   Collection Time    10/01/12  1:55 PM      Result Value Range Status   Specimen Description BLOOD THUMB RIGHT   Final   Special Requests BOTTLES DRAWN AEROBIC ONLY 5CC   Final   Culture  Setup Time 10/01/2012 17:12   Final   Culture     Final   Value:        BLOOD CULTURE RECEIVED NO GROWTH TO DATE CULTURE WILL BE HELD FOR 5 DAYS BEFORE ISSUING A FINAL NEGATIVE REPORT   Report Status PENDING   Incomplete  MRSA PCR SCREENING     Status: None   Collection Time    10/01/12  4:29 PM      Result Value Range Status   MRSA by PCR NEGATIVE  NEGATIVE Final   Comment:            The GeneXpert MRSA Assay (FDA     approved for NASAL specimens     only), is one component of a     comprehensive MRSA colonization     surveillance program. It is not     intended to diagnose MRSA     infection nor to guide or     monitor treatment for     MRSA infections.    Medical History: Past Medical History  Diagnosis Date  . CAD (coronary artery disease), native coronary artery     Status post CABG  . Hypertension   . Hyperlipidemia   . GERD (gastroesophageal reflux disease)   . COPD (chronic obstructive pulmonary disease)   . CRF (chronic renal failure)   . Barrett's esophagus   . OP (osteoporosis)   . CAD (coronary artery disease) of artery bypass graft 07/04/2011    LIMA-LAD; SVG-OM2-OM3, SVG-RCA -- all patent  as of 05/27/11  . Syncope 07/04/2011  . Pacemaker 07/04/2011  . Cardiomyopathy, ischemic 07/07/2011    2D Echo - EF 35-40, moderately dilated left atrium  . Myocardial infarction     25 yrs ago  . Peripheral vascular disease   . Arthritis   . BPH (benign prostatic hyperplasia)   . Polyp of colon     removed  . Sleep apnea      STOP BANG SCORE 4  . AV block, 3rd degree     Post St. Jude pacemaker, EF 35-40, does have intermittent atrial tachycardia, either PAt or Afib  . BPH (benign prostatic hypertrophy) 11/15/2011  . SOB (shortness of breath) on exertion 02/14/2010    2D Echo - EF 35-45,     Assessment: 79 y.o. M recently hospitalized for treatment of CAP from 5/12 to 5/14 who re-presented to the [facility] on 5/28 with SOB/weakness. Pharmacy was consulted to start Vancomycin + Zosyn for HCAP coverage (since patient recently admitted).  Last admission the patient was treated with Rocephin + Azithromycin x 3 days then transitioned to Levaquin upon discharge to complete a total of 7 days worth of antibiotics. Scr has been elevated the last few days. Will adjust vanc dose.  Goal of Therapy:  Vancomycin trough level 15-20 mcg/ml Proper antibiotics for infection/cultures adjusted for renal/hepatic function   Plan:   Change vanc to 1g IV q24 Cont zosyn 3.375g IV q8

## 2012-10-04 ENCOUNTER — Inpatient Hospital Stay (HOSPITAL_COMMUNITY): Payer: Medicare Other

## 2012-10-04 DIAGNOSIS — Z7189 Other specified counseling: Secondary | ICD-10-CM

## 2012-10-04 LAB — BASIC METABOLIC PANEL
Calcium: 9.1 mg/dL (ref 8.4–10.5)
GFR calc non Af Amer: 41 mL/min — ABNORMAL LOW (ref >90)
Potassium: 3.8 mEq/L (ref 3.5–5.1)
Sodium: 139 mEq/L (ref 135–145)

## 2012-10-04 LAB — CBC
Hemoglobin: 14.1 g/dL (ref 13.0–17.0)
Platelets: 232 10*3/uL (ref 150–400)
RBC: 4.86 MIL/uL (ref 4.22–5.81)
WBC: 10.9 10*3/uL — ABNORMAL HIGH (ref 4.0–10.5)

## 2012-10-04 MED ORDER — METOPROLOL TARTRATE 1 MG/ML IV SOLN
7.5000 mg | Freq: Four times a day (QID) | INTRAVENOUS | Status: AC
  Administered 2012-10-04 – 2012-10-06 (×9): 7.5 mg via INTRAVENOUS
  Filled 2012-10-04 (×10): qty 10

## 2012-10-04 MED ORDER — SODIUM CHLORIDE 0.9 % IV SOLN
INTRAVENOUS | Status: DC
  Administered 2012-10-04: 18:00:00 via INTRAVENOUS

## 2012-10-04 NOTE — Progress Notes (Addendum)
Subjective:  Just feels bad! Belly hurts, arm hurts & hard to breath.  He is not sure of how "good a job is being done" -- inpatient with slow progression.  Objective:  Vital Signs in the last 24 hours: Temp:  [98.1 F (36.7 C)-98.6 F (37 C)] 98.6 F (37 C) (05/31 0502) Pulse Rate:  [55-140] 140 (05/31 0502) Resp:  [20] 20 (05/31 0502) BP: (88-151)/(53-83) 151/83 mmHg (05/31 0502) SpO2:  [95 %-97 %] 95 % (05/31 0502) Weight:  [227 lb 14.4 oz (103.375 kg)] 227 lb 14.4 oz (103.375 kg) (05/31 0502)  Intake/Output from previous day: 05/30 0701 - 05/31 0700 In: 0  Out: 1775 [Urine:1325; Emesis/NG output:450] Intake/Output from this shift:   Physical Exam: General appearance: alert, cooperative, appears stated age, mild distress and moderately obese Neck: no JVD and supple, symmetrical, trachea midline Lungs: diminished breath sounds bibasilar, LLL and RLL, rales bibasilar, rhonchi bibasilar and LLL and mostly non-labored. Heart: irregularly irregular rhythm and tachycardic, no obvious murmum Abdomen: abnormal findings:  distended, guarding, hypoactive bowel sounds, moderate tenderness in the entire abdomen and high pitched Bowel sounds consistent with SBO Extremities: edema minimal; diffuse ecchymoses on arms - R arm skin tear dressed. Pulses: 2+ and symmetric mildly diminished due to rate Neurologic: Mental status: Alert, oriented, thought content appropriate, affect: normal Sensory: reduced tactile sense Left arm Motor: 2/5 strength moving L arm, but remains unable to grasp hand or move below the wrist, is moving L leg some. somewhat improved from 2 d ago  Lab Results:  Recent Labs  10/03/12 0625 10/04/12 0530  WBC 14.5* 10.9*  HGB 14.5 14.1  PLT 206 232    Recent Labs  10/03/12 0625 10/04/12 0530  NA 133* 139  K 5.4* 3.8  CL 93* 100  CO2 26 24  GLUCOSE 99 128*  BUN 27* 30*  CREATININE 1.99* 1.52*   No results found for this basename: TROPONINI, CK, MB,  in the  last 72 hours Hepatic Function Panel  Recent Labs  10/03/12 0625  PROT 7.2  ALBUMIN 2.8*  AST 54*  ALT 13  ALKPHOS 61  BILITOT 1.6*    Recent Labs  10/02/12 0820  CHOL 92   No results found for this basename: PROTIME,  in the last 72 hours  Imaging: KUB - persistent SBO, no Significant change  Cardiac Studies: Echo reviewed, EF actually improved from previous. 40% to 45% Pacer interrogation (per Dr.Croitor):  new onset lengthy episodes of atrial fibrillation on May 15 and again on May 22, raising possibility of cardioembolic stroke.   Assessment/Plan:  Principal Problem:   Stroke, lacunar Active Problems:   NSVT    CAD CABG 1998, patent grafts 06/06/11 after abn Myoview   Cardiomyopathy, ischemic, EF 30-35% by recent cath   Chronic combined systolic and diastolic congestive heart failure   OSA (obstructive sleep apnea)   Acute on chronic combined systolic and diastolic congestive heart failure, NYHA class 3   Possible reccuring Aspiration pneumonia, worsened by potential CVA   Atrial fibrillation with RVR   Small bowel obstruction   Hypertension   COPD (chronic obstructive pulmonary disease)   Pacemaker, St Jude, implanted 2008   CKD (chronic kidney disease) stage 2, GFR 60-89 ml/min   Chronic kidney disease, stage III (moderate)   Renal failure, acute on chronic   Hyperkalemia  Went into Afib RVR yesterday AM in setting of SBO => suspect that RVR and NSVT are expressions of beta blocker rebound & discomfort (cannot  discount potential of dehydration) and he will need IV beta blockers until bowel problems resolve.  Will increase BB dose to 7.5 bid  May consider using Diltiazem gtt for additional rate control - since his EF is not as low as previously documented.  No active HF Sx & with NGT output, no need for diuresis; in fact, he may need gentle fluid resuscitation while NPO & NGT -- electrolytes appear to be stable & renal Fxn improving.  Neurology has  recommended converting ASA to Plavix for CVA prevention - but with clear source of potential cardioembolic event from Afib (that as far as I know was the 1st true Afib documentation on him from [facility]), ? When we would switch to full anticoagulation, i.e. Warfarin or NOAC (of course not in setting of SBO) +/- heparin bridge as he continues in & out of Afib - Sinus Tachy.    Would not feel comfortable, unless he destabilizes using Amiodarone for rate / rhythm control in the absence of full anticoagulation  Will need Neurology opinion on the timing of initiating anticoagulation.  No active CHF.  Would recommedn PICC line for better & more stable IV access as he is likely to remain NPO for a while & has poor peripheral IV access. We will continue to follow along.  I spent 20 min in the room with the pt & 10 min on phone with his son, 10-15 min in chart review & annotation. Multiple conditions converging, requiring extensive medical decision making with a combined team effort.   LOS: 3 days    Lenis Nettleton W 10/04/2012, 9:01 AM

## 2012-10-04 NOTE — Clinical Social Work Placement (Addendum)
Clinical Social Work Department CLINICAL SOCIAL WORK PLACEMENT NOTE 10/04/2012  Patient:  Richard Chavez, Richard Chavez  Account Number:  [id number] Admit date:  10/01/2012  Clinical Social Worker:  Genelle Bal, LCSW  Date/time:  10/04/2012 03:02 AM  Clinical Social Work is seeking post-discharge placement for this patient at the following level of care:   SKILLED NURSING   (*CSW will update this form in Epic as items are completed)   10/01/2012  Patient/family provided with [facility] Department of Clinical Social Work's list of facilities offering this level of care within the geographic area requested by the patient (or if unable, by the patient's family). Given another SNF list on 10/04/12.  10/04/2012  Patient/family informed of their freedom to choose among providers that offer the needed level of care, that participate in Medicare, Medicaid or managed care program needed by the patient, have an available bed and are willing to accept the patient.    Patient/family informed of [facility]' ownership interest in [facility], as well as of the fact that they are under no obligation to receive care at this facility.  PASARR submitted to EDS on 10/04/2012 PASARR number received from EDS on 10/04/2012 - [phone] [id number]  FL2 transmitted to all facilities in geographic area requested by pt/family on  10/04/2012 FL2 transmitted to all facilities within larger geographic area on   Patient informed that his/her managed care company has contracts with or will negotiate with  certain facilities, including the following:     Patient/family informed of bed offers received:   Patient chooses bed at  Physician recommends and patient chooses bed at    Patient to be transferred to  on   Patient to be transferred to facility by   The following physician request were entered in Epic:   Additional Comments:

## 2012-10-04 NOTE — Clinical Social Work Note (Signed)
CSW talked with patient about discharge plans and the possibility of going to inpatient rehab after being discharged from hospital. CSW also talked with patient about going to a skilled facility for short-term rehab if he is not able to d/c to CIR. Patient initially talked about going home and that he and son, Richard Chavez live together and son helps him out. After further explanation and discussion, patient agreed to ST rehab at a skilled facility as a second option and he was given a skilled facility list for [facility] that will assist he and son in making a decision on a SNF for rehab.  Genelle Bal, MSW, LCSW [phone]

## 2012-10-04 NOTE — Progress Notes (Signed)
Patient ID: Richard Chavez, male   DOB: 08-25-1926, 79 y.o.   MRN: [id number]    Subjective: Pt reports feeling worse today,had nausea and vomiting yesterday, had some gas yesterday but not today, more abd pain today  Objective: Vital signs in last 24 hours: Temp:  [98.1 F (36.7 C)-98.6 F (37 C)] 98.6 F (37 C) (05/31 0502) Pulse Rate:  [55-140] 140 (05/31 0502) Resp:  [20] 20 (05/31 0502) BP: (88-151)/(53-86) 151/83 mmHg (05/31 0502) SpO2:  [95 %-97 %] 95 % (05/31 0502) Weight:  [227 lb 14.4 oz (103.375 kg)] 227 lb 14.4 oz (103.375 kg) (05/31 0502) Last BM Date: 09/29/12  Intake/Output from previous day: 05/30 0701 - 05/31 0700 In: 0  Out: 1775 [Urine:1325; Emesis/NG output:450] Intake/Output this shift:    PE: Abd: distended, tender but no peritonitis, no bs, tympanic General: nad NGT with bilious output  Lab Results:   Recent Labs  10/03/12 0625 10/04/12 0530  WBC 14.5* 10.9*  HGB 14.5 14.1  HCT 44.7 41.5  PLT 206 232   BMET  Recent Labs  10/03/12 0625 10/04/12 0530  NA 133* 139  K 5.4* 3.8  CL 93* 100  CO2 26 24  GLUCOSE 99 128*  BUN 27* 30*  CREATININE 1.99* 1.52*  CALCIUM 9.2 9.1   PT/INR No results found for this basename: LABPROT, INR,  in the last 72 hours CMP     Component Value Date/Time   NA 139 10/04/2012 0530   K 3.8 10/04/2012 0530   CL 100 10/04/2012 0530   CO2 24 10/04/2012 0530   GLUCOSE 128* 10/04/2012 0530   BUN 30* 10/04/2012 0530   CREATININE 1.52* 10/04/2012 0530   CALCIUM 9.1 10/04/2012 0530   PROT 7.2 10/03/2012 0625   ALBUMIN 2.8* 10/03/2012 0625   AST 54* 10/03/2012 0625   ALT 13 10/03/2012 0625   ALKPHOS 61 10/03/2012 0625   BILITOT 1.6* 10/03/2012 0625   GFRNONAA 41* 10/04/2012 0530   GFRAA 47* 10/04/2012 0530   Lipase     Component Value Date/Time   LIPASE 22 06/30/2011 1842       Studies/Results: Ct Abdomen Pelvis Wo Contrast  10/03/2012   *RADIOLOGY REPORT*  Clinical Data: Mid abdominal pain  CT ABDOMEN AND PELVIS  WITHOUT CONTRAST  Technique:  Multidetector CT imaging of the abdomen and pelvis was performed following the standard protocol without intravenous contrast.  Comparison: 06/30/2011  Findings: Calcified pleural plaques at the right lung base are stable.  Fibrotic changes at the lung bases are stable.  Ill- defined opacities in the right middle and lower lobe visualized on this study are present.  These are characterized by a focal rounded nodular areas with surrounding ground-glass and interstitial changes.  Several areas are present.  The largest is 2.0 cm in the right middle lobe on image #1.  Coronary artery calcifications.  Pacemaker device is present.  Post cholecystectomy.  Liver, spleen, pancreas, adrenal glands are within normal limits.  Atherosclerotic changes of the aorta are present.  Maximal AP diameter of the aorta is 3.7 cm.  This is not significantly changed.  Non-visualized appendix.  Sigmoid diverticulosis is present without evidence of acute diverticulitis.  Proximal small bowel is dilated with air-fluid levels.  Distal small bowel is completely decompressed.  Colon is decompressed. The focal transition point is not clearly visualized.  The transition point probably occurs in the proximal or mid ileum within the right side of the abdomen.  Foley catheter decompresses the bladder.  Stable prostate gland.  Degenerative disc disease at L4-5 and L5-S1.  Simple cysts in the left kidney are stable.  IMPRESSION: Small bowel obstruction pattern as described.  The exact transition point is not clearly evident in this may be due to adhesions.  There are several ill-defined opacities in the right middle and lower lobes.  The finding is nonspecific and likely represents an inflammatory process.  Malignancy is not entirely excluded.  Short- term follow-up studies are recommended to ensure resolution of these findings.  If there is strong concern for malignancy, PET CT can be performed.  Stable chronic findings.    Original Report Authenticated By: Jolaine Click, M.D.   Ct Head Wo Contrast  10/02/2012   *RADIOLOGY REPORT*  Clinical Data: Left arm weakness, fatigue, and headache.  CT HEAD WITHOUT CONTRAST  Technique:  Contiguous axial images were obtained from the base of the skull through the vertex without contrast.  Comparison: 10/01/2012  Findings: Despite efforts by the patient and technologist, motion artifact is present on some series of today's examination and could not be totally eliminated.  This reduces diagnostic sensitivity and specificity.  Cytotoxic edema and hypodensity in a band in the right frontal lobe on images 22-25 of series 2.  Chronic microvascular white matter disease noted.  No intracranial hemorrhage is observed.  Basal ganglia, brain stem, and cerebellum intact.  Atherosclerotic calcification of the carotid siphons noted.  IMPRESSION:  1.  Cytotoxic edema and hypodensity in the vicinity of the inferior frontal gyrus and precentral gyrus on the right, compatible with subacute stroke.  No hemorrhagic transformation observed.   Original Report Authenticated By: Gaylyn Rong, M.D.   Dg Abd Portable 2v  10/03/2012   *RADIOLOGY REPORT*  Clinical Data: Abdominal pain and vomiting.  PORTABLE ABDOMEN - 2 VIEW  Comparison: CT of the abdomen and pelvis, and abdominal radiograph, performed 06/30/2011  Findings: There is dilatation of small bowel loops to 4.5 cm in maximal diameter, with distension of the stomach.  Associated air- fluid levels are seen.  Findings are concerning for small bowel obstruction, though residual stool is seen within the sigmoid colon.  No free intra-abdominal air is identified on the provided decubitus view.  The visualized lung bases are grossly clear.  The patient is status post median sternotomy.  No acute osseous abnormalities are seen.  IMPRESSION: Dilatation of small bowel loops to 4.5 cm in maximal diameter, with associated distension of the stomach.  Air-fluid levels  noted. Findings concerning for small bowel obstruction; no free intra- abdominal air seen.  These results were called by telephone on 10/03/2012 at 05:04 a.m. to [facility] on [id number], who verbally acknowledged these results.   Original Report Authenticated By: Tonia Ghent, M.D.    Anti-infectives: Anti-infectives   Start     Dose/Rate Route Frequency Ordered Stop   10/03/12 2300  vancomycin (VANCOCIN) IVPB 1000 mg/200 mL premix     1,000 mg 200 mL/hr over 60 Minutes Intravenous Every 24 hours 10/03/12 1030     10/02/12 1500  vancomycin (VANCOCIN) 1,500 mg in sodium chloride 0.9 % 500 mL IVPB  Status:  Discontinued     1,500 mg 250 mL/hr over 120 Minutes Intravenous Every 24 hours 10/01/12 1245 10/03/12 1030   10/01/12 1330  vancomycin (VANCOCIN) 2,000 mg in sodium chloride 0.9 % 500 mL IVPB     2,000 mg 250 mL/hr over 120 Minutes Intravenous  Once 10/01/12 1244 10/01/12 1636   10/01/12 1300  piperacillin-tazobactam (ZOSYN) IVPB 3.375 g  3.375 g 12.5 mL/hr over 240 Minutes Intravenous 3 times per day 10/01/12 1245         Assessment/Plan SBO: more symptomatic today, NGT with bilious output, awaiting AM films, will keep NPO for now, keep NGT to suction, pending films will make further recs.   LOS: 3 days    WHITE, ELIZABETH 10/04/2012 2

## 2012-10-04 NOTE — Progress Notes (Signed)
TCD completed. 

## 2012-10-04 NOTE — Progress Notes (Signed)
Stroke Team Progress Note  HISTORY Richard Chavez Chavez an 79 y.o. male white male with PMH of CAD s/p CABG, CHF EF 30-35%, s/p PPM, HTN, and COPD presenting to the ED today 10/01/2012 alongside his son with complaints of worsening fatigue, generalized weakness, left arm weakness, SOB, fever, and chills. History Chavez mainly obtained by son, Richard Chavez, who explains that his father started complaining of left hand numbness yesterday morning (09/30/2012) ~11am. They went to the PCP office and returned home later that day thought to have numbness secondary to neuropathy. He was still moving his arm and able to drive and walk at that time, however, upon waking up this 10/02/28, he was confused, Chavez moving his left arm at all, and had difficulty walking. Richard Chavez have any similar prior episodes or any history of CVA. His son does claim he takes an ASA daily, however, it Chavez Chavez on his medication list on epic and did Chavez get any ASA this morning. Of note, he was recently discharged from the hospital on 09/17/12 for CAP on levofloxacin course.   Patient was Chavez a TPA candidate secondary to delay in arrival. He was admitted for further evaluation and treatment.  SUBJECTIVE He has SBO, currently NPO with paraxysmal AFib.   OBJECTIVE Most recent Vital Signs: Filed Vitals:   10/03/12 2025 10/03/12 2125 10/04/12 0502 10/04/12 0909  BP:  125/79 151/83   Pulse:  112 140   Temp:  98.1 F (36.7 C) 98.6 F (37 C)   TempSrc:  Oral Oral   Resp:  20 20   Height:      Weight:   103.375 kg (227 lb 14.4 oz)   SpO2: 97% 97% 95% 92%   CBG (last 3)  No results found for this basename: GLUCAP,  in the last 72 hours  IV Fluid Intake:     MEDICATIONS  . aspirin  300 mg Rectal Daily  . atorvastatin  20 mg Oral Q breakfast  . docusate sodium  100 mg Oral Daily  . enoxaparin (LOVENOX) injection  40 mg Subcutaneous Q24H  . gabapentin  600 mg Oral TID  . guaiFENesin  1,200 mg Oral BID  . hydrocortisone sod succinate  (SOLU-CORTEF) inj  25 mg Intravenous Q12H  . Ipratropium-Albuterol  2 puff Inhalation BID  . loratadine  10 mg Oral Q breakfast  . magnesium chloride  1 tablet Oral BID  . metoprolol  7.5 mg Intravenous Q6H  . piperacillin-tazobactam (ZOSYN)  IV  3.375 g Intravenous Q8H  . vancomycin  1,000 mg Intravenous Q24H  . Vitamin D (Ergocalciferol)  50,000 Units Oral Q7 days   PRN:    Diet:  NPO Activity:  Up as tolerated DVT Prophylaxis:  SCDs   CLINICALLY SIGNIFICANT STUDIES Basic Metabolic Panel:   Recent Labs Lab 10/03/12 0625 10/04/12 0530  NA 133* 139  K 5.4* 3.8  CL 93* 100  CO2 26 24  GLUCOSE 99 128*  BUN 27* 30*  CREATININE 1.99* 1.52*  CALCIUM 9.2 9.1  MG 2.5  --    Liver Function Tests:   Recent Labs Lab 10/02/12 0820 10/03/12 0625  AST 22 54*  ALT 11 13  ALKPHOS 77 61  BILITOT 2.0* 1.6*  PROT 6.3 7.2  ALBUMIN 2.8* 2.8*   CBC:   Recent Labs Lab 10/01/12 0846  10/03/12 0625 10/04/12 0530  WBC 8.8  < > 14.5* 10.9*  NEUTROABS 7.3  --   --   --   HGB  14.4  < > 14.5 14.1  HCT 43.6  < > 44.7 41.5  MCV 87.2  < > 87.3 85.4  PLT 210  < > 206 232  < > = values in this interval Chavez displayed. Coagulation: No results found for this basename: LABPROT, INR,  in the last 168 hours Cardiac Enzymes:   Recent Labs Lab 10/01/12 0849  TROPONINI <0.30   Urinalysis:   Recent Labs Lab 10/01/12 0925  COLORURINE YELLOW  LABSPEC 1.015  PHURINE 7.0  GLUCOSEU NEGATIVE  HGBUR NEGATIVE  BILIRUBINUR NEGATIVE  KETONESUR NEGATIVE  PROTEINUR NEGATIVE  UROBILINOGEN 1.0  NITRITE NEGATIVE  LEUKOCYTESUR NEGATIVE   Lipid Panel    Component Value Date/Time   CHOL 92 10/02/2012 0820   HgbA1C  Lab Results  Component Value Date   HGBA1C 5.6 10/02/2012   LDL=26  Urine Drug Screen:   No results found for this basename: labopia,  cocainscrnur,  labbenz,  amphetmu,  thcu,  labbarb    Alcohol Level: No results found for this basename: ETH,  in the last 168  hours  CT of the brain  10/01/2012   1.  No acute intracranial abnormality 2.  Stable mild atrophy and chronic white matter ischemic changes 3.  Intracranial atherosclerosis                               10/02/2012 Cytotoxic edema and hypodensity in the vicinity of the inferior frontal gyrus and precentral gyrus on the right, compatible with subacute stroke. No hemorrhagic transformation observed  MRI of the brain    MRA of the brain    2D Echocardiogram  40-45%. No PFO or defect identified.  Carotid Doppler  Preliminary findings: Right = 40-59% ICA stenosis. Left = Borderline 60-79% ICA stenosis.  CXR  10/01/2012    Worsened aeration, especially on the right.  Suspicious for alveolar pulmonary edema versus multifocal infection.  Degraded exam, especially the lateral view.    EKG  sinus tachycardia.   Therapy Recommendations   Physical Exam   Obese elderly Caucasian male in mild respiratory distress.Awake alert. Afebrile. Head Chavez nontraumatic. Neck Chavez supple without bruit. Hearing Chavez normal. Cardiac exam no murmur or gallop. Lungs are clear to auscultation. Distal pulses are well felt.1 + pedal edema bilaterally. Neurological Exam ; Awake alert oriented x 3 normal speech and language. Mild left lower face asymmetry. Tongue midline. Mild LUE drift.LUE 4/5 weakness Mild diminished fine finger movements on left. Orbits right over left upper extremity. Mild left grip weak.Marland KitchenLLE 4/5 weakness.Diminished left hemibody sensation . Normal coordination. Gait deferred.   ASSESSMENT Richard Chavez Chavez a 79 y.o. male presenting with worsening fatigue, generalized weakness, left arm weakness, SOB, fever, and chills. Initial CT imaging negative; unable to have MRI due to pacer. Suspect right brain infarct based on symptoms. Infarct felt to be thrombotic secondary to small vessel disease.  On aspirin 325 mg orally every day prior to admission. Now on aspirin 325 mg orally every day for secondary stroke  prevention. Patient with resultant left facial weakness, left hemiparesis. Work up underway.  Has SBO, currently NPO, paroxysmal A-fib with RVR.   Hypertension\ Ctotoxic edema and hypodensity in the vicinity of the inferior frontal gyrus and precentral gyrus on the right, compatible with subacute stroke Hyperlipidemia, LDL pending, on lipitor 20 mg daily PTA, now on lipitor 20 mg daily, goal LDL < 100 (< 70 for diabetics) CAD - CABG  1978, pacemaker, [place] OSA PVD Ischemic cardiomyopathy EF 35-40% COPD LDL 26 hgba1c 5.6 Right = 30-86% ICA stenosis. Left = Borderline 60-79% ICA stenosis.  Hospital day # 3  TREATMENT/PLAN  Change plavix to rectal ASA as NPO for SBO  A-fib with RVR that Chavez paroxysmal. Wait at least 7-14 days prior to anticoagulation due to stroke.   CIR Recommended in the future.   TCD to look at vasculature   10/04/2012 10:12 AM Pauletta Browns

## 2012-10-04 NOTE — Procedures (Signed)
R IJ DL CVC placed No complication No blood loss. See complete dictation in [facility].

## 2012-10-04 NOTE — Progress Notes (Signed)
ATTENDING ADDENDUM:  I personally reviewed patient's record, examined the patient, and formulated the following assessment and plan:  Pt feels worse.  AXR about the same.  Abd distended with peritonitis.  Will advance NG to help with drainage.  Ambulate if possible.

## 2012-10-04 NOTE — Progress Notes (Signed)
[facility] PROGRESS NOTE  Richard Chavez ZOX:[id number] DOB: Jun 07, 1926 DOA: 10/01/2012 PCP: Thayer Headings, MD  Brief narrative 79 year old male with extensive past medical history including CAD, CABG, ischemic cardiomyopathy, EF 35-40% 3/13, PPM, chronic combined systolic/diastolic CHF HTN, HL, GERD, COPD, chronic kidney disease, recent hospitalization 5/12-5/14 for community-acquired pneumonia presented on 5/28 with complaints of numbness in left hand, chills, weakness, mild productive sputum, fevers, mild SOB and leg edema. In the ED, chest x-ray suggestive of pulmonary edema, ABG showed pH of of 72%, creatinine 1.42, proBNP 1059. Hospitalist admission requested.    Assessment/Plan: 1. Subacute right brain infarct: Initial CT imaging was negative but subsequent one showed subacute stroke. Unable to do MRI secondary to pacer. Was switched to oral Plavix from aspirin. However due to current n.p.o. status for SBO-will be changed back to rectal aspirin. His CVA could be embolic from A. fib-neurology does not recommend IV heparin at this time but may eventually need anticoagulation. Statin will be held secondary to n.p.o. Complete stroke workup. 2. Small bowel obstruction: Unclear etiology. N.p.o. Surgery input appreciated. Patient had 3 BMs since this morning. Abdominal distention apparently better. Monitor. 3. Acute on on chronic kidney disease stage III: Likely precipitated by diuresis. Hold Lasix. Treat with IV fluids and follow BMP. Improving. 4. Hyperkalemia: Secondary to acute renal failure and potassium supplements. Hold potassium supplements. IV fluids and resolved. 5. A. fib with RVR and NSVT: Management per cardiology. Beta blocker changed to IV metoprolol. Cardiology recommends long-term anticoagulation. Management per cardiology-? Cardizem drip 6. Shortness of breath-multifactorial-potentially aspiration pneumonia +/-CHF exacerbation-patient's dry weight is 233 pounds. Continue daily  weights, strict in and out. Clinically dry and patient denies dyspnea. Brief IV fluids and hold Lasix.  3. Potential aspiration pneumonia : Agree with empiric Vancomycin/Zosyn for now. CT abdomen showed right middle lobe and lower lobe opacities. Will need followup imaging to ensure resolution. Will get speech therapist input to rule out overt oropharyngeal dysphagia. Oxygen and nebs to continue as needed. 4. History of CAD/cardiomyopathy/CHF NYHA class II- cardiology input appreciated. 5. COPD history-continue Combivent inhaler 2 puff twice a day. Continue home dose prednisone 5 mg tablet-changed to IV hydrocortisone secondary to n.p.o.. Continue tiotropium 18 mcg daily every morning. COPD appears to be at baseline. No wheezing. 6. Hypotension-seems like a chronic issue.  resolved.  7. Hyperlipidemia continue Lipitor 20 mg daily with breakfast. Lipid panel stable. 8. Confusion: Possibly from acute illness.   Code Status: Full Family Communication: Discussed with patient's son Mr. Erasmus Bistline. Disposition Plan: Remains inpatient.   Consultants:  Cardiology  Neurology  Rehab MD  General surgery  Procedures:  None  Antibiotics:  Vancomycin  Zosyn   HPI/Subjective:  Patient appeared confused this morning. Complains of abdominal pain. On and off dyspnea.  Objective: Filed Vitals:   10/03/12 1810 10/03/12 2025 10/03/12 2125 10/04/12 0502  BP: 88/64  125/79 151/83  Pulse:   112 140  Temp:   98.1 F (36.7 C) 98.6 F (37 C)  TempSrc:   Oral Oral  Resp:   20 20  Height:      Weight:    103.375 kg (227 lb 14.4 oz)  SpO2:  97% 97% 95%    Intake/Output Summary (Last 24 hours) at 10/04/12 0724 Last data filed at 10/04/12 0515  Gross per 24 hour  Intake      0 ml  Output   1775 ml  Net  -1775 ml   Filed Weights   10/02/12 0700 10/03/12 0534 10/04/12  0502  Weight: 109.6 kg (241 lb 10 oz) 104.826 kg (231 lb 1.6 oz) 103.375 kg (227 lb 14.4 oz)    Exam:   General  exam: Comfortable. Mucosa dry but better.  Respiratory system:  reduced breath sounds in the bases but otherwise clear to auscultation . No increased work of breathing. Midline sternotomy scar.  Cardiovascular system: S1 & S2 heard,  irregular . No JVD, murmurs, gallops, clicks or pedal edema. Telemetry: A. fib with RVR  Gastrointestinal system: Abdomen is distended, soft and nontender.  Paucity of bowel sounds heard.  Central nervous system: Alert and oriented x1 . No focal neurological deficits.  Extremities: Symmetric 5 x 5 power.   Data Reviewed: Basic Metabolic Panel:  Recent Labs Lab 10/01/12 0846 10/02/12 0820 10/03/12 0625 10/04/12 0530  NA 136 136 133* 139  K 4.2 3.7 5.4* 3.8  CL 99 100 93* 100  CO2 22 21 26 24   GLUCOSE 88 78 99 128*  BUN 20 27* 27* 30*  CREATININE 1.42* 2.11* 1.99* 1.52*  CALCIUM 8.8 9.0 9.2 9.1  MG  --   --  2.5  --    Liver Function Tests:  Recent Labs Lab 10/01/12 0846 10/02/12 0820 10/03/12 0625  AST 14 22 54*  ALT 12 11 13   ALKPHOS 59 77 61  BILITOT 0.9 2.0* 1.6*  PROT 6.6 6.3 7.2  ALBUMIN 3.1* 2.8* 2.8*   No results found for this basename: LIPASE, AMYLASE,  in the last 168 hours No results found for this basename: AMMONIA,  in the last 168 hours CBC:  Recent Labs Lab 10/01/12 0846 10/02/12 0510 10/03/12 0625 10/04/12 0530  WBC 8.8 16.2* 14.5* 10.9*  NEUTROABS 7.3  --   --   --   HGB 14.4 13.3 14.5 14.1  HCT 43.6 38.4* 44.7 41.5  MCV 87.2 86.7 87.3 85.4  PLT 210 PLATELET CLUMPS NOTED ON SMEAR, COUNT APPEARS ADEQUATE 206 232   Cardiac Enzymes:  Recent Labs Lab 10/01/12 0849  TROPONINI <0.30   BNP (last 3 results)  Recent Labs  09/15/12 1130 10/01/12 0849 10/03/12 0625  PROBNP 1760.0* 1059.0* 993.5*   CBG: No results found for this basename: GLUCAP,  in the last 168 hours  Recent Results (from the past 240 hour(s))  CULTURE, BLOOD (ROUTINE X 2)     Status: None   Collection Time    10/01/12  1:49 PM       Result Value Range Status   Specimen Description BLOOD RIGHT FOREARM   Final   Special Requests BOTTLES DRAWN AEROBIC ONLY 6CC   Final   Culture  Setup Time 10/01/2012 17:12   Final   Culture     Final   Value:        BLOOD CULTURE RECEIVED NO GROWTH TO DATE CULTURE WILL BE HELD FOR 5 DAYS BEFORE ISSUING A FINAL NEGATIVE REPORT   Report Status PENDING   Incomplete  CULTURE, BLOOD (ROUTINE X 2)     Status: None   Collection Time    10/01/12  1:55 PM      Result Value Range Status   Specimen Description BLOOD THUMB RIGHT   Final   Special Requests BOTTLES DRAWN AEROBIC ONLY 5CC   Final   Culture  Setup Time 10/01/2012 17:12   Final   Culture     Final   Value:        BLOOD CULTURE RECEIVED NO GROWTH TO DATE CULTURE WILL BE HELD FOR 5 DAYS BEFORE  ISSUING A FINAL NEGATIVE REPORT   Report Status PENDING   Incomplete  MRSA PCR SCREENING     Status: None   Collection Time    10/01/12  4:29 PM      Result Value Range Status   MRSA by PCR NEGATIVE  NEGATIVE Final   Comment:            The GeneXpert MRSA Assay (FDA     approved for NASAL specimens     only), is one component of a     comprehensive MRSA colonization     surveillance program. It is not     intended to diagnose MRSA     infection nor to guide or     monitor treatment for     MRSA infections.     Studies: Ct Abdomen Pelvis Wo Contrast  10/03/2012   *RADIOLOGY REPORT*  Clinical Data: Mid abdominal pain  CT ABDOMEN AND PELVIS WITHOUT CONTRAST  Technique:  Multidetector CT imaging of the abdomen and pelvis was performed following the standard protocol without intravenous contrast.  Comparison: 06/30/2011  Findings: Calcified pleural plaques at the right lung base are stable.  Fibrotic changes at the lung bases are stable.  Ill- defined opacities in the right middle and lower lobe visualized on this study are present.  These are characterized by a focal rounded nodular areas with surrounding ground-glass and interstitial  changes.  Several areas are present.  The largest is 2.0 cm in the right middle lobe on image #1.  Coronary artery calcifications.  Pacemaker device is present.  Post cholecystectomy.  Liver, spleen, pancreas, adrenal glands are within normal limits.  Atherosclerotic changes of the aorta are present.  Maximal AP diameter of the aorta is 3.7 cm.  This is not significantly changed.  Non-visualized appendix.  Sigmoid diverticulosis is present without evidence of acute diverticulitis.  Proximal small bowel is dilated with air-fluid levels.  Distal small bowel is completely decompressed.  Colon is decompressed. The focal transition point is not clearly visualized.  The transition point probably occurs in the proximal or mid ileum within the right side of the abdomen.  Foley catheter decompresses the bladder.  Stable prostate gland.  Degenerative disc disease at L4-5 and L5-S1.  Simple cysts in the left kidney are stable.  IMPRESSION: Small bowel obstruction pattern as described.  The exact transition point is not clearly evident in this may be due to adhesions.  There are several ill-defined opacities in the right middle and lower lobes.  The finding is nonspecific and likely represents an inflammatory process.  Malignancy is not entirely excluded.  Short- term follow-up studies are recommended to ensure resolution of these findings.  If there is strong concern for malignancy, PET CT can be performed.  Stable chronic findings.   Original Report Authenticated By: Jolaine Click, M.D.   Ct Head Wo Contrast  10/02/2012   *RADIOLOGY REPORT*  Clinical Data: Left arm weakness, fatigue, and headache.  CT HEAD WITHOUT CONTRAST  Technique:  Contiguous axial images were obtained from the base of the skull through the vertex without contrast.  Comparison: 10/01/2012  Findings: Despite efforts by the patient and technologist, motion artifact is present on some series of today's examination and could not be totally eliminated.  This  reduces diagnostic sensitivity and specificity.  Cytotoxic edema and hypodensity in a band in the right frontal lobe on images 22-25 of series 2.  Chronic microvascular white matter disease noted.  No intracranial hemorrhage is observed.  Basal ganglia, brain stem, and cerebellum intact.  Atherosclerotic calcification of the carotid siphons noted.  IMPRESSION:  1.  Cytotoxic edema and hypodensity in the vicinity of the inferior frontal gyrus and precentral gyrus on the right, compatible with subacute stroke.  No hemorrhagic transformation observed.   Original Report Authenticated By: Gaylyn Rong, M.D.   Dg Abd Portable 2v  10/03/2012   *RADIOLOGY REPORT*  Clinical Data: Abdominal pain and vomiting.  PORTABLE ABDOMEN - 2 VIEW  Comparison: CT of the abdomen and pelvis, and abdominal radiograph, performed 06/30/2011  Findings: There is dilatation of small bowel loops to 4.5 cm in maximal diameter, with distension of the stomach.  Associated air- fluid levels are seen.  Findings are concerning for small bowel obstruction, though residual stool is seen within the sigmoid colon.  No free intra-abdominal air is identified on the provided decubitus view.  The visualized lung bases are grossly clear.  The patient is status post median sternotomy.  No acute osseous abnormalities are seen.  IMPRESSION: Dilatation of small bowel loops to 4.5 cm in maximal diameter, with associated distension of the stomach.  Air-fluid levels noted. Findings concerning for small bowel obstruction; no free intra- abdominal air seen.  These results were called by telephone on 10/03/2012 at 05:04 a.m. to [facility] on [id number], who verbally acknowledged these results.   Original Report Authenticated By: Tonia Ghent, M.D.     Additional labs:   Scheduled Meds: . aspirin  300 mg Rectal Daily  . atorvastatin  20 mg Oral Q breakfast  . docusate sodium  100 mg Oral Daily  . enoxaparin (LOVENOX) injection  40 mg Subcutaneous Q24H   . gabapentin  600 mg Oral TID  . guaiFENesin  1,200 mg Oral BID  . hydrocortisone sod succinate (SOLU-CORTEF) inj  25 mg Intravenous Q12H  . Ipratropium-Albuterol  2 puff Inhalation BID  . loratadine  10 mg Oral Q breakfast  . magnesium chloride  1 tablet Oral BID  . metoprolol  5 mg Intravenous Q6H  . piperacillin-tazobactam (ZOSYN)  IV  3.375 g Intravenous Q8H  . vancomycin  1,000 mg Intravenous Q24H  . Vitamin D (Ergocalciferol)  50,000 Units Oral Q7 days   Continuous Infusions: . sodium chloride 75 mL/hr at 10/03/12 1316    Principal Problem:   Stroke, lacunar Active Problems:   COPD (chronic obstructive pulmonary disease)   CAD CABG 1998, patent grafts 06/06/11 after abn Myoview   Pacemaker, [employer], implanted 2008   Cardiomyopathy, ischemic, EF 30-35% by recent cath   Chronic combined systolic and diastolic congestive heart failure   Chronic kidney disease, stage III (moderate)   Acute on chronic combined systolic and diastolic congestive heart failure, NYHA class 3   Possible reccuring Aspiration pneumonia, worsened by potential CVA   Atrial fibrillation with RVR   Small bowel obstruction   Renal failure, acute on chronic   Hyperkalemia    Time spent:  30 minutes     [facility]  [facility] Pager [phone].   If 8PM-8AM, please contact night-coverage at [email or web address], password [facility] 10/04/2012, 7:24 AM  LOS: 3 days

## 2012-10-05 ENCOUNTER — Inpatient Hospital Stay (HOSPITAL_COMMUNITY): Payer: Medicare Other

## 2012-10-05 LAB — BASIC METABOLIC PANEL
CO2: 24 mEq/L (ref 19–32)
Chloride: 101 mEq/L (ref 96–112)
Glucose, Bld: 107 mg/dL — ABNORMAL HIGH (ref 70–99)
Potassium: 4 mEq/L (ref 3.5–5.1)
Sodium: 137 mEq/L (ref 135–145)

## 2012-10-05 MED ORDER — CLOPIDOGREL BISULFATE 75 MG PO TABS
75.0000 mg | ORAL_TABLET | Freq: Every day | ORAL | Status: DC
  Administered 2012-10-05 – 2012-10-06 (×2): 75 mg via ORAL
  Filled 2012-10-05 (×3): qty 1

## 2012-10-05 MED ORDER — SODIUM CHLORIDE 0.9 % IV SOLN
INTRAVENOUS | Status: AC
  Administered 2012-10-05: 10:00:00 via INTRAVENOUS

## 2012-10-05 NOTE — Progress Notes (Signed)
Pt states he feels much better, pt oob with assist x 2, pt voided 350 with a PVR of 105, pt on room air O2 sats 98%, pt has increased movement in left arm, will continue to monitor

## 2012-10-05 NOTE — Progress Notes (Signed)
Stroke Team Progress Note  HISTORY Richard Chavez is an 79 y.o. male white male with PMH of CAD s/p CABG, CHF EF 30-35%, s/p PPM, HTN, and COPD presenting to the ED today 10/01/2012 alongside his son with complaints of worsening fatigue, generalized weakness, left arm weakness, SOB, fever, and chills. History is mainly obtained by son, [name], who explains that his father started complaining of left hand numbness yesterday morning (09/30/2012) ~11am. They went to the PCP office and returned home later that day thought to have numbness secondary to neuropathy. He was still moving his arm and able to drive and walk at that time, however, upon waking up this 10/02/28, he was confused, not moving his left arm at all, and had difficulty walking. Mr. Grosso does not have any similar prior episodes or any history of CVA. His son does claim he takes an ASA daily, however, it is not on his medication list on epic and did not get any ASA this morning. Of note, he was recently discharged from the hospital on 09/17/12 for CAP on levofloxacin course.   Patient was not a TPA candidate secondary to delay in arrival. He was admitted for further evaluation and treatment.  SUBJECTIVE SBO improved, he is started on clear diet today  OBJECTIVE Most recent Vital Signs: Filed Vitals:   10/04/12 1700 10/04/12 2007 10/04/12 2131 10/05/12 0421  BP: 129/83  105/62 112/79  Pulse: 138  109 92  Temp:   98 F (36.7 C) 98.2 F (36.8 C)  TempSrc:   Oral Oral  Resp:   20 20  Height:      Weight:    103.2 kg (227 lb 8.2 oz)  SpO2: 99% 94% 96% 95%   CBG (last 3)  No results found for this basename: GLUCAP,  in the last 72 hours  IV Fluid Intake:   . sodium chloride 50 mL/hr at 10/05/12 0946    MEDICATIONS  . atorvastatin  20 mg Oral Q breakfast  . clopidogrel  75 mg Oral Q breakfast  . docusate sodium  100 mg Oral Daily  . enoxaparin (LOVENOX) injection  40 mg Subcutaneous Q24H  . gabapentin  600 mg Oral TID  .  guaiFENesin  1,200 mg Oral BID  . hydrocortisone sod succinate (SOLU-CORTEF) inj  25 mg Intravenous Q12H  . Ipratropium-Albuterol  2 puff Inhalation BID  . loratadine  10 mg Oral Q breakfast  . magnesium chloride  1 tablet Oral BID  . metoprolol  7.5 mg Intravenous Q6H  . piperacillin-tazobactam (ZOSYN)  IV  3.375 g Intravenous Q8H  . vancomycin  1,000 mg Intravenous Q24H  . Vitamin D (Ergocalciferol)  50,000 Units Oral Q7 days   PRN:    Diet:  NPO Activity:  Up as tolerated DVT Prophylaxis:  SCDs   CLINICALLY SIGNIFICANT STUDIES Basic Metabolic Panel:   Recent Labs Lab 10/03/12 0625 10/04/12 0530 10/05/12 0400  NA 133* 139 137  K 5.4* 3.8 4.0  CL 93* 100 101  CO2 26 24 24   GLUCOSE 99 128* 107*  BUN 27* 30* 35*  CREATININE 1.99* 1.52* 1.35  CALCIUM 9.2 9.1 8.8  MG 2.5  --   --    Liver Function Tests:   Recent Labs Lab 10/02/12 0820 10/03/12 0625  AST 22 54*  ALT 11 13  ALKPHOS 77 61  BILITOT 2.0* 1.6*  PROT 6.3 7.2  ALBUMIN 2.8* 2.8*   CBC:   Recent Labs Lab 10/01/12 0846  10/03/12 0625 10/04/12  0530  WBC 8.8  < > 14.5* 10.9*  NEUTROABS 7.3  --   --   --   HGB 14.4  < > 14.5 14.1  HCT 43.6  < > 44.7 41.5  MCV 87.2  < > 87.3 85.4  PLT 210  < > 206 232  < > = values in this interval not displayed. Coagulation: No results found for this basename: LABPROT, INR,  in the last 168 hours Cardiac Enzymes:   Recent Labs Lab 10/01/12 0849  TROPONINI <0.30   Urinalysis:   Recent Labs Lab 10/01/12 0925  COLORURINE YELLOW  LABSPEC 1.015  PHURINE 7.0  GLUCOSEU NEGATIVE  HGBUR NEGATIVE  BILIRUBINUR NEGATIVE  KETONESUR NEGATIVE  PROTEINUR NEGATIVE  UROBILINOGEN 1.0  NITRITE NEGATIVE  LEUKOCYTESUR NEGATIVE   Lipid Panel    Component Value Date/Time   CHOL 92 10/02/2012 0820   HgbA1C  Lab Results  Component Value Date   HGBA1C 5.6 10/02/2012   LDL=26  Urine Drug Screen:   No results found for this basename: labopia,  cocainscrnur,   labbenz,  amphetmu,  thcu,  labbarb    Alcohol Level: No results found for this basename: ETH,  in the last 168 hours  CT of the brain  10/01/2012   1.  No acute intracranial abnormality 2.  Stable mild atrophy and chronic white matter ischemic changes 3.  Intracranial atherosclerosis                               10/02/2012 Cytotoxic edema and hypodensity in the vicinity of the inferior frontal gyrus and precentral gyrus on the right, compatible with subacute stroke. No hemorrhagic transformation observed  MRI of the brain    MRA of the brain    2D Echocardiogram  40-45%. No PFO or defect identified.  Carotid Doppler  Preliminary findings: Right = 40-59% ICA stenosis. Left = Borderline 60-79% ICA stenosis.  CXR  10/01/2012    Worsened aeration, especially on the right.  Suspicious for alveolar pulmonary edema versus multifocal infection.  Degraded exam, especially the lateral view.    EKG  sinus tachycardia.   Therapy Recommendations   Physical Exam   Obese elderly Caucasian male in mild respiratory distress.Awake alert. Afebrile. Head is nontraumatic. Neck is supple without bruit. Hearing is normal. Cardiac exam no murmur or gallop. Lungs are clear to auscultation. Distal pulses are well felt.1 + pedal edema bilaterally. Neurological Exam ; Awake alert oriented x 3 normal speech and language. Mild left lower face asymmetry. Tongue midline. Mild LUE drift.LUE 4/5 weakness Mild diminished fine finger movements on left. Orbits right over left upper extremity. Mild left grip weak.Marland KitchenLLE 4/5 weakness.Diminished left hemibody sensation . Normal coordination. Gait deferred.   ASSESSMENT Richard Chavez is a 79 y.o. male presenting with worsening fatigue, generalized weakness, left arm weakness, SOB, fever, and chills. Initial CT imaging negative; unable to have MRI due to pacer. Suspect right brain infarct based on symptoms. Infarct felt to be thrombotic secondary to small vessel disease.  On  aspirin 325 mg orally every day prior to admission. Now on aspirin 325 mg orally every day for secondary stroke prevention. Patient with resultant left facial weakness, left hemiparesis. Work up underway.  BM overnight. No longer NG to suction. Restarted clear liquids.   Hypertension\ Ctotoxic edema and hypodensity in the vicinity of the inferior frontal gyrus and precentral gyrus on the right, compatible with subacute stroke Hyperlipidemia,  LDL pending, on lipitor 20 mg daily PTA, now on lipitor 20 mg daily, goal LDL < 100 (< 70 for diabetics) CAD - CABG 1978, pacemaker, [place] OSA PVD Ischemic cardiomyopathy EF 35-40% COPD LDL 26 hgba1c 5.6 Right = 16-10% ICA stenosis. Left = Borderline 60-79% ICA stenosis. SBO improved started on clear diet  Hospital day # 4  TREATMENT/PLAN  Restart PO plavix  A-fib with RVR that is paroxysmal. Wait at least 7-14 days prior to anticoagulation due to stroke.   CIR Recommended in the future.   TCD to look at vasculature  Case d/w cardiology will most likely be started on coumadin in the future.    10/05/2012 10:15 AM Pauletta Browns

## 2012-10-05 NOTE — Progress Notes (Signed)
Pulled ng. Several BMs. Denies abd pain. No n/v. Reports flatus  Alert, nad, talking on phone Soft, obese, full, nt. No guarding. Hypoactive BS  abd xray - still with dilated loops   psbo - improving Ok with leaving ng out Will let have SIPS of liquids - not a tray Ok with po meds  Mary Sella. Andrey Campanile, MD, FACS General, Bariatric, & Minimally Invasive Surgery [facility], [place]

## 2012-10-05 NOTE — Progress Notes (Signed)
[facility] PROGRESS NOTE  KAVI ALMQUIST ZOX:[id number] DOB: [birth year] DOA: 10/01/2012 PCP: Thayer Headings, MD  Brief narrative 79 year old male with extensive past medical history including CAD, CABG, ischemic cardiomyopathy, EF 35-40% 3/13, PPM, chronic combined systolic/diastolic CHF HTN, HL, GERD, COPD, chronic kidney disease, recent hospitalization 5/12-5/14 for community-acquired pneumonia presented on 5/28 with complaints of numbness in left hand, chills, weakness, mild productive sputum, fevers, mild SOB and leg edema. In the ED, chest x-ray suggestive of pulmonary edema, ABG showed pH of of 72%, creatinine 1.42, proBNP 1059. Hospitalist admission requested.    Assessment/Plan: 1. Subacute right brain infarct: Initial CT imaging was negative but subsequent one showed subacute stroke. Unable to do MRI secondary to pacer. His CVA could be embolic from A. fib-neurology does not recommend anticoagulation for 7-14 days post acute stroke. SBO has improved-we'll resume by mouth Plavix and statins. Complete stroke workup. 2. Small bowel obstruction: Unclear etiology. Surgery following. Patient had 5 BMs on 5/31. Abdominal distention better although x-rays still show SBO. Surgeons and evaluated and cleared for oral sips and meds. Monitor clinically. 3. Acute on on chronic kidney disease stage III: Likely precipitated by diuresis. Hold Lasix. Treat with IV fluids and follow BMP. Improving-reduce IV fluids. 4. Hyperkalemia: Secondary to acute renal failure and potassium supplements. Hold potassium supplements. IV fluids and resolved. 5. A. fib with RVR and NSVT: Management per cardiology. Beta blocker changed to IV metoprolol. Cardiology recommends long-term anticoagulation when able. Continue IV metoprolol until by mouth absorption confirmed. 6. Shortness of breath-multifactorial-potentially aspiration pneumonia +/-CHF exacerbation-patient's dry weight is 233 pounds. Continue daily weights,  strict in and out. Clinically dry and patient denies dyspnea. Brief IV fluids and hold Lasix. Compensated. 3. Potential aspiration pneumonia : Agree with empiric Vancomycin/Zosyn for now. CT abdomen showed right middle lobe and lower lobe opacities. Will need followup imaging to ensure resolution. Will get speech therapist input to rule out overt oropharyngeal dysphagia. Oxygen and nebs to continue as needed. We'll consider switching to oral Augmentin when tolerating by mouth well. 4. History of CAD/cardiomyopathy/CHF NYHA class II- cardiology input appreciated. 5. COPD history-continue Combivent inhaler 2 puff twice a day. Continue home dose prednisone 5 mg tablet-changed to IV hydrocortisone secondary to n.p.o.. Continue tiotropium 18 mcg daily every morning. COPD appears to be at baseline. No wheezing. 6. Hypotension-seems like a chronic issue.  resolved.  7. Hyperlipidemia continue Lipitor 20 mg daily with breakfast. Lipid panel stable. 8. Confusion: Possibly from acute illness-improving.   Code Status: Full Family Communication: Discussed with patient's son Mr. Takai Chiaramonte on 5/31. Disposition Plan: Remains inpatient.   Consultants:  Cardiology  Neurology  Rehab MD  General surgery  Procedures:  None  Antibiotics:  Vancomycin  Zosyn   HPI/Subjective: Patient says he is much better today. Denies abdominal pain or dyspnea. 5 BMs on 5/31.  Objective: Filed Vitals:   10/04/12 1700 10/04/12 2007 10/04/12 2131 10/05/12 0421  BP: 129/83  105/62 112/79  Pulse: 138  109 92  Temp:   98 F (36.7 C) 98.2 F (36.8 C)  TempSrc:   Oral Oral  Resp:   20 20  Height:      Weight:    103.2 kg (227 lb 8.2 oz)  SpO2: 99% 94% 96% 95%    Intake/Output Summary (Last 24 hours) at 10/05/12 0737 Last data filed at 10/05/12 0300  Gross per 24 hour  Intake 1336.25 ml  Output   1075 ml  Net 261.25 ml   [employer]  10/03/12 0534 10/04/12 0502 10/05/12 0421  Weight: 104.826 kg  (231 lb 1.6 oz) 103.375 kg (227 lb 14.4 oz) 103.2 kg (227 lb 8.2 oz)    Exam:   General exam: Comfortable. Mucosa dry but better.  Respiratory system:  reduced breath sounds in the bases but otherwise clear to auscultation . No increased work of breathing. Midline sternotomy scar.  Cardiovascular system: S1 & S2 heard,  irregular . No JVD, murmurs, gallops, clicks or pedal edema. Telemetry: A. fib with CVR  Gastrointestinal system: Abdomen is less distended, soft and nontender.  Normal bowel sounds heard.  Central nervous system: Alert and oriented x3 . No focal neurological deficits.  Extremities: Symmetric 5 x 5 power.   Data Reviewed: Basic Metabolic Panel:  Recent Labs Lab 10/01/12 0846 10/02/12 0820 10/03/12 0625 10/04/12 0530 10/05/12 0400  NA 136 136 133* 139 137  K 4.2 3.7 5.4* 3.8 4.0  CL 99 100 93* 100 101  CO2 22 21 26 24 24   GLUCOSE 88 78 99 128* 107*  BUN 20 27* 27* 30* 35*  CREATININE 1.42* 2.11* 1.99* 1.52* 1.35  CALCIUM 8.8 9.0 9.2 9.1 8.8  MG  --   --  2.5  --   --    Liver Function Tests:  Recent Labs Lab 10/01/12 0846 10/02/12 0820 10/03/12 0625  AST 14 22 54*  ALT 12 11 13   ALKPHOS 59 77 61  BILITOT 0.9 2.0* 1.6*  PROT 6.6 6.3 7.2  ALBUMIN 3.1* 2.8* 2.8*   No results found for this basename: LIPASE, AMYLASE,  in the last 168 hours No results found for this basename: AMMONIA,  in the last 168 hours CBC:  Recent Labs Lab 10/01/12 0846 10/02/12 0510 10/03/12 0625 10/04/12 0530  WBC 8.8 16.2* 14.5* 10.9*  NEUTROABS 7.3  --   --   --   HGB 14.4 13.3 14.5 14.1  HCT 43.6 38.4* 44.7 41.5  MCV 87.2 86.7 87.3 85.4  PLT 210 PLATELET CLUMPS NOTED ON SMEAR, COUNT APPEARS ADEQUATE 206 232   Cardiac Enzymes:  Recent Labs Lab 10/01/12 0849  TROPONINI <0.30   BNP (last 3 results)  Recent Labs  09/15/12 1130 10/01/12 0849 10/03/12 0625  PROBNP 1760.0* 1059.0* 993.5*   CBG: No results found for this basename: GLUCAP,  in the last  168 hours  Recent Results (from the past 240 hour(s))  CULTURE, BLOOD (ROUTINE X 2)     Status: None   Collection Time    10/01/12  1:49 PM      Result Value Range Status   Specimen Description BLOOD RIGHT FOREARM   Final   Special Requests BOTTLES DRAWN AEROBIC ONLY 6CC   Final   Culture  Setup Time 10/01/2012 17:12   Final   Culture     Final   Value:        BLOOD CULTURE RECEIVED NO GROWTH TO DATE CULTURE WILL BE HELD FOR 5 DAYS BEFORE ISSUING A FINAL NEGATIVE REPORT   Report Status PENDING   Incomplete  CULTURE, BLOOD (ROUTINE X 2)     Status: None   Collection Time    10/01/12  1:55 PM      Result Value Range Status   Specimen Description BLOOD THUMB RIGHT   Final   Special Requests BOTTLES DRAWN AEROBIC ONLY 5CC   Final   Culture  Setup Time 10/01/2012 17:12   Final   Culture     Final   Value:  BLOOD CULTURE RECEIVED NO GROWTH TO DATE CULTURE WILL BE HELD FOR 5 DAYS BEFORE ISSUING A FINAL NEGATIVE REPORT   Report Status PENDING   Incomplete  MRSA PCR SCREENING     Status: None   Collection Time    10/01/12  4:29 PM      Result Value Range Status   MRSA by PCR NEGATIVE  NEGATIVE Final   Comment:            The GeneXpert MRSA Assay (FDA     approved for NASAL specimens     only), is one component of a     comprehensive MRSA colonization     surveillance program. It is not     intended to diagnose MRSA     infection nor to guide or     monitor treatment for     MRSA infections.     Studies: Ir Fluoro Guide Cv Line Right  10/04/2012   *RADIOLOGY REPORT*  Clinical data: Small bowel obstruction, needs durable venous access.  RIGHT IJ CENTRAL VENOUS CATHETER PLACEMENT UNDER ULTRASOUND AND FLUOROSCOPIC GUIDANCE:  Technique and findings:  The procedure, risks (including but not limited to bleeding, infection, organ damage), benefits, and alternatives were explained to the the patient's son over the telephone.  Questions regarding the procedure were encouraged and  answered.  The family member understands and consents to the procedure. Patency of the right IJ vein was confirmed with ultrasound with image documentation. An appropriate skin site was determined. Skin site was marked. Region was prepped using maximum barrier technique including cap and mask, sterile gown, sterile gloves, large sterile sheet, and Chlorhexidine   as cutaneous antisepsis.  The region was infiltrated locally with 1% lidocaine. Under real-time ultrasound guidance, the right IJ vein was accessed with a 21 gauge micropuncture needle; the needle tip within the vein was confirmed with ultrasound image documentation.  The needle exchanged over a guidewire for peel-away sheath through which an 17cm 5 [place] PowerPICC dual lumen catheter was advanced. This was positioned with the tip at the cavoatrial junction. Spot chest radiograph shows good positioning and no pneumothorax. Catheter was flushed and sutured externally with 0-Prolene sutures. Patient tolerated the procedure well, with no immediate complication. Fluoroscopy time: 48 seconds  IMPRESSION: 1. Technically successful right IJ double lumen power injectable central venous catheter placement.   Original Report Authenticated By: D. Andria Rhein, MD   Ir US Guide Vasc Access Right  10/04/2012   *RADIOLOGY REPORT*  Clinical data: Small bowel obstruction, needs durable venous access.  RIGHT IJ CENTRAL VENOUS CATHETER PLACEMENT UNDER ULTRASOUND AND FLUOROSCOPIC GUIDANCE:  Technique and findings:  The procedure, risks (including but not limited to bleeding, infection, organ damage), benefits, and alternatives were explained to the the patient's son over the telephone.  Questions regarding the procedure were encouraged and answered.  The family member understands and consents to the procedure. Patency of the right IJ vein was confirmed with ultrasound with image documentation. An appropriate skin site was determined. Skin site was marked. Region was  prepped using maximum barrier technique including cap and mask, sterile gown, sterile gloves, large sterile sheet, and Chlorhexidine   as cutaneous antisepsis.  The region was infiltrated locally with 1% lidocaine. Under real-time ultrasound guidance, the right IJ vein was accessed with a 21 gauge micropuncture needle; the needle tip within the vein was confirmed with ultrasound image documentation.  The needle exchanged over a guidewire for peel-away sheath through which an 17cm 5 [place] PowerPICC  dual lumen catheter was advanced. This was positioned with the tip at the cavoatrial junction. Spot chest radiograph shows good positioning and no pneumothorax. Catheter was flushed and sutured externally with 0-Prolene sutures. Patient tolerated the procedure well, with no immediate complication. Fluoroscopy time: 48 seconds  IMPRESSION: 1. Technically successful right IJ double lumen power injectable central venous catheter placement.   Original Report Authenticated By: D. Andria Rhein, MD   Dg Abd Portable 1v  10/05/2012   *RADIOLOGY REPORT*  Clinical Data: SBO  PORTABLE ABDOMEN - 1 VIEW  Comparison: 10/04/2012  Findings: Mildly prominent/dilated loops of small bowel in the mid abdomen, with paucity of colonic bowel gas, compatible with small bowel obstruction.  The caliber of some of the small bowel loops have mildly decreased when compared to the prior study.  IMPRESSION: Findings compatible with small bowel obstruction, although possibly mildly improved.   Original Report Authenticated By: Charline Bills, M.D.   Dg Abd Portable 1v  10/04/2012   *RADIOLOGY REPORT*  Clinical Data: Follow-up small bowel obstruction  PORTABLE ABDOMEN - 1 VIEW  Comparison: CT, 10/03/2012  Findings: Dilated loops of small bowel are noted in the central abdomen consistent with a partial small bowel obstruction and unchanged from the prior CT.  There are calcifications along the abdominal aorta and the iliac arteries.  Surgical clips  are noted in the right upper quadrant from a prior cholecystectomy.  A nasogastric tube tip lies in the mid stomach.  The soft tissues are otherwise unremarkable.  IMPRESSION: Persistent small bowel obstruction, without significant change from the previous day's CT scan.   Original Report Authenticated By: Amie Portland, M.D.     Additional labs:   Scheduled Meds: . aspirin  300 mg Rectal Daily  . atorvastatin  20 mg Oral Q breakfast  . docusate sodium  100 mg Oral Daily  . enoxaparin (LOVENOX) injection  40 mg Subcutaneous Q24H  . gabapentin  600 mg Oral TID  . guaiFENesin  1,200 mg Oral BID  . hydrocortisone sod succinate (SOLU-CORTEF) inj  25 mg Intravenous Q12H  . Ipratropium-Albuterol  2 puff Inhalation BID  . loratadine  10 mg Oral Q breakfast  . magnesium chloride  1 tablet Oral BID  . metoprolol  7.5 mg Intravenous Q6H  . piperacillin-tazobactam (ZOSYN)  IV  3.375 g Intravenous Q8H  . vancomycin  1,000 mg Intravenous Q24H  . Vitamin D (Ergocalciferol)  50,000 Units Oral Q7 days   Continuous Infusions: . sodium chloride 75 mL/hr at 10/04/12 1755    Principal Problem:   Stroke, lacunar Active Problems:   Hypertension   NSVT    COPD (chronic obstructive pulmonary disease)   CAD CABG 1998, patent grafts 06/06/11 after abn Myoview   Pacemaker, St Jude, implanted 2008   Cardiomyopathy, ischemic, EF 30-35% by recent cath   Chronic combined systolic and diastolic congestive heart failure   CKD (chronic kidney disease) stage 2, GFR 60-89 ml/min   OSA (obstructive sleep apnea)   Chronic kidney disease, stage III (moderate)   Acute on chronic combined systolic and diastolic congestive heart failure, NYHA class 3   Possible reccuring Aspiration pneumonia, worsened by potential CVA   Atrial fibrillation with RVR   Small bowel obstruction   Renal failure, acute on chronic   Hyperkalemia    Time spent:  30 minutes     [facility]  [facility] Pager [phone].    If 8PM-8AM, please contact night-coverage at [email or web address], password [facility] 10/05/2012, 7:37 AM  LOS: 4  days

## 2012-10-05 NOTE — Progress Notes (Signed)
[facility]  Subjective: No CP, SOB, ABD pain.  Objective: Vital signs in last 24 hours: Temp:  [98 F (36.7 C)-98.2 F (36.8 C)] 98.2 F (36.8 C) (06/01 0421) Pulse Rate:  [92-145] 92 (06/01 0421) Resp:  [20] 20 (06/01 0421) BP: (105-152)/(62-109) 112/79 mmHg (06/01 0421) SpO2:  [94 %-99 %] 95 % (06/01 0421) Weight:  [227 lb 8.2 oz (103.2 kg)] 227 lb 8.2 oz (103.2 kg) (06/01 0421) Last BM Date: 10/04/12  Intake/Output from previous day: 05/31 0701 - 06/01 0700 In: 1336.3 [P.O.:30; I.V.:606.3; IV Piggyback:700] Out: 1075 [Urine:1075] Intake/Output this shift:    Medications Current Facility-Administered Medications  Medication Dose Route Frequency Provider Last Rate Last Dose  . 0.9 %  sodium chloride infusion   Intravenous Continuous Elease Etienne, MD 75 mL/hr at 10/04/12 1755    . aspirin suppository 300 mg  300 mg Rectal Daily Layne Benton, NP   300 mg at 10/04/12 1047  . atorvastatin (LIPITOR) tablet 20 mg  20 mg Oral Q breakfast Rhetta Mura, MD   20 mg at 10/02/12 0803  . docusate sodium (COLACE) capsule 100 mg  100 mg Oral Daily Marykay Lex, MD   100 mg at 10/02/12 1052  . enoxaparin (LOVENOX) injection 40 mg  40 mg Subcutaneous Q24H Layne Benton, NP   40 mg at 10/04/12 1203  . gabapentin (NEURONTIN) capsule 600 mg  600 mg Oral TID Rhetta Mura, MD   600 mg at 10/02/12 2133  . guaiFENesin (MUCINEX) 12 hr tablet 1,200 mg  1,200 mg Oral BID Rhetta Mura, MD   1,200 mg at 10/02/12 2133  . hydrocortisone sodium succinate (SOLU-CORTEF) 100 mg/2 mL injection 25 mg  25 mg Intravenous Q12H Elease Etienne, MD   25 mg at 10/05/12 0017  . Ipratropium-Albuterol (COMBIVENT) respimat 2 puff  2 puff Inhalation BID Rhetta Mura, MD   2 puff at 10/05/12 0930  . loratadine (CLARITIN) tablet 10 mg  10 mg Oral Q breakfast Rhetta Mura, MD   10 mg at 10/02/12 0803  . magnesium chloride (SLOW-MAG) 64 MG SR tablet 64  mg  1 tablet Oral BID Rhetta Mura, MD   64 mg at 10/02/12 2133  . magnesium hydroxide (MILK OF MAGNESIA) suspension 5 mL  5 mL Oral Daily PRN Gerome Apley Harduk, PA-C   5 mL at 10/03/12 0146  . metoprolol (LOPRESSOR) injection 7.5 mg  7.5 mg Intravenous Q6H Marykay Lex, MD   7.5 mg at 10/05/12 0509  . ondansetron (ZOFRAN) injection 4 mg  4 mg Intravenous Q8H PRN Gerome Apley Harduk, PA-C   4 mg at 10/02/12 2008  . piperacillin-tazobactam (ZOSYN) IVPB 3.375 g  3.375 g Intravenous Q8H Ann Held, RPH   3.375 g at 10/05/12 0510  . vancomycin (VANCOCIN) IVPB 1000 mg/200 mL premix  1,000 mg Intravenous Q24H Elease Etienne, MD   1,000 mg at 10/04/12 2200  . Vitamin D (Ergocalciferol) (DRISDOL) capsule 50,000 Units  50,000 Units Oral Q7 days Rhetta Mura, MD        PE: General appearance: alert, cooperative and no distress Lungs: CTA bilaterally.  No rales. Heart: irregularly irregular rhythm and no MM.  Rate controlled. ABD:  Distended, Nontender, +BS Extremities: No LEE Pulses: 2+ and symmetric 1+ PT pulses. Skin: One skin tear on the right arm and two on the left,  Neurologic: Unable to flex fingers.  Some flexion now at left elbow.  Able to move  left leg.   Lab Results:   Recent Labs  10/03/12 0625 10/04/12 0530  WBC 14.5* 10.9*  HGB 14.5 14.1  HCT 44.7 41.5  PLT 206 232   BMET  Recent Labs  10/03/12 0625 10/04/12 0530 10/05/12 0400  NA 133* 139 137  K 5.4* 3.8 4.0  CL 93* 100 101  CO2 26 24 24   GLUCOSE 99 128* 107*  BUN 27* 30* 35*  CREATININE 1.99* 1.52* 1.35  CALCIUM 9.2 9.1 8.8   PT/INR No results found for this basename: LABPROT, INR,  in the last 72 hours Cholesterol No results found for this basename: CHOL,  in the last 72 hours  Assessment/Plan   Principal Problem:   Stroke, lacunar Active Problems:   Atrial fibrillation with RVR   Hypertension   NSVT    COPD (chronic obstructive pulmonary disease)   CAD CABG 1998, patent grafts  06/06/11 after abn Myoview   Pacemaker, St Jude, implanted 2008   Cardiomyopathy, ischemic, EF 30-35% by recent cath   Chronic combined systolic and diastolic congestive heart failure   CKD (chronic kidney disease) stage 2, GFR 60-89 ml/min   OSA (obstructive sleep apnea)   Chronic kidney disease, stage III (moderate)   Acute on chronic combined systolic and diastolic congestive heart failure, NYHA class 3   Possible reccuring Aspiration pneumonia, worsened by potential CVA   Small bowel obstruction   Renal failure, acute on chronic   Hyperkalemia  Plan:  Patient went into rapid HR while in Radiology.  HR is a lot better controlled now that he is back on BB and pain improved. The patient was on 25mg  oral lopressor prior to admission.  7.5 mg IV lopressor Q6hr.  Consider changing back to PO tomorrow.  CT Abd shows SBO pattern.   Avoiding CCB due to cardiomyopathy.     Net fluids:  -3.7L since adm.  Lasix DC'd and fluids added at 45ml/hr the other day.  Watch for signs of CHF.  Will need to restart PO lasix within a couple days.   LOS: 4 days    Richard Chavez 10/05/2012 9:38 AM  I have seen and evaluated the patient this AM along with [name], PA. I agree with his findings, examination as well as impression recommendations.  Looks much better this AM with NGT out & BMs yesterday -- but still hyperactive high pitch BS.  Pulled his IJ line last PM -- has h/o sundowning that is probably exacerbated by his CVA.  Discussed with Neurology -- for now Plavix for nex 3-4 days then can increase to full anticoagulation -- with need for close f/u, would prefer Warfarin (without bridging).  Remains in& out of Afib with PVCs & paced beats.  Plan is transfer to Acute Rehab early this week - can start while there.  For now, until we are sure that he is holding down PO,keep BB PO to avoid further withdrawal.  Convert to PO (Lopressor 50 mg BID)  tomorrow if SBO has resolved.  Agree with monitoring  volume status -- no active CHF.  Restart lasix on d/c.  MD Time with pt: 10 min  HARDING,[name] W, M.D., M.S. [facility] [address] [place], [place]  [address]  [phone] Pager # [phone] 10/05/2012 10:19 AM

## 2012-10-05 NOTE — Progress Notes (Signed)
Pt had IJ triple lumen catheter placed in the am, around 2:30pm pt pulled out IJ line and stated to pull out NG tube, NG tube advanced and re hooked up to suction, in attempt to get oob to use BM, IV nurse came and dressed site and placed new peripheral line, pt later in the evening then pulled out NG tube, Surgery notified and they said ok to leave out unless pt states to vomit, pt has had 4 BMs during the day

## 2012-10-05 NOTE — Progress Notes (Addendum)
Patient ID: Richard Chavez, male   DOB: 29-Mar-1927, 79 y.o.   MRN: [id number]    Subjective: Pt reports feeling good today although he does seem more confused today then yesterday, in soft mittens, pulled his NGT out and is pulling off his leads per nursing, he denies n/v/pain.  +multiple BMs  Objective: Vital signs in last 24 hours: Temp:  [98 F (36.7 C)-98.2 F (36.8 C)] 98.2 F (36.8 C) (06/01 0421) Pulse Rate:  [92-145] 92 (06/01 0421) Resp:  [20] 20 (06/01 0421) BP: (105-152)/(62-109) 112/79 mmHg (06/01 0421) SpO2:  [92 %-99 %] 95 % (06/01 0421) Weight:  [227 lb 8.2 oz (103.2 kg)] 227 lb 8.2 oz (103.2 kg) (06/01 0421) Last BM Date: 10/04/12  Intake/Output from previous day: 05/31 0701 - 06/01 0700 In: 1336.3 [P.O.:30; I.V.:606.3; IV Piggyback:700] Out: 1075 [Urine:1075] Intake/Output this shift:    PE: Abd: soft, nontender, +bs General: nad  Lab Results:   Recent Labs  10/03/12 0625 10/04/12 0530  WBC 14.5* 10.9*  HGB 14.5 14.1  HCT 44.7 41.5  PLT 206 232   BMET  Recent Labs  10/04/12 0530 10/05/12 0400  NA 139 137  K 3.8 4.0  CL 100 101  CO2 24 24  GLUCOSE 128* 107*  BUN 30* 35*  CREATININE 1.52* 1.35  CALCIUM 9.1 8.8   PT/INR No results found for this basename: LABPROT, INR,  in the last 72 hours CMP     Component Value Date/Time   NA 137 10/05/2012 0400   K 4.0 10/05/2012 0400   CL 101 10/05/2012 0400   CO2 24 10/05/2012 0400   GLUCOSE 107* 10/05/2012 0400   BUN 35* 10/05/2012 0400   CREATININE 1.35 10/05/2012 0400   CALCIUM 8.8 10/05/2012 0400   PROT 7.2 10/03/2012 0625   ALBUMIN 2.8* 10/03/2012 0625   AST 54* 10/03/2012 0625   ALT 13 10/03/2012 0625   ALKPHOS 61 10/03/2012 0625   BILITOT 1.6* 10/03/2012 0625   GFRNONAA 47* 10/05/2012 0400   GFRAA 54* 10/05/2012 0400   Lipase     Component Value Date/Time   LIPASE 22 06/30/2011 1842       Studies/Results: Ir Fluoro Guide Cv Line Right  10/04/2012   *RADIOLOGY REPORT*  Clinical data: Small bowel  obstruction, needs durable venous access.  RIGHT IJ CENTRAL VENOUS CATHETER PLACEMENT UNDER ULTRASOUND AND FLUOROSCOPIC GUIDANCE:  Technique and findings:  The procedure, risks (including but not limited to bleeding, infection, organ damage), benefits, and alternatives were explained to the the patient's son over the telephone.  Questions regarding the procedure were encouraged and answered.  The family member understands and consents to the procedure. Patency of the right IJ vein was confirmed with ultrasound with image documentation. An appropriate skin site was determined. Skin site was marked. Region was prepped using maximum barrier technique including cap and mask, sterile gown, sterile gloves, large sterile sheet, and Chlorhexidine   as cutaneous antisepsis.  The region was infiltrated locally with 1% lidocaine. Under real-time ultrasound guidance, the right IJ vein was accessed with a 21 gauge micropuncture needle; the needle tip within the vein was confirmed with ultrasound image documentation.  The needle exchanged over a guidewire for peel-away sheath through which an 17cm 5 [place] PowerPICC dual lumen catheter was advanced. This was positioned with the tip at the cavoatrial junction. Spot chest radiograph shows good positioning and no pneumothorax. Catheter was flushed and sutured externally with 0-Prolene sutures. Patient tolerated the procedure well, with no immediate  complication. Fluoroscopy time: 48 seconds  IMPRESSION: 1. Technically successful right IJ double lumen power injectable central venous catheter placement.   Original Report Authenticated By: D. Andria Rhein, MD   Ir US Guide Vasc Access Right  10/04/2012   *RADIOLOGY REPORT*  Clinical data: Small bowel obstruction, needs durable venous access.  RIGHT IJ CENTRAL VENOUS CATHETER PLACEMENT UNDER ULTRASOUND AND FLUOROSCOPIC GUIDANCE:  Technique and findings:  The procedure, risks (including but not limited to bleeding, infection, organ  damage), benefits, and alternatives were explained to the the patient's son over the telephone.  Questions regarding the procedure were encouraged and answered.  The family member understands and consents to the procedure. Patency of the right IJ vein was confirmed with ultrasound with image documentation. An appropriate skin site was determined. Skin site was marked. Region was prepped using maximum barrier technique including cap and mask, sterile gown, sterile gloves, large sterile sheet, and Chlorhexidine   as cutaneous antisepsis.  The region was infiltrated locally with 1% lidocaine. Under real-time ultrasound guidance, the right IJ vein was accessed with a 21 gauge micropuncture needle; the needle tip within the vein was confirmed with ultrasound image documentation.  The needle exchanged over a guidewire for peel-away sheath through which an 17cm 5 [place] PowerPICC dual lumen catheter was advanced. This was positioned with the tip at the cavoatrial junction. Spot chest radiograph shows good positioning and no pneumothorax. Catheter was flushed and sutured externally with 0-Prolene sutures. Patient tolerated the procedure well, with no immediate complication. Fluoroscopy time: 48 seconds  IMPRESSION: 1. Technically successful right IJ double lumen power injectable central venous catheter placement.   Original Report Authenticated By: D. Andria Rhein, MD   Dg Abd Portable 1v  10/05/2012   *RADIOLOGY REPORT*  Clinical Data: SBO  PORTABLE ABDOMEN - 1 VIEW  Comparison: 10/04/2012  Findings: Mildly prominent/dilated loops of small bowel in the mid abdomen, with paucity of colonic bowel gas, compatible with small bowel obstruction.  The caliber of some of the small bowel loops have mildly decreased when compared to the prior study.  IMPRESSION: Findings compatible with small bowel obstruction, although possibly mildly improved.   Original Report Authenticated By: Charline Bills, M.D.   Dg Abd Portable  1v  10/04/2012   *RADIOLOGY REPORT*  Clinical Data: Follow-up small bowel obstruction  PORTABLE ABDOMEN - 1 VIEW  Comparison: CT, 10/03/2012  Findings: Dilated loops of small bowel are noted in the central abdomen consistent with a partial small bowel obstruction and unchanged from the prior CT.  There are calcifications along the abdominal aorta and the iliac arteries.  Surgical clips are noted in the right upper quadrant from a prior cholecystectomy.  A nasogastric tube tip lies in the mid stomach.  The soft tissues are otherwise unremarkable.  IMPRESSION: Persistent small bowel obstruction, without significant change from the previous day's CT scan.   Original Report Authenticated By: Amie Portland, M.D.    Anti-infectives: Anti-infectives   Start     Dose/Rate Route Frequency Ordered Stop   10/03/12 2300  vancomycin (VANCOCIN) IVPB 1000 mg/200 mL premix     1,000 mg 200 mL/hr over 60 Minutes Intravenous Every 24 hours 10/03/12 1030     10/02/12 1500  vancomycin (VANCOCIN) 1,500 mg in sodium chloride 0.9 % 500 mL IVPB  Status:  Discontinued     1,500 mg 250 mL/hr over 120 Minutes Intravenous Every 24 hours 10/01/12 1245 10/03/12 1030   10/01/12 1330  vancomycin (VANCOCIN) 2,000 mg in sodium chloride  0.9 % 500 mL IVPB     2,000 mg 250 mL/hr over 120 Minutes Intravenous  Once 10/01/12 1244 10/01/12 1636   10/01/12 1300  piperacillin-tazobactam (ZOSYN) IVPB 3.375 g     3.375 g 12.5 mL/hr over 240 Minutes Intravenous 3 times per day 10/01/12 1245         Assessment/Plan SBO: although films consistent with persistent SBO clinically he seems to be resolving, will start clears this am, if does well could advance later today  LOS: 4 days    Dray Dente 10/05/2012 2

## 2012-10-06 ENCOUNTER — Inpatient Hospital Stay (HOSPITAL_COMMUNITY): Payer: Medicare Other

## 2012-10-06 LAB — BASIC METABOLIC PANEL
CO2: 26 mEq/L (ref 19–32)
Chloride: 101 mEq/L (ref 96–112)
GFR calc non Af Amer: 51 mL/min — ABNORMAL LOW (ref >90)
Glucose, Bld: 96 mg/dL (ref 70–99)
Potassium: 3 mEq/L — ABNORMAL LOW (ref 3.5–5.1)
Sodium: 138 mEq/L (ref 135–145)

## 2012-10-06 LAB — PROTIME-INR: INR: 1.1 (ref 0.00–1.49)

## 2012-10-06 MED ORDER — WARFARIN VIDEO
Freq: Once | Status: AC
  Administered 2012-10-06: 15:00:00

## 2012-10-06 MED ORDER — VANCOMYCIN HCL 10 G IV SOLR
1500.0000 mg | INTRAVENOUS | Status: DC
  Filled 2012-10-06: qty 1500

## 2012-10-06 MED ORDER — METOPROLOL TARTRATE 50 MG PO TABS
50.0000 mg | ORAL_TABLET | Freq: Two times a day (BID) | ORAL | Status: DC
  Administered 2012-10-06 – 2012-10-08 (×4): 50 mg via ORAL
  Filled 2012-10-06 (×5): qty 1

## 2012-10-06 MED ORDER — WARFARIN SODIUM 5 MG PO TABS
5.0000 mg | ORAL_TABLET | Freq: Once | ORAL | Status: AC
  Administered 2012-10-06: 5 mg via ORAL
  Filled 2012-10-06: qty 1

## 2012-10-06 MED ORDER — COUMADIN BOOK
Freq: Once | Status: AC
  Administered 2012-10-06: 15:00:00
  Filled 2012-10-06: qty 1

## 2012-10-06 MED ORDER — SODIUM CHLORIDE 0.9 % IJ SOLN
3.0000 mL | Freq: Two times a day (BID) | INTRAMUSCULAR | Status: DC
  Administered 2012-10-06 – 2012-10-08 (×4): 3 mL via INTRAVENOUS

## 2012-10-06 MED ORDER — WARFARIN - PHARMACIST DOSING INPATIENT
Freq: Every day | Status: DC
  Administered 2012-10-06: 18:00:00

## 2012-10-06 MED ORDER — POTASSIUM CHLORIDE CRYS ER 20 MEQ PO TBCR
40.0000 meq | EXTENDED_RELEASE_TABLET | Freq: Once | ORAL | Status: AC
  Administered 2012-10-06: 40 meq via ORAL
  Filled 2012-10-06: qty 2

## 2012-10-06 NOTE — Plan of Care (Signed)
Problem: Phase II Progression Outcomes Goal: Tolerating diet Outcome: Progressing Diet progressed from NPO except sips with meds to full liquid.  Tolerated b'fast and lunch without nausea or vomiting

## 2012-10-06 NOTE — Progress Notes (Signed)
Ate 100 % full liquid diet and tolerated without nausea or vomiting.

## 2012-10-06 NOTE — Progress Notes (Signed)
[facility]  Subjective: Pt denies chest pain and SOB. He is able to move the left upper extremity but it remains weaker than the opposite side. He reports producing BMs and passing flatus. He denies abdominal pain, n/v.   Objective: Vital signs in last 24 hours: Temp:  [97.9 F (36.6 C)-98 F (36.7 C)] 97.9 F (36.6 C) (06/02 0524) Pulse Rate:  [55-76] 73 (06/02 0524) Resp:  [20] 20 (06/02 0524) BP: (157-166)/(69-101) 160/69 mmHg (06/02 0524) SpO2:  [95 %-96 %] 95 % (06/02 0524) Weight:  [229 lb 0.9 oz (103.9 kg)] 229 lb 0.9 oz (103.9 kg) (06/02 0524) Last BM Date: 10/04/12  Intake/Output from previous day: 06/01 0701 - 06/02 0700 In: 550 [P.O.:200; IV Piggyback:350] Out: 806 [Urine:805; Stool:1] Intake/Output this shift:    Medications Current Facility-Administered Medications  Medication Dose Route Frequency Provider Last Rate Last Dose  . 0.9 %  sodium chloride infusion   Intravenous Continuous Elease Etienne, MD 50 mL/hr at 10/05/12 0946    . atorvastatin (LIPITOR) tablet 20 mg  20 mg Oral Q breakfast Rhetta Mura, MD   20 mg at 10/06/12 0905  . clopidogrel (PLAVIX) tablet 75 mg  75 mg Oral Q breakfast Elease Etienne, MD   75 mg at 10/06/12 0527  . docusate sodium (COLACE) capsule 100 mg  100 mg Oral Daily Marykay Lex, MD   100 mg at 10/05/12 1000  . enoxaparin (LOVENOX) injection 40 mg  40 mg Subcutaneous Q24H Layne Benton, NP   40 mg at 10/05/12 0944  . gabapentin (NEURONTIN) capsule 600 mg  600 mg Oral TID Rhetta Mura, MD   600 mg at 10/05/12 2210  . guaiFENesin (MUCINEX) 12 hr tablet 1,200 mg  1,200 mg Oral BID Rhetta Mura, MD   1,200 mg at 10/05/12 2209  . hydrocortisone sodium succinate (SOLU-CORTEF) 100 mg/2 mL injection 25 mg  25 mg Intravenous Q12H Elease Etienne, MD   25 mg at 10/05/12 1225  . Ipratropium-Albuterol (COMBIVENT) respimat 2 puff  2 puff Inhalation BID Rhetta Mura, MD   2 puff at  10/05/12 2130  . loratadine (CLARITIN) tablet 10 mg  10 mg Oral Q breakfast Rhetta Mura, MD   10 mg at 10/06/12 0850  . magnesium chloride (SLOW-MAG) 64 MG SR tablet 64 mg  1 tablet Oral BID Rhetta Mura, MD   64 mg at 10/05/12 2209  . magnesium hydroxide (MILK OF MAGNESIA) suspension 5 mL  5 mL Oral Daily PRN Gerome Apley Harduk, PA-C   5 mL at 10/03/12 0146  . metoprolol (LOPRESSOR) injection 7.5 mg  7.5 mg Intravenous Q6H Marykay Lex, MD   7.5 mg at 10/06/12 0527  . ondansetron (ZOFRAN) injection 4 mg  4 mg Intravenous Q8H PRN Gerome Apley Harduk, PA-C   4 mg at 10/02/12 2008  . piperacillin-tazobactam (ZOSYN) IVPB 3.375 g  3.375 g Intravenous Q8H Ann Held, RPH   3.375 g at 10/06/12 5784  . vancomycin (VANCOCIN) IVPB 1000 mg/200 mL premix  1,000 mg Intravenous Q24H Elease Etienne, MD   1,000 mg at 10/05/12 2209  . Vitamin D (Ergocalciferol) (DRISDOL) capsule 50,000 Units  50,000 Units Oral Q7 days Rhetta Mura, MD        PE: General appearance: alert, cooperative and no distress Neck: no JVD Lungs: bilateral rhonchi, no rales Heart: Irregularly Irregular Abdomen: mild distension, nontender Extremities: no LEE Pulses: 2+ and symmetric Skin: warm and dry Neurologic: Mental  status: Alert, oriented, thought content appropriate, pt able to flex the left upper extremity, but strength is weaker compared to contralateral side  Lab Results:   Recent Labs  10/04/12 0530  WBC 10.9*  HGB 14.1  HCT 41.5  PLT 232   BMET  Recent Labs  10/04/12 0530 10/05/12 0400 10/06/12 0514  NA 139 137 138  K 3.8 4.0 3.0*  CL 100 101 101  CO2 24 24 26   GLUCOSE 128* 107* 96  BUN 30* 35* 34*  CREATININE 1.52* 1.35 1.25  CALCIUM 9.1 8.8 8.9    Assessment/Plan  Principal Problem:   Stroke, lacunar Active Problems:   Hypertension   NSVT    COPD (chronic obstructive pulmonary disease)   CAD CABG 1998, patent grafts 06/06/11 after abn Myoview   Pacemaker, [employer] Jude,  implanted 2008   Cardiomyopathy, ischemic, EF 30-35% by recent cath   Chronic combined systolic and diastolic congestive heart failure   CKD (chronic kidney disease) stage 2, GFR 60-89 ml/min   OSA (obstructive sleep apnea)   Chronic kidney disease, stage III (moderate)   Acute on chronic combined systolic and diastolic congestive heart failure, NYHA class 3   Possible reccuring Aspiration pneumonia, worsened by potential CVA   Atrial fibrillation with RVR   Small bowel obstruction   Renal failure, acute on chronic   Hyperkalemia  Plan: Pt continues to have PAF and PVCs on telemetry. HR is controlled in the 80s-90s. He denies further abdominal pain/discomfort. No N/v. He had several BMs over the weekend and has been passing flatus. NGT is out. SBO appears to be improving. Surgery is ordering a f/u abdominal plain film today to reassess.  Pt will be advanced to a full clear liquid diet today. Per RN, he has been tolerating PO meds. He is currently on IV Lopressor. Will switch over to PO today. Will convert to 50 mg BID. Continue with Plavix for now. Per neurology, he will be transitioned to full anticoagulation in ~3 days. He is hypokalemic with K+ of 3.0. Will replete with supplemental K+.    LOS: 5 days   Brittainy M. Delmer Islam 10/06/2012 9:42 AM  I have seen and evaluated the patient this PM along with Boyce Medici, PA. I agree with her findings, examination as well as impression recommendations.  Overall seems to have stabilized -- SBO appears to be clinically resolved (taking PO) Continues to have irregular bursts of Afib, but no further NSVT -- BB converted to PO.  Is now moving his L arm more, but hand remains paralyzed.    Neurology has signed off & noted that he is OK for Coumadin; unfortunately, he now informs me that it would be too difficult to get back & forth to the clinic for INR checks, now that he can no longer drive (was told by Neurology that he can no longer  drive, & I tend to agree) --> I was under the understanding that he had transportation to get him to & fro, but apparently this is not the case --> this makes the thought of NOAC a better option.  Will need to clear with Neurology & get CM consult -- would prefer Eliquis for lower GI side effect (& GI bleed) concerns.    Otherwise no active HF Sx.    Continues of Abx for possible Asp PNA.    Pending Abx XR to reassess SBO.  Anticipate d/c to Acute Rehab in ~2 days.   MD Time with pt: 10 min  Marykay Lex, M.D., M.S. [facility] [address] [place], [place]  [address]  [phone] Pager # [phone] 10/06/2012 1:48 PM

## 2012-10-06 NOTE — Progress Notes (Signed)
Subjective: Pt with no pain moving bowels  No vomiting.  Objective: Vital signs in last 24 hours: Temp:  [97.9 F (36.6 C)-98 F (36.7 C)] 97.9 F (36.6 C) (06/02 0524) Pulse Rate:  [55-76] 73 (06/02 0524) Resp:  [20] 20 (06/02 0524) BP: (157-166)/(69-101) 160/69 mmHg (06/02 0524) SpO2:  [95 %-96 %] 95 % (06/02 0524) Weight:  [229 lb 0.9 oz (103.9 kg)] 229 lb 0.9 oz (103.9 kg) (06/02 0524) Last BM Date: 10/04/12  Intake/Output from previous day: 06/01 0701 - 06/02 0700 In: 550 [P.O.:200; IV Piggyback:350] Out: 806 [Urine:805; Stool:1] Intake/Output this shift:    GI: distended but non tender  Lab Results:   Recent Labs  10/04/12 0530  WBC 10.9*  HGB 14.1  HCT 41.5  PLT 232   BMET  Recent Labs  10/05/12 0400 10/06/12 0514  NA 137 138  K 4.0 3.0*  CL 101 101  CO2 24 26  GLUCOSE 107* 96  BUN 35* 34*  CREATININE 1.35 1.25  CALCIUM 8.8 8.9   PT/INR No results found for this basename: LABPROT, INR,  in the last 72 hours ABG No results found for this basename: PHART, PCO2, PO2, HCO3,  in the last 72 hours  Studies/Results: Ir Fluoro Guide Cv Line Right  10/04/2012   *RADIOLOGY REPORT*  Clinical data: Small bowel obstruction, needs durable venous access.  RIGHT IJ CENTRAL VENOUS CATHETER PLACEMENT UNDER ULTRASOUND AND FLUOROSCOPIC GUIDANCE:  Technique and findings:  The procedure, risks (including but not limited to bleeding, infection, organ damage), benefits, and alternatives were explained to the the patient's son over the telephone.  Questions regarding the procedure were encouraged and answered.  The family member understands and consents to the procedure. Patency of the right IJ vein was confirmed with ultrasound with image documentation. An appropriate skin site was determined. Skin site was marked. Region was prepped using maximum barrier technique including cap and mask, sterile gown, sterile gloves, large sterile sheet, and Chlorhexidine   as cutaneous  antisepsis.  The region was infiltrated locally with 1% lidocaine. Under real-time ultrasound guidance, the right IJ vein was accessed with a 21 gauge micropuncture needle; the needle tip within the vein was confirmed with ultrasound image documentation.  The needle exchanged over a guidewire for peel-away sheath through which an 17cm 5 [place] PowerPICC dual lumen catheter was advanced. This was positioned with the tip at the cavoatrial junction. Spot chest radiograph shows good positioning and no pneumothorax. Catheter was flushed and sutured externally with 0-Prolene sutures. Patient tolerated the procedure well, with no immediate complication. Fluoroscopy time: 48 seconds  IMPRESSION: 1. Technically successful right IJ double lumen power injectable central venous catheter placement.   Original Report Authenticated By: D. Andria Rhein, MD   Ir US Guide Vasc Access Right  10/04/2012   *RADIOLOGY REPORT*  Clinical data: Small bowel obstruction, needs durable venous access.  RIGHT IJ CENTRAL VENOUS CATHETER PLACEMENT UNDER ULTRASOUND AND FLUOROSCOPIC GUIDANCE:  Technique and findings:  The procedure, risks (including but not limited to bleeding, infection, organ damage), benefits, and alternatives were explained to the the patient's son over the telephone.  Questions regarding the procedure were encouraged and answered.  The family member understands and consents to the procedure. Patency of the right IJ vein was confirmed with ultrasound with image documentation. An appropriate skin site was determined. Skin site was marked. Region was prepped using maximum barrier technique including cap and mask, sterile gown, sterile gloves, large sterile sheet, and Chlorhexidine   as  cutaneous antisepsis.  The region was infiltrated locally with 1% lidocaine. Under real-time ultrasound guidance, the right IJ vein was accessed with a 21 gauge micropuncture needle; the needle tip within the vein was confirmed with ultrasound  image documentation.  The needle exchanged over a guidewire for peel-away sheath through which an 17cm 5 [place] PowerPICC dual lumen catheter was advanced. This was positioned with the tip at the cavoatrial junction. Spot chest radiograph shows good positioning and no pneumothorax. Catheter was flushed and sutured externally with 0-Prolene sutures. Patient tolerated the procedure well, with no immediate complication. Fluoroscopy time: 48 seconds  IMPRESSION: 1. Technically successful right IJ double lumen power injectable central venous catheter placement.   Original Report Authenticated By: D. Andria Rhein, MD   Dg Abd Portable 1v  10/05/2012   *RADIOLOGY REPORT*  Clinical Data: SBO  PORTABLE ABDOMEN - 1 VIEW  Comparison: 10/04/2012  Findings: Mildly prominent/dilated loops of small bowel in the mid abdomen, with paucity of colonic bowel gas, compatible with small bowel obstruction.  The caliber of some of the small bowel loops have mildly decreased when compared to the prior study.  IMPRESSION: Findings compatible with small bowel obstruction, although possibly mildly improved.   Original Report Authenticated By: Charline Bills, M.D.    Anti-infectives: Anti-infectives   Start     Dose/Rate Route Frequency Ordered Stop   10/03/12 2300  vancomycin (VANCOCIN) IVPB 1000 mg/200 mL premix     1,000 mg 200 mL/hr over 60 Minutes Intravenous Every 24 hours 10/03/12 1030     10/02/12 1500  vancomycin (VANCOCIN) 1,500 mg in sodium chloride 0.9 % 500 mL IVPB  Status:  Discontinued     1,500 mg 250 mL/hr over 120 Minutes Intravenous Every 24 hours 10/01/12 1245 10/03/12 1030   10/01/12 1330  vancomycin (VANCOCIN) 2,000 mg in sodium chloride 0.9 % 500 mL IVPB     2,000 mg 250 mL/hr over 120 Minutes Intravenous  Once 10/01/12 1244 10/01/12 1636   10/01/12 1300  piperacillin-tazobactam (ZOSYN) IVPB 3.375 g     3.375 g 12.5 mL/hr over 240 Minutes Intravenous 3 times per day 10/01/12 1245         Assessment/Plan: p SBO improving. Increase diet Moving bowels. KUB  LOS: 5 days    Creed Kail A. 10/06/2012

## 2012-10-06 NOTE — Progress Notes (Signed)
Speech Language Pathology Dysphagia Treatment Patient Details Name: Richard Chavez MRN: [id number] DOB: 1926/05/26 Today's Date: 10/06/2012 Time: 1547-1600 SLP Time Calculation (min): 13 min  Assessment / Plan / Recommendation Clinical Impression  Pt seen for check of diet tolerance now that he has again initiated POs. Currently on a clear liquid diet. Today pt with no evidence of aspiration. Encouraged upright posture which pt verbalized. Recommend that when solids are restarted, pt should consume a dys 2 diet as previously recommended. SLP will f/u and check for ability to advance texture when MD starts solids.     Diet Recommendation  Continue with Current Diet: Thin liquid    SLP Plan Continue with current plan of care   Pertinent Vitals/Pain NA   Swallowing Goals  SLP Swallowing Goals Patient will utilize recommended strategies during swallow to increase swallowing safety with: Minimal cueing Swallow Study Goal #2 - Progress: Progressing toward goal  General Temperature Spikes Noted: No Respiratory Status: Supplemental O2 delivered via (comment) Behavior/Cognition: Alert;Cooperative;Pleasant mood Oral Cavity - Dentition: Edentulous Patient Positioning: Upright in bed  Oral Cavity - Oral Hygiene Does patient have any of the following "at risk" factors?: Lips - dry, cracked;Oxygen therapy - cannula, mask, simple oxygen devices Patient is AT RISK - Oral Care Protocol followed (see row info): Yes   Dysphagia Treatment Treatment focused on: Skilled observation of diet tolerance Treatment Methods/Modalities: Skilled observation Patient observed directly with PO's: Yes Type of PO's observed: Thin liquids Feeding: Able to feed self Liquids provided via: Straw Type of cueing: Verbal Amount of cueing: Minimal   GO    Harlon Ditty, MA CCC-SLP [phone]  Claudine Mouton 10/06/2012, 4:12 PM

## 2012-10-06 NOTE — Progress Notes (Signed)
Met with patient at bedside to discuss possible CIR admission. Pt would benefit from inpatient rehab prior to returning home and he is in agreement with plan. Called pt's son to discuss discharge plans. Pt lives with his son who is available 24/7 to provide assistance as needed. [name] son is in agreement with plan for rehab. He reports that pt was independent PTA and driving. [name] son does not drive. They report that there is no one to drive them anywhere if  pt cannot drive. When pt was informed that he would not be able to drive, he was insistant that he will be driving when he returns home.  Noted that pt is now on clear liquids and will begin increasing diet today with KUB ordered. Will monitor pt's medical progress and await MD to advise when pt is medically stable for CIR.  For questions, call [phone].

## 2012-10-06 NOTE — Progress Notes (Signed)
Per CIR progress note 10/06/12- it appears that they will be able to admit patient pending stability.  SNF bed search as back up plan is in place. CSW will continue to monitor.  Lorri Frederick. [place]ugh  [phone]

## 2012-10-06 NOTE — Progress Notes (Signed)
PT Cancellation Note  Patient Details Name: Richard Chavez MRN: [id number] DOB: Jun 02, 1926   Cancelled Treatment:    Reason Eval/Treat Not Completed: Patient at procedure or test/unavailable    Verdell Face, PTA [phone] 10/06/2012

## 2012-10-06 NOTE — Progress Notes (Signed)
Stroke Team Progress Note  HISTORY Richard Chavez is an 79 y.o. male white male with PMH of CAD s/p CABG, CHF EF 30-35%, s/p PPM, HTN, and COPD presenting to the ED today 10/01/2012 alongside his son with complaints of worsening fatigue, generalized weakness, left arm weakness, SOB, fever, and chills. History is mainly obtained by son, [name], who explains that his father started complaining of left hand numbness yesterday morning (09/30/2012) ~11am. They went to the PCP office and returned home later that day thought to have numbness secondary to neuropathy. He was still moving his arm and able to drive and walk at that time, however, upon waking up this 10/02/28, he was confused, not moving his left arm at all, and had difficulty walking. Mr. Pickelsimer does not have any similar prior episodes or any history of CVA. His son does claim he takes an ASA daily, however, it is not on his medication list on epic and did not get any ASA this morning. Of note, he was recently discharged from the hospital on 09/17/12 for CAP on levofloxacin course.   Patient was not a TPA candidate secondary to delay in arrival. He was admitted for further evaluation and treatment.  SUBJECTIVE Patient without complaints.  OBJECTIVE Most recent Vital Signs: Filed Vitals:   10/05/12 2112 10/05/12 2130 10/06/12 0524 10/06/12 0952  BP: 166/101  160/69   Pulse: 55  73   Temp: 98 F (36.7 C)  97.9 F (36.6 C)   TempSrc: Oral  Oral   Resp: 20  20   Height:      Weight:   103.9 kg (229 lb 0.9 oz)   SpO2: 95% 95% 95% 100%   CBG (last 3)  No results found for this basename: GLUCAP,  in the last 72 hours  IV Fluid Intake:     MEDICATIONS  . atorvastatin  20 mg Oral Q breakfast  . clopidogrel  75 mg Oral Q breakfast  . docusate sodium  100 mg Oral Daily  . enoxaparin (LOVENOX) injection  40 mg Subcutaneous Q24H  . gabapentin  600 mg Oral TID  . guaiFENesin  1,200 mg Oral BID  . hydrocortisone sod succinate (SOLU-CORTEF) inj   25 mg Intravenous Q12H  . Ipratropium-Albuterol  2 puff Inhalation BID  . loratadine  10 mg Oral Q breakfast  . magnesium chloride  1 tablet Oral BID  . metoprolol  7.5 mg Intravenous Q6H  . piperacillin-tazobactam (ZOSYN)  IV  3.375 g Intravenous Q8H  . vancomycin  1,000 mg Intravenous Q24H  . Vitamin D (Ergocalciferol)  50,000 Units Oral Q7 days   PRN:    Diet:  Full Liquid Activity:  Up as tolerated DVT Prophylaxis:  SCDs   CLINICALLY SIGNIFICANT STUDIES Basic Metabolic Panel:   Recent Labs Lab 10/03/12 0625  10/05/12 0400 10/06/12 0514  NA 133*  < > 137 138  K 5.4*  < > 4.0 3.0*  CL 93*  < > 101 101  CO2 26  < > 24 26  GLUCOSE 99  < > 107* 96  BUN 27*  < > 35* 34*  CREATININE 1.99*  < > 1.35 1.25  CALCIUM 9.2  < > 8.8 8.9  MG 2.5  --   --   --   < > = values in this interval not displayed. Liver Function Tests:   Recent Labs Lab 10/02/12 0820 10/03/12 0625  AST 22 54*  ALT 11 13  ALKPHOS 77 61  BILITOT 2.0* 1.6*  PROT 6.3 7.2  ALBUMIN 2.8* 2.8*   CBC:   Recent Labs Lab 10/01/12 0846  10/03/12 0625 10/04/12 0530  WBC 8.8  < > 14.5* 10.9*  NEUTROABS 7.3  --   --   --   HGB 14.4  < > 14.5 14.1  HCT 43.6  < > 44.7 41.5  MCV 87.2  < > 87.3 85.4  PLT 210  < > 206 232  < > = values in this interval not displayed. Coagulation: No results found for this basename: LABPROT, INR,  in the last 168 hours Cardiac Enzymes:   Recent Labs Lab 10/01/12 0849  TROPONINI <0.30   Urinalysis:   Recent Labs Lab 10/01/12 0925  COLORURINE YELLOW  LABSPEC 1.015  PHURINE 7.0  GLUCOSEU NEGATIVE  HGBUR NEGATIVE  BILIRUBINUR NEGATIVE  KETONESUR NEGATIVE  PROTEINUR NEGATIVE  UROBILINOGEN 1.0  NITRITE NEGATIVE  LEUKOCYTESUR NEGATIVE   Lipid Panel     Component Value Date/Time   CHOL 92 10/02/2012 0820   TRIG 106 10/02/2012 0820   HDL 45 10/02/2012 0820   CHOLHDL 2.0 10/02/2012 0820   VLDL 21 10/02/2012 0820   LDLCALC 26 10/02/2012 0820    HgbA1C  Lab  Results  Component Value Date   HGBA1C 5.6 10/02/2012   LDL=26  Urine Drug Screen:   No results found for this basename: labopia,  cocainscrnur,  labbenz,  amphetmu,  thcu,  labbarb    Alcohol Level: No results found for this basename: ETH,  in the last 168 hours  CT of the brain  10/01/2012   1.  No acute intracranial abnormality 2.  Stable mild atrophy and chronic white matter ischemic changes 3.  Intracranial atherosclerosis                               10/02/2012 Cytotoxic edema and hypodensity in the vicinity of the inferior frontal gyrus and precentral gyrus on the right, compatible with subacute stroke. No hemorrhagic transformation observed  MRI of the brain    MRA of the brain    2D Echocardiogram  40-45%. No PFO or defect identified.  Carotid Doppler  Preliminary findings: Right = 40-59% ICA stenosis. Left = Borderline 60-79% ICA stenosis.  TCD  This was a normal transcranial Doppler study, with normal flow direction and velocity of all identified vessels of the anterior and posterior circulations, with no evidence of stenosis, vasospasm or occlusion. Low mean flow velocities in posterior circulation vessels are due to poor waveforms from suboptimal windows.    CXR  10/01/2012    Worsened aeration, especially on the right.  Suspicious for alveolar pulmonary edema versus multifocal infection.  Degraded exam, especially the lateral view.    EKG  sinus tachycardia.   Therapy Recommendations SNF  Physical Exam   Obese elderly Caucasian male in mild respiratory distress.Awake alert. Afebrile. Head is nontraumatic. Neck is supple without bruit. Hearing is normal. Cardiac exam no murmur or gallop. Lungs are clear to auscultation. Distal pulses are well felt.1 + pedal edema bilaterally. Neurological Exam ; Awake alert oriented x 3 normal speech and language. Mild left lower face asymmetry. Tongue midline. Mild LUE drift.LUE 4/5 weakness Mild diminished fine finger movements on left.  Orbits right over left upper extremity. Mild left grip weak.Marland KitchenLLE 4/5 weakness.Diminished left hemibody sensation . Normal coordination. Gait deferred.   ASSESSMENT Richard Chavez is a 79 y.o. male presenting with worsening fatigue, generalized weakness, left  arm weakness, SOB, fever, and chills. Initial CT imaging negative; unable to have MRI due to pacer. Suspect right brain infarct based on symptoms. Infarct felt to be thrombotic given location secondary to small vessel disease.  On aspirin 325 mg orally every day prior to admission. Now on clopidogrel 75 mg orally every day for secondary stroke prevention. Patient with resultant left facial weakness, left hemiparesis. Work up underway.  A-fib with RVR Hypertension Ctotoxic edema and hypodensity in the vicinity of the inferior frontal gyrus and precentral gyrus on the right, compatible with subacute stroke Hyperlipidemia, LDL pending, on lipitor 20 mg daily PTA, now on lipitor 20 mg daily, goal LDL < 100 (< 70 for diabetics) CAD - CABG 1978, pacemaker, MI OSA PVD Ischemic cardiomyopathy EF 35-40% COPD LDL 26 hgba1c 5.6 Right = 40-98% ICA stenosis. Left = Borderline 60-79% ICA stenosis. SBO improved started on clear diet  Hospital day # 5  TREATMENT/PLAN  A-fib with RVR that is paroxysmal. Ok to start coumadin from stroke standpoint.    CIR at time of discharge.  No further stroke workup indicated. Patient has a 10-15% risk of having another stroke over the next year, the highest risk is within 2 weeks of the most recent stroke/TIA (risk of having a stroke following a stroke or TIA is the same). Ongoing risk factor control by Primary Care Physician Stroke Service will sign off. Please call should any needs arise. Follow up with Dr. Pearlean Brownie, Stroke Clinic, in 2 months.  Annie Main, MSN, RN, ANVP-BC, ANP-BC, [facility] Pager: [phone] 10/06/2012 9:57 AM  I have personally obtained a history, examined the  patient, evaluated imaging results, and formulated the assessment and plan of care. I agree with the above.  Delia Heady, MD

## 2012-10-06 NOTE — Progress Notes (Addendum)
ANTICOAGULATION CONSULT NOTE - Initial Consult  Pharmacy Consult for warfarin Indication: atrial fibrillation  No Known Allergies  Patient Measurements: Height: 6\' 2"  (188 cm) Weight: 229 lb 0.9 oz (103.9 kg) IBW/kg (Calculated) : 82.2  Vital Signs: Temp: 97.5 F (36.4 C) (06/02 1050) Temp src: Oral (06/02 1050) BP: 144/61 mmHg (06/02 1050) Pulse Rate: 60 (06/02 1050)  Labs:  Recent Labs  10/04/12 0530 10/05/12 0400 10/06/12 0514  HGB 14.1  --   --   HCT 41.5  --   --   PLT 232  --   --   CREATININE 1.52* 1.35 1.25    Estimated Creatinine Clearance: 57.6 ml/min (by C-G formula based on Cr of 1.25).   Medical History: Past Medical History  Diagnosis Date  . CAD (coronary artery disease), native coronary artery     Status post CABG  . Hypertension   . Hyperlipidemia   . GERD (gastroesophageal reflux disease)   . COPD (chronic obstructive pulmonary disease)   . CRF (chronic renal failure)   . Barrett's esophagus   . OP (osteoporosis)   . CAD (coronary artery disease) of artery bypass graft 07/04/2011    LIMA-LAD; SVG-OM2-OM3, SVG-RCA -- all patent as of 05/27/11  . Syncope 07/04/2011  . Pacemaker 07/04/2011  . Cardiomyopathy, ischemic 07/07/2011    2D Echo - EF 35-40, moderately dilated left atrium  . Myocardial infarction     25 yrs ago  . Peripheral vascular disease   . Arthritis   . BPH (benign prostatic hyperplasia)   . Polyp of colon     removed  . Sleep apnea     STOP BANG SCORE 4  . AV block, 3rd degree     Post St. Jude pacemaker, EF 35-40, does have intermittent atrial tachycardia, either PAt or Afib  . BPH (benign prostatic hypertrophy) 11/15/2011  . SOB (shortness of breath) on exertion 02/14/2010    2D Echo - EF 35-45,     Medications:  Prescriptions prior to admission  Medication Sig Dispense Refill  . albuterol-ipratropium (COMBIVENT) 18-103 MCG/ACT inhaler Inhale 2 puffs into the lungs 2 (two) times daily.       Marland Kitchen atorvastatin (LIPITOR) 20  MG tablet Take 20 mg by mouth daily with breakfast.       . fluticasone (FLONASE) 50 MCG/ACT nasal spray Place 2 sprays into the nose 2 (two) times daily as needed. For dryness.      . Fluticasone-Salmeterol (ADVAIR DISKUS) 500-50 MCG/DOSE AEPB Inhale 1 puff into the lungs every 12 (twelve) hours.       . furosemide (LASIX) 40 MG tablet Take 1 tablet (40 mg total) by mouth daily with breakfast.  30 tablet    . gabapentin (NEURONTIN) 300 MG capsule Take 600 mg by mouth 3 (three) times daily.       Marland Kitchen guaiFENesin (MUCINEX) 600 MG 12 hr tablet Take 1,200 mg by mouth 2 (two) times daily.      Marland Kitchen loratadine (CLARITIN) 10 MG tablet Take 10 mg by mouth daily with breakfast.       . Magnesium Chloride (SLOW-MAG) 535 (64 MG) MG TBCR Take 1 tablet by mouth 2 (two) times daily.       . metoprolol tartrate (LOPRESSOR) 25 MG tablet Take 25 mg by mouth 2 (two) times daily.      . Omega-3 Fatty Acids (FISH OIL) 1000 MG CAPS Take 1 capsule by mouth daily.       . potassium chloride (K-DUR) 10  MEQ tablet Take 10 mEq by mouth 2 (two) times daily.       . predniSONE (DELTASONE) 5 MG tablet Take 5 mg by mouth daily.      . ranitidine (ZANTAC) 150 MG capsule Take 150 mg by mouth 2 (two) times daily.       Marland Kitchen tiotropium (SPIRIVA) 18 MCG inhalation capsule Place 18 mcg into inhaler and inhale daily with breakfast.       . Vitamin D, Ergocalciferol, (DRISDOL) 50000 UNITS CAPS Take 50,000 Units by mouth every 7 (seven) days. Take on Fridays      . vitamin E 1000 UNIT capsule Take 1,000 Units by mouth daily with breakfast.       . levofloxacin (LEVAQUIN) 750 MG tablet Take 1 tablet (750 mg total) by mouth every other day. Start on 09/18/2012.  2 tablet  0   Assessment: 85 yom admitted with worsening fatigue, generalized weakness and left arm weakness. To start coumadin today for afib. Baseline INR is WNL. He is also on lovenox for VTE prophylaxis.   Goal of Therapy:  INR 2-3   Plan:  1. Warfarin 5mg  PO x 1 tonight 2.  Daily INR and INR now for baseline 3. Coumadin book + video to patient  Karolyne Timmons, Drake Leach 10/06/2012,1:33 PM

## 2012-10-06 NOTE — Progress Notes (Addendum)
Unsure if I scanned pt before giving 8am Lipitor 20mg .  Pt identification per arm band and confirmation of name and birthday, and MRN # done.

## 2012-10-06 NOTE — Progress Notes (Signed)
[facility] PROGRESS NOTE  Richard Chavez ZOX:[id number] DOB: [birth year] DOA: 10/01/2012 PCP: Thayer Headings, MD  Brief narrative 79 year old male with extensive past medical history including CAD, CABG, ischemic cardiomyopathy, EF 35-40% 3/13, PPM, chronic combined systolic/diastolic CHF HTN, HL, GERD, COPD, chronic kidney disease, recent hospitalization 5/12-5/14 for community-acquired pneumonia presented on 5/28 with complaints of numbness in left hand, chills, weakness, mild productive sputum, fevers, mild SOB and leg edema. In the ED, chest x-ray suggestive of pulmonary edema, ABG showed pH of of 72%, creatinine 1.42, proBNP 1059. Hospitalist admission requested.    Assessment/Plan: 1. Subacute right brain infarct: Initial CT imaging was negative but subsequent one showed subacute stroke. Unable to do MRI secondary to pacer. His CVA could be embolic from A. fib-neurology initially recommended holding off anticoagulation for 7-14 days post stroke but stroke M.D. today recommend starting anticoagulation right away. Complete stroke workup. Continue Plavix until patient consistently taking by mouth, then switch to  Eliquis. 2. Small bowel obstruction: Unclear etiology. Surgery following. Patient had 5 BMs on 5/31 and a BM on 6/1. Abdominal distention better although x-rays still show SBO. Surgeons following. Diet advanced to full liquids. Monitor 3. Acute on on chronic kidney disease stage III: Likely precipitated by diuresis. Hold Lasix. Treat with IV fluids and follow BMP. Improving-reduce IV fluids. 4. Hyperkalemia: Secondary to acute renal failure and potassium supplements. Hold potassium supplements. IV fluids and resolved. 5. A. fib with RVR and NSVT: Management per cardiology. Beta blockers will be changed back to by mouth. Anticoagulation as indicated in problem #1. 6. Shortness of breath-multifactorial-potentially aspiration pneumonia +/-CHF exacerbation-patient's dry weight is 233  pounds. Continue daily weights, strict in and out. Clinically dry and patient denies dyspnea. Brief IV fluids and hold Lasix. Compensated. 3. Potential aspiration pneumonia : Agree with empiric Vancomycin/Zosyn for now. CT abdomen showed right middle lobe and lower lobe opacities. Will need followup imaging to ensure resolution. Will get speech therapist input to rule out overt oropharyngeal dysphagia. Oxygen and nebs to continue as needed. We'll consider switching to oral Augmentin when tolerating by mouth well. 4. History of CAD/cardiomyopathy/CHF NYHA class II- cardiology input appreciated. 5. COPD history-continue Combivent inhaler 2 puff twice a day. Continue home dose prednisone 5 mg tablet-changed to IV hydrocortisone secondary to n.p.o.. Continue tiotropium 18 mcg daily every morning. COPD appears to be at baseline. No wheezing. We'll change back to oral prednisone when tolerating diet consistently. 6. Hypotension-seems like a chronic issue.  resolved.  7. Hyperlipidemia continue Lipitor 20 mg daily with breakfast. Lipid panel stable. 8. Confusion: Possibly from acute illness-improving. 9. Hypokalemia: Replete and follow BMP   Code Status: Full Family Communication: Discussed with patient's son Mr. Alistar Mcenery on 5/31. Disposition Plan: Remains inpatient.   Consultants:  Cardiology  Neurology  Rehab MD  General surgery  Procedures:  None  Antibiotics:  Vancomycin  Zosyn   HPI/Subjective: Asking for something to eat this morning. Had a BM on 6/1 PM. Denies abdominal pain. No chest pain or dyspnea.  Objective: Filed Vitals:   10/06/12 0524 10/06/12 0952 10/06/12 1050 10/06/12 1341  BP: 160/69  144/61 148/67  Pulse: 73  60 63  Temp: 97.9 F (36.6 C)  97.5 F (36.4 C)   TempSrc: Oral  Oral   Resp: 20  20 20   Height:      Weight: 103.9 kg (229 lb 0.9 oz)     SpO2: 95% 100% 98% 96%    Intake/Output Summary (Last 24 hours) at 10/06/12 1805  Last data filed at  10/06/12 1341  Gross per 24 hour  Intake   1565 ml  Output    651 ml  Net    914 ml   Filed Weights   10/04/12 0502 10/05/12 0421 10/06/12 0524  Weight: 103.375 kg (227 lb 14.4 oz) 103.2 kg (227 lb 8.2 oz) 103.9 kg (229 lb 0.9 oz)    Exam:   General exam: Comfortable. Mucosa dry but better.  Respiratory system:  reduced breath sounds in the bases but otherwise clear to auscultation . No increased work of breathing. Midline sternotomy scar.  Cardiovascular system: S1 & S2 heard,  irregular . No JVD, murmurs, gallops, clicks or pedal edema. Telemetry: A. fib with CVR  Gastrointestinal system: Abdomen is less distended, soft and nontender.  Normal bowel sounds heard.  Central nervous system: Alert and oriented x3 . No focal neurological deficits.  Extremities: Symmetric 5 x 5 power.   Data Reviewed: Basic Metabolic Panel:  Recent Labs Lab 10/02/12 0820 10/03/12 0625 10/04/12 0530 10/05/12 0400 10/06/12 0514  NA 136 133* 139 137 138  K 3.7 5.4* 3.8 4.0 3.0*  CL 100 93* 100 101 101  CO2 21 26 24 24 26   GLUCOSE 78 99 128* 107* 96  BUN 27* 27* 30* 35* 34*  CREATININE 2.11* 1.99* 1.52* 1.35 1.25  CALCIUM 9.0 9.2 9.1 8.8 8.9  MG  --  2.5  --   --   --    Liver Function Tests:  Recent Labs Lab 10/01/12 0846 10/02/12 0820 10/03/12 0625  AST 14 22 54*  ALT 12 11 13   ALKPHOS 59 77 61  BILITOT 0.9 2.0* 1.6*  PROT 6.6 6.3 7.2  ALBUMIN 3.1* 2.8* 2.8*   No results found for this basename: LIPASE, AMYLASE,  in the last 168 hours No results found for this basename: AMMONIA,  in the last 168 hours CBC:  Recent Labs Lab 10/01/12 0846 10/02/12 0510 10/03/12 0625 10/04/12 0530  WBC 8.8 16.2* 14.5* 10.9*  NEUTROABS 7.3  --   --   --   HGB 14.4 13.3 14.5 14.1  HCT 43.6 38.4* 44.7 41.5  MCV 87.2 86.7 87.3 85.4  PLT 210 PLATELET CLUMPS NOTED ON SMEAR, COUNT APPEARS ADEQUATE 206 232   Cardiac Enzymes:  Recent Labs Lab 10/01/12 0849  TROPONINI <0.30   BNP (last  3 results)  Recent Labs  09/15/12 1130 10/01/12 0849 10/03/12 0625  PROBNP 1760.0* 1059.0* 993.5*   CBG: No results found for this basename: GLUCAP,  in the last 168 hours  Recent Results (from the past 240 hour(s))  CULTURE, BLOOD (ROUTINE X 2)     Status: None   Collection Time    10/01/12  1:49 PM      Result Value Range Status   Specimen Description BLOOD RIGHT FOREARM   Final   Special Requests BOTTLES DRAWN AEROBIC ONLY 6CC   Final   Culture  Setup Time 10/01/2012 17:12   Final   Culture     Final   Value:        BLOOD CULTURE RECEIVED NO GROWTH TO DATE CULTURE WILL BE HELD FOR 5 DAYS BEFORE ISSUING A FINAL NEGATIVE REPORT   Report Status PENDING   Incomplete  CULTURE, BLOOD (ROUTINE X 2)     Status: None   Collection Time    10/01/12  1:55 PM      Result Value Range Status   Specimen Description BLOOD THUMB RIGHT   Final  Special Requests BOTTLES DRAWN AEROBIC ONLY 5CC   Final   Culture  Setup Time 10/01/2012 17:12   Final   Culture     Final   Value:        BLOOD CULTURE RECEIVED NO GROWTH TO DATE CULTURE WILL BE HELD FOR 5 DAYS BEFORE ISSUING A FINAL NEGATIVE REPORT   Report Status PENDING   Incomplete  MRSA PCR SCREENING     Status: None   Collection Time    10/01/12  4:29 PM      Result Value Range Status   MRSA by PCR NEGATIVE  NEGATIVE Final   Comment:            The GeneXpert MRSA Assay (FDA     approved for NASAL specimens     only), is one component of a     comprehensive MRSA colonization     surveillance program. It is not     intended to diagnose MRSA     infection nor to guide or     monitor treatment for     MRSA infections.     Studies: Dg Abd 1 View  10/06/2012   *RADIOLOGY REPORT*  Clinical Data: Follow-up small bowel obstruction.  ABDOMEN - 1 VIEW  Comparison: 10/05/2012  Findings: There are loops of dilated small bowel without colonic distention, findings without substantial change from the prior exam allowing for differences in  radiographic technique.  The findings remain consistent with a partial small bowel obstruction.  No gross free air on the supine study.  There is atherosclerotic calcification along the abdominal aorta and iliac arteries and evidence of a small fusiform infrarenal abdominal aortic aneurysm measuring approximate 3.6 cm in diameter.  IMPRESSION: Persistent partial small bowel obstruction.   Original Report Authenticated By: Amie Portland, M.D.   Dg Abd Portable 1v  10/05/2012   *RADIOLOGY REPORT*  Clinical Data: SBO  PORTABLE ABDOMEN - 1 VIEW  Comparison: 10/04/2012  Findings: Mildly prominent/dilated loops of small bowel in the mid abdomen, with paucity of colonic bowel gas, compatible with small bowel obstruction.  The caliber of some of the small bowel loops have mildly decreased when compared to the prior study.  IMPRESSION: Findings compatible with small bowel obstruction, although possibly mildly improved.   Original Report Authenticated By: Charline Bills, M.D.     Additional labs:   Scheduled Meds: . atorvastatin  20 mg Oral Q breakfast  . docusate sodium  100 mg Oral Daily  . enoxaparin (LOVENOX) injection  40 mg Subcutaneous Q24H  . gabapentin  600 mg Oral TID  . guaiFENesin  1,200 mg Oral BID  . hydrocortisone sod succinate (SOLU-CORTEF) inj  25 mg Intravenous Q12H  . Ipratropium-Albuterol  2 puff Inhalation BID  . loratadine  10 mg Oral Q breakfast  . magnesium chloride  1 tablet Oral BID  . metoprolol tartrate  50 mg Oral BID  . piperacillin-tazobactam (ZOSYN)  IV  3.375 g Intravenous Q8H  . sodium chloride  3 mL Intravenous Q12H  . vancomycin  1,500 mg Intravenous Q24H  . Vitamin D (Ergocalciferol)  50,000 Units Oral Q7 days  . Warfarin - Pharmacist Dosing Inpatient   Does not apply q1800   Continuous Infusions:    Principal Problem:   Stroke, lacunar Active Problems:   Hypertension   NSVT    COPD (chronic obstructive pulmonary disease)   CAD CABG 1998, patent grafts  06/06/11 after abn Myoview   Pacemaker, St Jude, implanted 2008  Cardiomyopathy, ischemic, EF 30-35% by recent cath   Chronic combined systolic and diastolic congestive heart failure   CKD (chronic kidney disease) stage 2, GFR 60-89 ml/min   OSA (obstructive sleep apnea)   Chronic kidney disease, stage III (moderate)   Acute on chronic combined systolic and diastolic congestive heart failure, NYHA class 3   Possible reccuring Aspiration pneumonia, worsened by potential CVA   Atrial fibrillation with RVR   Small bowel obstruction   Renal failure, acute on chronic   Hyperkalemia    Time spent:  30 minutes     [facility]  [facility] Pager [phone].   If 8PM-8AM, please contact night-coverage at [email or web address], password [facility] 10/06/2012, 6:05 PM  LOS: 5 days

## 2012-10-06 NOTE — Progress Notes (Signed)
ANTIBIOTIC CONSULT NOTE - Follow-up  Pharmacy Consult for Vancomycin + Zosyn Indication: rule out pneumonia  No Known Allergies  Patient Measurements: Height: 6\' 2"  (188 cm) Weight: 229 lb 0.9 oz (103.9 kg) IBW/kg (Calculated) : 82.2  Vital Signs: Temp: 97.5 F (36.4 C) (06/02 1050) Temp src: Oral (06/02 1050) BP: 148/67 mmHg (06/02 1341) Pulse Rate: 63 (06/02 1341) Intake/Output from previous day: 06/01 0701 - 06/02 0700 In: 600 [P.O.:200; IV Piggyback:400] Out: 806 [Urine:805; Stool:1] Intake/Output from this shift: Total I/O In: 945 [P.O.:720; I.V.:175; IV Piggyback:50] Out: 300 [Urine:300]  Labs:  Recent Labs  10/04/12 0530 10/05/12 0400 10/06/12 0514  WBC 10.9*  --   --   HGB 14.1  --   --   PLT 232  --   --   CREATININE 1.52* 1.35 1.25   Estimated Creatinine Clearance: 57.6 ml/min (by C-G formula based on Cr of 1.25). No results found for this basename: VANCOTROUGH, Leodis Binet, VANCORANDOM, GENTTROUGH, GENTPEAK, GENTRANDOM, TOBRATROUGH, TOBRAPEAK, TOBRARND, AMIKACINPEAK, AMIKACINTROU, AMIKACIN,  in the last 72 hours   Microbiology: Recent Results (from the past 720 hour(s))  CULTURE, BLOOD (ROUTINE X 2)     Status: None   Collection Time    09/15/12  1:40 PM      Result Value Range Status   Specimen Description BLOOD LEFT ANTECUBITAL   Final   Special Requests BOTTLES DRAWN AEROBIC AND ANAEROBIC 3CC   Final   Culture  Setup Time 09/15/2012 22:38   Final   Culture NO GROWTH 5 DAYS   Final   Report Status 09/21/2012 FINAL   Final  CULTURE, BLOOD (ROUTINE X 2)     Status: None   Collection Time    09/15/12  1:45 PM      Result Value Range Status   Specimen Description BLOOD LEFT FOREARM   Final   Special Requests BOTTLES DRAWN AEROBIC ONLY 5CC   Final   Culture  Setup Time 09/15/2012 22:38   Final   Culture NO GROWTH 5 DAYS   Final   Report Status 09/21/2012 FINAL   Final  URINE CULTURE     Status: None   Collection Time    09/15/12  2:42 PM   Result Value Range Status   Specimen Description URINE, CATHETERIZED   Final   Special Requests NONE   Final   Culture  Setup Time 09/15/2012 23:04   Final   Colony Count NO GROWTH   Final   Culture NO GROWTH   Final   Report Status 09/16/2012 FINAL   Final  MRSA PCR SCREENING     Status: None   Collection Time    09/16/12  8:02 AM      Result Value Range Status   MRSA by PCR NEGATIVE  NEGATIVE Final   Comment:            The GeneXpert MRSA Assay (FDA     approved for NASAL specimens     only), is one component of a     comprehensive MRSA colonization     surveillance program. It is not     intended to diagnose MRSA     infection nor to guide or     monitor treatment for     MRSA infections.  CULTURE, EXPECTORATED SPUTUM-ASSESSMENT     Status: None   Collection Time    09/16/12 10:08 AM      Result Value Range Status   Specimen Description SPUTUM   Final  Special Requests NONE   Final   Sputum evaluation     Final   Value: THIS SPECIMEN IS ACCEPTABLE. RESPIRATORY CULTURE REPORT TO FOLLOW.   Report Status 09/16/2012 FINAL   Final  CULTURE, RESPIRATORY (NON-EXPECTORATED)     Status: None   Collection Time    09/16/12 10:08 AM      Result Value Range Status   Specimen Description SPUTUM   Final   Special Requests NONE   Final   Gram Stain     Final   Value: NO WBC SEEN     ABUNDANT SQUAMOUS EPITHELIAL CELLS PRESENT     FEW YEAST   Culture     Final   Value: FEW CANDIDA ALBICANS     Note: MICROSCOPIC FINDINGS SUGGEST THAT THIS SPECIMEN IS NOT REPRESENTATIVE OF LOWER RESPIRATORY SECRETIONS. PLEASE RECOLLECT.   Report Status 09/19/2012 FINAL   Final  CULTURE, BLOOD (ROUTINE X 2)     Status: None   Collection Time    10/01/12  1:49 PM      Result Value Range Status   Specimen Description BLOOD RIGHT FOREARM   Final   Special Requests BOTTLES DRAWN AEROBIC ONLY 6CC   Final   Culture  Setup Time 10/01/2012 17:12   Final   Culture     Final   Value:        BLOOD CULTURE  RECEIVED NO GROWTH TO DATE CULTURE WILL BE HELD FOR 5 DAYS BEFORE ISSUING A FINAL NEGATIVE REPORT   Report Status PENDING   Incomplete  CULTURE, BLOOD (ROUTINE X 2)     Status: None   Collection Time    10/01/12  1:55 PM      Result Value Range Status   Specimen Description BLOOD THUMB RIGHT   Final   Special Requests BOTTLES DRAWN AEROBIC ONLY 5CC   Final   Culture  Setup Time 10/01/2012 17:12   Final   Culture     Final   Value:        BLOOD CULTURE RECEIVED NO GROWTH TO DATE CULTURE WILL BE HELD FOR 5 DAYS BEFORE ISSUING A FINAL NEGATIVE REPORT   Report Status PENDING   Incomplete  MRSA PCR SCREENING     Status: None   Collection Time    10/01/12  4:29 PM      Result Value Range Status   MRSA by PCR NEGATIVE  NEGATIVE Final   Comment:            The GeneXpert MRSA Assay (FDA     approved for NASAL specimens     only), is one component of a     comprehensive MRSA colonization     surveillance program. It is not     intended to diagnose MRSA     infection nor to guide or     monitor treatment for     MRSA infections.    Assessment: 79 y.o. M recently hospitalized for treatment of CAP from 5/12 to 5/14 who re-presented to the [facility] on 5/28 with SOB/weakness. Pt continues on vancomycin + zosyn D#6 for pneumonia. Renal function has improved. Cultures are negative to date.   Goal of Therapy:  Vancomycin trough level 15-20 mcg/ml Proper antibiotics for infection/cultures adjusted for renal/hepatic function   Plan:  1. Increase vancomycin to 1500mg  IV Q24H 2. Continue zosyn 3.375gm IV Q8H (4 hr inf) 3. F/u renal fxn, C&S, clinical status and trough at SS 4. MD - Please address intended LOT  for antibiotics  Lysle Pearl, PharmD, BCPS Pager # [phone] 10/06/2012 3:20 PM

## 2012-10-06 NOTE — Progress Notes (Signed)
10/06/12 1615 Noted CM referral for benefits check for Eliquis.  Pt. co-pay will be $25/30 day supply. Tera Mater, RN, BSN NCM [phone]

## 2012-10-06 NOTE — Progress Notes (Signed)
OT Cancellation Note  Patient Details Name: Richard Chavez MRN: [id number] DOB: April 06, 1927   Cancelled Treatment:    Reason Eval/Treat Not Completed: Patient at procedure or test/ unavailable  Evette Georges [phone] 10/06/2012, 11:32 AM

## 2012-10-07 DIAGNOSIS — I472 Ventricular tachycardia: Secondary | ICD-10-CM

## 2012-10-07 DIAGNOSIS — I10 Essential (primary) hypertension: Secondary | ICD-10-CM

## 2012-10-07 LAB — BASIC METABOLIC PANEL
CO2: 26 mEq/L (ref 19–32)
Calcium: 8.7 mg/dL (ref 8.4–10.5)
GFR calc non Af Amer: 50 mL/min — ABNORMAL LOW (ref >90)
Glucose, Bld: 103 mg/dL — ABNORMAL HIGH (ref 70–99)
Potassium: 2.9 mEq/L — ABNORMAL LOW (ref 3.5–5.1)
Sodium: 138 mEq/L (ref 135–145)

## 2012-10-07 LAB — PROTIME-INR
INR: 1.11 (ref 0.00–1.49)
Prothrombin Time: 14.2 seconds (ref 11.6–15.2)

## 2012-10-07 LAB — CULTURE, BLOOD (ROUTINE X 2): Culture: NO GROWTH

## 2012-10-07 MED ORDER — APIXABAN 5 MG PO TABS
5.0000 mg | ORAL_TABLET | Freq: Two times a day (BID) | ORAL | Status: DC
  Administered 2012-10-07 – 2012-10-08 (×3): 5 mg via ORAL
  Filled 2012-10-07 (×4): qty 1

## 2012-10-07 MED ORDER — POTASSIUM CHLORIDE CRYS ER 20 MEQ PO TBCR
40.0000 meq | EXTENDED_RELEASE_TABLET | ORAL | Status: AC
  Administered 2012-10-07 (×2): 40 meq via ORAL
  Filled 2012-10-07 (×2): qty 2

## 2012-10-07 NOTE — Progress Notes (Signed)
Patient ID: Richard Chavez, male   DOB: 02/25/1927, 79 y.o.   MRN: [id number]    Subjective: No complaints, denies n/v/pain, hungy.  Objective: Vital signs in last 24 hours: Temp:  [97.3 F (36.3 C)-97.7 F (36.5 C)] 97.3 F (36.3 C) (06/03 0529) Pulse Rate:  [59-86] 59 (06/03 0529) Resp:  [18-20] 18 (06/03 0529) BP: (121-148)/(60-83) 136/83 mmHg (06/03 0529) SpO2:  [95 %-100 %] 95 % (06/03 0529) FiO2 (%):  [21 %] 21 % (06/02 0952) Weight:  [233 lb 7.5 oz (105.9 kg)] 233 lb 7.5 oz (105.9 kg) (06/03 0529) Last BM Date: 10/06/12  Intake/Output from previous day: 06/02 0701 - 06/03 0700 In: 1065 [P.O.:840; I.V.:175; IV Piggyback:50] Out: 550 [Urine:550] Intake/Output this shift:   General: NAD GI: overall soft but obese, +BS, nontender to palp  Lab Results:  No results found for this basename: WBC, HGB, HCT, PLT,  in the last 72 hours BMET  Recent Labs  10/06/12 0514 10/07/12 0530  NA 138 138  K 3.0* 2.9*  CL 101 103  CO2 26 26  GLUCOSE 96 103*  BUN 34* 27*  CREATININE 1.25 1.29  CALCIUM 8.9 8.7   PT/INR  Recent Labs  10/06/12 1418 10/07/12 0530  LABPROT 14.1 14.2  INR 1.10 1.11   ABG No results found for this basename: PHART, PCO2, PO2, HCO3,  in the last 72 hours  Studies/Results: Dg Abd 1 View  10/06/2012   *RADIOLOGY REPORT*  Clinical Data: Follow-up small bowel obstruction.  ABDOMEN - 1 VIEW  Comparison: 10/05/2012  Findings: There are loops of dilated small bowel without colonic distention, findings without substantial change from the prior exam allowing for differences in radiographic technique.  The findings remain consistent with a partial small bowel obstruction.  No gross free air on the supine study.  There is atherosclerotic calcification along the abdominal aorta and iliac arteries and evidence of a small fusiform infrarenal abdominal aortic aneurysm measuring approximate 3.6 cm in diameter.  IMPRESSION: Persistent partial small bowel obstruction.    Original Report Authenticated By: Amie Portland, M.D.    Anti-infectives: Anti-infectives   Start     Dose/Rate Route Frequency Ordered Stop   10/06/12 2100  vancomycin (VANCOCIN) 1,500 mg in sodium chloride 0.9 % 500 mL IVPB  Status:  Discontinued     1,500 mg 250 mL/hr over 120 Minutes Intravenous Every 24 hours 10/06/12 1518 10/06/12 1815   10/03/12 2300  vancomycin (VANCOCIN) IVPB 1000 mg/200 mL premix  Status:  Discontinued     1,000 mg 200 mL/hr over 60 Minutes Intravenous Every 24 hours 10/03/12 1030 10/06/12 1517   10/02/12 1500  vancomycin (VANCOCIN) 1,500 mg in sodium chloride 0.9 % 500 mL IVPB  Status:  Discontinued     1,500 mg 250 mL/hr over 120 Minutes Intravenous Every 24 hours 10/01/12 1245 10/03/12 1030   10/01/12 1330  vancomycin (VANCOCIN) 2,000 mg in sodium chloride 0.9 % 500 mL IVPB     2,000 mg 250 mL/hr over 120 Minutes Intravenous  Once 10/01/12 1244 10/01/12 1636   10/01/12 1300  piperacillin-tazobactam (ZOSYN) IVPB 3.375 g     3.375 g 12.5 mL/hr over 240 Minutes Intravenous 3 times per day 10/01/12 1245        Assessment/Plan: p SBO improving. Increase diet to soft, poss regular tonight or in AM Moving bowels. Films may be lagging behind clinic symptom improvement    LOS: 6 days    Solace Wendorff 10/07/2012

## 2012-10-07 NOTE — Progress Notes (Addendum)
ANTICOAGULATION CONSULT NOTE - Follow-up  Pharmacy Consult for warfarin Indication: atrial fibrillation + CVA  No Known Allergies  Patient Measurements: Height: 6\' 2"  (188 cm) Weight: 233 lb 7.5 oz (105.9 kg) (scale b) IBW/kg (Calculated) : 82.2  Vital Signs: Temp: 97.3 F (36.3 C) (06/03 0529) Temp src: Oral (06/03 0529) BP: 136/83 mmHg (06/03 0529) Pulse Rate: 59 (06/03 0529)  Labs:  Recent Labs  10/05/12 0400 10/06/12 0514 10/06/12 1418 10/07/12 0530  LABPROT  --   --  14.1 14.2  INR  --   --  1.10 1.11  CREATININE 1.35 1.25  --  1.29    Estimated Creatinine Clearance: 56.3 ml/min (by C-G formula based on Cr of 1.29).  Assessment: 42 yom admitted with worsening fatigue, generalized weakness and left arm weakness. Started on coumadin 6/2 for afib. INR remains subtherapeutic as expected after only one dose. No new CBC, no bleeding noted. He is also on lovenox for VTE prophylaxis.   Goal of Therapy:  INR 2-3   Plan:  1. Repeat Warfarin 5mg  PO x 1 tonight 2. F/u AM INR  Caroll Weinheimer, Drake Leach 10/07/2012,8:21 AM  Addendum: Changing to apixaban for anticoagulation. Noted plans for possible transfer to inpatient rehab today.   CC: SOB/weakness, recently hospitalization for CAP  PMH: CAD, HTN, HLD, GERD, COPD, CKD, Barrett's, OP, ICM, PVD, OA, BPH, OSA, PM  [id number] for afib, CVA - no new CBC, changed from lovenox/warfarin today since insurance covers  ID: Zosyn D#7 for PNA (recent CAP dc'd on levaquin) - Afebrile, WBC 10.9 as of 5/31, Scr stable at 1.29  5/28 vanc>>6/3 5/28 zosyn>>  5/28 bld x2 - NGTD 5/28 mrsa pcr neg  CV: Hx CAD, HTN, HLD, PM, PVD - BP 136/83, HR 59, EF 40-45% - lipitor, metoprolol - having episodes of afib and PVCs  Endo: AM gluc good - hydrocortisone  GI/Nutr: Dysphagia diet - LFTs ok  Neuro: Hx CVA with suspected small subcorticol stroke this admit - A&O - gabapentin   Renal: Scr seems to have stabilized, K 2.9  (supplemented)  Pulm: 95% RA  Heme/Onc: H/H 14.1/41.5, plts 232 as of 5/31  Best Practices: apixaban  Plan: 1. DC warfarin + lovenox 2. Apixaban 5mg  PO BID   Lysle Pearl, PharmD, BCPS Pager # [phone] 10/07/2012 10:22 AM

## 2012-10-07 NOTE — Progress Notes (Signed)
Occupational Therapy Treatment Patient Details Name: Richard Chavez MRN: [id number] DOB: 06-29-1926 Today's Date: 10/07/2012 Time: 7591-6384 OT Time Calculation (min): 44 min  OT Assessment / Plan / Recommendation Comments on Treatment Session Pt making good progress with OT overall still mod to max assist for selfcare tasks with mod assist for toileting tasks.  Pt also with limited LUE use for all functional tasks and continues to need cueing for positioning and attempted use.  Will benefit from CIR level therapies.    Follow Up Recommendations  CIR       Equipment Recommendations  3 in 1 bedside comode;Tub/shower seat    Recommendations for Other Services Rehab consult  Frequency Min 3X/week   Plan Discharge plan remains appropriate    Precautions / Restrictions Precautions Precautions: Fall Restrictions Weight Bearing Restrictions: No   Pertinent Vitals/Pain Pt with minimal pain in his legs, HR 112 with activity and O2 sats 93% or better during session    ADL  Eating/Feeding: Performed;Set up Where Assessed - Eating/Feeding: Chair Grooming: Performed;Minimal assistance;Wash/dry hands Where Assessed - Grooming: Supported Copywriter, advertising: Performed;Moderate assistance Toilet Transfer Method: Other (comment) (ambulate with hand held assist to the bathroom) Toilet Transfer Equipment: Comfort height toilet;Grab bars Toileting - Clothing Manipulation and Hygiene: Performed;Moderate assistance Where Assessed - Toileting Clothing Manipulation and Hygiene: Sit to stand from 3-in-1 or toilet Transfers/Ambulation Related to ADLs: Pt able to ambulate with mod assist to the bathroom with IV pole on the right side and therapist supporting on the left. ADL Comments: Pt able to transfer from supine to sit EOB with min assist.  Still demonstrates decreased LUE functional use and control.  Needs mod assist to integrate into selfcare or feeding tasks (to hold a cup with straw).  Decreased  propriception noted at pt tends to leave the UE hanging out to the side of his bed or chair at times.  Pt educated on positioning in the bed and bedside chair to help protect the arm.  Performed 1 set of 10 reps for LUE shoulder flexion, elbow flexion and extension, as well as wrist flexion/extension.  Also encouraged pt to attempt these exercises when sitting in the chair as well.       OT Goals ADL Goals ADL Goal: Eating - Progress: Met ADL Goal: Grooming - Progress: Progressing toward goals Pt Will Perform Upper Body Bathing: with min assist;Sitting, edge of bed;with cueing (comment type and amount) Pt Will Perform Upper Body Dressing: with min assist;Sitting, bed;with cueing (comment type and amount) ADL Goal: Toilet Transfer - Progress: Progressing toward goals ADL Goal: Toileting - Clothing Manipulation - Progress: Met ADL Goal: Toileting - Hygiene - Progress: Progressing toward goals Miscellaneous OT Goals OT Goal: Miscellaneous Goal #1 - Progress: Progressing toward goals OT Goal: Miscellaneous Goal #2 - Progress: Progressing toward goals  Visit Information  Last OT Received On: 10/07/12 Assistance Needed: +1    Subjective Data  Subjective: I havent had anything solid to eat until yesterday. Patient Stated Goal: Did not state during this session.      Cognition  Cognition Arousal/Alertness: Awake/alert Behavior During Therapy: WFL for tasks assessed/performed Overall Cognitive Status: Impaired/Different from baseline Area of Impairment: Safety/judgement;Awareness;Memory Problem Solving: Requires verbal cues    Mobility  Bed Mobility Bed Mobility: Supine to Sit Supine to Sit: 4: Min assist;HOB elevated Details for Bed Mobility Assistance: Pt required min instructional cueing and min assist to bring the left arm in front and to not leave it behind him. Transfers Transfers:  Sit to Stand Sit to Stand: 3: Mod assist;With upper extremity assist;From toilet Stand to Sit: 3:  Mod assist;To chair/3-in-1 Details for Transfer Assistance: Pt needs mod instructional cueing for hand placement during sit to stand.       Balance Static Standing Balance Static Standing - Balance Support: Right upper extremity supported Static Standing - Level of Assistance: 4: Min assist Static Standing - Comment/# of Minutes: Pt stood for 1-2 mins while washing hands.   End of Session OT - End of Session Equipment Utilized During Treatment: Gait belt Activity Tolerance: Patient tolerated treatment well Patient left: in chair;with call bell/phone within reach Nurse Communication: Mobility status     Tahjanae Blankenburg OTR/L Pager number [phone] 10/07/2012, 9:12 AM

## 2012-10-07 NOTE — Progress Notes (Signed)
Dr Waymon Amato reports pt to remain on acute today to monitor SBO. Will continue to follow pt's progress and continue to plan for CIR when pt is medically stable. [phone]

## 2012-10-07 NOTE — Progress Notes (Signed)
Pt. Seen and examined. Agree with the NP/PA-C note as written.  Clinically improving - agree with starting Eliquis, which has the safest bleeding profile.  I would continue current dose b-blocker for now.  We will sign-off, but be available as needed during his CIR admission. Follow-up will be arranged in our office.  Chrystie Nose, MD, [facility] Attending Cardiologist [facility]

## 2012-10-07 NOTE — Progress Notes (Signed)
Speech Language Pathology Dysphagia Treatment Patient Details Name: Richard Chavez MRN: [id number] DOB: [birth year] Today's Date: 10/07/2012 Time: 5621-3086 SLP Time Calculation (min): 19 min  Assessment / Plan / Recommendation Clinical Impression  Dysphagia treatment today with physician upgraded diet (Dys 3).  Pt. required moderate-max cues to take smaller bites, remain quiet while food in oral cavity and to check buccal cavities for pocketed food (pocketed on right side vs. his weaker left side).  Delayed cough x 2 (once following liquid and once with mixed consistency in oral cavity) indicating possible decreased ability to protect airway.  Pt. is afebrile, no significant change in lung sounds (no current CXR), therefore recommend he continue Dys 3, thin.  He may need a texture downgrade if he continues to exhibit oral difficulties and/or inability to follow swallow precautions.      Diet Recommendation  Continue with Current Diet: Dysphagia 3 (mechanical soft);Thin liquid    SLP Plan Continue with current plan of care   Pertinent Vitals/Pain none   Swallowing Goals  SLP Swallowing Goals Patient will utilize recommended strategies during swallow to increase swallowing safety with: Minimal cueing Swallow Study Goal #2 - Progress: Progressing toward goal  General Temperature Spikes Noted: No Respiratory Status: Room air Behavior/Cognition: Alert;Cooperative;Pleasant mood;Requires cueing Oral Cavity - Dentition: Edentulous Patient Positioning: Upright in chair  Oral Cavity - Oral Hygiene Does patient have any of the following "at risk" factors?: Other - dysphagia Brush patient's teeth BID with toothbrush (using toothpaste with fluoride): Yes Patient is AT RISK - Oral Care Protocol followed (see row info): Yes   Dysphagia Treatment Treatment focused on: Skilled observation of diet tolerance;Utilization of compensatory strategies Treatment Methods/Modalities: Skilled  observation Patient observed directly with PO's: Yes Type of PO's observed: Dysphagia 3 (soft);Thin liquids Feeding: Able to feed self Liquids provided via: Cup;No straw Oral Phase Signs & Symptoms: Right pocketing;Prolonged mastication;Prolonged oral phase Pharyngeal Phase Signs & Symptoms: Delayed cough;Suspected delayed swallow initiation Type of cueing: Verbal;Visual Amount of cueing: Moderate   GO    Breck Coons Ruffin.Ed CCC-SLP Pager [phone]  10/07/2012  Royce Macadamia 10/07/2012, 1:50 PM

## 2012-10-07 NOTE — Progress Notes (Signed)
Physical Therapy Treatment Patient Details Name: Richard Chavez MRN: [id number] DOB: November 19, 1926 Today's Date: 10/07/2012 Time: 1340-1407 PT Time Calculation (min): 27 min  PT Assessment / Plan / Recommendation Comments on Treatment Session  Pt is an 79 y o male s/p lacunar stroke affecting L UE primarily. Pt with improved mobility, requiring decreased transfer assistance and demo'ing increased ambulation distance with minA. Pt is able to move L UE utilizing proximal musculature but does not have control of L hand making ambulation with RW difficult. Although pt able to compensate for balance deficits during ambulation with minA, pt required modA to remain upright with balance activities such as semi-tandem stance. Pt will greatly benefit from CIR to improve activity tolerance, control of L UE and balance for ADLs and safe ambulation.     Follow Up Recommendations  CIR     Does the patient have the potential to tolerate intense rehabilitation     Barriers to Discharge        Equipment Recommendations  Cane    Recommendations for Other Services    Frequency Min 4X/week   Plan Discharge plan remains appropriate;Frequency remains appropriate    Precautions / Restrictions Precautions Precautions: Fall Precaution Comments: Pt with some proximal but limited distal control of L LE Restrictions Weight Bearing Restrictions: No   Pertinent Vitals/Pain Pt denies pain    Mobility  Transfers Transfers: Sit to Stand;Stand to Sit Sit to Stand: 3: Mod assist;With upper extremity assist;From chair/3-in-1 (R UE assist, x2) Stand to Sit: 3: Mod assist;To chair/3-in-1 (x2, pt unaware of L hand location) Details for Transfer Assistance: Pt needs mod instructional cueing for hand placement during sit to stand and stand to sit Ambulation/Gait Ambulation/Gait Assistance: 4: Min assist (with hadnheld A on RW for L UE) Ambulation Distance (Feet): 90 Feet (with 2 standind rest breaks) Assistive device:  Rolling walker Ambulation/Gait Assistance Details: Assistance hold L hand on RW and preventing LOB Gait Pattern: Step-through pattern;Decreased stride length Gait velocity: decreased General Gait Details: Occasional cues for negotiation of obstacles, pt reports his legs are tired from OT this morning    Exercises General Exercises - Lower Extremity Ankle Circles/Pumps: Strengthening;Both;10 reps;Seated Long Arc Quad: Strengthening;Both;10 reps;Seated   PT Diagnosis:    PT Problem List:   PT Treatment Interventions:     PT Goals Acute Rehab PT Goals PT Goal Formulation: With patient Time For Goal Achievement: 10/14/12 Potential to Achieve Goals: Good PT Goal: Sit to Stand - Progress: Progressing toward goal PT Goal: Stand to Sit - Progress: Progressing toward goal PT Goal: Stand - Progress: Progressing toward goal PT Goal: Ambulate - Progress: Progressing toward goal  Visit Information  Last PT Received On: 10/07/12 Assistance Needed: +1    Subjective Data  Subjective: Pt recieved in chair, agreeable to PT   Cognition  Cognition Arousal/Alertness: Awake/alert Behavior During Therapy: WFL for tasks assessed/performed Overall Cognitive Status: Impaired/Different from baseline Area of Impairment: Safety/judgement;Memory Orientation Level: Disoriented to;Place (slow response to place) Memory: Decreased short-term memory (Pt has been working with nurse on saying he is at [employer]) Safety/Judgement: Decreased awareness of deficits (cues for L UE) General Comments: Pt keeps trying to use L UE but continually forgets it and it falls hitting chair arms and the walker    Balance  Balance Balance Assessed: Yes Static Sitting Balance Static Sitting - Balance Support: Feet supported Static Sitting - Level of Assistance: 5: Stand by assistance Static Sitting - Comment/# of Minutes: 3 Static Standing Balance Static Standing -  Balance Support: Right upper extremity supported Static  Standing - Level of Assistance: 3: Mod assist Static Standing - Comment/# of Minutes: 2 (no UE support, mod assist to maintain) Tandem Stance - Right Leg:  (30 seconds in semi-tandem) Tandem Stance - Left Leg:  (15 seconds in semi tandem L leg forward)  End of Session PT - End of Session Equipment Utilized During Treatment: Gait belt Activity Tolerance: Patient tolerated treatment well Patient left: in chair;with call bell/phone within reach Nurse Communication:  (orientation)   GP     10/07/2012, 2:31 PM Marvis Moeller, Student Physical Therapist Office #: [phone]   Agree with above assessment.  Lewis Shock, PT, DPT Pager #: [phone] Office #: [phone]

## 2012-10-07 NOTE — Progress Notes (Signed)
Clinically improving.  

## 2012-10-07 NOTE — Progress Notes (Addendum)
[facility] PROGRESS NOTE  Richard Chavez XBJ:[id number] DOB: [birth year] DOA: 10/01/2012 PCP: Thayer Headings, MD  Brief narrative 79 year old male with extensive past medical history including CAD, CABG, ischemic cardiomyopathy, EF 35-40% 3/13, PPM, chronic combined systolic/diastolic CHF HTN, HL, GERD, COPD, chronic kidney disease, recent hospitalization 5/12-5/14 for community-acquired pneumonia presented on 5/28 with complaints of numbness in left hand, chills, weakness, mild productive sputum, fevers, mild SOB and leg edema. In the ED, chest x-ray suggestive of pulmonary edema, ABG showed pH of of 72%, creatinine 1.42, proBNP 1059. Hospitalist admission requested. Subsequent CT imaging confirmed right brain infarct. Neurology consulted and signed off. She developed small bowel obstruction which is resolving by conservative measures. He has been started on anticoagulation for A. fib-thought to be the cause of his embolic stroke. When medically stable, will DC to CIR.    Assessment/Plan: 1. Subacute right brain infarct: Initial CT imaging was negative but subsequent one showed subacute stroke. Unable to do MRI secondary to pacer. His CVA could be embolic from A. fib-neurology initially recommended holding off anticoagulation for 7-14 days post stroke but stroke M.D.on 6/2 recommended starting anticoagulation right away. Completed stroke workup. Patient switching to Eliquis. 2. Small bowel obstruction: Unclear etiology. Surgery following. Clinically improved and tolerated full liquid diet. Today advance to soft diet. X-rays seemed to be lagging behind his improvement. Monitor  3. Acute on on chronic kidney disease stage III: Likely precipitated by diuresis. Held Lasix and treated briefly with IV fluids. Resolved.  4. Hyperkalemia: Secondary to acute renal failure and potassium supplements. Hold potassium supplements. IV fluids and resolved. 5. A. fib with RVR and NSVT: Management per cardiology.  Beta blockers will be changed back to by mouth. Anticoagulation as indicated in problem #1. 6. Shortness of breath-multifactorial-potentially aspiration pneumonia +/-CHF exacerbation-patient's dry weight is 233 pounds. Continue daily weights, strict in and out. Clinically dry and patient denies dyspnea. Brief IV fluids and hold Lasix. Compensated. 7. Potential aspiration pneumonia : Agree with empiric Vancomycin/Zosyn for now. CT abdomen showed right middle lobe and lower lobe opacities. Will need followup imaging to ensure resolution. Will get speech therapist input to rule out overt oropharyngeal dysphagia. Oxygen and nebs to continue as needed. Completed 7 days of IV antibiotics-discontinue  4. History of CAD/cardiomyopathy/CHF NYHA class II- cardiology input appreciated. 5. COPD history-continue Combivent inhaler 2 puff twice a day. Continue home dose prednisone 5 mg tablet-changed to IV hydrocortisone secondary to n.p.o.. Continue tiotropium 18 mcg daily every morning. COPD appears to be at baseline. No wheezing. We'll change back to oral prednisone when tolerating diet consistently. 6. Hypotension-seems like a chronic issue.  resolved.  7. Hyperlipidemia continue Lipitor 20 mg daily with breakfast. Lipid panel stable. 8. Acute Confusion: Possibly from acute illness-improving. Does have history of sundowning- likely underlying dementia. 9. Hypokalemia: Replete and follow BMP   Code Status: Full Family Communication: Discussed with patient Disposition Plan: Remains inpatient. Discharge to CIR in the next 24-48 hours   Consultants:  Cardiology  Neurology  Rehab MD  General surgery  Procedures:  None  Antibiotics:  Vancomycin DC'd  Zosyn DC'd  HPI/Subjective: Denies complaints. Claims to have had a BM this morning. No dyspnea or chest pain.  Objective: Filed Vitals:   10/07/12 0900 10/07/12 0914 10/07/12 1323 10/07/12 1415  BP:  129/80 126/62   Pulse: 112 99 72   Temp:       TempSrc:      Resp:   16   Height:  Weight:      SpO2: 93%  99% 99%    Intake/Output Summary (Last 24 hours) at 10/07/12 1807 Last data filed at 10/07/12 0755  Gross per 24 hour  Intake    480 ml  Output    251 ml  Net    229 ml   Filed Weights   10/05/12 0421 10/06/12 0524 10/07/12 0529  Weight: 103.2 kg (227 lb 8.2 oz) 103.9 kg (229 lb 0.9 oz) 105.9 kg (233 lb 7.5 oz)    Exam:   General exam: Comfortable. Sitting up on chair.   Respiratory system:  reduced breath sounds in the bases but otherwise clear to auscultation . No increased work of breathing. Midline sternotomy scar.  Cardiovascular system: S1 & S2 heard,  irregular . No JVD, murmurs, gallops, clicks or pedal edema. Telemetry: A. fib with CVR  Gastrointestinal system: Abdomen is less distended, soft and nontender.  Normal bowel sounds heard.  Central nervous system: Alert and oriented x3 . No focal neurological deficits.  Extremities: Symmetric 5 x 5 power.   Data Reviewed: Basic Metabolic Panel:  Recent Labs Lab 10/03/12 0625 10/04/12 0530 10/05/12 0400 10/06/12 0514 10/07/12 0530  NA 133* 139 137 138 138  K 5.4* 3.8 4.0 3.0* 2.9*  CL 93* 100 101 101 103  CO2 26 24 24 26 26   GLUCOSE 99 128* 107* 96 103*  BUN 27* 30* 35* 34* 27*  CREATININE 1.99* 1.52* 1.35 1.25 1.29  CALCIUM 9.2 9.1 8.8 8.9 8.7  MG 2.5  --   --   --   --    Liver Function Tests:  Recent Labs Lab 10/01/12 0846 10/02/12 0820 10/03/12 0625  AST 14 22 54*  ALT 12 11 13   ALKPHOS 59 77 61  BILITOT 0.9 2.0* 1.6*  PROT 6.6 6.3 7.2  ALBUMIN 3.1* 2.8* 2.8*   No results found for this basename: LIPASE, AMYLASE,  in the last 168 hours No results found for this basename: AMMONIA,  in the last 168 hours CBC:  Recent Labs Lab 10/01/12 0846 10/02/12 0510 10/03/12 0625 10/04/12 0530  WBC 8.8 16.2* 14.5* 10.9*  NEUTROABS 7.3  --   --   --   HGB 14.4 13.3 14.5 14.1  HCT 43.6 38.4* 44.7 41.5  MCV 87.2 86.7 87.3 85.4   PLT 210 PLATELET CLUMPS NOTED ON SMEAR, COUNT APPEARS ADEQUATE 206 232   Cardiac Enzymes:  Recent Labs Lab 10/01/12 0849  TROPONINI <0.30   BNP (last 3 results)  Recent Labs  09/15/12 1130 10/01/12 0849 10/03/12 0625  PROBNP 1760.0* 1059.0* 993.5*   CBG: No results found for this basename: GLUCAP,  in the last 168 hours  Recent Results (from the past 240 hour(s))  CULTURE, BLOOD (ROUTINE X 2)     Status: None   Collection Time    10/01/12  1:49 PM      Result Value Range Status   Specimen Description BLOOD RIGHT FOREARM   Final   Special Requests BOTTLES DRAWN AEROBIC ONLY [facility]   Final   Culture  Setup Time 10/01/2012 17:12   Final   Culture NO GROWTH 5 DAYS   Final   Report Status 10/07/2012 FINAL   Final  CULTURE, BLOOD (ROUTINE X 2)     Status: None   Collection Time    10/01/12  1:55 PM      Result Value Range Status   Specimen Description BLOOD THUMB RIGHT   Final   Special Requests BOTTLES  DRAWN AEROBIC ONLY [facility]   Final   Culture  Setup Time 10/01/2012 17:12   Final   Culture NO GROWTH 5 DAYS   Final   Report Status 10/07/2012 FINAL   Final  MRSA PCR SCREENING     Status: None   Collection Time    10/01/12  4:29 PM      Result Value Range Status   MRSA by PCR NEGATIVE  NEGATIVE Final   Comment:            The GeneXpert MRSA Assay (FDA     approved for NASAL specimens     only), is one component of a     comprehensive MRSA colonization     surveillance program. It is not     intended to diagnose MRSA     infection nor to guide or     monitor treatment for     MRSA infections.     Studies: Dg Abd 1 View  10/06/2012   *RADIOLOGY REPORT*  Clinical Data: Follow-up small bowel obstruction.  ABDOMEN - 1 VIEW  Comparison: 10/05/2012  Findings: There are loops of dilated small bowel without colonic distention, findings without substantial change from the prior exam allowing for differences in radiographic technique.  The findings remain consistent with a  partial small bowel obstruction.  No gross free air on the supine study.  There is atherosclerotic calcification along the abdominal aorta and iliac arteries and evidence of a small fusiform infrarenal abdominal aortic aneurysm measuring approximate 3.6 cm in diameter.  IMPRESSION: Persistent partial small bowel obstruction.   Original Report Authenticated By: Amie Portland, M.D.   CT of the brain  10/01/2012 1. No acute intracranial abnormality 2. Stable mild atrophy and chronic white matter ischemic changes 3. Intracranial atherosclerosis   10/02/2012 Cytotoxic edema and hypodensity in the vicinity of the inferior frontal gyrus and precentral gyrus on the right, compatible with subacute stroke. No hemorrhagic transformation observed   2D Echocardiogram 40-45%. No PFO or defect identified.   Carotid Doppler Preliminary findings: Right = 40-59% ICA stenosis. Left = Borderline 60-79% ICA stenosis.   TCD This was a normal transcranial Doppler study, with normal flow direction and velocity of all identified vessels of the anterior and posterior circulations, with no evidence of stenosis, vasospasm or occlusion. Low mean flow velocities in posterior circulation vessels are due to poor waveforms from suboptimal windows.   CXR 10/01/2012 Worsened aeration, especially on the right. Suspicious for alveolar pulmonary edema versus multifocal infection. Degraded exam, especially the lateral view.   Additional labs:   Scheduled Meds: . apixaban  5 mg Oral BID  . atorvastatin  20 mg Oral Q breakfast  . docusate sodium  100 mg Oral Daily  . gabapentin  600 mg Oral TID  . guaiFENesin  1,200 mg Oral BID  . hydrocortisone sod succinate (SOLU-CORTEF) inj  25 mg Intravenous Q12H  . Ipratropium-Albuterol  2 puff Inhalation BID  . loratadine  10 mg Oral Q breakfast  . magnesium chloride  1 tablet Oral BID  . metoprolol tartrate  50 mg Oral BID  . piperacillin-tazobactam (ZOSYN)  IV  3.375 g Intravenous Q8H  .  sodium chloride  3 mL Intravenous Q12H  . Vitamin D (Ergocalciferol)  50,000 Units Oral Q7 days   Continuous Infusions:    Principal Problem:   Stroke, lacunar Active Problems:   Hypertension   NSVT    COPD (chronic obstructive pulmonary disease)   CAD CABG 1998, patent grafts  06/06/11 after abn Myoview   Pacemaker, St Jude, implanted 2008   Cardiomyopathy, ischemic, EF 30-35% by recent cath   Chronic combined systolic and diastolic congestive heart failure   CKD (chronic kidney disease) stage 2, GFR 60-89 ml/min   OSA (obstructive sleep apnea)   Chronic kidney disease, stage III (moderate)   Acute on chronic combined systolic and diastolic congestive heart failure, NYHA class 3   Possible reccuring Aspiration pneumonia, worsened by potential CVA   Atrial fibrillation with RVR   Small bowel obstruction   Renal failure, acute on chronic   Hyperkalemia    Time spent:  30 minutes     [facility]  [facility] Pager [phone].   If 8PM-8AM, please contact night-coverage at [email or web address], password [facility] 10/07/2012, 6:07 PM  LOS: 6 days

## 2012-10-07 NOTE — Progress Notes (Signed)
[facility]  Subjective: Pt denies abdominal pain, n/v. No chest pain or SOB.   Objective: Vital signs in last 24 hours: Temp:  [97.3 F (36.3 C)-97.7 F (36.5 C)] 97.3 F (36.3 C) (06/03 0529) Pulse Rate:  [59-112] 99 (06/03 0914) Resp:  [18-20] 18 (06/03 0529) BP: (121-148)/(60-83) 129/80 mmHg (06/03 0914) SpO2:  [93 %-100 %] 93 % (06/03 0900) FiO2 (%):  [21 %] 21 % (06/02 0952) Weight:  [233 lb 7.5 oz (105.9 kg)] 233 lb 7.5 oz (105.9 kg) (06/03 0529) Last BM Date: 10/06/12  Intake/Output from previous day: 06/02 0701 - 06/03 0700 In: 1065 [P.O.:840; I.V.:175; IV Piggyback:50] Out: 550 [Urine:550] Intake/Output this shift: Total I/O In: 480 [P.O.:480] Out: 1 [Stool:1]  Medications Current Facility-Administered Medications  Medication Dose Route Frequency Provider Last Rate Last Dose  . atorvastatin (LIPITOR) tablet 20 mg  20 mg Oral Q breakfast Rhetta Mura, MD   20 mg at 10/07/12 0914  . docusate sodium (COLACE) capsule 100 mg  100 mg Oral Daily Marykay Lex, MD   100 mg at 10/07/12 0911  . enoxaparin (LOVENOX) injection 40 mg  40 mg Subcutaneous Q24H Layne Benton, NP   40 mg at 10/07/12 0913  . gabapentin (NEURONTIN) capsule 600 mg  600 mg Oral TID Rhetta Mura, MD   600 mg at 10/07/12 0915  . guaiFENesin (MUCINEX) 12 hr tablet 1,200 mg  1,200 mg Oral BID Rhetta Mura, MD   1,200 mg at 10/07/12 0911  . hydrocortisone sodium succinate (SOLU-CORTEF) 100 mg/2 mL injection 25 mg  25 mg Intravenous Q12H Elease Etienne, MD   25 mg at 10/07/12 0413  . Ipratropium-Albuterol (COMBIVENT) respimat 2 puff  2 puff Inhalation BID Rhetta Mura, MD   2 puff at 10/06/12 1952  . loratadine (CLARITIN) tablet 10 mg  10 mg Oral Q breakfast Rhetta Mura, MD   10 mg at 10/07/12 0914  . magnesium chloride (SLOW-MAG) 64 MG SR tablet 64 mg  1 tablet Oral BID Rhetta Mura, MD   64 mg at 10/07/12 0910  . magnesium  hydroxide (MILK OF MAGNESIA) suspension 5 mL  5 mL Oral Daily PRN Gerome Apley Harduk, PA-C   5 mL at 10/03/12 0146  . metoprolol (LOPRESSOR) tablet 50 mg  50 mg Oral BID Maite Burlison, PA-C   50 mg at 10/07/12 0914  . ondansetron (ZOFRAN) injection 4 mg  4 mg Intravenous Q8H PRN Gerome Apley [place], PA-C   4 mg at 10/02/12 2008  . piperacillin-tazobactam (ZOSYN) IVPB 3.375 g  3.375 g Intravenous Q8H Ann Held, RPH   3.375 g at 10/07/12 0543  . potassium chloride SA (K-DUR,KLOR-CON) CR tablet 40 mEq  40 mEq Oral Q4H Elease Etienne, MD   40 mEq at 10/07/12 0909  . sodium chloride 0.9 % injection 3 mL  3 mL Intravenous Q12H Elease Etienne, MD   3 mL at 10/06/12 2208  . Vitamin D (Ergocalciferol) (DRISDOL) capsule 50,000 Units  50,000 Units Oral Q7 days Rhetta Mura, MD      . Warfarin - Pharmacist Dosing Inpatient   Does not apply q1800 Drake Leach [place], [facility]        PE: General appearance: alert, cooperative and no distress Lungs: bilateral diffuse rhonchi, no rales Heart: regular rate and rhythm Extremities: no LEE Pulses: 2+ and symmetric Skin: warm and dry Neurologic: Grossly normal  Lab Results:  No results found for this basename: WBC, HGB, HCT, PLT,  in the last 72 hours BMET  Recent Labs  10/05/12 0400 10/06/12 0514 10/07/12 0530  NA 137 138 138  K 4.0 3.0* 2.9*  CL 101 101 103  CO2 24 26 26   GLUCOSE 107* 96 103*  BUN 35* 34* 27*  CREATININE 1.35 1.25 1.29  CALCIUM 8.8 8.9 8.7   PT/INR  Recent Labs  10/06/12 1418 10/07/12 0530  LABPROT 14.1 14.2  INR 1.10 1.11    Studies/Results:  Abdominal Plain Film 10/06/12 RADIOLOGY REPORT*  Clinical Data: Follow-up small bowel obstruction.  ABDOMEN - 1 VIEW  Comparison: 10/05/2012  Findings: There are loops of dilated small bowel without colonic  distention, findings without substantial change from the prior exam  allowing for differences in radiographic technique. The findings  remain consistent with  a partial small bowel obstruction. No gross  free air on the supine study. There is atherosclerotic  calcification along the abdominal aorta and iliac arteries and  evidence of a small fusiform infrarenal abdominal aortic aneurysm  measuring approximate 3.6 cm in diameter.  IMPRESSION:  Persistent partial small bowel obstruction.  Original Report Authenticated By: Amie Portland, M.D.   Assessment/Plan  Principal Problem:   Stroke, lacunar Active Problems:   Hypertension   NSVT    COPD (chronic obstructive pulmonary disease)   CAD CABG 1998, patent grafts 06/06/11 after abn Myoview   Pacemaker, St Jude, implanted 2008   Cardiomyopathy, ischemic, EF 30-35% by recent cath   Chronic combined systolic and diastolic congestive heart failure   CKD (chronic kidney disease) stage 2, GFR 60-89 ml/min   OSA (obstructive sleep apnea)   Chronic kidney disease, stage III (moderate)   Acute on chronic combined systolic and diastolic congestive heart failure, NYHA class 3   Possible reccuring Aspiration pneumonia, worsened by potential CVA   Atrial fibrillation with RVR   Small bowel obstruction   Renal failure, acute on chronic   Hyperkalemia  Plan: Abdominal plain flim yesterday demonstrated a persistent partial SBO. However, pt continues to tolerate PO intake well. He was transitioned to PO lopressor yesterday. No further a-fib on telemetry, but he continues to have frequent PVCs. HR is in the 80s-90s. BP is stable. Current dose is 50 mg BID. Can consider titrating up as a OP. Neurology has approved the use of Eliquis for [facility]. CM got approval from insurance. [name] co-pay would be $25 for a 30 day supply. I spoke with pharmacy. Their recommendation is for 5 mg BID. Will start today. Pt has already received supplemental K+ for his hypokalemia (2.9). ? Transfer to IP rehab today.     LOS: 6 days    Tiffany Calmes M. Delmer Islam 10/07/2012 9:34 AM

## 2012-10-07 NOTE — Progress Notes (Signed)
Physical Therapy Treatment Patient Details Name: Richard Chavez MRN: [id number] DOB: 10-18-1926 Today's Date: 10/07/2012 Time: 1340-1407 PT Time Calculation (min): 27 min  PT Assessment / Plan / Recommendation Comments on Treatment Session  Pt is an 79 y o male s/p lacunar stroke affecting L UE primarily. Pt with improved mobility, requiring decreased transfer assistance and demo'ing increased ambulation distance with minA. Pt is able to move L UE utilizing proximal musculature but does not have control of L hand making ambulation with RW difficult. Although pt able to compensate for balance deficits during ambulation with minA, pt required modA to remain upright with balance activities such as semi-tandem stance. Pt will greatly benefit from CIR to improve activity tolerance, control of L UE and balance for ADLs and safe ambulation.     Follow Up Recommendations  CIR     Does the patient have the potential to tolerate intense rehabilitation     Barriers to Discharge        Equipment Recommendations  Cane    Recommendations for Other Services    Frequency Min 4X/week   Plan Discharge plan remains appropriate;Frequency remains appropriate    Precautions / Restrictions Precautions Precautions: Fall Precaution Comments: Pt with some proximal but limited distal control of L LE Restrictions Weight Bearing Restrictions: No   Pertinent Vitals/Pain Pt denies pain    Mobility  Transfers Transfers: Sit to Stand;Stand to Sit Sit to Stand: 3: Mod assist;With upper extremity assist;From chair/3-in-1 (R UE assist, x2) Stand to Sit: 3: Mod assist;To chair/3-in-1 (x2, pt unaware of L hand location) Details for Transfer Assistance: Pt needs mod instructional cueing for hand placement during sit to stand and stand to sit Ambulation/Gait Ambulation/Gait Assistance: 4: Min assist (with hadnheld A on RW for L UE) Ambulation Distance (Feet): 90 Feet (with 2 standind rest breaks) Assistive device:  Rolling walker Ambulation/Gait Assistance Details: Assistance hold L hand on RW and preventing LOB Gait Pattern: Step-through pattern;Decreased stride length Gait velocity: decreased General Gait Details: Occasional cues for negotiation of obstacles, pt reports his legs are tired from OT this morning    Exercises General Exercises - Lower Extremity Ankle Circles/Pumps: Strengthening;Both;10 reps;Seated Long Arc Quad: Strengthening;Both;10 reps;Seated   PT Diagnosis:    PT Problem List:   PT Treatment Interventions:     PT Goals Acute Rehab PT Goals PT Goal Formulation: With patient Time For Goal Achievement: 10/14/12 Potential to Achieve Goals: Good PT Goal: Sit to Stand - Progress: Progressing toward goal PT Goal: Stand to Sit - Progress: Progressing toward goal PT Goal: Stand - Progress: Progressing toward goal PT Goal: Ambulate - Progress: Progressing toward goal  Visit Information  Last PT Received On: 10/07/12 Assistance Needed: +1    Subjective Data  Subjective: Pt recieved in chair, agreeable to PT   Cognition  Cognition Arousal/Alertness: Awake/alert Behavior During Therapy: WFL for tasks assessed/performed Overall Cognitive Status: Impaired/Different from baseline Area of Impairment: Safety/judgement;Memory Orientation Level: Disoriented to;Place (slow response to place) Memory: Decreased short-term memory (Pt has been working with nurse on saying he is at [employer]) Safety/Judgement: Decreased awareness of deficits (cues for L UE) General Comments: Pt keeps trying to use L UE but continually forgets it and it falls hitting chair arms and the walker    Balance  Balance Balance Assessed: Yes Static Sitting Balance Static Sitting - Balance Support: Feet supported Static Sitting - Level of Assistance: 5: Stand by assistance Static Sitting - Comment/# of Minutes: 3 Static Standing Balance Static Standing -  Balance Support: Right upper extremity supported Static  Standing - Level of Assistance: 3: Mod assist Static Standing - Comment/# of Minutes: 2 (no UE support, mod assist to maintain) Tandem Stance - Right Leg:  (30 seconds in semi-tandem) Tandem Stance - Left Leg:  (15 seconds in semi tandem L leg forward)  End of Session PT - End of Session Equipment Utilized During Treatment: Gait belt Activity Tolerance: Patient tolerated treatment well Patient left: in chair;with call bell/phone within reach Nurse Communication:  (orientation)   GP     10/07/2012, 2:31 PM Marvis Moeller, Student Physical Therapist Office #: [phone]

## 2012-10-08 ENCOUNTER — Inpatient Hospital Stay (HOSPITAL_COMMUNITY)
Admission: RE | Admit: 2012-10-08 | Discharge: 2012-10-17 | DRG: 945 | Disposition: A | Discharge status: Home Health | Payer: Medicare Other | Source: Intra-hospital | Attending: Physical Medicine & Rehabilitation | Admitting: Physical Medicine & Rehabilitation

## 2012-10-08 DIAGNOSIS — I4891 Unspecified atrial fibrillation: Secondary | ICD-10-CM | POA: Diagnosis present

## 2012-10-08 DIAGNOSIS — Z8249 Family history of ischemic heart disease and other diseases of the circulatory system: Secondary | ICD-10-CM

## 2012-10-08 DIAGNOSIS — R5383 Other fatigue: Secondary | ICD-10-CM | POA: Diagnosis present

## 2012-10-08 DIAGNOSIS — M810 Age-related osteoporosis without current pathological fracture: Secondary | ICD-10-CM | POA: Diagnosis present

## 2012-10-08 DIAGNOSIS — Z950 Presence of cardiac pacemaker: Secondary | ICD-10-CM

## 2012-10-08 DIAGNOSIS — I480 Paroxysmal atrial fibrillation: Secondary | ICD-10-CM | POA: Diagnosis present

## 2012-10-08 DIAGNOSIS — I2589 Other forms of chronic ischemic heart disease: Secondary | ICD-10-CM | POA: Diagnosis present

## 2012-10-08 DIAGNOSIS — Z5189 Encounter for other specified aftercare: Principal | ICD-10-CM

## 2012-10-08 DIAGNOSIS — G8190 Hemiplegia, unspecified affecting unspecified side: Secondary | ICD-10-CM | POA: Diagnosis present

## 2012-10-08 DIAGNOSIS — Z87891 Personal history of nicotine dependence: Secondary | ICD-10-CM

## 2012-10-08 DIAGNOSIS — K219 Gastro-esophageal reflux disease without esophagitis: Secondary | ICD-10-CM | POA: Diagnosis present

## 2012-10-08 DIAGNOSIS — N400 Enlarged prostate without lower urinary tract symptoms: Secondary | ICD-10-CM | POA: Diagnosis present

## 2012-10-08 DIAGNOSIS — I129 Hypertensive chronic kidney disease with stage 1 through stage 4 chronic kidney disease, or unspecified chronic kidney disease: Secondary | ICD-10-CM | POA: Diagnosis present

## 2012-10-08 DIAGNOSIS — Z806 Family history of leukemia: Secondary | ICD-10-CM

## 2012-10-08 DIAGNOSIS — Z801 Family history of malignant neoplasm of trachea, bronchus and lung: Secondary | ICD-10-CM

## 2012-10-08 DIAGNOSIS — I639 Cerebral infarction, unspecified: Secondary | ICD-10-CM | POA: Diagnosis present

## 2012-10-08 DIAGNOSIS — J4489 Other specified chronic obstructive pulmonary disease: Secondary | ICD-10-CM | POA: Diagnosis present

## 2012-10-08 DIAGNOSIS — J449 Chronic obstructive pulmonary disease, unspecified: Secondary | ICD-10-CM | POA: Diagnosis present

## 2012-10-08 DIAGNOSIS — Z8261 Family history of arthritis: Secondary | ICD-10-CM

## 2012-10-08 DIAGNOSIS — I252 Old myocardial infarction: Secondary | ICD-10-CM

## 2012-10-08 DIAGNOSIS — E785 Hyperlipidemia, unspecified: Secondary | ICD-10-CM | POA: Diagnosis present

## 2012-10-08 DIAGNOSIS — I509 Heart failure, unspecified: Secondary | ICD-10-CM | POA: Diagnosis present

## 2012-10-08 DIAGNOSIS — I25810 Atherosclerosis of coronary artery bypass graft(s) without angina pectoris: Secondary | ICD-10-CM | POA: Diagnosis present

## 2012-10-08 DIAGNOSIS — R5381 Other malaise: Secondary | ICD-10-CM | POA: Diagnosis present

## 2012-10-08 DIAGNOSIS — I10 Essential (primary) hypertension: Secondary | ICD-10-CM | POA: Diagnosis present

## 2012-10-08 DIAGNOSIS — I6350 Cerebral infarction due to unspecified occlusion or stenosis of unspecified cerebral artery: Secondary | ICD-10-CM | POA: Diagnosis present

## 2012-10-08 DIAGNOSIS — E876 Hypokalemia: Secondary | ICD-10-CM | POA: Diagnosis present

## 2012-10-08 DIAGNOSIS — N189 Chronic kidney disease, unspecified: Secondary | ICD-10-CM | POA: Diagnosis present

## 2012-10-08 DIAGNOSIS — J690 Pneumonitis due to inhalation of food and vomit: Secondary | ICD-10-CM | POA: Diagnosis present

## 2012-10-08 DIAGNOSIS — I5042 Chronic combined systolic (congestive) and diastolic (congestive) heart failure: Secondary | ICD-10-CM | POA: Diagnosis present

## 2012-10-08 DIAGNOSIS — I6340 Cerebral infarction due to embolism of unspecified cerebral artery: Secondary | ICD-10-CM

## 2012-10-08 LAB — BASIC METABOLIC PANEL
BUN: 22 mg/dL (ref 6–23)
Calcium: 8.6 mg/dL (ref 8.4–10.5)
GFR calc Af Amer: 60 mL/min — ABNORMAL LOW (ref >90)
GFR calc non Af Amer: 52 mL/min — ABNORMAL LOW (ref >90)
Glucose, Bld: 122 mg/dL — ABNORMAL HIGH (ref 70–99)

## 2012-10-08 LAB — PROTIME-INR: Prothrombin Time: 17.6 seconds — ABNORMAL HIGH (ref 11.6–15.2)

## 2012-10-08 MED ORDER — BISACODYL 10 MG RE SUPP: 10.0000 mg | Freq: Every day | RECTAL | Status: DC | PRN

## 2012-10-08 MED ORDER — APIXABAN 5 MG PO TABS
5.0000 mg | ORAL_TABLET | Freq: Two times a day (BID) | ORAL | Status: DC
  Administered 2012-10-08 – 2012-10-17 (×18): 5 mg via ORAL
  Filled 2012-10-08 (×21): qty 1

## 2012-10-08 MED ORDER — VITAMIN D (ERGOCALCIFEROL) 1.25 MG (50000 UNIT) PO CAPS
50000.0000 [IU] | ORAL_CAPSULE | ORAL | Status: DC
  Administered 2012-10-10 – 2012-10-17 (×2): 50000 [IU] via ORAL
  Filled 2012-10-08 (×3): qty 1

## 2012-10-08 MED ORDER — TIOTROPIUM BROMIDE MONOHYDRATE 18 MCG IN CAPS
18.0000 ug | ORAL_CAPSULE | Freq: Every day | RESPIRATORY_TRACT | Status: DC
  Administered 2012-10-09 – 2012-10-17 (×9): 18 ug via RESPIRATORY_TRACT
  Filled 2012-10-08 (×2): qty 5

## 2012-10-08 MED ORDER — GUAIFENESIN ER 600 MG PO TB12
1200.0000 mg | ORAL_TABLET | Freq: Two times a day (BID) | ORAL | Status: DC
  Administered 2012-10-08 – 2012-10-17 (×18): 1200 mg via ORAL
  Filled 2012-10-08 (×20): qty 2

## 2012-10-08 MED ORDER — GABAPENTIN 300 MG PO CAPS
600.0000 mg | ORAL_CAPSULE | Freq: Three times a day (TID) | ORAL | Status: DC
  Administered 2012-10-08 – 2012-10-17 (×25): 600 mg via ORAL
  Filled 2012-10-08 (×30): qty 2

## 2012-10-08 MED ORDER — METOPROLOL TARTRATE 50 MG PO TABS
50.0000 mg | ORAL_TABLET | Freq: Two times a day (BID) | ORAL | Status: DC
  Administered 2012-10-08 – 2012-10-17 (×18): 50 mg via ORAL
  Filled 2012-10-08 (×20): qty 1

## 2012-10-08 MED ORDER — DOCUSATE SODIUM 100 MG PO CAPS
100.0000 mg | ORAL_CAPSULE | Freq: Every day | ORAL | Status: DC
  Administered 2012-10-09 – 2012-10-17 (×9): 100 mg via ORAL
  Filled 2012-10-08 (×10): qty 1

## 2012-10-08 MED ORDER — ALUM & MAG HYDROXIDE-SIMETH 200-200-20 MG/5ML PO SUSP
30.0000 mL | ORAL | Status: DC | PRN
  Administered 2012-10-15: 30 mL via ORAL
  Filled 2012-10-08: qty 30

## 2012-10-08 MED ORDER — HYDROCORTISONE SOD SUCCINATE 100 MG IJ SOLR
25.0000 mg | Freq: Every day | INTRAMUSCULAR | Status: DC
  Filled 2012-10-08: qty 0.5

## 2012-10-08 MED ORDER — PROCHLORPERAZINE 25 MG RE SUPP
12.5000 mg | Freq: Four times a day (QID) | RECTAL | Status: DC | PRN
  Filled 2012-10-08: qty 1

## 2012-10-08 MED ORDER — POLYETHYLENE GLYCOL 3350 17 G PO PACK
17.0000 g | PACK | Freq: Every day | ORAL | Status: DC | PRN
  Filled 2012-10-08: qty 1

## 2012-10-08 MED ORDER — PROCHLORPERAZINE EDISYLATE 5 MG/ML IJ SOLN
5.0000 mg | Freq: Four times a day (QID) | INTRAMUSCULAR | Status: DC | PRN
  Filled 2012-10-08: qty 2

## 2012-10-08 MED ORDER — LORATADINE 10 MG PO TABS
10.0000 mg | ORAL_TABLET | Freq: Every day | ORAL | Status: DC
  Administered 2012-10-09 – 2012-10-17 (×9): 10 mg via ORAL
  Filled 2012-10-08 (×10): qty 1

## 2012-10-08 MED ORDER — PROCHLORPERAZINE MALEATE 5 MG PO TABS
5.0000 mg | ORAL_TABLET | Freq: Four times a day (QID) | ORAL | Status: DC | PRN
  Filled 2012-10-08: qty 2

## 2012-10-08 MED ORDER — PREDNISONE 10 MG PO TABS
10.0000 mg | ORAL_TABLET | Freq: Every day | ORAL | Status: DC
  Filled 2012-10-08: qty 1

## 2012-10-08 MED ORDER — MOMETASONE FURO-FORMOTEROL FUM 200-5 MCG/ACT IN AERO
2.0000 | INHALATION_SPRAY | Freq: Two times a day (BID) | RESPIRATORY_TRACT | Status: DC
  Administered 2012-10-08 – 2012-10-17 (×18): 2 via RESPIRATORY_TRACT
  Filled 2012-10-08: qty 8.8

## 2012-10-08 MED ORDER — MAGNESIUM CHLORIDE 64 MG PO TBEC
1.0000 | DELAYED_RELEASE_TABLET | Freq: Two times a day (BID) | ORAL | Status: DC
  Administered 2012-10-08 – 2012-10-17 (×18): 64 mg via ORAL
  Filled 2012-10-08 (×20): qty 1

## 2012-10-08 MED ORDER — GUAIFENESIN-DM 100-10 MG/5ML PO SYRP: 5.0000 mL | ORAL_SOLUTION | Freq: Four times a day (QID) | ORAL | Status: DC | PRN

## 2012-10-08 MED ORDER — LEVALBUTEROL TARTRATE 45 MCG/ACT IN AERO
1.0000 | INHALATION_SPRAY | Freq: Four times a day (QID) | RESPIRATORY_TRACT | Status: DC | PRN
  Filled 2012-10-08: qty 15

## 2012-10-08 MED ORDER — FLEET ENEMA 7-19 GM/118ML RE ENEM: 1.0000 | ENEMA | Freq: Once | RECTAL | Status: AC | PRN

## 2012-10-08 MED ORDER — IPRATROPIUM-ALBUTEROL 20-100 MCG/ACT IN AERS
2.0000 | INHALATION_SPRAY | Freq: Two times a day (BID) | RESPIRATORY_TRACT | Status: DC
  Administered 2012-10-08 – 2012-10-17 (×17): 2 via RESPIRATORY_TRACT
  Filled 2012-10-08 (×2): qty 4

## 2012-10-08 MED ORDER — ATORVASTATIN CALCIUM 20 MG PO TABS
20.0000 mg | ORAL_TABLET | Freq: Every day | ORAL | Status: DC
  Administered 2012-10-09 – 2012-10-17 (×9): 20 mg via ORAL
  Filled 2012-10-08 (×10): qty 1

## 2012-10-08 MED ORDER — TRAZODONE HCL 50 MG PO TABS: 25.0000 mg | ORAL_TABLET | Freq: Every evening | ORAL | Status: DC | PRN

## 2012-10-08 NOTE — Progress Notes (Signed)
Patient ID: Richard Chavez, male   DOB: 19-Feb-1927, 79 y.o.   MRN: [id number]    Subjective: No complaints, denies n/v/pain, hungy.  Large BM this am  Objective: Vital signs in last 24 hours: Temp:  [97.4 F (36.3 C)-97.5 F (36.4 C)] 97.4 F (36.3 C) (06/04 0444) Pulse Rate:  [61-112] 71 (06/04 0444) Resp:  [16-18] 18 (06/04 0444) BP: (126-137)/(57-88) 137/88 mmHg (06/04 0444) SpO2:  [93 %-99 %] 97 % (06/04 0444) Weight:  [235 lb 7.2 oz (106.8 kg)] 235 lb 7.2 oz (106.8 kg) (06/04 0444) Last BM Date: 10/07/12  Intake/Output from previous day: 06/03 0701 - 06/04 0700 In: 723 [P.O.:720; I.V.:3] Out: 751 [Urine:750; Stool:1] Intake/Output this shift:   General: NAD GI: overall soft but obese, +BS, nontender to palp  Lab Results:  No results found for this basename: WBC, HGB, HCT, PLT,  in the last 72 hours BMET  Recent Labs  10/07/12 0530 10/08/12 0552  NA 138 137  K 2.9* 3.7  CL 103 106  CO2 26 25  GLUCOSE 103* 122*  BUN 27* 22  CREATININE 1.29 1.24  CALCIUM 8.7 8.6   PT/INR  Recent Labs  10/07/12 0530 10/08/12 0552  LABPROT 14.2 17.6*  INR 1.11 1.49   ABG No results found for this basename: PHART, PCO2, PO2, HCO3,  in the last 72 hours  Studies/Results: Dg Abd 1 View  10/06/2012   *RADIOLOGY REPORT*  Clinical Data: Follow-up small bowel obstruction.  ABDOMEN - 1 VIEW  Comparison: 10/05/2012  Findings: There are loops of dilated small bowel without colonic distention, findings without substantial change from the prior exam allowing for differences in radiographic technique.  The findings remain consistent with a partial small bowel obstruction.  No gross free air on the supine study.  There is atherosclerotic calcification along the abdominal aorta and iliac arteries and evidence of a small fusiform infrarenal abdominal aortic aneurysm measuring approximate 3.6 cm in diameter.  IMPRESSION: Persistent partial small bowel obstruction.   Original Report Authenticated  By: Amie Portland, M.D.    Anti-infectives: Anti-infectives   Start     Dose/Rate Route Frequency Ordered Stop   10/06/12 2100  vancomycin (VANCOCIN) 1,500 mg in sodium chloride 0.9 % 500 mL IVPB  Status:  Discontinued     1,500 mg 250 mL/hr over 120 Minutes Intravenous Every 24 hours 10/06/12 1518 10/06/12 1815   10/03/12 2300  vancomycin (VANCOCIN) IVPB 1000 mg/200 mL premix  Status:  Discontinued     1,000 mg 200 mL/hr over 60 Minutes Intravenous Every 24 hours 10/03/12 1030 10/06/12 1517   10/02/12 1500  vancomycin (VANCOCIN) 1,500 mg in sodium chloride 0.9 % 500 mL IVPB  Status:  Discontinued     1,500 mg 250 mL/hr over 120 Minutes Intravenous Every 24 hours 10/01/12 1245 10/03/12 1030   10/01/12 1330  vancomycin (VANCOCIN) 2,000 mg in sodium chloride 0.9 % 500 mL IVPB     2,000 mg 250 mL/hr over 120 Minutes Intravenous  Once 10/01/12 1244 10/01/12 1636   10/01/12 1300  piperacillin-tazobactam (ZOSYN) IVPB 3.375 g  Status:  Discontinued     3.375 g 12.5 mL/hr over 240 Minutes Intravenous 3 times per day 10/01/12 1245 10/07/12 1816      Assessment/Plan: p SBO improving. Increase diet to regular diet today Moving bowels. Will sign off today, call with questions or concerns.    LOS: 7 days    Aariel Ems 10/08/2012

## 2012-10-08 NOTE — Progress Notes (Signed)
Speech Language Pathology Dysphagia Treatment Patient Details Name: Richard Chavez MRN: [id number] DOB: 13-Mar-1927 Today's Date: 10/08/2012 Time: 1540-1600 SLP Time Calculation (min): 20 min  Assessment / Plan / Recommendation Clinical Impression  Treatment session focused on observation of diet to[place] given report of difficulty yesterday. Today pt observed with self feeding of regular solid and thin liquids with some mixed consistency. No evidence of aspiration though there was prolonged mastication and pocketing, inattention to PO. Recommend pt downgrade back to dys 2/thin and f/u with SLP on CIR for advancement.     Diet Recommendation  Continue with Current Diet: Dysphagia 2 (fine chop);Thin liquid    SLP Plan Continue with current plan of care   Pertinent Vitals/Pain NA   Swallowing Goals  SLP Swallowing Goals Patient will utilize recommended strategies during swallow to increase swallowing safety with: Minimal cueing Swallow Study Goal #2 - Progress: Progressing toward goal  General Temperature Spikes Noted: No Respiratory Status: Room air Behavior/Cognition: Alert;Cooperative;Pleasant mood;Requires cueing Oral Cavity - Dentition: Edentulous Patient Positioning: Upright in chair  Oral Cavity - Oral Hygiene     Dysphagia Treatment Treatment focused on: Skilled observation of diet tolerance;Utilization of compensatory strategies Treatment Methods/Modalities: Skilled observation Patient observed directly with PO's: Yes Type of PO's observed: Regular;Thin liquids Feeding: Able to feed self Liquids provided via: Straw Oral Phase Signs & Symptoms: Right pocketing;Prolonged mastication;Prolonged oral phase Type of cueing: Verbal Amount of cueing: Moderate   GO     Nohemy Koop, Riley Nearing 10/08/2012, 4:16 PM

## 2012-10-08 NOTE — Progress Notes (Signed)
Occupational Therapy Treatment Patient Details Name: Richard Chavez MRN: [id number] DOB: 1926-07-16 Today's Date: 10/08/2012 Time: 7829-5621 OT Time Calculation (min): 35 min  OT Assessment / Plan / Recommendation Comments on Treatment Session Pt limited by decreased activity tolerance with HR to 122 with short walk from bathroom to chair.  Pt needs redirection to complete tasks, distracted and talkative.  Reports L UE sensation is improving, but continues to need cues to protect it from injury.    Follow Up Recommendations  CIR    Barriers to Discharge       Equipment Recommendations       Recommendations for Other Services    Frequency Min 3X/week   Plan Discharge plan remains appropriate    Precautions / Restrictions Precautions Precautions: Fall Precaution Comments: Pt with some proximal but limited distal control of L LE Restrictions Weight Bearing Restrictions: No   Pertinent Vitals/Pain HR to 122 with ambulation short distances    ADL  Grooming: Wash/dry hands;Minimal assistance;Brushing hair Where Assessed - Grooming: Supported standing Upper Body Dressing: Minimal assistance Where Assessed - Upper Body Dressing: Unsupported sitting Toilet Transfer: Moderate assistance Toilet Transfer Method: Sit to stand Toilet Transfer Equipment: Comfort height toilet;Grab bars Toileting - Clothing Manipulation and Hygiene: Set up Where Assessed - Engineer, mining and Hygiene: Lean right and/or left Transfers/Ambulation Related to ADLs: Mod assist, pt's R arm around OTs waist ADL Comments: Worked on hemitechniques for dressing.  Good incorporation of L UE in attempt to use grab bar, in hand washing, and in manipulating news paper.    OT Diagnosis:    OT Problem List:   OT Treatment Interventions:     OT Goals ADL Goals Pt Will Perform Eating: with set-up;Sitting, chair ADL Goal: Eating - Progress: Met Pt Will Perform Grooming: with set-up;Sitting, edge of  bed ADL Goal: Grooming - Progress: Progressing toward goals Pt Will Perform Upper Body Bathing: with min assist;Sitting, edge of bed;with cueing (comment type and amount) Pt Will Perform Upper Body Dressing: with min assist;Sitting, bed;with cueing (comment type and amount) ADL Goal: Upper Body Dressing - Progress: Progressing toward goals Pt Will Transfer to Toilet: with min assist;3-in-1;Stand pivot transfer ADL Goal: Toilet Transfer - Progress: Progressing toward goals Pt Will Perform Toileting - Clothing Manipulation: Standing;with mod assist ADL Goal: Toileting - Clothing Manipulation - Progress: Progressing toward goals Pt Will Perform Toileting - Hygiene: with min assist;Leaning right and/or left on 3-in-1/toilet ADL Goal: Toileting - Hygiene - Progress: Met Miscellaneous OT Goals Miscellaneous OT Goal #1: Pt will perform bed mobility to EOB with min guard assist in prep for ADL. Miscellaneous OT Goal #2: Pt will position L UE during mobility, in bed, and in chair to protect from injury with min verbal cues. OT Goal: Miscellaneous Goal #2 - Progress: Not met  Visit Information  Last OT Received On: 10/08/12 Assistance Needed: +1    Subjective Data      Prior Functioning       Cognition  Cognition Arousal/Alertness: Awake/alert Behavior During Therapy: WFL for tasks assessed/performed Overall Cognitive Status: Impaired/Different from baseline Area of Impairment: Safety/judgement;Memory Memory: Decreased short-term memory Safety/Judgement: Decreased awareness of deficits General Comments: Needs verbal cues to protect L UE from harm.      Mobility  Bed Mobility Bed Mobility: Not assessed Transfers Transfers: Sit to Stand Sit to Stand: 3: Mod assist;With upper extremity assist;From toilet;From chair/3-in-1 Stand to Sit: 3: Mod assist;To chair/3-in-1;To toilet Details for Transfer Assistance: uses momentum  Exercises  General Exercises - Upper Extremity Shoulder  Flexion: AAROM;10 reps;Seated;Left Shoulder Extension: AAROM;Seated;10 reps;Left Shoulder ABduction: AAROM;10 reps;Left;Seated Shoulder Horizontal ABduction: AAROM;10 reps;Seated;Left Shoulder Horizontal ADduction: AAROM;10 reps;Seated;Left Elbow Flexion: AAROM;10 reps;Left;Seated Elbow Extension: AAROM;Left;10 reps;Seated Wrist Flexion: AAROM;5 reps;Seated;Left Wrist Extension: 5 reps;Seated;Left;AAROM Digit Composite Flexion: AROM;10 reps;Seated;Left Composite Extension: AROM;5 reps;Seated;Left   Balance Static Standing Balance Static Standing - Balance Support: No upper extremity supported Static Standing - Level of Assistance: 4: Min assist Static Standing - Comment/# of Minutes: 2   End of Session OT - End of Session Activity Tolerance: Patient limited by fatigue (HR to 122 with ambulation from bathroom) Patient left: in chair;with call bell/phone within reach Nurse Communication:  (elevated HR)  GO     Evern Bio 10/08/2012, 11:07 AM [phone]

## 2012-10-08 NOTE — PMR Pre-admission (Signed)
PMR Admission Coordinator Pre-Admission Assessment  Patient: Richard Chavez is an 79 y.o., male MRN: [id number] DOB: [birth year] Height: 6\' 2"  (188 cm) Weight: 106.8 kg (235 lb 7.2 oz) (scale B )              Insurance Information Primary: Medicare Policy#: [id number] Subscriber: Patient Benefits: [employer] Effective Date: 11/04/1993 Deductible: $1126 OOP Max: None Life Max: Unlimited CIR: 100% SNF: 100 days LBD:  Outpatient: 80% Copay: 20%  Home Health: 100% DME: 80%  Copay: 20% Providers: Patient's choice SECONDARY: [employer]      Policy#: [id number]      Subscriber: self  Emergency Contact Information Contact Information   Name Relation Home Work [place] [phone]       Current Medical History  Patient Admitting Diagnosis: 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 male with history of GERD, COPD, CAD, advanced systolic and diastolic heart failure, recent admission for PNA who was readmitted on 10/01/12 with SOB and BLE edema. He was noted to have LUE weakness with numbness and family reported confusion as well as difficulty walking. He was treated with IV diuretics for acute on chronic CHF with improvement in respiratory status and LE edema. CT head done per neurology input due to concerns of subcortical stroke. CT head without acute changes and stable mild atrophy with white matter ischemic changes. He was changed to plavix per neurology recommendations.   10/08/12 Update: Carotid dopplers with right 40-59% ICA stenosis and L-60-79% ICA stenosis. He reported problems with constipation and was noted to have SBO. Dr. Derrell Lolling consulted and recommended NGT to low suction. Atrial fibrillation with RVR treated with IV metoprolol and cardiology recommended changing patient to eliquis. Treated with IV vanc/ zosyn for potential aspiration PNA. GI symptoms improving and diet slowly initiated to  D3. Patient continues with left sided weakness, distractibility needing redirection as well as intermittent confusion. He needs full supervision to follow aspiration precautions with oral difficulties--may need diet downgrade.   Total: 14=NIH   Past Medical History  Past Medical History  Diagnosis Date  . CAD (coronary artery disease), native coronary artery     Status post CABG  . Hypertension   . Hyperlipidemia   . GERD (gastroesophageal reflux disease)   . COPD (chronic obstructive pulmonary disease)   . CRF (chronic renal failure)   . Barrett's esophagus   . OP (osteoporosis)   . CAD (coronary artery disease) of artery bypass graft 07/04/2011    LIMA-LAD; SVG-OM2-OM3, SVG-RCA -- all patent as of 05/27/11  . Syncope 07/04/2011  . Pacemaker 07/04/2011  . Cardiomyopathy, ischemic 07/07/2011    2D Echo - EF 35-40, moderately dilated left atrium  . Myocardial infarction     25 yrs ago  . Peripheral vascular disease   . Arthritis   . BPH (benign prostatic hyperplasia)   . Polyp of colon     removed  . Sleep apnea     STOP BANG SCORE 4  . AV block, 3rd degree     Post St. Jude pacemaker, EF 35-40, does have intermittent atrial tachycardia, either PAt or Afib  . BPH (benign prostatic hypertrophy) 11/15/2011  . SOB (shortness of breath) on exertion 02/14/2010    2D Echo - EF 35-45,    Family History  family history includes Alzheimer's disease in his maternal grandmother and mother; Arthritis in his mother; Heart disease in his father; Leukemia  in his father; and Lung cancer (age of onset: 88) in his son.  Prior Rehab/Hospitalizations:none   Current Medications  Current facility-administered medications:apixaban (ELIQUIS) tablet 5 mg, 5 mg, Oral, BID, Brittainy Simmons, PA-C, 5 mg at 10/08/12 1610;  atorvastatin (LIPITOR) tablet 20 mg, 20 mg, Oral, Q breakfast, Rhetta Mura, MD, 20 mg at 10/08/12 0851;  docusate sodium (COLACE) capsule 100 mg, 100 mg, Oral, Daily, Marykay Lex, MD, 100 mg at 10/08/12 0851 gabapentin (NEURONTIN) capsule 600 mg, 600 mg, Oral, TID, Rhetta Mura, MD, 600 mg at 10/08/12 0852;  guaiFENesin (MUCINEX) 12 hr tablet 1,200 mg, 1,200 mg, Oral, BID, Rhetta Mura, MD, 1,200 mg at 10/08/12 0852;  hydrocortisone sodium succinate (SOLU-CORTEF) 100 mg/2 mL injection 25 mg, 25 mg, Intravenous, Q12H, Elease Etienne, MD, 25 mg at 10/08/12 1405 Ipratropium-Albuterol (COMBIVENT) respimat 2 puff, 2 puff, Inhalation, BID, Rhetta Mura, MD, 2 puff at 10/08/12 0849;  loratadine (CLARITIN) tablet 10 mg, 10 mg, Oral, Q breakfast, Rhetta Mura, MD, 10 mg at 10/08/12 0850;  magnesium chloride (SLOW-MAG) 64 MG SR tablet 64 mg, 1 tablet, Oral, BID, Rhetta Mura, MD, 64 mg at 10/08/12 0854 magnesium hydroxide (MILK OF MAGNESIA) suspension 5 mL, 5 mL, Oral, Daily PRN, Gerome Apley [place]uk, PA-C, 5 mL at 10/03/12 0146;  metoprolol (LOPRESSOR) tablet 50 mg, 50 mg, Oral, BID, Brittainy Simmons, PA-C, 50 mg at 10/08/12 0853;  ondansetron (ZOFRAN) injection 4 mg, 4 mg, Intravenous, Q8H PRN, Gerome Apley [place], PA-C, 4 mg at 10/02/12 2008;  sodium chloride 0.9 % injection 3 mL, 3 mL, Intravenous, Q12H, Elease Etienne, MD, 3 mL at 10/08/12 9604 Vitamin D (Ergocalciferol) (DRISDOL) capsule 50,000 Units, 50,000 Units, Oral, Q7 days, Rhetta Mura, MD  Patients Current Diet: Cardiac, Dys 3 diet  Precautions / Restrictions Precautions Precautions: Fall Precaution Comments: Pt with some proximal but limited distal control of L LE Restrictions Weight Bearing Restrictions: No Other Position/Activity Restrictions: left hemiplegic arm -- prop on pillows, keep hand elevated above elbow   Prior Activity Level Community (5-7x/wk): Active daily Home Assistive Devices / Equipment Home Assistive Devices/Equipment: Eyeglasses Home Adaptive Equipment: Walker - rolling;Straight cane;Bedside commode/3-in-1;Grab bars in shower  Prior Functional  Level Prior Function Level of Independence: Independent Able to Take Stairs?: Yes Driving: Yes  Current Functional Level Cognition  Arousal/Alertness: Awake/alert Overall Cognitive Status: Impaired/Different from baseline Memory: Decreased short-term memory Orientation Level: Oriented to place;Oriented to situation;Oriented to person;Disoriented to time Safety/Judgement: Decreased awareness of deficits General Comments: Needs verbal cues to protect L UE from harm.      Extremity Assessment (includes Sensation/Coordination)  RUE ROM/Strength/Tone: Within functional levels RUE Coordination: WFL - gross/fine motor  RLE ROM/Strength/Tone: Deficits RLE ROM/Strength/Tone Deficits: At least 3/5 gross with functional mobilty and standing    ADLs  Eating/Feeding: Performed;Set up Where Assessed - Eating/Feeding: Chair Grooming: Wash/dry hands;Minimal assistance;Brushing hair Where Assessed - Grooming: Supported standing Upper Body Bathing: Maximal assistance Where Assessed - Upper Body Bathing: Unsupported sitting Lower Body Bathing: +1 Total assistance Where Assessed - Lower Body Bathing: Unsupported sitting;Supported sit to stand Upper Body Dressing: Minimal assistance Where Assessed - Upper Body Dressing: Unsupported sitting Lower Body Dressing: +1 Total assistance Where Assessed - Lower Body Dressing: Unsupported sitting;Supported sit to stand Toilet Transfer: Moderate assistance Toilet Transfer: Patient Percentage: 70% Toilet Transfer Method: Sit to stand Toilet Transfer Equipment: Comfort height toilet;Grab bars Toileting - Clothing Manipulation and Hygiene: Set up Where Assessed - Engineer, mining and Hygiene: Lean right and/or left Equipment Used:  Gait belt Transfers/Ambulation Related to ADLs: Mod assist, pt's R arm around OTs waist ADL Comments: Worked on hemitechniques for dressing.  Good incorporation of L UE in attempt to use grab bar, in hand washing,  and in manipulating news paper.    Mobility  Bed Mobility: Not assessed Rolling Right: 3: Mod assist Right Sidelying to Sit: 3: Mod assist Supine to Sit: 4: Min assist;HOB elevated    Transfers  Transfers: Sit to Stand;Stand to Sit Sit to Stand: 3: Mod assist;With upper extremity assist;From toilet;From chair/3-in-1 Sit to Stand: Patient Percentage: 60% Stand to Sit: 3: Mod assist;To chair/3-in-1;To toilet Stand to Sit: Patient Percentage: 50% Stand Pivot Transfers: 1: +2 Total assist;From elevated surface Stand Pivot Transfers: Patient Percentage: 60%    Ambulation / Gait / Stairs / Wheelchair Mobility  Ambulation/Gait Ambulation/Gait Assistance: 3: Mod assist Ambulation/Gait: Patient Percentage: 60% Ambulation Distance (Feet): 40 Feet Assistive device: 1 person hand held assist Ambulation/Gait Assistance Details: side-by-side assist with guard arm around waist for balance control especially with turning; verbal cues to stand tall, look forward, increase RIGHT step length Gait Pattern: Step-through pattern Gait velocity: decreased General Gait Details: short steps, minimal step-through with cues, impulsive turning Wheelchair Mobility Wheelchair Mobility: No    Posture / Balance Static Sitting Balance Static Sitting - Balance Support: Feet supported Static Sitting - Level of Assistance: 5: Stand by assistance Static Sitting - Comment/# of Minutes: 3 Static Standing Balance Static Standing - Balance Support: No upper extremity supported Static Standing - Level of Assistance: 4: Min assist Static Standing - Comment/# of Minutes: 15 second cycle with rhomberg eyes open/eyes closed x 3 cycles with increase sway and rapid leg fatigue requiring rest. Tandem Stance - Right Leg:  (30 seconds in semi-tandem) Tandem Stance - Left Leg:  (15 seconds in semi tandem L leg forward)    Special needs/care consideration Continuous Drip IV: no Skin: Skin tears (L) forearm and (R) upper arm  with foam dressings Bowel mgmt: LBM 10/07/12 Bladder mgmt: Continent Safety: Poor safety awareness    Previous Home Environment Living Arrangements: Children Lives With: Son Available Help at Discharge: Family Type of Home: House Home Layout: One level Home Access: Stairs to enter Secretary/administrator of Steps: 1 Bathroom Shower/Tub: Health visitor: Standard Home Care Services: No  Discharge Living Setting Plans for Discharge Living Setting: Patient's home;Lives with Son, [name] Type of Home at Discharge: House Discharge Home Layout: One level Discharge Home Access: Stairs to enter Entrance Stairs-Rails: None Entrance Stairs-Number of Steps: 1 Discharge Bathroom Shower/Tub: Tub/shower unit Discharge Bathroom Toilet: Standard Discharge Bathroom Accessibility: Yes How Accessible: Accessible via walker Do you have any problems obtaining your medications?: No  Social/Family/Support Systems Patient Roles: Parent (Widowed) Contact Information: Home: [phone] Anticipated Caregiver: Son: Nicolas Banh Anticipated Caregiver's Contact Information: [phone] Ability/Limitations of Caregiver: none Caregiver Availability: 24/7 Discharge Plan Discussed with Primary Caregiver: Yes Is Caregiver In Agreement with Plan?: Yes Does Caregiver/Family have Issues with Lodging/Transportation while Pt is in Rehab?: Yes (Does not drive but can get a ride at times.)  Goals/Additional Needs Patient/Family Goal for Rehab: Modified independent - Min assistance Expected length of stay: 1-2 weeks Cultural Considerations: none Equipment Needs: TBD Additional Information: Patient and his son will need education re: pt not driving after d/c Pt/Family Agrees to Admission and willing to participate: Yes Program Orientation Provided & Reviewed with Pt/Caregiver Including Roles  & Responsibilities: Yes  Decrease burden of Care through IP rehab admission: n/a  Possible need for  SNF placement  upon discharge: Not anticipated  Patient Condition: This patient's medical and functional status has changed since the consult dated: 10/02/12 in which the Rehabilitation Physician determined and documented that the patient's condition is appropriate for intensive rehabilitative care in an inpatient rehabilitation facility. Medical changes are: See above under "History of Present Illness".  Functional changes are: Patient is ambulating 63' with mod assist, transfers with mod assist. After evaluating the patient today and speaking with the Rehabilitation physician and acute team, the patient remains appropriate for inpatient rehab. Will admit to inpatient rehab today.  Preadmission Screen Completed By:  Meryl Dare, 10/08/2012 2:23 PM ______________________________________________________________________   Discussed status with Dr. Riley Kill on 10/08/12 at 1520 and received telephone approval for admission today.  Admission Coordinator:  Meryl Dare, time 1525/Date 10/10/12

## 2012-10-08 NOTE — Discharge Summary (Signed)
Physician Discharge Summary  Richard Chavez:[id number] DOB: 12/25/1926 DOA: 10/01/2012  PCP: Thayer Headings, MD  Admit date: 10/01/2012 Discharge date: 10/08/2012  Time spent: Greater than 30 minutes  Recommendations for Outpatient Follow-up:  -To CIR.   Discharge Diagnoses:  Principal Problem:   Stroke, lacunar Active Problems:   Hypertension   NSVT    COPD (chronic obstructive pulmonary disease)   CAD CABG 1998, patent grafts 06/06/11 after abn Myoview   Pacemaker, St Jude, implanted 2008   Cardiomyopathy, ischemic, EF 30-35% by recent cath   Chronic combined systolic and diastolic congestive heart failure   CKD (chronic kidney disease) stage 2, GFR 60-89 ml/min   OSA (obstructive sleep apnea)   Chronic kidney disease, stage III (moderate)   Acute on chronic combined systolic and diastolic congestive heart failure, NYHA class 3   Possible reccuring Aspiration pneumonia, worsened by potential CVA   Atrial fibrillation with RVR   Small bowel obstruction   Renal failure, acute on chronic   Hyperkalemia   Discharge Condition: Stable and improved  Filed Weights   10/06/12 0524 10/07/12 0529 10/08/12 0444  Weight: 103.9 kg (229 lb 0.9 oz) 105.9 kg (233 lb 7.5 oz) 106.8 kg (235 lb 7.2 oz)    History of present illness:  Patient is a 79 y.o. Man with PMH inclusive of CAD, NICM [echo Ef 35-40% 3/13] s/p PPM placement and recent hospitalization from 09/15/12-09/17/12 for community-acquired pneumonia we presented to [facility] emergency room 09/23/12 with weakness and shortness of breath.  Patient is a poor historian at baseline and his son Richard Chavez gives most of the story.  Patient's son states that patient seemed to be struggling behind yesterday and because of this as well as some numbness in his left hand he went to his primary care physician.  Her sons report patient did not receive any antibiotics and was thought that this was probably an early bronchitis. This morning the  patient awoke with chills was feeling weak and had some mild productive sputum as well as self-reported temperature and Patient was brought to [facility] emergency room.  Patient does not report any chest pain or nausea and has to be careful when he eats as if he eats too fast he gets choked up. He has no cough or needing however.  He usually is able to walk about a half mile without issue however he had to walk very slowly into the care physician's office on 5/27 because of weakness.  Patient has not had any seizures or any focal neurological deficit that has been reported percent.  Son states that his usual weight fluctuates between 233 and 240 pounds and he was about 241 pounds yesterday.  Emergency room evaluation revealed normal urine analysis  Chest x-ray showed worsening aeration especially on the right suspicious for alveolar pulmonary edema versus multifocal infection  ABG was performed showing some hypoxia with PA O2 of 72%  B. and creatinine was 20/1.42 and had some mild blood and anemia  ProBNP was 1059  White count was normal. We were asked to admit him for further evaluation and management.   Hospital Course:  1. Subacute right brain infarct: Initial CT imaging was negative but subsequent one showed subacute stroke. Unable to do MRI secondary to pacer. His CVA could be embolic from A. fib-neurology initially recommended holding off anticoagulation for 7-14 days post stroke but stroke M.D.on 6/2 recommended starting anticoagulation right away. Completed stroke workup. Patient switching to Eliquis. To CIR for rehab.  2. Small bowel obstruction: Unclear etiology. Surgery following. Clinically improved and tolerating solid diet.  3. Acute on on chronic kidney disease stage III: Likely precipitated by diuresis. Held Lasix and treated briefly with IV fluids. Resolved.  4. Hyperkalemia: Secondary to acute renal failure and potassium supplements. Hold potassium supplements. IV fluids and  resolved. 5. A. fib with RVR and NSVT: Management per cardiology. Beta blockers will be changed back to by mouth. Anticoagulation as indicated in problem #1. 6. Shortness of breath-multifactorial-potentially aspiration pneumonia +/-CHF exacerbation-patient's dry weight is 233 pounds. Continue daily weights, strict in and out. Clinically dry and patient denies dyspnea. Brief IV fluids and hold Lasix. Compensated. 7. Potential aspiration pneumonia : Agree with empiric Vancomycin/Zosyn for now. CT abdomen showed right middle lobe and lower lobe opacities. Will need followup imaging to ensure resolution. Will get speech therapist input to rule out overt oropharyngeal dysphagia. Oxygen and nebs to continue as needed. Completed 7 days of IV antibiotics-discontinue  4. History of CAD/cardiomyopathy/CHF NYHA class II- cardiology input appreciated. 5. COPD history-continue Combivent inhaler 2 puff twice a day. Continue home dose prednisone 5 mg tablet-changed to IV hydrocortisone secondary to n.p.o.. Continue tiotropium 18 mcg daily every morning. COPD appears to be at baseline. No wheezing. We'll change back to oral prednisone when tolerating diet consistently. 6. Hypotension-seems like a chronic issue. resolved.  7. Hyperlipidemia continue Lipitor 20 mg daily with breakfast. Lipid panel stable. 8. Confusion: Possibly from acute illness-improving. 9. Hypokalemia: Repleted.   Procedures:  None   Consultations:  Surgery  Discharge Instructions     Medication List    TAKE these medications       ADVAIR DISKUS 500-50 MCG/DOSE Aepb  Generic drug:  Fluticasone-Salmeterol  Inhale 1 puff into the lungs every 12 (twelve) hours.     atorvastatin 20 MG tablet  Commonly known as:  LIPITOR  Take 20 mg by mouth daily with breakfast.     COMBIVENT 18-103 MCG/ACT inhaler  Generic drug:  albuterol-ipratropium  Inhale 2 puffs into the lungs 2 (two) times daily.     Fish Oil 1000 MG Caps  Take 1  capsule by mouth daily.     fluticasone 50 MCG/ACT nasal spray  Commonly known as:  FLONASE  Place 2 sprays into the nose 2 (two) times daily as needed. For dryness.     furosemide 40 MG tablet  Commonly known as:  LASIX  Take 1 tablet (40 mg total) by mouth daily with breakfast.     gabapentin 300 MG capsule  Commonly known as:  NEURONTIN  Take 600 mg by mouth 3 (three) times daily.     guaiFENesin 600 MG 12 hr tablet  Commonly known as:  MUCINEX  Take 1,200 mg by mouth 2 (two) times daily.     levofloxacin 750 MG tablet  Commonly known as:  LEVAQUIN  Take 1 tablet (750 mg total) by mouth every other day. Start on 09/18/2012.     loratadine 10 MG tablet  Commonly known as:  CLARITIN  Take 10 mg by mouth daily with breakfast.     metoprolol tartrate 25 MG tablet  Commonly known as:  LOPRESSOR  Take 25 mg by mouth 2 (two) times daily.     potassium chloride 10 MEQ tablet  Commonly known as:  K-DUR  Take 10 mEq by mouth 2 (two) times daily.     predniSONE 5 MG tablet  Commonly known as:  DELTASONE  Take 5 mg by mouth daily.  ranitidine 150 MG capsule  Commonly known as:  ZANTAC  Take 150 mg by mouth 2 (two) times daily.     SLOW-MAG 535 (64 MG) MG Tbcr  Generic drug:  Magnesium Chloride  Take 1 tablet by mouth 2 (two) times daily.     tiotropium 18 MCG inhalation capsule  Commonly known as:  SPIRIVA  Place 18 mcg into inhaler and inhale daily with breakfast.     Vitamin D (Ergocalciferol) 50000 UNITS Caps  Commonly known as:  DRISDOL  Take 50,000 Units by mouth every 7 (seven) days. Take on Fridays     vitamin E 1000 UNIT capsule  Take 1,000 Units by mouth daily with breakfast.       No Known Allergies     Follow-up Information   Follow up with Gates Rigg, MD. Schedule an appointment as soon as possible for a visit in 2 months. (stroke clinic)    Contact information:   [address] [place] [address] [phone]         The results of significant diagnostics from this hospitalization (including imaging, microbiology, ancillary and laboratory) are listed below for reference.    Significant Diagnostic Studies: Ct Abdomen Pelvis Wo Contrast  10/03/2012   *RADIOLOGY REPORT*  Clinical Data: Mid abdominal pain  CT ABDOMEN AND PELVIS WITHOUT CONTRAST  Technique:  Multidetector CT imaging of the abdomen and pelvis was performed following the standard protocol without intravenous contrast.  Comparison: 06/30/2011  Findings: Calcified pleural plaques at the right lung base are stable.  Fibrotic changes at the lung bases are stable.  Ill- defined opacities in the right middle and lower lobe visualized on this study are present.  These are characterized by a focal rounded nodular areas with surrounding ground-glass and interstitial changes.  Several areas are present.  The largest is 2.0 cm in the right middle lobe on image #1.  Coronary artery calcifications.  Pacemaker device is present.  Post cholecystectomy.  Liver, spleen, pancreas, adrenal glands are within normal limits.  Atherosclerotic changes of the aorta are present.  Maximal AP diameter of the aorta is 3.7 cm.  This is not significantly changed.  Non-visualized appendix.  Sigmoid diverticulosis is present without evidence of acute diverticulitis.  Proximal small bowel is dilated with air-fluid levels.  Distal small bowel is completely decompressed.  Colon is decompressed. The focal transition point is not clearly visualized.  The transition point probably occurs in the proximal or mid ileum within the right side of the abdomen.  Foley catheter decompresses the bladder.  Stable prostate gland.  Degenerative disc disease at L4-5 and L5-S1.  Simple cysts in the left kidney are stable.  IMPRESSION: Small bowel obstruction pattern as described.  The exact transition point is not clearly evident in this may be due to adhesions.  There are several ill-defined opacities in the  right middle and lower lobes.  The finding is nonspecific and likely represents an inflammatory process.  Malignancy is not entirely excluded.  Short- term follow-up studies are recommended to ensure resolution of these findings.  If there is strong concern for malignancy, PET CT can be performed.  Stable chronic findings.   Original Report Authenticated By: Jolaine Click, M.D.   Dg Chest 2 View  10/01/2012   *RADIOLOGY REPORT*  Clinical Data: Shortness of breath.  CABG.  CHEST - 2 VIEW  Comparison: 09/15/2012  Findings: Motion and position degraded lateral view.  Aortic atherosclerosis. Pacer with leads at right atrium and right  ventricle.  No lead discontinuity.  Cardiomegaly accentuated by AP portable technique.  No definite pleural fluid. No pneumothorax.  Similar to minimal improvement in interstitial and airspace disease involving the mid and inferior left lung.  Worsened right-sided aeration with lower lobe predominant interstitial and airspace disease. Somewhat to more confluent inferior right upper lobe opacity is favored to represent an area of scarring.  IMPRESSION: Worsened aeration, especially on the right.  Suspicious for alveolar pulmonary edema versus multifocal infection.  Degraded exam, especially the lateral view.   Original Report Authenticated By: Jeronimo Greaves, M.D.   Dg Abd 1 View  10/06/2012   *RADIOLOGY REPORT*  Clinical Data: Follow-up small bowel obstruction.  ABDOMEN - 1 VIEW  Comparison: 10/05/2012  Findings: There are loops of dilated small bowel without colonic distention, findings without substantial change from the prior exam allowing for differences in radiographic technique.  The findings remain consistent with a partial small bowel obstruction.  No gross free air on the supine study.  There is atherosclerotic calcification along the abdominal aorta and iliac arteries and evidence of a small fusiform infrarenal abdominal aortic aneurysm measuring approximate 3.6 cm in diameter.   IMPRESSION: Persistent partial small bowel obstruction.   Original Report Authenticated By: Amie Portland, M.D.   Ct Head Wo Contrast  10/02/2012   *RADIOLOGY REPORT*  Clinical Data: Left arm weakness, fatigue, and headache.  CT HEAD WITHOUT CONTRAST  Technique:  Contiguous axial images were obtained from the base of the skull through the vertex without contrast.  Comparison: 10/01/2012  Findings: Despite efforts by the patient and technologist, motion artifact is present on some series of today's examination and could not be totally eliminated.  This reduces diagnostic sensitivity and specificity.  Cytotoxic edema and hypodensity in a band in the right frontal lobe on images 22-25 of series 2.  Chronic microvascular white matter disease noted.  No intracranial hemorrhage is observed.  Basal ganglia, brain stem, and cerebellum intact.  Atherosclerotic calcification of the carotid siphons noted.  IMPRESSION:  1.  Cytotoxic edema and hypodensity in the vicinity of the inferior frontal gyrus and precentral gyrus on the right, compatible with subacute stroke.  No hemorrhagic transformation observed.   Original Report Authenticated By: Gaylyn Rong, M.D.   Ct Head Wo Contrast  10/01/2012   *RADIOLOGY REPORT*  Clinical Data: Progressive generalized weakness, dizziness  CT HEAD WITHOUT CONTRAST  Technique:  Contiguous axial images were obtained from the base of the skull through the vertex without contrast.  Comparison: Prior CT scan of the head 05/16/2012  Findings: No acute intracranial hemorrhage, acute infarction, mass lesion, mass effect, midline shift or hydrocephalus.  Gray-white differentiation is preserved throughout.  Stable mild generalized atrophy and periventricular hypoattenuation which is nonspecific but most consistent with the sequela of chronic microvascular ischemic change.  The globes and orbits are intact and symmetric bilaterally.  No acute soft tissue or calvarial abnormality. Normal  aeration of the mastoid air cells and visualized paranasal sinuses.  Dense atherosclerotic vascular calcification in the bilateral cavernous carotid arteries.  IMPRESSION:  1.  No acute intracranial abnormality 2.  Stable mild atrophy and chronic white matter ischemic changes 3.  Intracranial atherosclerosis   Original Report Authenticated By: Malachy Moan, M.D.   Ir Fluoro Guide Cv Line Right  10/04/2012   *RADIOLOGY REPORT*  Clinical data: Small bowel obstruction, needs durable venous access.  RIGHT IJ CENTRAL VENOUS CATHETER PLACEMENT UNDER ULTRASOUND AND FLUOROSCOPIC GUIDANCE:  Technique and findings:  The procedure, risks (including  but not limited to bleeding, infection, organ damage), benefits, and alternatives were explained to the the patient's son over the telephone.  Questions regarding the procedure were encouraged and answered.  The family member understands and consents to the procedure. Patency of the right IJ vein was confirmed with ultrasound with image documentation. An appropriate skin site was determined. Skin site was marked. Region was prepped using maximum barrier technique including cap and mask, sterile gown, sterile gloves, large sterile sheet, and Chlorhexidine   as cutaneous antisepsis.  The region was infiltrated locally with 1% lidocaine. Under real-time ultrasound guidance, the right IJ vein was accessed with a 21 gauge micropuncture needle; the needle tip within the vein was confirmed with ultrasound image documentation.  The needle exchanged over a guidewire for peel-away sheath through which an 17cm 5 [place] PowerPICC dual lumen catheter was advanced. This was positioned with the tip at the cavoatrial junction. Spot chest radiograph shows good positioning and no pneumothorax. Catheter was flushed and sutured externally with 0-Prolene sutures. Patient tolerated the procedure well, with no immediate complication. Fluoroscopy time: 48 seconds  IMPRESSION: 1. Technically  successful right IJ double lumen power injectable central venous catheter placement.   Original Report Authenticated By: D. Andria Rhein, MD   Ir US Guide Vasc Access Right  10/04/2012   *RADIOLOGY REPORT*  Clinical data: Small bowel obstruction, needs durable venous access.  RIGHT IJ CENTRAL VENOUS CATHETER PLACEMENT UNDER ULTRASOUND AND FLUOROSCOPIC GUIDANCE:  Technique and findings:  The procedure, risks (including but not limited to bleeding, infection, organ damage), benefits, and alternatives were explained to the the patient's son over the telephone.  Questions regarding the procedure were encouraged and answered.  The family member understands and consents to the procedure. Patency of the right IJ vein was confirmed with ultrasound with image documentation. An appropriate skin site was determined. Skin site was marked. Region was prepped using maximum barrier technique including cap and mask, sterile gown, sterile gloves, large sterile sheet, and Chlorhexidine   as cutaneous antisepsis.  The region was infiltrated locally with 1% lidocaine. Under real-time ultrasound guidance, the right IJ vein was accessed with a 21 gauge micropuncture needle; the needle tip within the vein was confirmed with ultrasound image documentation.  The needle exchanged over a guidewire for peel-away sheath through which an 17cm 5 [place] PowerPICC dual lumen catheter was advanced. This was positioned with the tip at the cavoatrial junction. Spot chest radiograph shows good positioning and no pneumothorax. Catheter was flushed and sutured externally with 0-Prolene sutures. Patient tolerated the procedure well, with no immediate complication. Fluoroscopy time: 48 seconds  IMPRESSION: 1. Technically successful right IJ double lumen power injectable central venous catheter placement.   Original Report Authenticated By: D. Andria Rhein, MD   Dg Chest Port 1 View  09/15/2012   *RADIOLOGY REPORT*  Clinical Data: 79 year old male  weakness.  Pacemaker.  Chronic pulmonary disease.  PORTABLE CHEST - 1 VIEW  Comparison: 05/16/2012 and earlier.  Findings: Portable AP semi upright view at 1141 hours.  New widespread patchy and confluent left lung opacity, including left upper lobe involvement.  Interval decreased right upper lobe airspace disease and increased density along the right minor fissure. Stable cardiomegaly and mediastinal contours.  Visualized tracheal air column is within normal limits.  Stable right chest cardiac pacemaker.  Sequelae of CABG.  IMPRESSION: 1.  New left lung airspace disease suspicious for multilobar pneumonia. 2.  Interval decreased right upper lobe airspace disease and thickening along the right  minor fissure. 3.  Asymmetric pulmonary edema is a less likely consideration in this setting.   Original Report Authenticated By: Erskine Speed, M.D.   Dg Abd Portable 1v  10/05/2012   *RADIOLOGY REPORT*  Clinical Data: SBO  PORTABLE ABDOMEN - 1 VIEW  Comparison: 10/04/2012  Findings: Mildly prominent/dilated loops of small bowel in the mid abdomen, with paucity of colonic bowel gas, compatible with small bowel obstruction.  The caliber of some of the small bowel loops have mildly decreased when compared to the prior study.  IMPRESSION: Findings compatible with small bowel obstruction, although possibly mildly improved.   Original Report Authenticated By: Charline Bills, M.D.   Dg Abd Portable 1v  10/04/2012   *RADIOLOGY REPORT*  Clinical Data: Follow-up small bowel obstruction  PORTABLE ABDOMEN - 1 VIEW  Comparison: CT, 10/03/2012  Findings: Dilated loops of small bowel are noted in the central abdomen consistent with a partial small bowel obstruction and unchanged from the prior CT.  There are calcifications along the abdominal aorta and the iliac arteries.  Surgical clips are noted in the right upper quadrant from a prior cholecystectomy.  A nasogastric tube tip lies in the mid stomach.  The soft tissues are otherwise  unremarkable.  IMPRESSION: Persistent small bowel obstruction, without significant change from the previous day's CT scan.   Original Report Authenticated By: Amie Portland, M.D.   Dg Abd Portable 2v  10/03/2012   *RADIOLOGY REPORT*  Clinical Data: Abdominal pain and vomiting.  PORTABLE ABDOMEN - 2 VIEW  Comparison: CT of the abdomen and pelvis, and abdominal radiograph, performed 06/30/2011  Findings: There is dilatation of small bowel loops to 4.5 cm in maximal diameter, with distension of the stomach.  Associated air- fluid levels are seen.  Findings are concerning for small bowel obstruction, though residual stool is seen within the sigmoid colon.  No free intra-abdominal air is identified on the provided decubitus view.  The visualized lung bases are grossly clear.  The patient is status post median sternotomy.  No acute osseous abnormalities are seen.  IMPRESSION: Dilatation of small bowel loops to 4.5 cm in maximal diameter, with associated distension of the stomach.  Air-fluid levels noted. Findings concerning for small bowel obstruction; no free intra- abdominal air seen.  These results were called by telephone on 10/03/2012 at 05:04 a.m. to [facility] on [phone], who verbally acknowledged these results.   Original Report Authenticated By: Tonia Ghent, M.D.    Microbiology: Recent Results (from the past 240 hour(s))  CULTURE, BLOOD (ROUTINE X 2)     Status: None   Collection Time    10/01/12  1:49 PM      Result Value Range Status   Specimen Description BLOOD RIGHT FOREARM   Final   Special Requests BOTTLES DRAWN AEROBIC ONLY [facility]   Final   Culture  Setup Time 10/01/2012 17:12   Final   Culture NO GROWTH 5 DAYS   Final   Report Status 10/07/2012 FINAL   Final  CULTURE, BLOOD (ROUTINE X 2)     Status: None   Collection Time    10/01/12  1:55 PM      Result Value Range Status   Specimen Description BLOOD THUMB RIGHT   Final   Special Requests BOTTLES DRAWN AEROBIC ONLY 5[facility]   Final    Culture  Setup Time 10/01/2012 17:12   Final   Culture NO GROWTH 5 DAYS   Final   Report Status 10/07/2012 FINAL   Final  MRSA PCR SCREENING     Status: None   Collection Time    10/01/12  4:29 PM      Result Value Range Status   MRSA by PCR NEGATIVE  NEGATIVE Final   Comment:            The GeneXpert MRSA Assay (FDA     approved for NASAL specimens     only), is one component of a     comprehensive MRSA colonization     surveillance program. It is not     intended to diagnose MRSA     infection nor to guide or     monitor treatment for     MRSA infections.     Labs: Basic Metabolic Panel:  Recent Labs Lab 10/03/12 0625 10/04/12 0530 10/05/12 0400 10/06/12 0514 10/07/12 0530 10/08/12 0552  NA 133* 139 137 138 138 137  K 5.4* 3.8 4.0 3.0* 2.9* 3.7  CL 93* 100 101 101 103 106  CO2 26 24 24 26 26 25   GLUCOSE 99 128* 107* 96 103* 122*  BUN 27* 30* 35* 34* 27* 22  CREATININE 1.99* 1.52* 1.35 1.25 1.29 1.24  CALCIUM 9.2 9.1 8.8 8.9 8.7 8.6  MG 2.5  --   --   --   --   --    Liver Function Tests:  Recent Labs Lab 10/02/12 0820 10/03/12 0625  AST 22 54*  ALT 11 13  ALKPHOS 77 61  BILITOT 2.0* 1.6*  PROT 6.3 7.2  ALBUMIN 2.8* 2.8*   No results found for this basename: LIPASE, AMYLASE,  in the last 168 hours No results found for this basename: AMMONIA,  in the last 168 hours CBC:  Recent Labs Lab 10/02/12 0510 10/03/12 0625 10/04/12 0530  WBC 16.2* 14.5* 10.9*  HGB 13.3 14.5 14.1  HCT 38.4* 44.7 41.5  MCV 86.7 87.3 85.4  PLT PLATELET CLUMPS NOTED ON SMEAR, COUNT APPEARS ADEQUATE 206 232   Cardiac Enzymes: No results found for this basename: CKTOTAL, CKMB, CKMBINDEX, TROPONINI,  in the last 168 hours BNP: BNP (last 3 results)  Recent Labs  09/15/12 1130 10/01/12 0849 10/03/12 0625  PROBNP 1760.0* 1059.0* 993.5*   CBG: No results found for this basename: GLUCAP,  in the last 168 hours     Signed:  Chaya Jan  [facility] Pager: [phone] 10/08/2012, 3:14 PM

## 2012-10-08 NOTE — Progress Notes (Signed)
Physical Therapy Treatment Patient Details Name: Richard Chavez MRN: [id number] DOB: 05-Jul-1926 Today's Date: 10/08/2012 Time: 1020-1057 PT Time Calculation (min): 37 min  PT Assessment / Plan / Recommendation Comments on Treatment Session  79 yo s/p cva awaiting transfer to CIR unit presents today with continued intellectual confusion and limited awareness of deficits or medical situation.Continues to require assit for safety and for physical deficits to perform simple mobility and endurance is significantly impaired.  Hopeful he will go to CIR today.    Follow Up Recommendations  CIR     Does the patient have the potential to tolerate intense rehabilitation     Barriers to Discharge        Equipment Recommendations       Recommendations for Other Services    Frequency Min 4X/week   Plan Discharge plan remains appropriate;Frequency remains appropriate    Precautions / Restrictions Precautions Precautions: Fall Precaution Comments: Pt with some proximal but limited distal control of L LE Restrictions Weight Bearing Restrictions: No Other Position/Activity Restrictions: left hemiplegic arm -- prop on pillows, keep hand elevated above elbow   Pertinent Vitals/Pain No pain reported or observed    Mobility  Bed Mobility Bed Mobility: Not assessed Transfers Transfers: Sit to Stand;Stand to Sit Sit to Stand: 3: Mod assist;With upper extremity assist;From toilet;From chair/3-in-1 Sit to Stand: Patient Percentage: 60% Stand to Sit: 3: Mod assist;To chair/3-in-1;To toilet Stand to Sit: Patient Percentage: 50% Details for Transfer Assistance: wants to use left arm to assist with stand/sit.  cues for optimal positioning to acheive lift off.  Cues to control descent and repated X5 with need for seated rest 1/2 way through due to muscle fatigue and increase breathing rate Ambulation/Gait Ambulation/Gait Assistance: 3: Mod assist Ambulation/Gait: Patient Percentage: 60% Ambulation  Distance (Feet): 40 Feet Assistive device: 1 person hand held assist Ambulation/Gait Assistance Details: side-by-side assist with guard arm around waist for balance control especially with turning; verbal cues to stand tall, look forward, increase RIGHT step length Gait Pattern: Step-through pattern General Gait Details: short steps, minimal step-through with cues, impulsive turning    Exercises General Exercises - Upper Extremity Shoulder Flexion: AAROM;10 reps;Seated;Left Shoulder Extension: AAROM;Seated;10 reps;Left Shoulder ABduction: AAROM;10 reps;Left;Seated Shoulder Horizontal ABduction: AAROM;10 reps;Seated;Left Shoulder Horizontal ADduction: AAROM;10 reps;Seated;Left Elbow Flexion: AAROM;10 reps;Left;Seated Elbow Extension: AAROM;Left;10 reps;Seated Wrist Flexion: AAROM;5 reps;Seated;Left Wrist Extension: 5 reps;Seated;Left;AAROM Digit Composite Flexion: AROM;10 reps;Seated;Left Composite Extension: AROM;5 reps;Seated;Left General Exercises - Lower Extremity Ankle Circles/Pumps: Strengthening;Both;10 reps;Seated Quad Sets: AROM;Both;5 reps;Seated (hold for count of 5 -- needs constant cues to attend) Long Arc Quad: Strengthening;Both;10 reps;Seated   PT Diagnosis:    PT Problem List:   PT Treatment Interventions:     PT Goals Acute Rehab PT Goals PT Goal: Sit to Stand - Progress: Progressing toward goal PT Goal: Stand to Sit - Progress: Progressing toward goal PT Transfer Goal: Bed to Chair/Chair to Bed - Progress: Progressing toward goal PT Goal: Stand - Progress: Progressing toward goal PT Goal: Ambulate - Progress: Progressing toward goal  Visit Information  Last PT Received On: 10/08/12 Assistance Needed: +1    Subjective Data  Subjective: "I'm not sure what I'm doing.  They told me I'm going home"   Cognition  Cognition Arousal/Alertness: Awake/alert Behavior During Therapy: Impulsive Overall Cognitive Status: Impaired/Different from baseline Area of  Impairment: Safety/judgement;Awareness;Memory Memory: Decreased short-term memory Safety/Judgement: Decreased awareness of deficits General Comments: Needs verbal cues to protect L UE from harm.      Balance  Balance  Balance Assessed: Yes Static Standing Balance Static Standing - Balance Support: No upper extremity supported Static Standing - Level of Assistance: 4: Min assist Static Standing - Comment/# of Minutes: 15 second cycle with rhomberg eyes open/eyes closed x 3 cycles with increase sway and rapid leg fatigue requiring rest.  End of Session PT - End of Session Equipment Utilized During Treatment: Gait belt Activity Tolerance: Patient limited by fatigue;Patient tolerated treatment well Patient left: in chair;with call bell/phone within reach Nurse Communication: Mobility status   GP     Dennis Bast 10/08/2012, 11:08 AM

## 2012-10-08 NOTE — Progress Notes (Signed)
Pt to be discharged soon will go to in pt Rehab on 4000. Bed assignment 4033. Called report to [facility] [employer].

## 2012-10-08 NOTE — Progress Notes (Signed)
OK per MD for d/c today to CIR; they have accepted patient. CSW will sign off as SNF placement is not indicated.   Lorri Frederick. [place] Pugh  [phone]

## 2012-10-08 NOTE — H&P (Signed)
Physical Medicine and Rehabilitation Admission H&P    Chief Complaint  Patient presents with  . Weakness  : HPI: LEELYN JASINSKI is a 79 y.o. male with history of GERD, COPD, CAD, advanced systolic and diastolic heart failure, recent admission for PNA who was readmitted on 10/01/12 with SOB and BLE edema. He was noted to have LUE weakness with numbness and family reported confusion as well as difficulty walking. He was treated with IV diuretics for acute on chronic CHF with improvement in respiratory status and LE edema. CT head done per neurology input due to concerns of subcortical stroke. CT head without acute changes and stable mild atrophy with white matter ischemic changes.  Carotid dopplers with right 40-59% ICA stenosis and L-60-79% ICA stenosis. He was changed to plavix per neurology recommendations. He reported problems with constipation and was noted to have SBO. Dr. Derrell Lolling consulted and recommended NGT to low suction. Atrial fibrillation with RVR treated with IV metoprolol and cardiology recommended changing patient to eliquis.  Treated with IV vanc/ zosyn for potential aspiration PNA. GI symptoms improving and diet slowly initiated to D3. Patient continues with left sided weakness, distractibility needing redirection as well as intermittent confusion. He needs full supervision to follow aspiration precautions with oral difficulties--may need diet downgrade. Therapy team recommending CIR and patient admitted today.   Review of Systems  Constitutional: Negative for fever and chills.  HENT: Negative for hearing loss, neck pain and tinnitus.   Eyes: Negative for blurred vision and double vision.  Respiratory: Positive for cough.   Gastrointestinal: Negative for heartburn, nausea and vomiting.  Musculoskeletal: Negative for myalgias.  Skin: Negative for itching and rash.  Neurological: Positive for tremors and focal weakness. Negative for headaches.  Psychiatric/Behavioral: Positive for  memory loss. Negative for depression and suicidal ideas.   Past Medical History  Diagnosis Date  . CAD (coronary artery disease), native coronary artery     Status post CABG  . Hypertension   . Hyperlipidemia   . GERD (gastroesophageal reflux disease)   . COPD (chronic obstructive pulmonary disease)   . CRF (chronic renal failure)   . Barrett's esophagus   . OP (osteoporosis)   . CAD (coronary artery disease) of artery bypass graft 07/04/2011    LIMA-LAD; SVG-OM2-OM3, SVG-RCA -- all patent as of 05/27/11  . Syncope 07/04/2011  . Pacemaker 07/04/2011  . Cardiomyopathy, ischemic 07/07/2011    2D Echo - EF 35-40, moderately dilated left atrium  . Myocardial infarction     25 yrs ago  . Peripheral vascular disease   . Arthritis   . BPH (benign prostatic hyperplasia)   . Polyp of colon     removed  . Sleep apnea     STOP BANG SCORE 4  . AV block, 3rd degree     Post St[employer] Jude pacemaker, EF 35-40, does have intermittent atrial tachycardia, either PAt or Afib  . BPH (benign prostatic hypertrophy) 11/15/2011  . SOB (shortness of breath) on exertion 02/14/2010    2D Echo - EF 35-45,    Past Surgical History  Procedure Laterality Date  . Pacemaker placement  2007  . Cholecystectomy  2005  . Coronary artery bypass graft  1978  . Lesion excision      from penis  . Cardiac catheterization  06/06/2011    occluded proximal RCA, ostial LAD and mid Circumflex after OM 1.; [place] LIMA-LAD, patent SVG-OM1-OM 2, patent SVG-RCA. Reduced ejection fraction of 30-35%.  . Cardiac catheterization  06/14/2010  LIMA to LAD, SVG to RCA, SVG to OM1 Circumflex, 100% occluded RCA, 100% occluded circumfkex, 100% occluded LAD, no evidence of graft dysfunction, continue medical therapy  . Cardiac catheterization  04/12/2003    3 vessel coronary artery disease, continue medical treatment  . Cardiac catheterization  10/07/2000    Severe 3 vessel coronary artery disease, continue medical treatment   Family History   Problem Relation Age of Onset  . Leukemia Father   . Heart disease Father   . Alzheimer's disease Mother   . Arthritis Mother   . Lung cancer Son 31  . Alzheimer's disease Maternal Grandmother    Social History: Lives with son. Son provides supervision/ assistance as needed. He retired from [employer] cigarettes" reports that he quit smoking about 27 years ago. His smoking use included Cigarettes. He has a 4.5 pack-year smoking history. He has never used smokeless tobacco. He reports that he does not drink alcohol or use illicit drugs.    Allergies: No Known Allergies  Medications Prior to Admission  Medication Sig Dispense Refill  . albuterol-ipratropium (COMBIVENT) 18-103 MCG/ACT inhaler Inhale 2 puffs into the lungs 2 (two) times daily.       Marland Kitchen atorvastatin (LIPITOR) 20 MG tablet Take 20 mg by mouth daily with breakfast.       . fluticasone (FLONASE) 50 MCG/ACT nasal spray Place 2 sprays into the nose 2 (two) times daily as needed. For dryness.      . Fluticasone-Salmeterol (ADVAIR DISKUS) 500-50 MCG/DOSE AEPB Inhale 1 puff into the lungs every 12 (twelve) hours.       . furosemide (LASIX) 40 MG tablet Take 1 tablet (40 mg total) by mouth daily with breakfast.  30 tablet    . gabapentin (NEURONTIN) 300 MG capsule Take 600 mg by mouth 3 (three) times daily.       Marland Kitchen guaiFENesin (MUCINEX) 600 MG 12 hr tablet Take 1,200 mg by mouth 2 (two) times daily.      Marland Kitchen loratadine (CLARITIN) 10 MG tablet Take 10 mg by mouth daily with breakfast.       . Magnesium Chloride (SLOW-MAG) 535 (64 MG) MG TBCR Take 1 tablet by mouth 2 (two) times daily.       . metoprolol tartrate (LOPRESSOR) 25 MG tablet Take 25 mg by mouth 2 (two) times daily.      . Omega-3 Fatty Acids (FISH OIL) 1000 MG CAPS Take 1 capsule by mouth daily.       . potassium chloride (K-DUR) 10 MEQ tablet Take 10 mEq by mouth 2 (two) times daily.       . predniSONE (DELTASONE) 5 MG tablet Take 5 mg by mouth daily.      . ranitidine  (ZANTAC) 150 MG capsule Take 150 mg by mouth 2 (two) times daily.       Marland Kitchen tiotropium (SPIRIVA) 18 MCG inhalation capsule Place 18 mcg into inhaler and inhale daily with breakfast.       . Vitamin D, Ergocalciferol, (DRISDOL) 50000 UNITS CAPS Take 50,000 Units by mouth every 7 (seven) days. Take on Fridays      . vitamin E 1000 UNIT capsule Take 1,000 Units by mouth daily with breakfast.       . levofloxacin (LEVAQUIN) 750 MG tablet Take 1 tablet (750 mg total) by mouth every other day. Start on 09/18/2012.  2 tablet  0    Home: Home Living Lives With: Son Available Help at Discharge: Family Type of Home: House Home Access:  Stairs to enter [employer] of Steps: 1 Home Layout: One level Bathroom Shower/Tub: Health visitor: Standard Home Adaptive Equipment: Walker - rolling;Straight cane;Bedside commode/3-in-1;Grab bars in shower   Functional History: Prior Function Able to Take Stairs?: Yes Driving: Yes  Functional Status:  Mobility: Bed Mobility Bed Mobility: Not assessed Rolling Right: 3: Mod assist Right Sidelying to Sit: 3: Mod assist Supine to Sit: 4: Min assist;HOB elevated Transfers Transfers: Sit to Stand;Stand to Sit Sit to Stand: 3: Mod assist;With upper extremity assist;From toilet;From chair/3-in-1 Sit to Stand: Patient Percentage: 60% Stand to Sit: 3: Mod assist;To chair/3-in-1;To toilet Stand to Sit: Patient Percentage: 50% Stand Pivot Transfers: 1: +2 Total assist;From elevated surface Stand Pivot Transfers: Patient Percentage: 60% Ambulation/Gait Ambulation/Gait Assistance: 3: Mod assist Ambulation/Gait: Patient Percentage: 60% Ambulation Distance (Feet): 40 Feet Assistive device: 1 person hand held assist Ambulation/Gait Assistance Details: side-by-side assist with guard arm around waist for balance control especially with turning; verbal cues to stand tall, look forward, increase RIGHT step length Gait Pattern: Step-through  pattern Gait velocity: decreased General Gait Details: short steps, minimal step-through with cues, impulsive turning Wheelchair Mobility Wheelchair Mobility: No  ADL: ADL Eating/Feeding: Performed;Set up Where Assessed - Eating/Feeding: Chair Grooming: Wash/dry hands;Minimal assistance;Brushing hair Where Assessed - Grooming: Supported standing Upper Body Bathing: Maximal assistance Where Assessed - Upper Body Bathing: Unsupported sitting Lower Body Bathing: +1 Total assistance Where Assessed - Lower Body Bathing: Unsupported sitting;Supported sit to stand Upper Body Dressing: Minimal assistance Where Assessed - Upper Body Dressing: Unsupported sitting Lower Body Dressing: +1 Total assistance Where Assessed - Lower Body Dressing: Unsupported sitting;Supported sit to stand Toilet Transfer: Moderate assistance Toilet Transfer Method: Sit to stand Toilet Transfer Equipment: Comfort height toilet;Grab bars Equipment Used: Gait belt Transfers/Ambulation Related to ADLs: Mod assist, pt's R arm around OTs waist ADL Comments: Worked on hemitechniques for dressing.  Good incorporation of L UE in attempt to use grab bar, in hand washing, and in manipulating news paper.  Cognition: Cognition Overall Cognitive Status: Impaired/Different from baseline Arousal/Alertness: Awake/alert Orientation Level: Oriented to place;Oriented to situation;Oriented to person;Disoriented to time Cognition Arousal/Alertness: Awake/alert Behavior During Therapy: Impulsive Overall Cognitive Status: Impaired/Different from baseline Area of Impairment: Safety/judgement;Awareness;Memory Orientation Level: Disoriented to;Place (slow response to place) Memory: Decreased short-term memory Safety/Judgement: Decreased awareness of deficits Awareness: Intellectual Problem Solving: Requires verbal cues General Comments: Needs verbal cues to protect L UE from harm.    Physical Exam: Blood pressure 132/82, pulse 77,  temperature 97.4 F (36.3 C), temperature source Oral, resp. rate 18, height 6\' 2"  (1.88 m), weight 106.8 kg (235 lb 7.2 oz), SpO2 97.00%.  Nursing note and vitals reviewed.  Constitutional: He is oriented to person, place, and time. He appears well-developed and well-nourished.  HENT:  Head: Normocephalic and atraumatic.  Eyes: Pupils are equal, round, and reactive to light.  Neck: Normal range of motion. Neck supple.  Cardiovascular: Normal rate and regular rhythm.  Pulmonary/Chest: Effort normal. No respiratory distress. He has no wheezes.  Abdominal: He exhibits distension.  Round, decreased BS.  Musculoskeletal: He exhibits no edema.  Neurological: He is alert and oriented to person,hospital,. Needed cues for date, day of week. Tends to laugh through deficits but acknowledges them.  Mild dysarthria. Follows commands without difficulty. LUE 3- delt, 3 bicep,tricep, WE,HI are 3 to 3+/5. Marland Kitchen LLE grossly 3/5 HF, 3+ to 4- KE, 4/5 ADF.   Decreased sensation bilateral feet in stocking glove distribution but otherwise non-focal. Right side grossly 4 to  5/5.  Skin: Skin is warm and dry. Rash (Right shin/foot.) noted. Chronic vascular changes Psychiatric:  Less Restless but distractible at times  Results for orders placed during the hospital encounter of 10/01/12 (from the past 48 hour(s))  PROTIME-INR     Status: None   Collection Time    10/06/12  2:18 PM      Result Value Range   Prothrombin Time 14.1  11.6 - 15.2 seconds   INR 1.10  0.00 - 1.49  BASIC METABOLIC PANEL     Status: Abnormal   Collection Time    10/07/12  5:30 AM      Result Value Range   Sodium 138  135 - 145 mEq/L   Potassium 2.9 (*) 3.5 - 5.1 mEq/L   Chloride 103  96 - 112 mEq/L   CO2 26  19 - 32 mEq/L   Glucose, Bld 103 (*) 70 - 99 mg/dL   BUN 27 (*) 6 - 23 mg/dL   Creatinine, Ser 4.09  0.50 - 1.35 mg/dL   Calcium 8.7  8.4 - 81.1 mg/dL   GFR calc non Af Amer 50 (*) >90 mL/min   GFR calc Af Amer 57 (*) >90 mL/min    Comment:            The eGFR has been calculated     using the CKD EPI equation.     This calculation has not been     validated in all clinical     situations.     eGFR's persistently     <90 mL/min signify     possible Chronic Kidney Disease.  PROTIME-INR     Status: None   Collection Time    10/07/12  5:30 AM      Result Value Range   Prothrombin Time 14.2  11.6 - 15.2 seconds   INR 1.11  0.00 - 1.49  PROTIME-INR     Status: Abnormal   Collection Time    10/08/12  5:52 AM      Result Value Range   Prothrombin Time 17.6 (*) 11.6 - 15.2 seconds   INR 1.49  0.00 - 1.49  BASIC METABOLIC PANEL     Status: Abnormal   Collection Time    10/08/12  5:52 AM      Result Value Range   Sodium 137  135 - 145 mEq/L   Potassium 3.7  3.5 - 5.1 mEq/L   Comment: DELTA CHECK NOTED   Chloride 106  96 - 112 mEq/L   CO2 25  19 - 32 mEq/L   Glucose, Bld 122 (*) 70 - 99 mg/dL   BUN 22  6 - 23 mg/dL   Creatinine, Ser 9.14  0.50 - 1.35 mg/dL   Calcium 8.6  8.4 - 78.2 mg/dL   GFR calc non Af Amer 52 (*) >90 mL/min   GFR calc Af Amer 60 (*) >90 mL/min   Comment:            The eGFR has been calculated     using the CKD EPI equation.     This calculation has not been     validated in all clinical     situations.     eGFR's persistently     <90 mL/min signify     possible Chronic Kidney Disease.   No results found.  Post Admission Physician Evaluation: Functional deficits secondary to right subcortical infarct with LUE >LLE weakness, deconditioning related to multiple medical issues  .  1. Patient is admitted to receive collaborative, interdisciplinary care between the physiatrist, rehab nursing staff, and therapy team. 2. Patient's level of medical complexity and substantial therapy needs in context of that medical necessity cannot be provided at a lesser intensity of care such as a SNF. 3. Patient has experienced substantial functional loss from his/her baseline which was documented above  under the "Functional History" and "Functional Status" headings.  Judging by the patient's diagnosis, physical exam, and functional history, the patient has potential for functional progress which will result in measurable gains while on inpatient rehab.  These gains will be of substantial and practical use upon discharge  in facilitating mobility and self-care at the household level. 4. Physiatrist will provide 24 hour management of medical needs as well as oversight of the therapy plan/treatment and provide guidance as appropriate regarding the interaction of the two. 5. 24 hour rehab nursing will assist with bladder management, bowel management, safety, skin/wound care, disease management, medication administration, pain management and patient education  and help integrate therapy concepts, techniques,education, etc. 6. PT will assess and treat for/with: Lower extremity strength, range of motion, stamina, balance, functional mobility, safety, adaptive techniques and equipment, NMR, cognitive perceptual rx, family ed.   Goals are: supervision to min assist. 7. OT will assess and treat for/with: ADL's, functional mobility, safety, upper extremity strength, adaptive techniques and equipment, NMR, cognitive perceptual rx, family ed.   Goals are: min assist to supervision. 8. SLP will assess and treat for/with: cognition, communication.  Goals are: supervision. 9. Case Management and Social Worker will assess and treat for psychological issues and discharge planning. 10. Team conference will be held weekly to assess progress toward goals and to determine barriers to discharge. 11. Patient will receive at least 3 hours of therapy per day at least 5 days per week. 12. ELOS: 2.5 weeks      Prognosis:  good   Medical Problem List and Plan: 1. DVT Prophylaxis/Anticoagulation: Pharmaceutical: Other (comment) 2. Pain Management: tylenol, gabapentin for neuropathic pain 3. Mood: team to provide egosupport. Pt  shows no signs of depression on examination, however cognition and attention don't allow for a reliable assessment. 4. Neuropsych: This patient is not capable of making decisions on his own behalf. 5. Atrial Fibrillation: Monitor HR with bid checks. Continues with tachycardia with activity--likely due to deconditioned. Will continue  6. Acute on chronic renal failure: Lasix discontinued. May need to stay dry to avoid recurrent exacerbation of CHF. Low salt diet. Check daily weights.  7. Acute on chroniccombined systolic and diastolic CHF:  8. Recent PNA with question of Aspiration PNA: completed 7/7 days antibiotic.  9. COPD: Respiratory status improving. Continue combivent bid with spiriva and low dose oral steroids.  10.            Ranelle Oyster, MD, [facility] Physical Medicine & Rehabilitation   10/08/2012

## 2012-10-08 NOTE — Progress Notes (Signed)
Admitting patient to CIR today. Dr Ardyth Harps reports pt is medically stable for d/c to CIR. Pt is in agreement with plan. For questions, call [phone]

## 2012-10-08 NOTE — Progress Notes (Signed)
CIR Admitting:  Noted Dr Waymon Amato progress note yesterday evening that pt likely to be ready for d/c to CIR in next 24-48 hrs. Await MD order for d/c. Will  continue to follow patient's progress to evaluate for CIR. For questions, call [phone]

## 2012-10-08 NOTE — Progress Notes (Signed)
Pt arrived rom 4700 via recliner, appears alert to person,place,event.

## 2012-10-08 NOTE — Progress Notes (Signed)
Doing well adv diet will sign off

## 2012-10-08 NOTE — Progress Notes (Signed)
Pt discharged. Wheeled over to inpatient rehab to [phone] with all belongings. Accompanied by RN and son.

## 2012-10-08 NOTE — H&P (Signed)
Physical Medicine and Rehabilitation Admission H&P  Chief Complaint   Patient presents with   .  Weakness   :  HPI: Richard Chavez is a 79 y.o. male with history of GERD, COPD, CAD, advanced systolic and diastolic heart failure, recent admission for PNA who was readmitted on 10/01/12 with SOB and BLE edema. He was noted to have LUE weakness with numbness and family reported confusion as well as difficulty walking. He was treated with IV diuretics for acute on chronic CHF with improvement in respiratory status and LE edema. CT head done per neurology input due to concerns of subcortical stroke. CT head without acute changes and stable mild atrophy with white matter ischemic changes. Carotid dopplers with right 40-59% ICA stenosis and L-60-79% ICA stenosis. He was changed to plavix per neurology recommendations. He reported problems with constipation and was noted to have SBO. Dr. Derrell Lolling consulted and recommended NGT to low suction. Atrial fibrillation with RVR treated with IV metoprolol and cardiology recommended changing patient to eliquis. Treated with IV vanc/ zosyn for potential aspiration PNA. GI symptoms improving and diet slowly initiated to D3. Patient continues with left sided weakness, distractibility needing redirection as well as intermittent confusion. He needs full supervision to follow aspiration precautions with oral difficulties--may need diet downgrade. Therapy team recommending CIR and patient admitted today.  Review of Systems  Constitutional: Negative for fever and chills.  HENT: Negative for hearing loss, neck pain and tinnitus.  Eyes: Negative for blurred vision and double vision.  Respiratory: Positive for cough.  Gastrointestinal: Negative for heartburn, nausea and vomiting.  Musculoskeletal: Negative for myalgias.  Skin: Negative for itching and rash.  Neurological: Positive for tremors and focal weakness. Negative for headaches.  Psychiatric/Behavioral: Positive for memory  loss. Negative for depression and suicidal ideas.   Past Medical History   Diagnosis  Date   .  CAD (coronary artery disease), native coronary artery      Status post CABG   .  Hypertension    .  Hyperlipidemia    .  GERD (gastroesophageal reflux disease)    .  COPD (chronic obstructive pulmonary disease)    .  CRF (chronic renal failure)    .  Barrett's esophagus    .  OP (osteoporosis)    .  CAD (coronary artery disease) of artery bypass graft  07/04/2011     LIMA-LAD; SVG-OM2-OM3, SVG-RCA -- all patent as of 05/27/11   .  Syncope  07/04/2011   .  Pacemaker  07/04/2011   .  Cardiomyopathy, ischemic  07/07/2011     2D Echo - EF 35-40, moderately dilated left atrium   .  Myocardial infarction      25 yrs ago   .  Peripheral vascular disease    .  Arthritis    .  BPH (benign prostatic hyperplasia)    .  Polyp of colon      removed   .  Sleep apnea      STOP BANG SCORE 4   .  AV block, 3rd degree      Post [employer] Jude pacemaker, EF 35-40, does have intermittent atrial tachycardia, either PAt or Afib   .  BPH (benign prostatic hypertrophy)  11/15/2011   .  SOB (shortness of breath) on exertion  02/14/2010     2D Echo - EF 35-45,    Past Surgical History   Procedure  Laterality  Date   .  Pacemaker placement   2007   .  Cholecystectomy   2005   .  Coronary artery bypass graft   1978   .  Lesion excision       from penis   .  Cardiac catheterization   06/06/2011     occluded proximal RCA, ostial LAD and mid Circumflex after OM 1.; [place] LIMA-LAD, patent SVG-OM1-OM 2, patent SVG-RCA. Reduced ejection fraction of 30-35%.   .  Cardiac catheterization   06/14/2010     LIMA to LAD, SVG to RCA, SVG to OM1 Circumflex, 100% occluded RCA, 100% occluded circumfkex, 100% occluded LAD, no evidence of graft dysfunction, continue medical therapy   .  Cardiac catheterization   04/12/2003     3 vessel coronary artery disease, continue medical treatment   .  Cardiac catheterization   10/07/2000     Severe  3 vessel coronary artery disease, continue medical treatment    Family History   Problem  Relation  Age of Onset   .  Leukemia  Father    .  Heart disease  Father    .  Alzheimer's disease  Mother    .  Arthritis  Mother    .  Lung cancer  Son  70   .  Alzheimer's disease  Maternal Grandmother     Social History: Lives with son. Son provides supervision/ assistance as needed. He retired from [employer]" reports that he quit smoking about 27 years ago. His smoking use included Cigarettes. He has a 4.5 pack-year smoking history. He has never used smokeless tobacco. He reports that he does not drink alcohol or use illicit drugs.    Allergies: No Known Allergies  Medications Prior to Admission   Medication  Sig  Dispense  Refill   .  albuterol-ipratropium (COMBIVENT) 18-103 MCG/ACT inhaler  Inhale 2 puffs into the lungs 2 (two) times daily.     Marland Kitchen  atorvastatin (LIPITOR) 20 MG tablet  Take 20 mg by mouth daily with breakfast.     .  fluticasone (FLONASE) 50 MCG/ACT nasal spray  Place 2 sprays into the nose 2 (two) times daily as needed. For dryness.     .  Fluticasone-Salmeterol (ADVAIR DISKUS) 500-50 MCG/DOSE AEPB  Inhale 1 puff into the lungs every 12 (twelve) hours.     .  furosemide (LASIX) 40 MG tablet  Take 1 tablet (40 mg total) by mouth daily with breakfast.  30 tablet    .  gabapentin (NEURONTIN) 300 MG capsule  Take 600 mg by mouth 3 (three) times daily.     Marland Kitchen  guaiFENesin (MUCINEX) 600 MG 12 hr tablet  Take 1,200 mg by mouth 2 (two) times daily.     Marland Kitchen  loratadine (CLARITIN) 10 MG tablet  Take 10 mg by mouth daily with breakfast.     .  Magnesium Chloride (SLOW-MAG) 535 (64 MG) MG TBCR  Take 1 tablet by mouth 2 (two) times daily.     .  metoprolol tartrate (LOPRESSOR) 25 MG tablet  Take 25 mg by mouth 2 (two) times daily.     .  Omega-3 Fatty Acids (FISH OIL) 1000 MG CAPS  Take 1 capsule by mouth daily.     .  potassium chloride (K-DUR) 10 MEQ tablet  Take 10 mEq by  mouth 2 (two) times daily.     .  predniSONE (DELTASONE) 5 MG tablet  Take 5 mg by mouth daily.     .  ranitidine (ZANTAC) 150 MG capsule  Take 150 mg by mouth  2 (two) times daily.     Marland Kitchen  tiotropium (SPIRIVA) 18 MCG inhalation capsule  Place 18 mcg into inhaler and inhale daily with breakfast.     .  Vitamin D, Ergocalciferol, (DRISDOL) 50000 UNITS CAPS  Take 50,000 Units by mouth every 7 (seven) days. Take on Fridays     .  vitamin E 1000 UNIT capsule  Take 1,000 Units by mouth daily with breakfast.     .  levofloxacin (LEVAQUIN) 750 MG tablet  Take 1 tablet (750 mg total) by mouth every other day. Start on 09/18/2012.  2 tablet  0    Home:  Home Living  Lives With: Son  Available Help at Discharge: Family  Type of Home: House  Home Access: Stairs to enter  Secretary/administrator of Steps: 1  Home Layout: One level  Bathroom Shower/Tub: Pension scheme manager: Standard  Home Adaptive Equipment: Environmental consultant - rolling;Straight cane;Bedside commode/3-in-1;Grab bars in shower  Functional History:  Prior Function  Able to Take Stairs?: Yes  Driving: Yes  Functional Status:  Mobility:  Bed Mobility  Bed Mobility: Not assessed  Rolling Right: 3: Mod assist  Right Sidelying to Sit: 3: Mod assist  Supine to Sit: 4: Min assist;HOB elevated  Transfers  Transfers: Sit to Stand;Stand to Sit  Sit to Stand: 3: Mod assist;With upper extremity assist;From toilet;From chair/3-in-1  Sit to Stand: Patient Percentage: 60%  Stand to Sit: 3: Mod assist;To chair/3-in-1;To toilet  Stand to Sit: Patient Percentage: 50%  Stand Pivot Transfers: 1: +2 Total assist;From elevated surface  Stand Pivot Transfers: Patient Percentage: 60%  Ambulation/Gait  Ambulation/Gait Assistance: 3: Mod assist  Ambulation/Gait: Patient Percentage: 60%  Ambulation Distance (Feet): 40 Feet  Assistive device: 1 person hand held assist  Ambulation/Gait Assistance Details: side-by-side assist with guard arm around waist  for balance control especially with turning; verbal cues to stand tall, look forward, increase RIGHT step length  Gait Pattern: Step-through pattern  Gait velocity: decreased  General Gait Details: short steps, minimal step-through with cues, impulsive turning  Wheelchair Mobility  Wheelchair Mobility: No  ADL:  ADL  Eating/Feeding: Performed;Set up  Where Assessed - Eating/Feeding: Chair  Grooming: Wash/dry hands;Minimal assistance;Brushing hair  Where Assessed - Grooming: Supported standing  Upper Body Bathing: Maximal assistance  Where Assessed - Upper Body Bathing: Unsupported sitting  Lower Body Bathing: +1 Total assistance  Where Assessed - Lower Body Bathing: Unsupported sitting;Supported sit to stand  Upper Body Dressing: Minimal assistance  Where Assessed - Upper Body Dressing: Unsupported sitting  Lower Body Dressing: +1 Total assistance  Where Assessed - Lower Body Dressing: Unsupported sitting;Supported sit to stand  Toilet Transfer: Moderate assistance  Toilet Transfer Method: Sit to stand  Toilet Transfer Equipment: Comfort height toilet;Grab bars  Equipment Used: Gait belt  Transfers/Ambulation Related to ADLs: Mod assist, pt's R arm around OTs waist  ADL Comments: Worked on hemitechniques for dressing. Good incorporation of L UE in attempt to use grab bar, in hand washing, and in manipulating news paper.  Cognition:  Cognition  Overall Cognitive Status: Impaired/Different from baseline  Arousal/Alertness: Awake/alert  Orientation Level: Oriented to place;Oriented to situation;Oriented to person;Disoriented to time  Cognition  Arousal/Alertness: Awake/alert  Behavior During Therapy: Impulsive  Overall Cognitive Status: Impaired/Different from baseline  Area of Impairment: Safety/judgement;Awareness;Memory  Orientation Level: Disoriented to;Place (slow response to place)  Memory: Decreased short-term memory  Safety/Judgement: Decreased awareness of deficits   Awareness: Intellectual  Problem Solving: Requires verbal cues  General Comments:  Needs verbal cues to protect L UE from harm.  Physical Exam:  Blood pressure 132/82, pulse 77, temperature 97.4 F (36.3 C), temperature source Oral, resp. rate 18, height 6\' 2"  (1.88 m), weight 106.8 kg (235 lb 7.2 oz), SpO2 97.00%.  Nursing note and vitals reviewed.  Constitutional: He is oriented to person, place, and time. He appears well-developed and well-nourished.  HENT:  Head: Normocephalic and atraumatic.  Eyes: Pupils are equal, round, and reactive to light.  Neck: Normal range of motion. Neck supple.  Cardiovascular: Normal rate and regular rhythm.  Pulmonary/Chest: Effort normal. No respiratory distress. He has no wheezes.  Abdominal: He exhibits distension.  Round, decreased BS.  Musculoskeletal: He exhibits no edema.  Neurological: He is alert and oriented to person,hospital,. Needed cues for date, day of week. Tends to laugh through deficits but acknowledges them.  Mild dysarthria. Follows commands without difficulty. LUE 3- delt, 3 bicep,tricep, WE,HI are 3 to 3+/5. Marland Kitchen LLE grossly 3/5 HF, 3+ to 4- KE, 4/5 ADF. Decreased sensation bilateral feet in stocking glove distribution but otherwise non-focal. Right side grossly 4 to 5/5.  Skin: Skin is warm and dry. Rash (Right shin/foot.) noted. Chronic vascular changes Psychiatric:  Less Restless but distractible at times  Results for orders placed during the hospital encounter of 10/01/12 (from the past 48 hour(s))   PROTIME-INR Status: None    Collection Time    10/06/12 2:18 PM   Result  Value  Range    Prothrombin Time  14.1  11.6 - 15.2 seconds    INR  1.10  0.00 - 1.49   BASIC METABOLIC PANEL Status: Abnormal    Collection Time    10/07/12 5:30 AM   Result  Value  Range    Sodium  138  135 - 145 mEq/L    Potassium  2.9 (*)  3.5 - 5.1 mEq/L    Chloride  103  96 - 112 mEq/L    CO2  26  19 - 32 mEq/L    Glucose, Bld  103 (*)  70 - 99  mg/dL    BUN  27 (*)  6 - 23 mg/dL    Creatinine, Ser  1.61  0.50 - 1.35 mg/dL    Calcium  8.7  8.4 - 10.5 mg/dL    GFR calc non Af Amer  50 (*)  >90 mL/min    GFR calc Af Amer  57 (*)  >90 mL/min    Comment:      The eGFR has been calculated     using the CKD EPI equation.     This calculation has not been     validated in all clinical     situations.     eGFR's persistently     <90 mL/min signify     possible Chronic Kidney Disease.   PROTIME-INR Status: None    Collection Time    10/07/12 5:30 AM   Result  Value  Range    Prothrombin Time  14.2  11.6 - 15.2 seconds    INR  1.11  0.00 - 1.49   PROTIME-INR Status: Abnormal    Collection Time    10/08/12 5:52 AM   Result  Value  Range    Prothrombin Time  17.6 (*)  11.6 - 15.2 seconds    INR  1.49  0.00 - 1.49   BASIC METABOLIC PANEL Status: Abnormal    Collection Time    10/08/12 5:52 AM   Result  Value  Range    Sodium  137  135 - 145 mEq/L    Potassium  3.7  3.5 - 5.1 mEq/L    Comment:  DELTA CHECK NOTED    Chloride  106  96 - 112 mEq/L    CO2  25  19 - 32 mEq/L    Glucose, Bld  122 (*)  70 - 99 mg/dL    BUN  22  6 - 23 mg/dL    Creatinine, Ser  9.60  0.50 - 1.35 mg/dL    Calcium  8.6  8.4 - 10.5 mg/dL    GFR calc non Af Amer  52 (*)  >90 mL/min    GFR calc Af Amer  60 (*)  >90 mL/min    Comment:      The eGFR has been calculated     using the CKD EPI equation.     This calculation has not been     validated in all clinical     situations.     eGFR's persistently     <90 mL/min signify     possible Chronic Kidney Disease.    No results found.  Post Admission Physician Evaluation:  Functional deficits secondary to right subcortical infarct with LUE >LLE weakness, deconditioning related to multiple medical issues .  1. Patient is admitted to receive collaborative, interdisciplinary care between the physiatrist, rehab nursing staff, and therapy team. 2. Patient's level of medical complexity and substantial  therapy needs in context of that medical necessity cannot be provided at a lesser intensity of care such as a SNF. 3. Patient has experienced substantial functional loss from his/her baseline which was documented above under the "Functional History" and "Functional Status" headings. Judging by the patient's diagnosis, physical exam, and functional history, the patient has potential for functional progress which will result in measurable gains while on inpatient rehab. These gains will be of substantial and practical use upon discharge in facilitating mobility and self-care at the household level. 4. Physiatrist will provide 24 hour management of medical needs as well as oversight of the therapy plan/treatment and provide guidance as appropriate regarding the interaction of the two. 5. 24 hour rehab nursing will assist with bladder management, bowel management, safety, skin/wound care, disease management, medication administration, pain management and patient education and help integrate therapy concepts, techniques,education, etc. 6. PT will assess and treat for/with: Lower extremity strength, range of motion, stamina, balance, functional mobility, safety, adaptive techniques and equipment, NMR, cognitive perceptual rx, family ed. Goals are: supervision to min assist. 7. OT will assess and treat for/with: ADL's, functional mobility, safety, upper extremity strength, adaptive techniques and equipment, NMR, cognitive perceptual rx, family ed. Goals are: min assist to supervision. 8. SLP will assess and treat for/with: cognition, communication. Goals are: supervision. 9. Case Management and Social Worker will assess and treat for psychological issues and discharge planning. 10. Team conference will be held weekly to assess progress toward goals and to determine barriers to discharge. 11. Patient will receive at least 3 hours of therapy per day at least 5 days per week. 12. ELOS: 2.5 weeks Prognosis:  good Medical Problem List and Plan:  1. DVT Prophylaxis/Anticoagulation: Pharmaceutical: Other (comment)  2. Pain Management: tylenol, gabapentin for neuropathic pain  3. Mood: team to provide egosupport. Pt shows no signs of depression on examination, however cognition and attention don't allow for a reliable assessment.  4. Neuropsych: This patient is not capable of making decisions on his own behalf.  5. Atrial  Fibrillation: Monitor HR with bid checks. Continues with tachycardia with activity--likely due to deconditioned. Will continue  6. Acute on chronic renal failure: Lasix discontinued. May need to stay dry to avoid recurrent exacerbation of CHF. Low salt diet. Check daily weights.  7. Acute on chroniccombined systolic and diastolic CHF:  8. Recent PNA with question of Aspiration PNA: completed 7/7 days antibiotic.  9. COPD: Respiratory status improving. Continue combivent bid with spiriva and low dose oral steroids.   Ranelle Oyster, MD, [facility]  [facility] Physical Medicine & Rehabilitation  10/08/2012

## 2012-10-08 NOTE — Clinical Documentation Improvement (Signed)
DOCUMENTATION CLARIFICATION   THIS DOCUMENT IS NOT A PERMANENT PART OF THE MEDICAL RECORD  TO RESPOND TO THE THIS QUERY, FOLLOW THE INSTRUCTIONS BELOW:  1. If needed, update documentation for the patient's encounter via the notes activity.  2. Access this query again and click edit on the In [employer].  3. After updating, or not, click F2 to complete all highlighted (required) fields concerning your review. Select "additional documentation in the medical record" OR "no additional documentation provided".  4. Click Sign note button.  5. The deficiency will fall out of your In Basket *Please let us know if you are not able to complete this workflow by phone or e-mail (listed below).         10/08/12  To Elease Etienne, MD [facility]  In an effort to better capture your patient's severity of illness, reflect appropriate length of stay and utilization of resources, a review of the patient medical record has revealed the following indicators.    Based on your clinical judgment, please clarify and document in a progress note and/or discharge summary the clinical condition associated with the following supporting information:  In responding to this query please exercise your independent judgment.  The fact that a query is asked, does not imply that any particular answer is desired or expected.   "Confusion: Possibly from acute illness-improving" being documented, would either of the following diagnoses be more  appropriate? Thank you  Possible Clinical Conditions?   Encephalopathy   Metabolic Encephalopathy        Acute confusion  Acute delirium  Other Condition  Cannot Clinically Determine    Treatment: management of acute illness    Reviewed: Acute Confusion: per note by Dr. Marcellus Scott 10/08/2012   Thank You,  Lavonda Jumbo  Clinical Documentation Specialist: Pager [phone]  Health Information Management [place]

## 2012-10-09 ENCOUNTER — Inpatient Hospital Stay (HOSPITAL_COMMUNITY): Payer: Medicare Other | Admitting: Occupational Therapy

## 2012-10-09 ENCOUNTER — Inpatient Hospital Stay (HOSPITAL_COMMUNITY): Payer: Medicare Other | Admitting: *Deleted

## 2012-10-09 ENCOUNTER — Inpatient Hospital Stay (HOSPITAL_COMMUNITY): Payer: Medicare Other

## 2012-10-09 ENCOUNTER — Inpatient Hospital Stay (HOSPITAL_COMMUNITY): Payer: Medicare Other | Admitting: Speech Pathology

## 2012-10-09 DIAGNOSIS — G8110 Spastic hemiplegia affecting unspecified side: Secondary | ICD-10-CM

## 2012-10-09 DIAGNOSIS — I6340 Cerebral infarction due to embolism of unspecified cerebral artery: Secondary | ICD-10-CM

## 2012-10-09 DIAGNOSIS — I639 Cerebral infarction, unspecified: Secondary | ICD-10-CM

## 2012-10-09 HISTORY — DX: Cerebral infarction, unspecified: I63.9

## 2012-10-09 LAB — CBC WITH DIFFERENTIAL/PLATELET
Eosinophils Relative: 4 % (ref 0–5)
HCT: 37.4 % — ABNORMAL LOW (ref 39.0–52.0)
Hemoglobin: 12.4 g/dL — ABNORMAL LOW (ref 13.0–17.0)
Lymphocytes Relative: 33 % (ref 12–46)
Lymphs Abs: 2 10*3/uL (ref 0.7–4.0)
MCV: 85.6 fL (ref 78.0–100.0)
Monocytes Absolute: 0.5 10*3/uL (ref 0.1–1.0)
Monocytes Relative: 9 % (ref 3–12)
RBC: 4.37 MIL/uL (ref 4.22–5.81)
WBC: 5.9 10*3/uL (ref 4.0–10.5)

## 2012-10-09 LAB — COMPREHENSIVE METABOLIC PANEL
ALT: 15 U/L (ref 0–53)
CO2: 23 mEq/L (ref 19–32)
Calcium: 8.4 mg/dL (ref 8.4–10.5)
Chloride: 106 mEq/L (ref 96–112)
Creatinine, Ser: 1.12 mg/dL (ref 0.50–1.35)
GFR calc Af Amer: 68 mL/min — ABNORMAL LOW (ref >90)
GFR calc non Af Amer: 59 mL/min — ABNORMAL LOW (ref >90)
Glucose, Bld: 97 mg/dL (ref 70–99)
Total Bilirubin: 0.5 mg/dL (ref 0.3–1.2)

## 2012-10-09 MED ORDER — PREDNISONE 5 MG PO TABS
5.0000 mg | ORAL_TABLET | Freq: Every day | ORAL | Status: DC
  Administered 2012-10-11 – 2012-10-17 (×7): 5 mg via ORAL
  Filled 2012-10-09 (×8): qty 1

## 2012-10-09 MED ORDER — POTASSIUM CHLORIDE CRYS ER 20 MEQ PO TBCR
20.0000 meq | EXTENDED_RELEASE_TABLET | Freq: Two times a day (BID) | ORAL | Status: DC
  Administered 2012-10-09 – 2012-10-17 (×17): 20 meq via ORAL
  Filled 2012-10-09 (×19): qty 1

## 2012-10-09 MED ORDER — PREDNISONE 10 MG PO TABS
10.0000 mg | ORAL_TABLET | Freq: Every day | ORAL | Status: AC
  Administered 2012-10-09 – 2012-10-10 (×2): 10 mg via ORAL
  Filled 2012-10-09 (×2): qty 1

## 2012-10-09 NOTE — Plan of Care (Signed)
Problem: RH PAIN MANAGEMENT Goal: RH STG PAIN MANAGED AT OR BELOW [name] PAIN GOAL Pt stated pain goal is 2-3/10  Outcome: Progressing No c/o pain

## 2012-10-09 NOTE — Progress Notes (Signed)
Physical Therapy Assessment and Plan  Patient Details  Name: Richard Chavez MRN: [id number] Date of Birth: 08/25/1926  PT Diagnosis: Difficulty walking, ataxia, Hemiparesis dominant UE, Impaired cognition, Impaired sensation and Muscle weakness Rehab Potential: Good ELOS: 2 weeks   Today's Date: 10/09/2012 Time: 9:08-10:08 ( )   Problem List:  Patient Active Problem List   Diagnosis Date Noted  . CVA (cerebral infarction) 10/09/2012  . Atrial fibrillation with RVR 10/03/2012  . Small bowel obstruction 10/03/2012  . Renal failure, acute on chronic 10/03/2012  . Hyperkalemia 10/03/2012  . Stroke, lacunar 10/01/2012  . Acute on chronic combined systolic and diastolic congestive heart failure, NYHA class 3 10/01/2012  . Possible reccuring Aspiration pneumonia, worsened by potential CVA 10/01/2012  . Chronic kidney disease, stage III (moderate) 09/16/2012  . Chronic combined systolic and diastolic congestive heart failure 09/15/2012  . CKD (chronic kidney disease) stage 2, GFR 60-89 ml/min 09/15/2012  . OSA (obstructive sleep apnea) 09/15/2012  . PNA (pneumonia) 05/16/2012  . Hypotension 05/16/2012  . Leukocytosis 05/16/2012  . Renal failure (ARF), acute on chronic 05/16/2012  . Dehydration 05/16/2012  . UTI (lower urinary tract infection) 07/04/2011  . CAD CABG 1998, patent grafts 06/06/11 after abn Myoview 07/04/2011  . Syncope, admitted with dehydration, hypotension, acute renalinsufficency. 07/04/2011  . Pacemaker, St Jude, implanted 2008 07/04/2011  . Cardiomyopathy, ischemic, EF 30-35% by recent cath 07/04/2011  . Edema of both legs 07/04/2011  . Hypertension 04/26/2011  . Heart attack 04/26/2011  . NSVT  04/26/2011  . Hypercholesteremia 04/26/2011  . Allergic rhinitis 04/26/2011  . COPD (chronic obstructive pulmonary disease) 04/26/2011  . GERD (gastroesophageal reflux disease) 04/26/2011  . Barrett's esophagus 04/26/2011    Past Medical History:  Past Medical  History  Diagnosis Date  . CAD (coronary artery disease), native coronary artery     Status post CABG  . Hypertension   . Hyperlipidemia   . GERD (gastroesophageal reflux disease)   . COPD (chronic obstructive pulmonary disease)   . CRF (chronic renal failure)   . Barrett's esophagus   . OP (osteoporosis)   . CAD (coronary artery disease) of artery bypass graft 07/04/2011    LIMA-LAD; SVG-OM2-OM3, SVG-RCA -- all patent as of 05/27/11  . Syncope 07/04/2011  . Pacemaker 07/04/2011  . Cardiomyopathy, ischemic 07/07/2011    2D Echo - EF 35-40, moderately dilated left atrium  . Myocardial infarction     25 yrs ago  . Peripheral vascular disease   . Arthritis   . BPH (benign prostatic hyperplasia)   . Polyp of colon     removed  . Sleep apnea     STOP BANG SCORE 4  . AV block, 3rd degree     Post St. Jude pacemaker, EF 35-40, does have intermittent atrial tachycardia, either PAt or Afib  . BPH (benign prostatic hypertrophy) 11/15/2011  . SOB (shortness of breath) on exertion 02/14/2010    2D Echo - EF 35-45,    Past Surgical History:  Past Surgical History  Procedure Laterality Date  . Pacemaker placement  2007  . Cholecystectomy  2005  . Coronary artery bypass graft  1978  . Lesion excision      from penis  . Cardiac catheterization  06/06/2011    occluded proximal RCA, ostial LAD and mid Circumflex after OM 1.; [place] LIMA-LAD, patent SVG-OM1-OM 2, patent SVG-RCA. Reduced ejection fraction of 30-35%.  . Cardiac catheterization  06/14/2010    LIMA to LAD, SVG to RCA, SVG to OM1  Circumflex, 100% occluded RCA, 100% occluded circumfkex, 100% occluded LAD, no evidence of graft dysfunction, continue medical therapy  . Cardiac catheterization  04/12/2003    3 vessel coronary artery disease, continue medical treatment  . Cardiac catheterization  10/07/2000    Severe 3 vessel coronary artery disease, continue medical treatment    Assessment & Plan Clinical Impression: HPI: [name] is a  79 y.o. male with history of GERD, COPD, CAD, advanced systolic and diastolic heart failure, recent admission for PNA who was readmitted on 10/01/12 with SOB and BLE edema. He was noted to have LUE weakness with numbness and family reported confusion as well as difficulty walking. He was treated with IV diuretics for acute on chronic CHF with improvement in respiratory status and LE edema. CT head done per neurology input due to concerns of subcortical stroke. CT head without acute changes and stable mild atrophy with white matter ischemic changes. Carotid dopplers with right 40-59% ICA stenosis and L-60-79% ICA stenosis. He was changed to plavix per neurology recommendations. He reported problems with constipation and was noted to have SBO. Dr. Derrell Lolling consulted and recommended NGT to low suction. Atrial fibrillation with RVR treated with IV metoprolol and cardiology recommended changing patient to eliquis. Treated with IV vanc/ zosyn for potential aspiration PNA. GI symptoms improving and diet slowly initiated to D3. Patient continues with left sided weakness, distractibility needing redirection as well as intermittent confusion. He needs full supervision to follow aspiration precautions with oral difficulties--may need diet downgrade.   Patient transferred to CIR on 10/08/2012 .   Patient currently requires mod with mobility secondary to muscle weakness, decreased cardiorespiratoy endurance, decreased problem solving and decreased safety awareness and decreased standing balance, hemiplegia and decreased balance strategies.  Prior to hospitalization, patient was independent  with mobility and lived with Son in a House home.  Home access is 2Stairs to enter.  Patient will benefit from skilled PT intervention to maximize safe functional mobility, minimize fall risk and decrease caregiver burden for planned discharge home with 24 hour supervision.  Anticipate patient will benefit from follow up HH at  discharge.  PT - End of Session Activity Tolerance: Tolerates 10 - 20 min activity with multiple rests Endurance Deficit: Yes Endurance Deficit Description: Pt needing rest breaks after standing activity PT Assessment Rehab Potential: Good Barriers to Discharge: None PT Plan PT Intensity: Minimum of 1-2 x/day ,45 to 90 minutes PT Frequency: 5 out of 7 days PT Duration Estimated Length of Stay: 2 weeks PT Treatment/Interventions: Ambulation/gait training;Balance/vestibular training;Cognitive remediation/compensation;Community reintegration;Discharge planning;Disease management/prevention;DME/adaptive equipment instruction;Functional mobility training;Neuromuscular re-education;Pain management;Patient/family education;Psychosocial support;Skin care/wound management;Stair training;Therapeutic Activities;UE/LE Strength taining/ROM;Therapeutic Exercise;UE/LE Coordination activities;Wheelchair propulsion/positioning PT Recommendation Follow Up Recommendations: Home health PT;24 hour supervision/assistance Patient destination: Home Equipment Recommended: Wheelchair (measurements);Wheelchair cushion (measurements);Other (comment) (WC for community mobility)  Skilled Therapeutic Intervention  Tx initiated this session with gait training and balance training. Pt alternated sitting/standing EOB for washing and dressing (See OT FIM). Sit<>stand x4 with 1 UE assist and up to Mod A for lifting. Once standing, pt Min A for steadying only with cues for safety, knee ext and trunk ext. Pt able to retrieve objects from floor with close SBA from seated position. Gait training with RW in controlled environment with Min A x90.' Pt instructed on safe hand placement for all transfers and use of RW and WC. Pt with significant shuffling gait pattern, decreased ability to clear feet with cues.    PT Evaluation Precautions/Restrictions Precautions Precautions: Fall Precaution  Comments: LUE weakness, fragile  skin Restrictions Weight Bearing Restrictions: No Other Position/Activity Restrictions: Support LUE General Chart Reviewed: Yes Family/Caregiver Present: No Vital SignsTherapy Vitals Pulse Rate: 75 Resp: 22 BP: 119/77 mmHg Patient Position, if appropriate: Sitting Oxygen Therapy SpO2: 96 % O2 Device: None (Room air) Pain Pain Assessment Pain Assessment: No/denies pain Pain Score: 0-No pain Home Living/Prior Functioning Home Living Lives With: Son Available Help at Discharge: Family;Available 24 hours/day Type of Home: House Home Access: Stairs to enter [employer] of Steps: 2 Entrance Stairs-Rails: Can reach both Home Layout: One level Bathroom Shower/Tub: Walk-in Contractor: Standard Home Adaptive Equipment: Grab bars in shower;Hand-held shower hose;Grab bars around toilet;Walker - rolling;Straight cane Prior Function Level of Independence: Independent with basic ADLs;Independent with gait;Independent with transfers;Needs assistance with homemaking Able to Take Stairs?: Yes Driving: Yes Vision/Perception  Vision - History Baseline Vision: Wears glasses all the time Patient Visual Report: No change from baseline Vision - Assessment Eye Alignment: Within Functional Limits Vision Assessment: Vision tested Ocular Range of Motion: Within Functional Limits Tracking/Visual Pursuits: Decreased smoothness of horizontal tracking Saccades: Within functional limits Perception Perception: Within Functional Limits Praxis Praxis: Intact  Cognition Overall Cognitive Status: Impaired/Different from baseline Arousal/Alertness: Awake/alert Orientation Level: Oriented to place;Oriented to situation;Oriented to person;Disoriented to time Problem Solving: Impaired Problem Solving Impairment: Functional basic;Functional complex Safety/Judgment: Impaired Comments: Needs cues for safe mobilit, hand placement Sensation Sensation Light Touch: Impaired  Detail Light Touch Impaired Details: Impaired LLE Stereognosis: Not tested Hot/Cold: Not tested Proprioception: Not tested Coordination Gross Motor Movements are Fluid and Coordinated: No Fine Motor Movements are Fluid and Coordinated: No Coordination and Movement Description: Some tremmors noted in RUE, decreased motor control with graded movements in LUE,decreased graded movements in bil LEs, decreased timing coordinating LE movement, impaired UE coordination and graded movements.  Finger Nose Finger Test: decreased motor control with graded movements with LUE Heel Shin Test: Decreased timing and accuracy Motor  Motor Motor: Motor impersistence;Hemiplegia Motor - Skilled Clinical Observations: LUE weakness and motor impersistance noted in bil LEs  Mobility Bed Mobility Bed Mobility: Not assessed Transfers Sit to Stand: 3: Mod assist;With upper extremity assist;From bed;From chair/3-in-1;With armrests Sit to Stand Details: Visual cues/gestures for precautions/safety;Verbal cues for sequencing;Verbal cues for technique;Verbal cues for precautions/safety;Manual facilitation for placement;Manual facilitation for weight shifting Sit to Stand Details (indicate cue type and reason): Cues for anterior translation COG over BOS and for safe hand placement Stand to Sit: 4: Min assist;To chair/3-in-1;To bed;With upper extremity assist Stand to Sit Details (indicate cue type and reason): Tactile cues for initiation;Visual cues/gestures for precautions/safety;Verbal cues for precautions/safety;Visual cues/gestures for sequencing;Manual facilitation for weight shifting Stand to Sit Details: Cues for safe technique and controlled descent Stand Pivot Transfers: 3: Mod assist;With armrests Stand Pivot Transfer Details: Verbal cues for precautions/safety;Verbal cues for technique;Verbal cues for sequencing;Manual facilitation for weight shifting;Manual facilitation for placement Stand Pivot Transfer  Details (indicate cue type and reason): Pt needing cues for safety as well as physical assist for completing turn fully Locomotion  Ambulation Ambulation: Yes Ambulation/Gait Assistance: 3: Mod assist Ambulation Distance (Feet): 30 Feet Assistive device: 2 person hand held assist Ambulation/Gait Assistance Details: Verbal cues for technique;Manual facilitation for weight shifting;Manual facilitation for placement Ambulation/Gait Assistance Details: Assistance for steadying primarily. Pt with decreased  Gait Gait Pattern: Step-to pattern;Decreased step length - right;Decreased step length - left;Decreased stride length;Right flexed knee in stance;Left flexed knee in stance;Shuffle;Trunk flexed;Wide base of support Gait velocity: decreased Stairs / Additional Locomotion Stairs:  Yes Stairs Assistance: 3: Mod assist Stairs Assistance Details: Verbal cues for sequencing;Verbal cues for technique;Verbal cues for precautions/safety;Manual facilitation for weight shifting;Manual facilitation for placement Stairs Assistance Details (indicate cue type and reason): Steadying assist with LUE  Stair Management Technique: One rail Right;Step to pattern;Alternating pattern;Forwards Number of Stairs: 6 Height of Stairs: 6 Wheelchair Mobility Wheelchair Mobility: Yes Wheelchair Assistance: 4: Administrator, sports Details: Visual cues for safe use of DME/AE;Verbal cues for safe use of DME/AE;Verbal cues for technique Wheelchair Propulsion: Right upper extremity;Both lower extermities Wheelchair Parts Management: Needs assistance Distance: 36'  Trunk/Postural Assessment  Cervical Assessment Cervical Assessment: Within Functional Limits Thoracic Assessment Thoracic Assessment: Within Functional Limits Lumbar Assessment Lumbar Assessment: Exceptions to [facility] (Posterior pelvic tilt) Postural Control Postural Control: Deficits on evaluation (flexed trunk posture)  Balance Balance Balance  Assessed: Yes Static Sitting Balance Static Sitting - Balance Support: Feet supported Static Sitting - Level of Assistance: 5: Stand by assistance Static Sitting - Comment/# of Minutes: Dynamic Sitting Balance Dynamic Sitting - Balance Support: Feet supported Dynamic Sitting - Level of Assistance: 5: Stand by assistance Static Standing Balance Static Standing - Balance Support: No upper extremity supported Static Standing - Level of Assistance: 4: Min assist Static Standing - Comment/# of Minutes: Min A static standing x7min with miln ant/post sway Dynamic Standing Balance Dynamic Standing - Balance Support: Right upper extremity supported;Left upper extremity supported Dynamic Standing - Level of Assistance: 4: Min assist Dynamic Standing - Balance Activities: Lateral lean/weight shifting;Forward lean/weight shifting;Reaching for objects;Reaching across midline Dynamic Standing - Comments: Standing to perform self cares with Min A for steadying as pt with flexed knees in stading Extremity Assessment  RUE Assessment RUE Assessment: Within Functional Limits LUE Assessment LUE Assessment: Exceptions to WFL LUE AROM (degrees) LUE Overall AROM Comments: able to elicit full shoulder flexion, however required increased time and effort with decreased motor control LUE Strength LUE Overall Strength Comments: grossly 3/5 RLE Assessment RLE Assessment: Exceptions to [facility] RLE Strength RLE Overall Strength Comments: Grossly 3+/5 hip flexors, but 4+/5 throughout otherwise, however decreased muscular endurance/impersistence LLE Assessment LLE Assessment: Exceptions to [facility] LLE Strength LLE Overall Strength Comments: Grossly 3+/5 hip flexors, but 4+/5 throughout otherwise, however decreased muscular endurance/impersistence  FIM:  FIM - Bed/Chair Transfer Bed/Chair Transfer: 5: Supine > Sit: Supervision (verbal cues/safety issues);5: Sit > Supine: Supervision (verbal cues/safety issues) FIM  - Locomotion: Wheelchair Distance: 30' FIM - Locomotion: Ambulation Ambulation/Gait Assistance: 3: Mod assist   Refer to Care Plan for Long Term Goals  Recommendations for other services: None  Discharge Criteria: Patient will be discharged from PT if patient refuses treatment 3 consecutive times without medical reason, if treatment goals not met, if there is a change in medical status, if patient makes no progress towards goals or if patient is discharged from hospital.  The above assessment, treatment plan, treatment alternatives and goals were discussed and mutually agreed upon: by patient  Virl Cagey 10/09/2012, 12:47 PM

## 2012-10-09 NOTE — Progress Notes (Signed)
Social Work Assessment and Plan Social Work Assessment and Plan  Patient Details  Name: Richard Chavez MRN: [id number] Date of Birth: 04/13/1927  Today's Date: 10/09/2012  Problem List:  Patient Active Problem List   Diagnosis Date Noted  . CVA (cerebral infarction) 10/09/2012  . Atrial fibrillation with RVR 10/03/2012  . Small bowel obstruction 10/03/2012  . Renal failure, acute on chronic 10/03/2012  . Hyperkalemia 10/03/2012  . Stroke, lacunar 10/01/2012  . Acute on chronic combined systolic and diastolic congestive heart failure, NYHA class 3 10/01/2012  . Possible reccuring Aspiration pneumonia, worsened by potential CVA 10/01/2012  . Chronic kidney disease, stage III (moderate) 09/16/2012  . Chronic combined systolic and diastolic congestive heart failure 09/15/2012  . CKD (chronic kidney disease) stage 2, GFR 60-89 ml/min 09/15/2012  . OSA (obstructive sleep apnea) 09/15/2012  . PNA (pneumonia) 05/16/2012  . Hypotension 05/16/2012  . Leukocytosis 05/16/2012  . Renal failure (ARF), acute on chronic 05/16/2012  . Dehydration 05/16/2012  . UTI (lower urinary tract infection) 07/04/2011  . CAD CABG 1998, patent grafts 06/06/11 after abn Myoview 07/04/2011  . Syncope, admitted with dehydration, hypotension, acute renalinsufficency. 07/04/2011  . Pacemaker, St Jude, implanted 2008 07/04/2011  . Cardiomyopathy, ischemic, EF 30-35% by recent cath 07/04/2011  . Edema of both legs 07/04/2011  . Hypertension 04/26/2011  . Heart attack 04/26/2011  . NSVT  04/26/2011  . Hypercholesteremia 04/26/2011  . Allergic rhinitis 04/26/2011  . COPD (chronic obstructive pulmonary disease) 04/26/2011  . GERD (gastroesophageal reflux disease) 04/26/2011  . Barrett's esophagus 04/26/2011   Past Medical History:  Past Medical History  Diagnosis Date  . CAD (coronary artery disease), native coronary artery     Status post CABG  . Hypertension   . Hyperlipidemia   . GERD (gastroesophageal  reflux disease)   . COPD (chronic obstructive pulmonary disease)   . CRF (chronic renal failure)   . Barrett's esophagus   . OP (osteoporosis)   . CAD (coronary artery disease) of artery bypass graft 07/04/2011    LIMA-LAD; SVG-OM2-OM3, SVG-RCA -- all patent as of 05/27/11  . Syncope 07/04/2011  . Pacemaker 07/04/2011  . Cardiomyopathy, ischemic 07/07/2011    2D Echo - EF 35-40, moderately dilated left atrium  . Myocardial infarction     25 yrs ago  . Peripheral vascular disease   . Arthritis   . BPH (benign prostatic hyperplasia)   . Polyp of colon     removed  . Sleep apnea     STOP BANG SCORE 4  . AV block, 3rd degree     Post St. Jude pacemaker, EF 35-40, does have intermittent atrial tachycardia, either PAt or Afib  . BPH (benign prostatic hypertrophy) 11/15/2011  . SOB (shortness of breath) on exertion 02/14/2010    2D Echo - EF 35-45,    Past Surgical History:  Past Surgical History  Procedure Laterality Date  . Pacemaker placement  2007  . Cholecystectomy  2005  . Coronary artery bypass graft  1978  . Lesion excision      from penis  . Cardiac catheterization  06/06/2011    occluded proximal RCA, ostial LAD and mid Circumflex after OM 1.; [place] LIMA-LAD, patent SVG-OM1-OM 2, patent SVG-RCA. Reduced ejection fraction of 30-35%.  . Cardiac catheterization  06/14/2010    LIMA to LAD, SVG to RCA, SVG to OM1 Circumflex, 100% occluded RCA, 100% occluded circumfkex, 100% occluded LAD, no evidence of graft dysfunction, continue medical therapy  . Cardiac catheterization  04/12/2003    3 vessel coronary artery disease, continue medical treatment  . Cardiac catheterization  10/07/2000    Severe 3 vessel coronary artery disease, continue medical treatment   Social History:  reports that he quit smoking about 27 years ago. His smoking use included Cigarettes. He has a 4.5 pack-year smoking history. He has never used smokeless tobacco. He reports that he does not drink alcohol or use  illicit drugs.  Family / Support Systems Marital Status: Widow/Widower Patient Roles: Parent ChildrenShirlean Chavez  [phone] Anticipated Caregiver: Son Ability/Limitations of Caregiver: Son is healthy and always with pt.  He can provide assistance if needed. Caregiver Availability: 24/7 Family Dynamics: Pt and surviving son are very close and do everything together.  He feels very fortunate to have such a good relationship with him.  Pt states: " All the rest are dead."  He is very matter of fact in his conversation.  Social History Preferred language: English Religion: Baptist Cultural Background: No issues Education: [employer] Read: Yes Write: Yes Employment Status: Retired Fish farm manager Issues: No issues Guardian/Conservator: None-according to MD pt is not capable of mkaing his own decisions.  Will look toward son since he is next of kin.  Pt seems to be improving though and can be included in decisions.   Abuse/Neglect Physical Abuse: Denies Verbal Abuse: Denies Sexual Abuse: Denies Exploitation of patient/patient's resources: Denies Self-Neglect: Denies  Emotional Status [name] affect, behavior adn adjustment status: Pt is motivated and feels he will be alright.  He reports: " You all don't need to worry your little heads about me, I'll be alright."  Pt is very matter of fact and aware he does what he wants, although it may not be the best for him. Recent Psychosocial Issues: Other medical issues Pyschiatric History: No history-deferred depression screen due to pt felt not necessary at this time.  Pt appears to be coping appropriately at this time, but  will monitor while here.  He is making good progress and this may be contributing to him doing well. Substance Abuse History: No issues  Patient / Family Perceptions, Expectations & Goals Pt/Family understanding of illness & functional limitations: Pt is able to explain his stroke and deficits.  He  reports his word finding is getting better and his wekaness in his leg.  He feels he can manage at home with his son.  He reports: " I am stubborn, lived this long I will survive." Premorbid pt/family roles/activities: Father, Retiree, Home owner, Church member, etc Anticipated changes in roles/activities/participation: resume Pt/family expectations/goals: Pt states: " I will be alright, I always have, you don't live as long as I have and not made some adjustments."  Pt is very realistic and glad to be up walking period.  Community [employer] Agencies: None Premorbid Home Care/DME Agencies: Other (Comment) (Had HH in the past unsure of the agency) Transportation available at discharge: Unsure now, he was the driver.  Son doesn;t drive, never learned how-pt feels had a traumatic experience with it. Won't push him. Resource referrals recommended: Support group (specify) ([facility] Support group)  Discharge Planning Living Arrangements: Children Support Systems: Children Type of Residence: Private residence Insurance Resources: Medicare;Private Insurance (specify) (UHC supplement) Financial Resources: Restaurant manager, fast food Screen Referred: No Living Expenses: Own Money Management: Patient Do you have any problems obtaining your medications?: No Home Management: Both share in the responsibilities Patient/Family Preliminary Plans: Return home with son who can provide assist if needed.  Son will  need advanced notive if team needs him to come in to make transportation arrangements.  Pt aware will not be allowed to drive, but he may decide once home to do waht he wants.  He should do well here. Social Work Anticipated Follow Up Needs: HH/OP;Support Group DC Planning Additional Notes/Comments: Will provide transportation resources  Clinical Impression Pleasant talkative gentleman who has many life experiences and will adapt to whatever he needs to do for himself.  Supportive son who can  assist if needed. Pt seems to do what he wants and moves forward with it.  Will assist with transportation resources due to pt should not be driving at discharge.  Lucy Chris 10/09/2012, 1:01 PM

## 2012-10-09 NOTE — Evaluation (Signed)
Occupational Therapy Assessment and Plan  Patient Details  Name: Richard Chavez MRN: [id number] Date of Birth: [birth year]  OT Diagnosis: apraxia, ataxia, cognitive deficits, hemiplegia affecting dominant side and muscle weakness (generalized) Rehab Potential: Rehab Potential: Good ELOS: 14 days   Today's Date: 10/09/2012 Time: 1030-1127 Time Calculation (min): 57 min  Problem List:  Patient Active Problem List   Diagnosis Date Noted  . CVA (cerebral infarction) 10/09/2012  . Atrial fibrillation with RVR 10/03/2012  . Small bowel obstruction 10/03/2012  . Renal failure, acute on chronic 10/03/2012  . Hyperkalemia 10/03/2012  . Stroke, lacunar 10/01/2012  . Acute on chronic combined systolic and diastolic congestive heart failure, NYHA class 3 10/01/2012  . Possible reccuring Aspiration pneumonia, worsened by potential CVA 10/01/2012  . Chronic kidney disease, stage III (moderate) 09/16/2012  . Chronic combined systolic and diastolic congestive heart failure 09/15/2012  . CKD (chronic kidney disease) stage 2, GFR 60-89 ml/min 09/15/2012  . OSA (obstructive sleep apnea) 09/15/2012  . PNA (pneumonia) 05/16/2012  . Hypotension 05/16/2012  . Leukocytosis 05/16/2012  . Renal failure (ARF), acute on chronic 05/16/2012  . Dehydration 05/16/2012  . UTI (lower urinary tract infection) 07/04/2011  . CAD CABG 1998, patent grafts 06/06/11 after abn Myoview 07/04/2011  . Syncope, admitted with dehydration, hypotension, acute renalinsufficency. 07/04/2011  . Pacemaker, St Jude, implanted 2008 07/04/2011  . Cardiomyopathy, ischemic, EF 30-35% by recent cath 07/04/2011  . Edema of both legs 07/04/2011  . Hypertension 04/26/2011  . Heart attack 04/26/2011  . NSVT  04/26/2011  . Hypercholesteremia 04/26/2011  . Allergic rhinitis 04/26/2011  . COPD (chronic obstructive pulmonary disease) 04/26/2011  . GERD (gastroesophageal reflux disease) 04/26/2011  . Barrett's esophagus 04/26/2011    Past  Medical History:  Past Medical History  Diagnosis Date  . CAD (coronary artery disease), native coronary artery     Status post CABG  . Hypertension   . Hyperlipidemia   . GERD (gastroesophageal reflux disease)   . COPD (chronic obstructive pulmonary disease)   . CRF (chronic renal failure)   . Barrett's esophagus   . OP (osteoporosis)   . CAD (coronary artery disease) of artery bypass graft 07/04/2011    LIMA-LAD; SVG-OM2-OM3, SVG-RCA -- all patent as of 05/27/11  . Syncope 07/04/2011  . Pacemaker 07/04/2011  . Cardiomyopathy, ischemic 07/07/2011    2D Echo - EF 35-40, moderately dilated left atrium  . Myocardial infarction     25 yrs ago  . Peripheral vascular disease   . Arthritis   . BPH (benign prostatic hyperplasia)   . Polyp of colon     removed  . Sleep apnea     STOP BANG SCORE 4  . AV block, 3rd degree     Post St. Jude pacemaker, EF 35-40, does have intermittent atrial tachycardia, either PAt or Afib  . BPH (benign prostatic hypertrophy) 11/15/2011  . SOB (shortness of breath) on exertion 02/14/2010    2D Echo - EF 35-45,    Past Surgical History:  Past Surgical History  Procedure Laterality Date  . Pacemaker placement  2007  . Cholecystectomy  2005  . Coronary artery bypass graft  1978  . Lesion excision      from penis  . Cardiac catheterization  06/06/2011    occluded proximal RCA, ostial LAD and mid Circumflex after OM 1.; [place] LIMA-LAD, patent SVG-OM1-OM 2, patent SVG-RCA. Reduced ejection fraction of 30-35%.  . Cardiac catheterization  06/14/2010    LIMA to LAD, SVG to  RCA, SVG to OM1 Circumflex, 100% occluded RCA, 100% occluded circumfkex, 100% occluded LAD, no evidence of graft dysfunction, continue medical therapy  . Cardiac catheterization  04/12/2003    3 vessel coronary artery disease, continue medical treatment  . Cardiac catheterization  10/07/2000    Severe 3 vessel coronary artery disease, continue medical treatment    Assessment & Plan Clinical  Impression: Patient is a 79 y.o. left handed male with history of GERD, COPD, CAD, advanced systolic and diastolic heart failure, recent admission for PNA who was readmitted on 10/01/12 with SOB and BLE edema. He was noted to have LUE weakness with numbness and family reported confusion as well as difficulty walking. He was treated with IV diuretics for acute on chronic CHF with improvement in respiratory status and LE edema. CT head done per neurology input due to concerns of subcortical stroke. CT head without acute changes and stable mild atrophy with white matter ischemic changes. Carotid dopplers with right 40-59% ICA stenosis and L-60-79% ICA stenosis. He was changed to plavix per neurology recommendations. He reported problems with constipation and was noted to have SBO. Dr. Derrell Lolling consulted and recommended NGT to low suction. Atrial fibrillation with RVR treated with IV metoprolol and cardiology recommended changing patient to eliquis. Treated with IV vanc/ zosyn for potential aspiration PNA. GI symptoms improving and diet slowly initiated to D3. Patient continues with left sided weakness, distractibility needing redirection as well as intermittent confusion.   Patient transferred to CIR on 10/08/2012 .    Patient currently requires max with basic self-care skills secondary to muscle weakness, decreased cardiorespiratoy endurance, impaired timing and sequencing, unbalanced muscle activation, motor apraxia, ataxia and decreased coordination and decreased standing balance and decreased postural control.  Prior to hospitalization, patient could complete ADLs with independent .  Patient will benefit from skilled intervention to decrease level of assist with basic self-care skills and increase independence with basic self-care skills prior to discharge home with care partner.  Anticipate patient will require intermittent supervision and follow up home health.  OT - End of Session Activity Tolerance:  Tolerates 30+ min activity with multiple rests Endurance Deficit: Yes OT Assessment Rehab Potential: Good Barriers to Discharge: None OT Plan OT Intensity: Minimum of 1-2 x/day, 45 to 90 minutes OT Frequency: 5 out of 7 days OT Duration/Estimated Length of Stay: 14 days OT Treatment/Interventions: Balance/vestibular training;Cognitive remediation/compensation;Discharge planning;DME/adaptive equipment instruction;Functional mobility training;Neuromuscular re-education;Pain management;Patient/family education;Psychosocial support;Self Care/advanced ADL retraining;Therapeutic Activities;Therapeutic Exercise;UE/LE Strength taining/ROM;UE/LE Coordination activities OT Recommendation Patient destination: Home Follow Up Recommendations: Home health OT (TBD) Equipment Recommended: Tub/shower seat   Skilled Therapeutic Intervention OT eval, ADL assessment conducted with performing functional mobility in ADL apt without AD.  Pt already completed bathing and dressing with RN and carried over into PT session.  From PT report, pt required mod assist to don shirt and max assist with LB dressing secondary to weak LUE being pt's dominant UE.  Pt completed grooming in standing with assistance to open toothpaste and steady assist in standing.  Engaged in bed mobility and toilet transfer with pt requiring min-mod assist with mobility as pt tends to furniture walk and requires increased cues for safety.    OT Evaluation Precautions/Restrictions  Precautions Precautions: Fall Precaution Comments: LUE weakness, fragile skin Restrictions Weight Bearing Restrictions: No Other Position/Activity Restrictions: Support LUE General   Vital Signs Oxygen Therapy SpO2: 96 % O2 Device: None (Room air) Pain Pain Assessment Pain Assessment: No/denies pain Pain Score: 0-No pain Home Living/Prior Functioning  Home Living Lives With: Son Available Help at Discharge: Family;Available 24 hours/day Type of Home:  House Home Access: Stairs to enter [employer] of Steps: 2 Entrance Stairs-Rails: Can reach both Home Layout: One level Bathroom Shower/Tub: Walk-in Contractor: Standard Home Adaptive Equipment: Grab bars in shower;Hand-held shower hose;Grab bars around toilet;Walker - rolling;Straight cane Prior Function Level of Independence: Independent with basic ADLs;Independent with gait;Independent with transfers;Needs assistance with homemaking Able to Take Stairs?: Yes Driving: Yes ADL ADL Grooming: Minimal assistance Where Assessed-Grooming: Standing at sink Upper Body Dressing: Moderate assistance Where Assessed-Upper Body Dressing: Wheelchair Lower Body Dressing: Maximal assistance Where Assessed-Lower Body Dressing: Wheelchair Toileting: Minimal assistance Where Assessed-Toileting: Teacher, adult education: Curator Method: Proofreader: Grab bars Vision/Perception  Vision - History Baseline Vision: Wears glasses all the time Patient Visual Report: No change from baseline Vision - Assessment Eye Alignment: Within Functional Limits Vision Assessment: Vision tested Ocular Range of Motion: Within Functional Limits Tracking/Visual Pursuits: Decreased smoothness of horizontal tracking Saccades: Within functional limits Perception Perception: Within Functional Limits Praxis Praxis: Intact  Cognition Overall Cognitive Status: Impaired/Different from baseline Arousal/Alertness: Awake/alert Orientation Level: Oriented to place;Oriented to situation;Oriented to person;Disoriented to time Problem Solving: Impaired Problem Solving Impairment: Functional basic;Functional complex Safety/Judgment: Impaired Comments: Needs cues for safe mobilit, hand placement Sensation Sensation Light Touch: Impaired Detail Light Touch Impaired Details: Impaired LLE Stereognosis: Not tested Hot/Cold: Not  tested Proprioception: Not tested Coordination Gross Motor Movements are Fluid and Coordinated: No Fine Motor Movements are Fluid and Coordinated: No Coordination and Movement Description: Some tremmors noted in RUE, decreased motor control with graded movements in LUE,decreased graded movements in bil LEs, decreased timing coordinating LE movement, impaired UE coordination and graded movements.  Finger Nose Finger Test: decreased motor control with graded movements with LUE Heel Shin Test: Decreased timing and accuracy Motor  Motor Motor: Motor impersistence;Hemiplegia Motor - Skilled Clinical Observations: LUE weakness and motor impersistance noted in bil LEs Mobility  Bed Mobility Bed Mobility: Not assessed Transfers Sit to Stand: 3: Mod assist;With upper extremity assist;From bed;From chair/3-in-1;With armrests Sit to Stand Details: Visual cues/gestures for precautions/safety;Verbal cues for sequencing;Verbal cues for technique;Verbal cues for precautions/safety;Manual facilitation for placement;Manual facilitation for weight shifting Sit to Stand Details (indicate cue type and reason): Cues for anterior translation COG over BOS and for safe hand placement Stand to Sit: 4: Min assist;To chair/3-in-1;To bed;With upper extremity assist Stand to Sit Details (indicate cue type and reason): Tactile cues for initiation;Visual cues/gestures for precautions/safety;Verbal cues for precautions/safety;Visual cues/gestures for sequencing;Manual facilitation for weight shifting Stand to Sit Details: Cues for safe technique and controlled descent  Trunk/Postural Assessment  Cervical Assessment Cervical Assessment: Within Functional Limits Thoracic Assessment Thoracic Assessment: Within Functional Limits Lumbar Assessment Lumbar Assessment: Exceptions to [facility] (Posterior pelvic tilt) Postural Control Postural Control: Deficits on evaluation (flexed trunk posture)  Balance Balance Balance  Assessed: Yes Extremity/Trunk Assessment RUE Assessment RUE Assessment: Within Functional Limits LUE Assessment LUE Assessment: Exceptions to WFL LUE AROM (degrees) LUE Overall AROM Comments: able to elicit full shoulder flexion, however required increased time and effort with decreased motor control LUE Strength LUE Overall Strength Comments: grossly 3/5  FIM:  FIM - Grooming Grooming Steps: Wash, rinse, dry face;Oral care, brush teeth, clean dentures;Brush, comb hair;Wash, rinse, dry hands Grooming: 4: Steadying assist  or patient completes 3 of 4 or 4 of 5 steps FIM - Bathing Bathing: 0: Activity did not occur FIM - Upper Body Dressing/Undressing Upper body dressing/undressing  steps patient completed: Thread/unthread right sleeve of pullover shirt/dresss;Put head through opening of pull over shirt/dress Upper body dressing/undressing: 3: Mod-Patient completed 50-74% of tasks FIM - Lower Body Dressing/Undressing Lower body dressing/undressing steps patient completed: Thread/unthread right underwear leg;Thread/unthread right pants leg Lower body dressing/undressing: 2: Max-Patient completed 25-49% of tasks FIM - Toileting Toileting steps completed by patient: Adjust clothing prior to toileting;Performs perineal hygiene;Adjust clothing after toileting Toileting Assistive Devices: Grab bar or rail for support Toileting: 4: Steadying assist FIM - Games developer Transfer: 5: Supine > Sit: Supervision (verbal cues/safety issues);5: Sit > Supine: Supervision (verbal cues/safety issues);4: Chair or W/C > Bed: Min A (steadying Pt. > 75%);3: Bed > Chair or W/C: Mod A (lift or lower assist) FIM - Diplomatic Services operational officer Devices: Grab bars Toilet Transfers: 4-To toilet/BSC: Min A (steadying Pt. > 75%);4-From toilet/BSC: Min A (steadying Pt. > 75%)   Refer to Care Plan for Long Term Goals  Recommendations for other services: None  Discharge Criteria:  Patient will be discharged from OT if patient refuses treatment 3 consecutive times without medical reason, if treatment goals not met, if there is a change in medical status, if patient makes no progress towards goals or if patient is discharged from hospital.  The above assessment, treatment plan, treatment alternatives and goals were discussed and mutually agreed upon: by patient  Leonette Monarch 10/09/2012, 11:51 AM

## 2012-10-09 NOTE — Plan of Care (Signed)
Problem: RH SKIN INTEGRITY Goal: RH STG MAINTAIN SKIN INTEGRITY WITH ASSISTANCE STG Maintain Skin Integrity With Assistance. Min assist  Outcome: Progressing Skin tears to bilateral upper extremities

## 2012-10-09 NOTE — Plan of Care (Signed)
Overall Plan of Care [facility]) Patient Details Name: Richard Chavez MRN: [id number] DOB: 21-Feb-1927  Diagnosis:  Subcortical CVA, left hemiparesis  Co-morbidities: CAD (coronary artery disease), native coronary artery  Status post CABG  .  Hypertension  .  Hyperlipidemia  .  GERD (gastroesophageal reflux disease)  .  COPD (chronic obstructive pulmonary disease)  .  CRF (chronic renal failure)  .  Barrett's esophagus  .  OP (osteoporosis)  .  CAD (coronary artery disease) of artery bypass graft  07/04/2011  LIMA-LAD; SVG-OM2-OM3, SVG-RCA -- all patent as of 05/27/11  .  Syncope  07/04/2011  .  Pacemaker  07/04/2011  .  Cardiomyopathy, ischemic  07/07/2011  2D Echo - EF 35-40, moderately dilated left atrium  .  Myocardial infarction  25 yrs ago  .  Peripheral vascular disease  .  Arthritis  .  BPH (benign prostatic hyperplasia)  .  Polyp of colon    Functional Problem List  Patient demonstrates impairments in the following areas: Balance, Bladder, Bowel, Cognition, Edema, Endurance, Medication Management, Motor, Pain, Safety, Sensory  and Skin Integrity  Basic ADL's: grooming, bathing, dressing and toileting Advanced ADL's: simple meal preparation  Transfers:  bed mobility, bed to chair, toilet, tub/shower, car and furniture Locomotion:  ambulation, wheelchair mobility and stairs  Additional Impairments:  Functional use of upper extremity  Anticipated Outcomes Item Anticipated Outcome  Eating/Swallowing  Mod I   Basic self-care  supervision  Tolieting  supervision  Bowel/Bladder  Continent of bowel and bladder  Transfers  Supervision  Locomotion  Supervision with LRAD  Communication    Cognition  Supervision   Pain  Less or equal to 3  Safety/Judgment  Adhere to safety protocol  Other     Therapy Plan: PT Intensity: Minimum of 1-2 x/day ,45 to 90 minutes PT Frequency: 5 out of 7 days PT Duration Estimated Length of Stay: 2 weeks OT  Intensity: Minimum of 1-2 x/day, 45 to 90 minutes OT Frequency: 5 out of 7 days OT Duration/Estimated Length of Stay: 14 days SLP Intensity: Minumum of 1-2 x/day, 30 to 90 minutes SLP Frequency: 5 out of 7 days SLP Duration/Estimated Length of Stay: 1 week for SLP    Team Interventions: Item RN PT OT SLP SW TR Other  Self Care/Advanced ADL Retraining   x      Neuromuscular Re-Education  x x      Therapeutic Activities  x x x     UE/LE Strength Training/ROM  x x      UE/LE Coordination Activities  x x      Visual/Perceptual Remediation/Compensation   x      DME/Adaptive Equipment Instruction  x x      Therapeutic Exercise  x x      Balance/Vestibular Training  x x      Patient/Family Education  x x x     Cognitive Remediation/Compensation  x x      Functional Mobility Training  x x      Ambulation/Gait Training  x       Museum/gallery curator  x       Wheelchair Propulsion/Positioning  x       Functional Tourist information centre manager Reintegration  x       Dysphagia/Aspiration Printmaker    x     Speech/Language Facilitation         Bladder Management x  Bowel Management x        Disease Management/Prevention x x       Pain Management x x x      Medication Management x        Skin Care/Wound Management x x       Splinting/Orthotics         Discharge Planning x x x x     Psychosocial Support x x x                             Team Discharge Planning: Destination: PT-Home ,OT- Home , SLP-Home Projected Follow-up: PT-Home health PT;24 hour supervision/assistance, OT-  Home health OT (TBD), SLP-24 hour supervision/assistance Projected Equipment Needs: PT-Wheelchair (measurements);Wheelchair cushion (measurements);Other (comment) (WC for community mobility), OT- Tub/shower seat, SLP-None recommended by SLP Patient/family involved in discharge planning: PT- Patient,  OT-Patient, SLP-Patient  MD ELOS: 2 weeks Medical Rehab Prognosis:   Excellent Assessment: 79 year old male admitted for weakness numbness left upper extremity. CT head showed no acute stroke. Could not do MRI secondary to pacemaker. Patient now requires CIR level PT OT as well as 24 7 rehabilitation RN and M.D. Supervision level goals for mobility and ADLs. Monitor cardiac status during rehabilitation program given history of cardiomyopathy.   See Team Conference Notes for weekly updates to the plan of care

## 2012-10-09 NOTE — Plan of Care (Signed)
Problem: RH BOWEL ELIMINATION Goal: RH STG MANAGE BOWEL WITH ASSISTANCE STG Manage Bowel with Assistance. Modified independent  Outcome: Progressing No incontinent episode reported

## 2012-10-09 NOTE — Progress Notes (Signed)
Physical Therapy Session Note  Patient Details  Name: Richard Chavez MRN: [id number] Date of Birth: Jun 10, 1926  Today's Date: 10/09/2012 Time: 1130-1200 Time Calculation (min): 30 min  Skilled Therapeutic Interventions/Progress Updates:   Session focused on functional transfers using RW and neuro re-ed for LUE and balance training with reaching activity. Pt slightly impulsive with transfers and requires cues to take his time, hand placement, and RW management. Pt verbalizes that he does not the hand splint on his RW - educated on it's use/importance to try at this time and pt agreeable. Pt able to reach and manipulate horseshoes during reaching activity with LUE (seated with LUE and standing with RUE) with up to mod A to control UE. Positioned with pillow under LUE in w/c for support at end of session.   Therapy Documentation Precautions:  Precautions Precautions: Fall Precaution Comments: LUE weakness, fragile skin Restrictions Weight Bearing Restrictions: No Other Position/Activity Restrictions: Support LUE  Pain: Denies pain.   See FIM for current functional status  Therapy/Group: Individual Therapy  Karolee Stamps [facility] 10/09/2012, 12:02 PM

## 2012-10-09 NOTE — Progress Notes (Signed)
Patient ID: Richard Chavez, male   DOB: 07-18-1926, 79 y.o.   MRN: [id number] Subjective/Complaints: 79 y.o. male with history of GERD, COPD, CAD, advanced systolic and diastolic heart failure, recent admission for PNA who was readmitted on 10/01/12 with SOB and BLE edema. He was noted to have LUE weakness with numbness and family reported confusion as well as difficulty walking. He was treated with IV diuretics for acute on chronic CHF with improvement in respiratory status and LE edema. CT head done per neurology input due to concerns of subcortical stroke. CT head without acute changes and stable mild atrophy with white matter ischemic changes. Carotid dopplers with right 40-59% ICA stenosis and L-60-79% ICA stenosis. He was changed to plavix per neurology recommendations. He reported problems with constipation and was noted to have SBO. Dr. Derrell Lolling consulted and recommended NGT to low suction. Atrial fibrillation with RVR treated with IV metoprolol and cardiology recommended changing patient to eliquis. Treated with IV vanc/ zosyn for potential aspiration PNA  ROS   Objective: Vital Signs: Blood pressure 121/67, pulse 61, temperature 97.5 F (36.4 C), temperature source Oral, resp. rate 21, height 6\' 2"  (1.88 m), weight 104 kg (229 lb 4.5 oz), SpO2 98.00%. No results found. Results for orders placed during the hospital encounter of 10/08/12 (from the past 72 hour(s))  CBC WITH DIFFERENTIAL     Status: Abnormal   Collection Time    10/09/12  5:10 AM      Result Value Range   WBC 5.9  4.0 - 10.5 K/uL   RBC 4.37  4.22 - 5.81 MIL/uL   Hemoglobin 12.4 (*) 13.0 - 17.0 g/dL   HCT 82.9 (*) 56.2 - 13.0 %   MCV 85.6  78.0 - 100.0 fL   MCH 28.4  26.0 - 34.0 pg   MCHC 33.2  30.0 - 36.0 g/dL   RDW 86.5 (*) 78.4 - 69.6 %   Platelets 160  150 - 400 K/uL   Neutrophils Relative % 54  43 - 77 %   Neutro Abs 3.2  1.7 - 7.7 K/uL   Lymphocytes Relative 33  12 - 46 %   Lymphs Abs 2.0  0.7 - 4.0 K/uL   Monocytes Relative 9  3 - 12 %   Monocytes Absolute 0.5  0.1 - 1.0 K/uL   Eosinophils Relative 4  0 - 5 %   Eosinophils Absolute 0.2  0.0 - 0.7 K/uL   Basophils Relative 0  0 - 1 %   Basophils Absolute 0.0  0.0 - 0.1 K/uL  COMPREHENSIVE METABOLIC PANEL     Status: Abnormal   Collection Time    10/09/12  5:10 AM      Result Value Range   Sodium 137  135 - 145 mEq/L   Potassium 3.3 (*) 3.5 - 5.1 mEq/L   Chloride 106  96 - 112 mEq/L   CO2 23  19 - 32 mEq/L   Glucose, Bld 97  70 - 99 mg/dL   BUN 21  6 - 23 mg/dL   Creatinine, Ser 2.95  0.50 - 1.35 mg/dL   Calcium 8.4  8.4 - 28.4 mg/dL   Total Protein 5.5 (*) 6.0 - 8.3 g/dL   Albumin 2.3 (*) 3.5 - 5.2 g/dL   AST 16  0 - 37 U/L   ALT 15  0 - 53 U/L   Alkaline Phosphatase 44  39 - 117 U/L   Total Bilirubin 0.5  0.3 - 1.2 mg/dL  GFR calc non Af Amer 59 (*) >90 mL/min   GFR calc Af Amer 68 (*) >90 mL/min   Comment:            The eGFR has been calculated     using the CKD EPI equation.     This calculation has not been     validated in all clinical     situations.     eGFR's persistently     <90 mL/min signify     possible Chronic Kidney Disease.      Nursing note and vitals reviewed.  Constitutional: He is oriented to person, place, and time. He appears well-developed and well-nourished.  HENT:  Head: Normocephalic and atraumatic.  Eyes: Pupils are equal, round, and reactive to light.  Neck: Normal range of motion. Neck supple.  Cardiovascular: Normal rate and regular rhythm.  Pulmonary/Chest: Effort normal. No respiratory distress. He has no wheezes.  Abdominal: He exhibits distension.  Round, decreased BS.  Musculoskeletal: He exhibits no edema.  Neurological: He is alert and oriented to person,hospital,. Needed cues for date, day of week. Tends to laugh through deficits but acknowledges them.  Mild dysarthria. Follows commands without difficulty. LUE 3- delt, 3 bicep,tricep, WE,HI are 3 to 3+/5. Marland Kitchen LLE grossly 3/5 HF, 3+  to 4- KE, 4/5 ADF. Decreased sensation bilateral feet in stocking glove distribution but otherwise non-focal. Right side grossly 4 to 5/5.  Skin: Skin is warm and dry. Rash (Right shin/foot.) noted. Chronic vascular changes Psychiatric:  Less Restless but distractible at times    Assessment/Plan: 1. Functional deficits secondary to Right subcortical stroke with left HP which require 3+ hours per day of interdisciplinary therapy in a comprehensive inpatient rehab setting. Physiatrist is providing close team supervision and 24 hour management of active medical problems listed below. Physiatrist and rehab team continue to assess barriers to discharge/monitor patient progress toward functional and medical goals. FIM:                   Comprehension Comprehension: 5-Understands complex 90% of the time/Cues < 10% of the time  Expression Expression: 5-Expresses basic needs/ideas: With no assist  Social Interaction Social Interaction: 5-Interacts appropriately 90% of the time - Needs monitoring or encouragement for participation or interaction.  Problem Solving Problem Solving: 5-Solves complex 90% of the time/cues < 10% of the time  Memory Memory: 6-More than reasonable amt of time  Medical Problem List and Plan:  1. DVT Prophylaxis/Anticoagulation: Pharmaceutical: Other (comment)  2. Pain Management: tylenol, gabapentin for neuropathic pain  3. Mood: team to provide egosupport. Pt shows no signs of depression on examination, however cognition and attention don't allow for a reliable assessment.  4. Neuropsych: This patient is not capable of making decisions on his own behalf.  5. Atrial Fibrillation: Monitor HR with bid checks. Continues with tachycardia with activity--likely due to deconditioned. Will continue  6. Acute on chronic renal failure: Lasix discontinued. May need to stay dry to avoid recurrent exacerbation of CHF. Low salt diet. Check daily weights.  7. Acute on  chroniccombined systolic and diastolic CHF:  8. Recent PNA with question of Aspiration PNA: completed 7/7 days antibiotic.  9. COPD: Respiratory status improving. Continue combivent bid with spiriva and low dose oral steroids   LOS (Days) 1 A FACE TO FACE EVALUATION WAS PERFORMED  Alveta Quintela E 10/09/2012, 7:32 AM

## 2012-10-09 NOTE — Evaluation (Signed)
Speech Language Pathology Assessment and Plan  Patient Details  Name: Richard Chavez MRN: [id number] Date of Birth: 05-Jun-1926  SLP Diagnosis: Dysphagia  Rehab Potential: Good ELOS: 1 week for SLP   Today's Date: 10/09/2012 Time: 1478-2956 Time Calculation (min): 50 min  Problem List:  Patient Active Problem List   Diagnosis Date Noted  . CVA (cerebral infarction) 10/09/2012  . Atrial fibrillation with RVR 10/03/2012  . Small bowel obstruction 10/03/2012  . Renal failure, acute on chronic 10/03/2012  . Hyperkalemia 10/03/2012  . Stroke, lacunar 10/01/2012  . Acute on chronic combined systolic and diastolic congestive heart failure, NYHA class 3 10/01/2012  . Possible reccuring Aspiration pneumonia, worsened by potential CVA 10/01/2012  . Chronic kidney disease, stage III (moderate) 09/16/2012  . Chronic combined systolic and diastolic congestive heart failure 09/15/2012  . CKD (chronic kidney disease) stage 2, GFR 60-89 ml/min 09/15/2012  . OSA (obstructive sleep apnea) 09/15/2012  . PNA (pneumonia) 05/16/2012  . Hypotension 05/16/2012  . Leukocytosis 05/16/2012  . Renal failure (ARF), acute on chronic 05/16/2012  . Dehydration 05/16/2012  . UTI (lower urinary tract infection) 07/04/2011  . CAD CABG 1998, patent grafts 06/06/11 after abn Myoview 07/04/2011  . Syncope, admitted with dehydration, hypotension, acute renalinsufficency. 07/04/2011  . Pacemaker, St Jude, implanted 2008 07/04/2011  . Cardiomyopathy, ischemic, EF 30-35% by recent cath 07/04/2011  . Edema of both legs 07/04/2011  . Hypertension 04/26/2011  . Heart attack 04/26/2011  . NSVT  04/26/2011  . Hypercholesteremia 04/26/2011  . Allergic rhinitis 04/26/2011  . COPD (chronic obstructive pulmonary disease) 04/26/2011  . GERD (gastroesophageal reflux disease) 04/26/2011  . Barrett's esophagus 04/26/2011   Past Medical History:  Past Medical History  Diagnosis Date  . CAD (coronary artery disease), native  coronary artery     Status post CABG  . Hypertension   . Hyperlipidemia   . GERD (gastroesophageal reflux disease)   . COPD (chronic obstructive pulmonary disease)   . CRF (chronic renal failure)   . Barrett's esophagus   . OP (osteoporosis)   . CAD (coronary artery disease) of artery bypass graft 07/04/2011    LIMA-LAD; SVG-OM2-OM3, SVG-RCA -- all patent as of 05/27/11  . Syncope 07/04/2011  . Pacemaker 07/04/2011  . Cardiomyopathy, ischemic 07/07/2011    2D Echo - EF 35-40, moderately dilated left atrium  . Myocardial infarction     25 yrs ago  . Peripheral vascular disease   . Arthritis   . BPH (benign prostatic hyperplasia)   . Polyp of colon     removed  . Sleep apnea     STOP BANG SCORE 4  . AV block, 3rd degree     Post St. Jude pacemaker, EF 35-40, does have intermittent atrial tachycardia, either PAt or Afib  . BPH (benign prostatic hypertrophy) 11/15/2011  . SOB (shortness of breath) on exertion 02/14/2010    2D Echo - EF 35-45,    Past Surgical History:  Past Surgical History  Procedure Laterality Date  . Pacemaker placement  2007  . Cholecystectomy  2005  . Coronary artery bypass graft  1978  . Lesion excision      from penis  . Cardiac catheterization  06/06/2011    occluded proximal RCA, ostial LAD and mid Circumflex after OM 1.; [place] LIMA-LAD, patent SVG-OM1-OM 2, patent SVG-RCA. Reduced ejection fraction of 30-35%.  . Cardiac catheterization  06/14/2010    LIMA to LAD, SVG to RCA, SVG to OM1 Circumflex, 100% occluded RCA, 100% occluded  circumfkex, 100% occluded LAD, no evidence of graft dysfunction, continue medical therapy  . Cardiac catheterization  04/12/2003    3 vessel coronary artery disease, continue medical treatment  . Cardiac catheterization  10/07/2000    Severe 3 vessel coronary artery disease, continue medical treatment    Assessment / Plan / Recommendation Clinical Impression  Richard Chavez is an 79 y.o. male with history of GERD, COPD, CAD,  advanced systolic and diastolic heart failure, recent admission for PNA who was readmitted on 10/01/12 with SOB and BLE edema. He was noted to have LUE weakness with numbness and family reported confusion as well as difficulty walking. He was treated with IV diuretics for acute on chronic CHF with improvement in respiratory status and LE edema. CT head done per neurology input due to concerns of subcortical stroke. CT head without acute changes and stable mild atrophy with white matter ischemic changes. Carotid dopplers with right 40-59% ICA stenosis and L-60-79% ICA stenosis. He was changed to plavix per neurology recommendations. He reported problems with constipation and was noted to have SBO. Dr. Derrell Lolling consulted and recommended NGT to low suction. Atrial fibrillation with RVR treated with IV metoprolol and cardiology recommended changing patient to eliquis. Treated with IV vanc/zosyn for potential aspiration PNA. GI symptoms improving and diet slowly initiated to Dys.3 textures. Patient continues with left sided weakness, distractibility needing redirection as well as intermittent confusion. He needs full supervision to follow aspiration precautions with oral difficulties--may need diet downgrade. Therapy team recommending CIR and patient admitted 10/09/11.   Bedside Swallow and Cognitive-Linguistic Evaluations completed.  Patient appears to be a baseline with cognitive-linguistic tasks and has trace-mild left sided oral weakness.  Additionally patient reports that dentures are being fixed and SLP suspects that this is the factor impacting speech intelligibility as well as mastication; however, patient would benefit from skilled SLP services to address goals for safest and least restrictive p.o. intake.  This will maximize functional independence and reduce burden of care after discharge.      SLP Assessment  Patient will need skilled Speech Lanaguage Pathology Services during CIR admission     Recommendations  Diet Recommendations: Dysphagia 2 (Fine chop);Thin liquid Liquid Administration via: Cup;Straw Medication Administration: Whole meds with liquid Supervision: Patient able to self feed;Intermittent supervision to cue for compensatory strategies Compensations: Small sips/bites;Slow rate;Check for pocketing Postural Changes and/or Swallow Maneuvers: Seated upright 90 degrees;Upright 30-60 min after meal Oral Care Recommendations: Oral care BID Patient destination: Home Follow up Recommendations: 24 hour supervision/assistance Equipment Recommended: None recommended by SLP    SLP Frequency 5 out of 7 days   SLP Treatment/Interventions Functional tasks;Patient/family education;Dysphagia/aspiration precaution training    Pain Pain Assessment Pain Assessment: No/denies pain Prior Functioning Cognitive/Linguistic Baseline: Within functional limits (with basic tasks per pt report son managed finances and meds) Vocation: Retired  Teacher, music Term Goals: Week 1: SLP Short Term Goal 1 (Week 1): Patient will demonstrate effecient mastication of Dys.3 textures throughout a meal with Mod I use of safe swallow strategies.  See FIM for current functional status Refer to Care Plan for Long Term Goals  Recommendations for other services: None  Discharge Criteria: Patient will be discharged from SLP if patient refuses treatment 3 consecutive times without medical reason, if treatment goals not met, if there is a change in medical status, if patient makes no progress towards goals or if patient is discharged from hospital.  The above assessment, treatment plan, treatment alternatives and goals were discussed and mutually  agreed upon: by patient  Fae Pippin, M.A., CCC-SLP [phone]  Adalynn Corne 10/09/2012, 2:57 PM

## 2012-10-09 NOTE — Plan of Care (Signed)
Problem: RH SAFETY Goal: RH STG ADHERE TO SAFETY PRECAUTIONS W/ASSISTANCE/DEVICE STG Adhere to Safety Precautions With supervision  Outcome: Progressing No unsafe behavior reported. Safety belt in use in w/c

## 2012-10-09 NOTE — Care Management Note (Signed)
[facility] Individual Statement of Services  Patient Name:  Richard Chavez  Date:  10/09/2012  Welcome [facility] the [facility].  Our goal is to provide you with an individualized program based on your diagnosis and situation, designed to meet your specific needs.  With this comprehensive rehabilitation program, you will be expected to participate in at least 3 hours of rehabilitation therapies Monday-Friday, with modified therapy programming on the weekends.  Your rehabilitation program will include the following services:  Physical Therapy (PT), Occupational Therapy (OT), Speech Therapy (ST), 24 hour per day rehabilitation nursing, Case Management ( Social Worker), Rehabilitation Medicine, Nutrition Services and Pharmacy Services  Weekly team conferences will be held on Wednesday to discuss your progress.  Your Social Worker will talk with you frequently to get your input and to update you on team discussions.  Team conferences with you and your family in attendance may also be held.  Expected length of stay: 2 weeks Overall anticipated outcome: supervision with set up  Depending on your progress and recovery, your program may change. Your Social Worker will coordinate services and will keep you informed of any changes. Your Child psychotherapist names and contact numbers are listed  below.  The following services may also be recommended but are not provided by the [facility]:   Driving Evaluations  Home Health Rehabiltiation Services  Outpatient Rehabilitatation Servives    Arrangements will be made to provide these services after discharge if needed.  Arrangements include referral to agencies that provide these services.  Your insurance has been verified to be:  Medicare & [employer] Your primary doctor is:  Dr. Tawana Scale  Pertinent information will be shared with your doctor and your insurance company.  Social Worker:  Dossie Der, [place]  [phone]  Information discussed with and copy given to patient by: Lucy Chris, 10/09/2012, 1:04 PM

## 2012-10-10 ENCOUNTER — Inpatient Hospital Stay (HOSPITAL_COMMUNITY): Payer: Medicare Other | Admitting: Physical Therapy

## 2012-10-10 ENCOUNTER — Inpatient Hospital Stay (HOSPITAL_COMMUNITY): Payer: Medicare Other | Admitting: Speech Pathology

## 2012-10-10 ENCOUNTER — Encounter (HOSPITAL_COMMUNITY): Payer: Medicare Other | Admitting: Occupational Therapy

## 2012-10-10 LAB — URINE CULTURE

## 2012-10-10 MED ORDER — ENSURE COMPLETE PO LIQD
120.0000 mL | Freq: Three times a day (TID) | ORAL | Status: DC
  Administered 2012-10-10 – 2012-10-16 (×11): 120 mL via ORAL

## 2012-10-10 NOTE — Progress Notes (Signed)
Patient ID: FRIEND DORFMAN, male   DOB: [birth year], 79 y.o.   MRN: [id number] Subjective/Complaints: 79 y.o. male with history of GERD, COPD, CAD, advanced systolic and diastolic heart failure, recent admission for PNA who was readmitted on 10/01/12 with SOB and BLE edema. He was noted to have LUE weakness with numbness and family reported confusion as well as difficulty walking. He was treated with IV diuretics for acute on chronic CHF with improvement in respiratory status and LE edema. CT head done per neurology input due to concerns of subcortical stroke. CT head without acute changes and stable mild atrophy with white matter ischemic changes. Carotid dopplers with right 40-59% ICA stenosis and L-60-79% ICA stenosis. He was changed to plavix per neurology recommendations. He reported problems with constipation and was noted to have SBO. Dr. Derrell Lolling consulted and recommended NGT to low suction. Atrial fibrillation with RVR treated with IV metoprolol and cardiology recommended changing patient to eliquis. Treated with IV vanc/ zosyn for potential aspiration PNA Tired from therapy yesterday Review of Systems  Gastrointestinal: Negative for nausea, vomiting and constipation.  Genitourinary: Negative for urgency.  Psychiatric/Behavioral: Negative for depression.  All other systems reviewed and are negative.     Objective: Vital Signs: Blood pressure 134/72, pulse 68, temperature 97.6 F (36.4 C), temperature source Oral, resp. rate 19, height 6\' 2"  (1.88 m), weight 103.2 kg (227 lb 8.2 oz), SpO2 96.00%. No results found. Results for orders placed during the hospital encounter of 10/08/12 (from the past 72 hour(s))  CBC WITH DIFFERENTIAL     Status: Abnormal   Collection Time    10/09/12  5:10 AM      Result Value Range   WBC 5.9  4.0 - 10.5 K/uL   RBC 4.37  4.22 - 5.81 MIL/uL   Hemoglobin 12.4 (*) 13.0 - 17.0 g/dL   HCT 78.2 (*) 95.6 - 21.3 %   MCV 85.6  78.0 - 100.0 fL   MCH 28.4  26.0 - 34.0  pg   MCHC 33.2  30.0 - 36.0 g/dL   RDW 08.6 (*) 57.8 - 46.9 %   Platelets 160  150 - 400 K/uL   Neutrophils Relative % 54  43 - 77 %   Neutro Abs 3.2  1.7 - 7.7 K/uL   Lymphocytes Relative 33  12 - 46 %   Lymphs Abs 2.0  0.7 - 4.0 K/uL   Monocytes Relative 9  3 - 12 %   Monocytes Absolute 0.5  0.1 - 1.0 K/uL   Eosinophils Relative 4  0 - 5 %   Eosinophils Absolute 0.2  0.0 - 0.7 K/uL   Basophils Relative 0  0 - 1 %   Basophils Absolute 0.0  0.0 - 0.1 K/uL  COMPREHENSIVE METABOLIC PANEL     Status: Abnormal   Collection Time    10/09/12  5:10 AM      Result Value Range   Sodium 137  135 - 145 mEq/L   Potassium 3.3 (*) 3.5 - 5.1 mEq/L   Chloride 106  96 - 112 mEq/L   CO2 23  19 - 32 mEq/L   Glucose, Bld 97  70 - 99 mg/dL   BUN 21  6 - 23 mg/dL   Creatinine, Ser 6.29  0.50 - 1.35 mg/dL   Calcium 8.4  8.4 - 52.8 mg/dL   Total Protein 5.5 (*) 6.0 - 8.3 g/dL   Albumin 2.3 (*) 3.5 - 5.2 g/dL   AST 16  0 -  37 U/L   ALT 15  0 - 53 U/L   Alkaline Phosphatase 44  39 - 117 U/L   Total Bilirubin 0.5  0.3 - 1.2 mg/dL   GFR calc non Af Amer 59 (*) >90 mL/min   GFR calc Af Amer 68 (*) >90 mL/min   Comment:            The eGFR has been calculated     using the CKD EPI equation.     This calculation has not been     validated in all clinical     situations.     eGFR's persistently     <90 mL/min signify     possible Chronic Kidney Disease.      Nursing note and vitals reviewed.  Constitutional: He is oriented to person, place, and time. He appears well-developed and well-nourished.  HENT:  Head: Normocephalic and atraumatic.  Eyes: Pupils are equal, round, and reactive to light.  Neck: Normal range of motion. Neck supple.  Cardiovascular: Normal rate and regular rhythm.  Pulmonary/Chest: Effort normal. No respiratory distress. He has no wheezes.  Abdominal: He exhibits distension.  Round, decreased BS.  Musculoskeletal: He exhibits no edema.  Neurological: He is alert and  oriented to person,hospital,. Needed cues for date, day of week. Tends to laugh through deficits but acknowledges them.  Mild dysarthria. Follows commands without difficulty. LUE 3- delt, 3 bicep,tricep, WE,HI are 3 to 3+/5. Marland Kitchen LLE grossly 3/5 HF, 3+ to 4- KE, 4/5 ADF. Decreased sensation bilateral feet in stocking glove distribution but otherwise non-focal. Right side grossly 4 to 5/5.  Skin: Skin is warm and dry. Rash (Right shin/foot.) noted. Chronic vascular changes Psychiatric:  Less Restless but distractible at times    Assessment/Plan: 1. Functional deficits secondary to Right subcortical stroke with left HP which require 3+ hours per day of interdisciplinary therapy in a comprehensive inpatient rehab setting. Physiatrist is providing close team supervision and 24 hour management of active medical problems listed below. Physiatrist and rehab team continue to assess barriers to discharge/monitor patient progress toward functional and medical goals. FIM: FIM - Bathing Bathing: 0: Activity did not occur  FIM - Upper Body Dressing/Undressing Upper body dressing/undressing steps patient completed: Thread/unthread right sleeve of pullover shirt/dresss;Put head through opening of pull over shirt/dress Upper body dressing/undressing: 3: Mod-Patient completed 50-74% of tasks FIM - Lower Body Dressing/Undressing Lower body dressing/undressing steps patient completed: Thread/unthread right underwear leg;Thread/unthread right pants leg Lower body dressing/undressing: 2: Max-Patient completed 25-49% of tasks  FIM - Toileting Toileting steps completed by patient: Adjust clothing prior to toileting;Performs perineal hygiene;Adjust clothing after toileting Toileting Assistive Devices: Grab bar or rail for support Toileting: 4: Steadying assist  FIM - Diplomatic Services operational officer Devices: Grab bars Toilet Transfers: 4-To toilet/BSC: Min A (steadying Pt. > 75%);4-From toilet/BSC: Min  A (steadying Pt. > 75%)  FIM - Bed/Chair Transfer Bed/Chair Transfer Assistive Devices: Arm rests Bed/Chair Transfer: 5: Supine > Sit: Supervision (verbal cues/safety issues);5: Sit > Supine: Supervision (verbal cues/safety issues)  FIM - Locomotion: Wheelchair Distance: 30' Locomotion: Wheelchair: 1: Travels less than 50 ft with minimal assistance (Pt.>75%) FIM - Locomotion: Ambulation Locomotion: Ambulation Assistive Devices: Walker - Rolling;Other (comment) (With hand grip) Ambulation/Gait Assistance: 4: Min assist Locomotion: Ambulation: 2: Travels 50 - 149 ft with minimal assistance (Pt.>75%)  Comprehension Comprehension Mode: Auditory Comprehension: 6-Follows complex conversation/direction: With extra time/assistive device  Expression Expression Mode: Verbal Expression: 5-Expresses basic 90% of the time/requires cueing < 10%  of the time.  Social Interaction Social Interaction: 6-Interacts appropriately with others with medication or extra time (anti-anxiety, antidepressant).  Problem Solving Problem Solving: 5-Solves basic problems: With no assist  Memory Memory: 6-More than reasonable amt of time  Medical Problem List and Plan:  1. DVT Prophylaxis/Anticoagulation: Pharmaceutical: Other (comment)  2. Pain Management: tylenol, gabapentin for neuropathic pain  3. Mood: team to provide egosupport. Pt shows no signs of depression on examination, however cognition and attention don't allow for a reliable assessment.  4. Neuropsych: This patient is not capable of making decisions on his own behalf.  5. Atrial Fibrillation: Monitor HR with bid checks. Continues with tachycardia with activity--likely due to deconditioned. Will continue  6. Acute on chronic renal failure: Lasix discontinued. May need to stay dry to avoid recurrent exacerbation of CHF. Low salt diet. Check daily weights.  7. Acute on chroniccombined systolic and diastolic CHF:  8. Recent PNA with question of  Aspiration PNA: completed 7/7 days antibiotic.  9. COPD: Respiratory status improving. Continue combivent bid with spiriva and low dose oral steroids 10.  Mild HypoK , recheck BMET after supplement  LOS (Days) 2 A FACE TO FACE EVALUATION WAS PERFORMED  Kialee Kham E 10/10/2012, 8:23 AM

## 2012-10-10 NOTE — Progress Notes (Signed)
Occupational Therapy Session Note  Patient Details  Name: Richard Chavez MRN: [id number] Date of Birth: 06-May-1927  Today's Date: 10/10/2012 Time: 4098-1191 Time Calculation (min): 65 min  Short Term Goals: Week 1:  OT Short Term Goal 1 (Week 1): Pt will complete UB dressing with min assist OT Short Term Goal 2 (Week 1): Pt will complete LB dressing with mod assist OT Short Term Goal 3 (Week 1): Pt will complete toilet transfer with supervision with necessary AD. OT Short Term Goal 4 (Week 1): Pt will complete walk-in shower transfer with min assist OT Short Term Goal 5 (Week 1): Pt will demonstrate use of LUE to assist in grooming tasks as gross assist level with min cues  Skilled Therapeutic Interventions/Progress Updates:    Pt seen for ADL retraining with focus on functional mobility without AD, bathing and dressing at sit <> stand level, and functional use of LUE.  Pt reported need to toilet, ambulated to toilet with min assist at hips for stability and cues to slow down.  Gathered clothing prior to shower with min assist, pt required rest break post gathering clothes.  Completed stand pivot transfer from w/c to walk-in shower with use of tub bench.  Pt completed bathing with increased time with attempts to incorporate LUE into bathing.  Pt required increased time for dressing secondary to fatigue.  Pt deep breathing post activity, O2 97% and HR 70 post activity.    Therapy Documentation Precautions:  Precautions Precautions: Fall Precaution Comments: LUE weakness, fragile skin Restrictions Weight Bearing Restrictions: No Other Position/Activity Restrictions: Support LUE General:   Vital Signs: Therapy Vitals Pulse Rate: 68 Oxygen Therapy SpO2: 97 % O2 Device: None (Room air) Pain: Pain Assessment Pain Assessment: No/denies pain  See FIM for current functional status  Therapy/Group: Individual Therapy  Leonette Monarch 10/10/2012, 11:41 AM

## 2012-10-10 NOTE — Progress Notes (Signed)
Speech Language Pathology Daily Session Note  Patient Details  Name: Richard Chavez MRN: [id number] Date of Birth: 06-Jan-1927  Today's Date: 10/10/2012 Time: 0830-0900 Time Calculation (min): 30 min  Short Term Goals: Week 1: SLP Short Term Goal 1 (Week 1): Patient will demonstrate effecient mastication of Dys.3 textures throughout a meal with Mod I use of safe swallow strategies.  Skilled Therapeutic Interventions: Skilled treatment session focused on addressing dysphagia goals.  Upon entering room patient had consumed breakfast, so SLP facilitated session with trials of Regular textrues and thin liquids with prolonged mastication due to inadequate dentition.  SLP recommends initial set up assist to get up to chair and chop meats; however then intermittent supervision with the rest of the meal.    FIM:  Comprehension Comprehension Mode: Auditory Comprehension: 6-Follows complex conversation/direction: With extra time/assistive device Expression Expression Mode: Verbal Expression: 5-Expresses complex 90% of the time/cues < 10% of the time Social Interaction Social Interaction: 6-Interacts appropriately with others with medication or extra time (anti-anxiety, antidepressant). Problem Solving Problem Solving: 5-Solves basic problems: With no assist Memory Memory: 6-More than reasonable amt of time FIM - Eating Eating Activity: 5: Supervision/cues  Pain Pain Assessment Pain Assessment: No/denies pain  Therapy/Group: Individual Therapy  Charlane Ferretti., CCC-SLP [phone]  Joe Tanney 10/10/2012, 10:31 AM

## 2012-10-10 NOTE — Plan of Care (Signed)
Problem: RH BOWEL ELIMINATION Goal: RH STG MANAGE BOWEL WITH ASSISTANCE STG Manage Bowel with Assistance. Modified independent  Outcome: Progressing Patients last BM on 10/04/12. Patient had 2 large BM's in bathroom on 10/10/12. adm

## 2012-10-10 NOTE — Progress Notes (Signed)
Physical Therapy Session Note  Patient Details  Name: Richard Chavez MRN: [id number] Date of Birth: 20-Feb-1927  Today's Date: 10/10/2012 Time: 1307-1400 and 1478-2956 Time Calculation (min): 53 min and 37 min  Short Term Goals: Week 1:  PT Short Term Goal 1 (Week 1): Pt will perform all bed<>chair trainsfers with Min A PT Short Term Goal 2 (Week 1): Pt will ambulate 150' with Min A and LRAD PT Short Term Goal 3 (Week 1): Pt will ascend/descend 2 stairs with rails and MinA  PT Short Term Goal 4 (Week 1): Pt will demonstrate S with dynamic balance during functional task  Skilled Therapeutic Interventions/Progress Updates:   First session: patient in w/c; requesting to use toilet.  Performed transfer w/c <> toilet with RW and min A with verbal cues for safety with RW in narrow spaces and when pivoting. Pt required max A for clothing doffing and donning secondary to suspenders and LUE weakness.  Pt able to perform hygiene.  Performed w/c mobility in controlled environment x 50' with mod-max A for bilat LE propulsion sequencing; patient LE fatigued quickly and reported burning and tingling in feet.  Once in gym performed stair negotiation training and LE strengthening up and down 2 stairs with UE support on 2 rails with mod A overall to maintain L hand grip on rail and verbal cues for safe sequencing.  Performed BERG balance assessment; see below for details.  Falls risk discussed with patient and importance of calling for assistance and use of RW.  Pt reports that before being in hospital he did not use RW or SPC but has them.    Second Session: patient resting in recliner; reporting significant neuropathic pain in bilat feet; RN reports pt premedicated.  Patient performed transfer squat pivot recliner > w/c <> NUstep with bilat UE support on arm rests with min A.  Performed NUstep x 10 minutes at level 5 with bilat UE and LE for strengthening, endurance + min with LE only for extensor strengthening at  level 5 resistance.  Transfer back to w/c and to recliner with min A to maintain LUE on arm rest.  Pt reporting need to have BM.  Performed bilat HHA to toilet and assist with doffing of pants; RN to assist back to recliner.    Therapy Documentation Precautions:  Precautions Precautions: Fall Precaution Comments: LUE weakness, fragile skin Restrictions Weight Bearing Restrictions: No Other Position/Activity Restrictions: Support LUE Vital Signs: Therapy Vitals Temp: 97.7 F (36.5 C) Temp src: Oral Pulse Rate: 79 Resp: 20 BP: 131/73 mmHg Patient Position, if appropriate: Sitting Oxygen Therapy SpO2: 98 % Pain: Pain Assessment Pain Assessment: 0-10 Pain Score:   4 Pain Type: Neuropathic pain Pain Location: Foot Pain Orientation: Right;Left Pain Descriptors / Indicators: Burning;Stabbing;Pins and needles Pain Onset: Gradual Pain Intervention(s): Other (Comment) (premedicated) Balance: Standardized Balance Assessment Standardized Balance Assessment: Berg Balance Test Berg Balance Test Sit to Stand: Able to stand using hands after several tries Standing Unsupported: Able to stand 2 minutes with supervision Sitting with Back Unsupported but Feet Supported on Floor or Stool: Able to sit safely and securely 2 minutes Stand to Sit: Controls descent by using hands Transfers: Needs one person to assist Standing Unsupported with Eyes Closed: Able to stand 10 seconds with supervision Standing Ubsupported with Feet Together: Needs help to attain position but able to stand for 30 seconds with feet together From Standing, Reach Forward with Outstretched Arm: Can reach confidently >25 cm (10") From Standing Position, Pick up Object  from Floor: Able to pick up shoe safely and easily From Standing Position, Turn to Look Behind Over each Shoulder: Looks behind from both sides and weight shifts well Turn 360 Degrees: Needs assistance while turning Standing Unsupported, Alternately Place Feet  on Step/Stool: Able to complete >2 steps/needs minimal assist Standing Unsupported, One Foot in Front: Able to take small step independently and hold 30 seconds Standing on One Leg: Unable to try or needs assist to prevent fall Total Score: 32 Patient demonstrates increased fall risk as noted by score of 32/56 on Berg Balance Scale.  (<36= high risk for falls, close to 100%; 37-45 significant >80%; 46-51 moderate >50%; 52-55 lower >25%)   See FIM for current functional status  Therapy/Group: Individual Therapy  Edman Circle [facility] 10/10/2012, 4:18 PM

## 2012-10-11 ENCOUNTER — Inpatient Hospital Stay (HOSPITAL_COMMUNITY): Payer: Medicare Other | Admitting: Physical Therapy

## 2012-10-11 ENCOUNTER — Inpatient Hospital Stay (HOSPITAL_COMMUNITY): Payer: Medicare Other | Admitting: Speech Pathology

## 2012-10-11 ENCOUNTER — Inpatient Hospital Stay (HOSPITAL_COMMUNITY): Payer: Medicare Other

## 2012-10-11 NOTE — Progress Notes (Signed)
Speech Language Pathology Treatment Patient Details  Name: Richard Chavez MRN: [id number] Date of Birth: 1926/08/11  Today's Date: 10/11/2012 Time:  1420- 1450      Pain Assessment Pain Score: 0-No pain   Treatment  Treatment focused on addressing dysphagia goals, with focus on diaphragmatic breathing technique and review of swallow safety strategies. Patient declined any solid texture P.O.'s at this time, but had sips of thin liquids with no overt s/s aspiration. Patient stated that his main problem with solid textures is that his dentures are broken. He also stated that he has had COPD since 1984, which causes him to get short of breath and fatigued. SLP discussed and instructed patient to complete diaphragmatic breathing, which patient return-demonstrated (with cues to increase accuracy of posture and technique). SLP also provided patient with education regarding conservation of energy and using diaphragmatic breathing when resting in between physical exercise as well in addition to using as a daily exercise to improve respiratory support and control.     Patient Active Problem List   Diagnosis Date Noted  . CVA (cerebral infarction) 10/09/2012  . Atrial fibrillation with RVR 10/03/2012  . Small bowel obstruction 10/03/2012  . Renal failure, acute on chronic 10/03/2012  . Hyperkalemia 10/03/2012  . Stroke, lacunar 10/01/2012  . Acute on chronic combined systolic and diastolic congestive heart failure, NYHA class 3 10/01/2012  . Possible reccuring Aspiration pneumonia, worsened by potential CVA 10/01/2012  . Chronic kidney disease, stage III (moderate) 09/16/2012  . Chronic combined systolic and diastolic congestive heart failure 09/15/2012  . CKD (chronic kidney disease) stage 2, GFR 60-89 ml/min 09/15/2012  . OSA (obstructive sleep apnea) 09/15/2012  . PNA (pneumonia) 05/16/2012  . Hypotension 05/16/2012  . Leukocytosis 05/16/2012  . Renal failure (ARF), acute on chronic 05/16/2012   . Dehydration 05/16/2012  . UTI (lower urinary tract infection) 07/04/2011  . CAD CABG 1998, patent grafts 06/06/11 after abn Myoview 07/04/2011  . Syncope, admitted with dehydration, hypotension, acute renalinsufficency. 07/04/2011  . Pacemaker, St Jude, implanted 2008 07/04/2011  . Cardiomyopathy, ischemic, EF 30-35% by recent cath 07/04/2011  . Edema of both legs 07/04/2011  . Hypertension 04/26/2011  . Heart attack 04/26/2011  . NSVT  04/26/2011  . Hypercholesteremia 04/26/2011  . Allergic rhinitis 04/26/2011  . COPD (chronic obstructive pulmonary disease) 04/26/2011  . GERD (gastroesophageal reflux disease) 04/26/2011  . Barrett's esophagus 04/26/2011       Elio Forget Tarrell 10/11/2012, 4:10 PM  Angela Nevin, MA, CCC-SLP [facility] Speech-Language Pathologist

## 2012-10-11 NOTE — Progress Notes (Signed)
Occupational Therapy Session Note  Patient Details  Name: Richard Chavez MRN: [id number] Date of Birth: November 22, 1926  Today's Date: 10/11/2012 Time: 1191-4782 Time Calculation (min): 45 min  Short Term Goals: Week 1:  OT Short Term Goal 1 (Week 1): Pt will complete UB dressing with min assist OT Short Term Goal 2 (Week 1): Pt will complete LB dressing with mod assist OT Short Term Goal 3 (Week 1): Pt will complete toilet transfer with supervision with necessary AD. OT Short Term Goal 4 (Week 1): Pt will complete walk-in shower transfer with min assist OT Short Term Goal 5 (Week 1): Pt will demonstrate use of LUE to assist in grooming tasks as gross assist level with min cues  Skilled Therapeutic Interventions/Progress Updates:    Pt in bed upon arrival and agreeable to engaging in washing up and dressing.  Pt stated he really wanted to shave this morning.  Pt agreed to bathing at sink since he wanted to shave and didn't think he could complete shower and shave within allotted time.  Pt required assistance with shaving with nondominant arm.  Pt completed bathing and dressing with sit<>stand from w/c at sink.  Focus on activity tolerance, dynamic standing balance, transfers, sit<>stand, and safety awareness  Therapy Documentation Precautions:  Precautions Precautions: Fall Precaution Comments: LUE weakness, fragile skin Restrictions Weight Bearing Restrictions: No Other Position/Activity Restrictions: Support LUE Pain:  Pt denies pain  See FIM for current functional status  Therapy/Group: Individual Therapy  Rich Brave 10/11/2012, 9:37 AM

## 2012-10-11 NOTE — Progress Notes (Signed)
Patient ID: Richard Chavez, male   DOB: 1926/05/26, 79 y.o.   MRN: [id number] Subjective/Complaints: 79 y.o. male with history of GERD, COPD, CAD, advanced systolic and diastolic heart failure, recent admission for PNA who was readmitted on 10/01/12 with SOB and BLE edema. He was noted to have LUE weakness with numbness and family reported confusion as well as difficulty walking. He was treated with IV diuretics for acute on chronic CHF with improvement in respiratory status and LE edema. CT head done per neurology input due to concerns of subcortical stroke. CT head without acute changes and stable mild atrophy with white matter ischemic changes. Carotid dopplers with right 40-59% ICA stenosis and L-60-79% ICA stenosis. He was changed to plavix per neurology recommendations. He reported problems with constipation and was noted to have SBO. Dr. Derrell Lolling consulted and recommended NGT to low suction. Atrial fibrillation with RVR treated with IV metoprolol and cardiology recommended changing patient to eliquis. Treated with IV vanc/ zosyn for potential aspiration PNA  No complaints, slept well Review of Systems  Gastrointestinal: Negative for nausea, vomiting and constipation.  Genitourinary: Negative for urgency.  Psychiatric/Behavioral: Negative for depression.  All other systems reviewed and are negative.     Objective: Vital Signs: Blood pressure 100/75, pulse 76, temperature 98.2 F (36.8 C), temperature source Oral, resp. rate 18, height 6\' 2"  (1.88 m), weight 103.4 kg (227 lb 15.3 oz), SpO2 95.00%. No results found. Results for orders placed during the hospital encounter of 10/08/12 (from the past 72 hour(s))  CBC WITH DIFFERENTIAL     Status: Abnormal   Collection Time    10/09/12  5:10 AM      Result Value Range   WBC 5.9  4.0 - 10.5 K/uL   RBC 4.37  4.22 - 5.81 MIL/uL   Hemoglobin 12.4 (*) 13.0 - 17.0 g/dL   HCT 08.6 (*) 57.8 - 46.9 %   MCV 85.6  78.0 - 100.0 fL   MCH 28.4  26.0 - 34.0  pg   MCHC 33.2  30.0 - 36.0 g/dL   RDW 62.9 (*) 52.8 - 41.3 %   Platelets 160  150 - 400 K/uL   Neutrophils Relative % 54  43 - 77 %   Neutro Abs 3.2  1.7 - 7.7 K/uL   Lymphocytes Relative 33  12 - 46 %   Lymphs Abs 2.0  0.7 - 4.0 K/uL   Monocytes Relative 9  3 - 12 %   Monocytes Absolute 0.5  0.1 - 1.0 K/uL   Eosinophils Relative 4  0 - 5 %   Eosinophils Absolute 0.2  0.0 - 0.7 K/uL   Basophils Relative 0  0 - 1 %   Basophils Absolute 0.0  0.0 - 0.1 K/uL  COMPREHENSIVE METABOLIC PANEL     Status: Abnormal   Collection Time    10/09/12  5:10 AM      Result Value Range   Sodium 137  135 - 145 mEq/L   Potassium 3.3 (*) 3.5 - 5.1 mEq/L   Chloride 106  96 - 112 mEq/L   CO2 23  19 - 32 mEq/L   Glucose, Bld 97  70 - 99 mg/dL   BUN 21  6 - 23 mg/dL   Creatinine, Ser 2.44  0.50 - 1.35 mg/dL   Calcium 8.4  8.4 - 01.0 mg/dL   Total Protein 5.5 (*) 6.0 - 8.3 g/dL   Albumin 2.3 (*) 3.5 - 5.2 g/dL   AST 16  0 - 37 U/L   ALT 15  0 - 53 U/L   Alkaline Phosphatase 44  39 - 117 U/L   Total Bilirubin 0.5  0.3 - 1.2 mg/dL   GFR calc non Af Amer 59 (*) >90 mL/min   GFR calc Af Amer 68 (*) >90 mL/min   Comment:            The eGFR has been calculated     using the CKD EPI equation.     This calculation has not been     validated in all clinical     situations.     eGFR's persistently     <90 mL/min signify     possible Chronic Kidney Disease.  URINE CULTURE     Status: None   Collection Time    10/09/12  7:40 PM      Result Value Range   Specimen Description URINE, RANDOM     Special Requests NONE     Culture  Setup Time 10/09/2012 21:02     Colony Count NO GROWTH     Culture NO GROWTH     Report Status 10/10/2012 FINAL        Nursing note and vitals reviewed.  Constitutional: He is oriented to person, place, and time. He appears well-developed and well-nourished.  HENT:  Head: Normocephalic and atraumatic.  Eyes: Pupils are equal, round, and reactive to light.  Neck: Normal  range of motion. Neck supple.  Cardiovascular: Normal rate and regular rhythm.  Pulmonary/Chest: Effort normal. No respiratory distress. He has no wheezes.  Abdominal: He exhibits distension.  Round, decreased BS.  Musculoskeletal: He exhibits no edema.  Neurological: He is alert and oriented to person,hospital,. Needed cues for date, day of week. Tends to laugh through deficits but acknowledges them.  Mild dysarthria. Follows commands without difficulty. LUE 3- delt, 3 bicep,tricep, WE,HI are 3 to 3+/5. Marland Kitchen LLE grossly 3/5 HF, 3+ to 4- KE, 4/5 ADF. Decreased sensation bilateral feet in stocking glove distribution but otherwise non-focal. Right side grossly 4 to 5/5.  Skin: Skin is warm and dry. Rash (Right shin/foot.) noted. Chronic vascular changes Psychiatric:  Less Restless but distractible at times    Assessment/Plan: 1. Functional deficits secondary to Right subcortical stroke with left HP which require 3+ hours per day of interdisciplinary therapy in a comprehensive inpatient rehab setting. Physiatrist is providing close team supervision and 24 hour management of active medical problems listed below. Physiatrist and rehab team continue to assess barriers to discharge/monitor patient progress toward functional and medical goals. FIM: FIM - Bathing Bathing Steps Patient Completed: Chest;Right Arm;Left Arm;Abdomen;Front perineal area;Buttocks;Right upper leg;Left upper leg;Right lower leg (including foot);Left lower leg (including foot) Bathing: 4: Steadying assist  FIM - Upper Body Dressing/Undressing Upper body dressing/undressing steps patient completed: Thread/unthread right sleeve of pullover shirt/dresss;Thread/unthread left sleeve of pullover shirt/dress;Put head through opening of pull over shirt/dress;Thread/unthread right sleeve of front closure shirt/dress Upper body dressing/undressing: 3: Mod-Patient completed 50-74% of tasks FIM - Lower Body Dressing/Undressing Lower body  dressing/undressing steps patient completed: Thread/unthread right underwear leg;Thread/unthread left underwear leg;Pull pants up/down Lower body dressing/undressing: 2: Max-Patient completed 25-49% of tasks  FIM - Toileting Toileting steps completed by patient: Performs perineal hygiene Toileting Assistive Devices: Grab bar or rail for support Toileting: 2: Max-Patient completed 1 of 3 steps  FIM - Diplomatic Services operational officer Devices: Art gallery manager Transfers: 4-To toilet/BSC: Min A (steadying Pt. > 75%);4-From toilet/BSC: Min A (steadying Pt. > 75%)  FIM - Banker Devices: Arm rests;Walker Bed/Chair Transfer: 4: Chair or W/C > Bed: Min A (steadying Pt. > 75%);4: Bed > Chair or W/C: Min A (steadying Pt. > 75%)  FIM - Locomotion: Wheelchair Distance: 50 Locomotion: Wheelchair: 2: Travels 50 - 149 ft with maximal assistance (Pt: 25 - 49%) FIM - Locomotion: Ambulation Locomotion: Ambulation Assistive Devices: Walker - Rolling;Other (comment) Ambulation/Gait Assistance: 4: Min assist Locomotion: Ambulation: 1: Travels less than 50 ft with minimal assistance (Pt.>75%)  Comprehension Comprehension Mode: Auditory Comprehension: 6-Follows complex conversation/direction: With extra time/assistive device  Expression Expression Mode: Verbal Expression: 5-Expresses complex 90% of the time/cues < 10% of the time  Social Interaction Social Interaction: 6-Interacts appropriately with others with medication or extra time (anti-anxiety, antidepressant).  Problem Solving Problem Solving: 5-Solves basic 90% of the time/requires cueing < 10% of the time  Memory Memory: 6-More than reasonable amt of time  Medical Problem List and Plan:  1. DVT Prophylaxis/Anticoagulation: Pharmaceutical: Other (comment)  2. Pain Management: tylenol, gabapentin for neuropathic pain  3. Mood: team to provide egosupport. Pt shows no signs of depression on  examination, however cognition and attention don't allow for a reliable assessment.  4. Neuropsych: This patient is not capable of making decisions on his own behalf.  5. Atrial Fibrillation: Monitor HR with bid checks. Continues with tachycardia with activity--likely due to deconditioned. Will continue  6. Acute on chronic renal failure: Lasix discontinued. May need to stay dry to avoid recurrent exacerbation of CHF. Low salt diet. Check daily weights.  7. Acute on chroniccombined systolic and diastolic CHF:  8. Recent PNA with question of Aspiration PNA: completed 7/7 days antibiotic.  9. COPD: Respiratory status improving. Continue combivent bid with spiriva and low dose oral steroids 10.  Mild HypoK , recheck BMET after supplement  LOS (Days) 3 A FACE TO FACE EVALUATION WAS PERFORMED  Jonahtan Manseau E 10/11/2012, 8:58 AM

## 2012-10-11 NOTE — Progress Notes (Signed)
Physical Therapy Note  Patient Details  Name: Richard Chavez MRN: [id number] Date of Birth: [birth year] Today's Date: 10/11/2012  1105-1150 (45 minutes) individual Pain: no complaint of pain Other: Oxygen sats (resting ) 97% RA Focus of treatment: gait training/endurance; therapeutic exercise focused on activity tolerance Treatment: Pt finishing breathing treatment; transfers stand/turn with RW SBA with vcs for brakes, tactile cues to remove legrests and safe use of AD; Nustep (LEs only)  Level 4 X 10 minutes (Oxygen sats  99% RA pulse 72 BPM); gait - 60 feet X 2 with RW + hand splint left SBA with vcs to remove hand from splint before sitting.    1300-1355 (55 minutes) group Pain: no reported pain Pt participated In PT group session focused on gait training/safety/endurance; Pt ambulates with RW + hand splint left min to close SBA (pt unable to maintain grip using splint requiring assist to maintain in splint) 80 feet X 3 . Pt also requires vcs to remove hand from splint before sitting.   Rene Gonsoulin,JIM 10/11/2012, 11:14 AM

## 2012-10-11 NOTE — Plan of Care (Signed)
Problem: RH BLADDER ELIMINATION Goal: RH STG MANAGE BLADDER WITH ASSISTANCE STG Manage Bladder With Assistance Modified independent  Outcome: Progressing Urinary incontinence at times; keep urinal in close reach and pants easy to unbutton for quick access to prevent accidents.

## 2012-10-12 ENCOUNTER — Inpatient Hospital Stay (HOSPITAL_COMMUNITY): Payer: Medicare Other | Admitting: *Deleted

## 2012-10-12 LAB — BASIC METABOLIC PANEL
BUN: 19 mg/dL (ref 6–23)
Chloride: 109 mEq/L (ref 96–112)
GFR calc Af Amer: 64 mL/min — ABNORMAL LOW (ref >90)
GFR calc non Af Amer: 55 mL/min — ABNORMAL LOW (ref >90)
Glucose, Bld: 87 mg/dL (ref 70–99)
Potassium: 4.2 mEq/L (ref 3.5–5.1)
Sodium: 139 mEq/L (ref 135–145)

## 2012-10-12 MED ORDER — FUROSEMIDE 20 MG PO TABS
20.0000 mg | ORAL_TABLET | Freq: Every day | ORAL | Status: DC
  Administered 2012-10-12 – 2012-10-15 (×4): 20 mg via ORAL
  Filled 2012-10-12 (×7): qty 1

## 2012-10-12 NOTE — Progress Notes (Signed)
Occupational Therapy Note  Patient Details  Name: JUANMIGUEL DEFELICE MRN: [id number] Date of Birth: 30-Mar-1927 Today's Date: 10/12/2012  Time:  1115- 1225  (70 minutes) Individual session Pain:  None  Engaged in functional activities at shower level.  Pt ambultated to shower with RW and moderate cues to keep RW in front of him.  Pt. Transferred to toilet and then ambualted to shower seat.  Required mod assist with transfers and balance.  Pt. Sat on shower bench and washed body using LUE (dominant side)  With frequent dropping of soap and washcloth.  Pt. Ambulated out to recliner to dry off and dress.  Dressed shorts and shirt with set up assist.  Ambulated to wc and set up for lunch.  Decreased awareness with LUE with frequent drops   Humberto Seals 10/12/2012, 12:32 PM

## 2012-10-12 NOTE — Progress Notes (Signed)
Patient ID: Richard Chavez, male   DOB: Dec 21, 1926, 79 y.o.   MRN: [id number] Subjective/Complaints: 79 y.o. male with history of GERD, COPD, CAD, advanced systolic and diastolic heart failure, recent admission for PNA who was readmitted on 10/01/12 with SOB and BLE edema. He was noted to have LUE weakness with numbness and family reported confusion as well as difficulty walking. He was treated with IV diuretics for acute on chronic CHF with improvement in respiratory status and LE edema. CT head done per neurology input due to concerns of subcortical stroke. CT head without acute changes and stable mild atrophy with white matter ischemic changes. Carotid dopplers with right 40-59% ICA stenosis and L-60-79% ICA stenosis. He was changed to plavix per neurology recommendations. He reported problems with constipation and was noted to have SBO. Dr. Derrell Lolling consulted and recommended NGT to low suction. Atrial fibrillation with RVR treated with IV metoprolol and cardiology recommended changing patient to eliquis. Treated with IV vanc/ zosyn for potential aspiration PNA  Tired after therapy yesterday Review of Systems  Gastrointestinal: Negative for nausea, vomiting and constipation.  Genitourinary: Negative for urgency.  Psychiatric/Behavioral: Negative for depression.  All other systems reviewed and are negative.     Objective: Vital Signs: Blood pressure 155/80, pulse 69, temperature 97.9 F (36.6 C), temperature source Oral, resp. rate 18, height 6\' 2"  (1.88 m), weight 103.7 kg (228 lb 9.9 oz), SpO2 97.00%. No results found. Results for orders placed during the hospital encounter of 10/08/12 (from the past 72 hour(s))  URINE CULTURE     Status: None   Collection Time    10/09/12  7:40 PM      Result Value Range   Specimen Description URINE, RANDOM     Special Requests NONE     Culture  Setup Time 10/09/2012 21:02     Colony Count NO GROWTH     Culture NO GROWTH     Report Status 10/10/2012  FINAL    BASIC METABOLIC PANEL     Status: Abnormal   Collection Time    10/12/12  7:10 AM      Result Value Range   Sodium 139  135 - 145 mEq/L   Potassium 4.2  3.5 - 5.1 mEq/L   Chloride 109  96 - 112 mEq/L   CO2 24  19 - 32 mEq/L   Glucose, Bld 87  70 - 99 mg/dL   BUN 19  6 - 23 mg/dL   Creatinine, Ser 4.09  0.50 - 1.35 mg/dL   Calcium 8.5  8.4 - 81.1 mg/dL   GFR calc non Af Amer 55 (*) >90 mL/min   GFR calc Af Amer 64 (*) >90 mL/min   Comment:            The eGFR has been calculated     using the CKD EPI equation.     This calculation has not been     validated in all clinical     situations.     eGFR's persistently     <90 mL/min signify     possible Chronic Kidney Disease.      Nursing note and vitals reviewed.  Constitutional: He is oriented to person, place, and time. He appears well-developed and well-nourished.  HENT:  Head: Normocephalic and atraumatic.  Eyes: Pupils are equal, round, and reactive to light.  Neck: Normal range of motion. Neck supple.  Cardiovascular: Normal rate and regular rhythm.  Pulmonary/Chest: Effort normal. No respiratory distress. He has no  wheezes.  Abdominal: He exhibits distension.  Round, decreased BS.  Musculoskeletal: He exhibits no edema.  Neurological: He is alert and oriented to person,hospital,. Needed cues for date, day of week. Tends to laugh through deficits but acknowledges them.  Mild dysarthria. Follows commands without difficulty. LUE 3- delt, 3 bicep,tricep, WE,HI are 3 to 3+/5. Marland Kitchen LLE grossly 3/5 HF, 3+ to 4- KE, 4/5 ADF. Decreased sensation bilateral feet in stocking glove distribution but otherwise non-focal. Right side grossly 4 to 5/5.  Skin: Skin is warm and dry. Rash (Right shin/foot.) noted. Chronic vascular changes Psychiatric:   mood and affect are appropriate   Assessment/Plan: 1. Functional deficits secondary to Right subcortical stroke with left HP which require 3+ hours per day of interdisciplinary  therapy in a comprehensive inpatient rehab setting. Physiatrist is providing close team supervision and 24 hour management of active medical problems listed below. Physiatrist and rehab team continue to assess barriers to discharge/monitor patient progress toward functional and medical goals. FIM: FIM - Bathing Bathing Steps Patient Completed: Chest;Right Arm;Left Arm;Abdomen;Front perineal area;Buttocks;Right upper leg;Left upper leg;Right lower leg (including foot);Left lower leg (including foot) Bathing: 4: Steadying assist  FIM - Upper Body Dressing/Undressing Upper body dressing/undressing steps patient completed: Thread/unthread right sleeve of pullover shirt/dresss;Thread/unthread left sleeve of pullover shirt/dress;Put head through opening of pull over shirt/dress;Pull shirt over trunk Upper body dressing/undressing: 5: Supervision: Safety issues/verbal cues FIM - Lower Body Dressing/Undressing Lower body dressing/undressing steps patient completed: Thread/unthread right pants leg;Thread/unthread left pants leg;Pull pants up/down Lower body dressing/undressing: 3: Mod-Patient completed 50-74% of tasks  FIM - Toileting Toileting steps completed by patient: Performs perineal hygiene Toileting Assistive Devices: Grab bar or rail for support Toileting: 2: Max-Patient completed 1 of 3 steps  FIM - Diplomatic Services operational officer Devices: Art gallery manager Transfers: 4-To toilet/BSC: Min A (steadying Pt. > 75%);4-From toilet/BSC: Min A (steadying Pt. > 75%)  FIM - Bed/Chair Transfer Bed/Chair Transfer Assistive Devices: Arm rests;Walker Bed/Chair Transfer: 4: Chair or W/C > Bed: Min A (steadying Pt. > 75%);4: Bed > Chair or W/C: Min A (steadying Pt. > 75%)  FIM - Locomotion: Wheelchair Distance: 50 Locomotion: Wheelchair: 2: Travels 50 - 149 ft with maximal assistance (Pt: 25 - 49%) FIM - Locomotion: Ambulation Locomotion: Ambulation Assistive Devices: Walker - Rolling;Other  (comment) Ambulation/Gait Assistance: 4: Min assist Locomotion: Ambulation: 1: Travels less than 50 ft with minimal assistance (Pt.>75%)  Comprehension Comprehension Mode: Auditory Comprehension: 6-Follows complex conversation/direction: With extra time/assistive device  Expression Expression Mode: Verbal Expression: 5-Expresses basic needs/ideas: With no assist  Social Interaction Social Interaction: 6-Interacts appropriately with others with medication or extra time (anti-anxiety, antidepressant).  Problem Solving Problem Solving: 5-Solves basic problems: With no assist  Memory Memory: 6-More than reasonable amt of time  Medical Problem List and Plan:  1. DVT Prophylaxis/Anticoagulation: Pharmaceutical: Other (comment)  2. Pain Management: tylenol, gabapentin for neuropathic pain  3. Mood: team to provide egosupport. Pt shows no signs of depression on examination, however cognition and attention don't allow for a reliable assessment.  4. Neuropsych: This patient is not capable of making decisions on his own behalf.  5. Atrial Fibrillation: Monitor HR with bid checks. Continues with tachycardia with activity--likely due to deconditioned. Will continue  6. Acute on chronic renal failure: Lasix discontinued. May need to stay dry to avoid recurrent exacerbation of CHF. Low salt diet. Check daily weights.  7. Acute on chroniccombined systolic and diastolic CHF:  8. Recent PNA with question of Aspiration PNA: completed  7/7 days antibiotic.  9. COPD: Respiratory status improving. Continue combivent bid with spiriva and low dose oral steroids 10.  Mild HypoK , recheck BMET improved after supplement  LOS (Days) 4 A FACE TO FACE EVALUATION WAS PERFORMED  KIRSTEINS,ANDREW E 10/12/2012, 10:31 AM

## 2012-10-13 ENCOUNTER — Inpatient Hospital Stay (HOSPITAL_COMMUNITY): Payer: Medicare Other | Admitting: Speech Pathology

## 2012-10-13 ENCOUNTER — Inpatient Hospital Stay (HOSPITAL_COMMUNITY): Payer: Medicare Other | Admitting: Physical Therapy

## 2012-10-13 ENCOUNTER — Inpatient Hospital Stay (HOSPITAL_COMMUNITY): Payer: Medicare Other | Admitting: Occupational Therapy

## 2012-10-13 DIAGNOSIS — I4891 Unspecified atrial fibrillation: Secondary | ICD-10-CM

## 2012-10-13 DIAGNOSIS — I6340 Cerebral infarction due to embolism of unspecified cerebral artery: Secondary | ICD-10-CM

## 2012-10-13 LAB — BASIC METABOLIC PANEL
BUN: 21 mg/dL (ref 6–23)
CO2: 25 mEq/L (ref 19–32)
Chloride: 105 mEq/L (ref 96–112)
Creatinine, Ser: 1.21 mg/dL (ref 0.50–1.35)
GFR calc Af Amer: 62 mL/min — ABNORMAL LOW (ref >90)
Potassium: 4.1 mEq/L (ref 3.5–5.1)

## 2012-10-13 NOTE — Progress Notes (Signed)
Physical Therapy Session Note  Patient Details  Name: Richard Chavez MRN: [id number] Date of Birth: 05-Dec-1926  Today's Date: 10/13/2012 Time: 4098-1191 Time Calculation (min): 39 min  Short Term Goals: Week 1:  PT Short Term Goal 1 (Week 1): Pt will perform all bed<>chair trainsfers with Min A PT Short Term Goal 2 (Week 1): Pt will ambulate 150' with Min A and LRAD PT Short Term Goal 3 (Week 1): Pt will ascend/descend 2 stairs with rails and MinA  PT Short Term Goal 4 (Week 1): Pt will demonstrate S with dynamic balance during functional task  Skilled Therapeutic Interventions/Progress Updates:   Pt resting in w/c; assisted patient with donning pj pants in chair with focus on attention and sustained activation of LUE to grip pants and assist with donning; also donned shoes in w/c.  In gym performed gait training with SPC secondary to patient having increased difficulty gripping RW even with addition of hand orthosis and decreased safety with RW and sequencing of sit <> stand with hand orthosis.  Performed gait x 50' in controlled environment with SPC and min A with changes in direction to L; verbal cues needed for increased step/stride length bilaterally and increased foot clearance secondary to shuffling gait.  Continued gait training with SPC x 25' x 4 reps with focus on safe negotiation weaving L and R around obstacles, R and L lateral stepping, retro stepping and stepping over low obstacles with min A and verbal cues for full step and stride length and foot clearance while weaving around obstacles/L and R turns.  Performed functional task of picking up cones from floor with RUE for attention to RUE and sustained activation while gripping cones.  Pt given multiple rest breaks.  Returned to room in w/c.  Patient to rest before OT.  Therapy Documentation Precautions:  Precautions Precautions: Fall Precaution Comments: LUE weakness, fragile skin Restrictions Weight Bearing Restrictions:  No Other Position/Activity Restrictions: Support LUE Vital Signs: Therapy Vitals Pulse Rate: 68 BP: 134/64 mmHg Patient Position, if appropriate: Sitting Pain: Pain Assessment Pain Assessment: No/denies pain  See FIM for current functional status  Therapy/Group: Individual Therapy  Edman Circle [facility] 10/13/2012, 11:17 AM

## 2012-10-13 NOTE — Progress Notes (Signed)
Patient ID: Richard Chavez, male   DOB: Sep 17, 1926, 79 y.o.   MRN: [id number] Subjective/Complaints: 79 y.o. male with history of GERD, COPD, CAD, advanced systolic and diastolic heart failure, recent admission for PNA who was readmitted on 10/01/12 with SOB and BLE edema. He was noted to have LUE weakness with numbness and family reported confusion as well as difficulty walking. He was treated with IV diuretics for acute on chronic CHF with improvement in respiratory status and LE edema. CT head done per neurology input due to concerns of subcortical stroke. CT head without acute changes and stable mild atrophy with white matter ischemic changes. Carotid dopplers with right 40-59% ICA stenosis and L-60-79% ICA stenosis. He was changed to plavix per neurology recommendations. He reported problems with constipation and was noted to have SBO. Dr. Derrell Lolling consulted and recommended NGT to low suction. Atrial fibrillation with RVR treated with IV metoprolol and cardiology recommended changing patient to eliquis. Treated with IV vanc/ zosyn for potential aspiration PNA  Tired after therapy yesterday Review of Systems  Gastrointestinal: Negative for nausea, vomiting and constipation.  Genitourinary: Negative for urgency.  Psychiatric/Behavioral: Negative for depression.  All other systems reviewed and are negative.     Objective: Vital Signs: Blood pressure 134/64, pulse 78, temperature 97.4 F (36.3 C), temperature source Oral, resp. rate 18, height 6\' 2"  (1.88 m), weight 102.876 kg (226 lb 12.8 oz), SpO2 96.00%. No results found. Results for orders placed during the hospital encounter of 10/08/12 (from the past 72 hour(s))  BASIC METABOLIC PANEL     Status: Abnormal   Collection Time    10/12/12  7:10 AM      Result Value Range   Sodium 139  135 - 145 mEq/L   Potassium 4.2  3.5 - 5.1 mEq/L   Chloride 109  96 - 112 mEq/L   CO2 24  19 - 32 mEq/L   Glucose, Bld 87  70 - 99 mg/dL   BUN 19  6 - 23  mg/dL   Creatinine, Ser 4.09  0.50 - 1.35 mg/dL   Calcium 8.5  8.4 - 81.1 mg/dL   GFR calc non Af Amer 55 (*) >90 mL/min   GFR calc Af Amer 64 (*) >90 mL/min   Comment:            The eGFR has been calculated     using the CKD EPI equation.     This calculation has not been     validated in all clinical     situations.     eGFR's persistently     <90 mL/min signify     possible Chronic Kidney Disease.  BASIC METABOLIC PANEL     Status: Abnormal   Collection Time    10/13/12  5:29 AM      Result Value Range   Sodium 138  135 - 145 mEq/L   Potassium 4.1  3.5 - 5.1 mEq/L   Chloride 105  96 - 112 mEq/L   CO2 25  19 - 32 mEq/L   Glucose, Bld 85  70 - 99 mg/dL   BUN 21  6 - 23 mg/dL   Creatinine, Ser 9.14  0.50 - 1.35 mg/dL   Calcium 8.5  8.4 - 78.2 mg/dL   GFR calc non Af Amer 54 (*) >90 mL/min   GFR calc Af Amer 62 (*) >90 mL/min   Comment:            The eGFR has been calculated  using the CKD EPI equation.     This calculation has not been     validated in all clinical     situations.     eGFR's persistently     <90 mL/min signify     possible Chronic Kidney Disease.      Nursing note and vitals reviewed.  Constitutional: He is oriented to person, place, and time. He appears well-developed and well-nourished.  HENT:  Head: Normocephalic and atraumatic.  Eyes: Pupils are equal, round, and reactive to light.  Neck: Normal range of motion. Neck supple.  Cardiovascular: Normal rate and regular rhythm.  Pulmonary/Chest: Effort normal. No respiratory distress. He has no wheezes.  Abdominal: He exhibits distension.  Round, decreased BS.  Musculoskeletal: He exhibits no edema.  Neurological: He is alert and oriented to person,hospital,. Needed cues for date, day of week. Tends to laugh through deficits but acknowledges them.  Mild dysarthria. Follows commands without difficulty. LUE 3- delt, 3 bicep,tricep, WE,HI are 3 to 3+/5. Marland Kitchen LLE grossly 3/5 HF, 3+ to 4- KE, 4/5 ADF.  Decreased sensation bilateral feet in stocking glove distribution but otherwise non-focal. Right side grossly 4 to 5/5.  Skin: Skin is warm and dry. Rash (Right shin/foot.) noted. Chronic vascular changes Psychiatric:   mood and affect are appropriate   Assessment/Plan: 1. Functional deficits secondary to Right subcortical stroke with left HP which require 3+ hours per day of interdisciplinary therapy in a comprehensive inpatient rehab setting. Physiatrist is providing close team supervision and 24 hour management of active medical problems listed below. Physiatrist and rehab team continue to assess barriers to discharge/monitor patient progress toward functional and medical goals. FIM: FIM - Bathing Bathing Steps Patient Completed: Chest;Right Arm;Left Arm;Abdomen;Front perineal area;Buttocks;Right upper leg;Left upper leg;Right lower leg (including foot);Left lower leg (including foot) Bathing: 5: Set-up assist to: Adjust water temp  FIM - Upper Body Dressing/Undressing Upper body dressing/undressing steps patient completed: Thread/unthread right sleeve of pullover shirt/dresss;Thread/unthread left sleeve of pullover shirt/dress;Put head through opening of pull over shirt/dress;Pull shirt over trunk Upper body dressing/undressing: 5: Supervision: Safety issues/verbal cues FIM - Lower Body Dressing/Undressing Lower body dressing/undressing steps patient completed: Thread/unthread right pants leg;Thread/unthread left pants leg;Pull pants up/down Lower body dressing/undressing: 3: Mod-Patient completed 50-74% of tasks  FIM - Toileting Toileting steps completed by patient: Adjust clothing prior to toileting;Performs perineal hygiene Toileting Assistive Devices: Grab bar or rail for support Toileting: 3: Mod-Patient completed 2 of 3 steps  FIM - Diplomatic Services operational officer Devices: Art gallery manager Transfers: 4-To toilet/BSC: Min A (steadying Pt. > 75%);4-From toilet/BSC: Min A  (steadying Pt. > 75%)  FIM - Bed/Chair Transfer Bed/Chair Transfer Assistive Devices: Arm rests;Walker Bed/Chair Transfer: 4: Chair or W/C > Bed: Min A (steadying Pt. > 75%);4: Bed > Chair or W/C: Min A (steadying Pt. > 75%)  FIM - Locomotion: Wheelchair Distance: 50 Locomotion: Wheelchair: 2: Travels 50 - 149 ft with maximal assistance (Pt: 25 - 49%) FIM - Locomotion: Ambulation Locomotion: Ambulation Assistive Devices: Walker - Rolling;Other (comment) Ambulation/Gait Assistance: 4: Min assist Locomotion: Ambulation: 1: Travels less than 50 ft with minimal assistance (Pt.>75%)  Comprehension Comprehension Mode: Auditory Comprehension: 6-Follows complex conversation/direction: With extra time/assistive device  Expression Expression Mode: Verbal Expression: 6-Expresses complex ideas: With extra time/assistive device  Social Interaction Social Interaction: 6-Interacts appropriately with others with medication or extra time (anti-anxiety, antidepressant).  Problem Solving Problem Solving: 5-Solves basic problems: With no assist  Memory Memory: 6-More than reasonable amt of time  Medical  Problem List and Plan:  1. DVT Prophylaxis/Anticoagulation: Pharmaceutical: Other (comment)  2. Pain Management: tylenol, gabapentin for neuropathic pain  3. Mood: team to provide egosupport. Pt shows no signs of depression on examination, however cognition and attention don't allow for a reliable assessment.  4. Neuropsych: This patient is not capable of making decisions on his own behalf.  5. Atrial Fibrillation: Monitor HR with bid checks. Continues with tachycardia with activity--likely due to deconditioned. Will continue  6. Acute on chronic renal failure: Lasix discontinued. May need to stay dry to avoid recurrent exacerbation of CHF. Low salt diet. Check daily weights. Back on low dose lasix   7. Recent PNA resolved 8. COPD: Respiratory status improving. Continue combivent bid with spiriva  and low dose oral steroids 9.  Mild HypoK , recheck BMET improved after supplement, now on lasix cont to monitor  LOS (Days) 5 A FACE TO FACE EVALUATION WAS PERFORMED  Fallyn Munnerlyn E 10/13/2012, 9:21 AM

## 2012-10-13 NOTE — Progress Notes (Signed)
Occupational Therapy Session Note  Patient Details  Name: Richard Chavez MRN: [id number] Date of Birth: 1926-05-14  Today's Date: 10/13/2012 Time: 1116-1150 and 1333-1430 Time Calculation (min): 34 min and 57 min  Short Term Goals: Week 1:  OT Short Term Goal 1 (Week 1): Pt will complete UB dressing with min assist OT Short Term Goal 2 (Week 1): Pt will complete LB dressing with mod assist OT Short Term Goal 3 (Week 1): Pt will complete toilet transfer with supervision with necessary AD. OT Short Term Goal 4 (Week 1): Pt will complete walk-in shower transfer with min assist OT Short Term Goal 5 (Week 1): Pt will demonstrate use of LUE to assist in grooming tasks as gross assist level with min cues  Skilled Therapeutic Interventions/Progress Updates:    1) Pt seen for ADL retraining with focus on transfers, activity tolerance, standing balance, and functional use of LUE.  Pt reports not needing to shower this session, but requesting to shave.  Pt required increased time with shaving secondary to shaving with nondominant RUE.  Stand pivot transfer from w/c to toilet with close supervision with use of grab bar.  Pt required min assist transfer back to w/c secondary to slight impulsivity due to visitors arriving.  Pt requested to visit with visitors, therefore missed 11 mins.  2) Pt seen for 1:1 OT with focus on functional mobility with SPC and use of LUE in gross motor and [facility] tasks.  Verbal cues for foot placement with ambulation as pt tends to walk with slow shuffling gait, encouragement provided to lift up foot and take larger steps.  Engaged in table top activity with focus on functional use of LUE with completing pipe tree activity.  Pt with decreased motor control with grasping pvc pipes and decreased ability to manipulate items in hand. Pt quick to give up and use Rt hand.  Encouraged bimanual activities as pt with decreased use of LUE in grasping items.  Pt reports tingling in Lt hand with use.   Ambulated to toilet with close supervision and use of SPC.  Pt completed toileting with supervision.  Attempted to use LUE to utilize soap dispenser but with decreased strength compensated with RUE.  Therapy Documentation Precautions:  Precautions Precautions: Fall Precaution Comments: LUE weakness, fragile skin Restrictions Weight Bearing Restrictions: No Other Position/Activity Restrictions: Support LUE General: General Amount of Missed OT Time (min): 11 Minutes Vital Signs: Therapy Vitals Pulse Rate: 68 Patient Position, if appropriate: Sitting Pain: Pain Assessment Pain Assessment: No/denies pain  See FIM for current functional status  Therapy/Group: Individual Therapy  Leonette Monarch 10/13/2012, 12:19 PM

## 2012-10-13 NOTE — Progress Notes (Signed)
Speech Language Pathology Daily Session Note & Discharge Summary   Patient Details  Name: Richard Chavez MRN: [id number] Date of Birth: 1926/05/15  Today's Date: 10/13/2012 Time: 0830-0910 Time Calculation (min): 40 min  Short Term Goals: Week 1: SLP Short Term Goal 1 (Week 1): Patient will demonstrate effecient mastication of Dys.3 textures throughout a meal with Mod I use of safe swallow strategies. SLP Short Term Goal 1 - Progress (Week 1): Met SLP Short Term Goal 2 (Week 1): Patient will perform diaphragmatic breathing exercises with Mod I use of incentive spirometer  SLP Short Term Goal 2 - Progress (Week 1): Met  Skilled Therapeutic Interventions: SKilled treatment session focused on addressing dysphagia goals.  Patient was finishing up breakfast of Dys.3 textures and thin liuqids via straw with no observed or reported overt s/s of aspiration.  SLP obserevd no pocketing, as a result, following set-up patient is Mod I with carryover of safe swallow strategies.  Patient also returned demonstration of diaphragmatic breathing Mod I.  SLP educated patient regarding follow up recommendations for repairing dentures and taking medication whole in puree.  Goals met recommend discharge.   FIM:  Comprehension Comprehension Mode: Auditory Comprehension: 6-Follows complex conversation/direction: With extra time/assistive device Expression Expression Mode: Verbal Expression: 6-Expresses complex ideas: With extra time/assistive device Social Interaction Social Interaction: 6-Interacts appropriately with others with medication or extra time (anti-anxiety, antidepressant). Problem Solving Problem Solving: 5-Solves basic problems: With no assist Memory Memory: 6-More than reasonable amt of time FIM - Eating Eating Activity: 6: Modified consistency diet: (comment);5: Set-up assist for cut food (Dys.3 textures)  Pain Pain Assessment Pain Assessment: No/denies pain  Therapy/Group: Individual  Therapy   Speech Language Pathology Discharge Summary  Patient Details  Name: [name] MRN: [id number] Date of Birth: 10-08-1926  Today's Date: 10/13/2012  Patient has met 3 of 3 long term goals.  Patient to discharge at overall Modified Independent (after set-p) level.  Reasons goals not met: n/a   Clinical Impression/Discharge Summary: Patient met 3 out of 3 long term goals this reporting period due to gains in swallow function.  Patient's diet was advanced from Dys.2 textures to Dys.3 textures and patient is demonstrating effective mastication after set-up.  Due to broken dentetion tha cannot be repaired until after discharge it is recommended that this patient be discharged from SLP services.  It is suspected that following denture repiar patietn will be able to resume previous diet.     Care Partner:  Caregiver Able to Provide Assistance: Yes  Type of Caregiver Assistance: Physical;Cognitive  Recommendation:  24 hour supervision/assistance  Rationale for SLP Follow Up:  (n/a)   Equipment:     Reasons for discharge: Treatment goals met   Patient/Family Agrees with Progress Made and Goals Achieved: Yes   See FIM for current functional status  Charlane Ferretti., CCC-SLP [phone]  Joeann Steppe 10/13/2012, 9:13 AM

## 2012-10-14 ENCOUNTER — Inpatient Hospital Stay (HOSPITAL_COMMUNITY): Payer: Medicare Other | Admitting: Physical Therapy

## 2012-10-14 ENCOUNTER — Inpatient Hospital Stay (HOSPITAL_COMMUNITY): Payer: Medicare Other | Admitting: Occupational Therapy

## 2012-10-14 NOTE — Progress Notes (Signed)
Occupational Therapy Session Note  Patient Details  Name: Richard Chavez MRN: [id number] Date of Birth: May 29, 1926  Today's Date: 10/14/2012 Time: 0732-0830 and 1330-1400 Time Calculation (min): 58 min and 30 min  Short Term Goals: Week 1:  OT Short Term Goal 1 (Week 1): Pt will complete UB dressing with min assist OT Short Term Goal 2 (Week 1): Pt will complete LB dressing with mod assist OT Short Term Goal 3 (Week 1): Pt will complete toilet transfer with supervision with necessary AD. OT Short Term Goal 4 (Week 1): Pt will complete walk-in shower transfer with min assist OT Short Term Goal 5 (Week 1): Pt will demonstrate use of LUE to assist in grooming tasks as gross assist level with min cues  Skilled Therapeutic Interventions/Progress Updates:    1) Pt seen for ADL retraining with focus on functional mobility with SPC, transfers, and completing self-care tasks.  Pt ambulated with SPC to gather clothing.  Performed toilet transfer and walk-in shower transfer with supervision.  Pt completed bathing at seated level except standing to wash buttocks at overall supervision.  Pt completed dressing with increased time and assist to tie shoes.    2) Pt seen for 1:1 OT with focus on functional mobility and AE to assist with LB dressing.  Pt having just transferred to toilet with nurse tech upon arrival.  Supervision hygiene and clothing management.  Ambulated with SPC to therapy gym with close supervision.  Pt required min cues for step length and encouragement to not drag feet during ambulation.  Provided pt with shoe buttons to assist in fastening shoes as currently unable to tie them due decreased activity tolerance and LUE weakness (especially after showering and dressing).  Pt return demonstrated use of shoe buttons and reports they help him a lot.  Therapy Documentation Precautions:  Precautions Precautions: Fall Precaution Comments: LUE weakness, fragile skin Restrictions Weight Bearing  Restrictions: No Other Position/Activity Restrictions: Support LUE General:   Vital Signs: Therapy Vitals Temp: 97.9 F (36.6 C) Temp src: Oral Pulse Rate: 64 Resp: 20 BP: 124/80 mmHg Patient Position, if appropriate: Lying Oxygen Therapy SpO2: 96 % O2 Device: None (Room air) Pain:  Pt with no c/o pain this session.  See FIM for current functional status  Therapy/Group: Individual Therapy  Leonette Monarch 10/14/2012, 8:29 AM

## 2012-10-14 NOTE — Progress Notes (Signed)
Social Work Patient ID: Richard Chavez, male   DOB: [birth year], 79 y.o.   MRN: [id number] Met with pt to answer questions regarding his insurance coverage and papers he had in his room.  The papers were his copies of the AOB, and admission form. Explained this and pt was comfortable with and just wanted to make sure we had what we needed to bill his insurances once he left.  He is aware he may be going  Friday but will know for sure after team conference tomorrow.  He is looking forward to seeing his little dog when he gets home.  Will work on discharge plans for Friday.

## 2012-10-14 NOTE — Progress Notes (Signed)
Physical Therapy Session Note  Patient Details  Name: Richard Chavez MRN: [id number] Date of Birth: 1926-12-10  Today's Date: 10/14/2012 Time: 7829-5621 and 3086-5784 Time Calculation (min): 54 min and 28 min (missed 17 minutes pm PT session secondary to significant fatigue and bradycardia with irregular rhythm)  Short Term Goals: Week 1:  PT Short Term Goal 1 (Week 1): Pt will perform all bed<>chair trainsfers with Min A PT Short Term Goal 2 (Week 1): Pt will ambulate 150' with Min A and LRAD PT Short Term Goal 3 (Week 1): Pt will ascend/descend 2 stairs with rails and MinA  PT Short Term Goal 4 (Week 1): Pt will demonstrate S with dynamic balance during functional task  Skilled Therapeutic Interventions/Progress Updates:   Pt already up in w/c.  Pt requesting to use toilet.  Performed transfer to toilet with [facility] with supervision and performed all toileting tasks with supervision.  Performed gait in controlled environment x 100' x 2 with SPC and supervision with one episode of LOB with mod A to correct; still presents with shuffling gait and decreased bilat step and stride length.  Discussed transportation options after D/C; pt reports he was driving [employer] and that son does not have driver's license; pt also very upset that his license may be restricted; deferred conversation to MD.  Performed simulated car transfer training stand pivot with SPC into passenger and driver's side.  Performed stair negotiation training with [facility] for home entry/exit: performed up and down 2-3 stairs x 3-4 reps with focus on problem solving where SPC should be while holding rails.  Pt places SPC inside house and props it on the wall while he holds rails and holds SPC + rail while descending to exit house.  Performed with min A during step to and alternating sequence.  Continue gait training with SPC with lateral and retro stepping x 25' each direction with min A and manual facilitation for trunk rotation and weight shifting  and verbal cues for full step length, foot clearance and weight shifting laterally and anterior/posterior.  Required multiple rest breaks secondary to fatigue.      PM session:  Pt extremely fatigued this pm but willing to participate to tolerance.  Pt did not feel he was strong enough to ambulate to gym and requested to use w/c.  Required mod A for safe pivot recliner <> w/c and w/c <> Nustep secondary to LE fatigue.  Performed Nustep at level 4 x 8 minutes with bilat UE and LE for endurance and strength training.  After 8 minutes HR: 50 bpm and irregular.  Pt continued to report significant fatigue.  Returned to room and to recliner.  RN notified of low HR and extreme fatigue.    Therapy Documentation Precautions:  Precautions Precautions: Fall Precaution Comments: LUE weakness, fragile skin Restrictions Weight Bearing Restrictions: No Other Position/Activity Restrictions: Support LUE Vital Signs: Therapy Vitals Temp: 97.9 F (36.6 C) Temp src: Oral Pulse Rate: 72 Resp: 20 BP: 124/80 mmHg Patient Position, if appropriate: Sitting Oxygen Therapy SpO2: 96 % O2 Device: None (Room air) Pain: Pain Assessment Pain Assessment: No/denies pain  See FIM for current functional status  Therapy/Group: Individual Therapy  Edman Circle [facility] 10/14/2012, 10:01 AM

## 2012-10-14 NOTE — Progress Notes (Signed)
Patient ID: Richard Chavez, male   DOB: 02/11/1927, 79 y.o.   MRN: [id number] Subjective/Complaints: 79 y.o. male with history of GERD, COPD, CAD, advanced systolic and diastolic heart failure, recent admission for PNA who was readmitted on 10/01/12 with SOB and BLE edema. He was noted to have LUE weakness with numbness and family reported confusion as well as difficulty walking. He was treated with IV diuretics for acute on chronic CHF with improvement in respiratory status and LE edema. CT head done per neurology input due to concerns of subcortical stroke. CT head without acute changes and stable mild atrophy with white matter ischemic changes. Carotid dopplers with right 40-59% ICA stenosis and L-60-79% ICA stenosis. He was changed to plavix per neurology recommendations. He reported problems with constipation and was noted to have SBO. Dr. Derrell Lolling consulted and recommended NGT to low suction. Atrial fibrillation with RVR treated with IV metoprolol and cardiology recommended changing patient to eliquis. Treated with IV vanc/ zosyn for potential aspiration PNA  No C/os   Review of Systems  Gastrointestinal: Negative for nausea, vomiting and constipation.  Genitourinary: Negative for urgency.  Psychiatric/Behavioral: Negative for depression.  All other systems reviewed and are negative.     Objective: Vital Signs: Blood pressure 124/80, pulse 72, temperature 97.9 F (36.6 C), temperature source Oral, resp. rate 20, height 6\' 2"  (1.88 m), weight 100.9 kg (222 lb 7.1 oz), SpO2 96.00%. No results found. Results for orders placed during the hospital encounter of 10/08/12 (from the past 72 hour(s))  BASIC METABOLIC PANEL     Status: Abnormal   Collection Time    10/12/12  7:10 AM      Result Value Range   Sodium 139  135 - 145 mEq/L   Potassium 4.2  3.5 - 5.1 mEq/L   Chloride 109  96 - 112 mEq/L   CO2 24  19 - 32 mEq/L   Glucose, Bld 87  70 - 99 mg/dL   BUN 19  6 - 23 mg/dL   Creatinine, Ser  4.09  0.50 - 1.35 mg/dL   Calcium 8.5  8.4 - 81.1 mg/dL   GFR calc non Af Amer 55 (*) >90 mL/min   GFR calc Af Amer 64 (*) >90 mL/min   Comment:            The eGFR has been calculated     using the CKD EPI equation.     This calculation has not been     validated in all clinical     situations.     eGFR's persistently     <90 mL/min signify     possible Chronic Kidney Disease.  BASIC METABOLIC PANEL     Status: Abnormal   Collection Time    10/13/12  5:29 AM      Result Value Range   Sodium 138  135 - 145 mEq/L   Potassium 4.1  3.5 - 5.1 mEq/L   Chloride 105  96 - 112 mEq/L   CO2 25  19 - 32 mEq/L   Glucose, Bld 85  70 - 99 mg/dL   BUN 21  6 - 23 mg/dL   Creatinine, Ser 9.14  0.50 - 1.35 mg/dL   Calcium 8.5  8.4 - 78.2 mg/dL   GFR calc non Af Amer 54 (*) >90 mL/min   GFR calc Af Amer 62 (*) >90 mL/min   Comment:            The eGFR has been calculated  using the CKD EPI equation.     This calculation has not been     validated in all clinical     situations.     eGFR's persistently     <90 mL/min signify     possible Chronic Kidney Disease.      Nursing note and vitals reviewed.  Constitutional: He is oriented to person, place, and time. He appears well-developed and well-nourished.  HENT:  Head: Normocephalic and atraumatic.  Eyes: Pupils are equal, round, and reactive to light.  Neck: Normal range of motion. Neck supple.  Cardiovascular: Normal rate and regular rhythm.  Pulmonary/Chest: Effort normal. No respiratory distress. He has no wheezes.  Abdominal: He exhibits distension.  Round, decreased BS.  Musculoskeletal: He exhibits no edema.  Neurological: He is alert and oriented to person,hospital,. Needed cues for date, day of week. Tends to laugh through deficits but acknowledges them.  Mild dysarthria. Follows commands without difficulty. LUE 3- delt, 3 bicep,tricep, WE,HI are 3 to 3+/5. Marland Kitchen LLE grossly 3/5 HF, 3+ to 4- KE, 4/5 ADF. Decreased sensation  bilateral feet in stocking glove distribution but otherwise non-focal. Right side grossly 4 to 5/5.  Skin: Skin is warm and dry. noted. Chronic vascular changes Psychiatric:   mood and affect are appropriate   Assessment/Plan: 1. Functional deficits secondary to Right subcortical stroke with left HP which require 3+ hours per day of interdisciplinary therapy in a comprehensive inpatient rehab setting. Physiatrist is providing close team supervision and 24 hour management of active medical problems listed below. Physiatrist and rehab team continue to assess barriers to discharge/monitor patient progress toward functional and medical goals. FIM: FIM - Bathing Bathing Steps Patient Completed: Chest;Right Arm;Left Arm;Abdomen;Front perineal area;Buttocks;Right upper leg;Left upper leg;Right lower leg (including foot);Left lower leg (including foot) Bathing: 5: Supervision: Safety issues/verbal cues  FIM - Upper Body Dressing/Undressing Upper body dressing/undressing steps patient completed: Put head through opening of pull over shirt/dress;Thread/unthread left sleeve of pullover shirt/dress;Thread/unthread right sleeve of pullover shirt/dresss Upper body dressing/undressing: 4: Min-Patient completed 75 plus % of tasks FIM - Lower Body Dressing/Undressing Lower body dressing/undressing steps patient completed: Thread/unthread right underwear leg;Thread/unthread left underwear leg;Pull underwear up/down;Thread/unthread right pants leg;Thread/unthread left pants leg;Pull pants up/down;Don/Doff right shoe;Don/Doff left shoe Lower body dressing/undressing: 4: Min-Patient completed 75 plus % of tasks  FIM - Toileting Toileting steps completed by patient: Adjust clothing prior to toileting;Performs perineal hygiene;Adjust clothing after toileting Toileting Assistive Devices: Grab bar or rail for support Toileting: 5: Supervision: Safety issues/verbal cues  FIM - Scientist, research (physical sciences) Devices: Occupational hygienist Transfers: 5-To toilet/BSC: Supervision (verbal cues/safety issues);5-From toilet/BSC: Supervision (verbal cues/safety issues)  FIM - Banker Devices: Teacher, music: 5: Chair or W/C > Bed: Supervision (verbal cues/safety issues);5: Bed > Chair or W/C: Supervision (verbal cues/safety issues)  FIM - Locomotion: Wheelchair Distance: 50 Locomotion: Wheelchair: 1: Total Assistance/staff pushes wheelchair (Pt<25%) FIM - Locomotion: Ambulation Locomotion: Ambulation Assistive Devices: Emergency planning/management officer Ambulation/Gait Assistance: 5: Supervision Locomotion: Ambulation: 5: Travels 150 ft or more with supervision/safety issues  Comprehension Comprehension Mode: Auditory Comprehension: 6-Follows complex conversation/direction: With extra time/assistive device  Expression Expression Mode: Verbal Expression: 6-Expresses complex ideas: With extra time/assistive device  Social Interaction Social Interaction: 6-Interacts appropriately with others with medication or extra time (anti-anxiety, antidepressant).  Problem Solving Problem Solving: 5-Solves basic problems: With no assist  Memory Memory: 6-More than reasonable amt of time  Medical Problem List and Plan:  1. DVT Prophylaxis/Anticoagulation: Pharmaceutical:  Other (comment)  2. Pain Management: tylenol, gabapentin for neuropathic pain  3. Mood: team to provide egosupport. Pt shows no signs of depression on examination, however cognition and attention don't allow for a reliable assessment.  4. Neuropsych: This patient is not capable of making decisions on his own behalf.  5. Atrial Fibrillation: Monitor HR with bid checks. Continues with tachycardia with activity--likely due to deconditioned. Will continue  6. Acute on chronic renal failure: Lasix discontinued. May need to stay dry to avoid recurrent exacerbation of CHF. Low salt diet. Check daily weights. Back  on low dose lasix   7. Recent PNA resolved 8. COPD: Respiratory status improving. Continue combivent bid with spiriva and low dose oral steroids 9.  Mild HypoK , recheck BMET improved after supplement, now on lasix cont to monitor  LOS (Days) 6 A FACE TO FACE EVALUATION WAS PERFORMED  KIRSTEINS,ANDREW E 10/14/2012, 10:04 AM

## 2012-10-15 ENCOUNTER — Inpatient Hospital Stay (HOSPITAL_COMMUNITY): Payer: Medicare Other

## 2012-10-15 ENCOUNTER — Inpatient Hospital Stay (HOSPITAL_COMMUNITY): Payer: Medicare Other | Admitting: Physical Therapy

## 2012-10-15 ENCOUNTER — Inpatient Hospital Stay (HOSPITAL_COMMUNITY): Payer: Medicare Other | Admitting: Occupational Therapy

## 2012-10-15 NOTE — Progress Notes (Signed)
Occupational Therapy Note  Patient Details  Name: Richard Chavez MRN: [id number] Date of Birth: 26-Mar-1927 Today's Date: 10/15/2012  Time: 7:30-8:30am ( ) Pt seen for 1:1 OT session with focus on transfers, standing balance/endurance during BADL's. Pt doesn't report any pain today, although mentioned he felt as if therapists were "pushing" him too much. Explained to pt reasoning behind therapy and that we're trying to get him as well as possible before he goes home with pt agreeing. Pt bathed at shower level, sitting for most of shower, standing at GB to wash perineal and buttock area. Pt dressed sitting EOB and completed grooming at sink in standing. Max A to donn compression hose. Intermittent verbal cues throughout session for safety, to use SPC any time when walking and to sit during LB clothing management. Pt states that his only concern upon going home is if he will be able to drive. Told pt he would have to f/u with his MD after discharge and follow their recommendations. Ended session with pt sitting in recliner to eat breakfast. Instructed pt to raise legs up when finished eating to decrease swelling in bil feet. Call bell left within reach.   Richard Chavez [name] 10/15/2012, 9:19 AM

## 2012-10-15 NOTE — Progress Notes (Signed)
Social Work Patient ID: Richard Chavez, male   DOB: March 26, 1927, 79 y.o.   MRN: [id number]  Amada Jupiter, LCSW Social Worker Signed  Patient Care Conference Service date: 10/15/2012 4:02 PM  Inpatient RehabilitationTeam Conference and Plan of Care Update Date: 10/15/2012   Time: 10:45 AM     Patient Name: Richard Chavez       Medical Record Number: [id number]   Date of Birth: 1927/04/09 Sex: Male         Room/Bed: 4033/4033-01 Payor Info: Payor: MEDICARE / Plan: MEDICARE PART A AND B / Product Type: *No Product type* /   Admitting Diagnosis: R CVA   Admit Date/Time:  10/08/2012  5:09 PM Admission Comments: No comment available   Primary Diagnosis:  CVA (cerebral infarction) Principal Problem: CVA (cerebral infarction)    Patient Active Problem List     Diagnosis  Date Noted   .  CVA (cerebral infarction)  10/09/2012   .  Atrial fibrillation with RVR  10/03/2012   .  Small bowel obstruction  10/03/2012   .  Renal failure, acute on chronic  10/03/2012   .  Hyperkalemia  10/03/2012   .  Stroke, lacunar  10/01/2012   .  Acute on chronic combined systolic and diastolic congestive heart failure, NYHA class 3  10/01/2012   .  Possible reccuring Aspiration pneumonia, worsened by potential CVA  10/01/2012   .  Chronic kidney disease, stage III (moderate)  09/16/2012   .  Chronic combined systolic and diastolic congestive heart failure  09/15/2012   .  CKD (chronic kidney disease) stage 2, GFR 60-89 ml/min  09/15/2012   .  OSA (obstructive sleep apnea)  09/15/2012   .  PNA (pneumonia)  05/16/2012   .  Hypotension  05/16/2012   .  Leukocytosis  05/16/2012   .  Renal failure (ARF), acute on chronic  05/16/2012   .  Dehydration  05/16/2012   .  UTI (lower urinary tract infection)  07/04/2011   .  CAD CABG 1998, patent grafts 06/06/11 after abn Myoview  07/04/2011   .  Syncope, admitted with dehydration, hypotension, acute renalinsufficency.  07/04/2011   .  Pacemaker, St Jude, implanted 2008   07/04/2011   .  Cardiomyopathy, ischemic, EF 30-35% by recent cath  07/04/2011   .  Edema of both legs  07/04/2011   .  Hypertension  04/26/2011   .  Heart attack  04/26/2011   .  NSVT   04/26/2011   .  Hypercholesteremia  04/26/2011   .  Allergic rhinitis  04/26/2011   .  COPD (chronic obstructive pulmonary disease)  04/26/2011   .  GERD (gastroesophageal reflux disease)  04/26/2011   .  Barrett's esophagus  04/26/2011     Expected Discharge Date: Expected Discharge Date: 10/17/12  Team Members Present: Physician leading conference: Dr. Claudette Laws Social Worker Present: Amada Jupiter, LCSW Nurse Present: Laural Roes, RN PT Present: Wanda Plump, PT Clarisse Gouge Ripa, PT) OT Present: Bretta Bang, Verlene Mayer, OT SLP Present: Fae Pippin, SLP        Current Status/Progress  Goal  Weekly Team Focus   Medical     Pt aware of deficits  maintain stability  D/C plans   Bowel/Bladder     continent bowel/bladder  continent of bowel/bladder mod independent  encourage use of bathroom for toileting    Swallow/Nutrition/ Hydration            ADL's  supervision bathing, min assist UB and LB dressing, supervision stand pivot transers and min assist with cane  supervision overall  LUE NM re-ed, activity tolerance   Mobility     Supervision-min A   Supervision overall  Endurance, balance, gait with SPC   Communication            Safety/Cognition/ Behavioral Observations           Pain     no c/o pain  no c/o pain  monitor    Skin     scattered skin tears to bue with allevyn in place  no new skin breakdown   assess qshift, use protective skin care products     *See Care Plan and progress notes for long and short-term goals.    Barriers to Discharge:  will not reach Mod I      Possible Resolutions to Barriers:    son can assist      Discharge Planning/Teaching Needs:    Home with son who can provide supervision level.  Pt would like to resume driving but aware  not recommended      Team Discussion:    Medically stable and ready for d/c on Friday.  Need to complete family education with son tomorrow.  Meeting supervision goals.   Revisions to Treatment Plan:    None    Continued Need for Acute Rehabilitation Level of Care: The patient requires daily medical management by a physician with specialized training in physical medicine and rehabilitation for the following conditions: Daily direction of a multidisciplinary physical rehabilitation program to ensure safe treatment while eliciting the highest outcome that is of practical value to the patient.: Yes Daily medical management of patient stability for increased activity during participation in an intensive rehabilitation regime.: Yes Daily analysis of laboratory values and/or radiology reports with any subsequent need for medication adjustment of medical intervention for : Neurological problems  Ashlan Dignan 10/15/2012, 4:02 PM         Amada Jupiter, LCSW Social Worker Signed  Patient Care Conference Service date: 10/15/2012 4:02 PM  Inpatient RehabilitationTeam Conference and Plan of Care Update Date: 10/15/2012   Time: 10:45 AM     Patient Name: Richard Chavez       Medical Record Number: [id number]   Date of Birth: [birth year] Sex: Male         Room/Bed: 4033/4033-01 Payor Info: Payor: MEDICARE / Plan: MEDICARE PART A AND B / Product Type: *No Product type* /   Admitting Diagnosis: R CVA   Admit Date/Time:  10/08/2012  5:09 PM Admission Comments: No comment available   Primary Diagnosis:  CVA (cerebral infarction) Principal Problem: CVA (cerebral infarction)    Patient Active Problem List     Diagnosis  Date Noted   .  CVA (cerebral infarction)  10/09/2012   .  Atrial fibrillation with RVR  10/03/2012   .  Small bowel obstruction  10/03/2012   .  Renal failure, acute on chronic  10/03/2012   .  Hyperkalemia  10/03/2012   .  Stroke, lacunar  10/01/2012   .  Acute on chronic combined  systolic and diastolic congestive heart failure, NYHA class 3  10/01/2012   .  Possible reccuring Aspiration pneumonia, worsened by potential CVA  10/01/2012   .  Chronic kidney disease, stage III (moderate)  09/16/2012   .  Chronic combined systolic and diastolic congestive heart failure  09/15/2012   .  CKD (chronic kidney disease) stage 2,  GFR 60-89 ml/min  09/15/2012   .  OSA (obstructive sleep apnea)  09/15/2012   .  PNA (pneumonia)  05/16/2012   .  Hypotension  05/16/2012   .  Leukocytosis  05/16/2012   .  Renal failure (ARF), acute on chronic  05/16/2012   .  Dehydration  05/16/2012   .  UTI (lower urinary tract infection)  07/04/2011   .  CAD CABG 1998, patent grafts 06/06/11 after abn Myoview  07/04/2011   .  Syncope, admitted with dehydration, hypotension, acute renalinsufficency.  07/04/2011   .  Pacemaker, St Jude, implanted 2008  07/04/2011   .  Cardiomyopathy, ischemic, EF 30-35% by recent cath  07/04/2011   .  Edema of both legs  07/04/2011   .  Hypertension  04/26/2011   .  Heart attack  04/26/2011   .  NSVT   04/26/2011   .  Hypercholesteremia  04/26/2011   .  Allergic rhinitis  04/26/2011   .  COPD (chronic obstructive pulmonary disease)  04/26/2011   .  GERD (gastroesophageal reflux disease)  04/26/2011   .  Barrett's esophagus  04/26/2011     Expected Discharge Date: Expected Discharge Date: 10/17/12  Team Members Present: Physician leading conference: Dr. Claudette Laws Social Worker Present: Amada Jupiter, LCSW Nurse Present: Laural Roes, RN PT Present: Wanda Plump, PT Clarisse Gouge Ripa, PT) OT Present: Bretta Bang, Verlene Mayer, OT SLP Present: Fae Pippin, SLP        Current Status/Progress  Goal  Weekly Team Focus   Medical     Pt aware of deficits  maintain stability  D/C plans   Bowel/Bladder     continent bowel/bladder  continent of bowel/bladder mod independent  encourage use of bathroom for toileting    Swallow/Nutrition/ Hydration             ADL's     supervision bathing, min assist UB and LB dressing, supervision stand pivot transers and min assist with cane  supervision overall  LUE NM re-ed, activity tolerance   Mobility     Supervision-min A   Supervision overall  Endurance, balance, gait with SPC   Communication            Safety/Cognition/ Behavioral Observations           Pain     no c/o pain  no c/o pain  monitor    Skin     scattered skin tears to bue with allevyn in place  no new skin breakdown   assess qshift, use protective skin care products     *See Care Plan and progress notes for long and short-term goals.    Barriers to Discharge:  will not reach Mod I      Possible Resolutions to Barriers:    son can assist      Discharge Planning/Teaching Needs:    Home with son who can provide supervision level.  Pt would like to resume driving but aware not recommended      Team Discussion:    Medically stable and ready for d/c on Friday.  Need to complete family education with son tomorrow.  Meeting supervision goals.   Revisions to Treatment Plan:    None    Continued Need for Acute Rehabilitation Level of Care: The patient requires daily medical management by a physician with specialized training in physical medicine and rehabilitation for the following conditions: Daily direction of a multidisciplinary physical rehabilitation program to ensure safe treatment while eliciting  the highest outcome that is of practical value to the patient.: Yes Daily medical management of patient stability for increased activity during participation in an intensive rehabilitation regime.: Yes Daily analysis of laboratory values and/or radiology reports with any subsequent need for medication adjustment of medical intervention for : Neurological problems  Nataley Bahri 10/15/2012, 4:02 PM         Philis Kendall, RN Registered Nurse Shared  Patient Care Conference Service date: 10/15/2012 9:54 AM  Inpatient  RehabilitationTeam Conference and Plan of Care Update Date: 10/15/2012   Time: 9:54 AM    Patient Name: Richard Chavez       Medical Record Number: [id number]   Date of Birth: [birth year] Sex: Male         Room/Bed: 4033/4033-01 Payor Info: Payor: MEDICARE / Plan: MEDICARE PART A AND B / Product Type: *No Product type* /   Admitting Diagnosis: R CVA   Admit Date/Time:  10/08/2012  5:09 PM Admission Comments: No comment available   Primary Diagnosis:  CVA (cerebral infarction) Principal Problem: CVA (cerebral infarction)    Patient Active Problem List     Diagnosis  Date Noted   .  CVA (cerebral infarction)  10/09/2012   .  Atrial fibrillation with RVR  10/03/2012   .  Small bowel obstruction  10/03/2012   .  Renal failure, acute on chronic  10/03/2012   .  Hyperkalemia  10/03/2012   .  Stroke, lacunar  10/01/2012   .  Acute on chronic combined systolic and diastolic congestive heart failure, NYHA class 3  10/01/2012   .  Possible reccuring Aspiration pneumonia, worsened by potential CVA  10/01/2012   .  Chronic kidney disease, stage III (moderate)  09/16/2012   .  Chronic combined systolic and diastolic congestive heart failure  09/15/2012   .  CKD (chronic kidney disease) stage 2, GFR 60-89 ml/min  09/15/2012   .  OSA (obstructive sleep apnea)  09/15/2012   .  PNA (pneumonia)  05/16/2012   .  Hypotension  05/16/2012   .  Leukocytosis  05/16/2012   .  Renal failure (ARF), acute on chronic  05/16/2012   .  Dehydration  05/16/2012   .  UTI (lower urinary tract infection)  07/04/2011   .  CAD CABG 1998, patent grafts 06/06/11 after abn Myoview  07/04/2011   .  Syncope, admitted with dehydration, hypotension, acute renalinsufficency.  07/04/2011   .  Pacemaker, St Jude, implanted 2008  07/04/2011   .  Cardiomyopathy, ischemic, EF 30-35% by recent cath  07/04/2011   .  Edema of both legs  07/04/2011   .  Hypertension  04/26/2011   .  Heart attack  04/26/2011   .  NSVT   04/26/2011   .   Hypercholesteremia  04/26/2011   .  Allergic rhinitis  04/26/2011   .  COPD (chronic obstructive pulmonary disease)  04/26/2011   .  GERD (gastroesophageal reflux disease)  04/26/2011   .  Barrett's esophagus  04/26/2011     Expected Discharge Date:   Team Members Present:         Current Status/Progress  Goal  Weekly Team Focus   Medical            Bowel/Bladder     pt continent of bowel and bladder; can be incont. at times due to lasix  cont. of bowel and bladder     Swallow/Nutrition/ Hydration  ADL's     supervision bathing, min assist UB and LB dressing, supervision stand pivot transers and min assist with cane  supervision overall  LUE NM re-ed, activity tolerance   Mobility     Supervision-min A   Supervision overall  Endurance, balance, gait with SPC   Communication            Safety/Cognition/ Behavioral Observations           Pain     no complaints of pain       Skin     scattered skin tears to BUE with fragile skin. Bruises noted; allyvyn inplace to skin tears  no further skin breakdown  assess skin q shift and monitor skin tears for dressign changes     *See Care Plan and progress notes for long and short-term goals.    Barriers to Discharge:       Possible Resolutions to Barriers:         Discharge Planning/Teaching Needs:    Home with son who can provide supervision level.  Pt would like to resume driving but aware not recommended      Team Discussion:    Medically stable and ready for d/c on Friday.  Need to complete family education with son tomorrow.  Meeting supervision goals.  Revisions to Treatment Plan:    None     Hedy Camara 10/15/2012, 9:54 AM

## 2012-10-15 NOTE — Patient Care Conference (Signed)
Inpatient RehabilitationTeam Conference and Plan of Care Update Date: 10/15/2012   Time: 10:45 AM    Patient Name: Richard Chavez      Medical Record Number: [id number]  Date of Birth: August 27, 1926 Sex: Male         Room/Bed: 4033/4033-01 Payor Info: Payor: MEDICARE / Plan: MEDICARE PART A AND B / Product Type: *No Product type* /    Admitting Diagnosis: R CVA  Admit Date/Time:  10/08/2012  5:09 PM Admission Comments: No comment available   Primary Diagnosis:  CVA (cerebral infarction) Principal Problem: CVA (cerebral infarction)  Patient Active Problem List   Diagnosis Date Noted  . CVA (cerebral infarction) 10/09/2012  . Atrial fibrillation with RVR 10/03/2012  . Small bowel obstruction 10/03/2012  . Renal failure, acute on chronic 10/03/2012  . Hyperkalemia 10/03/2012  . Stroke, lacunar 10/01/2012  . Acute on chronic combined systolic and diastolic congestive heart failure, NYHA class 3 10/01/2012  . Possible reccuring Aspiration pneumonia, worsened by potential CVA 10/01/2012  . Chronic kidney disease, stage III (moderate) 09/16/2012  . Chronic combined systolic and diastolic congestive heart failure 09/15/2012  . CKD (chronic kidney disease) stage 2, GFR 60-89 ml/min 09/15/2012  . OSA (obstructive sleep apnea) 09/15/2012  . PNA (pneumonia) 05/16/2012  . Hypotension 05/16/2012  . Leukocytosis 05/16/2012  . Renal failure (ARF), acute on chronic 05/16/2012  . Dehydration 05/16/2012  . UTI (lower urinary tract infection) 07/04/2011  . CAD CABG 1998, patent grafts 06/06/11 after abn Myoview 07/04/2011  . Syncope, admitted with dehydration, hypotension, acute renalinsufficency. 07/04/2011  . Pacemaker, St Jude, implanted 2008 07/04/2011  . Cardiomyopathy, ischemic, EF 30-35% by recent cath 07/04/2011  . Edema of both legs 07/04/2011  . Hypertension 04/26/2011  . Heart attack 04/26/2011  . NSVT  04/26/2011  . Hypercholesteremia 04/26/2011  . Allergic rhinitis 04/26/2011  . COPD  (chronic obstructive pulmonary disease) 04/26/2011  . GERD (gastroesophageal reflux disease) 04/26/2011  . Barrett's esophagus 04/26/2011    Expected Discharge Date: Expected Discharge Date: 10/17/12  Team Members Present: Physician leading conference: Dr. Claudette Laws Social Worker Present: Amada Jupiter, LCSW Nurse Present: Laural Roes, RN PT Present: Wanda Plump, PT Clarisse Gouge Ripa, PT) OT Present: Bretta Bang, Verlene Mayer, OT SLP Present: Fae Pippin, SLP     Current Status/Progress Goal Weekly Team Focus  Medical   Pt aware of deficits  maintain stability  D/C plans   Bowel/Bladder   continent bowel/bladder  continent of bowel/bladder mod independent  encourage use of bathroom for toileting    Swallow/Nutrition/ Hydration             ADL's   supervision bathing, min assist UB and LB dressing, supervision stand pivot transers and min assist with cane  supervision overall  LUE NM re-ed, activity tolerance   Mobility   Supervision-min A   Supervision overall  Endurance, balance, gait with SPC   Communication             Safety/Cognition/ Behavioral Observations            Pain   no c/o pain  no c/o pain  monitor    Skin   scattered skin tears to bue with allevyn in place  no new skin breakdown   assess qshift, use protective skin care products      *See Care Plan and progress notes for long and short-term goals.  Barriers to Discharge: will not reach Mod I    Possible Resolutions to Barriers:  son can  assist    Discharge Planning/Teaching Needs:  Home with son who can provide supervision level.  Pt would like to resume driving but aware not recommended      Team Discussion:  Medically stable and ready for d/c on Friday.  Need to complete family education with son tomorrow.  Meeting supervision goals.  Revisions to Treatment Plan:  None   Continued Need for Acute Rehabilitation Level of Care: The patient requires daily medical management by a  physician with specialized training in physical medicine and rehabilitation for the following conditions: Daily direction of a multidisciplinary physical rehabilitation program to ensure safe treatment while eliciting the highest outcome that is of practical value to the patient.: Yes Daily medical management of patient stability for increased activity during participation in an intensive rehabilitation regime.: Yes Daily analysis of laboratory values and/or radiology reports with any subsequent need for medication adjustment of medical intervention for : Neurological problems  Evea Sheek 10/15/2012, 4:02 PM

## 2012-10-15 NOTE — Progress Notes (Signed)
Patient ID: Richard Chavez, male   DOB: [birth year], 79 y.o.   MRN: [id number] Subjective/Complaints: 79 y.o. male with history of GERD, COPD, CAD, advanced systolic and diastolic heart failure, recent admission for PNA who was readmitted on 10/01/12 with SOB and BLE edema. He was noted to have LUE weakness with numbness and family reported confusion as well as difficulty walking. He was treated with IV diuretics for acute on chronic CHF with improvement in respiratory status and LE edema. CT head done per neurology input due to concerns of subcortical stroke. CT head without acute changes and stable mild atrophy with white matter ischemic changes. Carotid dopplers with right 40-59% ICA stenosis and L-60-79% ICA stenosis. He was changed to plavix per neurology recommendations. He reported problems with constipation and was noted to have SBO. Dr. Derrell Lolling consulted and recommended NGT to low suction. Atrial fibrillation with RVR treated with IV metoprolol and cardiology recommended changing patient to eliquis. Treated with IV vanc/ zosyn for potential aspiration PNA  No C/os   Review of Systems  Gastrointestinal: Negative for nausea, vomiting and constipation.  Genitourinary: Negative for urgency.  Psychiatric/Behavioral: Negative for depression.  All other systems reviewed and are negative.     Objective: Vital Signs: Blood pressure 123/73, pulse 77, temperature 97.8 F (36.6 C), temperature source Oral, resp. rate 17, height 6\' 2"  (1.88 m), weight 101.7 kg (224 lb 3.3 oz), SpO2 96.00%. No results found. Results for orders placed during the hospital encounter of 10/08/12 (from the past 72 hour(s))  BASIC METABOLIC PANEL     Status: Abnormal   Collection Time    10/13/12  5:29 AM      Result Value Range   Sodium 138  135 - 145 mEq/L   Potassium 4.1  3.5 - 5.1 mEq/L   Chloride 105  96 - 112 mEq/L   CO2 25  19 - 32 mEq/L   Glucose, Bld 85  70 - 99 mg/dL   BUN 21  6 - 23 mg/dL   Creatinine, Ser  8.29  0.50 - 1.35 mg/dL   Calcium 8.5  8.4 - 56.2 mg/dL   GFR calc non Af Amer 54 (*) >90 mL/min   GFR calc Af Amer 62 (*) >90 mL/min   Comment:            The eGFR has been calculated     using the CKD EPI equation.     This calculation has not been     validated in all clinical     situations.     eGFR's persistently     <90 mL/min signify     possible Chronic Kidney Disease.      Nursing note and vitals reviewed.  Constitutional: He is oriented to person, place, and time. He appears well-developed and well-nourished.  HENT:  Head: Normocephalic and atraumatic.  Eyes: Pupils are equal, round, and reactive to light.  Neck: Normal range of motion. Neck supple.  Cardiovascular: Normal rate and regular rhythm.  Pulmonary/Chest: Effort normal. No respiratory distress. He has no wheezes.  Abdominal: He exhibits distension.  Round, decreased BS.  Musculoskeletal: He exhibits no edema.  Neurological: He is alert and oriented to person,hospital,. Needed cues for date, day of week. Tends to laugh through deficits but acknowledges them.  Mild dysarthria. Follows commands without difficulty. LUE 3- delt, 3 bicep,tricep, WE,HI are 3 to 3+/5. Marland Kitchen LLE grossly 3/5 HF, 3+ to 4- KE, 4/5 ADF. Decreased sensation bilateral feet in stocking glove distribution  but otherwise non-focal. Right side grossly 4 to 5/5.  Skin: Skin is warm and dry. noted. Chronic vascular changes Psychiatric:   mood and affect are appropriate   Assessment/Plan: 1. Functional deficits secondary to Right subcortical stroke with left HP which require 3+ hours per day of interdisciplinary therapy in a comprehensive inpatient rehab setting. Physiatrist is providing close team supervision and 24 hour management of active medical problems listed below. Physiatrist and rehab team continue to assess barriers to discharge/monitor patient progress toward functional and medical goals.  Team conference today please see physician  documentation under team conference tab, met with team face-to-face to discuss problems,progress, and goals. Formulized individual treatment plan based on medical history, underlying problem and comorbidities. FIM: FIM - Bathing Bathing Steps Patient Completed: Chest;Right Arm;Left Arm;Abdomen;Front perineal area;Buttocks;Right upper leg;Left upper leg;Right lower leg (including foot);Left lower leg (including foot) Bathing: 5: Supervision: Safety issues/verbal cues  FIM - Upper Body Dressing/Undressing Upper body dressing/undressing steps patient completed: Put head through opening of pull over shirt/dress;Thread/unthread left sleeve of pullover shirt/dress;Thread/unthread right sleeve of pullover shirt/dresss Upper body dressing/undressing: 4: Min-Patient completed 75 plus % of tasks FIM - Lower Body Dressing/Undressing Lower body dressing/undressing steps patient completed: Thread/unthread right underwear leg;Thread/unthread left underwear leg;Pull underwear up/down;Thread/unthread right pants leg;Thread/unthread left pants leg;Pull pants up/down;Don/Doff right shoe;Don/Doff left shoe Lower body dressing/undressing: 4: Min-Patient completed 75 plus % of tasks  FIM - Toileting Toileting steps completed by patient: Adjust clothing prior to toileting;Performs perineal hygiene;Adjust clothing after toileting Toileting Assistive Devices: Grab bar or rail for support Toileting: 5: Supervision: Safety issues/verbal cues  FIM - Diplomatic Services operational officer Devices: Occupational hygienist Transfers: 5-To toilet/BSC: Supervision (verbal cues/safety issues);5-From toilet/BSC: Supervision (verbal cues/safety issues)  FIM - Banker Devices: Teacher, music: 5: Chair or W/C > Bed: Supervision (verbal cues/safety issues);5: Bed > Chair or W/C: Supervision (verbal cues/safety issues)  FIM - Locomotion: Wheelchair Distance: 50 Locomotion: Wheelchair:  1: Total Assistance/staff pushes wheelchair (Pt<25%) FIM - Locomotion: Ambulation Locomotion: Ambulation Assistive Devices: Emergency planning/management officer Ambulation/Gait Assistance: 5: Supervision Locomotion: Ambulation: 5: Travels 150 ft or more with supervision/safety issues  Comprehension Comprehension Mode: Auditory Comprehension: 6-Follows complex conversation/direction: With extra time/assistive device  Expression Expression Mode: Verbal Expression: 6-Expresses complex ideas: With extra time/assistive device  Social Interaction Social Interaction: 6-Interacts appropriately with others with medication or extra time (anti-anxiety, antidepressant).  Problem Solving Problem Solving: 6-Solves complex problems: With extra time  Memory Memory: 6-More than reasonable amt of time  Medical Problem List and Plan:  1. DVT Prophylaxis/Anticoagulation: Pharmaceutical: Other (comment)  2. Pain Management: tylenol, gabapentin for neuropathic pain  3. Mood: team to provide egosupport. Pt shows no signs of depression on examination, however cognition and attention don't allow for a reliable assessment.  4. Neuropsych: This patient is not capable of making decisions on his own behalf.  5. Atrial Fibrillation: Monitor HR with bid checks. Continues with tachycardia with activity--likely due to deconditioned. Will continue  6. Acute on chronic renal failure: Lasix discontinued. May need to stay dry to avoid recurrent exacerbation of CHF. Low salt diet. Check daily weights. Back on low dose lasix   7. Recent PNA resolved 8. COPD: Respiratory status improving. Continue combivent bid with spiriva and low dose oral steroids 9.  Mild HypoK , recheck BMET improved after supplement, now on lasix cont to monitor  LOS (Days) 7 A FACE TO FACE EVALUATION WAS PERFORMED  Michelena Culmer E 10/15/2012, 9:59 AM

## 2012-10-15 NOTE — Progress Notes (Signed)
Social Work Patient ID: Richard Chavez, male   DOB: 02-17-1927, 79 y.o.   MRN: [id number]   Contacted son today to schedule family education.  He is planning to be here tomorrow at 1:00 pm.  Amada Jupiter, LCSW

## 2012-10-15 NOTE — Progress Notes (Signed)
Physical Therapy Session Note  Patient Details  Name: Richard Chavez MRN: [id number] Date of Birth: 1927-03-10  Today's Date: 10/15/2012 Time: Session #1:  [id number] Session #2: [id number] Time Calculation (min): Session #1: 52 min, Session #2: 32 min   Short Term Goals: Week 1:  PT Short Term Goal 1 (Week 1): Pt will perform all bed<>chair trainsfers with Min A PT Short Term Goal 2 (Week 1): Pt will ambulate 150' with Min A and LRAD PT Short Term Goal 3 (Week 1): Pt will ascend/descend 2 stairs with rails and MinA  PT Short Term Goal 4 (Week 1): Pt will demonstrate S with dynamic balance during functional task  Skilled Therapeutic Interventions/Progress Updates:    Session #1: This session focused on WC mobility with bil legs and right arm supervision 150' x 2.  Gait with SPC supervision > 150' with verbal cues for safety due to pt picking cane up at times and not using it.  Stairs bil rails supervision with alternating pattern.  5 steps 4-6" in height.  Car transfer with supervision.  Verbal cues for safety.   Session #2: This session focused on gait training and endurance training.  Gait to and from the gym with [facility] >150' with verbal cues to make sure he is not picking up the cane when he walks.  It needs to make contact with every step.  Supervision for cueing and safety.  NuStep Level 1 RPE 12 on Borg Scale x 10 mins bil upper and lower extremities working on strength and endurance training.  Gait back to room with [facility] supervision.  Gait speed slower with fatigue.    Therapy Documentation Precautions:  Precautions Precautions: Fall Precaution Comments: LUE weakness, fragile skin Restrictions Weight Bearing Restrictions: No Other Position/Activity Restrictions: Support LUE   Vital Signs: Oxygen Therapy O2 Device: None (Room air) Pain: Pain Assessment Pain Assessment: No/denies pain Pain Score: 0-No pain    Locomotion : Ambulation Ambulation/Gait Assistance: 5:  Supervision Wheelchair Mobility Distance: 150   See FIM for current functional status  Therapy/Group: Individual Therapy  Lurena Joiner B. Loralei Radcliffe, PT, DPT [phone]   10/15/2012, 10:57 AM

## 2012-10-15 NOTE — Progress Notes (Signed)
Occupational Therapy Session Note  Patient Details  Name: Richard Chavez MRN: [id number] Date of Birth: Nov 04, 1926  Today's Date: 10/15/2012 Time: 4098-1191 Time Calculation (min): 45 min  Short Term Goals: Week 1:  OT Short Term Goal 1 (Week 1): Pt will complete UB dressing with min assist OT Short Term Goal 2 (Week 1): Pt will complete LB dressing with mod assist OT Short Term Goal 3 (Week 1): Pt will complete toilet transfer with supervision with necessary AD. OT Short Term Goal 4 (Week 1): Pt will complete walk-in shower transfer with min assist OT Short Term Goal 5 (Week 1): Pt will demonstrate use of LUE to assist in grooming tasks as gross assist level with min cues  Skilled Therapeutic Interventions/Progress Updates:    Pt seen for 1:1 OT with focus on NM re-ed and HEP with dominant LUE.  Provided pt with therapy putty (soft level) and HEP on theraputty exercises to increase strength and FMC of Lt hand.  Demonstrated and had pt return demonstrate each exercise on sheet with pt with increased time with tip to tip pinch and retrieving pennies from putty.  Pt tends to grasp with lateral pinch and gross grasp movements, requiring increased cues and time for lateral pinch.  Encouraged pt to perform exercises 2-3x/day to further strengthen gross and [facility] of dominant Lt hand.  Therapy Documentation Precautions:  Precautions Precautions: Fall Precaution Comments: LUE weakness, fragile skin Restrictions Weight Bearing Restrictions: No Other Position/Activity Restrictions: Support LUE Pain:  Pt with no c/o pain this session.  See FIM for current functional status  Therapy/Group: Individual Therapy  Leonette Monarch 10/15/2012, 2:56 PM

## 2012-10-16 ENCOUNTER — Inpatient Hospital Stay (HOSPITAL_COMMUNITY): Payer: Medicare Other | Admitting: Occupational Therapy

## 2012-10-16 ENCOUNTER — Inpatient Hospital Stay (HOSPITAL_COMMUNITY): Payer: Medicare Other | Admitting: *Deleted

## 2012-10-16 ENCOUNTER — Inpatient Hospital Stay (HOSPITAL_COMMUNITY): Payer: Medicare Other | Admitting: Physical Therapy

## 2012-10-16 MED ORDER — FUROSEMIDE 20 MG PO TABS
10.0000 mg | ORAL_TABLET | Freq: Every day | ORAL | Status: DC
  Administered 2012-10-16: 10 mg via ORAL
  Administered 2012-10-17: 08:00:00 via ORAL
  Filled 2012-10-16 (×3): qty 0.5

## 2012-10-16 NOTE — Progress Notes (Signed)
Physical Therapy Discharge Summary  Patient Details  Name: Richard Chavez MRN: [id number] Date of Birth: 1926-06-14  Today's Date: 10/16/2012 Time: 4098-1191 Time Calculation (min): 55 min  Patient has met 6 of 6 long term goals due to improved activity tolerance, improved balance and increased strength.  Patient to discharge at an ambulatory level Supervision.   Patient's care partner is independent to provide the necessary physical assistance at discharge.  Reasons goals not met: NA- all goals met   Recommendation:  Patient will benefit from ongoing skilled PT services in home health setting to continue to advance safe functional mobility, address ongoing impairments in balance, strength, gait training, and minimize fall risk.  Equipment: No equipment provided  Reasons for discharge: treatment goals met and discharge from hospital  Patient/family agrees with progress made and goals achieved: Yes  PT Discharge Precautions/Restrictions Precautions Precautions: Fall Precaution Comments: LUE weakness, fragile skin Restrictions Weight Bearing Restrictions: No Vital Signs Therapy Vitals Temp: 97.4 F (36.3 C) Temp src: Oral Pulse Rate: 70 Resp: 20 BP: 133/68 mmHg Patient Position, if appropriate: Sitting Oxygen Therapy SpO2: 98 % O2 Device: None (Room air)    Vision/Perception  Vision - History Baseline Vision: Wears glasses all the time Patient Visual Report: No change from baseline  Cognition Arousal/Alertness: Awake/alert Orientation Level: Oriented X4 Attention: Selective Memory: Appears intact Awareness: Appears intact Problem Solving: Appears intact Safety/Judgment: Appears intact Sensation Sensation Light Touch: Impaired by gross assessment Light Touch Impaired Details: Impaired LUE;Impaired LLE;Impaired RLE Proprioception: Impaired by gross assessment Additional Comments: h/o decreased sensation in bil feet.  Needs to wear shoes during gait for protection  of feet.   Coordination Gross Motor Movements are Fluid and Coordinated: No Fine Motor Movements are Fluid and Coordinated: No     Mobility Bed Mobility Bed Mobility: Supine to Sit;Sitting - Scoot to Edge of Bed;Sit to Supine Supine to Sit: 6: Modified independent (Device/Increase time);HOB flat Sitting - Scoot to Edge of Bed: 6: Modified independent (Device/Increase time) Sit to Supine: 6: Modified independent (Device/Increase time);HOB flat Transfers Sit to Stand: 5: Supervision;From bed;From chair/3-in-1;With armrests Stand to Sit: 5: Supervision;With armrests;With upper extremity assist;To bed;To chair/3-in-1 Stand Pivot Transfers: 5: Supervision;With armrests Locomotion  Ambulation Ambulation: Yes Ambulation/Gait Assistance: 5: Supervision Ambulation Distance (Feet): 200 Feet Assistive device: Straight cane Ambulation/Gait Assistance Details: Verbal cues for precautions/safety;Verbal cues for gait pattern Ambulation/Gait Assistance Details: Verbal cues to keep contact with cane to the floor.  Verbal cues for bil foot clearance due to shuffling gait pattern.  Education completed with son re: safe guarding practices and cues that pt needs for safety.   Gait Gait Pattern: Step-through pattern;Shuffle;Trunk flexed Stairs / Additional Locomotion Stairs: Yes Stairs Assistance: 5: Supervision Stairs Assistance Details: Verbal cues for precautions/safety Stairs Assistance Details (indicate cue type and reason): supervision for safety, education completed with son re: safe guarding techniques.   Stair Management Technique: One rail Left;Step to pattern;Forwards;With cane Number of Stairs: 10 Height of Stairs:  (4-6")     Balance Static Sitting Balance Static Sitting - Balance Support: Feet supported Static Sitting - Level of Assistance: 6: Modified independent (Device/Increase time) Dynamic Sitting Balance Dynamic Sitting - Balance Support: Feet supported Dynamic Sitting - Level  of Assistance: 5: Stand by assistance Static Standing Balance Static Standing - Balance Support: Right upper extremity supported Static Standing - Level of Assistance: 5: Stand by assistance Dynamic Standing Balance Dynamic Standing - Balance Support: Right upper extremity supported Dynamic Standing - Level of Assistance: 5: Stand  by assistance Extremity Assessment      RLE Assessment RLE Assessment: Exceptions to [facility] RLE Strength RLE Overall Strength Comments: 4+/5 throughout LLE Assessment LLE Assessment: Exceptions to [facility] LLE Strength LLE Overall Strength Comments: 4+/5 throughout  Skilled Interventions: Today's session focused on family education.  [name] son, [name] was present for entire treatment session.  We reviewed gait, stairs with one rail and cane, car transfer, bed mobility, furniture transfers all supervision level (except bed mobility mod I).  Cues were give to son re: safe guarding technique and cues to give to pt for safety.  We discussed at length fall precautions and home modifications as well as floor transfer strategies if the pt ever were to fall.  Son reports understanding and demonstrated safe guarding of pt throughout session.    See FIM for current functional status  Lurena Joiner B. Jakayla Schweppe, PT, DPT [phone]   10/16/2012, 3:59 PM

## 2012-10-16 NOTE — Progress Notes (Signed)
Physical Therapy Session Note  Patient Details  Name: Richard Chavez MRN: [id number] Date of Birth: 1927/05/06  Today's Date: 10/16/2012 Time:  9:46-10:30 ( )   Skilled Therapeutic Interventions/Progress Updates:  Tx focused on balance, safety with transfers, and gait training.  Pt up in chair, needing to use restroom. S overall, needing cues to bring cane.  Gait in controlled environment 2x150' with [facility] and close S with cues for foot clearance, but unable to adjust shuffling pattern. Discussed falls risk implication. Also needs continued cues to use cane with each step, not drag it.   Dynamic gait including tandem talking (difficult), retro walking, and side stepping with SPC and close S 2x30' each task with most difficulty retro walking. Pt had no LOB, but tasks small, shuffling steps.   Obstacle course with turning, cone weaking, step overs, and compliant surface 4x25' with close S and no LOB and good cane management.   Pt challenged to pick uip objects from floor and carry, but difficult to carry in L hand due to waekness, so pt carries cane and puts objects in R hand. Encouraged pt to ask son for help carrying.   Curb with min-guard A x2 and cues for sequence and safety.        Therapy Documentation Precautions:  Precautions Precautions: Fall Precaution Comments: LUE weakness, fragile skin Restrictions Weight Bearing Restrictions: No Other Position/Activity Restrictions: Support LUE    Vital Signs: Oxygen Therapy O2 Device: None (Room air) Pain: Pain Assessment Pain Assessment: No/denies pain Locomotion : Ambulation Ambulation/Gait Assistance: 5: Supervision   See FIM for current functional status  Therapy/Group: Individual Therapy  Clydene Laming, PT, DPT  10/16/2012, 10:06 AM

## 2012-10-16 NOTE — Progress Notes (Signed)
Occupational Therapy Discharge Summary  Patient Details  Name: LYN DEEMER MRN: [id number] Date of Birth: 1926-11-07  Today's Date: 10/17/2012  Patient has met 11 of 11 long term goals due to improved activity tolerance, ability to compensate for deficits, functional use of  LEFT upper and LEFT lower extremity and improved awareness.  Patient to discharge at overall Supervision level.  Patient's care partner is independent to provide the necessary assistance at discharge.    Reasons goals not met: N/A  Recommendation:  Patient will benefit from ongoing skilled OT services in home health setting to continue to advance functional skills in the area of BADL, iADL and Reduce care partner burden.  Equipment: No equipment provided  Reasons for discharge: treatment goals met and discharge from hospital  Patient/family agrees with progress made and goals achieved: Yes  OT Discharge Precautions/Restrictions  Precautions Precautions: Fall Precaution Comments: LUE weakness, fragile skin Restrictions Weight Bearing Restrictions: No Vital Signs Oxygen Therapy O2 Device: None (Room air) Pain Pain Assessment Pain Assessment: No/denies pain ADL ADL Eating: Set up Where Assessed-Eating: Chair Grooming: Modified independent Where Assessed-Grooming: Sitting at sink Upper Body Bathing: Supervision/safety Where Assessed-Upper Body Bathing: Shower Lower Body Bathing: Supervision/safety Where Assessed-Lower Body Bathing: Shower Upper Body Dressing: Supervision/safety Where Assessed-Upper Body Dressing: Chair Lower Body Dressing: Supervision/safety Where Assessed-Lower Body Dressing: Chair Toileting: Modified independent Where Assessed-Toileting: Teacher, adult education: Distant supervision Statistician Method: Proofreader: Acupuncturist: Distant supervision Film/video editor Method: Designer, industrial/product: Surveyor, mining  bench;Grab bars Vision/Perception  Vision - History Baseline Vision: Wears glasses all the time Patient Visual Report: No change from baseline Vision - Assessment Eye Alignment: Within Functional Limits  Cognition Arousal/Alertness: Awake/alert Orientation Level: Oriented X4 Attention: Selective Memory: Appears intact Awareness: Appears intact Problem Solving: Appears intact Safety/Judgment: Appears intact Sensation Sensation Light Touch: Impaired by gross assessment Light Touch Impaired Details: Impaired LUE Proprioception: Impaired by gross assessment Coordination Gross Motor Movements are Fluid and Coordinated: No Fine Motor Movements are Fluid and Coordinated: No Finger Nose Finger Test: decreased motor control with graded movements with LUE Extremity/Trunk Assessment RUE Assessment RUE Assessment: Within Functional Limits LUE Assessment LUE Assessment: Exceptions to WFL LUE AROM (degrees) LUE Overall AROM Comments: able to elicit full shoulder flexion, however required increased time and effort with decreased motor control LUE Strength LUE Overall Strength Comments: grossly 4/5 proximal, 3/5 distal  See FIM for current functional status  Leonette Monarch 10/17/2012, 8:25 AM

## 2012-10-16 NOTE — Progress Notes (Signed)
Social Work Patient ID: Richard Chavez, male   DOB: 1926/05/19, 79 y.o.   MRN: [id number] Met with pt to inform him of team conference goals -supervision/mod/i level and discharge Friday 6/13.  His son is coming in today for family education and Can provide supervision level.  Discussed follow up with pt he is agreeable to home health therapies, has no pref.  Has all DME will prepare for discharge tomorrow.

## 2012-10-16 NOTE — Progress Notes (Signed)
Patient ID: Richard Chavez, male   DOB: Sep 17, 1926, 79 y.o.   MRN: [id number] Subjective/Complaints: 79 y.o. male with history of GERD, COPD, CAD, advanced systolic and diastolic heart failure, recent admission for PNA who was readmitted on 10/01/12 with SOB and BLE edema. He was noted to have LUE weakness with numbness and family reported confusion as well as difficulty walking. He was treated with IV diuretics for acute on chronic CHF with improvement in respiratory status and LE edema. CT head done per neurology input due to concerns of subcortical stroke. CT head without acute changes and stable mild atrophy with white matter ischemic changes. Carotid dopplers with right 40-59% ICA stenosis and L-60-79% ICA stenosis. He was changed to plavix per neurology recommendations. He reported problems with constipation and was noted to have SBO. Dr. Derrell Lolling consulted and recommended NGT to low suction. Atrial fibrillation with RVR treated with IV metoprolol and cardiology recommended changing patient to eliquis. Treated with IV vanc/ zosyn for potential aspiration PNA  No c/os, slept well  Review of Systems  Gastrointestinal: Negative for nausea, vomiting and constipation.  Genitourinary: Negative for urgency.  Psychiatric/Behavioral: Negative for depression.  All other systems reviewed and are negative.     Objective: Vital Signs: Blood pressure 135/77, pulse 79, temperature 97.7 F (36.5 C), temperature source Oral, resp. rate 20, height 6\' 2"  (1.88 m), weight 101.7 kg (224 lb 3.3 oz), SpO2 98.00%. No results found. No results found for this or any previous visit (from the past 72 hour(s)).    Nursing note and vitals reviewed.  Constitutional: He is oriented to person, place, and time. He appears well-developed and well-nourished.  HENT:  Head: Normocephalic and atraumatic.  Eyes: Pupils are equal, round, and reactive to light.  Neck: Normal range of motion. Neck supple.  Cardiovascular: Normal  rate and regular rhythm.  Pulmonary/Chest: Effort normal. No respiratory distress. He has no wheezes.  Abdominal: He exhibits distension.  Round, decreased BS.  Musculoskeletal: He exhibits no edema.  Neurological: He is alert and oriented to person,hospital,. Needed cues for date, day of week. Tends to laugh through deficits but acknowledges them.  Mild dysarthria. Follows commands without difficulty. LUE 3- delt, 3 bicep,tricep, WE,HI are 3 to 3+/5. Marland Kitchen LLE grossly 3/5 HF, 3+ to 4- KE, 4/5 ADF. Decreased sensation bilateral feet in stocking glove distribution but otherwise non-focal. Right side grossly 4 to 5/5.  Skin: Skin is warm and dry. noted. Chronic vascular changes Psychiatric:   mood and affect are appropriate   Assessment/Plan: 1. Functional deficits secondary to Right subcortical stroke with left HP which require 3+ hours per day of interdisciplinary therapy in a comprehensive inpatient rehab setting. Physiatrist is providing close team supervision and 24 hour management of active medical problems listed below. Physiatrist and rehab team continue to assess barriers to discharge/monitor patient progress toward functional and medical goals.   FIM: FIM - Bathing Bathing Steps Patient Completed: Chest;Right Arm;Left Arm;Abdomen;Front perineal area;Buttocks;Right upper leg;Left upper leg;Right lower leg (including foot);Left lower leg (including foot) Bathing: 5: Supervision: Safety issues/verbal cues  FIM - Upper Body Dressing/Undressing Upper body dressing/undressing steps patient completed: Put head through opening of pull over shirt/dress;Thread/unthread left sleeve of pullover shirt/dress;Thread/unthread right sleeve of pullover shirt/dresss Upper body dressing/undressing: 4: Min-Patient completed 75 plus % of tasks FIM - Lower Body Dressing/Undressing Lower body dressing/undressing steps patient completed: Thread/unthread right underwear leg;Thread/unthread left underwear  leg;Pull underwear up/down;Thread/unthread right pants leg;Thread/unthread left pants leg;Pull pants up/down;Don/Doff right shoe;Don/Doff left shoe  Lower body dressing/undressing: 4: Min-Patient completed 75 plus % of tasks  FIM - Toileting Toileting steps completed by patient: Adjust clothing prior to toileting;Performs perineal hygiene;Adjust clothing after toileting Toileting Assistive Devices: Grab bar or rail for support Toileting: 5: Supervision: Safety issues/verbal cues  FIM - Diplomatic Services operational officer Devices: Grab bars Toilet Transfers: 5-To toilet/BSC: Supervision (verbal cues/safety issues);5-From toilet/BSC: Supervision (verbal cues/safety issues)  FIM - Bed/Chair Transfer Bed/Chair Transfer Assistive Devices: Bed rails;Cane Bed/Chair Transfer: 5: Supine > Sit: Supervision (verbal cues/safety issues);5: Sit > Supine: Supervision (verbal cues/safety issues);4: Chair or W/C > Bed: Min A (steadying Pt. > 75%);4: Bed > Chair or W/C: Min A (steadying Pt. > 75%)  FIM - Locomotion: Wheelchair Distance: 150 Locomotion: Wheelchair: 5: Travels 150 ft or more: maneuvers on rugs and over door sills with supervision, cueing or coaxing FIM - Locomotion: Ambulation Locomotion: Ambulation Assistive Devices: Emergency planning/management officer Ambulation/Gait Assistance: 5: Supervision Locomotion: Ambulation: 5: Travels 150 ft or more with supervision/safety issues  Comprehension Comprehension Mode: Auditory Comprehension: 6-Follows complex conversation/direction: With extra time/assistive device  Expression Expression Mode: Verbal Expression: 6-Expresses complex ideas: With extra time/assistive device  Social Interaction Social Interaction: 6-Interacts appropriately with others with medication or extra time (anti-anxiety, antidepressant).  Problem Solving Problem Solving: 6-Solves complex problems: With extra time  Memory Memory: 6-More than reasonable amt of time  Medical Problem  List and Plan:  1. DVT Prophylaxis/Anticoagulation: Pharmaceutical: Other (comment)  2. Pain Management: tylenol, gabapentin for neuropathic pain  3. Mood: team to provide egosupport. 4. Neuropsych: This patient is  capable of making decisions on his own behalf.  5. Atrial Fibrillation: Monitor HR with bid checks. Continues with tachycardia with activity--likely due to deconditioned. Will continue  6. Acute on chronic renal failure: Lasix discontinued. May need to stay dry to avoid recurrent exacerbation of CHF. Low salt diet. Check daily weights. Back on low dose lasix   7. Recent PNA resolved 8. COPD: Respiratory status improving. Continue combivent bid with spiriva and low dose oral steroids 9.  Mild HypoK , recheck BMET improved after supplement, now on lasix cont to monitor  LOS (Days) 8 A FACE TO FACE EVALUATION WAS PERFORMED  Tyrin Herbers E 10/16/2012, 8:29 AM

## 2012-10-16 NOTE — Progress Notes (Signed)
Occupational Therapy Session Note  Patient Details  Name: Richard Chavez MRN: [id number] Date of Birth: 10-15-1926  Today's Date: 10/16/2012 Time: 1430-1500 Time Calculation (min): 30 min  Short Term Goals: Week 1:  OT Short Term Goal 1 (Week 1): Pt will complete UB dressing with min assist OT Short Term Goal 2 (Week 1): Pt will complete LB dressing with mod assist OT Short Term Goal 3 (Week 1): Pt will complete toilet transfer with supervision with necessary AD. OT Short Term Goal 4 (Week 1): Pt will complete walk-in shower transfer with min assist OT Short Term Goal 5 (Week 1): Pt will demonstrate use of LUE to assist in grooming tasks as gross assist level with min cues  Skilled Therapeutic Interventions/Progress Updates:  Engaged in simple cooking task and caregiver education with patient's son.  Patient ambulated to and from therapy kitchen with his son providing supervision to prepare a grilled cheese sandwich.  Patient's son provided verbal cues as did this OT related to safety on the hot stove.  Patient often trying to use his left hand to assist with flipping the sandwich and came very close to touching the hot pan several times and wanting to brush it off when we provided vcs related to our safety concerns.  Patient preferred to make excuses as to why he was having difficulty and why the sandwich fell apart and why he burned the sandwich.  Patient's son and this OT recommended to patient that the assistance he provides in the kitchen after discharge should not initially include using the hot stove or oven.   Therapy Documentation Precautions:  Precautions Precautions: Fall Precaution Comments: LUE weakness, fragile skin Restrictions Weight Bearing Restrictions: No Other Position/Activity Restrictions: Support LUE  Therapy/Group: Individual Therapy  Myrikal Messmer 10/16/2012, 4:44 PM

## 2012-10-16 NOTE — Progress Notes (Signed)
Occupational Therapy Session Note  Patient Details  Name: XIOMAR CROMPTON MRN: [id number] Date of Birth: 1927-03-23  Today's Date: 10/16/2012 Time: 6962-9528 Time Calculation (min): 55 min  Short Term Goals: Week 1:  OT Short Term Goal 1 (Week 1): Pt will complete UB dressing with min assist OT Short Term Goal 2 (Week 1): Pt will complete LB dressing with mod assist OT Short Term Goal 3 (Week 1): Pt will complete toilet transfer with supervision with necessary AD. OT Short Term Goal 4 (Week 1): Pt will complete walk-in shower transfer with min assist OT Short Term Goal 5 (Week 1): Pt will demonstrate use of LUE to assist in grooming tasks as gross assist level with min cues  Skilled Therapeutic Interventions/Progress Updates:    Pt completed ADL retraining at overall supervision level.  Supervision with functional mobility with SPC, transfers, and bathing and dressing with AE.  Pt demonstrated appropriate safety with above and demonstrated carryover of use of shoe buttons to assist in fastening shoes.  Pt continues to have decreased sensation and proprioception in LUE, requiring increased time with pulling up pants post toileting and with dressing.  Therapy Documentation Precautions:  Precautions Precautions: Fall Precaution Comments: LUE weakness, fragile skin Restrictions Weight Bearing Restrictions: No Other Position/Activity Restrictions: Support LUE General:   Vital Signs: Oxygen Therapy O2 Device: None (Room air) Pain: Pain Assessment Pain Assessment: No/denies pain  See FIM for current functional status  Therapy/Group: Individual Therapy  Leonette Monarch 10/16/2012, 10:27 AM

## 2012-10-17 ENCOUNTER — Inpatient Hospital Stay (HOSPITAL_COMMUNITY): Payer: Medicare Other | Admitting: Physical Therapy

## 2012-10-17 MED ORDER — METOPROLOL TARTRATE 25 MG PO TABS: 50.0000 mg | ORAL_TABLET | Freq: Two times a day (BID) | ORAL | Status: DC

## 2012-10-17 MED ORDER — APIXABAN 5 MG PO TABS: 5.0000 mg | ORAL_TABLET | Freq: Two times a day (BID) | ORAL | Status: DC

## 2012-10-17 MED ORDER — FUROSEMIDE 20 MG PO TABS: 10.0000 mg | ORAL_TABLET | Freq: Every day | ORAL | Status: DC

## 2012-10-17 MED ORDER — DSS 100 MG PO CAPS: 100.0000 mg | ORAL_CAPSULE | Freq: Every day | ORAL | Status: DC

## 2012-10-17 NOTE — Discharge Summary (Signed)
Physician Discharge Summary  Patient ID: Richard Chavez MRN: [id number] DOB/AGE: [birth year] 79 y.o.  Admit date: 10/08/2012 Discharge date: 10/17/2012  Discharge Diagnoses:  Principal Problem:   CVA (cerebral infarction) Active Problems:   Hypertension   COPD (chronic obstructive pulmonary disease)   Renal failure (ARF), acute on chronic   Chronic combined systolic and diastolic congestive heart failure   Possible reccuring Aspiration pneumonia, worsened by potential CVA   Atrial fibrillation with RVR   Discharged Condition: Good.   Significant Diagnostic Studies:  Labs:  Basic Metabolic Panel:  Recent Labs Lab 10/12/12 0710 10/13/12 0529  NA 139 138  K 4.2 4.1  CL 109 105  CO2 24 25  GLUCOSE 87 85  BUN 19 21  CREATININE 1.18 1.21  CALCIUM 8.5 8.5   CBC    Component Value Date/Time   WBC 5.9 10/09/2012 0510   RBC 4.37 10/09/2012 0510   HGB 12.4* 10/09/2012 0510   HCT 37.4* 10/09/2012 0510   PLT 160 10/09/2012 0510   MCV 85.6 10/09/2012 0510   MCH 28.4 10/09/2012 0510   MCHC 33.2 10/09/2012 0510   RDW 15.8* 10/09/2012 0510   LYMPHSABS 2.0 10/09/2012 0510   MONOABS 0.5 10/09/2012 0510   EOSABS 0.2 10/09/2012 0510   BASOSABS 0.0 10/09/2012 0510    Brief HPI:   Richard Chavez is a 79 y.o. male with history of GERD, COPD, CAD, advanced systolic and diastolic heart failure, recent admission for PNA who was readmitted on 10/01/12 with SOB, BLE edema as well as LUE weakness with numbness, confusion as well as difficulty walking. He was treated with IV diuretics for acute on chronic CHF with improvement in respiratory status and CT head negative for acute changes. Neurology felt patient with subcortical stroke and recommended plavix. Hospital course complicated by SBO as well as A Fib with RVR. NGT placed for decompression with good results. He was changed to eliquis and metoprolol increased to help with HR control. Therapies initiated and CIR recommended due to left sided weakness, distractibility  needing redirection as well as intermittent confusion.  Hospital Course: Richard Chavez was admitted to rehab 10/08/2012 for inpatient therapies to consist of PT, ST and OT at least three hours five days a week. Past admission physiatrist, therapy team and rehab RN have worked together to provide customized collaborative inpatient rehab. Blood pressures monitored on bid basis and have been reasonably controlled. Daily weights were done to monitor for recurrent overload and weight is down to 100.1 kg. Lasix was decreased to 10 mg per day due to reports of urge incontinence. Hypokalemia at admission was treated with increase in supplement. Respiratory status has improved and patient with improvement in activity level. He has made good progress during his stay and is at supervision level overall. Family education was done with son with emphasis on need for supervision due to safety concerns as well as driving restrictions untill cleared by MD.    Rehab course: During patient's stay in rehab weekly team conferences were held to monitor patient's progress, set goals and discuss barriers to discharge.  Dysphagia treatment has focused on utilization of safe swallow strategies and monitoring of diet tolerance. By 06/09, patient was tolerating D3 diet w/ thin liquids without signs or symptoms of aspiration and was able to utilize safe swallow strategies. Therefore speech therapy signed off.  Occupational therapy has focused on neuromuscular reeducation of LUE as well as ADL tasks and endurance. He requires supervision with increased time to complete  bathing and dressing tasks. He requires supervision for simple home management tasks due to distractibility as well as LUE weakness with sensory deficits. Physical therapy has focused on bed mobility, transfers, balance and strength. Patient is at supervision level for transfers and mobility. Family education was done with son with focus on supervision needed for safety. He will  continue to have follow up PT, OT and RN by Genevieve Norlander [facility] past discharge.    Disposition: 01-Home or Self Care   Diet: Cardiac diet, 2 gram salt restrictions.   Special Instructions: 1. Need to check weights daily. If weight starts going up by 2 lbs per day or have increase in shortness of breath or fluid retention---increase Lasix to 20 mg daily. 2. [facility] to provide PT,OT and RN.         Future Appointments Provider Department Dept Phone   10/29/2012 2:30 PM Marykay Lex, MD [facility] Ginette Otto [phone]   10/31/2012 10:45 AM [facility]rick Colace, [facility] [phone]       Medication List    STOP taking these medications       levofloxacin 750 MG tablet  Commonly known as:  LEVAQUIN      TAKE these medications       ADVAIR DISKUS 500-50 MCG/DOSE Aepb  Generic drug:  Fluticasone-Salmeterol  Inhale 1 puff into the lungs every 12 (twelve) hours.     apixaban 5 MG Tabs tablet  Commonly known as:  ELIQUIS  Take 1 tablet (5 mg total) by mouth 2 (two) times daily.     atorvastatin 20 MG tablet  Commonly known as:  LIPITOR  Take 20 mg by mouth daily with breakfast.     COMBIVENT 18-103 MCG/ACT inhaler  Generic drug:  albuterol-ipratropium  Inhale 2 puffs into the lungs 2 (two) times daily.     DSS 100 MG Caps  Take 100 mg by mouth daily.     Fish Oil 1000 MG Caps  Take 1 capsule by mouth daily.     fluticasone 50 MCG/ACT nasal spray  Commonly known as:  FLONASE  Place 2 sprays into the nose 2 (two) times daily as needed. For dryness.     furosemide 20 MG tablet  Commonly known as:  LASIX  Take 0.5 tablets (10 mg total) by mouth daily.     gabapentin 300 MG capsule  Commonly known as:  NEURONTIN  Take 600 mg by mouth 3 (three) times daily.     guaiFENesin 600 MG 12 hr tablet  Commonly known as:  MUCINEX  Take 1,200 mg by mouth 2 (two) times daily.     loratadine 10 MG tablet  Commonly  known as:  CLARITIN  Take 10 mg by mouth daily with breakfast.     metoprolol tartrate 25 MG tablet  Commonly known as:  LOPRESSOR  Take 2 tablets (50 mg total) by mouth 2 (two) times daily.     potassium chloride 10 MEQ tablet  Commonly known as:  K-DUR  Take 10 mEq by mouth 2 (two) times daily.     predniSONE 5 MG tablet  Commonly known as:  DELTASONE  Take 5 mg by mouth daily.     ranitidine 150 MG capsule  Commonly known as:  ZANTAC  Take 150 mg by mouth 2 (two) times daily.     SLOW-MAG 535 (64 MG) MG Tbcr  Generic drug:  Magnesium Chloride  Take 1 tablet by mouth 2 (two)  times daily.     tiotropium 18 MCG inhalation capsule  Commonly known as:  SPIRIVA  Place 18 mcg into inhaler and inhale daily with breakfast.     Vitamin D (Ergocalciferol) 50000 UNITS Caps  Commonly known as:  DRISDOL  Take 50,000 Units by mouth every 7 (seven) days. Take on Fridays     vitamin E 1000 UNIT capsule  Take 1,000 Units by mouth daily with breakfast.       Follow-up Information   Follow up with Erick Colace, MD On 10/31/2012. (Be there at 10:30 am for 10:34 appointment)    Contact information:   [address] [place] [address] [phone]       Follow up with Marykay Lex, MD. Call today. (for follow up in a couple of weeks)    Contact information:   [address] [place] [address] [phone]       Follow up with Gates Rigg, MD. Call today. (for follow up in 6 weeks.)    Contact information:   [address] [place] [address] [phone]       Follow up with Thayer Headings, MD On 10/21/2012. (APPT: 2:45 PM)    Contact information:   [address] [place] [address] [phone]       Signed: Jacquelynn Cree 10/17/2012, 3:35 PM

## 2012-10-17 NOTE — Plan of Care (Signed)
Problem: RH SKIN INTEGRITY Goal: RH STG MAINTAIN SKIN INTEGRITY WITH ASSISTANCE STG Maintain Skin Integrity With Assistance. Min assist  Outcome: Progressing Pt has skin tears to both upper arms  w/ 4x4 allevyn dressings in place

## 2012-10-17 NOTE — Plan of Care (Signed)
Problem: RH SKIN INTEGRITY Goal: RH STG SKIN FREE OF INFECTION/BREAKDOWN Pt will exhibit no new skin breakdown with min assist of care giver  Outcome: Progressing Pt has a skin tear w/ allevyn to both upper arms @ac 

## 2012-10-17 NOTE — Progress Notes (Signed)
At 1300 discharge instructions given to patient by Delle Reining, PA. Teaching done on going home instructions and safety. Patient taken down for pick-up. Sherlyn Lees, RN

## 2012-10-17 NOTE — Progress Notes (Signed)
Patient ID: Richard Chavez, male   DOB: June 25, 1926, 79 y.o.   MRN: [id number] Subjective/Complaints: 79 y.o. male with history of GERD, COPD, CAD, advanced systolic and diastolic heart failure, recent admission for PNA who was readmitted on 10/01/12 with SOB and BLE edema. He was noted to have LUE weakness with numbness and family reported confusion as well as difficulty walking. He was treated with IV diuretics for acute on chronic CHF with improvement in respiratory status and LE edema. CT head done per neurology input due to concerns of subcortical stroke. CT head without acute changes and stable mild atrophy with white matter ischemic changes. Carotid dopplers with right 40-59% ICA stenosis and L-60-79% ICA stenosis. He was changed to plavix per neurology recommendations. He reported problems with constipation and was noted to have SBO. Dr. Derrell Lolling consulted and recommended NGT to low suction. Atrial fibrillation with RVR treated with IV metoprolol and cardiology recommended changing patient to eliquis. Treated with IV vanc/ zosyn for potential aspiration PNA  Anxious about going home  Review of Systems  Gastrointestinal: Negative for nausea, vomiting and constipation.  Genitourinary: Negative for urgency.  Psychiatric/Behavioral: Negative for depression.  All other systems reviewed and are negative.     Objective: Vital Signs: Blood pressure 156/73, pulse 72, temperature 97.8 F (36.6 C), temperature source Oral, resp. rate 18, height 6\' 2"  (1.88 m), weight 100.1 kg (220 lb 10.9 oz), SpO2 97.00%. No results found. No results found for this or any previous visit (from the past 72 hour(s)).    Nursing note and vitals reviewed.  Constitutional: He is oriented to person, place, and time. He appears well-developed and well-nourished.  HENT:  Head: Normocephalic and atraumatic.  Eyes: Pupils are equal, round, and reactive to light.  Neck: Normal range of motion. Neck supple.  Cardiovascular:  Normal rate and regular rhythm.  Pulmonary/Chest: Effort normal. No respiratory distress. He has no wheezes.  Abdominal: He exhibits distension.  Round, decreased BS.  Musculoskeletal: He exhibits no edema.  Neurological: He is alert and oriented to person,hospital,. Needed cues for date, day of week. Tends to laugh through deficits but acknowledges them.  Mild dysarthria. Follows commands without difficulty. LUE 3- delt, 3 bicep,tricep, WE,HI are 3 to 3+/5. Marland Kitchen LLE grossly 3/5 HF, 3+ to 4- KE, 4/5 ADF. Decreased sensation bilateral feet in stocking glove distribution but otherwise non-focal. Right side grossly 4 to 5/5.  Skin: Skin is warm and dry. noted. Chronic vascular changes Psychiatric:   mood and affect are appropriate   Assessment/Plan: 1. Functional deficits secondary to Right subcortical stroke with left HP  Stable for D/C today F/u PCP in 1-2 weeks F/u PM&R 3 weeks See D/C summary See D/C instructions No driving FIM: FIM - Bathing Bathing Steps Patient Completed: Chest;Right Arm;Left Arm;Abdomen;Front perineal area;Buttocks;Right upper leg;Left upper leg;Right lower leg (including foot);Left lower leg (including foot) Bathing: 5: Supervision: Safety issues/verbal cues  FIM - Upper Body Dressing/Undressing Upper body dressing/undressing steps patient completed: Thread/unthread right sleeve of pullover shirt/dresss;Thread/unthread left sleeve of pullover shirt/dress;Put head through opening of pull over shirt/dress;Pull shirt over trunk Upper body dressing/undressing: 5: Supervision: Safety issues/verbal cues FIM - Lower Body Dressing/Undressing Lower body dressing/undressing steps patient completed: Thread/unthread right underwear leg;Thread/unthread left underwear leg;Pull underwear up/down;Thread/unthread right pants leg;Thread/unthread left pants leg;Pull pants up/down;Don/Doff left shoe;Don/Doff right shoe;Fasten/unfasten right shoe;Fasten/unfasten left shoe Lower body  dressing/undressing: 5: Supervision: Safety issues/verbal cues  FIM - Toileting Toileting steps completed by patient: Adjust clothing prior to toileting;Performs perineal hygiene;Adjust  clothing after toileting Toileting Assistive Devices: Grab bar or rail for support Toileting: 6: Assistive device: No helper  FIM - Diplomatic Services operational officer Devices: Grab bars;Cane Toilet Transfers: 5-To toilet/BSC: Supervision (verbal cues/safety issues);5-From toilet/BSC: Supervision (verbal cues/safety issues)  FIM - Banker Devices: Teacher, music: 6: Supine > Sit: No assist;6: Sit > Supine: No assist;5: Bed > Chair or W/C: Supervision (verbal cues/safety issues);5: Chair or W/C > Bed: Supervision (verbal cues/safety issues)  FIM - Locomotion: Wheelchair Distance: 150 Locomotion: Wheelchair: 0: Activity did not occur FIM - Locomotion: Ambulation Locomotion: Ambulation Assistive Devices: Emergency planning/management officer Ambulation/Gait Assistance: 5: Supervision Locomotion: Ambulation: 5: Travels 150 ft or more with supervision/safety issues  Comprehension Comprehension Mode: Auditory Comprehension: 6-Follows complex conversation/direction: With extra time/assistive device  Expression Expression Mode: Verbal Expression: 6-Expresses complex ideas: With extra time/assistive device  Social Interaction Social Interaction: 6-Interacts appropriately with others with medication or extra time (anti-anxiety, antidepressant).  Problem Solving Problem Solving: 6-Solves complex problems: With extra time  Memory Memory: 6-More than reasonable amt of time  Medical Problem List and Plan:  1. DVT Prophylaxis/Anticoagulation: Pharmaceutical: Other (comment)  2. Pain Management: tylenol, gabapentin for neuropathic pain  3. Mood: team to provide egosupport. 4. Neuropsych: This patient is  capable of making decisions on his own behalf.  5. Atrial  Fibrillation: Monitor HR with bid checks. Continues with tachycardia with activity--likely due to deconditioned. Will continue  6. Acute on chronic renal failure: Lasix discontinued. May need to stay dry to avoid recurrent exacerbation of CHF. Low salt diet. Check daily weights. Back on low dose lasix   7. Recent PNA resolved 8. COPD: Respiratory status improving. Continue combivent bid with spiriva and low dose oral steroids 9.  Mild HypoK , recheck BMET improved after supplement, now on lasix cont to monitor  LOS (Days) 9 A FACE TO FACE EVALUATION WAS PERFORMED  Marlane Hirschmann E 10/17/2012, 8:24 AM

## 2012-10-17 NOTE — Progress Notes (Signed)
Social Work Discharge Note Discharge Note  The overall goal for the admission was met for:   Discharge location: Yes-HOME WITH SON, WHO CAN PROVIDE 24 HR CARE  Length of Stay: Yes-9 DAYS  Discharge activity level: Yes-SUPERVISION LEVEL  Home/community participation: Yes  Services provided included: MD, RD, PT, OT, RN, TR, Pharmacy and SW  Financial Services: Medicare and Private Insurance: [facility]  Follow-up services arranged: Home Health: [facility]-PT,OT,RN and Patient/Family has no preference for HH/DME agencies  Comments (or additional information):FAMILY EDUCATION COMPLETED YESTERDAY-BOTH COMFORTABLE WITH HIS CARE AT HOME  Patient/Family verbalized understanding of follow-up arrangements: Yes  Individual responsible for coordination of the follow-up plan: SELF & [name]-SON  Confirmed correct DME delivered: Lucy Chris 10/17/2012    Vesta Wheeland, Lemar Livings

## 2012-10-20 ENCOUNTER — Telehealth: Payer: Self-pay

## 2012-10-20 NOTE — Telephone Encounter (Signed)
Patient called to clarify appointment.  Tried to contact patient but number just kept ringing.

## 2012-10-29 ENCOUNTER — Ambulatory Visit (INDEPENDENT_AMBULATORY_CARE_PROVIDER_SITE_OTHER): Payer: Medicare Other | Admitting: Cardiology

## 2012-10-29 ENCOUNTER — Telehealth: Payer: Self-pay | Admitting: Neurology

## 2012-10-29 ENCOUNTER — Encounter: Payer: Self-pay | Admitting: Cardiology

## 2012-10-29 VITALS — BP 116/78 | HR 76 | Ht 74.0 in | Wt 235.1 lb

## 2012-10-29 DIAGNOSIS — I4891 Unspecified atrial fibrillation: Secondary | ICD-10-CM

## 2012-10-29 DIAGNOSIS — I509 Heart failure, unspecified: Secondary | ICD-10-CM

## 2012-10-29 DIAGNOSIS — I6350 Cerebral infarction due to unspecified occlusion or stenosis of unspecified cerebral artery: Secondary | ICD-10-CM

## 2012-10-29 DIAGNOSIS — I480 Paroxysmal atrial fibrillation: Secondary | ICD-10-CM

## 2012-10-29 DIAGNOSIS — Z950 Presence of cardiac pacemaker: Secondary | ICD-10-CM

## 2012-10-29 DIAGNOSIS — R600 Localized edema: Secondary | ICD-10-CM

## 2012-10-29 DIAGNOSIS — R609 Edema, unspecified: Secondary | ICD-10-CM

## 2012-10-29 DIAGNOSIS — I25810 Atherosclerosis of coronary artery bypass graft(s) without angina pectoris: Secondary | ICD-10-CM

## 2012-10-29 DIAGNOSIS — I472 Ventricular tachycardia: Secondary | ICD-10-CM

## 2012-10-29 DIAGNOSIS — E7800 Pure hypercholesterolemia, unspecified: Secondary | ICD-10-CM

## 2012-10-29 DIAGNOSIS — R55 Syncope and collapse: Secondary | ICD-10-CM

## 2012-10-29 DIAGNOSIS — I10 Essential (primary) hypertension: Secondary | ICD-10-CM

## 2012-10-29 DIAGNOSIS — I5042 Chronic combined systolic (congestive) and diastolic (congestive) heart failure: Secondary | ICD-10-CM

## 2012-10-29 DIAGNOSIS — I639 Cerebral infarction, unspecified: Secondary | ICD-10-CM

## 2012-10-29 MED ORDER — APIXABAN 5 MG PO TABS: 5.0000 mg | ORAL_TABLET | Freq: Two times a day (BID) | ORAL | Status: DC

## 2012-10-29 NOTE — Patient Instructions (Addendum)
I am very happy to see how well you're doing at your stroke. Unfortunately we found out a little but too late that you actually do have atrial fibrillation. But currently he seemed to do well with that on the metoprolol. We have you on a medicine called Eliquis which we will refill for U today as a blood thinner to prevent further strokes. Continue your other medications as ordered and we'll see back in 3 months to recheck her cholesterol and other cardiovascular checks.  Marykay Lex, MD

## 2012-10-31 ENCOUNTER — Encounter: Payer: Medicare Other | Attending: Physical Medicine & Rehabilitation

## 2012-10-31 ENCOUNTER — Encounter: Payer: Self-pay | Admitting: Physical Medicine & Rehabilitation

## 2012-10-31 ENCOUNTER — Ambulatory Visit (HOSPITAL_BASED_OUTPATIENT_CLINIC_OR_DEPARTMENT_OTHER): Payer: Medicare Other | Admitting: Physical Medicine & Rehabilitation

## 2012-10-31 VITALS — BP 121/69 | HR 70 | Resp 14 | Ht 74.0 in | Wt 232.2 lb

## 2012-10-31 DIAGNOSIS — K219 Gastro-esophageal reflux disease without esophagitis: Secondary | ICD-10-CM | POA: Insufficient documentation

## 2012-10-31 DIAGNOSIS — G8110 Spastic hemiplegia affecting unspecified side: Secondary | ICD-10-CM

## 2012-10-31 DIAGNOSIS — J449 Chronic obstructive pulmonary disease, unspecified: Secondary | ICD-10-CM | POA: Insufficient documentation

## 2012-10-31 DIAGNOSIS — I69959 Hemiplegia and hemiparesis following unspecified cerebrovascular disease affecting unspecified side: Secondary | ICD-10-CM | POA: Insufficient documentation

## 2012-10-31 DIAGNOSIS — I10 Essential (primary) hypertension: Secondary | ICD-10-CM | POA: Insufficient documentation

## 2012-10-31 DIAGNOSIS — I252 Old myocardial infarction: Secondary | ICD-10-CM | POA: Insufficient documentation

## 2012-10-31 DIAGNOSIS — Z79899 Other long term (current) drug therapy: Secondary | ICD-10-CM | POA: Insufficient documentation

## 2012-10-31 DIAGNOSIS — Z7902 Long term (current) use of antithrombotics/antiplatelets: Secondary | ICD-10-CM | POA: Insufficient documentation

## 2012-10-31 DIAGNOSIS — J4489 Other specified chronic obstructive pulmonary disease: Secondary | ICD-10-CM | POA: Insufficient documentation

## 2012-10-31 DIAGNOSIS — E785 Hyperlipidemia, unspecified: Secondary | ICD-10-CM | POA: Insufficient documentation

## 2012-10-31 DIAGNOSIS — Z951 Presence of aortocoronary bypass graft: Secondary | ICD-10-CM | POA: Insufficient documentation

## 2012-10-31 DIAGNOSIS — I2510 Atherosclerotic heart disease of native coronary artery without angina pectoris: Secondary | ICD-10-CM | POA: Insufficient documentation

## 2012-10-31 NOTE — Progress Notes (Signed)
Subjective:    Patient ID: Richard Chavez, male    DOB: [birth year], 79 y.o.   MRN: [id number]  HPI Richard Chavez is a 79 y.o. male with history of GERD, COPD, CAD, advanced systolic and diastolic heart failure, recent admission for PNA who was readmitted on 10/01/12 with SOB, BLE edema as well as LUE weakness with numbness, confusion as well as difficulty walking. He was treated with IV diuretics for acute on chronic CHF with improvement in respiratory status and CT head negative for acute changes. Neurology felt patient with subcortical stroke and recommended plavix. Hospital course complicated by SBO as well as A Fib with RVR. NGT placed for decompression with good results. He was changed to eliquis and metoprolol increased to help with HR control. Therapies initiated and CIR recommended due to left sided weakness, distractibility needing redirection as well as intermittent confusion.  HHPT and OT and RN Left handed Saw cardiologist and PCP since D/C Pain Inventory Average Pain 0 Pain Right Now 0 My pain is no pain  In the last 24 hours, has pain interfered with the following? General activity 0 Relation with others 0 Enjoyment of life 0 What TIME of day is your pain at its worst? no pain Sleep (in general) Good  Pain is worse with: no pain Pain improves with: no pain Relief from Meds: no pain  Mobility use a cane ability to climb steps?  yes  Function I need assistance with the following:  dressing and household duties  Neuro/Psych numbness  Prior Studies Any changes since last visit?  no  Physicians involved in your care Any changes since last visit?  no   Family History  Problem Relation Age of Onset  . Leukemia Father   . Heart disease Father   . Alzheimer's disease Mother   . Arthritis Mother   . Lung cancer Son 22  . Alzheimer's disease Maternal Grandmother    History   Social History  . Marital Status: Widowed    Spouse Name: N/A    Number of Children:  N/A  . Years of Education: N/A   Occupational History  . RETIRED [employer]   Social History Main Topics  . Smoking status: Former Smoker -- 0.30 packs/day for 15 years    Types: Cigarettes    Quit date: 05/07/1985  . Smokeless tobacco: Never Used  . Alcohol Use: No  . Drug Use: No  . Sexually Active: No   Other Topics Concern  . None   Social History Narrative   Patient's son Richard Chavez lives with him at home.   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 Pacemaker placement  10-08-05  . Cholecystectomy  09-Oct-2003  . Coronary artery bypass graft  1978  . Lesion excision      from penis  . Cardiac catheterization  06/06/2011    occluded proximal RCA, ostial LAD and mid Circumflex after OM 1.; [place] LIMA-LAD, patent SVG-OM1-OM 2, patent SVG-RCA. Reduced ejection fraction of 30-35%.  . Cardiac catheterization  06/14/2010    LIMA to LAD, SVG to RCA, SVG to OM1 Circumflex, 100% occluded RCA, 100% occluded circumfkex, 100% occluded LAD, no evidence  of graft dysfunction, continue medical therapy  . Cardiac catheterization  04/12/2003    3 vessel coronary artery disease, continue medical treatment  . Cardiac catheterization  10/07/2000    Severe 3 vessel coronary artery disease, continue medical treatment   Past Medical History  Diagnosis Date  . CAD (coronary artery disease), native coronary artery     Status post CABG  . Hypertension   . Hyperlipidemia   . GERD (gastroesophageal reflux disease)   . COPD (chronic obstructive pulmonary disease)   . CRF (chronic renal failure)   . Barrett's esophagus   . OP (osteoporosis)   . CAD (coronary artery disease) of artery bypass graft 07/04/2011    LIMA-LAD; SVG-OM2-OM3,  SVG-RCA -- all patent as of 05/27/11  . Syncope 07/04/2011  . Pacemaker 07/04/2011  . Cardiomyopathy, ischemic 07/07/2011    2D Echo - EF 35-40, moderately dilated left atrium  . Myocardial infarction     25 yrs ago  . Peripheral vascular disease   . Arthritis   . BPH (benign prostatic hyperplasia)   . Polyp of colon     removed  . Sleep apnea     STOP BANG SCORE 4  . AV block, 3rd degree     Post St. Jude pacemaker, EF 35-40, does have intermittent atrial tachycardia, either PAt or Afib  . BPH (benign prostatic hypertrophy) 11/15/2011  . SOB (shortness of breath) on exertion 02/14/2010    2D Echo - EF 35-45,    BP 121/69  Pulse 70  Resp 14  Ht 6\' 2"  (1.88 m)  Wt 232 lb 3.2 oz (105.325 kg)  BMI 29.8 kg/m2  SpO2 97%    Review of Systems  Cardiovascular: Positive for leg swelling.  Neurological: Positive for numbness.  All other systems reviewed and are negative.       Objective:   Physical Exam  3-/5 strength L Delt,Bi,TRI,grip 4/5 In LLE  5/5 on R       Assessment & Plan:  1. Right subcortical infarct with left upper greater than left lower extremity weakness. Continue outpatient therapy. Return to clinic one month to monitor progress.

## 2012-11-09 ENCOUNTER — Encounter: Payer: Self-pay | Admitting: Cardiology

## 2012-11-09 NOTE — Assessment & Plan Note (Signed)
On statin. Would be due for check at next followup visit.

## 2012-11-09 NOTE — Assessment & Plan Note (Signed)
Having any active heart symptoms. He's on this standing dose of Lasix. We talked about sliding scale Lasix.  I believe his current weight at 235 pounds is a pretty good dry weight. His instructions are Sliding scale Lasix: Weigh yourself when you get home, then Daily in the Morning. Your dry weight will be what your scale says on the day you return home.(here is 235 lbs.).   If you gain more than 3 pounds from dry weight: Increase the Lasix dosing to 20 mg mg in the morning and 20 mg in the afternoon until weight returns to baseline dry weight.  If weight gain is greater than 5 pounds in 2 days: Increased to Lasix 40 mg twice a day and contact the office for further assistance if weight does not go down the next day.  If the weight goes down more than 3 pounds from dry weight: Hold Lasix until it returns to baseline dry weight

## 2012-11-09 NOTE — Assessment & Plan Note (Addendum)
Unfortunately, this was diagnosed following his stroke. I was in the sense that he may have paroxysmal atrial fibrillation, but could never show up pacemaker interrogation. He is currently anticoagulated with Eliquis. He does have mild bruising but no significant bleeding. Rate controlled well on current dose of beta blocker, with no further episodes of nonsustained ventricular tachycardia noted.

## 2012-11-09 NOTE — Assessment & Plan Note (Addendum)
He seems to be recovering quite well with improvement of left arm function - . More so than expected, and continuing rehabilitation . Currently on Eliquis and tolerating it well.

## 2012-11-09 NOTE — Progress Notes (Signed)
Patient ID: Richard Chavez, male   DOB: 08-11-1926, 79 y.o.   MRN: [id number]  Clinic Note: HPI: Richard Chavez is a 79 y.o. male with a PMH below who presents today for hospital followup from a right-sided stroke. He is a patient very well known to me. He has coronary disease status post CABG has had pacemaker placed for complete heart block. He has had some episodes of syncope with hyponatremia in the past. He has also had bowel instructions. At his most recent admission was last month when he came in with a right-sided stroke with left-sided weakness. Interrogation of his pacemaker revealed that he had gone into atrial fibrillation a couple times prior to admission. He had runs of rapid ventricular rate while in the hospital, thought to be due to beta blocker withdrawal while he was suffering from a small bowel obstruction. He had some mild diastolic heart failure as a result of this, but was not in significant distress from that standpoint. His bowel junction resolved without an operation, and his stroke symptoms/weakness improved. He had essentially no neglect of the left hand and would not move the entire arm. He went to acute rehabilitation for a short period of time. He is now here for routine followup post hospitalization. He was discharged on Eliquis (apixaban) for anticoagulation.  Interval History:  since his discharge from the hospital his had a little edema but been doing relatively well he is pretty much euvolemic on discharge. His been recovering relatively well he has more movement of his left arm now manic actually grasp some of his left hand. He's not having any real heart failure symptoms to speak of. He does not note being in atrial fibrillation no sense of palpitations or rapid heart rate. He has had no PND, orthopnea or edema. No TIA or amaurosis fugax symptoms since his stroke. No melena, hematochezia or hematuria. No chest pain or shortness of breath with activities doing or at  rest. Overall he seems pretty happy with his progression, but is still quite upset about his stroke.  He is accompanied today by his son, [name], who sees a much more involved with his care over last year or so.  Past Medical History  Diagnosis Date  . CAD (coronary artery disease), native coronary artery     Status post CABG  . Hypertension   . Hyperlipidemia   . GERD (gastroesophageal reflux disease)   . COPD (chronic obstructive pulmonary disease)   . CRF (chronic renal failure)   . Barrett's esophagus   . OP (osteoporosis)   . CAD (coronary artery disease) of artery bypass graft 07/04/2011    LIMA-LAD; SVG-OM2-OM3, SVG-RCA -- all patent as of 05/27/11  . Syncope 07/04/2011  . Pacemaker 07/04/2011  . Cardiomyopathy, ischemic 07/07/2011    2D Echo - EF 35-40, moderately dilated left atrium  . Myocardial infarction     25 yrs ago  . Peripheral vascular disease   . Arthritis   . BPH (benign prostatic hyperplasia)   . Polyp of colon     removed  . Sleep apnea     STOP BANG SCORE 4  . AV block, 3rd degree     Post St. Jude pacemaker, EF 35-40, does have intermittent atrial tachycardia, either PAt or Afib  . BPH (benign prostatic hypertrophy) 11/15/2011  . SOB (shortness of breath) on exertion 02/14/2010    2D Echo - EF 35-45,   . Paroxysmal atrial fibrillation - now rate controlled (formally with  [facility]) 10/03/2012  . CVA (cerebral infarction) 10/09/2012    Noted to be in atrial fibrillation with evaluation of pacemaker, prior to CVA. Right-sided MCA stroke with left arm weakness/mild paralysis -- notably improved     Prior Cardiac Evaluation and Past Surgical History: Past Surgical History  Procedure Laterality Date  . Pacemaker placement  2007  . Cholecystectomy  2005  . Coronary artery bypass graft  1978    X3  . Lesion excision      from penis  . Cardiac catheterization  06/06/2011    occluded proximal RCA, ostial LAD and mid Circumflex after OM 1.; [place] LIMA-LAD,  patent SVG-OM1-OM 2, patent SVG-RCA. Reduced ejection fraction of 30-35%.  . Cardiac catheterization  06/14/2010    LIMA to LAD, SVG to RCA, SVG to OM1 Circumflex, 100% occluded RCA, 100% occluded circumfkex, 100% occluded LAD, no evidence of graft dysfunction, continue medical therapy  . Cardiac catheterization  04/12/2003    3 vessel coronary artery disease, continue medical treatment  . Cardiac catheterization  10/07/2000    Severe 3 vessel coronary artery disease, continue medical treatment  . Doppler echocardiography  10/02/2012    EF 40-45%; possible grade 1 diastolic dysfunction, but A. fib precludes this. Mild atrial dilatation bilaterally. Moderately elevated pulmonary pressures of 40 mmHg.    No Known Allergies  Current Outpatient Prescriptions  Medication Sig Dispense Refill  . albuterol-ipratropium (COMBIVENT) 18-103 MCG/ACT inhaler Inhale 2 puffs into the lungs 2 (two) times daily.       Marland Kitchen apixaban (ELIQUIS) 5 MG TABS tablet Take 1 tablet (5 mg total) by mouth 2 (two) times daily.  180 tablet  3  . atorvastatin (LIPITOR) 20 MG tablet Take 20 mg by mouth daily with breakfast.       . docusate sodium 100 MG CAPS Take 100 mg by mouth daily.  10 capsule  0  . fluticasone (FLONASE) 50 MCG/ACT nasal spray Place 2 sprays into the nose 2 (two) times daily as needed. For dryness.      . Fluticasone-Salmeterol (ADVAIR DISKUS) 500-50 MCG/DOSE AEPB Inhale 1 puff into the lungs every 12 (twelve) hours.       . furosemide (LASIX) 20 MG tablet Take 20 mg by mouth daily.      Marland Kitchen gabapentin (NEURONTIN) 300 MG capsule Take 600 mg by mouth 3 (three) times daily.       Marland Kitchen guaiFENesin (MUCINEX) 600 MG 12 hr tablet Take 1,200 mg by mouth 2 (two) times daily.      Marland Kitchen loratadine (CLARITIN) 10 MG tablet Take 10 mg by mouth daily with breakfast.       . Magnesium Chloride (SLOW-MAG) 535 (64 MG) MG TBCR Take 1 tablet by mouth 2 (two) times daily.       . metoprolol (LOPRESSOR) 50 MG tablet Take 50 mg by mouth 2  (two) times daily.      . Omega-3 Fatty Acids (FISH OIL) 1000 MG CAPS Take 1 capsule by mouth daily.       . potassium chloride (K-DUR) 10 MEQ tablet Take 10 mEq by mouth 2 (two) times daily.       . predniSONE (DELTASONE) 5 MG tablet Take 5 mg by mouth daily.      . ranitidine (ZANTAC) 150 MG capsule Take 150 mg by mouth 2 (two) times daily.       Marland Kitchen tiotropium (SPIRIVA) 18 MCG inhalation capsule Place 18 mcg into inhaler and inhale daily with breakfast.       .  Vitamin D, Ergocalciferol, (DRISDOL) 50000 UNITS CAPS Take 50,000 Units by mouth every 7 (seven) days. Take on Fridays      . vitamin E 1000 UNIT capsule Take 1,000 Units by mouth daily with breakfast.        No current facility-administered medications for this visit.    History   Social History  . Marital Status: Widowed    Spouse Name: N/A    Number of Children: N/A  . Years of Education: N/A   Occupational History  . RETIRED [employer]   Social History Main Topics  . Smoking status: Former Smoker -- 0.30 packs/day for 15 years    Types: Cigarettes    Quit date: 05/07/1985  . Smokeless tobacco: Never Used  . Alcohol Use: No  . Drug Use: No  . Sexually Active: No   Social History Narrative   Patient's son Elijahjames Fuelling lives with him at home.   Patient's wife deceased in Sep 29, 1994 from lung cancer   Patient used to smoke until 09/29/1986 when he had his first bypass surgery   He smoked one pack per day   His father had some heart disease.   ROS: A comprehensive Review of Systems - Negative except Pertinent positives above. Neurologic symptoms noted below. Mild numbing in the left leg but minimal weakness. Left arm has diminished movement and has decreased sensation. He has minimal hand squeeze maybe 2/5 strength. This is dramatically improved from his last inpatient examination.   PHYSICAL EXAM BP 116/78  Pulse 76  Ht 6\' 2"  (1.88 m)  Wt 235 lb 1.6 oz (106.641 kg)  BMI 30.17 kg/m2 General appearance: alert,  cooperative, appears stated age, no distress, mildly obese and Somewhat hard of hearing. And he has a hard time comprehending some some of what we are saying. Neck: no carotid bruit, no JVD and supple, symmetrical, trachea midline Lungs: clear to auscultation bilaterally, normal percussion bilaterally and With only mildly decreased bibasal breath sounds. Mild interstitial sounds but no W./R./R. Heart: normal apical impulse, regular rate and rhythm, S1, S2 normal and No murmurs, rubs or gallops Abdomen: soft, non-tender; bowel sounds normal; no masses,  no organomegaly and Obese Extremities: edema Trace bilateral, no ulcers, gangrene or trophic changes and venous stasis dermatitis noted Pulses: 2+ and symmetric Neurologic: Mental status: Alert, oriented, thought content appropriate, affect: normal Cranial nerves: normal HEENT: [place]/AT, EOMI, MMM, anicteric sclera; he has puffy bags under his eyes.  [id number] today: Yes Rate:76  , Rhythm:  atrial fibrillation with atrial pacing, the sensing.  Left Anterior Fascicular Block, PVC  Recent Labs: none  ASSESSMENT / PLAN:  recovery from his stroke. Relatively stable cardiac standpoint. Has proven not to deathly have chronic paroxysmal atrial fibrillation is persistent now.  Paroxysmal atrial fibrillation - now rate controlled (formally with [facility]) Unfortunately, this was diagnosed following his stroke. I was in the sense that he may have paroxysmal atrial fibrillation, but could never show up pacemaker interrogation. He is currently anticoagulated with Eliquis. He does have mild bruising but no significant bleeding. Rate controlled well on current dose of beta blocker, with no further episodes of nonsustained ventricular tachycardia noted.  Chronic combined systolic and diastolic congestive heart failure Having any active heart symptoms. He's on this standing dose of Lasix. We talked about sliding scale Lasix.  I believe his current  weight at 235 pounds is a pretty good dry weight. His instructions are Sliding scale Lasix: Weigh yourself when you get home, then Daily  in the Morning. Your dry weight will be what your scale says on the day you return home.(here is 235 lbs.).   If you gain more than 3 pounds from dry weight: Increase the Lasix dosing to 20 mg mg in the morning and 20 mg in the afternoon until weight returns to baseline dry weight.  If weight gain is greater than 5 pounds in 2 days: Increased to Lasix 40 mg twice a day and contact the office for further assistance if weight does not go down the next day.  If the weight goes down more than 3 pounds from dry weight: Hold Lasix until it returns to baseline dry weight    CAD CABG 1998, patent grafts 06/06/11 after abn Myoview No active anginal symptoms. He had a relatively recent catheterization with essentially nonobstructive/nonvascularized will small vessel coronary disease. He has baseline Ischemic Cardiomyopathy, but His EF by Recent Echo Was Now in the Range of 40-45%. He does not have any active heart failure symptoms besides mild edema.  Hypertension Stable blood pressure on current regimen.  Hypercholesteremia On statin. Would be due for check at next followup visit.  CVA (cerebral infarction) He seems to be recovering quite well with improvement of left arm function - . More so than expected, and continuing rehabilitation . Currently on Eliquis and tolerating it well.  Edema of both legs Stable. On standing dose of Lasix. It considered in the past doing grocery Venous Dopplers looking for Venous Insufficiency. I will put this on hold for little while to recover from a stroke completely.  Syncope, admitted with dehydration, hypotension, acute renalinsufficency. He's not had any further syncopal episodes. For this reason I don't be at too high a dose of standing Lasix.    Orders Placed This Encounter  Procedures  . EKG 12-Lead    Followup:   3 months  @[facility] @, [facility] [address] [place], [place]  [address]  [phone] Pager # [phone]   '

## 2012-11-09 NOTE — Assessment & Plan Note (Signed)
Stable blood pressure on current regimen. 

## 2012-11-09 NOTE — Assessment & Plan Note (Signed)
He's not had any further syncopal episodes. For this reason I don't be at too high a dose of standing Lasix.

## 2012-11-09 NOTE — Assessment & Plan Note (Addendum)
Stable. On standing dose of Lasix. It considered in the past doing grocery Venous Dopplers looking for Venous Insufficiency. I will put this on hold for little while to recover from a stroke completely.

## 2012-11-09 NOTE — Assessment & Plan Note (Addendum)
No active anginal symptoms. He had a relatively recent catheterization with essentially nonobstructive/nonvascularized will small vessel coronary disease. He has baseline Ischemic Cardiomyopathy, but His EF by Recent Echo Was Now in the Range of 40-45%. He does not have any active heart failure symptoms besides mild edema.

## 2012-12-02 ENCOUNTER — Encounter: Payer: Self-pay | Admitting: Physical Medicine and Rehabilitation

## 2012-12-02 ENCOUNTER — Encounter
Payer: Medicare Other | Attending: Physical Medicine and Rehabilitation | Admitting: Physical Medicine and Rehabilitation

## 2012-12-02 ENCOUNTER — Ambulatory Visit: Payer: Medicare Other | Admitting: Physical Medicine & Rehabilitation

## 2012-12-02 VITALS — BP 125/85 | HR 65 | Resp 16 | Ht 74.0 in | Wt 240.0 lb

## 2012-12-02 DIAGNOSIS — I639 Cerebral infarction, unspecified: Secondary | ICD-10-CM

## 2012-12-02 DIAGNOSIS — I6350 Cerebral infarction due to unspecified occlusion or stenosis of unspecified cerebral artery: Secondary | ICD-10-CM

## 2012-12-02 DIAGNOSIS — I10 Essential (primary) hypertension: Secondary | ICD-10-CM | POA: Insufficient documentation

## 2012-12-02 DIAGNOSIS — Z951 Presence of aortocoronary bypass graft: Secondary | ICD-10-CM | POA: Insufficient documentation

## 2012-12-02 DIAGNOSIS — R209 Unspecified disturbances of skin sensation: Secondary | ICD-10-CM | POA: Insufficient documentation

## 2012-12-02 DIAGNOSIS — I69959 Hemiplegia and hemiparesis following unspecified cerebrovascular disease affecting unspecified side: Secondary | ICD-10-CM | POA: Insufficient documentation

## 2012-12-02 DIAGNOSIS — I69998 Other sequelae following unspecified cerebrovascular disease: Secondary | ICD-10-CM | POA: Insufficient documentation

## 2012-12-02 DIAGNOSIS — I2510 Atherosclerotic heart disease of native coronary artery without angina pectoris: Secondary | ICD-10-CM | POA: Insufficient documentation

## 2012-12-02 NOTE — Progress Notes (Signed)
Subjective:    Patient ID: Richard Chavez, male    DOB: [birth year], 79 y.o.   MRN: [id number]  HPI Richard Chavez is a 79 y.o. left handed male with history of a right subcortical stroke with left HP, on 10/01/12. He is doing considerably well. He states that his walking is pretty stable, he did not have a recent fall. He only complains about some weakness and difficulty controlling of his LUE. He lives with his son, but is independent with the activities of daily life. He has 3 more HH OT visits, then his OT would recommend outpatient OT. The patient reports that he has an appointment with Dr. Pearlean Chavez in September.      Pain Inventory  Average Pain 0  Pain Right Now 0  My pain is no pain  In the last 24 hours, has pain interfered with the following?  General activity 0  Relation with others 0  Enjoyment of life 0  What TIME of day is your pain at its worst? no pain  Sleep (in general) Good  Pain is worse with: no pain  Pain improves with: no pain  Relief from Meds: no pain  Mobility  use a cane  ability to climb steps? yes  Function  I need assistance with the following: dressing and household duties  Neuro/Psych  numbness  Prior Studies  Any changes since last visit? no  Physicians involved in your care  Any changes since last visit? no   Family History  Problem Relation Age of Onset  . Leukemia Father   . Heart disease Father   . Alzheimer's disease Mother   . Arthritis Mother   . Lung cancer Son 10  . Alzheimer's disease Maternal Grandmother    History   Social History  . Marital Status: Widowed    Spouse Name: N/A    Number of Children: N/A  . Years of Education: N/A   Occupational History  . RETIRED [employer]   Social History Main Topics  . Smoking status: Former Smoker -- 0.30 packs/day for 15 years    Types: Cigarettes    Quit date: 05/07/1985  . Smokeless tobacco: Never Used  . Alcohol Use: No  . Drug Use: No  . Sexually Active: No    Other Topics Concern  . None   Social History Narrative   Patient's son Richard Chavez lives with him at home.   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   Past Surgical History  Procedure Laterality Date  . Pacemaker placement  Sep 29, 2005  . Cholecystectomy  2003-09-30  . Coronary artery bypass graft  1978    X3  . Lesion excision      from penis  . Cardiac catheterization  06/06/2011    occluded proximal RCA, ostial LAD and mid Circumflex after OM 1.; [place] LIMA-LAD, patent SVG-OM1-OM 2, patent SVG-RCA. Reduced ejection fraction of 30-35%.  . Cardiac catheterization  06/14/2010    LIMA to LAD, SVG to RCA, SVG to OM1 Circumflex, 100% occluded RCA, 100% occluded circumfkex, 100% occluded LAD, no evidence of graft dysfunction, continue medical therapy  . Cardiac catheterization  04/12/2003    3 vessel coronary artery disease, continue medical treatment  . Cardiac  catheterization  10/07/2000    Severe 3 vessel coronary artery disease, continue medical treatment  . Doppler echocardiography  10/02/2012    EF 40-45%; possible grade 1 diastolic dysfunction, but A. fib precludes this. Mild atrial dilatation bilaterally. Moderately elevated pulmonary pressures of 40 mmHg.   Past Medical History  Diagnosis Date  . CAD (coronary artery disease), native coronary artery     Status post CABG  . Hypertension   . Hyperlipidemia   . GERD (gastroesophageal reflux disease)   . COPD (chronic obstructive pulmonary disease)   . CRF (chronic renal failure)   . Barrett's esophagus   . OP (osteoporosis)   . CAD (coronary artery disease) of artery bypass graft 07/04/2011    LIMA-LAD; SVG-OM2-OM3, SVG-RCA -- all patent as of 05/27/11  . Syncope 07/04/2011  . Pacemaker 07/04/2011  . Cardiomyopathy, ischemic  07/07/2011    2D Echo - EF 35-40, moderately dilated left atrium  . Myocardial infarction     25 yrs ago  . Peripheral vascular disease   . Arthritis   . BPH (benign prostatic hyperplasia)   . Polyp of colon     removed  . Sleep apnea     STOP BANG SCORE 4  . AV block, 3rd degree     Post St. Jude pacemaker, EF 35-40, does have intermittent atrial tachycardia, either PAt or Afib  . BPH (benign prostatic hypertrophy) 11/15/2011  . SOB (shortness of breath) on exertion 02/14/2010    2D Echo - EF 35-45,   . Paroxysmal atrial fibrillation - now rate controlled (formally with [facility] and [facility]) 10/03/2012  . CVA (cerebral infarction) 10/09/2012    Noted to be in atrial fibrillation with evaluation of pacemaker, prior to CVA. Right-sided MCA stroke with left arm weakness/mild paralysis -- notably improved    BP 125/85  Pulse 65  Resp 16  Ht 6\' 2"  (1.88 m)  Wt 240 lb (108.863 kg)  BMI 30.8 kg/m2  SpO2 96%     Review of Systems  Neurological: Positive for numbness.  All other systems reviewed and are negative.       Objective:   Physical Exam  Constitutional: He is oriented to person, place, and time. He appears well-developed and well-nourished.  HENT:  Head: Normocephalic.  Neck: Neck supple.  Neurological: He is alert and oriented to person, place, and time.  Skin: Skin is warm and dry.  Psychiatric: He has a normal mood and affect.    Symmetric normal motor tone is noted throughout. Normal muscle bulk. Muscle testing reveals 5/5 muscle strength of the upper extremityon the right, 4/5 of the left UE, 3+ /5 of his left hand muscles, and 5/5 of the lower extremity on the right, 4+/5 on the left LE. Full range of motion in upper and lower extremities.  Fine motor movements are a little slower on the left.  DTR in the upper and lower extremity are present and symmetric 2+. No clonus is noted.  Patient arises from chair with mild difficulty. Wide based gait with normal arm swing  bilateral .No pronator drift.          Assessment & Plan:  1.  Right subcortical stroke, on 10/01/12, with left HP UE more effected than LLE.Improving well. PLAN Ordered outpatient OT, to improve function of LUE, and fine motor skills. Patient should continue with his walking and exercise program at home as tolerated and as it is safe. He should follow up with Dr. Pearlean Chavez. Follow up with [place]  in 2 month.

## 2012-12-02 NOTE — Patient Instructions (Signed)
Continue with staying as active as tolerated and as it is safe.

## 2012-12-10 ENCOUNTER — Ambulatory Visit: Payer: Medicare Other | Attending: Physical Medicine and Rehabilitation | Admitting: Occupational Therapy

## 2012-12-10 DIAGNOSIS — M6281 Muscle weakness (generalized): Secondary | ICD-10-CM | POA: Insufficient documentation

## 2012-12-10 DIAGNOSIS — IMO0001 Reserved for inherently not codable concepts without codable children: Secondary | ICD-10-CM | POA: Insufficient documentation

## 2012-12-10 DIAGNOSIS — R279 Unspecified lack of coordination: Secondary | ICD-10-CM | POA: Insufficient documentation

## 2012-12-17 ENCOUNTER — Ambulatory Visit: Payer: Medicare Other | Admitting: Occupational Therapy

## 2012-12-23 ENCOUNTER — Ambulatory Visit: Payer: Medicare Other | Admitting: Occupational Therapy

## 2012-12-25 ENCOUNTER — Ambulatory Visit: Payer: Medicare Other | Admitting: Occupational Therapy

## 2012-12-29 ENCOUNTER — Encounter: Payer: Self-pay | Admitting: Cardiology

## 2012-12-29 ENCOUNTER — Other Ambulatory Visit: Payer: Self-pay | Admitting: Cardiovascular Disease

## 2012-12-29 ENCOUNTER — Ambulatory Visit (INDEPENDENT_AMBULATORY_CARE_PROVIDER_SITE_OTHER): Payer: Medicare Other | Admitting: Cardiology

## 2012-12-29 VITALS — BP 116/82 | HR 76 | Ht 74.5 in | Wt 241.4 lb

## 2012-12-29 DIAGNOSIS — R609 Edema, unspecified: Secondary | ICD-10-CM

## 2012-12-29 DIAGNOSIS — I2589 Other forms of chronic ischemic heart disease: Secondary | ICD-10-CM

## 2012-12-29 DIAGNOSIS — I6350 Cerebral infarction due to unspecified occlusion or stenosis of unspecified cerebral artery: Secondary | ICD-10-CM

## 2012-12-29 DIAGNOSIS — R600 Localized edema: Secondary | ICD-10-CM

## 2012-12-29 DIAGNOSIS — I4891 Unspecified atrial fibrillation: Secondary | ICD-10-CM

## 2012-12-29 DIAGNOSIS — I480 Paroxysmal atrial fibrillation: Secondary | ICD-10-CM

## 2012-12-29 DIAGNOSIS — I5042 Chronic combined systolic (congestive) and diastolic (congestive) heart failure: Secondary | ICD-10-CM

## 2012-12-29 DIAGNOSIS — I639 Cerebral infarction, unspecified: Secondary | ICD-10-CM

## 2012-12-29 DIAGNOSIS — I739 Peripheral vascular disease, unspecified: Secondary | ICD-10-CM

## 2012-12-29 DIAGNOSIS — I25810 Atherosclerosis of coronary artery bypass graft(s) without angina pectoris: Secondary | ICD-10-CM

## 2012-12-29 DIAGNOSIS — E7800 Pure hypercholesterolemia, unspecified: Secondary | ICD-10-CM

## 2012-12-29 DIAGNOSIS — I509 Heart failure, unspecified: Secondary | ICD-10-CM

## 2012-12-29 DIAGNOSIS — I255 Ischemic cardiomyopathy: Secondary | ICD-10-CM

## 2012-12-29 MED ORDER — FUROSEMIDE 20 MG PO TABS: 20.0000 mg | ORAL_TABLET | Freq: Two times a day (BID) | ORAL | Status: DC

## 2012-12-29 NOTE — Patient Instructions (Addendum)
Your physician recommends that you schedule a follow-up appointment in: 3 months with Dr.Croitoru for your next pacemaker interrogation.   Your physician has requested that you have a lower extremity arterial duplex. This test is an ultrasound of the arteries in the legs . It looks at arterial blood flow in the legs. Allow one hour for Lower Arterial scans. There are no restrictions or special instructions  Your physician has requested that you have a lower  extremity venous duplex. This test is an ultrasound of the veins in the legs . It looks at venous blood flow that carries blood from the heart to the legs . Allow one hour for a Lower Venous exam. There are no restrictions or special instructions. Take your Lasix (furosomide) as directed    20 mg twice aday  Your physician wants you to follow-up in 3 months Dr Herbie Baltimore.  You will receive a reminder letter in the mail two months in advance. If you don't receive a letter, please call our office to schedule the follow-up appointment.

## 2012-12-29 NOTE — Progress Notes (Signed)
In office pacemaker interrogation. Normal device function. No changes made this session. 

## 2012-12-30 LAB — PACEMAKER DEVICE OBSERVATION
AL THRESHOLD: 0.75 V
ATRIAL PACING PM: 75
BAMS-0001: 150 {beats}/min
BAMS-0003: 70 {beats}/min
RV LEAD AMPLITUDE: 12 mv

## 2012-12-31 ENCOUNTER — Ambulatory Visit: Payer: Medicare Other | Admitting: Occupational Therapy

## 2013-01-06 ENCOUNTER — Ambulatory Visit: Payer: Medicare Other | Attending: Physical Medicine and Rehabilitation | Admitting: Occupational Therapy

## 2013-01-06 DIAGNOSIS — R279 Unspecified lack of coordination: Secondary | ICD-10-CM | POA: Insufficient documentation

## 2013-01-06 DIAGNOSIS — M6281 Muscle weakness (generalized): Secondary | ICD-10-CM | POA: Insufficient documentation

## 2013-01-06 DIAGNOSIS — IMO0001 Reserved for inherently not codable concepts without codable children: Secondary | ICD-10-CM | POA: Insufficient documentation

## 2013-01-08 ENCOUNTER — Ambulatory Visit: Payer: Medicare Other | Admitting: Occupational Therapy

## 2013-01-10 ENCOUNTER — Encounter: Payer: Self-pay | Admitting: Cardiology

## 2013-01-10 DIAGNOSIS — I739 Peripheral vascular disease, unspecified: Secondary | ICD-10-CM | POA: Insufficient documentation

## 2013-01-10 NOTE — Assessment & Plan Note (Signed)
He continues to compress and will how well he is recovered. He is still frustrated because he is not to use his left hand. I encouraged him to continue working on his rehabilitation working on fine Chemical engineer. Hopefully he will continue to improve.

## 2013-01-10 NOTE — Assessment & Plan Note (Signed)
Interestingly, despite his having atrial fibrillation and significant stress from a stroke, is EF actually was improved on his most recent echocardiogram up to the 40-45% range. He doesn't have that much the way of active heart failure symptoms besides mild edema. I think the edema is probably more related to venous stasis than heart failure as he denies any PND or orthopnea. He is on standing dose of Lasix which would increase to 20 mg twice a day with a sliding scale component.  We set roughly 235 pounds as his dry weight the last time. He is a little bit from that, but it sounds like his been actually eating more so maybe we need to set her dry weight up to 238-240 pounds. His blood pressure started to normalize out. We may very well go to restart low dose of an ACE inhibitor at next visit.

## 2013-01-10 NOTE — Assessment & Plan Note (Signed)
Continued to be on Lipitor 20 mg daily. He is due for having labs checked which we will do at his next visit.

## 2013-01-10 NOTE — Assessment & Plan Note (Signed)
Relatively stable with no recurrent anginal symptoms. With him being on Eliquis, he is no longer on an antiplatelet agent. The father we get from his stroke we can probably restart aspirin. He is on a statin, beta blocker, and he is doing fairly well without any symptoms.  Most recent evaluations have shown patent grafts despite a nuclear stress test in January 2013 showing apical defect. Plan: Continue current.

## 2013-01-10 NOTE — Assessment & Plan Note (Signed)
Recheck lower extremity arterial Dopplers

## 2013-01-10 NOTE — Assessment & Plan Note (Signed)
No real active symptoms of heart failure. 6 adjust medications his back on the Lasix and sliding scale component. We do need to be careful because he has had a history of renal insufficiency and hypotension with dehydration.

## 2013-01-10 NOTE — Assessment & Plan Note (Signed)
As far as I can tell, he is essentially asymptomatic and is in A. fib as well as a rapid rate. He is anticoagulated with Eliquis and rate controlled with beta blocker.

## 2013-01-10 NOTE — Assessment & Plan Note (Signed)
He is on standing Lasix which are increasing to twice a day. He is also wearing compression stockings. I think he may very well have some venous insufficiency. We'll then check venous insufficiency Dopplers. Also because he is having leg aching and cramping will go ahead and check arterial Dopplers or add this to make sure there is no claudication.  He has a history of PAD diagnosed, I do not have any recent Doppler ultrasounds.

## 2013-01-10 NOTE — Progress Notes (Signed)
Patient ID: Richard Chavez, male   DOB: [birth year], 79 y.o.   MRN: [id number] PCP: Thayer Headings, MD  Clinic Note: Chief Complaint  Patient presents with  . 3 month visit    waekness legs,no chest pain,gas pain, no sob, no edema,bruising left arm   HPI: Richard Chavez is a 79 y.o. male with a PMH below who presents today for followup. He is a very healthy gentleman with a history of coronary artery disease status post CABG in 48, he's had pacemaker placement in the past for bradycardia, his most recent issue was a right-sided stroke in May of this year at which time he was definitively diagnosed with paroxysmal atrial fibrillation. He has had multiple other medical problems as noted his past medical history in Epic..  Interval History: He presents today he and well a no problems at all with dyspnea or chest pain with rest or exertion. He does have swelling mainly in his legs more to take her many has some varicose veins, right greater than left in his thighs and upper calf. He knows that this is been more notable as far as aching since his stroke. He does usually wear compression stockings and that keeps the swelling for the most part at day. He says is usually better as far as her hurting when he rests. He is working very hard on his rehabilitation. He is now much able to use his left hand. His lites fine motor skills with it. This is difficult because he is left-handed. I think he finally beyond the stage of neglect of this and pressure. He does have aching cramping in his legs and he walks but also to 80 at length at baseline. He does not have any chest pain or shortness breath at rest or exertion. He notes occasional intermittent palpitations but nothing significant. Nothing that would make him feel he can syncope or near syncope. No lightheadedness or dizziness just occasional orthostatic symptoms. He says he eats 2 good meals a day. He also has his son checking up on him off or frequently than he  had in the past.  The remainder of Cardiovascular ROS is as follows: no chest pain or dyspnea on exertion negative for - irregular heartbeat, loss of consciousness, orthopnea, paroxysmal nocturnal dyspnea, rapid heart rate or shortness of breath Additional cardiac review of systems: Lightheadedness - no, dizziness - no, syncope/near-syncope - no; TIA/amaurosis fugax - no Melena - no, hematochezia no; hematuria - no; nosebleeds - no; claudication - and leg aching and cramping with walking is better or as in the pelvis this is claudication.  Past Medical History  Diagnosis Date  . CAD (coronary artery disease), native coronary artery - s/p MI  1978      Status post CABG x 3 (LIMA-LAD, SVG-OM1-OM2, SVG-dRCA  Cath 06/2011 - all patent as of 05/27/11; 100% occluded RCA, LAD, and Circumflex.   . Ischemic Cardiomyopathy 07/2011; 09/2012    2D Echo - EF 35-40, mod dilated LA; Relook Echo 09/2012: EF 40-45%     .  AV block, third-degree  Post St. Jude pacemaker, EF 35-40, does have intermittent atrial tachycardia, either PAt or Afib  07/02/2006   . Syncope - unclear etiology, ? Vasovagal due to dehydration    07/04/2011  . CVA (cerebral infarction)  Pacemaker interrogation revealed atrial fibrillation prior to CVA. -- Right-sided MCA CVA with left arm weakness and mild paralysis   10/09/2012   . Paroxysmal atrial fibrillation - now rate controlled (formally  with [facility])    10/03/2012   . Peripheral vascular disease   . Hypertension   . Hyperlipidemia   . GERD (gastroesophageal reflux disease)   . COPD (chronic obstructive pulmonary disease)   . CRF (chronic renal failure)   . Barrett's esophagus   . OP (osteoporosis)   . Arthritis   . BPH (benign prostatic hyperplasia)   . Polyp of colon     removed  . Sleep apnea     STOP BANG SCORE 4  . BPH (benign prostatic hypertrophy) 11/15/2011    Prior Cardiac Evaluation: Past Surgical History  Procedure Laterality Date  . Pacemaker  placement - St. Jude   2007  . Coronary artery bypass graft  1978    X4 - LIMA-LAD, SVG-RCA,  SVG-OM1-OM 2   . Cardiac catheterization  06/06/2011    occluded proximal RCA, ostial LAD and mid Circumflex after OM 1.; [place] LIMA-LAD, patent SVG-OM1-OM 2, patent SVG-RCA. Reduced ejection fraction of 30-35%.  . Cardiac catheterization  06/14/2010    LIMA to LAD, SVG to RCA, SVG to OM1 Circumflex, 100% occluded RCA, 100% occluded circumfkex, 100% occluded LAD, no evidence of graft dysfunction, continue medical therapy  . Doppler echocardiography  10/02/2012    EF 40-45%; possible grade 1 diastolic dysfunction, but A. fib precludes this. Mild atrial dilatation bilaterally. Moderately elevated pulmonary pressures of 40 mmHg.    No Known Allergies  Current Outpatient Prescriptions  Medication Sig Dispense Refill  . albuterol-ipratropium (COMBIVENT) 18-103 MCG/ACT inhaler Inhale 2 puffs into the lungs 2 (two) times daily.       Marland Kitchen apixaban (ELIQUIS) 5 MG TABS tablet Take 1 tablet (5 mg total) by mouth 2 (two) times daily.  180 tablet  3  . atorvastatin (LIPITOR) 20 MG tablet Take 20 mg by mouth daily with breakfast.       . docusate sodium 100 MG CAPS Take 100 mg by mouth daily.  10 capsule  0  . fluticasone (FLONASE) 50 MCG/ACT nasal spray Place 2 sprays into the nose 2 (two) times daily as needed. For dryness.      . Fluticasone-Salmeterol (ADVAIR DISKUS) 500-50 MCG/DOSE AEPB Inhale 1 puff into the lungs every 12 (twelve) hours.       . furosemide (LASIX) 20 MG tablet Take 1 tablet (20 mg total) by mouth 2 (two) times daily.  180 tablet  3  . gabapentin (NEURONTIN) 300 MG capsule Take 600 mg by mouth 3 (three) times daily.       Marland Kitchen guaiFENesin (MUCINEX) 600 MG 12 hr tablet Take 1,200 mg by mouth 2 (two) times daily.      Marland Kitchen loratadine (CLARITIN) 10 MG tablet Take 10 mg by mouth daily with breakfast.       . Magnesium Chloride (SLOW-MAG) 535 (64 MG) MG TBCR Take 1 tablet by mouth 2 (two) times daily.        . metoprolol (LOPRESSOR) 50 MG tablet Take 50 mg by mouth 2 (two) times daily.      . Omega-3 Fatty Acids (FISH OIL) 1000 MG CAPS Take 1 capsule by mouth daily.       . predniSONE (DELTASONE) 5 MG tablet Take 5 mg by mouth daily.      . ranitidine (ZANTAC) 150 MG capsule Take 150 mg by mouth 2 (two) times daily.       Marland Kitchen tiotropium (SPIRIVA) 18 MCG inhalation capsule Place 18 mcg into inhaler and inhale daily with breakfast.       .  Vitamin D, Ergocalciferol, (DRISDOL) 50000 UNITS CAPS Take 50,000 Units by mouth every 7 (seven) days. Take on Fridays      . vitamin E 1000 UNIT capsule Take 1,000 Units by mouth daily with breakfast.        No current facility-administered medications for this visit.    History   Social History Narrative   Patient's son Richard Chavez lives with him at home.   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    ROS: A comprehensive Review of Systems - Negative except pertinent positives noted above and minor pauses beloow. Gastrointestinal ROS: no abdominal pain, change in bowel habits, or black or bloody stools positive for - constipation and gas/bloating Neurological ROS: no TIA or stroke symptoms Notably improved strength and function of the left arm still residual fine motor weakness in the left hand.  PHYSICAL EXAM BP 116/82  Pulse 76  Ht 6' 2.5" (1.892 m)  Wt 241 lb 6.4 oz (109.498 kg)  BMI 30.59 kg/m2 General appearance: A&O x 3, cooperative, appears stated age, no distress, mildly obese and Somewhat hard of hearing & has a hard time comprehending some some of what we are saying.  Neck: no carotid bruit, no JVD and supple, symmetrical, trachea midline  Lungs: clear to auscultation bilaterally, normal percussion bilaterally and With only mildly  decreased bibasal breath sounds. Mild interstitial sounds but no W./R./R.  Heart: normal apical impulse, regular rate and rhythm, S1, S2 normal and No murmurs, rubs or gallops  Abdomen: soft, non-tender; bowel sounds normal; no masses, no organomegaly and Obese  Extremities: edema Trace bilateral, no ulcers, gangrene or trophic changes and venous stasis dermatitis noted  Pulses: 2+ and symmetric  Neurologic: Mental status: Alert, oriented, thought content appropriate, affect: normal ; left hand grip is up to 3-4/5, and his arm strength is 4-5/5. He does have decreased fine touch and fine motor movement of the hand. Cranial nerves: normal  HEENT: [place]/AT, EOMI, MMM, anicteric sclera; he has puffy bags under his eyes.  RUE:[id number] today: Yes Rate: 76 , Rhythm: Atrial paced rhythm with prolonged AV conduction and occasional PVCs  Recent Labs: None recently  ASSESSMENT / PLAN: CAD CABG 16-Sep-1996, patent grafts 06/06/11 after abn Myoview Relatively stable with no recurrent anginal symptoms. With him being on Eliquis, he is no longer on an antiplatelet agent. The father we get from his stroke we can probably restart aspirin. He is on a statin, beta blocker, and he is doing fairly well without any symptoms.  Most recent evaluations have shown patent grafts despite a nuclear stress test in January 2013 showing apical defect. Plan: Continue current.  Cardiomyopathy, ischemic, EF 30-35% by cath; 40-45 by echocardiogram in 05-13-14Interestingly, despite his having atrial fibrillation and significant stress from a stroke, is EF actually was improved on his most recent echocardiogram up to the 40-45% range. He doesn't have that much the way of active heart failure symptoms besides mild edema. I think the edema is probably more related to venous stasis than heart failure as he denies any PND or orthopnea. He is on standing dose of Lasix which would increase to 20 mg twice a day with a sliding scale  component.  We set roughly 235  pounds as his dry weight the last time. He is a little bit from that, but it sounds like his been actually eating more so maybe we need to set her dry weight up to 238-240 pounds. His blood pressure started to normalize out. We may very well go to restart low dose of an ACE inhibitor at next visit.  Chronic combined systolic and diastolic congestive heart failure No real active symptoms of heart failure. 6 adjust medications his back on the Lasix and sliding scale component. We do need to be careful because he has had a history of renal insufficiency and hypotension with dehydration.  CVA (cerebral infarction) He continues to compress and will how well he is recovered. He is still frustrated because he is not to use his left hand. I encouraged him to continue working on his rehabilitation working on fine Chemical engineer. Hopefully he will continue to improve.  Paroxysmal atrial fibrillation - now rate controlled (formally with [facility]) As far as I can tell, he is essentially asymptomatic and is in A. fib as well as a rapid rate. He is anticoagulated with Eliquis and rate controlled with beta blocker.  Hypercholesteremia Continued to be on Lipitor 20 mg daily. He is due for having labs checked which we will do at his next visit.  Edema of both legs He is on standing Lasix which are increasing to twice a day. He is also wearing compression stockings. I think he may very well have some venous insufficiency. We'll then check venous insufficiency Dopplers. Also because he is having leg aching and cramping will go ahead and check arterial Dopplers or add this to make sure there is no claudication.  He has a history of PAD diagnosed, I do not have any recent Doppler ultrasounds.  Peripheral vascular disease Recheck lower extremity arterial Dopplers     Orders Placed This Encounter  Procedures  . EKG 12-Lead  . Lower Extremity Arterial Duplex Bilateral     claudication    Standing Status: Future     Number of Occurrences:      Standing Expiration Date: 12/29/2013    Order Specific Question:  Laterality    Answer:  Bilateral    Order Specific Question:  Where should this test be performed:    Answer:  [facility]  . Lower Extremity Venous Duplex Bilateral    Dx lower extremities edema  Venous reflux vs claudication    Standing Status: Future     Number of Occurrences:      Standing Expiration Date: 12/29/2013    Order Specific Question:  Laterality    Answer:  Bilateral    Order Specific Question:  Where should this test be performed:    Answer:  [facility]   Meds ordered this encounter  Medications  . furosemide (LASIX) 20 MG tablet    Sig: Take 1 tablet (20 mg total) by mouth 2 (two) times daily.    Dispense:  180 tablet    Refill:  3   Followup: 3 months  Adysen Raphael W. Herbie Baltimore, M.D., M.S. [facility] [address] [place], [place]  [address]  [phone] Pager # [phone]

## 2013-01-12 ENCOUNTER — Ambulatory Visit: Payer: Medicare Other | Admitting: Occupational Therapy

## 2013-01-14 ENCOUNTER — Encounter: Payer: Medicare Other | Admitting: Occupational Therapy

## 2013-01-21 ENCOUNTER — Ambulatory Visit (HOSPITAL_COMMUNITY)
Admission: RE | Admit: 2013-01-21 | Discharge: 2013-01-21 | Disposition: A | Discharge status: Home / Self Care | Payer: Medicare Other | Source: Ambulatory Visit | Attending: Cardiovascular Disease | Admitting: Cardiovascular Disease

## 2013-01-21 ENCOUNTER — Ambulatory Visit (HOSPITAL_BASED_OUTPATIENT_CLINIC_OR_DEPARTMENT_OTHER)
Admission: RE | Admit: 2013-01-21 | Discharge: 2013-01-21 | Disposition: A | Discharge status: Home / Self Care | Payer: Medicare Other | Source: Ambulatory Visit | Attending: Cardiovascular Disease | Admitting: Cardiovascular Disease

## 2013-01-21 DIAGNOSIS — M7989 Other specified soft tissue disorders: Secondary | ICD-10-CM

## 2013-01-21 DIAGNOSIS — R600 Localized edema: Secondary | ICD-10-CM

## 2013-01-21 DIAGNOSIS — I70219 Atherosclerosis of native arteries of extremities with intermittent claudication, unspecified extremity: Secondary | ICD-10-CM

## 2013-01-21 DIAGNOSIS — R609 Edema, unspecified: Secondary | ICD-10-CM | POA: Insufficient documentation

## 2013-01-21 DIAGNOSIS — I739 Peripheral vascular disease, unspecified: Secondary | ICD-10-CM | POA: Insufficient documentation

## 2013-01-21 NOTE — Progress Notes (Signed)
Venous Duplex Lower Ext. Completed. Christie Viscomi, BS, RDMS, RVT  

## 2013-01-21 NOTE — Progress Notes (Signed)
Arterial Duplex Lower Ext. Completed. Khole Arterburn, BS, RDMS, RVT  

## 2013-02-02 ENCOUNTER — Encounter
Payer: Medicare Other | Attending: Physical Medicine and Rehabilitation | Admitting: Physical Medicine and Rehabilitation

## 2013-02-02 ENCOUNTER — Encounter: Payer: Self-pay | Admitting: Physical Medicine and Rehabilitation

## 2013-02-02 VITALS — BP 134/69 | HR 65 | Resp 14 | Ht 74.0 in | Wt 244.8 lb

## 2013-02-02 DIAGNOSIS — G4730 Sleep apnea, unspecified: Secondary | ICD-10-CM | POA: Insufficient documentation

## 2013-02-02 DIAGNOSIS — I739 Peripheral vascular disease, unspecified: Secondary | ICD-10-CM | POA: Insufficient documentation

## 2013-02-02 DIAGNOSIS — I10 Essential (primary) hypertension: Secondary | ICD-10-CM | POA: Insufficient documentation

## 2013-02-02 DIAGNOSIS — M810 Age-related osteoporosis without current pathological fracture: Secondary | ICD-10-CM | POA: Insufficient documentation

## 2013-02-02 DIAGNOSIS — I252 Old myocardial infarction: Secondary | ICD-10-CM | POA: Insufficient documentation

## 2013-02-02 DIAGNOSIS — I2589 Other forms of chronic ischemic heart disease: Secondary | ICD-10-CM | POA: Insufficient documentation

## 2013-02-02 DIAGNOSIS — I2510 Atherosclerotic heart disease of native coronary artery without angina pectoris: Secondary | ICD-10-CM | POA: Insufficient documentation

## 2013-02-02 DIAGNOSIS — I69959 Hemiplegia and hemiparesis following unspecified cerebrovascular disease affecting unspecified side: Secondary | ICD-10-CM | POA: Insufficient documentation

## 2013-02-02 DIAGNOSIS — E785 Hyperlipidemia, unspecified: Secondary | ICD-10-CM | POA: Insufficient documentation

## 2013-02-02 DIAGNOSIS — I4891 Unspecified atrial fibrillation: Secondary | ICD-10-CM | POA: Insufficient documentation

## 2013-02-02 DIAGNOSIS — I6350 Cerebral infarction due to unspecified occlusion or stenosis of unspecified cerebral artery: Secondary | ICD-10-CM

## 2013-02-02 DIAGNOSIS — J4489 Other specified chronic obstructive pulmonary disease: Secondary | ICD-10-CM | POA: Insufficient documentation

## 2013-02-02 DIAGNOSIS — I639 Cerebral infarction, unspecified: Secondary | ICD-10-CM

## 2013-02-02 DIAGNOSIS — Z951 Presence of aortocoronary bypass graft: Secondary | ICD-10-CM | POA: Insufficient documentation

## 2013-02-02 DIAGNOSIS — K219 Gastro-esophageal reflux disease without esophagitis: Secondary | ICD-10-CM | POA: Insufficient documentation

## 2013-02-02 DIAGNOSIS — J449 Chronic obstructive pulmonary disease, unspecified: Secondary | ICD-10-CM | POA: Insufficient documentation

## 2013-02-02 DIAGNOSIS — Z950 Presence of cardiac pacemaker: Secondary | ICD-10-CM | POA: Insufficient documentation

## 2013-02-02 NOTE — Patient Instructions (Addendum)
You can try Arnica cream for your joint pain. Please ask your PCP, whether you could use Voltaren gel for your joint pain, if the Arnica cream is not sufficient

## 2013-02-02 NOTE — Progress Notes (Signed)
Subjective:    Patient ID: Richard Chavez, male    DOB: 1926-10-27, 79 y.o.   MRN: [id number]  HPI Richard Chavez is a 79 y.o. left handed male with history of a right subcortical stroke with left HP, on 10/01/12.  He is doing considerably well. He states that his walking is pretty stable, he did not have a recent fall. He only complains about some weakness and mild difficulty controlling of his LUE. He lives with his son, but is independent with the activities of daily life.  He has graduated from OT .  The patient reports that he has an appointment with neurology tomorrow Dr. Pearlean Brownie in September.  Pain Inventory Average Pain 6 Pain Right Now 6 My pain is tingling and aching  In the last 24 hours, has pain interfered with the following? General activity 6 Relation with others 3 Enjoyment of life 3 What TIME of day is your pain at its worst? all day Sleep (in general) Good  Pain is worse with: walking and standing Pain improves with: rest and medication Relief from Meds: 7  Mobility walk with assistance use a cane how many minutes can you walk? 10-15 ability to climb steps?  yes do you drive?  no Do you have any goals in this area?  yes  Function retired I need assistance with the following:  meal prep  Neuro/Psych weakness tingling trouble walking spasms  Prior Studies Any changes since last visit?  yes ultrasound(legs)  Physicians involved in your care Any changes since last visit?  no   Family History  Problem Relation Age of Onset  . Leukemia Father   . Heart disease Father   . Alzheimer's disease Mother   . Arthritis Mother   . Lung cancer Son 66  . Alzheimer's disease Maternal Grandmother    History   Social History  . Marital Status: Widowed    Spouse Name: N/A    Number of Children: N/A  . Years of Education: N/A   Occupational History  . RETIRED [employer]   Social History Main Topics  . Smoking status: Former Smoker -- 0.30  packs/day for 15 years    Types: Cigarettes    Quit date: 05/07/1985  . Smokeless tobacco: Never Used  . Alcohol Use: No  . Drug Use: No  . Sexual Activity: No   Other Topics Concern  . Not on file   Social History Narrative   Patient's son Jettson Crable lives with him at home.   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   Past Surgical History  Procedure Laterality Date  . Pacemaker placement  10/07/2005  . Cholecystectomy  2003/10/08  . Coronary artery bypass graft  1978    X3  . Lesion excision      from penis  . Cardiac catheterization  06/06/2011    occluded proximal RCA, ostial LAD and mid Circumflex after OM 1.; [place] LIMA-LAD, patent SVG-OM1-OM 2, patent SVG-RCA. Reduced ejection fraction of 30-35%.  . Cardiac catheterization  06/14/2010    LIMA to LAD, SVG to RCA, SVG to OM1 Circumflex, 100% occluded RCA, 100% occluded circumfkex, 100% occluded LAD, no evidence of graft dysfunction, continue medical therapy  . Cardiac catheterization  04/12/2003    3 vessel coronary artery disease, continue medical treatment  . Cardiac catheterization  10/07/2000    Severe 3 vessel coronary artery disease, continue medical treatment  . Doppler echocardiography  10/02/2012    EF 40-45%; possible grade 1 diastolic dysfunction, but A. fib precludes this. Mild atrial dilatation bilaterally. Moderately elevated pulmonary pressures of 40 mmHg.   Past Medical History  Diagnosis Date  . CAD (coronary artery disease), native coronary artery     Status post CABG  . Hypertension   . Hyperlipidemia   . GERD (gastroesophageal reflux disease)   . COPD (chronic obstructive pulmonary disease)   . CRF (chronic renal failure)   . Barrett's esophagus   . OP (osteoporosis)   . CAD (coronary artery disease)  of artery bypass graft 07/04/2011    LIMA-LAD; SVG-OM2-OM3, SVG-RCA -- all patent as of 05/27/11  . Syncope 07/04/2011  . Pacemaker 07/04/2011  . Cardiomyopathy, ischemic 07/2011; 09/2012    2D Echo - EF 35-40, mod dilated LA; Relook Echo 09/2012: EF 40-45%  . Myocardial infarction     25 yrs ago  . Peripheral vascular disease   . Arthritis   . BPH (benign prostatic hyperplasia)   . Polyp of colon     removed  . Sleep apnea     STOP BANG SCORE 4  . AV block, 3rd degree     Post St. Jude pacemaker, EF 35-40, does have intermittent atrial tachycardia, either PAt or Afib  . BPH (benign prostatic hypertrophy) 11/15/2011  . SOB (shortness of breath) on exertion 02/2010    2D Echo - EF 35-45,   . Paroxysmal atrial fibrillation - now rate controlled ([facility]ally with [facility]) 10/03/2012  . CVA (cerebral infarction) 10/09/2012    Noted to be in atrial fibrillation with evaluation of pacemaker, prior to CVA. Right-sided MCA stroke with left arm weakness/mild paralysis -- notably improved    There were no vitals taken for this visit.    Review of Systems  Constitutional: Positive for unexpected weight change.  Gastrointestinal: Positive for constipation.  Musculoskeletal: Positive for gait problem.  Neurological: Positive for weakness.       Spasms, tingling  All other systems reviewed and are negative.       Objective:   Physical Exam Constitutional: He is oriented to person, place, and time. He appears well-developed and well-nourished.  HENT:  Head: Normocephalic.  Neck: Neck supple.  Neurological: He is alert and oriented to person, place, and time.  Skin: Skin is warm and dry.  Psychiatric: He has a normal mood and affect.  Symmetric normal motor tone is noted throughout. Normal muscle bulk. Muscle testing reveals 5/5 muscle strength of the upper extremityon the right, 4/5 of the left UE, 4 /5 of his left hand muscles, and 5/5 of the lower extremity on the right, 4+/5 on the  left LE. Full range of motion in upper and lower extremities. Fine motor movements are a little slower on the left.  DTR in the upper and lower extremity are present and symmetric 2+. No clonus is noted.  Patient arises from chair with mild difficulty. Wide based gait with normal arm swing bilateral .No pronator drift.         Assessment & Plan:  1. Right subcortical stroke, on 10/01/12, with left HP UE more effected than LLE.Improving very well.  PLAN  Ordered outpatient OT, to improve function of LUE, and fine motor skills at his last visit, he graduated  OT, and is doing well with his left hand and left UE function.  Patient should continue with his walking and exercise program at home as tolerated and as it is safe.  He will follow up with neurology tomorrow. Follow up with [place] in 6 month.

## 2013-02-03 ENCOUNTER — Ambulatory Visit (INDEPENDENT_AMBULATORY_CARE_PROVIDER_SITE_OTHER): Payer: Medicare Other | Admitting: Nurse Practitioner

## 2013-02-03 ENCOUNTER — Encounter: Payer: Self-pay | Admitting: Nurse Practitioner

## 2013-02-03 VITALS — BP 123/69 | HR 68 | Temp 97.7°F | Ht 72.5 in | Wt 246.0 lb

## 2013-02-03 DIAGNOSIS — R531 Weakness: Secondary | ICD-10-CM

## 2013-02-03 DIAGNOSIS — I6350 Cerebral infarction due to unspecified occlusion or stenosis of unspecified cerebral artery: Secondary | ICD-10-CM

## 2013-02-03 DIAGNOSIS — I4891 Unspecified atrial fibrillation: Secondary | ICD-10-CM

## 2013-02-03 DIAGNOSIS — M6281 Muscle weakness (generalized): Secondary | ICD-10-CM

## 2013-02-03 NOTE — Progress Notes (Signed)
[facility]  PATIENT: Richard Chavez DOB: 1926/09/18   HISTORY FROM: patient, chart REASON FOR VISIT: routine follow up  HISTORY OF PRESENT ILLNESS:  Richard Chavez is an 79 y.o. male white male with PMH of CAD s/p CABG, CHF EF 30-35%, s/p PPM, HTN, and COPD presenting to the ED today 10/01/2012 alongside his son with complaints of worsening fatigue, generalized weakness, left arm weakness, SOB, fever, and chills. History is mainly obtained by son, Richard Chavez, who explains that his father started complaining of left hand numbness yesterday morning (09/30/2012) ~11am. They went to the PCP office and returned home later that day thought to have numbness secondary to neuropathy. He was still moving his arm and able to drive and walk at that time, however, upon waking up this 10/02/28, he was confused, not moving his left arm at all, and had difficulty walking. Richard Chavez does not have any similar prior episodes or any history of CVA. His son does claim he takes an ASA daily, however, it is not on his medication list on epic and did not get any ASA this morning. Of note, he was recently discharged from the hospital on 09/17/12 for CAP on levofloxacin course.   UPDATE 02/03/13 (LL):  Richard Chavez comes to office for stroke follow up.  He reports that he is doing well, taking all of his medications as prescribed.  He does not use an assistive device for walking, he reports no falls, and appears steady.  He was changed to Eliquis after being found to have atrial fibrillation in the hospital.  Patient denies medication side effects, with no signs of bleeding or excessive bruising.  His metoprolol dose was recently doubled, BP today is 123/69, HR 68.  His only complaint is constipation, which he states he takes milk of magnesia for.  He is accompanied by his son.  He has mild weakness on his left arm and hand.  Cholesterol well-controlled.  REVIEW OF SYSTEMS: Full 14 system review of systems performed and  notable only for: constitutional: weight gain, fatigue cardiovascular: swelling in legs, murmur respiratory: N/A endocrine: N/A  ear/nose/throat: N/A  Hematology/Lymph: N/A musculoskeletal: joint pain, aching muscles skin: moles genitourinary: Impotence Gastrointestinal: constipation allergy/immunology: allergies neurological: numbness, weakness sleep: restless legs psychiatric: N/A  ALLERGIES: No Known Allergies  HOME MEDICATIONS: Outpatient Prescriptions Prior to Visit  Medication Sig Dispense Refill  . albuterol-ipratropium (COMBIVENT) 18-103 MCG/ACT inhaler Inhale 2 puffs into the lungs 2 (two) times daily.       Marland Kitchen apixaban (ELIQUIS) 5 MG TABS tablet Take 1 tablet (5 mg total) by mouth 2 (two) times daily.  180 tablet  3  . atorvastatin (LIPITOR) 20 MG tablet Take 20 mg by mouth daily with breakfast.       . docusate sodium 100 MG CAPS Take 100 mg by mouth daily.  10 capsule  0  . fluticasone (FLONASE) 50 MCG/ACT nasal spray Place 2 sprays into the nose 2 (two) times daily as needed. For dryness.      . Fluticasone-Salmeterol (ADVAIR DISKUS) 500-50 MCG/DOSE AEPB Inhale 1 puff into the lungs every 12 (twelve) hours.       . furosemide (LASIX) 20 MG tablet Take 1 tablet (20 mg total) by mouth 2 (two) times daily.  180 tablet  3  . gabapentin (NEURONTIN) 300 MG capsule Take 600 mg by mouth 3 (three) times daily.       Marland Kitchen guaiFENesin (MUCINEX) 600 MG 12 hr tablet Take 1,200 mg  by mouth 2 (two) times daily.      Marland Kitchen loratadine (CLARITIN) 10 MG tablet Take 10 mg by mouth daily with breakfast.       . Magnesium Chloride (SLOW-MAG) 535 (64 MG) MG TBCR Take 1 tablet by mouth 2 (two) times daily.       . metoprolol (LOPRESSOR) 50 MG tablet Take 50 mg by mouth 2 (two) times daily.      . Omega-3 Fatty Acids (FISH OIL) 1000 MG CAPS Take 1 capsule by mouth daily.       . potassium chloride (K-DUR,KLOR-CON) 10 MEQ tablet Take 10 mEq by mouth 2 (two) times daily.      . predniSONE (DELTASONE) 5  MG tablet Take 5 mg by mouth daily.      . ranitidine (ZANTAC) 150 MG capsule Take 150 mg by mouth 2 (two) times daily.       Marland Kitchen tiotropium (SPIRIVA) 18 MCG inhalation capsule Place 18 mcg into inhaler and inhale daily with breakfast.       . Vitamin D, Ergocalciferol, (DRISDOL) 50000 UNITS CAPS Take 50,000 Units by mouth every 7 (seven) days. Take on Fridays      . vitamin E 1000 UNIT capsule Take 1,000 Units by mouth daily with breakfast.        No facility-administered medications prior to visit.    PAST MEDICAL HISTORY: Past Medical History  Diagnosis Date  . CAD (coronary artery disease), native coronary artery     Status post CABG  . Hypertension   . Hyperlipidemia   . GERD (gastroesophageal reflux disease)   . COPD (chronic obstructive pulmonary disease)   . CRF (chronic renal failure)   . Barrett's esophagus   . OP (osteoporosis)   . CAD (coronary artery disease) of artery bypass graft 07/04/2011    LIMA-LAD; SVG-OM2-OM3, SVG-RCA -- all patent as of 05/27/11  . Syncope 07/04/2011  . Pacemaker 07/04/2011  . Cardiomyopathy, ischemic 07/2011; 09/2012    2D Echo - EF 35-40, mod dilated LA; Relook Echo 09/2012: EF 40-45%  . Myocardial infarction     25 yrs ago  . Peripheral vascular disease   . Arthritis   . BPH (benign prostatic hyperplasia)   . Polyp of colon     removed  . Sleep apnea     STOP BANG SCORE 4  . AV block, 3rd degree     Post St. Jude pacemaker, EF 35-40, does have intermittent atrial tachycardia, either PAt or Afib  . BPH (benign prostatic hypertrophy) 11/15/2011  . SOB (shortness of breath) on exertion 02/2010    2D Echo - EF 35-45,   . Paroxysmal atrial fibrillation - now rate controlled (formally with [facility] and Hospital) 10/03/2012  . CVA (cerebral infarction) 10/09/2012    Noted to be in atrial fibrillation with evaluation of pacemaker, prior to CVA. Right-sided MCA stroke with left arm weakness/mild paralysis -- notably improved     PAST SURGICAL  HISTORY: Past Surgical History  Procedure Laterality Date  . Pacemaker placement  2007  . Cholecystectomy  2005  . Coronary artery bypass graft  1978    X3  . Lesion excision      from penis  . Cardiac catheterization  06/06/2011    occluded proximal RCA, ostial LAD and mid Circumflex after OM 1.; [place] LIMA-LAD, patent SVG-OM1-OM 2, patent SVG-RCA. Reduced ejection fraction of 30-35%.  . Cardiac catheterization  06/14/2010    LIMA to LAD, SVG to RCA, SVG to OM1 Circumflex,  100% occluded RCA, 100% occluded circumfkex, 100% occluded LAD, no evidence of graft dysfunction, continue medical therapy  . Cardiac catheterization  04/12/2003    3 vessel coronary artery disease, continue medical treatment  . Cardiac catheterization  10/07/2000    Severe 3 vessel coronary artery disease, continue medical treatment  . Doppler echocardiography  10/02/2012    EF 40-45%; possible grade 1 diastolic dysfunction, but A. fib precludes this. Mild atrial dilatation bilaterally. Moderately elevated pulmonary pressures of 40 mmHg.    FAMILY HISTORY: Family History  Problem Relation Age of Onset  . Leukemia Father   . Heart disease Father   . Alzheimer's disease Mother   . Arthritis Mother   . Lung cancer Son 7  . Alzheimer's disease Maternal Grandmother     SOCIAL HISTORY: History   Social History  . Marital Status: Widowed    Spouse Name: N/A    Number of Children: 1  . Years of Education: 8   Occupational History  . RETIRED [employer]   Social History Main Topics  . Smoking status: Former Smoker -- 0.30 packs/day for 15 years    Types: Cigarettes    Quit date: 05/07/1985  . Smokeless tobacco: Never Used  . Alcohol Use: No  . Drug Use: No  . Sexual Activity: No   Other Topics Concern  . Not on file   Social History Narrative   Patient's son Richard Chavez lives with him at home.   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     PHYSICAL EXAM  Filed Vitals:   02/03/13 1514  BP: 123/69  Pulse: 68  Temp: 97.7 F (36.5 C)  TempSrc: Oral  Height: 6' 0.5" (1.842 m)  Weight: 246 lb (111.585 kg)   Body mass index is 32.89 kg/(m^2).  Generalized: In no acute distress, obese elderly Caucasian male Neck: Supple, soft carotid bruits  Cardiac: Regular rate rhythm, murmur  Pulmonary: Clear to auscultation bilaterally  Musculoskeletal: No deformity   Neurological examination  Mentation: Alert oriented to time, place, history taking, language fluent, and casual conversation Cranial nerve II-XII: Pupils were equal round reactive to light extraocular movements were full, visual field were full on confrontational test. facial sensation and strength were normal. hearing was intact to finger rubbing bilaterally. Uvula tongue midline. head turning and shoulder shrug and were normal and symmetric.Tongue protrusion into cheek strength was normal. MOTOR:Mild left lower face asymmetry. Tongue midline. Mild LUE drift.LUE 4/5 weakness Mild diminished fine finger movements on left. Orbits right over left upper extremity. Mild left grip weak.Marland KitchenLLE 4/5 weakness. SENSORY: Diminished left hemibody sensation. COORDINATION: finger-nose-finger, heel-to-shin bilaterally, there was no truncal ataxia REFLEXES: Symmetric 1+ GAIT/STATION: Rising up from seated position without assistance, normal stance, without trunk ataxia, moderate stride, good arm swing, smooth turning, unable to perform tiptoe, and heel walking without difficulty.  Romberg negative.   DIAGNOSTIC DATA (LABS, IMAGING, TESTING) - I reviewed patient records, labs, notes, testing and imaging myself where available.  Lab Results  Component Value Date   WBC 5.9 10/09/2012   HGB 12.4* 10/09/2012   HCT 37.4* 10/09/2012   MCV  85.6 10/09/2012   PLT 160 10/09/2012      Component Value Date/Time   NA  138 10/13/2012 0529   K 4.1 10/13/2012 0529   CL 105 10/13/2012 0529   CO2 25 10/13/2012 0529   GLUCOSE 85 10/13/2012 0529   BUN 21 10/13/2012 0529   CREATININE 1.21 10/13/2012 0529   CALCIUM 8.5 10/13/2012 0529   PROT 5.5* 10/09/2012 0510   ALBUMIN 2.3* 10/09/2012 0510   AST 16 10/09/2012 0510   ALT 15 10/09/2012 0510   ALKPHOS 44 10/09/2012 0510   BILITOT 0.5 10/09/2012 0510   GFRNONAA 54* 10/13/2012 0529   GFRAA 62* 10/13/2012 0529   Lab Results  Component Value Date   CHOL 92 10/02/2012   HDL 45 10/02/2012   LDLCALC 26 10/02/2012   TRIG 106 10/02/2012   CHOLHDL 2.0 10/02/2012   Lab Results  Component Value Date   HGBA1C 5.6 10/02/2012   CT of the brain 10/01/2012 1. No acute intracranial abnormality 2. Stable mild atrophy and chronic white matter ischemic changes 3. Intracranial atherosclerosis  10/02/2012 Cytotoxic edema and hypodensity in the vicinity of the inferior frontal gyrus and precentral gyrus on the right, compatible with subacute stroke. No hemorrhagic transformation observed  2D Echocardiogram 40-45%. No PFO or defect identified.  Carotid Doppler Preliminary findings: Right = 40-59% ICA stenosis. Left = Borderline 60-79% ICA stenosis.  TCD This was a normal transcranial Doppler study, with normal flow direction and velocity of all identified vessels of the anterior and posterior circulations, with no evidence of stenosis, vasospasm or occlusion. Low mean flow velocities in posterior circulation vessels are due to poor waveforms from suboptimal windows.  CXR 10/01/2012 Worsened aeration, especially on the right. Suspicious for alveolar pulmonary edema versus multifocal infection. Degraded exam, especially the lateral view.   ASSESSMENT AND PLAN Richard Chavez is a 79 y.o. Male with left arm weakness.  Initial CT imaging negative; unable to have MRI due to pacer. Suspect right brain infarct based on symptoms. Infarct felt to be  thrombotic given location secondary to small vessel disease, but patient found to have afib as well.  Patient with resultant left facial weakness, mild left hemiparesis.   Continue Eliquis  for secondary stroke prevention and maintain strict control of hypertension with blood pressure goal below 130/90, diabetes with hemoglobin A1c goal below 6.5% and lipids with LDL cholesterol goal below 100 mg/dL.  We will order repeat Carotid dopplers at next visit. Followup in 6 months.  Iverna Hammac NP-C 02/03/2013, 3:22 PM [facility] [address], [address] [place], [place] [address] [phone]

## 2013-02-03 NOTE — Patient Instructions (Addendum)
Continue Eliquis  for secondary stroke prevention and maintain strict control of hypertension with blood pressure goal below 130/90, diabetes with hemoglobin A1c goal below 6.5% and lipids with LDL cholesterol goal below 100 mg/dL.   Followup in the future with me in 3-6 months.  STROKE/TIA INSTRUCTIONS SMOKING Cigarette smoking nearly doubles your risk of having a stroke & is the single most alterable risk factor  If you smoke or have smoked in the last 12 months, you are advised to quit smoking for your health.  Most of the excess cardiovascular risk related to smoking disappears within a year of stopping.  Ask you doctor about anti-smoking medications  [place] Quit Line: 1-800-QUIT NOW  Free Smoking Cessation Classes [phone]  CHOLESTEROL Know your levels; limit fat & cholesterol in your diet  Lab Results  Component Value Date   CHOL 92 10/02/2012   HDL 45 10/02/2012   LDLCALC 26 10/02/2012   TRIG 106 10/02/2012   CHOLHDL 2.0 10/02/2012      Many patients benefit from treatment even if their cholesterol is at goal.  Goal: Total Cholesterol less than 160  Goal:  LDL less than 100  Goal:  HDL greater than 40  Goal:  Triglycerides less than 150  BLOOD PRESSURE American Stroke Association blood pressure target is less that 120/80 mm/Hg  Your discharge blood pressure is:  BP: 123/69 mmHg  Monitor your blood pressure  Limit your salt and alcohol intake  Many individuals will require more than one medication for high blood pressure  DIABETES (A1c is a blood sugar average for last 3 months) Goal A1c is under 7% (A1c is blood sugar average for last 3 months)  Diabetes: Diagnosis of diabetes:  A1c was not drawn this admission    Lab Results  Component Value Date   HGBA1C 5.6 10/02/2012    Your A1c can be lowered with medications, healthy diet, and exercise.  Check your blood sugar as directed by your physician  Call your physician if you experience unexplained or low blood  sugars.  PHYSICAL ACTIVITY/REHABILITATION Goal is 30 minutes at least 4 days per week    Activity decreases your risk of heart attack and stroke and makes your heart stronger.  It helps control your weight and blood pressure; helps you relax and can improve your mood.  Participate in a regular exercise program.  Talk with your doctor about the best form of exercise for you (dancing, walking, swimming, cycling).  DIET/WEIGHT Goal is to maintain a healthy weight  Your height is:  Height: 6' 0.5" (184.2 cm) Your current weight is: Weight: 246 lb (111.585 kg) Your body Mass Index (BMI) is:  BMI (Calculated): 33  Following the type of diet specifically designed for you will help prevent another stroke.  Your goal Body Mass Index (BMI) is 19-24.  Healthy food habits can help reduce 3 risk factors for stroke:  High cholesterol, hypertension, and excess weight.

## 2013-02-05 ENCOUNTER — Telehealth: Payer: Self-pay | Admitting: *Deleted

## 2013-02-05 NOTE — Telephone Encounter (Signed)
Spoke to patient. Result given . Verbalized understanding  

## 2013-02-05 NOTE — Telephone Encounter (Signed)
Message copied by Tobin Chad on Thu Feb 05, 2013  3:12 PM ------      Message from: [facility], [name]      Created: Tue Feb 03, 2013  3:13 PM       Mild abnormalities - continue to wear support stockings.            Marykay Lex, MD       ------

## 2013-02-18 ENCOUNTER — Encounter: Payer: Self-pay | Admitting: Cardiology

## 2013-03-17 IMAGING — CR DG CHEST 2V
2 series · 2 of 2 positions shown · non-contrast
Comparison: 07/02/2010

CLINICAL DATA: COPD

CHEST - 2 VIEW

[view not recorded (1 of 2)]
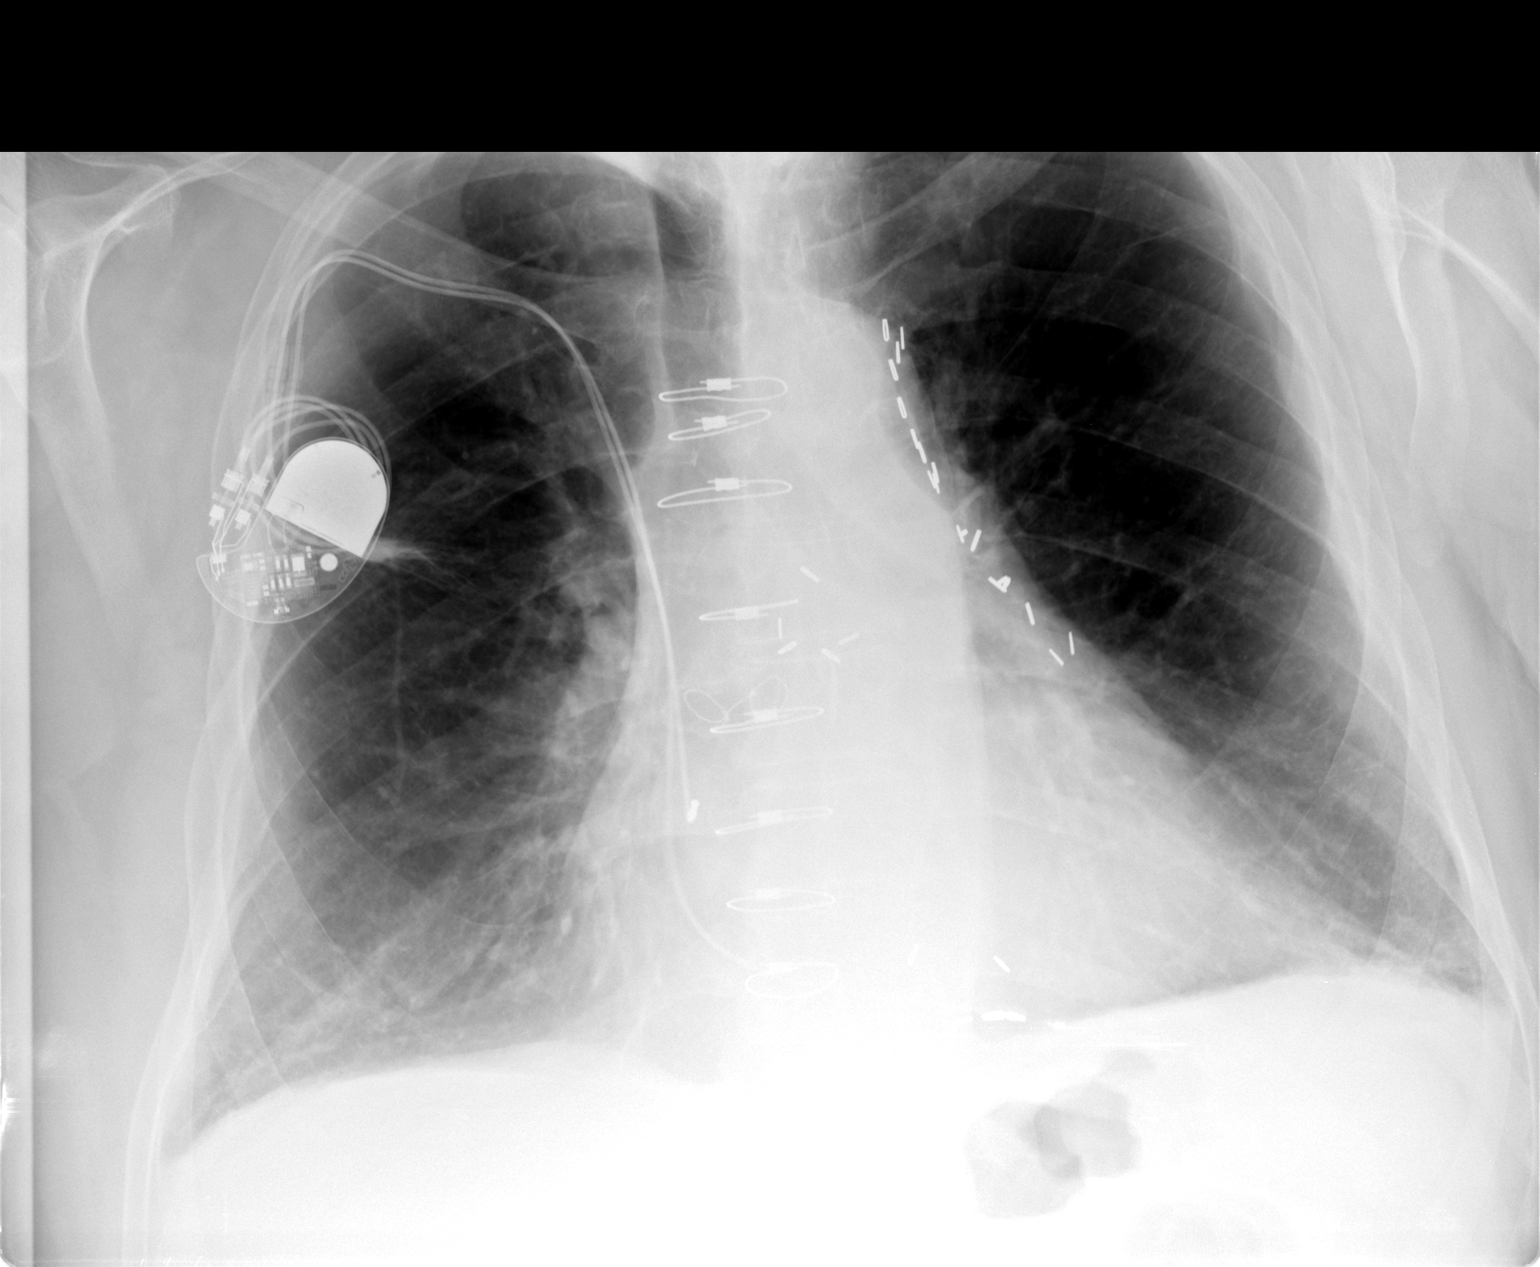

[view not recorded (2 of 2)]
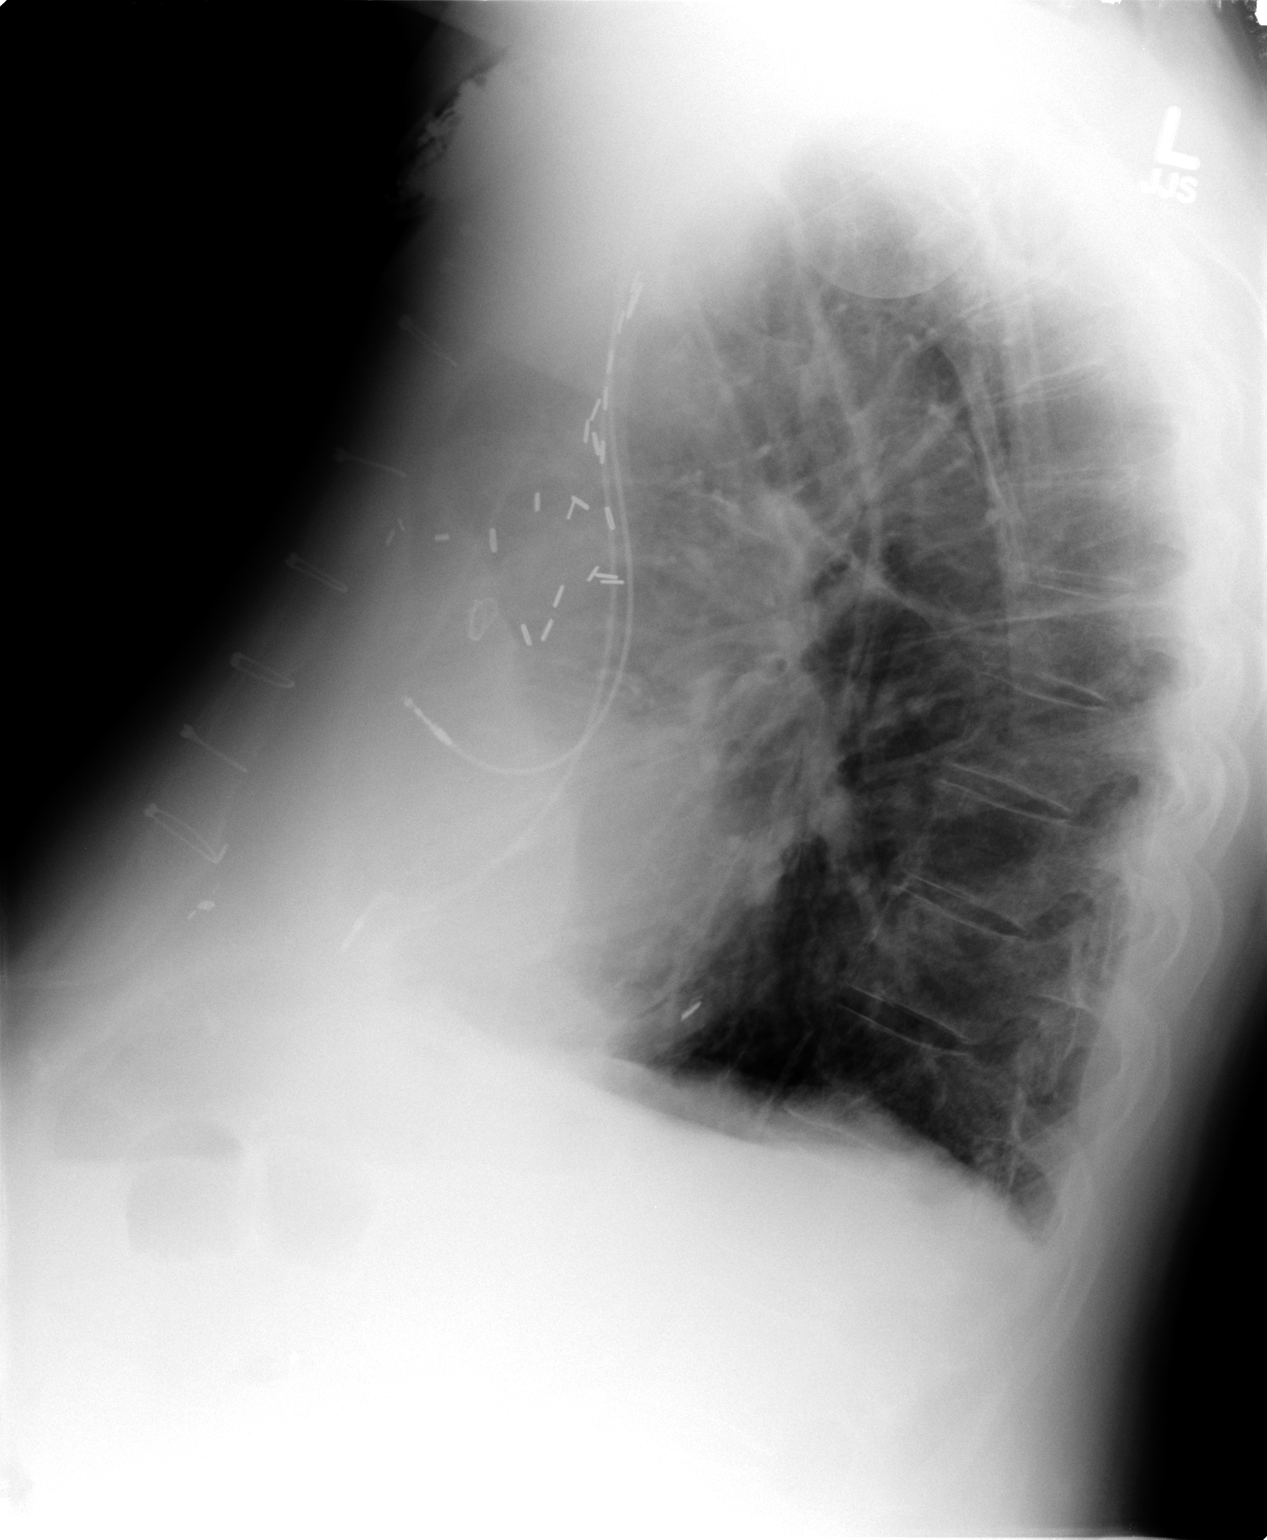

[2 of 2 positions shown; findings below may reference images not displayed]

FINDINGS: Chronic interstitial markings/emphysematous changes.
Bibasilar atelectasis / scarring.  Stable patchy right mid lung
opacity, partially obscured by the overlying right subclavian
pacemaker, likely reflecting focal scarring.

Mild cardiomegaly. Postsurgical changes related to prior CABG.

Mild degenerative changes of the visualized thoracolumbar spine.
IMPRESSION: Stable patchy right midlung opacity, likely reflecting focal
scarring.

Chronic interstitial markings/emphysematous changes.

Mild cardiomegaly.

## 2013-03-23 ENCOUNTER — Encounter: Payer: Self-pay | Admitting: Cardiovascular Disease

## 2013-03-23 ENCOUNTER — Ambulatory Visit (INDEPENDENT_AMBULATORY_CARE_PROVIDER_SITE_OTHER): Payer: Medicare Other | Admitting: Cardiovascular Disease

## 2013-03-23 VITALS — BP 130/68 | HR 72 | Resp 16 | Ht 74.0 in | Wt 251.3 lb

## 2013-03-23 DIAGNOSIS — I5043 Acute on chronic combined systolic (congestive) and diastolic (congestive) heart failure: Secondary | ICD-10-CM

## 2013-03-23 DIAGNOSIS — Z950 Presence of cardiac pacemaker: Secondary | ICD-10-CM

## 2013-03-23 DIAGNOSIS — I4891 Unspecified atrial fibrillation: Secondary | ICD-10-CM

## 2013-03-23 DIAGNOSIS — I509 Heart failure, unspecified: Secondary | ICD-10-CM

## 2013-03-23 DIAGNOSIS — I480 Paroxysmal atrial fibrillation: Secondary | ICD-10-CM

## 2013-03-23 DIAGNOSIS — I472 Ventricular tachycardia: Secondary | ICD-10-CM

## 2013-03-23 LAB — MDC_IDC_ENUM_SESS_TYPE_INCLINIC
Battery Impedance: 1900 Ohm
Battery Voltage: 2.86 V
Lead Channel Impedance Value: 434 Ohm
Lead Channel Pacing Threshold Amplitude: 0.75 V
Lead Channel Sensing Intrinsic Amplitude: 1.7 mV
Lead Channel Sensing Intrinsic Amplitude: 12 mV
Lead Channel Setting Pacing Amplitude: 2 V
Lead Channel Setting Pacing Amplitude: 2.5 V
Lead Channel Setting Pacing Pulse Width: 0.5 ms

## 2013-03-23 NOTE — Assessment & Plan Note (Addendum)
Previously his burden of atrial fibrillation has been extremely low, with most episodes lasting for only a few seconds at a time. Last week he had an episode of atrial fibrillation that lasted for 37 hours. He had good rate control and was unaware of the arrhythmia. This happened after he began exhibiting edema and I suspect it is a consequence of decompensated heart failure rather than a cause of it. He is on appropriate anticoagulation for liquids. He had an embolic stroke recently. The Eliquis should only be stopped when really necessary such as for bleeding complications and major surgeries. The duration of anticoagulation cessation should be kept as brief as possible to reduce the risk of recurrent stroke.

## 2013-03-23 NOTE — Assessment & Plan Note (Addendum)
His dual-chamber device is functioning normally and has approximately 2 years of remaining battery longevity. He has roughly 29% ventricular pacing and 78% atrial pacing. Both atrial and ventricular leads have excellent parameters. As mentioned there was a new episode of paroxysmal atrial fibrillation that lasted for more than a day and a half. Rate control was adequate during that event. No permanent changes were made to device settings.

## 2013-03-23 NOTE — Patient Instructions (Addendum)
Your physician recommends that you keep your follow-up appointment next week with Dr. Herbie Baltimore.  Your physician has recommended you make the following change in your medication: furosemide 40 mg twice daily.  Your physician recommends that you weigh, daily, at the same time every day, and in the same amount of clothing. Please record your daily weights on the handout provided and bring it to your next appointment.   Your physician recommends that you schedule a follow-up appointment on 06-24-2013 @ 2:30pm with the device clinic on [facility], and in 12 months with Dr. Royann Shivers

## 2013-03-26 ENCOUNTER — Telehealth: Payer: Self-pay | Admitting: Cardiology

## 2013-03-26 MED ORDER — FUROSEMIDE 20 MG PO TABS: 40.0000 mg | ORAL_TABLET | Freq: Two times a day (BID) | ORAL | Status: DC

## 2013-03-26 NOTE — Telephone Encounter (Signed)
Returned call and pt verified x 2 w/ pt's son, [name].  Stated they were in the office Monday and pt's furosemide was supposed to be changed to 80 mg a day.  Reviewed chart and AVS stated to increase to 40 mg BID.  Son informed script will be sent.  Dr. Royann Shivers notified and stated med may be adjusted up and down and prefers pt keep 20 mg tabs.  Son informed Rx will be sent for 20 mg tabs: take 2 tabs twice daily for 90-day supply.  Verbalized understanding and agreed w/ plan.  Rx sent to pharmacy.

## 2013-03-26 NOTE — Telephone Encounter (Signed)
Please call-concerning his prescription being changed.

## 2013-03-29 NOTE — Progress Notes (Signed)
Patient ID: Richard Chavez, male   DOB: 06-07-1926, 79 y.o.   MRN: [id number]      Reason for office visit Pacemaker follow up  Mr. Dingley a history of coronary artery disease status post CABG in 71, pacemaker placement for bradycardia,  a right-sided stroke in May of this year at which time he was definitively diagnosed with paroxysmal atrial fibrillation. He is here for pacemaker follow up.   No Known Allergies  Current Outpatient Prescriptions  Medication Sig Dispense Refill  . albuterol-ipratropium (COMBIVENT) 18-103 MCG/ACT inhaler Inhale 2 puffs into the lungs 2 (two) times daily.       Marland Kitchen apixaban (ELIQUIS) 5 MG TABS tablet Take 1 tablet (5 mg total) by mouth 2 (two) times daily.  180 tablet  3  . atorvastatin (LIPITOR) 20 MG tablet Take 20 mg by mouth daily with breakfast.       . docusate sodium 100 MG CAPS Take 100 mg by mouth daily.  10 capsule  0  . fluticasone (FLONASE) 50 MCG/ACT nasal spray Place 2 sprays into the nose 2 (two) times daily as needed. For dryness.      . Fluticasone-Salmeterol (ADVAIR DISKUS) 500-50 MCG/DOSE AEPB Inhale 1 puff into the lungs every 12 (twelve) hours.       . gabapentin (NEURONTIN) 300 MG capsule Take 600 mg by mouth 3 (three) times daily.       Marland Kitchen guaiFENesin (MUCINEX) 600 MG 12 hr tablet Take 1,200 mg by mouth 2 (two) times daily.      Marland Kitchen loratadine (CLARITIN) 10 MG tablet Take 10 mg by mouth daily with breakfast.       . Magnesium Chloride (SLOW-MAG) 535 (64 MG) MG TBCR Take 1 tablet by mouth 2 (two) times daily.       . metoprolol (LOPRESSOR) 50 MG tablet Take 50 mg by mouth 2 (two) times daily.      . Omega-3 Fatty Acids (FISH OIL) 1000 MG CAPS Take 1 capsule by mouth daily.       . potassium chloride (K-DUR,KLOR-CON) 10 MEQ tablet Take 10 mEq by mouth 2 (two) times daily.      . predniSONE (DELTASONE) 5 MG tablet Take 5 mg by mouth daily.      . ranitidine (ZANTAC) 150 MG capsule Take 150 mg by mouth 2 (two) times daily.       Marland Kitchen tiotropium  (SPIRIVA) 18 MCG inhalation capsule Place 18 mcg into inhaler and inhale daily with breakfast.       . Vitamin D, Ergocalciferol, (DRISDOL) 50000 UNITS CAPS Take 50,000 Units by mouth every 7 (seven) days. Take on Fridays      . vitamin E 1000 UNIT capsule Take 1,000 Units by mouth daily with breakfast.       . furosemide (LASIX) 20 MG tablet Take 2 tablets (40 mg total) by mouth 2 (two) times daily. As directed.  360 tablet  3   No current facility-administered medications for this visit.    Past Medical History  Diagnosis Date  . CAD (coronary artery disease), native coronary artery     Status post CABG  . Hypertension   . Hyperlipidemia   . GERD (gastroesophageal reflux disease)   . COPD (chronic obstructive pulmonary disease)   . CRF (chronic renal failure)   . Barrett's esophagus   . OP (osteoporosis)   . CAD (coronary artery disease) of artery bypass graft 07/04/2011    LIMA-LAD; SVG-OM2-OM3, SVG-RCA -- all patent as of 05/27/11  .  Syncope 07/04/2011  . Pacemaker 07/04/2011  . Cardiomyopathy, ischemic 07/2011; Sep 16, 2012    2D Echo - EF 35-40, mod dilated LA; Relook Echo Sep 16, 2012: EF 40-45%  . Myocardial infarction     25 yrs ago  . Peripheral vascular disease   . Arthritis   . BPH (benign prostatic hyperplasia)   . Polyp of colon     removed  . Sleep apnea     STOP BANG SCORE 4  . AV block, 3rd degree     Post St. Jude pacemaker, EF 35-40, does have intermittent atrial tachycardia, either PAt or Afib  . BPH (benign prostatic hypertrophy) 11/15/2011  . SOB (shortness of breath) on exertion 02/2010    2D Echo - EF 35-45,   . Paroxysmal atrial fibrillation - now rate controlled (formally with [facility] and Hospital) 10/03/2012  . CVA (cerebral infarction) 10/09/2012    Noted to be in atrial fibrillation with evaluation of pacemaker, prior to CVA. Right-sided MCA stroke with left arm weakness/mild paralysis -- notably improved     Past Surgical History  Procedure Laterality Date  .  Pacemaker placement  Sep 16, 2005  . Cholecystectomy  09/17/03  . Coronary artery bypass graft  1978    X3  . Lesion excision      from penis  . Cardiac catheterization  06/06/2011    occluded proximal RCA, ostial LAD and mid Circumflex after OM 1.; [place] LIMA-LAD, patent SVG-OM1-OM 2, patent SVG-RCA. Reduced ejection fraction of 30-35%.  . Cardiac catheterization  06/14/2010    LIMA to LAD, SVG to RCA, SVG to OM1 Circumflex, 100% occluded RCA, 100% occluded circumfkex, 100% occluded LAD, no evidence of graft dysfunction, continue medical therapy  . Cardiac catheterization  04/12/2003    3 vessel coronary artery disease, continue medical treatment  . Cardiac catheterization  10/07/2000    Severe 3 vessel coronary artery disease, continue medical treatment  . Doppler echocardiography  10/02/2012    EF 40-45%; possible grade 1 diastolic dysfunction, but A. fib precludes this. Mild atrial dilatation bilaterally. Moderately elevated pulmonary pressures of 40 mmHg.    Family History  Problem Relation Age of Onset  . Leukemia Father   . Heart disease Father   . Alzheimer's disease Mother   . Arthritis Mother   . Lung cancer Son 103  . Alzheimer's disease Maternal Grandmother     History   Social History  . Marital Status: Widowed    Spouse Name: N/A    Number of Children: 1  . Years of Education: 8   Occupational History  . RETIRED [employer]   Social History Main Topics  . Smoking status: Former Smoker -- 0.30 packs/day for 15 years    Types: Cigarettes    Quit date: 05/07/1985  . Smokeless tobacco: Never Used  . Alcohol Use: No  . Drug Use: No  . Sexual Activity: No   Other Topics Concern  . Not on file   Social History Narrative   Patient's son Drake Wuertz lives with him at home.   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    Review of systems: The patient specifically denies any chest pain at rest or with exertion, dyspnea at rest or with exertion, orthopnea,  paroxysmal nocturnal dyspnea, syncope, palpitations, focal neurological deficits, intermittent claudication, lower extremity edema, unexplained weight gain, cough, hemoptysis or wheezing.  The patient also denies abdominal pain, nausea, vomiting, dysphagia, diarrhea, constipation, polyuria, polydipsia, dysuria, hematuria, frequency, urgency, abnormal bleeding or bruising, fever, chills, unexpected weight changes, mood swings, change in skin or hair texture, change in voice quality, auditory or visual problems, allergic reactions or rashes, new musculoskeletal complaints other than usual "aches and pains".   PHYSICAL EXAM BP 130/68  Pulse 72  Resp 16  Ht 6\' 2"  (1.88 m)  Wt 251 lb 4.8 oz (113.989 kg)  BMI 32.25 kg/m2 General appearance: A&O x 3, cooperative, appears stated age, no distress, mildly obese Head: no evidence of trauma, PERRL, EOMI, no exophtalmos or lid lag, no myxedema, no xanthelasma; normal ears, nose and oropharynx Neck: normal jugular venous pulsations and no hepatojugular reflux; brisk carotid pulses without delay and no carotid bruits Chest: clear to auscultation, no signs of consolidation by percussion or palpation, normal fremitus, symmetrical and full respiratory excursions Cardiovascular: normal position and quality of the apical impulse, regular rhythm, normal first and second heart sounds, no murmurs, rubs or gallops Abdomen: no tenderness or distention, no masses by palpation, no abnormal pulsatility or arterial bruits, normal bowel sounds, no hepatosplenomegaly Extremities:  Extremities: edema Trace bilateral, no ulcers, gangrene or trophic changes and venous stasis dermatitis noted; 2+ radial, ulnar and brachial pulses bilaterally; 2+ right femoral, posterior tibial and  dorsalis pedis pulses; 2+ left femoral, posterior tibial and dorsalis pedis pulses; no subclavian or femoral bruits Neurological:  Mental status: Alert, oriented, thought content appropriate, affect: normal ; left hand grip is up to 3-4/5, and his arm strength is 4-5/5. He does have decreased fine touch and fine motor movement of the hand.   EKG: A paced V sensed, possible inferior MI (old)  Lipid Panel     Component Value Date/Time   CHOL 92 10/02/2012 0820   TRIG 106 10/02/2012 0820   HDL 45 10/02/2012 0820   CHOLHDL 2.0 10/02/2012 0820   VLDL 21 10/02/2012 0820   LDLCALC 26 10/02/2012 0820    BMET    Component Value Date/Time   NA 138 10/13/2012 0529   K 4.1 10/13/2012 0529   CL 105 10/13/2012 0529   CO2 25 10/13/2012 0529   GLUCOSE 85 10/13/2012 0529   BUN 21 10/13/2012 0529   CREATININE 1.21 10/13/2012 0529   CALCIUM 8.5 10/13/2012 0529   GFRNONAA 54* 10/13/2012 0529   GFRAA 62* 10/13/2012 0529     ASSESSMENT AND PLAN Paroxysmal atrial fibrillation Previously his burden of atrial fibrillation has been extremely low, with most episodes lasting for only a few seconds at a time. Last week he had an episode of atrial fibrillation that lasted for 37 hours. He had good rate control and was unaware of the arrhythmia. This happened after he began exhibiting edema and I suspect it is a consequence of decompensated heart failure rather than a cause of it. He is on appropriate anticoagulation for liquids. He had an embolic stroke recently. The Eliquis should only be stopped when really necessary such as for bleeding complications and major surgeries. The duration of anticoagulation cessation should be kept as brief as possible to reduce the risk of recurrent stroke.   Pacemaker, St Jude, implanted 2008 His dual-chamber device is functioning normally and has approximately 2 years of remaining battery longevity. He has roughly 29% ventricular pacing and 78% atrial pacing. Both atrial and ventricular leads have  excellent parameters. As mentioned there was a new episode of paroxysmal atrial fibrillation that lasted for more than a day and a half. Rate control was adequate during that event. No permanent changes were made to device settings.   Orders Placed This Encounter  Procedures  . Implantable device check   No orders of the defined types were placed in this encounter.    Junious Silk, MD, [facility] [phone] office [phone] pager

## 2013-03-30 ENCOUNTER — Ambulatory Visit (INDEPENDENT_AMBULATORY_CARE_PROVIDER_SITE_OTHER): Payer: Medicare Other | Admitting: Cardiology

## 2013-03-30 ENCOUNTER — Encounter: Payer: Self-pay | Admitting: Cardiology

## 2013-03-30 VITALS — BP 130/60 | HR 76 | Ht 74.5 in | Wt 247.2 lb

## 2013-03-30 DIAGNOSIS — E669 Obesity, unspecified: Secondary | ICD-10-CM

## 2013-03-30 DIAGNOSIS — R609 Edema, unspecified: Secondary | ICD-10-CM

## 2013-03-30 DIAGNOSIS — R600 Localized edema: Secondary | ICD-10-CM

## 2013-03-30 DIAGNOSIS — I5042 Chronic combined systolic (congestive) and diastolic (congestive) heart failure: Secondary | ICD-10-CM

## 2013-03-30 DIAGNOSIS — I2589 Other forms of chronic ischemic heart disease: Secondary | ICD-10-CM

## 2013-03-30 DIAGNOSIS — I25810 Atherosclerosis of coronary artery bypass graft(s) without angina pectoris: Secondary | ICD-10-CM

## 2013-03-30 DIAGNOSIS — I480 Paroxysmal atrial fibrillation: Secondary | ICD-10-CM

## 2013-03-30 DIAGNOSIS — I4891 Unspecified atrial fibrillation: Secondary | ICD-10-CM

## 2013-03-30 DIAGNOSIS — I255 Ischemic cardiomyopathy: Secondary | ICD-10-CM

## 2013-03-30 DIAGNOSIS — I509 Heart failure, unspecified: Secondary | ICD-10-CM

## 2013-03-30 DIAGNOSIS — Z79899 Other long term (current) drug therapy: Secondary | ICD-10-CM

## 2013-03-30 DIAGNOSIS — N183 Chronic kidney disease, stage 3 unspecified: Secondary | ICD-10-CM

## 2013-03-30 LAB — COMPREHENSIVE METABOLIC PANEL
BUN: 23 mg/dL (ref 6–23)
CO2: 27 mEq/L (ref 19–32)
Glucose, Bld: 85 mg/dL (ref 70–99)
Sodium: 138 mEq/L (ref 135–145)
Total Bilirubin: 1 mg/dL (ref 0.3–1.2)
Total Protein: 6.8 g/dL (ref 6.0–8.3)

## 2013-03-30 LAB — MAGNESIUM: Magnesium: 2.2 mg/dL (ref 1.5–2.5)

## 2013-03-30 NOTE — Progress Notes (Signed)
PATIENT: Richard Chavez MRN: [id number]  DOB: 1926/09/23   DOV:04/01/2013 PCP: Thayer Headings, MD  Clinic Note: Chief Complaint  Patient presents with  . Follow-up    3 months:  No chest pain or dizziness.  +SOB w/minimal activity.  +bilateral ankle edema.  Constipation not willing to take the stool softener daily.  Bloated w/gas w/frequent passing of gas.   HPI: Richard Chavez is a 79 y.o.  male with a PMH below who presents today for close followup of multiple cardiac issues. He was actually seen by Dr. Royann Shivers last week for device check. She was found to be several pounds overweight with significant edema. Dr. Royann Shivers had him increase his Lasix dose, and he has had a relatively significant diuresis with weight coming down 4 pounds. His son Richard Chavez has a card showing progressive weight loss since seeing Dr. Salena Saner. As you recall he is a very pleasant gentleman with a history of coronary artery disease with CABG, ischemic cardiomyopathy with chronic edema. He was recently diagnosed finally with having nature fibrillation, unfortunately in the setting of having a right-sided stroke with now really minimal left-sided residual weakness of the left arm. Since I last saw on his head lower extremity arterial and venous Dopplers performed that had no evidence of superficial venous reflux on the right, but mild insufficiency noted at the junction of the left small saphenous vein. However this is too small in size for intervention. Recommendation: Support stockings The arterial Dopplers showed ABIs of 1.2 on the right and more 0.0 on the left. There is a mild aneurysmal dilation of the distal aorta (3.6 x 3.6 cm). The left anterior tibial artery appeared to be occluded following a less than 50% popliteal stenosis. Carotid Doppler done of the time his stroke in May showed right internal carotid 40-59% stenosis with 60 units at 9% in the left internal carotid.  Interval History: Zafir as usual really doesn't have too  much we have any complaints. Maybe as he does have significant lower edema. He is not wearing his compression stockings today.  He denies any PND or orthopnea, and says his breathing is definitely improved since she increased his Lasix to 40 twice a day. He is just getting tired of how much he is urinating. He denies any chest tightness or pressure with rest or exertion. No sensation of palpitations or rapid heart beats. No recent TIA or amaurosis fugax symptoms. No melena, hematochezia or hematuria on Eliquis. He also denies any claudication symptoms. Just that his legs ache at night.  Past Medical History  Diagnosis Date  . CAD (coronary artery disease), native coronary artery     Status post CABG  . Hypertension   . Hyperlipidemia   . GERD (gastroesophageal reflux disease)   . COPD (chronic obstructive pulmonary disease)   . CRF (chronic renal failure)   . Barrett's esophagus   . OP (osteoporosis)   . CAD (coronary artery disease) of artery bypass graft 07/04/2011    LIMA-LAD; SVG-OM2-OM3, SVG-RCA -- all patent as of 05/27/11  . Syncope 07/04/2011  . Pacemaker 07/04/2011  . Cardiomyopathy, ischemic 07/2011; 09/2012    2D Echo - EF 35-40, mod dilated LA; Relook Echo 09/2012: EF 40-45%  . Myocardial infarction     25 yrs ago  . Peripheral vascular disease   . Arthritis   . BPH (benign prostatic hyperplasia)   . Polyp of colon     removed  . Sleep apnea     STOP BANG  SCORE 4  . AV block, 3rd degree     Post St. Jude pacemaker, EF 35-40, does have intermittent atrial tachycardia, either PAt or Afib  . BPH (benign prostatic hypertrophy) 11/15/2011  . Paroxysmal atrial fibrillation - now rate controlled (formally with [facility] and Hospital) 10/03/2012  . CVA (cerebral infarction) 10/09/2012    Noted to be in atrial fibrillation with evaluation of pacemaker, prior to CVA. Right-sided MCA stroke with left arm weakness/mild paralysis -- notably improved     Prior Cardiac Evaluation and Past Surgical  History: Past Surgical History  Procedure Laterality Date  . Pacemaker placement  2007  . Cholecystectomy  2005  . Coronary artery bypass graft  1978    X3  . Lesion excision      from penis  . Cardiac catheterization  06/06/2011    occluded proximal RCA, ostial LAD and mid Circumflex after OM 1.; [place] LIMA-LAD, patent SVG-OM1-OM 2, patent SVG-RCA. Reduced ejection fraction of 30-35%.  . Cardiac catheterization  06/14/2010    LIMA to LAD, SVG to RCA, SVG to OM1 Circumflex, 100% occluded RCA, 100% occluded circumfkex, 100% occluded LAD, no evidence of graft dysfunction, continue medical therapy  . Cardiac catheterization  04/12/2003    3 vessel coronary artery disease, continue medical treatment  . Cardiac catheterization  10/07/2000    Severe 3 vessel coronary artery disease, continue medical treatment  . Doppler echocardiography  10/02/2012    EF 40-45%; possible grade 1 diastolic dysfunction, but A. fib precludes this. Mild atrial dilatation bilaterally. Moderately elevated pulmonary pressures of 40 mmHg.    No Known Allergies  Current Outpatient Prescriptions  Medication Sig Dispense Refill  . albuterol-ipratropium (COMBIVENT) 18-103 MCG/ACT inhaler Inhale 2 puffs into the lungs 2 (two) times daily.       Marland Kitchen apixaban (ELIQUIS) 5 MG TABS tablet Take 1 tablet (5 mg total) by mouth 2 (two) times daily.  180 tablet  3  . atorvastatin (LIPITOR) 20 MG tablet Take 20 mg by mouth daily with breakfast.       . docusate sodium 100 MG CAPS Take 100 mg by mouth daily.  10 capsule  0  . fluticasone (FLONASE) 50 MCG/ACT nasal spray Place 2 sprays into the nose 2 (two) times daily as needed. For dryness.      . Fluticasone-Salmeterol (ADVAIR DISKUS) 500-50 MCG/DOSE AEPB Inhale 1 puff into the lungs every 12 (twelve) hours.       . furosemide (LASIX) 20 MG tablet Take 2 tablets (40 mg total) by mouth 2 (two) times daily. As directed.  360 tablet  3  . gabapentin (NEURONTIN) 300 MG capsule Take 600 mg  by mouth 3 (three) times daily.       Marland Kitchen guaiFENesin (MUCINEX) 600 MG 12 hr tablet Take 1,200 mg by mouth 2 (two) times daily.      Marland Kitchen loratadine (CLARITIN) 10 MG tablet Take 10 mg by mouth daily with breakfast.       . Magnesium Chloride (SLOW-MAG) 535 (64 MG) MG TBCR Take 1 tablet by mouth 2 (two) times daily.       . metoprolol (LOPRESSOR) 50 MG tablet Take 50 mg by mouth 2 (two) times daily.      . Omega-3 Fatty Acids (FISH OIL) 1000 MG CAPS Take 1 capsule by mouth daily.       . potassium chloride (K-DUR,KLOR-CON) 10 MEQ tablet Take 10 mEq by mouth 2 (two) times daily.      . predniSONE (DELTASONE) 5  MG tablet Take 5 mg by mouth daily.      . ranitidine (ZANTAC) 150 MG capsule Take 150 mg by mouth 2 (two) times daily.       Marland Kitchen tiotropium (SPIRIVA) 18 MCG inhalation capsule Place 18 mcg into inhaler and inhale daily with breakfast.       . Vitamin D, Ergocalciferol, (DRISDOL) 50000 UNITS CAPS Take 50,000 Units by mouth every 7 (seven) days. Take on Fridays      . vitamin E 1000 UNIT capsule Take 1,000 Units by mouth daily with breakfast.        No current facility-administered medications for this visit.    History   Social History Narrative   Patient's son Chue Berkovich lives with him at home.   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   ROS: A comprehensive Review of Systems - Negative except Symptoms noted in history of present illness. Otherwise just mild arthritic pains.  PHYSICAL EXAM BP 130/60  Pulse 76  Ht 6' 2.5" (1.892 m)  Wt 247 lb 3.2 oz (112.129 kg)  BMI 31.32 kg/m2 General: A&O x 3, cooperative, appears stated age, no distress, mildly obese.  Hard of hearing. Also difficult to comprehend his speech .  HEENT: [place]/AT, EOMI, MMM, anicteric sclera; he has puffy bags  under his eyes. Neck: no carotid bruit, no JVD and supple, symmetrical, trachea midline  Lungs: CTA B., normal percussion bilaterally and With only mildly decreased bibasal breath sounds. Mild interstitial sounds but no W./R./R.  Heart: normal apical impulse, regular rate with irregularly irregular rhythm. Normal S1, S2 with no M./R./G.  Abdomen: soft, non-tender; bowel sounds normal; no masses, no organomegaly and Obese  Extremities: 3+ pedal and ankle edema.  Unable to really palpate pedal pulses. Radial Pulses: 2+ and symmetric  Neurologic: Mental status: Alert, oriented, thought content appropriate, affect: normal ; left hand grip is up to4/5, and his arm strength is 4-5/5. He does have decreased fine touch and fine motor movement of the hand.  Cranial nerves: normal   [id number] today: No  Recent Labs: Ordered on the day of his visit. We'll review in Epic  ASSESSMENT / PLAN: Edema of both legs Dr. Royann Shivers had increased his Lasix to 40 twice a day.  I think we might be noted still present, we still have a ways to go. I've asked that he continue the current dosing through the end of this week. Then return to 40 mg once a day or 20 twice a day when the weight gets back to her baseline which is 238-240 pounds. He currently weighs 242 pounds at home (which is consistent with 247 pounds here). We then talked about sliding scale Lasix.  He'll continue to wear his compression stockings as there does not seem to be any procedural technique for improving his edema.  Chronic kidney disease, stage III (moderate) With the increased dose of Lasix, I have ordered a CMP for this afternoon.  Chronic combined systolic and diastolic congestive heart failure Is definitely present, and the findings on his pacemaker would suggest that he has increased filling pressures. He however is not overly symptomatic as far as any PND orthopnea goes. This is an improvement from last week.  Lasix dosing as  noted  above. Monitor renal function closely.  Cardiomyopathy, ischemic, EF 30-35% by cath; 40-45 by echocardiogram in May 2014 Improved EF on recent echocardiogram.  Dry weight 238-240 pounds on her home scale. On beta blocker, without ACE inhibitor/ARB to 2 hypotension in the past and due to renal insufficiency as well. We're allowing for some increased pressures to allow for renal perfusion.  Paroxysmal atrial fibrillation Adequate rate control with the last episode that was quite prolonged. Dr. Royann Shivers seems to think that to him the A. fib is more of a response to volume overload/decompensation of heart failure as opposed to the cause of.  Continue with rate control using beta blocker. As long as his rate controlled he really doesn't know these in A. fib or not. Continue anticoagulation with Eliquis.  Obesity (BMI 30-39.9) I again counseled him and his son about dietary modifications. The main thing is he just simply is not able to exercise.  CAD CABG 1998, patent grafts 06/06/11 after abn Myoview No active anginal symptoms. He is on a beta blocker, statin plus Fishel. He is not on aspirin while on Eliquis.    Orders Placed This Encounter  Procedures  . Comp Met (CMET)  . Magnesium   No orders of the defined types were placed in this encounter.    Followup: With Dr. Royann Shivers in February as scheduled. Then when with Dr. Herbie Baltimore in April of 2015  Teresa Lemmerman W. Herbie Baltimore, M.D., M.S. [facility] [address] [place], [place]  [address]  [phone] Pager # [phone]

## 2013-03-30 NOTE — Patient Instructions (Signed)
Your ideal dry weight is 238 lbs.  Please weigh yourself at the same time each day and keep a log to bring in with you at your next appointment.   Continue furosemide as is until dry weight is achieved then go back to 20mg  bid.  See Dr. Royann Shivers in February.  Dr. Herbie Baltimore recommends that you schedule a follow-up appointment in: April 2015.  Have lab work done today.  We will call you with the results.

## 2013-03-31 ENCOUNTER — Telehealth: Payer: Self-pay | Admitting: *Deleted

## 2013-03-31 DIAGNOSIS — Z79899 Other long term (current) drug therapy: Secondary | ICD-10-CM

## 2013-03-31 NOTE — Telephone Encounter (Signed)
Message copied by Vita Barley on Tue Mar 31, 2013  2:22 PM ------      Message from: [place], [name] W      Created: Mon Mar 30, 2013 11:34 PM       Renal function was a little bit worse. We'll need to recheck chemistry panel, the Monday following Thanksgiving. ------

## 2013-03-31 NOTE — Telephone Encounter (Signed)
Lab results called to patient.  Will get labs rechecked Monday Dec. 1st,  [facility] floor.

## 2013-04-01 ENCOUNTER — Emergency Department (HOSPITAL_COMMUNITY): Payer: Medicare Other

## 2013-04-01 ENCOUNTER — Inpatient Hospital Stay (HOSPITAL_COMMUNITY)
Admission: EM | Admit: 2013-04-01 | Discharge: 2013-04-03 | DRG: 194 | Disposition: A | Discharge status: Home / Self Care | Payer: Medicare Other | Attending: Internal Medicine | Admitting: Internal Medicine

## 2013-04-01 ENCOUNTER — Encounter: Payer: Self-pay | Admitting: Cardiology

## 2013-04-01 ENCOUNTER — Inpatient Hospital Stay (HOSPITAL_COMMUNITY): Payer: Medicare Other

## 2013-04-01 ENCOUNTER — Encounter (HOSPITAL_COMMUNITY): Payer: Self-pay | Admitting: Emergency Medicine

## 2013-04-01 DIAGNOSIS — G4730 Sleep apnea, unspecified: Secondary | ICD-10-CM | POA: Diagnosis present

## 2013-04-01 DIAGNOSIS — E669 Obesity, unspecified: Secondary | ICD-10-CM | POA: Insufficient documentation

## 2013-04-01 DIAGNOSIS — Z950 Presence of cardiac pacemaker: Secondary | ICD-10-CM

## 2013-04-01 DIAGNOSIS — G4733 Obstructive sleep apnea (adult) (pediatric): Secondary | ICD-10-CM

## 2013-04-01 DIAGNOSIS — I639 Cerebral infarction, unspecified: Secondary | ICD-10-CM

## 2013-04-01 DIAGNOSIS — E7800 Pure hypercholesterolemia, unspecified: Secondary | ICD-10-CM

## 2013-04-01 DIAGNOSIS — Z79899 Other long term (current) drug therapy: Secondary | ICD-10-CM

## 2013-04-01 DIAGNOSIS — I129 Hypertensive chronic kidney disease with stage 1 through stage 4 chronic kidney disease, or unspecified chronic kidney disease: Secondary | ICD-10-CM | POA: Diagnosis present

## 2013-04-01 DIAGNOSIS — Z951 Presence of aortocoronary bypass graft: Secondary | ICD-10-CM

## 2013-04-01 DIAGNOSIS — J4489 Other specified chronic obstructive pulmonary disease: Secondary | ICD-10-CM | POA: Diagnosis present

## 2013-04-01 DIAGNOSIS — I6381 Other cerebral infarction due to occlusion or stenosis of small artery: Secondary | ICD-10-CM

## 2013-04-01 DIAGNOSIS — N189 Chronic kidney disease, unspecified: Secondary | ICD-10-CM | POA: Diagnosis present

## 2013-04-01 DIAGNOSIS — K219 Gastro-esophageal reflux disease without esophagitis: Secondary | ICD-10-CM | POA: Diagnosis present

## 2013-04-01 DIAGNOSIS — Z87891 Personal history of nicotine dependence: Secondary | ICD-10-CM

## 2013-04-01 DIAGNOSIS — Z8673 Personal history of transient ischemic attack (TIA), and cerebral infarction without residual deficits: Secondary | ICD-10-CM

## 2013-04-01 DIAGNOSIS — I10 Essential (primary) hypertension: Secondary | ICD-10-CM | POA: Diagnosis present

## 2013-04-01 DIAGNOSIS — I509 Heart failure, unspecified: Secondary | ICD-10-CM

## 2013-04-01 DIAGNOSIS — J189 Pneumonia, unspecified organism: Principal | ICD-10-CM | POA: Diagnosis present

## 2013-04-01 DIAGNOSIS — G8110 Spastic hemiplegia affecting unspecified side: Secondary | ICD-10-CM

## 2013-04-01 DIAGNOSIS — I739 Peripheral vascular disease, unspecified: Secondary | ICD-10-CM

## 2013-04-01 DIAGNOSIS — I255 Ischemic cardiomyopathy: Secondary | ICD-10-CM

## 2013-04-01 DIAGNOSIS — R55 Syncope and collapse: Secondary | ICD-10-CM

## 2013-04-01 DIAGNOSIS — I252 Old myocardial infarction: Secondary | ICD-10-CM

## 2013-04-01 DIAGNOSIS — I2589 Other forms of chronic ischemic heart disease: Secondary | ICD-10-CM | POA: Diagnosis present

## 2013-04-01 DIAGNOSIS — N179 Acute kidney failure, unspecified: Secondary | ICD-10-CM | POA: Diagnosis present

## 2013-04-01 DIAGNOSIS — K2270 Barrett's esophagus without dysplasia: Secondary | ICD-10-CM

## 2013-04-01 DIAGNOSIS — I2510 Atherosclerotic heart disease of native coronary artery without angina pectoris: Secondary | ICD-10-CM | POA: Diagnosis present

## 2013-04-01 DIAGNOSIS — I480 Paroxysmal atrial fibrillation: Secondary | ICD-10-CM

## 2013-04-01 DIAGNOSIS — J690 Pneumonitis due to inhalation of food and vomit: Secondary | ICD-10-CM

## 2013-04-01 DIAGNOSIS — K56609 Unspecified intestinal obstruction, unspecified as to partial versus complete obstruction: Secondary | ICD-10-CM

## 2013-04-01 DIAGNOSIS — I5042 Chronic combined systolic (congestive) and diastolic (congestive) heart failure: Secondary | ICD-10-CM | POA: Diagnosis present

## 2013-04-01 DIAGNOSIS — N390 Urinary tract infection, site not specified: Secondary | ICD-10-CM

## 2013-04-01 DIAGNOSIS — I25810 Atherosclerosis of coronary artery bypass graft(s) without angina pectoris: Secondary | ICD-10-CM | POA: Diagnosis present

## 2013-04-01 DIAGNOSIS — E785 Hyperlipidemia, unspecified: Secondary | ICD-10-CM | POA: Diagnosis present

## 2013-04-01 DIAGNOSIS — I472 Ventricular tachycardia: Secondary | ICD-10-CM

## 2013-04-01 DIAGNOSIS — N182 Chronic kidney disease, stage 2 (mild): Secondary | ICD-10-CM

## 2013-04-01 DIAGNOSIS — J449 Chronic obstructive pulmonary disease, unspecified: Secondary | ICD-10-CM | POA: Diagnosis present

## 2013-04-01 DIAGNOSIS — R600 Localized edema: Secondary | ICD-10-CM

## 2013-04-01 DIAGNOSIS — D72829 Elevated white blood cell count, unspecified: Secondary | ICD-10-CM | POA: Diagnosis present

## 2013-04-01 LAB — CBC WITH DIFFERENTIAL/PLATELET
Basophils Absolute: 0 10*3/uL (ref 0.0–0.1)
Basophils Relative: 0 % (ref 0–1)
Eosinophils Relative: 0 % (ref 0–5)
HCT: 42.6 % (ref 39.0–52.0)
Lymphs Abs: 1.3 10*3/uL (ref 0.7–4.0)
MCHC: 32.4 g/dL (ref 30.0–36.0)
MCV: 89.5 fL (ref 78.0–100.0)
Monocytes Absolute: 1.6 10*3/uL — ABNORMAL HIGH (ref 0.1–1.0)
Neutro Abs: 13.7 10*3/uL — ABNORMAL HIGH (ref 1.7–7.7)
Platelets: 158 10*3/uL (ref 150–400)
RDW: 16.3 % — ABNORMAL HIGH (ref 11.5–15.5)

## 2013-04-01 LAB — MRSA PCR SCREENING: MRSA by PCR: POSITIVE — AB

## 2013-04-01 LAB — COMPREHENSIVE METABOLIC PANEL
AST: 15 U/L (ref 0–37)
Albumin: 3.2 g/dL — ABNORMAL LOW (ref 3.5–5.2)
Alkaline Phosphatase: 51 U/L (ref 39–117)
BUN: 24 mg/dL — ABNORMAL HIGH (ref 6–23)
CO2: 25 mEq/L (ref 19–32)
Calcium: 8.7 mg/dL (ref 8.4–10.5)
Creatinine, Ser: 1.85 mg/dL — ABNORMAL HIGH (ref 0.50–1.35)
GFR calc Af Amer: 37 mL/min — ABNORMAL LOW (ref >90)
GFR calc non Af Amer: 32 mL/min — ABNORMAL LOW (ref >90)

## 2013-04-01 LAB — GLUCOSE, CAPILLARY: Glucose-Capillary: 97 mg/dL (ref 70–99)

## 2013-04-01 LAB — POCT I-STAT TROPONIN I: Troponin i, poc: 0 ng/mL (ref 0.00–0.08)

## 2013-04-01 LAB — LACTIC ACID, PLASMA: Lactic Acid, Venous: 2.1 mmol/L (ref 0.5–2.2)

## 2013-04-01 MED ORDER — GABAPENTIN 300 MG PO CAPS
600.0000 mg | ORAL_CAPSULE | Freq: Three times a day (TID) | ORAL | Status: DC
  Administered 2013-04-01 – 2013-04-03 (×5): 600 mg via ORAL
  Filled 2013-04-01 (×7): qty 2

## 2013-04-01 MED ORDER — LEVOFLOXACIN IN D5W 750 MG/150ML IV SOLN
750.0000 mg | INTRAVENOUS | Status: DC
  Filled 2013-04-01: qty 150

## 2013-04-01 MED ORDER — ATORVASTATIN CALCIUM 20 MG PO TABS
20.0000 mg | ORAL_TABLET | Freq: Every day | ORAL | Status: DC
  Administered 2013-04-02 – 2013-04-03 (×2): 20 mg via ORAL
  Filled 2013-04-01 (×3): qty 1

## 2013-04-01 MED ORDER — FUROSEMIDE 40 MG PO TABS
40.0000 mg | ORAL_TABLET | Freq: Two times a day (BID) | ORAL | Status: DC
  Administered 2013-04-01 – 2013-04-02 (×2): 40 mg via ORAL
  Filled 2013-04-01 (×4): qty 1

## 2013-04-01 MED ORDER — IPRATROPIUM BROMIDE 0.02 % IN SOLN: 0.5000 mg | RESPIRATORY_TRACT | Status: DC | PRN

## 2013-04-01 MED ORDER — FLUTICASONE PROPIONATE 50 MCG/ACT NA SUSP: 1.0000 | Freq: Two times a day (BID) | NASAL | Status: DC | PRN

## 2013-04-01 MED ORDER — IPRATROPIUM-ALBUTEROL 18-103 MCG/ACT IN AERO: 2.0000 | INHALATION_SPRAY | Freq: Two times a day (BID) | RESPIRATORY_TRACT | Status: DC

## 2013-04-01 MED ORDER — LEVOFLOXACIN IN D5W 250 MG/50ML IV SOLN
250.0000 mg | Freq: Once | INTRAVENOUS | Status: AC
  Administered 2013-04-01: 250 mg via INTRAVENOUS
  Filled 2013-04-01: qty 50

## 2013-04-01 MED ORDER — LEVOFLOXACIN IN D5W 500 MG/100ML IV SOLN
500.0000 mg | Freq: Once | INTRAVENOUS | Status: AC
  Administered 2013-04-01: 500 mg via INTRAVENOUS
  Filled 2013-04-01: qty 100

## 2013-04-01 MED ORDER — TIOTROPIUM BROMIDE MONOHYDRATE 18 MCG IN CAPS: 18.0000 ug | ORAL_CAPSULE | Freq: Every day | RESPIRATORY_TRACT | Status: DC

## 2013-04-01 MED ORDER — SODIUM CHLORIDE 0.9 % IV BOLUS (SEPSIS)
1000.0000 mL | Freq: Once | INTRAVENOUS | Status: AC
  Administered 2013-04-01: 1000 mL via INTRAVENOUS

## 2013-04-01 MED ORDER — MOMETASONE FURO-FORMOTEROL FUM 200-5 MCG/ACT IN AERO
2.0000 | INHALATION_SPRAY | Freq: Two times a day (BID) | RESPIRATORY_TRACT | Status: DC
  Administered 2013-04-01 – 2013-04-02 (×3): 2 via RESPIRATORY_TRACT
  Filled 2013-04-01: qty 8.8

## 2013-04-01 MED ORDER — POLYETHYLENE GLYCOL 3350 17 G PO PACK
17.0000 g | PACK | Freq: Every day | ORAL | Status: DC | PRN
  Filled 2013-04-01 (×2): qty 1

## 2013-04-01 MED ORDER — APIXABAN 2.5 MG PO TABS
2.5000 mg | ORAL_TABLET | Freq: Two times a day (BID) | ORAL | Status: DC
  Administered 2013-04-01 – 2013-04-03 (×4): 2.5 mg via ORAL
  Filled 2013-04-01 (×5): qty 1

## 2013-04-01 MED ORDER — ALBUTEROL SULFATE (5 MG/ML) 0.5% IN NEBU: 2.5000 mg | INHALATION_SOLUTION | RESPIRATORY_TRACT | Status: DC | PRN

## 2013-04-01 MED ORDER — GUAIFENESIN ER 600 MG PO TB12
1200.0000 mg | ORAL_TABLET | Freq: Two times a day (BID) | ORAL | Status: DC
  Administered 2013-04-01 – 2013-04-03 (×4): 1200 mg via ORAL
  Filled 2013-04-01 (×5): qty 2

## 2013-04-01 MED ORDER — APIXABAN 5 MG PO TABS: 5.0000 mg | ORAL_TABLET | Freq: Two times a day (BID) | ORAL | Status: DC

## 2013-04-01 MED ORDER — POTASSIUM CHLORIDE CRYS ER 10 MEQ PO TBCR
10.0000 meq | EXTENDED_RELEASE_TABLET | Freq: Two times a day (BID) | ORAL | Status: DC
  Administered 2013-04-01 – 2013-04-03 (×4): 10 meq via ORAL
  Filled 2013-04-01 (×5): qty 1

## 2013-04-01 MED ORDER — MAGNESIUM HYDROXIDE 400 MG/5ML PO SUSP: 15.0000 mL | Freq: Every day | ORAL | Status: DC | PRN

## 2013-04-01 MED ORDER — SODIUM CHLORIDE 0.9 % IV SOLN
INTRAVENOUS | Status: DC
  Administered 2013-04-01: 18:00:00 via INTRAVENOUS

## 2013-04-01 MED ORDER — LORATADINE 10 MG PO TABS
10.0000 mg | ORAL_TABLET | Freq: Every day | ORAL | Status: DC
  Administered 2013-04-02 – 2013-04-03 (×2): 10 mg via ORAL
  Filled 2013-04-01 (×3): qty 1

## 2013-04-01 NOTE — Assessment & Plan Note (Signed)
Adequate rate control with the last episode that was quite prolonged. Dr. Royann Shivers seems to think that to him the A. fib is more of a response to volume overload/decompensation of heart failure as opposed to the cause of.  Continue with rate control using beta blocker. As long as his rate controlled he really doesn't know these in A. fib or not. Continue anticoagulation with Eliquis.

## 2013-04-01 NOTE — Assessment & Plan Note (Signed)
No active anginal symptoms. He is on a beta blocker, statin plus Fishel. He is not on aspirin while on Eliquis.

## 2013-04-01 NOTE — Assessment & Plan Note (Signed)
Improved EF on recent echocardiogram.  Dry weight 238-240 pounds on her home scale. On beta blocker, without ACE inhibitor/ARB to 2 hypotension in the past and due to renal insufficiency as well. We're allowing for some increased pressures to allow for renal perfusion.

## 2013-04-01 NOTE — Progress Notes (Signed)
ANTIBIOTIC CONSULT NOTE - INITIAL  Pharmacy Consult for Levaquin  Indication: CAP  No Known Allergies  Patient Measurements: Height: 6' 2.5" (189.2 cm) Weight: 245 lb 9.6 oz (111.403 kg) IBW/kg (Calculated) : 83.35 Adjusted Body Weight: n/a   Vital Signs: Temp: 97.9 F (36.6 C) (11/26 1952) Temp src: Oral (11/26 1952) BP: 112/67 mmHg (11/26 1952) Pulse Rate: 75 (11/26 1952) Intake/Output from previous day:   Intake/Output from this shift:    Labs:  Recent Labs  03/30/13 1211 04/01/13 1524  WBC  --  16.6*  HGB  --  13.8  PLT  --  158  CREATININE 1.73* 1.85*   Estimated Creatinine Clearance: 39.8 ml/min (by C-G formula based on Cr of 1.85). No results found for this basename: VANCOTROUGH, VANCOPEAK, VANCORANDOM, GENTTROUGH, GENTPEAK, GENTRANDOM, TOBRATROUGH, TOBRAPEAK, TOBRARND, AMIKACINPEAK, AMIKACINTROU, AMIKACIN,  in the last 72 hours   Microbiology: No results found for this or any previous visit (from the past 720 hour(s)).  Medical History: Past Medical History  Diagnosis Date  . CAD (coronary artery disease), native coronary artery     Status post CABG  . Hypertension   . Hyperlipidemia   . GERD (gastroesophageal reflux disease)   . COPD (chronic obstructive pulmonary disease)   . CRF (chronic renal failure)   . Barrett's esophagus   . OP (osteoporosis)   . CAD (coronary artery disease) of artery bypass graft 07/04/2011    LIMA-LAD; SVG-OM2-OM3, SVG-RCA -- all patent as of 05/27/11  . Syncope 07/04/2011  . Pacemaker 07/04/2011  . Cardiomyopathy, ischemic 07/2011; 09/2012    2D Echo - EF 35-40, mod dilated LA; Relook Echo 09/2012: EF 40-45%  . Myocardial infarction     25 yrs ago  . Peripheral vascular disease   . Arthritis   . BPH (benign prostatic hyperplasia)   . Polyp of colon     removed  . Sleep apnea     STOP BANG SCORE 4  . AV block, 3rd degree     Post St. Jude pacemaker, EF 35-40, does have intermittent atrial tachycardia, either PAt or Afib   . BPH (benign prostatic hypertrophy) 11/15/2011  . Paroxysmal atrial fibrillation - now rate controlled (formally with [facility]) 10/03/2012  . CVA (cerebral infarction) 10/09/2012    Noted to be in atrial fibrillation with evaluation of pacemaker, prior to CVA. Right-sided MCA stroke with left arm weakness/mild paralysis -- notably improved     Medications:  Prescriptions prior to admission  Medication Sig Dispense Refill  . albuterol-ipratropium (COMBIVENT) 18-103 MCG/ACT inhaler Inhale 2 puffs into the lungs 2 (two) times daily.       Marland Kitchen apixaban (ELIQUIS) 5 MG TABS tablet Take 1 tablet (5 mg total) by mouth 2 (two) times daily.  180 tablet  3  . atorvastatin (LIPITOR) 20 MG tablet Take 20 mg by mouth daily with breakfast.       . docusate sodium 100 MG CAPS Take 100 mg by mouth daily.  10 capsule  0  . fluticasone (FLONASE) 50 MCG/ACT nasal spray Place 1 spray into both nostrils 2 (two) times daily as needed for allergies or rhinitis.      . Fluticasone-Salmeterol (ADVAIR DISKUS) 500-50 MCG/DOSE AEPB Inhale 1 puff into the lungs every 12 (twelve) hours.       . furosemide (LASIX) 20 MG tablet Take 40 mg by mouth 2 (two) times daily.      Marland Kitchen gabapentin (NEURONTIN) 300 MG capsule Take 600 mg by mouth 3 (three)  times daily.       Marland Kitchen guaiFENesin (MUCINEX) 600 MG 12 hr tablet Take 1,200 mg by mouth 2 (two) times daily.      Marland Kitchen loratadine (CLARITIN) 10 MG tablet Take 10 mg by mouth daily with breakfast.       . Magnesium Chloride (SLOW-MAG) 535 (64 MG) MG TBCR Take 1 tablet by mouth 2 (two) times daily.       . metoprolol (LOPRESSOR) 50 MG tablet Take 50 mg by mouth 2 (two) times daily.      . Omega-3 Fatty Acids (FISH OIL) 1000 MG CAPS Take 1 capsule by mouth daily.       . potassium chloride (K-DUR,KLOR-CON) 10 MEQ tablet Take 10 mEq by mouth 2 (two) times daily.      . predniSONE (DELTASONE) 5 MG tablet Take 5 mg by mouth daily.      . ranitidine (ZANTAC) 150 MG capsule Take 150 mg by mouth  2 (two) times daily.       Marland Kitchen tiotropium (SPIRIVA) 18 MCG inhalation capsule Place 18 mcg into inhaler and inhale daily with breakfast.       . Vitamin D, Ergocalciferol, (DRISDOL) 50000 UNITS CAPS Take 50,000 Units by mouth every 7 (seven) days. Take on Fridays      . vitamin E 1000 UNIT capsule Take 1,000 Units by mouth daily with breakfast.        Assessment: 72 YOM brought to the ED by his son for increased generalized weakness. CXR on arrival showed increased infiltrates. He has a fever of 100.3 with WBC 16.6. Pharmacy consulted to manage levaquin for community acquired pneumonia. SCr 1.85 (trending up), CrCl ~ 40 mL/min. Patient already received levaquin 500 mg IV x 1 dose today at 1800.   Cultures: 11/26 Sputum Cx>>  Goal of Therapy:  Eradication of infection  Plan:  1) Additional bolus of levaquin 250 mg IV x 1 dose today 2) Levaquin 750 mg IV Q 48 hours to start on 04/03/13 3) F/u CBC, cultures, renal fx, and patient clinical status  Vinnie Level, PharmD.  Clinical Pharmacist Pager [phone]

## 2013-04-01 NOTE — ED Notes (Addendum)
Richard Chavez on 6E updated, unable to obtain Lactic Acid lab draw. Phlebotomy aware as well. Pt states is still unable to void.

## 2013-04-01 NOTE — Assessment & Plan Note (Signed)
With the increased dose of Lasix, I have ordered a CMP for this afternoon.

## 2013-04-01 NOTE — ED Notes (Signed)
CBG 97 

## 2013-04-01 NOTE — Progress Notes (Signed)
ANTIBIOTIC CONSULT NOTE - INITIAL  Pharmacy Consult for Levofloxacin Indication: pneumonia  No Known Allergies  Patient Measurements: Height: 6' 2.5" (189.2 cm) Weight: 247 lb 3.2 oz (112.129 kg) IBW/kg (Calculated) : 83.35   Vital Signs: Temp: 100.3 F (37.9 C) (11/26 1530) Temp src: Rectal (11/26 1530) BP: 105/54 mmHg (11/26 1745) Pulse Rate: 76 (11/26 1745) Intake/Output from previous day:   Intake/Output from this shift:    Labs:  Recent Labs  03/30/13 1211 04/01/13 1524  WBC  --  16.6*  HGB  --  13.8  PLT  --  158  CREATININE 1.73* 1.85*   Estimated Creatinine Clearance: 39.9 ml/min (by C-G formula based on Cr of 1.85). No results found for this basename: VANCOTROUGH, VANCOPEAK, VANCORANDOM, GENTTROUGH, GENTPEAK, GENTRANDOM, TOBRATROUGH, TOBRAPEAK, TOBRARND, AMIKACINPEAK, AMIKACINTROU, AMIKACIN,  in the last 72 hours   Microbiology: No results found for this or any previous visit (from the past 720 hour(s)).  Medical History: Past Medical History  Diagnosis Date  . CAD (coronary artery disease), native coronary artery     Status post CABG  . Hypertension   . Hyperlipidemia   . GERD (gastroesophageal reflux disease)   . COPD (chronic obstructive pulmonary disease)   . CRF (chronic renal failure)   . Barrett's esophagus   . OP (osteoporosis)   . CAD (coronary artery disease) of artery bypass graft 07/04/2011    LIMA-LAD; SVG-OM2-OM3, SVG-RCA -- all patent as of 05/27/11  . Syncope 07/04/2011  . Pacemaker 07/04/2011  . Cardiomyopathy, ischemic 07/2011; 09/2012    2D Echo - EF 35-40, mod dilated LA; Relook Echo 09/2012: EF 40-45%  . Myocardial infarction     25 yrs ago  . Peripheral vascular disease   . Arthritis   . BPH (benign prostatic hyperplasia)   . Polyp of colon     removed  . Sleep apnea     STOP BANG SCORE 4  . AV block, 3rd degree     Post St. Jude pacemaker, EF 35-40, does have intermittent atrial tachycardia, either PAt or Afib  . BPH  (benign prostatic hypertrophy) 11/15/2011  . Paroxysmal atrial fibrillation - now rate controlled ([facility]ally with [facility]) 10/03/2012  . CVA (cerebral infarction) 10/09/2012    Noted to be in atrial fibrillation with evaluation of pacemaker, prior to CVA. Right-sided MCA stroke with left arm weakness/mild paralysis -- notably improved     Medications:   (Not in a hospital admission)  Assessment: 79 yo M admitted 04/01/2013 weakness and fever.  Pharmacy consulted to dose levofloxacin.  PMH: CAD, HTN, hyperlipidemia, COPD  Goal of Therapy:  Renal adjustment of antibiotics.  Plan:  1. Levofloxacin 750 mg IV q48h 2. Follow up SCr, UOP, cultures, clinical course and adjust as clinically indicated.   Rupal Childress Christine Virginia Crews 04/01/2013,6:51 PM

## 2013-04-01 NOTE — ED Notes (Signed)
Per EMS pt from home with c/o generalized weakness. Pt states the weakness began 2 months ago, son states it began today. CBG 131. VSS. Stroke scale negative. No nausea/vomiting/pain.

## 2013-04-01 NOTE — Assessment & Plan Note (Signed)
I again counseled him and his son about dietary modifications. The main thing is he just simply is not able to exercise.

## 2013-04-01 NOTE — H&P (Signed)
[facility] History and Physical  Richard Chavez WUJ:[id number] DOB: 09/17/1926 DOA: 04/01/2013  Referring physician: EDP PCP: Thayer Headings, MD  Specialists: DR Herbie Baltimore  Chief Complaint: sob and generalized weakness today  HPI: Richard Chavez is a 79 y.o. male with h/o chronic systolic heart failure, multiple admissions for pneumonia, CVA, CAD s/p CABG, hypertension, was brought in by his son to ED for increased generalized weakness . On arrival he underwent a CXR showed increased infiltrates, fever of 100.3 and leukocytosis, he is referred to medical service for admission for management of pneumonia. He denies chest pain , reports increased pedal edema. He was seen by Dr Herbie Baltimore on Monday and his lasix was increased from 20 bid to 40 mg BID. He was also found to have acute renal failure. His troponin was negative. His 12 lead EKG shows multiple PVC'S with sinus brady.   Review of Systems: The patient denies anorexia, fever, weight loss,, vision loss, decreased hearing, hoarseness, chest pain, syncope,  peripheral edema, balance deficits, hemoptysis, abdominal pain, melena, hematochezia, severe indigestion/heartburn, hematuria, incontinence, genital sores, muscle weakness, suspicious skin lesions, transient blindness, difficulty walking, depression, unusual weight change, abnormal bleeding, enlarged lymph nodes, angioedema, and breast masses.    Past Medical History  Diagnosis Date  . CAD (coronary artery disease), native coronary artery     Status post CABG  . Hypertension   . Hyperlipidemia   . GERD (gastroesophageal reflux disease)   . COPD (chronic obstructive pulmonary disease)   . CRF (chronic renal failure)   . Barrett's esophagus   . OP (osteoporosis)   . CAD (coronary artery disease) of artery bypass graft 07/04/2011    LIMA-LAD; SVG-OM2-OM3, SVG-RCA -- all patent as of 05/27/11  . Syncope 07/04/2011  . Pacemaker 07/04/2011  . Cardiomyopathy, ischemic 07/2011; 09/2012    2D  Echo - EF 35-40, mod dilated LA; Relook Echo 09/2012: EF 40-45%  . Myocardial infarction     25 yrs ago  . Peripheral vascular disease   . Arthritis   . BPH (benign prostatic hyperplasia)   . Polyp of colon     removed  . Sleep apnea     STOP BANG SCORE 4  . AV block, 3rd degree     Post St. Jude pacemaker, EF 35-40, does have intermittent atrial tachycardia, either PAt or Afib  . BPH (benign prostatic hypertrophy) 11/15/2011  . Paroxysmal atrial fibrillation - now rate controlled (formally with [facility]) 10/03/2012  . CVA (cerebral infarction) 10/09/2012    Noted to be in atrial fibrillation with evaluation of pacemaker, prior to CVA. Right-sided MCA stroke with left arm weakness/mild paralysis -- notably improved    Past Surgical History  Procedure Laterality Date  . Pacemaker placement  2007  . Cholecystectomy  2005  . Coronary artery bypass graft  1978    X3  . Lesion excision      from penis  . Cardiac catheterization  06/06/2011    occluded proximal RCA, ostial LAD and mid Circumflex after OM 1.; [place] LIMA-LAD, patent SVG-OM1-OM 2, patent SVG-RCA. Reduced ejection fraction of 30-35%.  . Cardiac catheterization  06/14/2010    LIMA to LAD, SVG to RCA, SVG to OM1 Circumflex, 100% occluded RCA, 100% occluded circumfkex, 100% occluded LAD, no evidence of graft dysfunction, continue medical therapy  . Cardiac catheterization  04/12/2003    3 vessel coronary artery disease, continue medical treatment  . Cardiac catheterization  10/07/2000    Severe 3 vessel coronary artery disease,  continue medical treatment  . Doppler echocardiography  10/02/2012    EF 40-45%; possible grade 1 diastolic dysfunction, but A. fib precludes this. Mild atrial dilatation bilaterally. Moderately elevated pulmonary pressures of 40 mmHg.   Social History:  reports that he quit smoking about 27 years ago. His smoking use included Cigarettes. He has a 4.5 pack-year smoking history. He has never used smokeless  tobacco. He reports that he does not drink alcohol or use illicit drugs.  where does patient live--home  Can patient participate in ADLs yes  No Known Allergies  Family History  Problem Relation Age of Onset  . Leukemia Father   . Heart disease Father   . Alzheimer's disease Mother   . Arthritis Mother   . Lung cancer Son 60  . Alzheimer's disease Maternal Grandmother     Prior to Admission medications   Medication Sig Start Date End Date Taking? Authorizing Provider  albuterol-ipratropium (COMBIVENT) 18-103 MCG/ACT inhaler Inhale 2 puffs into the lungs 2 (two) times daily.    Yes Historical Provider, MD  apixaban (ELIQUIS) 5 MG TABS tablet Take 1 tablet (5 mg total) by mouth 2 (two) times daily. 10/29/12  Yes Marykay Lex, MD  atorvastatin (LIPITOR) 20 MG tablet Take 20 mg by mouth daily with breakfast.    Yes Historical Provider, MD  docusate sodium 100 MG CAPS Take 100 mg by mouth daily. 10/17/12  Yes Evlyn Kanner Love, PA-C  fluticasone (FLONASE) 50 MCG/ACT nasal spray Place 1 spray into both nostrils 2 (two) times daily as needed for allergies or rhinitis.   Yes Historical Provider, MD  Fluticasone-Salmeterol (ADVAIR DISKUS) 500-50 MCG/DOSE AEPB Inhale 1 puff into the lungs every 12 (twelve) hours.    Yes Historical Provider, MD  furosemide (LASIX) 20 MG tablet Take 40 mg by mouth 2 (two) times daily.   Yes Historical Provider, MD  gabapentin (NEURONTIN) 300 MG capsule Take 600 mg by mouth 3 (three) times daily.  02/07/11  Yes Historical Provider, MD  guaiFENesin (MUCINEX) 600 MG 12 hr tablet Take 1,200 mg by mouth 2 (two) times daily.   Yes Historical Provider, MD  loratadine (CLARITIN) 10 MG tablet Take 10 mg by mouth daily with breakfast.    Yes Historical Provider, MD  Magnesium Chloride (SLOW-MAG) 535 (64 MG) MG TBCR Take 1 tablet by mouth 2 (two) times daily.    Yes Historical Provider, MD  metoprolol (LOPRESSOR) 50 MG tablet Take 50 mg by mouth 2 (two) times daily.   Yes  Historical Provider, MD  Omega-3 Fatty Acids (FISH OIL) 1000 MG CAPS Take 1 capsule by mouth daily.    Yes Historical Provider, MD  potassium chloride (K-DUR,KLOR-CON) 10 MEQ tablet Take 10 mEq by mouth 2 (two) times daily.   Yes Historical Provider, MD  predniSONE (DELTASONE) 5 MG tablet Take 5 mg by mouth daily.   Yes Historical Provider, MD  ranitidine (ZANTAC) 150 MG capsule Take 150 mg by mouth 2 (two) times daily.    Yes Historical Provider, MD  tiotropium (SPIRIVA) 18 MCG inhalation capsule Place 18 mcg into inhaler and inhale daily with breakfast.    Yes Historical Provider, MD  Vitamin D, Ergocalciferol, (DRISDOL) 50000 UNITS CAPS Take 50,000 Units by mouth every 7 (seven) days. Take on Fridays   Yes Historical Provider, MD  vitamin E 1000 UNIT capsule Take 1,000 Units by mouth daily with breakfast.    Yes Historical Provider, MD   Physical Exam: Filed Vitals:   04/01/13 1745  BP: 105/54  Pulse: 76  Temp:   Resp: 18    Constitutional: Vital signs reviewed.  Patient is a well-developed and well-nourished  in no acute distress and cooperative with exam. Alert and oriented x3.  Head: Normocephalic and atraumatic Mouth: no erythema or exudates, MMM Eyes: PERRL, EOMI, conjunctivae normal, No scleral icterus.  Neck: Supple, Trachea midline normal ROM, No JVD, mass, thyromegaly, or carotid bruit present.  Cardiovascular: RRR, S1 normal, S2 normal, no MRG, pulses symmetric and intact bilaterally Pulmonary/Chest: scattered wheezes and rhonchi at bases.  Abdominal: Soft. Non-tender, non-distended, bowel sounds are normal, no masses, organomegaly, or guarding present.  Musculoskeletal: No joint deformities, erythema, or stiffness, ROM full and no nontender. 3+ pedal edema.  Neurological: A&O x3, Strength is normal and symmetric bilaterally,speech normal.  Skin: Warm, dry and intact. No rash, cyanosis, or clubbing.  Psychiatric: Normal mood and affect.     Labs on Admission:  Basic  Metabolic Panel:  Recent Labs Lab 03/30/13 1211 04/01/13 1524  NA 138 136  K 4.1 3.7  CL 99 98  CO2 27 25  GLUCOSE 85 105*  BUN 23 24*  CREATININE 1.73* 1.85*  CALCIUM 9.3 8.7  MG 2.2  --    Liver Function Tests:  Recent Labs Lab 03/30/13 1211 04/01/13 1524  AST 16 15  ALT 11 12  ALKPHOS 50 51  BILITOT 1.0 1.7*  PROT 6.8 6.3  ALBUMIN 4.0 3.2*   No results found for this basename: LIPASE, AMYLASE,  in the last 168 hours No results found for this basename: AMMONIA,  in the last 168 hours CBC:  Recent Labs Lab 04/01/13 1524  WBC 16.6*  NEUTROABS 13.7*  HGB 13.8  HCT 42.6  MCV 89.5  PLT 158   Cardiac Enzymes: No results found for this basename: CKTOTAL, CKMB, CKMBINDEX, TROPONINI,  in the last 168 hours  BNP (last 3 results)  Recent Labs  09/15/12 1130 10/01/12 0849 10/03/12 0625  PROBNP 1760.0* 1059.0* 993.5*   CBG:  Recent Labs Lab 04/01/13 1426  GLUCAP 97    Radiological Exams on Admission: Dg Chest 2 View  04/01/2013   CLINICAL DATA:  Weakness, leg swelling.  EXAM: CHEST  2 VIEW  COMPARISON:  10/01/2012  FINDINGS: Right-sided dual lead pacemaker is unchanged in appearance. Sternotomy wires and mediastinal surgical clips are again seen. The cardiac silhouette remains enlarged. Lung volumes are mildly improved compared to the prior exam, although there is persistent elevation of the right hemidiaphragm and evidence of right lung volume loss. Left mid and lower lung opacities have improved, with streaky residual opacity remaining in the left lung base. There is an area of more confluent opacity in the right midlung which has increased from the prior exam. Right lower lung airspace opacities have improved. No pleural effusion is identified. No acute osseous abnormality is identified.  IMPRESSION: Increased airspace opacity in the right mid lung with interval improvement of right lower lung and left lung opacities that were present on the prior study.  Findings are concerning for pneumonia.   Electronically Signed   By: Sebastian Ache   On: 04/01/2013 16:17     Assessment/Plan Active Problems:   CAP (community acquired pneumonia)  1. CAP: - Admit to telemetry - start on levaquin.  - blood cultures and sputum cultures ordered.  - nasal oxygen as needed to keep sats >90%.  - bronchodilators as needed.  - SLP eval in view of the multiple admissions for CAP.   2.  Cad s/p cabg: denies any chest pain. Troponin negative.  Pro bnp ordered. Resume home medications.   3. Chronic systolic heart failure;  - recently increased lasix to 40 mg bid.  - continue with the above dose, with potassium supplementation.  - pt reports slight improvement in the pedal edema after increasing the lasix dose.   4. Acute renal failure:  - probably from increasing the lasix to 40 mg BID.  - will evaluate for infection and obstruction.  - get UA - GET renal ultrasound.  - urine electrolytes.   5. Fever, leukocytosis, : possibly from pneumonia.   6. DVT prophylaxis.   Code Status: full code Family Communication: son at bedside, discussed the plan of care with pt and his son Disposition Plan: admit to inpatient.   Time spent: 75 min  Emanie Behan [facility] Pager [phone]  If 7PM-7AM, please contact night-coverage [email or web address] Password [facility] 04/01/2013, 7:09 PM

## 2013-04-01 NOTE — ED Notes (Signed)
Pt reports generalized weakness worse today. States hasn't eaten today, just lack of energy. Denies pain, nausea/vomiting, fever. Hx of stroke this past year with residual left sided weakness. A&O x 4.

## 2013-04-01 NOTE — ED Provider Notes (Signed)
CSN: [id number]     Arrival date & time 04/01/13  1419 History   First MD Initiated Contact with Patient 04/01/13 1505     Chief Complaint  Patient presents with  . Weakness   (Consider location/radiation/quality/duration/timing/severity/associated sxs/prior Treatment) HPI 79 year old man with a history of COPD he presents today with increased weakness. He states he was in his usual state of health last night but has been generally weak since getting up morning. He feels that he likely has a fever. He denies any headache, neck pain, chest pain, worsened dyspnea, abdominal pain, nausea, vomiting, or diarrhea. He states he does have some cough productive of sputum but this is his baseline. He states that he feels generally weak and has not noted any lateralized weakness. He has been weak to the point that is very difficult for him to walk and his son does not feel that he is safe to ambulate. He says that when he stands up he feels like he is going to pass out. Primary care is Dr. Thea Silversmith. Past Medical History  Diagnosis Date  . CAD (coronary artery disease), native coronary artery     Status post CABG  . Hypertension   . Hyperlipidemia   . GERD (gastroesophageal reflux disease)   . COPD (chronic obstructive pulmonary disease)   . CRF (chronic renal failure)   . Barrett's esophagus   . OP (osteoporosis)   . CAD (coronary artery disease) of artery bypass graft 07/04/2011    LIMA-LAD; SVG-OM2-OM3, SVG-RCA -- all patent as of 05/27/11  . Syncope 07/04/2011  . Pacemaker 07/04/2011  . Cardiomyopathy, ischemic 07/2011; 09/2012    2D Echo - EF 35-40, mod dilated LA; Relook Echo 09/2012: EF 40-45%  . Myocardial infarction     25 yrs ago  . Peripheral vascular disease   . Arthritis   . BPH (benign prostatic hyperplasia)   . Polyp of colon     removed  . Sleep apnea     STOP BANG SCORE 4  . AV block, 3rd degree     Post [employer] pacemaker, EF 35-40, does have intermittent atrial tachycardia,  either PAt or Afib  . BPH (benign prostatic hypertrophy) 11/15/2011  . Paroxysmal atrial fibrillation - now rate controlled (formally with [facility] and Hospital) 10/03/2012  . CVA (cerebral infarction) 10/09/2012    Noted to be in atrial fibrillation with evaluation of pacemaker, prior to CVA. Right-sided MCA stroke with left arm weakness/mild paralysis -- notably improved    Past Surgical History  Procedure Laterality Date  . Pacemaker placement  2007  . Cholecystectomy  2005  . Coronary artery bypass graft  1978    X3  . Lesion excision      from penis  . Cardiac catheterization  06/06/2011    occluded proximal RCA, ostial LAD and mid Circumflex after OM 1.; [place] LIMA-LAD, patent SVG-OM1-OM 2, patent SVG-RCA. Reduced ejection fraction of 30-35%.  . Cardiac catheterization  06/14/2010    LIMA to LAD, SVG to RCA, SVG to OM1 Circumflex, 100% occluded RCA, 100% occluded circumfkex, 100% occluded LAD, no evidence of graft dysfunction, continue medical therapy  . Cardiac catheterization  04/12/2003    3 vessel coronary artery disease, continue medical treatment  . Cardiac catheterization  10/07/2000    Severe 3 vessel coronary artery disease, continue medical treatment  . Doppler echocardiography  10/02/2012    EF 40-45%; possible grade 1 diastolic dysfunction, but A. fib precludes this. Mild atrial dilatation bilaterally. Moderately elevated pulmonary  pressures of 40 mmHg.   Family History  Problem Relation Age of Onset  . Leukemia Father   . Heart disease Father   . Alzheimer's disease Mother   . Arthritis Mother   . Lung cancer Son 75  . Alzheimer's disease Maternal Grandmother    History  Substance Use Topics  . Smoking status: Former Smoker -- 0.30 packs/day for 15 years    Types: Cigarettes    Quit date: 05/07/1985  . Smokeless tobacco: Never Used  . Alcohol Use: No    Review of Systems  All other systems reviewed and are negative.    Allergies  Review of patient's allergies  indicates no known allergies.  Home Medications   Current Outpatient Rx  Name  Route  Sig  Dispense  Refill  . albuterol-ipratropium (COMBIVENT) 18-103 MCG/ACT inhaler   Inhalation   Inhale 2 puffs into the lungs 2 (two) times daily.          Marland Kitchen apixaban (ELIQUIS) 5 MG TABS tablet   Oral   Take 1 tablet (5 mg total) by mouth 2 (two) times daily.   180 tablet   3   . atorvastatin (LIPITOR) 20 MG tablet   Oral   Take 20 mg by mouth daily with breakfast.          . docusate sodium 100 MG CAPS   Oral   Take 100 mg by mouth daily.   10 capsule   0   . fluticasone (FLONASE) 50 MCG/ACT nasal spray   Each Nare   Place 1 spray into both nostrils 2 (two) times daily as needed for allergies or rhinitis.         . Fluticasone-Salmeterol (ADVAIR DISKUS) 500-50 MCG/DOSE AEPB   Inhalation   Inhale 1 puff into the lungs every 12 (twelve) hours.          . furosemide (LASIX) 20 MG tablet   Oral   Take 40 mg by mouth 2 (two) times daily.         Marland Kitchen gabapentin (NEURONTIN) 300 MG capsule   Oral   Take 600 mg by mouth 3 (three) times daily.          Marland Kitchen guaiFENesin (MUCINEX) 600 MG 12 hr tablet   Oral   Take 1,200 mg by mouth 2 (two) times daily.         Marland Kitchen loratadine (CLARITIN) 10 MG tablet   Oral   Take 10 mg by mouth daily with breakfast.          . Magnesium Chloride (SLOW-MAG) 535 (64 MG) MG TBCR   Oral   Take 1 tablet by mouth 2 (two) times daily.          . metoprolol (LOPRESSOR) 50 MG tablet   Oral   Take 50 mg by mouth 2 (two) times daily.         . Omega-3 Fatty Acids (FISH OIL) 1000 MG CAPS   Oral   Take 1 capsule by mouth daily.          . potassium chloride (K-DUR,KLOR-CON) 10 MEQ tablet   Oral   Take 10 mEq by mouth 2 (two) times daily.         . predniSONE (DELTASONE) 5 MG tablet   Oral   Take 5 mg by mouth daily.         . ranitidine (ZANTAC) 150 MG capsule   Oral   Take 150 mg by mouth 2 (two) times daily.          Marland Kitchen  tiotropium (SPIRIVA) 18 MCG inhalation capsule   Inhalation   Place 18 mcg into inhaler and inhale daily with breakfast.          . Vitamin D, Ergocalciferol, (DRISDOL) 50000 UNITS CAPS   Oral   Take 50,000 Units by mouth every 7 (seven) days. Take on Fridays         . vitamin E 1000 UNIT capsule   Oral   Take 1,000 Units by mouth daily with breakfast.           BP 122/49  Pulse 47  Temp(Src) 100.3 F (37.9 C) (Rectal)  Resp 25  SpO2 94% Physical Exam  Nursing note and vitals reviewed. Constitutional: He is oriented to person, place, and time. He appears well-developed and well-nourished.  HENT:  Head: Normocephalic and atraumatic.  Mucous membranes appear dry  Eyes: Conjunctivae and EOM are normal. Pupils are equal, round, and reactive to light.  Neck: Normal range of motion. Neck supple.  Cardiovascular: Normal rate, regular rhythm, normal heart sounds and intact distal pulses.   Pulmonary/Chest: Effort normal and breath sounds normal.  Abdominal: Soft. Bowel sounds are normal.  Musculoskeletal: Normal range of motion.  Neurological: He is alert and oriented to person, place, and time. He has normal reflexes.  Skin: Skin is warm and dry.  Psychiatric: He has a normal mood and affect. His behavior is normal. Judgment and thought content normal.    ED Course  Procedures (including critical care time) Labs Review Labs Reviewed  CBC WITH DIFFERENTIAL - Abnormal; Notable for the following:    WBC 16.6 (*)    RDW 16.3 (*)    Neutrophils Relative % 83 (*)    Neutro Abs 13.7 (*)    Lymphocytes Relative 8 (*)    Monocytes Absolute 1.6 (*)    All other components within normal limits  COMPREHENSIVE METABOLIC PANEL - Abnormal; Notable for the following:    Glucose, Bld 105 (*)    BUN 24 (*)    Creatinine, Ser 1.85 (*)    Albumin 3.2 (*)    Total Bilirubin 1.7 (*)    GFR calc non Af Amer 32 (*)    GFR calc Af Amer 37 (*)    All other components within normal limits   GLUCOSE, CAPILLARY  URINALYSIS, ROUTINE W REFLEX MICROSCOPIC  POCT I-STAT TROPONIN I   Imaging Review Dg Chest 2 View  04/01/2013   CLINICAL DATA:  Weakness, leg swelling.  EXAM: CHEST  2 VIEW  COMPARISON:  10/01/2012  FINDINGS: Right-sided dual lead pacemaker is unchanged in appearance. Sternotomy wires and mediastinal surgical clips are again seen. The cardiac silhouette remains enlarged. Lung volumes are mildly improved compared to the prior exam, although there is persistent elevation of the right hemidiaphragm and evidence of right lung volume loss. Left mid and lower lung opacities have improved, with streaky residual opacity remaining in the left lung base. There is an area of more confluent opacity in the right midlung which has increased from the prior exam. Right lower lung airspace opacities have improved. No pleural effusion is identified. No acute osseous abnormality is identified.  IMPRESSION: Increased airspace opacity in the right mid lung with interval improvement of right lower lung and left lung opacities that were present on the prior study. Findings are concerning for pneumonia.   Electronically Signed   By: Sebastian Ache   On: 04/01/2013 16:17  I have reviewed the report and personally reviewed the above radiology studies.  EKG Interpretation    Date/Time:  Wednesday April 01 2013 14:23:18 EST Ventricular Rate:  80 PR Interval:  146 QRS Duration: 114 QT Interval:  398 QTC Calculation: 459 R Axis:   -10 Text Interpretation:  Sinus or ectopic atrial rhythm Ventricular trigeminy Inferior infarct, old Consider anterior infarct Lateral leads are also involved Confirmed by Daurice Ovando MD, Haylin Camilli ([id number]) on 04/01/2013 5:17:34 PM           Results for orders placed during the hospital encounter of 04/01/13  GLUCOSE, CAPILLARY      Result Value Range   Glucose-Capillary 97  70 - 99 mg/dL  CBC WITH DIFFERENTIAL      Result Value Range   WBC 16.6 (*) 4.0 - 10.5 K/uL    RBC 4.76  4.22 - 5.81 MIL/uL   Hemoglobin 13.8  13.0 - 17.0 g/dL   HCT 16.1  09.6 - 04.5 %   MCV 89.5  78.0 - 100.0 fL   MCH 29.0  26.0 - 34.0 pg   MCHC 32.4  30.0 - 36.0 g/dL   RDW 40.9 (*) 81.1 - 91.4 %   Platelets 158  150 - 400 K/uL   Neutrophils Relative % 83 (*) 43 - 77 %   Neutro Abs 13.7 (*) 1.7 - 7.7 K/uL   Lymphocytes Relative 8 (*) 12 - 46 %   Lymphs Abs 1.3  0.7 - 4.0 K/uL   Monocytes Relative 9  3 - 12 %   Monocytes Absolute 1.6 (*) 0.1 - 1.0 K/uL   Eosinophils Relative 0  0 - 5 %   Eosinophils Absolute 0.0  0.0 - 0.7 K/uL   Basophils Relative 0  0 - 1 %   Basophils Absolute 0.0  0.0 - 0.1 K/uL  COMPREHENSIVE METABOLIC PANEL      Result Value Range   Sodium 136  135 - 145 mEq/L   Potassium 3.7  3.5 - 5.1 mEq/L   Chloride 98  96 - 112 mEq/L   CO2 25  19 - 32 mEq/L   Glucose, Bld 105 (*) 70 - 99 mg/dL   BUN 24 (*) 6 - 23 mg/dL   Creatinine, Ser 7.82 (*) 0.50 - 1.35 mg/dL   Calcium 8.7  8.4 - 95.6 mg/dL   Total Protein 6.3  6.0 - 8.3 g/dL   Albumin 3.2 (*) 3.5 - 5.2 g/dL   AST 15  0 - 37 U/L   ALT 12  0 - 53 U/L   Alkaline Phosphatase 51  39 - 117 U/L   Total Bilirubin 1.7 (*) 0.3 - 1.2 mg/dL   GFR calc non Af Amer 32 (*) >90 mL/min   GFR calc Af Amer 37 (*) >90 mL/min  POCT I-STAT TROPONIN I      Result Value Range   Troponin i, poc 0.00  0.00 - 0.08 ng/mL   Comment 3             MDM  No diagnosis found.  79 year old male with multiple medical problems that include coronary artery disease and COPD who presents today with generalized weakness and fever. Workup revealed probable right middle lobe infiltrate. He is treated for community-acquired pneumonia with Levaquin as he has a history of COPD. He is not appear to be in any respiratory distress. His blood pressure has been systolically 98-122 which appears to be adequate but, given his history of hypertension, is likely somewhat low for him. He is receiving an IV fluid bolus as he has not  been able to urinate  at this time. Given his weakness, pneumonia, and multiple comorbidities, he will require inpatient therapy. Hospitalists have been paged for admission.   Patient's care discussed with Dr.  Blake Divine  and I have placed temporary admitting orders to a telemetry bed to  Hilario Quarry, MD 04/01/13 1727

## 2013-04-01 NOTE — Assessment & Plan Note (Signed)
Is definitely present, and the findings on his pacemaker would suggest that he has increased filling pressures. He however is not overly symptomatic as far as any PND orthopnea goes. This is an improvement from last week.  Lasix dosing as noted above. Monitor renal function closely.

## 2013-04-01 NOTE — Assessment & Plan Note (Signed)
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  I think we might be noted still present, we still have a ways to go. I've asked that Richard Chavez continue the current dosing through the end of this week. Then return to 40 mg once a day or 20 twice a day when the weight gets back to her baseline which is 238-240 pounds. Richard Chavez currently weighs 242 pounds at home (which is consistent with 247 pounds here). We then talked about sliding scale Lasix.  Richard Chavez'll continue to wear his compression stockings as there does not seem to be any procedural technique for improving his edema.

## 2013-04-01 NOTE — ED Notes (Addendum)
This RN attempted lab draw x2 unsuccessful. Phlebotomy notified

## 2013-04-01 NOTE — Progress Notes (Signed)
Attempted to call ED for report. Spoke to RN who stated she would have to call me back. Time was 1816.

## 2013-04-02 DIAGNOSIS — I25810 Atherosclerosis of coronary artery bypass graft(s) without angina pectoris: Secondary | ICD-10-CM

## 2013-04-02 DIAGNOSIS — I10 Essential (primary) hypertension: Secondary | ICD-10-CM

## 2013-04-02 LAB — URINALYSIS, ROUTINE W REFLEX MICROSCOPIC
Bilirubin Urine: NEGATIVE
Ketones, ur: NEGATIVE mg/dL
Leukocytes, UA: NEGATIVE
Nitrite: NEGATIVE
Specific Gravity, Urine: 1.02 (ref 1.005–1.030)
Urobilinogen, UA: 0.2 mg/dL (ref 0.0–1.0)
pH: 6 (ref 5.0–8.0)

## 2013-04-02 LAB — SODIUM, URINE, RANDOM: Sodium, Ur: 25 mEq/L

## 2013-04-02 LAB — CREATININE, URINE, RANDOM: Creatinine, Urine: 210.07 mg/dL

## 2013-04-02 MED ORDER — CHLORHEXIDINE GLUCONATE CLOTH 2 % EX PADS
6.0000 | MEDICATED_PAD | Freq: Every day | CUTANEOUS | Status: DC
  Administered 2013-04-02 – 2013-04-03 (×2): 6 via TOPICAL

## 2013-04-02 MED ORDER — FUROSEMIDE 20 MG PO TABS
20.0000 mg | ORAL_TABLET | Freq: Two times a day (BID) | ORAL | Status: DC
  Administered 2013-04-02 – 2013-04-03 (×2): 20 mg via ORAL
  Filled 2013-04-02 (×4): qty 1

## 2013-04-02 MED ORDER — MUPIROCIN 2 % EX OINT
1.0000 "application " | TOPICAL_OINTMENT | Freq: Two times a day (BID) | CUTANEOUS | Status: DC
  Administered 2013-04-02 – 2013-04-03 (×4): 1 via NASAL
  Filled 2013-04-02: qty 22

## 2013-04-02 NOTE — Progress Notes (Signed)
Abnormal Lab Results: (late entry)  Collected: 04/01/13 2041 Resulting lab: [facility]  Reference range: NEGATIVE Value: POSITIVE (Abnormal) Comment: RESULT CALLED TO, READ BACK BY AND VERIFIED WITH: CALLED TO RN Wayland Salinas HILL [id number] @2340   Lab Tech called to inform RN that MRSA swap was positive. MRSA order set was initiated and implemented. On Call MD Craige Cotta was notified of new finding. No new orders were placed after Order Set protocol was set. Will continue to monitor the patient.  [employer], [place]  [id number]

## 2013-04-02 NOTE — Progress Notes (Signed)
[facility] PROGRESS NOTE  Richard Chavez WUJ:[id number] DOB: [birth year] DOA: 04/01/2013 PCP: Richard Headings, MD  Assessment/Plan: CAP:  - levaquin.  - blood cultures and sputum cultures ordered.  - nasal oxygen as needed to keep sats >90%.  - bronchodilators as needed.  - SLP eval in view of the multiple admissions for CAP.   Cad s/p cabg: denies any chest pain. Troponin negative.  Resume home medications- lasix at a lower dose due to AKI  Chronic systolic heart failure;  recently increased lasix to 40 mg bid.   - will need close follow up with cardiology as an outpatient   Acute renal failure:  - probably from increasing the lasix to 40 mg BID.  - u/a and renal U/s ok   Fever, leukocytosis, : possibly from pneumonia   Code Status: full Family Communication: patient Disposition Plan: 1-2 days; PT eval   Consultants:    Procedures:    Antibiotics:  levaquin 11/27  HPI/Subjective: patient feeling better No SOB   Objective: Filed Vitals:   04/02/13 0459  BP: 108/61  Pulse: 71  Temp: 98 F (36.7 C)  Resp: 17    Intake/Output Summary (Last 24 hours) at 04/02/13 0909 Last data filed at 04/02/13 0500  Gross per 24 hour  Intake     50 ml  Output    600 ml  Net   -550 ml   Filed Weights   04/01/13 1745 04/01/13 1952  Weight: 112.129 kg (247 lb 3.2 oz) 111.403 kg (245 lb 9.6 oz)    Exam:   General:  A+Ox3, NAD  Cardiovascular: rrr  Respiratory: clear, no wheezing  Abdomen: +BS, soft  Musculoskeletal: moves all 4 ext, min LE edema   Data Reviewed: Basic Metabolic Panel:  Recent Labs Lab 03/30/13 1211 04/01/13 1524  NA 138 136  K 4.1 3.7  CL 99 98  CO2 27 25  GLUCOSE 85 105*  BUN 23 24*  CREATININE 1.73* 1.85*  CALCIUM 9.3 8.7  MG 2.2  --    Liver Function Tests:  Recent Labs Lab 03/30/13 1211 04/01/13 1524  AST 16 15  ALT 11 12  ALKPHOS 50 51  BILITOT 1.0 1.7*  PROT 6.8 6.3  ALBUMIN 4.0 3.2*   No results  found for this basename: LIPASE, AMYLASE,  in the last 168 hours No results found for this basename: AMMONIA,  in the last 168 hours CBC:  Recent Labs Lab 04/01/13 1524  WBC 16.6*  NEUTROABS 13.7*  HGB 13.8  HCT 42.6  MCV 89.5  PLT 158   Cardiac Enzymes: No results found for this basename: CKTOTAL, CKMB, CKMBINDEX, TROPONINI,  in the last 168 hours BNP (last 3 results)  Recent Labs  10/01/12 0849 10/03/12 0625 04/01/13 2115  PROBNP 1059.0* 993.5* 1693.0*   CBG:  Recent Labs Lab 04/01/13 1426  GLUCAP 97    Recent Results (from the past 240 hour(s))  MRSA PCR SCREENING     Status: Abnormal   Collection Time    04/01/13  8:41 PM      Result Value Range Status   MRSA by PCR POSITIVE (*) NEGATIVE Final   Comment:            The GeneXpert MRSA Assay (FDA     approved for NASAL specimens     only), is one component of a     comprehensive MRSA colonization     surveillance program. It is not     intended to diagnose MRSA  infection nor to guide or     monitor treatment for     MRSA infections.     RESULT CALLED TO, READ BACK BY AND VERIFIED WITH:     CALLED TO RN Wayland Salinas HILL [id number] @2340      Studies: Dg Chest 2 View  04/01/2013   CLINICAL DATA:  Weakness, leg swelling.  EXAM: CHEST  2 VIEW  COMPARISON:  10/01/2012  FINDINGS: Right-sided dual lead pacemaker is unchanged in appearance. Sternotomy wires and mediastinal surgical clips are again seen. The cardiac silhouette remains enlarged. Lung volumes are mildly improved compared to the prior exam, although there is persistent elevation of the right hemidiaphragm and evidence of right lung volume loss. Left mid and lower lung opacities have improved, with streaky residual opacity remaining in the left lung base. There is an area of more confluent opacity in the right midlung which has increased from the prior exam. Right lower lung airspace opacities have improved. No pleural effusion is identified. No acute osseous  abnormality is identified.  IMPRESSION: Increased airspace opacity in the right mid lung with interval improvement of right lower lung and left lung opacities that were present on the prior study. Findings are concerning for pneumonia.   Electronically Signed   By: Sebastian Ache   On: 04/01/2013 16:17   US Renal  04/01/2013   CLINICAL DATA:  Elevated creatinine.  Previous cholecystectomy.  EXAM: RENAL/URINARY TRACT ULTRASOUND COMPLETE  COMPARISON:  CT abdomen and pelvis 10/03/2012  FINDINGS: Right Kidney:  Length: 11.2 cm. Diffuse parenchymal atrophy with increased echotexture suggesting chronic medical renal disease. No hydronephrosis or solid mass demonstrated.  Left Kidney:  Length: 12.4 cm. Diffuse increased parenchymal echotexture consistent with medical renal disease. No hydronephrosis. 3 small exophytic cyst are demonstrated, 2 in the mid pole and 1 in the lower pole. The cysts measure from about 1.9 to 2 cm maximal dimension. No solid mass demonstrated.  Bladder:  No wall thickening or intraluminal filling defects. Prevoid bladder volume is measured at 271 mm. Postvoid images were not obtained.  IMPRESSION: Echogenic renal parenchymal appearance bilaterally most consistent with medical renal disease. No evidence of hydronephrosis. Benign-appearing cysts in the left kidney.   Electronically Signed   By: Burman Nieves M.D.   On: 04/01/2013 23:33    Scheduled Meds: . apixaban  2.5 mg Oral BID  . atorvastatin  20 mg Oral Q breakfast  . Chlorhexidine Gluconate Cloth  6 each Topical Q0600  . furosemide  20 mg Oral BID  . gabapentin  600 mg Oral TID  . guaiFENesin  1,200 mg Oral BID  . [START ON 04/03/2013] levofloxacin (LEVAQUIN) IV  750 mg Intravenous Q48H  . loratadine  10 mg Oral Q breakfast  . mometasone-formoterol  2 puff Inhalation BID  . mupirocin ointment  1 application Nasal BID  . potassium chloride  10 mEq Oral BID   Continuous Infusions:   Active Problems:   Hypertension    COPD (chronic obstructive pulmonary disease)   CAD CABG 1998, patent grafts 06/06/11 after abn Myoview   Chronic combined systolic and diastolic congestive heart failure   CAP (community acquired pneumonia)    Time spent: 35 min    Richard Chavez  [facility] Pager [phone]. If 7PM-7AM, please contact night-coverage at [email or web address], password [facility] 04/02/2013, 9:09 AM  LOS: 1 day

## 2013-04-03 LAB — CBC
Hemoglobin: 12.6 g/dL — ABNORMAL LOW (ref 13.0–17.0)
MCH: 30.4 pg (ref 26.0–34.0)
MCV: 89.4 fL (ref 78.0–100.0)
Platelets: 145 10*3/uL — ABNORMAL LOW (ref 150–400)
RBC: 4.15 MIL/uL — ABNORMAL LOW (ref 4.22–5.81)
RDW: 16.4 % — ABNORMAL HIGH (ref 11.5–15.5)
WBC: 7.5 10*3/uL (ref 4.0–10.5)

## 2013-04-03 LAB — LEGIONELLA ANTIGEN, URINE: Legionella Antigen, Urine: NEGATIVE

## 2013-04-03 LAB — BASIC METABOLIC PANEL
CO2: 25 mEq/L (ref 19–32)
Calcium: 8 mg/dL — ABNORMAL LOW (ref 8.4–10.5)
Chloride: 96 mEq/L (ref 96–112)
Glucose, Bld: 94 mg/dL (ref 70–99)
Potassium: 3.3 mEq/L — ABNORMAL LOW (ref 3.5–5.1)
Sodium: 132 mEq/L — ABNORMAL LOW (ref 135–145)

## 2013-04-03 MED ORDER — FUROSEMIDE 20 MG PO TABS: 20.0000 mg | ORAL_TABLET | Freq: Two times a day (BID) | ORAL | Status: DC

## 2013-04-03 MED ORDER — POTASSIUM CHLORIDE CRYS ER 20 MEQ PO TBCR
40.0000 meq | EXTENDED_RELEASE_TABLET | Freq: Once | ORAL | Status: AC
  Administered 2013-04-03: 40 meq via ORAL
  Filled 2013-04-03: qty 2

## 2013-04-03 MED ORDER — APIXABAN 2.5 MG PO TABS: 2.5000 mg | ORAL_TABLET | Freq: Two times a day (BID) | ORAL | Status: DC

## 2013-04-03 MED ORDER — LEVOFLOXACIN 750 MG PO TABS: 750.0000 mg | ORAL_TABLET | ORAL | Status: DC

## 2013-04-03 MED ORDER — METOPROLOL TARTRATE 25 MG PO TABS: 25.0000 mg | ORAL_TABLET | Freq: Two times a day (BID) | ORAL | Status: DC

## 2013-04-03 NOTE — Evaluation (Signed)
Physical Therapy Evaluation Patient Details Name: Richard Chavez MRN: [id number] DOB: 09-Aug-1926 Today's Date: 04/03/2013 Time: 0752-0810 PT Time Calculation (min): 18 min  PT Assessment / Plan / Recommendation History of Present Illness  Pt is a 79 y.o. male adm secondary to SOB and generalized weakness. Pt being treated for CAP. PMH includes chronic systolic heart failure, multiple adm for PNA, CVA, CAD s/p CABG, HTN.   Clinical Impression  Pt adm secondary to above. Presents with decreased independence with mobility and is a fall risk secondary to deficits listed below (See PT problem list). Pt to benefit from skilled PT in acute setting to maximize functional mobility and address deficits listed below. Pt reports he has 24/7 (A) from son. Anticipates D/C soon.     PT Assessment  Patient needs continued PT services    Follow Up Recommendations  Supervision/Assistance - 24 hour;No PT follow up    Does the patient have the potential to tolerate intense rehabilitation      Barriers to Discharge        Equipment Recommendations  None recommended by PT    Recommendations for Other Services OT consult   Frequency Min 3X/week    Precautions / Restrictions Precautions Precautions: Fall Restrictions Weight Bearing Restrictions: No   Pertinent Vitals/Pain No c/o pain.       Mobility  Bed Mobility Bed Mobility: Supine to Sit;Sitting - Scoot to Edge of Bed Supine to Sit: 6: Modified independent (Device/Increase time);With rails Sitting - Scoot to Edge of Bed: 6: Modified independent (Device/Increase time) Details for Bed Mobility Assistance: pt required increased time and relies on hand rails for bed mobility  Transfers Transfers: Sit to Stand;Stand to Sit Sit to Stand: From bed;5: Supervision;From chair/3-in-1;With armrests Stand to Sit: 5: Supervision;To chair/3-in-1;With armrests Details for Transfer Assistance: cues for hand placement and safety; pt demo decr safety  awareness with transfers Ambulation/Gait Ambulation/Gait Assistance: 4: Min guard Ambulation Distance (Feet): 100 Feet Assistive device: Rolling walker Ambulation/Gait Assistance Details: pt tends to leave RW behind at times; demo decr safety awareness with RW; at one point was pushing RW with single hand;  cues for safe management of RW Gait Pattern: Step-through pattern;Wide base of support;Trunk flexed;Shuffle Gait velocity: decreased  Stairs: Yes Stairs Assistance: 4: Min guard Stairs Assistance Details (indicate cue type and reason): cues for safe technique; min guard to steady; pt slightly unsteady; will require (A) to get into house; reports his son will help Stair Management Technique: One rail Right;Step to pattern;Forwards Number of Stairs: 2 Wheelchair Mobility Wheelchair Mobility: No    Exercises General Exercises - Lower Extremity Ankle Circles/Pumps: AROM;Both;10 reps;Seated   PT Diagnosis: Abnormality of gait  PT Problem List: Decreased activity tolerance;Decreased balance;Decreased mobility;Decreased cognition;Decreased knowledge of use of DME;Decreased safety awareness PT Treatment Interventions: DME instruction;Gait training;Stair training;Functional mobility training;Therapeutic exercise;Therapeutic activities;Balance training;Neuromuscular re-education;Patient/family education     PT Goals(Current goals can be found in the care plan section) Acute Rehab PT Goals Patient Stated Goal: to go home today PT Goal Formulation: With patient Time For Goal Achievement: 04/10/13 Potential to Achieve Goals: Good  Visit Information  Last PT Received On: 04/03/13 Assistance Needed: +1 History of Present Illness: Pt is a 79 y.o. male adm secondary to        Prior Functioning  Home Living Family/patient expects to be discharged to:: Private residence Living Arrangements: Children Available Help at Discharge: Family;Available 24 hours/day Type of Home: House Home  Access: Stairs to enter [employer] of Steps:  2 Entrance Stairs-Rails: Can reach both Home Layout: One level Home Equipment: Grab bars - tub/shower;Walker - 2 wheels;Cane - single point Prior Function Level of Independence: Independent with assistive device(s) Communication Communication: Expressive difficulties Dominant Hand: Left    Cognition  Cognition Arousal/Alertness: Awake/alert Behavior During Therapy: WFL for tasks assessed/performed Overall Cognitive Status: Impaired/Different from baseline Area of Impairment: Orientation;Safety/judgement;Problem solving Orientation Level: Disoriented to;Time Memory: Decreased short-term memory Safety/Judgement: Decreased awareness of safety;Decreased awareness of deficits Problem Solving: Slow processing;Requires verbal cues;Requires tactile cues General Comments: no family present to determine baseline     Extremity/Trunk Assessment Upper Extremity Assessment Upper Extremity Assessment: Defer to OT evaluation Lower Extremity Assessment Lower Extremity Assessment: Overall WFL for tasks assessed Cervical / Trunk Assessment Cervical / Trunk Assessment: Normal   Balance Balance Balance Assessed: Yes Static Standing Balance Static Standing - Balance Support: Bilateral upper extremity supported;During functional activity Static Standing - Level of Assistance: 5: Stand by assistance  End of Session PT - End of Session Equipment Utilized During Treatment: Gait belt Activity Tolerance: Patient tolerated treatment well Patient left: in chair;with call bell/phone within reach Nurse Communication: Mobility status  GP     Donell Sievert,  [phone] 04/03/2013, 8:52 AM

## 2013-04-03 NOTE — Progress Notes (Signed)
Patient discharged home with family. Patient was given prescriptions and instructions. Patient was told to call doctor with questions and concerns. Patient was stable upon discharge.

## 2013-04-03 NOTE — Evaluation (Signed)
Clinical/Bedside Swallow Evaluation Patient Details  Name: Richard Chavez MRN: [id number] Date of Birth: 05/12/1926  Today's Date: 04/03/2013 Time: 4098-1191 SLP Time Calculation (min): 35 min  Past Medical History:  Past Medical History  Diagnosis Date  . CAD (coronary artery disease), native coronary artery     Status post CABG  . Hypertension   . Hyperlipidemia   . GERD (gastroesophageal reflux disease)   . COPD (chronic obstructive pulmonary disease)   . CRF (chronic renal failure)   . Barrett's esophagus   . OP (osteoporosis)   . CAD (coronary artery disease) of artery bypass graft 07/04/2011    LIMA-LAD; SVG-OM2-OM3, SVG-RCA -- all patent as of 05/27/11  . Syncope 07/04/2011  . Pacemaker 07/04/2011  . Cardiomyopathy, ischemic 07/2011; 09/2012    2D Echo - EF 35-40, mod dilated LA; Relook Echo 09/2012: EF 40-45%  . Myocardial infarction     25 yrs ago  . Peripheral vascular disease   . Arthritis   . BPH (benign prostatic hyperplasia)   . Polyp of colon     removed  . Sleep apnea     STOP BANG SCORE 4  . AV block, 3rd degree     Post St. Jude pacemaker, EF 35-40, does have intermittent atrial tachycardia, either PAt or Afib  . BPH (benign prostatic hypertrophy) 11/15/2011  . Paroxysmal atrial fibrillation - now rate controlled (formally with [facility] and Hospital) 10/03/2012  . CVA (cerebral infarction) 10/09/2012    Noted to be in atrial fibrillation with evaluation of pacemaker, prior to CVA. Right-sided MCA stroke with left arm weakness/mild paralysis -- notably improved    Past Surgical History:  Past Surgical History  Procedure Laterality Date  . Pacemaker placement  2007  . Cholecystectomy  2005  . Coronary artery bypass graft  1978    X3  . Lesion excision      from penis  . Cardiac catheterization  06/06/2011    occluded proximal RCA, ostial LAD and mid Circumflex after OM 1.; [place] LIMA-LAD, patent SVG-OM1-OM 2, patent SVG-RCA. Reduced ejection fraction of 30-35%.  .  Cardiac catheterization  06/14/2010    LIMA to LAD, SVG to RCA, SVG to OM1 Circumflex, 100% occluded RCA, 100% occluded circumfkex, 100% occluded LAD, no evidence of graft dysfunction, continue medical therapy  . Cardiac catheterization  04/12/2003    3 vessel coronary artery disease, continue medical treatment  . Cardiac catheterization  10/07/2000    Severe 3 vessel coronary artery disease, continue medical treatment  . Doppler echocardiography  10/02/2012    EF 40-45%; possible grade 1 diastolic dysfunction, but A. fib precludes this. Mild atrial dilatation bilaterally. Moderately elevated pulmonary pressures of 40 mmHg.   HPI:  79 yo male adm to [facility] and diagnosed with CAP, PMH + for ARF, heart failure, Barret's esophagus, GERD, Right MCA CVA 10/2012, COPD, SBO 10/2012.  Pt for swallow evaluation due to recurrent admissions for pna.  Pt denies h/o dysphagia.   Assessment / Plan / Recommendation Clinical Impression  Pr presents with facial, hypoglossal CN deficits likely from right MCA CVA.  He denies dysphagia as he states he is "careful" with eating.  Pt with a cough x1 of approximately 10 boluses of water - due to pt talking at the same time.  Pt without s/s of aspiration/laryngeal penetration with further intake of water and cracker.   Adequate mastication ability and slow rate of intake observed.   Pt does have xerostomia per his report.  GERD on  medical hx, which pt denies significant symptoms but takes Zantac PRN - also has h/o Barrett's per chart.  Pt reports he feels he does not need further testing at this time.   Educated him extensively to aspiration/reflux precautions - using teach back for most important points.  Provided written compensation strategies to mitigate episodic aspiration risk with multiple risk factors (COPD, h/o right MCA CVA, Barrett's/GERD) with SLP contact number.    Outpt MBS and/or esophagram may be conducted if MD indicates in future.  Thanks for this order.      Aspiration Risk  Mild    Diet Recommendation Regular;Thin liquid   Liquid Administration via: Cup;Straw Medication Administration: Whole meds with liquid Supervision: Patient able to self feed Compensations: Slow rate;Small sips/bites (drink liquids t/o meal) Postural Changes and/or Swallow Maneuvers: Seated upright 90 degrees;Upright 30-60 min after meal    Other  Recommendations Oral Care Recommendations: Oral care BID   Follow Up Recommendations  None    Frequency and Duration   n/a     Pertinent Vitals/Pain Afebrile, decreased    SLP Swallow Goals     Swallow Study Prior Functional Status  Type of Home: House Available Help at Discharge: Family;Available 24 hours/day    General Date of Onset: 04/03/13 HPI: 79 yo male adm to [facility] and diagnosed with CAP, PMH + for ARF, heart failure, Barret's esophagus, GERD, Right MCA CVA 10/2012, COPD, SBO 10/2012.  Pt for swallow evaluation due to recurrent admissions for pna.  Pt denies h/o dysphagia. Type of Study: Bedside swallow evaluation Diet Prior to this Study: Regular;Thin liquids Temperature Spikes Noted: No Respiratory Status: Room air History of Recent Intubation: No Behavior/Cognition: Alert;Cooperative;Pleasant mood Oral Cavity - Dentition: Poor condition (has partials, ill fitting and does not wear) Self-Feeding Abilities: Able to feed self Patient Positioning: Upright in bed Baseline Vocal Quality: Clear Volitional Cough: Strong Volitional Swallow: Able to elicit    Oral/Motor/Sensory Function Labial ROM: Reduced left Labial Symmetry: Abnormal symmetry left Lingual ROM: Reduced left Lingual Strength: Reduced (deviation slightly to left upon protrusion) Velum: Within Functional Limits Mandible: Within Functional Limits   Ice Chips Ice chips: Not tested   Thin Liquid Thin Liquid: Within functional limits Presentation: Cup;Straw;Self Fed Other Comments: cough x1 of 10 boluses, pt talking while trying to swallow     Nectar Thick Nectar Thick Liquid: Not tested   Honey Thick Honey Thick Liquid: Not tested   Puree Puree: Not tested   Solid   GO    Solid: Within functional limits Presentation: Self Fed Other Comments: cracker      Donavan Burnet, MS [facility] SLP [phone]

## 2013-04-03 NOTE — Discharge Summary (Signed)
Physician Discharge Summary  Richard Chavez:[id number] DOB: [birth year] DOA: 04/01/2013  PCP: Thayer Headings, MD  Admit date: 04/01/2013 Discharge date: 04/03/2013  Time spent: 35 minutes  Recommendations for Outpatient Follow-up:  1. BMP 1 week re Cr 2. Needs follow up with cards for lasix adjustment 3. Consider outpatient MBS- patient resistant in hospital  Discharge Diagnoses:  Active Problems:   Hypertension   COPD (chronic obstructive pulmonary disease)   CAD CABG 1998, patent grafts 06/06/11 after abn Myoview   Chronic combined systolic and diastolic congestive heart failure   CAP (community acquired pneumonia)   Discharge Condition: improved  Diet recommendation: cardiac  Filed Weights   04/01/13 1745 04/01/13 1952 04/02/13 2022  Weight: 112.129 kg (247 lb 3.2 oz) 111.403 kg (245 lb 9.6 oz) 111.403 kg (245 lb 9.6 oz)    History of present illness:  Richard Chavez is a 79 y.o. male with h/o chronic systolic heart failure, multiple admissions for pneumonia, CVA, CAD s/p CABG, hypertension, was brought in by his son to ED for increased generalized weakness . On arrival he underwent a CXR showed increased infiltrates, fever of 100.3 and leukocytosis, he is referred to medical service for admission for management of pneumonia. He denies chest pain , reports increased pedal edema. He was seen by Dr Herbie Baltimore on Monday and his lasix was increased from 20 bid to 40 mg BID. He was also found to have acute renal failure. His troponin was negative. His 12 lead EKG shows multiple PVC'S with sinus brady.   Hospital Course:  CAP:  - levaquin.  - bronchodilators as needed.  - SLP eval in view of the multiple admissions for CAP- patient resistant to further work up- says "I eat fine"   Cad s/p cabg: denies any chest pain. Troponin negative.  Resume home medications- lasix at a lower dose due to AKI   Chronic systolic heart failure;  recently increased lasix to 40 mg bid.  - will  need close follow up with cardiology as an outpatient - reduced dose  Acute renal failure:  - probably from increasing the lasix to 40 mg BID.  - u/a and renal U/s ok   Fever, leukocytosis, :resolved   Procedures:  none  Consultations:  none  Discharge Exam: Filed Vitals:   04/03/13 0502  BP: 142/76  Pulse: 71  Temp: 98.8 F (37.1 C)  Resp: 18    General: hard of hearing, pleasant/cooperative Cardiovascular: rrr Respiratory: clear anterior  Discharge Instructions      Discharge Orders   Future Appointments Provider Department Dept Phone   06/24/2013 2:30 PM [facility] [place] Office [phone]   07/27/2013 11:30 AM Su Monks, PA-C [facility] [phone]   08/04/2013 2:30 PM Ronal Fear, NP [facility] [phone]   Future Orders Complete By Expires   (HEART FAILURE PATIENTS) Call MD:  Anytime you have any of the following symptoms: 1) 3 pound weight gain in 24 hours or 5 pounds in 1 week 2) shortness of breath, with or without a dry hacking cough 3) swelling in the hands, feet or stomach 4) if you have to sleep on extra pillows at night in order to breathe.  As directed    Diet - low sodium heart healthy  As directed    Discharge instructions  As directed    Comments:     PCP to consider swallow eval as an outpatient Take zantac BID as prescribed 24  hour supervision by family   Increase activity slowly  As directed        Medication List    STOP taking these medications       DSS 100 MG Caps      TAKE these medications       ADVAIR DISKUS 500-50 MCG/DOSE Aepb  Generic drug:  Fluticasone-Salmeterol  Inhale 1 puff into the lungs every 12 (twelve) hours.     apixaban 2.5 MG Tabs tablet  Commonly known as:  ELIQUIS  Take 1 tablet (2.5 mg total) by mouth 2 (two) times daily.     atorvastatin 20 MG tablet  Commonly known as:  LIPITOR  Take 20 mg by mouth daily with  breakfast.     COMBIVENT 18-103 MCG/ACT inhaler  Generic drug:  albuterol-ipratropium  Inhale 2 puffs into the lungs 2 (two) times daily.     Fish Oil 1000 MG Caps  Take 1 capsule by mouth daily.     fluticasone 50 MCG/ACT nasal spray  Commonly known as:  FLONASE  Place 1 spray into both nostrils 2 (two) times daily as needed for allergies or rhinitis.     furosemide 20 MG tablet  Commonly known as:  LASIX  Take 1 tablet (20 mg total) by mouth 2 (two) times daily.     gabapentin 300 MG capsule  Commonly known as:  NEURONTIN  Take 600 mg by mouth 3 (three) times daily.     guaiFENesin 600 MG 12 hr tablet  Commonly known as:  MUCINEX  Take 1,200 mg by mouth 2 (two) times daily.     levofloxacin 750 MG tablet  Commonly known as:  LEVAQUIN  Take 1 tablet (750 mg total) by mouth every other day.     loratadine 10 MG tablet  Commonly known as:  CLARITIN  Take 10 mg by mouth daily with breakfast.     metoprolol tartrate 25 MG tablet  Commonly known as:  LOPRESSOR  Take 1 tablet (25 mg total) by mouth 2 (two) times daily.     potassium chloride 10 MEQ tablet  Commonly known as:  K-DUR,KLOR-CON  Take 10 mEq by mouth 2 (two) times daily.     predniSONE 5 MG tablet  Commonly known as:  DELTASONE  Take 5 mg by mouth daily.     ranitidine 150 MG capsule  Commonly known as:  ZANTAC  Take 150 mg by mouth 2 (two) times daily.     SLOW-MAG 535 (64 MG) MG Tbcr  Generic drug:  Magnesium Chloride  Take 1 tablet by mouth 2 (two) times daily.     tiotropium 18 MCG inhalation capsule  Commonly known as:  SPIRIVA  Place 18 mcg into inhaler and inhale daily with breakfast.     Vitamin D (Ergocalciferol) 50000 UNITS Caps capsule  Commonly known as:  DRISDOL  Take 50,000 Units by mouth every 7 (seven) days. Take on Fridays     vitamin E 1000 UNIT capsule  Take 1,000 Units by mouth daily with breakfast.       No Known Allergies Follow-up Information   Follow up with  Thayer Headings, MD In 1 week.   Specialty:  Internal Medicine   Contact information:   [address] [place] [address] [phone]       Follow up with Marykay Lex, MD In 2 weeks.   Specialty:  Cardiology   Contact information:   [address] [place] [address] [phone]  The results of significant diagnostics from this hospitalization (including imaging, microbiology, ancillary and laboratory) are listed below for reference.    Significant Diagnostic Studies: Dg Chest 2 View  04/01/2013   CLINICAL DATA:  Weakness, leg swelling.  EXAM: CHEST  2 VIEW  COMPARISON:  10/01/2012  FINDINGS: Right-sided dual lead pacemaker is unchanged in appearance. Sternotomy wires and mediastinal surgical clips are again seen. The cardiac silhouette remains enlarged. Lung volumes are mildly improved compared to the prior exam, although there is persistent elevation of the right hemidiaphragm and evidence of right lung volume loss. Left mid and lower lung opacities have improved, with streaky residual opacity remaining in the left lung base. There is an area of more confluent opacity in the right midlung which has increased from the prior exam. Right lower lung airspace opacities have improved. No pleural effusion is identified. No acute osseous abnormality is identified.  IMPRESSION: Increased airspace opacity in the right mid lung with interval improvement of right lower lung and left lung opacities that were present on the prior study. Findings are concerning for pneumonia.   Electronically Signed   By: Sebastian Ache   On: 04/01/2013 16:17   US Renal  04/01/2013   CLINICAL DATA:  Elevated creatinine.  Previous cholecystectomy.  EXAM: RENAL/URINARY TRACT ULTRASOUND COMPLETE  COMPARISON:  CT abdomen and pelvis 10/03/2012  FINDINGS: Right Kidney:  Length: 11.2 cm. Diffuse parenchymal atrophy with increased echotexture suggesting chronic medical renal  disease. No hydronephrosis or solid mass demonstrated.  Left Kidney:  Length: 12.4 cm. Diffuse increased parenchymal echotexture consistent with medical renal disease. No hydronephrosis. 3 small exophytic cyst are demonstrated, 2 in the mid pole and 1 in the lower pole. The cysts measure from about 1.9 to 2 cm maximal dimension. No solid mass demonstrated.  Bladder:  No wall thickening or intraluminal filling defects. Prevoid bladder volume is measured at 271 mm. Postvoid images were not obtained.  IMPRESSION: Echogenic renal parenchymal appearance bilaterally most consistent with medical renal disease. No evidence of hydronephrosis. Benign-appearing cysts in the left kidney.   Electronically Signed   By: Burman Nieves M.D.   On: 04/01/2013 23:33    Microbiology: Recent Results (from the past 240 hour(s))  MRSA PCR SCREENING     Status: Abnormal   Collection Time    04/01/13  8:41 PM      Result Value Range Status   MRSA by PCR POSITIVE (*) NEGATIVE Final   Comment:            The GeneXpert MRSA Assay (FDA     approved for NASAL specimens     only), is one component of a     comprehensive MRSA colonization     surveillance program. It is not     intended to diagnose MRSA     infection nor to guide or     monitor treatment for     MRSA infections.     RESULT CALLED TO, READ BACK BY AND VERIFIED WITH:     CALLED TO RN [facility]cshner [facility] [place]ILL [phone] @2340      Labs: Basic Metabolic Panel:  Recent Labs Lab 03/30/13 1211 04/01/13 1524 04/03/13 0520  NA 138 136 132*  K 4.1 3.7 3.3*  CL 99 98 96  CO2 27 25 25   GLUCOSE 85 105* 94  BUN 23 24* 19  CREATININE 1.73* 1.85* 1.60*  CALCIUM 9.3 8.7 8.0*  MG 2.2  --   --    Liver Function Tests:  Recent Labs Lab 03/30/13 1211 04/01/13 1524  AST 16 15  ALT 11 12  ALKPHOS 50 51  BILITOT 1.0 1.7*  PROT 6.8 6.3  ALBUMIN 4.0 3.2*   No results found for this basename: LIPASE, AMYLASE,  in the last 168 hours No results found for this basename:  AMMONIA,  in the last 168 hours CBC:  Recent Labs Lab 04/01/13 1524 04/03/13 0520  WBC 16.6* 7.5  NEUTROABS 13.7*  --   HGB 13.8 12.6*  HCT 42.6 37.1*  MCV 89.5 89.4  PLT 158 145*   Cardiac Enzymes: No results found for this basename: CKTOTAL, CKMB, CKMBINDEX, TROPONINI,  in the last 168 hours BNP: BNP (last 3 results)  Recent Labs  10/01/12 0849 10/03/12 0625 04/01/13 2115  PROBNP 1059.0* 993.5* 1693.0*   CBG:  Recent Labs Lab 04/01/13 1426  GLUCAP 97       Signed:  Dontel Harshberger  [facility] 04/03/2013, 9:46 AM

## 2013-04-23 ENCOUNTER — Encounter: Payer: Self-pay | Admitting: Cardiology

## 2013-04-23 ENCOUNTER — Ambulatory Visit (INDEPENDENT_AMBULATORY_CARE_PROVIDER_SITE_OTHER): Payer: Medicare Other | Admitting: Cardiology

## 2013-04-23 VITALS — BP 120/82 | HR 74 | Ht 74.0 in | Wt 247.9 lb

## 2013-04-23 DIAGNOSIS — I255 Ischemic cardiomyopathy: Secondary | ICD-10-CM

## 2013-04-23 DIAGNOSIS — I10 Essential (primary) hypertension: Secondary | ICD-10-CM

## 2013-04-23 DIAGNOSIS — I2510 Atherosclerotic heart disease of native coronary artery without angina pectoris: Secondary | ICD-10-CM

## 2013-04-23 DIAGNOSIS — I5042 Chronic combined systolic (congestive) and diastolic (congestive) heart failure: Secondary | ICD-10-CM

## 2013-04-23 DIAGNOSIS — Z79899 Other long term (current) drug therapy: Secondary | ICD-10-CM

## 2013-04-23 DIAGNOSIS — R600 Localized edema: Secondary | ICD-10-CM

## 2013-04-23 DIAGNOSIS — R609 Edema, unspecified: Secondary | ICD-10-CM

## 2013-04-23 DIAGNOSIS — I25810 Atherosclerosis of coronary artery bypass graft(s) without angina pectoris: Secondary | ICD-10-CM

## 2013-04-23 DIAGNOSIS — I509 Heart failure, unspecified: Secondary | ICD-10-CM

## 2013-04-23 DIAGNOSIS — E7800 Pure hypercholesterolemia, unspecified: Secondary | ICD-10-CM

## 2013-04-23 DIAGNOSIS — I480 Paroxysmal atrial fibrillation: Secondary | ICD-10-CM

## 2013-04-23 DIAGNOSIS — I4891 Unspecified atrial fibrillation: Secondary | ICD-10-CM

## 2013-04-23 DIAGNOSIS — R0609 Other forms of dyspnea: Secondary | ICD-10-CM

## 2013-04-23 DIAGNOSIS — I2589 Other forms of chronic ischemic heart disease: Secondary | ICD-10-CM

## 2013-04-23 LAB — COMPREHENSIVE METABOLIC PANEL
ALT: 11 U/L (ref 0–53)
Albumin: 4 g/dL (ref 3.5–5.2)
Alkaline Phosphatase: 53 U/L (ref 39–117)
BUN: 21 mg/dL (ref 6–23)
Calcium: 9.4 mg/dL (ref 8.4–10.5)
Chloride: 103 mEq/L (ref 96–112)
Glucose, Bld: 78 mg/dL (ref 70–99)
Potassium: 4.3 mEq/L (ref 3.5–5.3)
Sodium: 140 mEq/L (ref 135–145)
Total Protein: 6.5 g/dL (ref 6.0–8.3)

## 2013-04-23 NOTE — Patient Instructions (Signed)
Please have labs done today -CMP, PRO-BNP  WILL ADJUST MEDICATION DEPENDING ON LAB RESULTS.  Your physician wants you to follow-up KEEP YOUR SCHEDULE APPOINTMENT  WITH  Dr Herbie Baltimore.   You will receive a reminder letter in the mail two months in advance. If you don't receive a letter, please call our office to schedule the follow-up appointment

## 2013-04-24 ENCOUNTER — Telehealth: Payer: Self-pay | Admitting: Pharmacist Clinician (PhC)/ Clinical Pharmacy Specialist

## 2013-04-24 NOTE — Telephone Encounter (Signed)
Spoke with son [name], explained pt to stay on lasix 20mg  bid and increase Eliquis to 5mg  bid.  (per protocols pt dose would be 2.5mg , but he is a rather large man (112 kg) with history of prior CVA, and Scr has improved so will move him back to full dose, but watch Scr levels in the near future) Son voiced understanding, did not need prescription for meds.

## 2013-04-24 NOTE — Telephone Encounter (Signed)
Message copied by Rosalee Kaufman on Fri Apr 24, 2013  2:21 PM ------      Message from: Marykay Lex      Created: Thu Apr 23, 2013 11:10 PM       Renal function still a bit abnormal.  For now stay on 20mg  bid Lasix unless wgt goes up.        Am attaching not to Belenda Cruise -- ? about appropriate Eliquis Dose-- ? Should be able to go back to regular dose? He was reduced to 2.5 mg bid while admitted for PNA with Cr increased to ~1.85.  - now ~1.6-1.7.            Marykay Lex, MD                   ------

## 2013-04-24 NOTE — Progress Notes (Signed)
Richard Chavez will notify patient of medication regarding lab results from 04/23/13

## 2013-04-25 NOTE — Assessment & Plan Note (Signed)
Maintain current dose of furosemide 20 mg daily. We'll reassess pro BNP and chemistry panel today to determine if we can titrate back up to previous standing dose of 40 mg twice a day.

## 2013-04-25 NOTE — Progress Notes (Signed)
PCP: Thayer Headings, MD  Clinic Note: Chief Complaint  Patient presents with  . Follow-up    2 week post-hosp for pneumona, c/o occ CP; bilat LE edema    HPI: Richard Chavez is a 79 y.o. male with a Cardiovascular History below who presents today for post hospital followup. I actually last saw Richard Chavez on November 24th after seen by Dr. Royann Shivers to evaluate his ICD. We increase his standing Lasix dose due to up to low readings of increased thoracic impedance as well as edema. Unfortunately he was recently admitted for her to be a pneumonia and was found to have mild acute on chronic renal insufficiency with creatinine up to 1.8. Multiple medications were adjusted with his beta blocker dose being reduced as well as his furosemide being reduced. They also reduced his Eliquis dose based on renal insufficiency.  Interval History: He actually says today he feels pretty well, he still concerned with the lower cavity edema that he has. He seemed to recover from the pneumonia but no significant coughing now. No fevers or chills. He's not had a positive urine output, dysuria or hematuria. Although he does have his edema, he denies any PND or orthopnea. He seems to be significantly improved from my saw him in Dr. Royann Shivers last saw him. He otherwise denies any resting or exertional chest tightness and pressure.  He does note occasional atypical sounding sharp chest discomfort that is more musculoskeletal sounding. He does have some exertional dyspnea but that is improved since before his November visit, and now that he has recovered from his recent bout of pneumonia.  The remainder of Cardiovascular ROS: positive for - dyspnea on exertion, edema and irregular heartbeat negative for - loss of consciousness, murmur, orthopnea, palpitations, paroxysmal nocturnal dyspnea, rapid heart rate or shortness of breath: Additional cardiac review of systems: Lightheadedness - no, dizziness - no, syncope/near-syncope - no;  TIA/amaurosis fugax - no Melena - no, hematochezia no; hematuria - no; nosebleeds - no; claudication - no  Past Medical History  Diagnosis Date  . CAD (coronary artery disease), native coronary artery     Status post CABG  . Hypertension   . Hyperlipidemia   . GERD (gastroesophageal reflux disease)   . COPD (chronic obstructive pulmonary disease)   . CRF (chronic renal failure)   . Barrett's esophagus   . OP (osteoporosis)   . CAD (coronary artery disease) of artery bypass graft 07/04/2011    LIMA-LAD; SVG-OM2-OM3, SVG-RCA -- all patent as of 05/27/11  . Syncope 07/04/2011  . Pacemaker 07/04/2011  . Cardiomyopathy, ischemic 07/2011; 09/2012    2D Echo - EF 35-40, mod dilated LA; Relook Echo 09/2012: EF 40-45%  . Myocardial infarction     25 yrs ago  . Peripheral vascular disease   . Arthritis   . BPH (benign prostatic hyperplasia)   . Polyp of colon     removed  . Sleep apnea     STOP BANG SCORE 4  . AV block, 3rd degree     Post St. Jude pacemaker, EF 35-40, does have intermittent atrial tachycardia, either PAt or Afib  . BPH (benign prostatic hypertrophy) 11/15/2011  . Paroxysmal atrial fibrillation - now rate controlled (formally with [facility] and Hospital) 10/03/2012  . CVA (cerebral infarction) 10/09/2012    Noted to be in atrial fibrillation with evaluation of pacemaker, prior to CVA. Right-sided MCA stroke with left arm weakness/mild paralysis -- notably improved    Prior Cardiac Evaluation and Past Surgical History: Past Surgical  History  Procedure Laterality Date  . Pacemaker placement  30-Sep-2005  . Cholecystectomy  Oct 01, 2003  . Coronary artery bypass graft  1978    X3  . Lesion excision      from penis  . Cardiac catheterization  06/06/2011    occluded proximal RCA, ostial LAD and mid Circumflex after OM 1.; [place] LIMA-LAD, patent SVG-OM1-OM 2, patent SVG-RCA. Reduced ejection fraction of 30-35%.  . Cardiac catheterization  06/14/2010    LIMA to LAD, SVG to RCA, SVG to OM1 Circumflex,  100% occluded RCA, 100% occluded circumfkex, 100% occluded LAD, no evidence of graft dysfunction, continue medical therapy  . Cardiac catheterization  04/12/2003    3 vessel coronary artery disease, continue medical treatment  . Cardiac catheterization  10/07/2000    Severe 3 vessel coronary artery disease, continue medical treatment  . Doppler echocardiography  10/02/2012    EF 40-45%; possible grade 1 diastolic dysfunction, but A. fib precludes this. Mild atrial dilatation bilaterally. Moderately elevated pulmonary pressures of 40 mmHg.   No Known Allergies  MEDICATIONS REVIEWED IN EPIC  History   Social History Narrative   Patient's son Noble Cicalese lives with him at home.   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    ROS: A comprehensive Review of Systems - Negative except Pertinent symptoms noted above. Admission to the edema, he still has some mild residual paresis of the left hand that also appeared to be more swollen than the right. No other residual symptoms from his stroke. No significant GI or GU symptoms otherwise negative.  PHYSICAL EXAM BP 120/82  Pulse 74  Ht 6\' 2"  (1.88 m)  Wt 247 lb 14.4 oz (112.447 kg)  BMI 31.82 kg/m2 General: A&O x 3, cooperative, appears stated age, no distress, mildly obese. Hard of hearing. Also difficult to comprehend his speech that is more due to his baseline accident then any slurred speech.  HEENT: [place]/AT, EOMI, MMM, anicteric sclera; he has puffy bags under his eyes.  Neck: no carotid bruit, no JVD and supple, symmetrical, trachea midline  Lungs: CTA B., normal percussion bilaterally and With only mildly decreased bibasal breath sounds. Mild interstitial sounds but no W./R./R.  Heart: normal apical impulse, regular rate with  irregularly irregular rhythm. Normal S1, S2 with no M./R./G.  Abdomen: soft, non-tender; bowel sounds normal; no masses, no organomegaly and Obese  Extremities: Now reduced to 2 + pedal and ankle edema.  Radial Pulses: 2+ and symmetric, but unable to palpate pedal pulses Neurologic: Mental status: Alert, oriented, thought content appropriate, affect: normal ; left hand grip is up to4/5, and his arm strength is 4-5/5. He does have decreased fine touch and fine motor movement of the hand. Cranial nerves: Grossly intact  [id number] today: Yes Rate:74 , Rhythm:  a sensed V. paced, 1 PVC ;  unable to determine underlying atrial rhythm  Recent Labs:    November 28: Creatinine was 1.6 (down from 1.5); proBNP on November 26 was 1691-10-01.  ASSESSMENT / PLAN: Chronic kidney disease, stage III (moderate) I am somewhat concerned that his renal function to return for worse. Interestingly he had been on a higher dose of Lasix before I saw him and actually somewhat reduce the dose. It took several  days after that for his renal function to peak in abnormality to 1.5 creatinine. This postoperatively he is renal function is.  For now will maintain lower dose of standing Lasix as long as his weight is stable. They have also reduce his beta blocker dose which I will keep as is for now but we'll likely titrate back up again.  Will recheck chemistry panel today along with proBNP.  CAD CABG 1998, patent grafts 06/06/11 after abn Myoview Thankfully, he is not actively having any angina symptoms. He remains on aspirin along with the Eliquis. He is on beta blocker as well as statin plus fish oil.   Cardiomyopathy, ischemic, EF 30-35% by cath; 40-45 by echocardiogram in May 2014 He seems to be close to his dry weight at home scale. Did not tolerate increasing diuretic dosing.  He is due for a repeat check of his ICD by Dr. Royann Shivers in February. Also the need to monitor the thoracic impedance levels. Agree with backing  off on beta blocker to allow for more renal perfusion.  Chronic combined systolic and diastolic congestive heart failure Maintain current dose of furosemide 20 mg daily. We'll reassess pro BNP and chemistry panel today to determine if we can titrate back up to previous standing dose of 40 mg twice a day.  Hypertension Blood pressure was good today.  Paroxysmal atrial fibrillation He seems to be doing well with rate control. Currently atrial paced rhythm. He is on Eliquis, will need to reassess renal function to determine whether we can go back up to full dose treatment. I will asked Phillips Hay, RPH-CCP to review the results and contact him reference dosing.  Edema of both legs For now continue compression stockings. Continue with Lasix 20 twice a day. He is at his baseline weight (247 pounds this year which is roughly 2 and 42 pounds at home. My original plan was to for his goal weight to be 238-240 pounds, but will be happy with the current weight at this point.  He is not having heart failure symptoms, just edema.  Hypercholesteremia On Lipitor. Plan was to recheck in future followup.   Orders Placed This Encounter  Procedures  . Comprehensive metabolic panel  . Pro b natriuretic peptide (BNP)  . EKG 12-Lead   No orders of the defined types were placed in this encounter.   The patient has a complex, difficult medical history with multiple variables and complications. He requires extensive thought process and medical decision-making. Well over an hour was spent in direct patient contact as well as chart review was medical decision making including followup on orders from today.  Followup: as originally scheduled with Dr. Royann Shivers and Dr. Marykay Lex. Herbie Baltimore, M.D., M.S. [facility] [address] [place], [place]  [address]  [phone] Pager # [phone]

## 2013-04-25 NOTE — Assessment & Plan Note (Signed)
Thankfully, he is not actively having any angina symptoms. He remains on aspirin along with the Eliquis. He is on beta blocker as well as statin plus fish oil.

## 2013-04-25 NOTE — Assessment & Plan Note (Signed)
On Lipitor. Plan was to recheck in future followup.

## 2013-04-25 NOTE — Assessment & Plan Note (Signed)
He seems to be close to his dry weight at home scale. Did not tolerate increasing diuretic dosing.  He is due for a repeat check of his ICD by Dr. Royann Shivers in February. Also the need to monitor the thoracic impedance levels. Agree with backing off on beta blocker to allow for more renal perfusion.

## 2013-04-25 NOTE — Assessment & Plan Note (Signed)
He seems to be doing well with rate control. Currently atrial paced rhythm. He is on Eliquis, will need to reassess renal function to determine whether we can go back up to full dose treatment. I will asked Richard Chavez, RPH-CCP to review the results and contact him reference dosing.

## 2013-04-25 NOTE — Assessment & Plan Note (Signed)
Blood pressure was good today.

## 2013-04-25 NOTE — Assessment & Plan Note (Signed)
I am somewhat concerned that his renal function to return for worse. Interestingly he had been on a higher dose of Lasix before I saw him and actually somewhat reduce the dose. It took several days after that for his renal function to peak in abnormality to 1.5 creatinine. This postoperatively he is renal function is.  For now will maintain lower dose of standing Lasix as long as his weight is stable. They have also reduce his beta blocker dose which I will keep as is for now but we'll likely titrate back up again.  Will recheck chemistry panel today along with proBNP.

## 2013-04-25 NOTE — Assessment & Plan Note (Signed)
For now continue compression stockings. Continue with Lasix 20 twice a day. He is at his baseline weight (247 pounds this year which is roughly 2 and 42 pounds at home. My original plan was to for his goal weight to be 238-240 pounds, but will be happy with the current weight at this point.  He is not having heart failure symptoms, just edema.

## 2013-06-23 ENCOUNTER — Ambulatory Visit: Payer: Medicare Other | Admitting: Cardiology

## 2013-06-24 ENCOUNTER — Ambulatory Visit (INDEPENDENT_AMBULATORY_CARE_PROVIDER_SITE_OTHER): Payer: Medicare Other | Admitting: *Deleted

## 2013-06-24 DIAGNOSIS — I5042 Chronic combined systolic (congestive) and diastolic (congestive) heart failure: Secondary | ICD-10-CM

## 2013-06-24 DIAGNOSIS — I2589 Other forms of chronic ischemic heart disease: Secondary | ICD-10-CM

## 2013-06-24 DIAGNOSIS — I472 Ventricular tachycardia: Secondary | ICD-10-CM

## 2013-06-24 DIAGNOSIS — I480 Paroxysmal atrial fibrillation: Secondary | ICD-10-CM

## 2013-06-24 DIAGNOSIS — I509 Heart failure, unspecified: Secondary | ICD-10-CM

## 2013-06-24 DIAGNOSIS — I4729 Other ventricular tachycardia: Secondary | ICD-10-CM

## 2013-06-24 DIAGNOSIS — I255 Ischemic cardiomyopathy: Secondary | ICD-10-CM

## 2013-06-24 DIAGNOSIS — I4891 Unspecified atrial fibrillation: Secondary | ICD-10-CM

## 2013-06-24 LAB — PACEMAKER DEVICE OBSERVATION

## 2013-06-26 LAB — MDC_IDC_ENUM_SESS_TYPE_INCLINIC
Battery Impedance: 2200 Ohm
Battery Voltage: 2.85 V
Brady Statistic RV Percent Paced: 24 %
Date Time Interrogation Session: 20150218192948
Implantable Pulse Generator Serial Number: 1154837
Lead Channel Impedance Value: 450 Ohm
Lead Channel Pacing Threshold Amplitude: 0.75 V
Lead Channel Pacing Threshold Amplitude: 1 V
Lead Channel Pacing Threshold Pulse Width: 0.5 ms
Lead Channel Sensing Intrinsic Amplitude: 12 mV
Lead Channel Setting Pacing Amplitude: 2 V
Lead Channel Setting Pacing Amplitude: 2.5 V
Lead Channel Setting Pacing Pulse Width: 0.5 ms
Lead Channel Setting Sensing Sensitivity: 2.5 mV
MDC IDC MSMT LEADCHNL RA PACING THRESHOLD PULSEWIDTH: 0.5 ms
MDC IDC MSMT LEADCHNL RA SENSING INTR AMPL: 1.6 mV
MDC IDC MSMT LEADCHNL RV IMPEDANCE VALUE: 605 Ohm
MDC IDC STAT BRADY RA PERCENT PACED: 75 %

## 2013-06-26 NOTE — Progress Notes (Signed)
Pacemaker check in clinic. Normal device function. Thresholds, sensing, impedances consistent with previous measurements. Device programmed to maximize longevity. 25 mode switches (<1%)---max dur. 18 sec, Max A 274---no EGMs + Eliquis. No high ventricular rates noted. Device programmed at appropriate safety margins. Histogram distribution appropriate for patient activity level. Device programmed to optimize intrinsic conduction. Estimated longevity 1.5-2.5 years. Patient will follow up with the device clinic in 3 months and with [facility] in 6 months.

## 2013-06-30 ENCOUNTER — Ambulatory Visit: Payer: Medicare Other | Admitting: Cardiology

## 2013-07-09 ENCOUNTER — Ambulatory Visit (INDEPENDENT_AMBULATORY_CARE_PROVIDER_SITE_OTHER): Payer: Medicare Other | Admitting: Cardiology

## 2013-07-09 ENCOUNTER — Encounter: Payer: Self-pay | Admitting: Cardiology

## 2013-07-09 VITALS — BP 100/50 | HR 74 | Ht 74.0 in | Wt 249.4 lb

## 2013-07-09 DIAGNOSIS — Z950 Presence of cardiac pacemaker: Secondary | ICD-10-CM

## 2013-07-09 DIAGNOSIS — I2589 Other forms of chronic ischemic heart disease: Secondary | ICD-10-CM

## 2013-07-09 DIAGNOSIS — I4891 Unspecified atrial fibrillation: Secondary | ICD-10-CM

## 2013-07-09 DIAGNOSIS — I25810 Atherosclerosis of coronary artery bypass graft(s) without angina pectoris: Secondary | ICD-10-CM

## 2013-07-09 DIAGNOSIS — I480 Paroxysmal atrial fibrillation: Secondary | ICD-10-CM

## 2013-07-09 DIAGNOSIS — I509 Heart failure, unspecified: Secondary | ICD-10-CM

## 2013-07-09 DIAGNOSIS — I5042 Chronic combined systolic (congestive) and diastolic (congestive) heart failure: Secondary | ICD-10-CM

## 2013-07-09 NOTE — Patient Instructions (Signed)
Increase Lasix for the next 2 days. Take 40 mg in the morning and 20 mg at night. Starting Sunday 07/12/13 resume regular dosing of 20 mg in the morning and 20 mg at night. Weigh yourself daily and increase Lasix dose by 1 tablet if you gain more than 3 lbs in a 24 hr period.

## 2013-07-12 ENCOUNTER — Encounter: Payer: Self-pay | Admitting: Cardiology

## 2013-07-12 NOTE — Assessment & Plan Note (Addendum)
Normal sinus rhythm on EKG today. HR 74 bpm. Denies any recent sypmtoms of palpitations, SOB, dizziness, syncoep/ near syncope. Continue Lopressor for rate control and Eliquis for A/C.

## 2013-07-12 NOTE — Assessment & Plan Note (Addendum)
Fairly stable. He denies SOB, orthopnea, PND and weight gain. He has no JVD on physical exam and lungs are CTAB. However, he has 2+ bilateral LEE on exam today. He takes 20 mg of oral Lasix BID. I have instructed him to take 40 mg in the am and 20 mg in the evening for the next 3 days to help with LEE/volume status. He is to weigh himself daily and adjust lasix dose accordingly. He was provided sliding scale instructions. Continue low sodium diet.

## 2013-07-12 NOTE — Assessment & Plan Note (Signed)
Followed by Dr. Croitoru 

## 2013-07-12 NOTE — Progress Notes (Signed)
Patient ID: Richard Chavez, male   DOB: 01-Jan-1927, 80 y.o.   MRN: [id number]    07/12/2013 Richard ShuttersFred M Chavez   01-Jan-1927  [id number]  Primary Physicia Thayer HeadingsMACKENZIE,BRIAN, MD Primary Cardiologist: Dr. Herbie BaltimoreHarding  HPI:  The patient is a 80 y/o male, followed by Dr. Herbie BaltimoreHarding, who presents for routien follow-up. He has a h/o CAD with MI 25 years ago, s/p CABG x 4 with LIMA-LAD; SVG-OM2-OM3, SVG-RCA -- all patent as of 05/27/11, ischemic cardiomyopathy with a past EF of 35-40%, with improvement to 40-45% on last 2D echo 09/2012. He has an ICD/ pacemaker, followed by Dr. Royann Shiversroitoru. He also has PAF, on Eliquis for anticoagulation, and has HTN and renal insufficiency with baseline Scr of 1.8. In 2014, he suffered a stroke, which luckily only left him with mild residual left hand weakness.  In December 2014, he was hospitalized for CAP. He was seen by Dr. Herbie BaltimoreHarding for post hospital f/u and was instructed to return in 2 months for re-assessment.  He presents to clinic today accompanied by his son. He reports that he has been doing well. He denies SOB, orthopnea and PND, but does note some LEE. He weighs himself daily and denies any significant weight changes from his baseline. He reports daily medication compliance and adherence to a low sodium diet. He also denies chest pain, palpitations, syncope/ near syncope. He denies any abnormal bleeding from his Eliquis. No melena, hematochezia, hemoptysis, epistaxis. He denies falls.     Current Outpatient Prescriptions  Medication Sig Dispense Refill  . albuterol-ipratropium (COMBIVENT) 18-103 MCG/ACT inhaler Inhale 2 puffs into the lungs 2 (two) times daily.       Marland Kitchen. apixaban (ELIQUIS) 2.5 MG TABS tablet Take 1 tablet (2.5 mg total) by mouth 2 (two) times daily.  60 tablet  0  . atorvastatin (LIPITOR) 20 MG tablet Take 20 mg by mouth daily with breakfast.       . fluticasone (FLONASE) 50 MCG/ACT nasal spray Place 1 spray into both nostrils 2 (two) times daily as needed for  allergies or rhinitis.      . Fluticasone-Salmeterol (ADVAIR DISKUS) 500-50 MCG/DOSE AEPB Inhale 1 puff into the lungs every 12 (twelve) hours.       . furosemide (LASIX) 20 MG tablet Take 1 tablet (20 mg total) by mouth 2 (two) times daily.  60 tablet  0  . gabapentin (NEURONTIN) 300 MG capsule Take 600 mg by mouth 3 (three) times daily.       Marland Kitchen. guaiFENesin (MUCINEX) 600 MG 12 hr tablet Take 1,200 mg by mouth 2 (two) times daily.      Marland Kitchen. loratadine (CLARITIN) 10 MG tablet Take 10 mg by mouth daily with breakfast.       . Magnesium Chloride (SLOW-MAG) 535 (64 MG) MG TBCR Take 1 tablet by mouth 2 (two) times daily.       . metoprolol (LOPRESSOR) 25 MG tablet Take 1 tablet (25 mg total) by mouth 2 (two) times daily.  60 tablet  0  . Omega-3 Fatty Acids (FISH OIL) 1000 MG CAPS Take 1 capsule by mouth daily.       . potassium chloride (K-DUR,KLOR-CON) 10 MEQ tablet Take 10 mEq by mouth 2 (two) times daily.      . predniSONE (DELTASONE) 5 MG tablet Take 5 mg by mouth daily.      . ranitidine (ZANTAC) 150 MG capsule Take 150 mg by mouth 2 (two) times daily.       Marland Kitchen. tiotropium (SPIRIVA)  18 MCG inhalation capsule Place 18 mcg into inhaler and inhale daily with breakfast.       . Vitamin D, Ergocalciferol, (DRISDOL) 50000 UNITS CAPS Take 50,000 Units by mouth every 7 (seven) days. Take on Fridays      . vitamin E 1000 UNIT capsule Take 1,000 Units by mouth daily with breakfast.        No current facility-administered medications for this visit.    No Known Allergies  History   Social History  . Marital Status: Widowed    Spouse Name: N/A    Number of Children: 1  . Years of Education: 8   Occupational History  . RETIRED [employer]   Social History Main Topics  . Smoking status: Former Smoker -- 0.30 packs/day for 15 years    Types: Cigarettes    Quit date: 05/07/1985  . Smokeless tobacco: Never Used  . Alcohol Use: No  . Drug Use: No  . Sexual Activity: No   Other Topics Concern   . Not on file   Social History Narrative   Patient's son Kalani Sthilaire lives with him at home.   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     Review of Systems: General: negative for chills, fever, night sweats or weight changes.  Cardiovascular: negative for chest pain, dyspnea on exertion, orthopnea, palpitations, paroxysmal nocturnal dyspnea or shortness of breath; positive for edema Dermatological: negative for rash Respiratory: negative for cough or wheezing Urologic: negative for hematuria Abdominal: negative for nausea, vomiting, diarrhea, bright red blood per rectum, melena, or hematemesis Neurologic: negative for visual changes, syncope, or dizziness All other systems reviewed and are otherwise negative except as noted above.    Blood pressure 100/50, pulse 74, height 6\' 2"  (1.88 m), weight 249 lb 6.4 oz (113.127 kg).  General appearance: alert, cooperative and no distress Neck: no carotid bruit and no JVD Lungs: clear to auscultation bilaterally Heart: regular rate and rhythm Extremities: 2+ bilateral LEE Pulses: 2+ and symmetric Skin: warm and dry Neurologic: Grossly normal  EKG NSR, ventricular rate 74 bpm  ASSESSMENT AND PLAN:   Chronic combined systolic and diastolic congestive heart failure Fairly stable. He denies SOB, orthopnea, PND and weight gain. He has no JVD on physical exam and lungs are CTAB. However, he has 2+ bilateral LEE on exam today. He takes 20 mg of oral Lasix BID. I have instructed him to take 40 mg in the am and 20 mg in the evening for the next 3 days to help with LEE/volume status. He is to weigh himself daily and adjust lasix dose accordingly. He was provided sliding scale instructions. Continue low sodium diet.   Paroxysmal atrial  fibrillation Normal sinus rhythm on EKG today. HR 74 bpm. Denies any recent sypmtoms of palpitations, SOB, dizziness, syncope/ near syncope. Continue Lopressor for rate control and Eliquis for A/C.  CAD CABG 09-29-96, patent grafts 06/06/11 after abn Myoview Stable. Denies chest pain and dyspnea.   Pacemaker, St Jude, implanted 09/30/06 Followed by Dr. Royann Shivers    PLAN  Increase lasix dose for the next 3 days as mentioned above to help with volume status. He is otherwise stable and needs to f/u with Dr. Herbie Baltimore in 3 months. Continue  with scheduled pacer checks w/ Dr. Royann Shivers.   Sol Odor, [id number] 07/12/2013 10:57 AM

## 2013-07-12 NOTE — Assessment & Plan Note (Signed)
Stable. Denies chest pain and dyspnea.

## 2013-07-17 ENCOUNTER — Encounter: Payer: Self-pay | Admitting: Cardiology

## 2013-07-27 ENCOUNTER — Encounter: Payer: Self-pay | Admitting: Physical Medicine & Rehabilitation

## 2013-07-27 ENCOUNTER — Encounter: Payer: Medicare Other | Attending: Physical Medicine & Rehabilitation

## 2013-07-27 ENCOUNTER — Ambulatory Visit: Payer: Medicare Other | Admitting: Physical Medicine and Rehabilitation

## 2013-07-27 ENCOUNTER — Ambulatory Visit (HOSPITAL_BASED_OUTPATIENT_CLINIC_OR_DEPARTMENT_OTHER): Payer: Medicare Other | Admitting: Physical Medicine & Rehabilitation

## 2013-07-27 VITALS — BP 135/64 | HR 73 | Resp 14 | Ht 62.0 in | Wt 241.0 lb

## 2013-07-27 DIAGNOSIS — Z951 Presence of aortocoronary bypass graft: Secondary | ICD-10-CM | POA: Insufficient documentation

## 2013-07-27 DIAGNOSIS — I6350 Cerebral infarction due to unspecified occlusion or stenosis of unspecified cerebral artery: Secondary | ICD-10-CM

## 2013-07-27 DIAGNOSIS — R29898 Other symptoms and signs involving the musculoskeletal system: Secondary | ICD-10-CM | POA: Insufficient documentation

## 2013-07-27 DIAGNOSIS — I69998 Other sequelae following unspecified cerebrovascular disease: Secondary | ICD-10-CM | POA: Insufficient documentation

## 2013-07-27 DIAGNOSIS — J4489 Other specified chronic obstructive pulmonary disease: Secondary | ICD-10-CM | POA: Insufficient documentation

## 2013-07-27 DIAGNOSIS — E785 Hyperlipidemia, unspecified: Secondary | ICD-10-CM | POA: Insufficient documentation

## 2013-07-27 DIAGNOSIS — Z950 Presence of cardiac pacemaker: Secondary | ICD-10-CM | POA: Insufficient documentation

## 2013-07-27 DIAGNOSIS — Z87891 Personal history of nicotine dependence: Secondary | ICD-10-CM | POA: Insufficient documentation

## 2013-07-27 DIAGNOSIS — I2510 Atherosclerotic heart disease of native coronary artery without angina pectoris: Secondary | ICD-10-CM | POA: Insufficient documentation

## 2013-07-27 DIAGNOSIS — I2589 Other forms of chronic ischemic heart disease: Secondary | ICD-10-CM

## 2013-07-27 DIAGNOSIS — I4891 Unspecified atrial fibrillation: Secondary | ICD-10-CM | POA: Insufficient documentation

## 2013-07-27 DIAGNOSIS — K219 Gastro-esophageal reflux disease without esophagitis: Secondary | ICD-10-CM | POA: Insufficient documentation

## 2013-07-27 DIAGNOSIS — I739 Peripheral vascular disease, unspecified: Secondary | ICD-10-CM | POA: Insufficient documentation

## 2013-07-27 DIAGNOSIS — N189 Chronic kidney disease, unspecified: Secondary | ICD-10-CM | POA: Insufficient documentation

## 2013-07-27 DIAGNOSIS — I252 Old myocardial infarction: Secondary | ICD-10-CM | POA: Insufficient documentation

## 2013-07-27 DIAGNOSIS — Z9089 Acquired absence of other organs: Secondary | ICD-10-CM | POA: Insufficient documentation

## 2013-07-27 DIAGNOSIS — G4730 Sleep apnea, unspecified: Secondary | ICD-10-CM | POA: Insufficient documentation

## 2013-07-27 DIAGNOSIS — M810 Age-related osteoporosis without current pathological fracture: Secondary | ICD-10-CM | POA: Insufficient documentation

## 2013-07-27 DIAGNOSIS — I6381 Other cerebral infarction due to occlusion or stenosis of small artery: Secondary | ICD-10-CM

## 2013-07-27 DIAGNOSIS — I129 Hypertensive chronic kidney disease with stage 1 through stage 4 chronic kidney disease, or unspecified chronic kidney disease: Secondary | ICD-10-CM | POA: Insufficient documentation

## 2013-07-27 DIAGNOSIS — J449 Chronic obstructive pulmonary disease, unspecified: Secondary | ICD-10-CM | POA: Insufficient documentation

## 2013-07-27 NOTE — Progress Notes (Signed)
Subjective:    Patient ID: Richard Chavez, male    DOB: 1927-03-20, 80 y.o.   MRN: [id number] [name] is a 80 y.o. left handed male with history of a right subcortical stroke with left HP, on 10/01/12.  He is doing considerably well. He states that his walking is pretty stable, he did not have a recent fall. He only complains about some weakness and mild difficulty controlling of his LUE. He lives with his son, but is independent with the activities of daily life  HPI Went through [name] and outpt No falls\ Asking about insurance questions such as whether he can get disability insurance payment Pain Inventory Average Pain 3 Pain Right Now 3 My pain is intermittent and sharp  In the last 24 hours, has pain interfered with the following? General activity 2 Relation with others 2 Enjoyment of life 2 What TIME of day is your pain at its worst? evening Sleep (in general) Good  Pain is worse with: walking Pain improves with: rest Relief from Meds: 5  Mobility use a cane ability to climb steps?  yes  Function retired  Neuro/Psych tingling  Prior Studies Any changes since last visit?  no  Physicians involved in your care Dr Berniece Andreas, Dr Shayne Alken   Family History  Problem Relation Age of Onset  . Leukemia Father   . Heart disease Father   . Alzheimer's disease Mother   . Arthritis Mother   . Lung cancer Son 32  . Alzheimer's disease Maternal Grandmother    History   Social History  . Marital Status: Widowed    Spouse Name: N/A    Number of Children: 1  . Years of Education: 8   Occupational History  . RETIRED [employer]   Social History Main Topics  . Smoking status: Former Smoker -- 0.30 packs/day for 15 years    Types: Cigarettes    Quit date: 05/07/1985  . Smokeless tobacco: Never Used  . Alcohol Use: No  . Drug Use: No  . Sexual Activity: No   Other Topics Concern  . None   Social History Narrative   Patient's son [name] lives with him  at home.   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   Past Surgical History  Procedure Laterality Date  . Pacemaker placement  09/02/05  . Cholecystectomy  09/03/03  . Coronary artery bypass graft  1978    X3  . Lesion excision      from penis  . Cardiac catheterization  06/06/2011    occluded proximal RCA, ostial LAD and mid Circumflex after OM 1.; [place] LIMA-LAD, patent SVG-OM1-OM 2, patent SVG-RCA. Reduced ejection fraction of 30-35%.  . Cardiac catheterization  06/14/2010    LIMA to LAD, SVG to RCA, SVG to OM1 Circumflex, 100% occluded RCA, 100% occluded circumfkex, 100% occluded LAD, no evidence of graft dysfunction, continue medical therapy  . Cardiac catheterization  04/12/2003    3 vessel coronary artery disease, continue medical treatment  . Cardiac catheterization  10/07/2000    Severe 3 vessel coronary artery disease, continue medical treatment  . Doppler echocardiography  10/02/2012    EF 40-45%; possible grade 1 diastolic dysfunction, but A. fib precludes this. Mild  atrial dilatation bilaterally. Moderately elevated pulmonary pressures of 40 mmHg.   Past Medical History  Diagnosis Date  . CAD (coronary artery disease), native coronary artery     Status post CABG  . Hypertension   . Hyperlipidemia   . GERD (gastroesophageal reflux disease)   . COPD (chronic obstructive pulmonary disease)   . CRF (chronic renal failure)   . Barrett's esophagus   . OP (osteoporosis)   . CAD (coronary artery disease) of artery bypass graft 07/04/2011    LIMA-LAD; SVG-OM2-OM3, SVG-RCA -- all patent as of 05/27/11  . Syncope 07/04/2011  . Pacemaker 07/04/2011  . Cardiomyopathy, ischemic 07/2011; 09/2012    2D Echo - EF 35-40, mod dilated LA; Relook Echo 09/2012: EF 40-45%  . Myocardial  infarction     25 yrs ago  . Peripheral vascular disease   . Arthritis   . BPH (benign prostatic hyperplasia)   . Polyp of colon     removed  . Sleep apnea     STOP BANG SCORE 4  . AV block, 3rd degree     Post St. Jude pacemaker, EF 35-40, does have intermittent atrial tachycardia, either PAt or Afib  . BPH (benign prostatic hypertrophy) 11/15/2011  . Paroxysmal atrial fibrillation - now rate controlled (formally with [facility] and Hospital) 10/03/2012  . CVA (cerebral infarction) 10/09/2012    Noted to be in atrial fibrillation with evaluation of pacemaker, prior to CVA. Right-sided MCA stroke with left arm weakness/mild paralysis -- notably improved    BP 135/64  Pulse 73  Resp 14  Ht 5\' 2"  (1.575 m)  Wt 241 lb (109.317 kg)  BMI 44.07 kg/m2  SpO2 95%  Opioid Risk Score:   Fall Risk Score: High Fall Risk (>13 points) (patient educated handout declined)   Review of Systems  Gastrointestinal: Positive for constipation.  Musculoskeletal: Positive for gait problem.  All other systems reviewed and are negative.       Objective:   Physical Exam  Nursing note and vitals reviewed. Constitutional: He is oriented to person, place, and time. He appears well-developed and well-nourished.  HENT:  Head: Normocephalic and atraumatic.  Eyes: Conjunctivae and EOM are normal. Pupils are equal, round, and reactive to light.  Neurological: He is alert and oriented to person, place, and time.  3 minus/5 strength in the finger flexors and extensors 4/5 in the biceps triceps and deltoid on left 5/5 right upper extremity right lower extremity as well as left lower extremity Decreased fine motor left finger to thumb opposition Sensation appears mildly reduced to light touch left upper extremity  Psychiatric: He has a normal mood and affect.          Assessment & Plan:  1. Right subcortical infarct with residual left upper extremity weakness as well as decreased fine motor. As I discussed with  patient and his son he has reached a functional and neurologic plateau. I do not think he will get any significant neurologic improvement at this point. Have emphasized need to continue home exercise program to avoid decline in function. Return to clinic when necessary Medical followup with primary care physician

## 2013-08-04 ENCOUNTER — Ambulatory Visit: Payer: Medicare Other | Admitting: Nurse Practitioner

## 2013-08-05 ENCOUNTER — Ambulatory Visit (INDEPENDENT_AMBULATORY_CARE_PROVIDER_SITE_OTHER): Payer: Medicare Other | Admitting: Nurse Practitioner

## 2013-08-05 ENCOUNTER — Ambulatory Visit: Payer: Medicare Other | Admitting: Nurse Practitioner

## 2013-08-05 ENCOUNTER — Encounter: Payer: Self-pay | Admitting: Nurse Practitioner

## 2013-08-05 VITALS — BP 137/82 | HR 70 | Ht 72.5 in | Wt 246.0 lb

## 2013-08-05 DIAGNOSIS — I658 Occlusion and stenosis of other precerebral arteries: Secondary | ICD-10-CM

## 2013-08-05 DIAGNOSIS — I6523 Occlusion and stenosis of bilateral carotid arteries: Secondary | ICD-10-CM

## 2013-08-05 DIAGNOSIS — I6529 Occlusion and stenosis of unspecified carotid artery: Secondary | ICD-10-CM

## 2013-08-05 DIAGNOSIS — I2589 Other forms of chronic ischemic heart disease: Secondary | ICD-10-CM

## 2013-08-05 NOTE — Patient Instructions (Signed)
PLAN: Continue Eliquis for secondary stroke prevention and maintain strict control of hypertension with blood pressure goal below 130/90, and lipids with LDL cholesterol goal below 100 mg/dL.  Repeat Carotid dopplers.  Followup in 6 months with Dr. Pearlean Brownie.

## 2013-08-05 NOTE — Progress Notes (Signed)
PATIENT: Richard ShuttersFred M Chavez DOB: 01/30/1927  REASON FOR VISIT: stroke follow up HISTORY FROM: patient  HISTORY OF PRESENT ILLNESS: Richard Chavez is an 80 y.o. male white male with PMH of CAD s/p CABG, CHF EF 30-35%, s/p PPM, HTN, and COPD presenting to the ED today 10/01/2012 alongside his son with complaints of worsening fatigue, generalized weakness, left arm weakness, SOB, fever, and chills. History is mainly obtained by son, Onalee HuaDavid, who explains that his father started complaining of left hand numbness yesterday morning (09/30/2012) ~11am. They went to the PCP office and returned home later that day thought to have numbness secondary to neuropathy. He was still moving his arm and able to drive and walk at that time, however, upon waking up this 10/02/28, he was confused, not moving his left arm at all, and had difficulty walking. Mr. Richard Chavez does not have any similar prior episodes or any history of CVA. His son does claim he takes an ASA daily, however, it is not on his medication list on epic and did not get any ASA this morning. Of note, he was recently discharged from the hospital on 09/17/12 for CAP on levofloxacin course.    02/03/13 (LL): Mr. Richard Chavez comes to office for stroke follow up. He reports that he is doing well, taking all of his medications as prescribed. He does not use an assistive device for walking, he reports no falls, and appears steady. He was changed to Eliquis after being found to have atrial fibrillation in the hospital. Patient denies medication side effects, with no signs of bleeding or excessive bruising. His metoprolol dose was recently doubled, BP today is 123/69, HR 68. His only complaint is constipation, which he states he takes milk of magnesia for. He is accompanied by his son. He has mild weakness on his left arm and hand. Cholesterol well-controlled.   UPDATE 08/05/13 (LL):  Mr. Richard Chavez comes to office for stroke follow up. He reports that he is doing well, taking all of his  medications as prescribed.  His recent lipid panel is well with total cholesterol and LDL below goal.  He reports being hospitalized with pneumonia over thanksgiving but recovered fully.   He is tolerating Eliquis without significant bruising or bleeding.  He complains of chronic left handed weakness from stroke but otherwise feels good.   REVIEW OF SYSTEMS: Full 14 system review of systems performed and notable only for:  Cough, wheezing, leg swelling, murmur, constipation, env allergies, walking difficulty, bruise/bleed easily.  ALLERGIES: No Known Allergies  HOME MEDICATIONS: Outpatient Prescriptions Prior to Visit  Medication Sig Dispense Refill  . albuterol-ipratropium (COMBIVENT) 18-103 MCG/ACT inhaler Inhale 2 puffs into the lungs 2 (two) times daily.       Marland Kitchen. apixaban (ELIQUIS) 2.5 MG TABS tablet Take 5 mg by mouth 2 (two) times daily.      Marland Kitchen. atorvastatin (LIPITOR) 20 MG tablet Take 20 mg by mouth daily with breakfast.       . fluticasone (FLONASE) 50 MCG/ACT nasal spray Place 1 spray into both nostrils 2 (two) times daily as needed for allergies or rhinitis.      . Fluticasone-Salmeterol (ADVAIR DISKUS) 500-50 MCG/DOSE AEPB Inhale 1 puff into the lungs every 12 (twelve) hours.       . furosemide (LASIX) 20 MG tablet Take 1 tablet (20 mg total) by mouth 2 (two) times daily.  60 tablet  0  . gabapentin (NEURONTIN) 300 MG capsule Take 600 mg by mouth 3 (three) times  daily.       . guaiFENesin (MUCINEX) 600 MG 12 hr tablet Take 1,200 mg by mouth 2 (two) times daily.      Marland Kitchen loratadine (CLARITIN) 10 MG tablet Take 10 mg by mouth daily with breakfast.       . Magnesium Chloride (SLOW-MAG) 535 (64 MG) MG TBCR Take 1 tablet by mouth 2 (two) times daily.       . metoprolol (LOPRESSOR) 25 MG tablet Take 1 tablet (25 mg total) by mouth 2 (two) times daily.  60 tablet  0  . Omega-3 Fatty Acids (FISH OIL) 1000 MG CAPS Take 1 capsule by mouth daily.       . potassium chloride (K-DUR,KLOR-CON) 10 MEQ  tablet Take 10 mEq by mouth 2 (two) times daily.      . predniSONE (DELTASONE) 5 MG tablet Take 5 mg by mouth daily.      . ranitidine (ZANTAC) 150 MG capsule Take 150 mg by mouth 2 (two) times daily.       Marland Kitchen tiotropium (SPIRIVA) 18 MCG inhalation capsule Place 18 mcg into inhaler and inhale daily with breakfast.       . Vitamin D, Ergocalciferol, (DRISDOL) 50000 UNITS CAPS Take 50,000 Units by mouth every 7 (seven) days. Take on Fridays      . vitamin E 1000 UNIT capsule Take 1,000 Units by mouth daily with breakfast.        No facility-administered medications prior to visit.    PHYSICAL EXAM  Filed Vitals:   08/05/13 0844  BP: 137/82  Pulse: 70  Height: 6' 0.5" (1.842 m)  Weight: 246 lb (111.585 kg)   Body mass index is 32.89 kg/(m^2).  Generalized: In no acute distress, obese elderly Caucasian male  Neck: Supple, soft carotid bruits  Cardiac: Irregular rate and rhythm, systolic murmur 2/6 Musculoskeletal: No deformity   Neurological examination  Mentation: Alert oriented to time, place, history taking, language fluent, and casual conversation  Cranial nerve II-XII: Pupils were equal round reactive to light extraocular movements were full, visual field were full on confrontational test. facial sensation and strength were normal. hearing was intact to finger rubbing bilaterally. Uvula tongue midline. head turning and shoulder shrug and were normal and symmetric.Tongue protrusion into cheek strength was normal.  MOTOR:  Mild left lower face asymmetry. Tongue midline. Mild LUE drift.LUE 4/5 weakness Mild diminished fine finger movements on left. Orbits right over left upper extremity. Mild left grip weak.Marland KitchenLLE 4/5 weakness.  SENSORY: Diminished left hemibody sensation.  COORDINATION: finger-nose-finger, heel-to-shin bilaterally, there was no truncal ataxia  REFLEXES: Symmetric 1+  GAIT/STATION: Rising up from seated position without assistance, normal stance, without trunk ataxia,  moderate stride, good arm swing, smooth turning, unable to perform tiptoe, and heel walking without difficulty. Romberg negative.    ASSESSMENT AND PLAN Mr. SHIGERU LAMBERTH is a 80 y.o. Male with left arm weakness. Initial CT imaging negative; unable to have MRI due to pacer. Suspect right brain infarct based on symptoms. Infarct felt to be thrombotic given location secondary to small vessel disease, but patient found to have afib as well. Patient with resultant left facial weakness, mild left hemiparesis.   PLAN: Continue Eliquis for secondary stroke prevention and maintain strict control of hypertension with blood pressure goal below 130/90, and lipids with LDL cholesterol goal below 100 mg/dL.  Repeat Carotid dopplers.  Followup in 6 months with Dr. Pearlean Brownie.   Orders Placed This Encounter  Procedures  . US Carotid Duplex Bilateral  Return in about 6 months (around 02/04/2014).  Ronal Fear, MSN, NP-C 08/05/2013, 9:20 AM [facility] [address], [address] [place], [place] [address] [phone]  Note: This document was prepared with digital dictation and possible smart phrase technology. Any transcriptional errors that result from this process are unintentional.

## 2013-08-19 ENCOUNTER — Other Ambulatory Visit: Payer: Medicare Other

## 2013-08-21 ENCOUNTER — Ambulatory Visit (INDEPENDENT_AMBULATORY_CARE_PROVIDER_SITE_OTHER): Payer: Medicare Other

## 2013-08-21 DIAGNOSIS — I6523 Occlusion and stenosis of bilateral carotid arteries: Secondary | ICD-10-CM

## 2013-08-21 DIAGNOSIS — I6529 Occlusion and stenosis of unspecified carotid artery: Secondary | ICD-10-CM

## 2013-08-26 ENCOUNTER — Telehealth: Payer: Self-pay | Admitting: Nurse Practitioner

## 2013-08-26 NOTE — Telephone Encounter (Signed)
Called to give son Richard Chavez carotid doppler results, minor improvement since previous study.  He acknowledged results and had no questions.

## 2013-09-23 ENCOUNTER — Ambulatory Visit (INDEPENDENT_AMBULATORY_CARE_PROVIDER_SITE_OTHER): Payer: Medicare Other | Admitting: *Deleted

## 2013-09-23 DIAGNOSIS — Z950 Presence of cardiac pacemaker: Secondary | ICD-10-CM

## 2013-09-23 DIAGNOSIS — I4729 Other ventricular tachycardia: Secondary | ICD-10-CM

## 2013-09-23 DIAGNOSIS — I472 Ventricular tachycardia, unspecified: Secondary | ICD-10-CM

## 2013-09-23 DIAGNOSIS — I495 Sick sinus syndrome: Secondary | ICD-10-CM

## 2013-09-23 LAB — MDC_IDC_ENUM_SESS_TYPE_INCLINIC
Battery Voltage: 2.8 V
Brady Statistic RA Percent Paced: 74 %
Brady Statistic RV Percent Paced: 24 %
Date Time Interrogation Session: 20150520180755
Implantable Pulse Generator Model: 5816
Implantable Pulse Generator Serial Number: 1154837
Lead Channel Impedance Value: 687 Ohm
Lead Channel Pacing Threshold Amplitude: 0.75 V
Lead Channel Pacing Threshold Pulse Width: 0.5 ms
Lead Channel Sensing Intrinsic Amplitude: 1.6 mV
Lead Channel Sensing Intrinsic Amplitude: 12 mV
Lead Channel Setting Pacing Amplitude: 2 V
Lead Channel Setting Pacing Amplitude: 2.5 V
Lead Channel Setting Sensing Sensitivity: 2.5 mV
MDC IDC MSMT BATTERY IMPEDANCE: 2400 Ohm
MDC IDC MSMT LEADCHNL RA IMPEDANCE VALUE: 500 Ohm
MDC IDC MSMT LEADCHNL RV PACING THRESHOLD AMPLITUDE: 0.75 V
MDC IDC MSMT LEADCHNL RV PACING THRESHOLD PULSEWIDTH: 0.5 ms
MDC IDC SET LEADCHNL RV PACING PULSEWIDTH: 0.5 ms

## 2013-09-23 LAB — PACEMAKER DEVICE OBSERVATION

## 2013-09-23 NOTE — Progress Notes (Signed)
Pacemaker check in clinic. Normal device function. Thresholds, sensing, impedances consistent with previous measurements. Device programmed to maximize longevity. 22 mode switches--- <1%, no EGMs, longest 8hrs + eliquis. Device programmed at appropriate safety margins. Histogram distribution appropriate for patient activity level. Device programmed to optimize intrinsic conduction. Estimated longevity 1.25-2.64yrs. ROV w/ Dr. Royann Shivers in 60mo.

## 2013-09-25 ENCOUNTER — Telehealth: Payer: Self-pay | Admitting: Cardiology

## 2013-09-25 NOTE — Telephone Encounter (Signed)
Closed encounter °

## 2013-10-28 ENCOUNTER — Encounter: Payer: Self-pay | Admitting: Cardiology

## 2013-10-28 ENCOUNTER — Ambulatory Visit (INDEPENDENT_AMBULATORY_CARE_PROVIDER_SITE_OTHER): Payer: Medicare Other | Admitting: Cardiology

## 2013-10-28 VITALS — BP 122/70 | HR 82 | Ht 74.0 in | Wt 249.8 lb

## 2013-10-28 DIAGNOSIS — I4891 Unspecified atrial fibrillation: Secondary | ICD-10-CM

## 2013-10-28 DIAGNOSIS — R600 Localized edema: Secondary | ICD-10-CM

## 2013-10-28 DIAGNOSIS — I480 Paroxysmal atrial fibrillation: Secondary | ICD-10-CM

## 2013-10-28 DIAGNOSIS — N183 Chronic kidney disease, stage 3 unspecified: Secondary | ICD-10-CM

## 2013-10-28 DIAGNOSIS — I10 Essential (primary) hypertension: Secondary | ICD-10-CM

## 2013-10-28 DIAGNOSIS — I509 Heart failure, unspecified: Secondary | ICD-10-CM

## 2013-10-28 DIAGNOSIS — I5042 Chronic combined systolic (congestive) and diastolic (congestive) heart failure: Secondary | ICD-10-CM

## 2013-10-28 DIAGNOSIS — R609 Edema, unspecified: Secondary | ICD-10-CM

## 2013-10-28 DIAGNOSIS — I2589 Other forms of chronic ischemic heart disease: Secondary | ICD-10-CM

## 2013-10-28 DIAGNOSIS — I255 Ischemic cardiomyopathy: Secondary | ICD-10-CM

## 2013-10-28 DIAGNOSIS — G4733 Obstructive sleep apnea (adult) (pediatric): Secondary | ICD-10-CM

## 2013-10-28 DIAGNOSIS — I25810 Atherosclerosis of coronary artery bypass graft(s) without angina pectoris: Secondary | ICD-10-CM

## 2013-10-28 NOTE — Progress Notes (Signed)
PCP: Richard Headings, MD  Clinic Note: Chief Complaint  Patient presents with  . 3 month visit    no chest pain , no sob , no edema    HPI: Richard Chavez is a 80 y.o. male with a Cardiovascular Problem List (CAD-CABG, Ischemic CM, PAF s/p PPM for SSS & CVA last year) below who presents today for 6 month f/u.  Last seen in December 2014 -- was recovering from R-sided CVA (during a hospitalization in the fall) & had notable improvement in L hand strength & coordination.  He had been noted to be a bit volume overloaded from his chronic combined CHF during a PPM follow-up with Richard Chavez increased the standing lasix dose .  We discussed sliding scale dosing. He saw Richard Lis, PA-C back in March for mild increase in edema -- she instructed him to increase Lasix for a few days & reset his dry weight accordingly, basically re-enforcing the sliding scale teaching.  Interval History: Today he really does not have any notable complaints.  He walked in with a much steadier gait, but uses a cane.  As has been usual, he is accompanied by his son, who had been present with him since the CVA hospitalization.  His son, Richard Chavez now takes care of his medications & titrates his lasix dosing -- for the most part they keep ahead of the edema, but have "ups & downs" usually related to what they eat. Otherwise, he has regained notable strength in his L hand, just still poor tactile skills. He notes occasional (not bothersome) palpitations, and DOE if he tries to go to fast (up stairs or up hills). No chest pain or shortness of breath with rest or exertion. No PND or orthopnea Occasional palpitations, but NO lightheadedness, dizziness, weakness or syncope/near syncope. No recurrent TIA/amaurosis fugax symptoms. No melena, hematochezia, hematuria, or epstaxis. No claudication.  Past Medical History  Diagnosis Date  . CAD (coronary artery disease), native coronary artery     Status post CABG  .  Hypertension   . Hyperlipidemia   . GERD (gastroesophageal reflux disease)   . COPD (chronic obstructive pulmonary disease)   . CRF (chronic renal failure)   . Barrett's esophagus   . OP (osteoporosis)   . CAD (coronary artery disease) of artery bypass graft 07/04/2011    LIMA-LAD; SVG-OM2-OM3, SVG-RCA -- all patent as of 05/27/11  . Syncope 07/04/2011  . Pacemaker 07/04/2011  . Cardiomyopathy, ischemic 07/2011; 09/2012    2D Echo - EF 35-40, mod dilated LA; Relook Echo 09/2012: EF 40-45%  . Myocardial infarction     25 yrs ago  . Peripheral vascular disease   . Arthritis   . BPH (benign prostatic hyperplasia)   . Polyp of colon     removed  . Sleep apnea     STOP BANG SCORE 4  . AV block, 3rd degree     Post St. Jude pacemaker, EF 35-40, does have intermittent atrial tachycardia, either PAt or Afib  . BPH (benign prostatic hypertrophy) 11/15/2011  . Paroxysmal atrial fibrillation - now rate controlled (formally with [facility] and Hospital) 10/03/2012  . CVA (cerebral infarction) 10/09/2012    Noted to be in atrial fibrillation with evaluation of pacemaker, prior to CVA. Right-sided MCA stroke with left arm weakness/mild paralysis -- notably improved     Prior Cardiac Evaluation and Past Surgical History: Past Surgical History  Procedure Laterality Date  . Pacemaker placement  2007  . Coronary artery bypass graft  1978    X3  . Cardiac catheterization  06/06/2011    occluded proximal RCA, ostial LAD and mid Circumflex after OM 1.; [place] LIMA-LAD, patent SVG-OM1-OM 2, patent SVG-RCA. Reduced ejection fraction of 30-35%.  . Cardiac catheterization  06/14/2010    LIMA to LAD, SVG to RCA, SVG to OM1 Circumflex, 100% occluded RCA, 100% occluded circumfkex, 100% occluded LAD, no evidence of graft dysfunction, continue medical therapy  . Cardiac catheterization  04/12/2003    3 vessel coronary artery disease, continue medical treatment  . Cardiac catheterization  10/07/2000    Severe 3 vessel coronary  artery disease, continue medical treatment  . Doppler echocardiography  10/02/2012    EF 40-45%; possible grade 1 diastolic dysfunction, but A. fib precludes this. Mild atrial dilatation bilaterally. Moderately elevated pulmonary pressures of 40 mmHg.    MEDICATIONS AND ALLERGIES REVIEWED IN EPIC No Change in Social and Family History  ROS: A comprehensive Review of Systems - Negative except persistent deficiencies in the Left hand tactile skills, but improved strenght & sensation.  PHYSICAL EXAM BP 122/70  Pulse 82  Ht 6\' 2"  (1.88 m)  Wt 249 lb 12.8 oz (113.309 kg)  BMI 32.06 kg/m2 General: A&O x 3, cooperative, appears stated age, no distress, mildly obese. Hard of hearing. Also difficult to comprehend his speech that is more due to his baseline accident then any slurred speech.  HEENT: [place]/AT, EOMI, MMM, anicteric sclera; he has puffy bags under his eyes.  Neck: no carotid bruit, no JVD and supple, symmetrical, trachea midline  Lungs: CTA B., normal percussion bilaterally and With only mildly decreased bibasal breath sounds. Mild interstitial sounds but no W./R./R.  Heart: normal apical impulse, regular rate with irregularly irregular rhythm. Normal S1, S2 with no M./R./G.  Abdomen: soft, non-tender; bowel sounds normal; no masses, no organomegaly and Obese  Extremities: Now reduced to 2 + pedal and ankle edema. Radial Pulses: 2+ and symmetric, but unable to palpate pedal pulses  Neurologic: Mental status: Alert, oriented, thought content appropriate, affect: normal ; left hand grip is up 4.5/5, and his arm strength is 4-5/5. He does have decreased fine touch and fine motor movement of the hand.  -- this has improved from last visit.  Cranial nerves: Grossly intact   Adult ECG Report  Rate: 82 ;  Rhythm: normal sinus rhythm and with intermittent A pacing.  Narrative Interpretation: stable EKG  Recent Labs : June 12  Na+ 138, K+ 4.1, Cl- 102, CO2- 28, BUN/Cr 19/1.8,Glu 89, Ca 9.0;  LFTs - normal  Lipids: TC 173, TG 113, LDL 91, HDL 59  CBC: W 6.2, Hgb 14.5, Plt 154   ASSESSMENT / PLAN: Cardiomyopathy, ischemic, EF 30-35% by cath; 40-45 by echocardiogram in May 2014 Weight is a bit up today from march visit. Discussed diuretic regimen. Continue standing Lasix dose, but take BID for worsening edema. On low dose BB,but no ACE-I or ARB for CKD.  Paroxysmal atrial fibrillation Maintaining NSR - BB for rate control.  On 1/2 dose Eliquis.    Chronic combined systolic and diastolic congestive heart failure Stable without HF symptoms -- besides edema.  Sliding Scale last because dosing   CAD CABG 1998, patent grafts 06/06/11 after abn Myoview No angina or significant dyspnea that cannot be explained by obesity & deconditioning with diastolic dysfunction.  OSA (obstructive sleep apnea) Use CPAP  Essential hypertension BP looks good today.  No change to medications.  Chronic kidney disease, stage III (moderate) Still urinating & responds  to titration of low dose Lasix.   Will monitor -- I am not sure how well he would do if he needed HD.  Edema of both legs Compression stockings with low dose Lasix. Other HF symptoms are stable.    No orders of the defined types were placed in this encounter.   No orders of the defined types were placed in this encounter.    Followup: 6 months  [name] W. Herbie BaltimoreHARDING, M.D., M.S. Interventional Cardiologist [facility]

## 2013-10-28 NOTE — Patient Instructions (Signed)
Alternating  ATORVASTATIN 40 MG (WHOLE TABLET) AND 20 MG (HALF A TABLET) EVERY OTHER DAY  CONTINUE WITH ALL OTHER CURRENT MEDICATION  Your physician wants you to follow-up in 6 MONTH Dr Herbie Baltimore.  You will receive a reminder letter in the mail two months in advance. If you don't receive a letter, please call our office to schedule the follow-up appointment.

## 2013-10-30 NOTE — Assessment & Plan Note (Signed)
Weight is a bit up today from march visit. Discussed diuretic regimen. Continue standing Lasix dose, but take BID for worsening edema. On low dose BB,but no ACE-I or ARB for CKD.

## 2013-10-30 NOTE — Assessment & Plan Note (Signed)
Stable without HF symptoms -- besides edema.  Sliding Scale last because dosing

## 2013-10-30 NOTE — Assessment & Plan Note (Signed)
Maintaining NSR - BB for rate control.  On 1/2 dose Eliquis.

## 2013-10-31 NOTE — Assessment & Plan Note (Signed)
BP looks good today.  No change to medications.

## 2013-10-31 NOTE — Assessment & Plan Note (Signed)
Use CPAP. 

## 2013-10-31 NOTE — Assessment & Plan Note (Signed)
Still urinating & responds to titration of low dose Lasix.   Will monitor -- I am not sure how well he would do if he needed HD.

## 2013-10-31 NOTE — Assessment & Plan Note (Signed)
No angina or significant dyspnea that cannot be explained by obesity & deconditioning with diastolic dysfunction.

## 2013-10-31 NOTE — Assessment & Plan Note (Signed)
Compression stockings with low dose Lasix. Other HF symptoms are stable.

## 2013-11-25 ENCOUNTER — Telehealth: Payer: Self-pay

## 2013-11-25 NOTE — Telephone Encounter (Deleted)
Rx was sent to pharmacy electronically. 

## 2013-11-26 MED ORDER — APIXABAN 5 MG PO TABS: 5.0000 mg | ORAL_TABLET | Freq: Two times a day (BID) | ORAL | Status: AC

## 2013-11-26 NOTE — Telephone Encounter (Signed)
Pt called and said he still have not received his Eliquis.Please call to [phone].

## 2013-11-26 NOTE — Telephone Encounter (Signed)
Rx was sent to pharmacy electronically. 

## 2014-01-12 ENCOUNTER — Telehealth: Payer: Self-pay | Admitting: Cardiovascular Disease

## 2014-01-13 NOTE — Telephone Encounter (Signed)
Close encounter 

## 2014-01-27 ENCOUNTER — Emergency Department (HOSPITAL_COMMUNITY): Payer: Medicare Other

## 2014-01-27 ENCOUNTER — Encounter (HOSPITAL_COMMUNITY): Payer: Self-pay | Admitting: Emergency Medicine

## 2014-01-27 ENCOUNTER — Inpatient Hospital Stay (HOSPITAL_COMMUNITY)
Admission: EM | Admit: 2014-01-27 | Discharge: 2014-02-06 | DRG: 291 | Disposition: A | Discharge status: Home Health | Payer: Medicare Other | Attending: Internal Medicine | Admitting: Internal Medicine

## 2014-01-27 DIAGNOSIS — R0602 Shortness of breath: Secondary | ICD-10-CM | POA: Diagnosis present

## 2014-01-27 DIAGNOSIS — I429 Cardiomyopathy, unspecified: Secondary | ICD-10-CM

## 2014-01-27 DIAGNOSIS — J9600 Acute respiratory failure, unspecified whether with hypoxia or hypercapnia: Secondary | ICD-10-CM | POA: Diagnosis present

## 2014-01-27 DIAGNOSIS — E875 Hyperkalemia: Secondary | ICD-10-CM | POA: Diagnosis not present

## 2014-01-27 DIAGNOSIS — I480 Paroxysmal atrial fibrillation: Secondary | ICD-10-CM | POA: Diagnosis present

## 2014-01-27 DIAGNOSIS — I10 Essential (primary) hypertension: Secondary | ICD-10-CM

## 2014-01-27 DIAGNOSIS — J441 Chronic obstructive pulmonary disease with (acute) exacerbation: Secondary | ICD-10-CM

## 2014-01-27 DIAGNOSIS — G4733 Obstructive sleep apnea (adult) (pediatric): Secondary | ICD-10-CM | POA: Diagnosis present

## 2014-01-27 DIAGNOSIS — I129 Hypertensive chronic kidney disease with stage 1 through stage 4 chronic kidney disease, or unspecified chronic kidney disease: Secondary | ICD-10-CM | POA: Diagnosis present

## 2014-01-27 DIAGNOSIS — R600 Localized edema: Secondary | ICD-10-CM

## 2014-01-27 DIAGNOSIS — I5042 Chronic combined systolic (congestive) and diastolic (congestive) heart failure: Secondary | ICD-10-CM

## 2014-01-27 DIAGNOSIS — J9601 Acute respiratory failure with hypoxia: Secondary | ICD-10-CM

## 2014-01-27 DIAGNOSIS — I5023 Acute on chronic systolic (congestive) heart failure: Principal | ICD-10-CM

## 2014-01-27 DIAGNOSIS — K2270 Barrett's esophagus without dysplasia: Secondary | ICD-10-CM

## 2014-01-27 DIAGNOSIS — I2510 Atherosclerotic heart disease of native coronary artery without angina pectoris: Secondary | ICD-10-CM | POA: Diagnosis present

## 2014-01-27 DIAGNOSIS — Z7952 Long term (current) use of systemic steroids: Secondary | ICD-10-CM | POA: Diagnosis not present

## 2014-01-27 DIAGNOSIS — X58XXXA Exposure to other specified factors, initial encounter: Secondary | ICD-10-CM | POA: Diagnosis not present

## 2014-01-27 DIAGNOSIS — J181 Lobar pneumonia, unspecified organism: Secondary | ICD-10-CM

## 2014-01-27 DIAGNOSIS — I369 Nonrheumatic tricuspid valve disorder, unspecified: Secondary | ICD-10-CM

## 2014-01-27 DIAGNOSIS — F0390 Unspecified dementia without behavioral disturbance: Secondary | ICD-10-CM | POA: Diagnosis present

## 2014-01-27 DIAGNOSIS — D649 Anemia, unspecified: Secondary | ICD-10-CM | POA: Diagnosis not present

## 2014-01-27 DIAGNOSIS — E785 Hyperlipidemia, unspecified: Secondary | ICD-10-CM | POA: Diagnosis present

## 2014-01-27 DIAGNOSIS — N183 Chronic kidney disease, stage 3 unspecified: Secondary | ICD-10-CM

## 2014-01-27 DIAGNOSIS — E876 Hypokalemia: Secondary | ICD-10-CM

## 2014-01-27 DIAGNOSIS — N401 Enlarged prostate with lower urinary tract symptoms: Secondary | ICD-10-CM | POA: Diagnosis present

## 2014-01-27 DIAGNOSIS — I69331 Monoplegia of upper limb following cerebral infarction affecting right dominant side: Secondary | ICD-10-CM | POA: Diagnosis not present

## 2014-01-27 DIAGNOSIS — I25810 Atherosclerosis of coronary artery bypass graft(s) without angina pectoris: Secondary | ICD-10-CM

## 2014-01-27 DIAGNOSIS — R310 Gross hematuria: Secondary | ICD-10-CM | POA: Diagnosis not present

## 2014-01-27 DIAGNOSIS — Z951 Presence of aortocoronary bypass graft: Secondary | ICD-10-CM

## 2014-01-27 DIAGNOSIS — E7800 Pure hypercholesterolemia, unspecified: Secondary | ICD-10-CM

## 2014-01-27 DIAGNOSIS — Z79899 Other long term (current) drug therapy: Secondary | ICD-10-CM

## 2014-01-27 DIAGNOSIS — J189 Pneumonia, unspecified organism: Secondary | ICD-10-CM | POA: Diagnosis present

## 2014-01-27 DIAGNOSIS — K219 Gastro-esophageal reflux disease without esophagitis: Secondary | ICD-10-CM | POA: Diagnosis present

## 2014-01-27 DIAGNOSIS — N182 Chronic kidney disease, stage 2 (mild): Secondary | ICD-10-CM | POA: Diagnosis present

## 2014-01-27 DIAGNOSIS — Z950 Presence of cardiac pacemaker: Secondary | ICD-10-CM

## 2014-01-27 DIAGNOSIS — J438 Other emphysema: Secondary | ICD-10-CM

## 2014-01-27 DIAGNOSIS — M810 Age-related osteoporosis without current pathological fracture: Secondary | ICD-10-CM | POA: Diagnosis present

## 2014-01-27 DIAGNOSIS — R319 Hematuria, unspecified: Secondary | ICD-10-CM

## 2014-01-27 DIAGNOSIS — Z87891 Personal history of nicotine dependence: Secondary | ICD-10-CM

## 2014-01-27 DIAGNOSIS — I252 Old myocardial infarction: Secondary | ICD-10-CM | POA: Diagnosis not present

## 2014-01-27 DIAGNOSIS — J449 Chronic obstructive pulmonary disease, unspecified: Secondary | ICD-10-CM | POA: Diagnosis present

## 2014-01-27 DIAGNOSIS — I4891 Unspecified atrial fibrillation: Secondary | ICD-10-CM

## 2014-01-27 DIAGNOSIS — E669 Obesity, unspecified: Secondary | ICD-10-CM

## 2014-01-27 DIAGNOSIS — K5900 Constipation, unspecified: Secondary | ICD-10-CM | POA: Diagnosis present

## 2014-01-27 DIAGNOSIS — G8110 Spastic hemiplegia affecting unspecified side: Secondary | ICD-10-CM

## 2014-01-27 DIAGNOSIS — I484 Atypical atrial flutter: Secondary | ICD-10-CM

## 2014-01-27 DIAGNOSIS — R339 Retention of urine, unspecified: Secondary | ICD-10-CM

## 2014-01-27 DIAGNOSIS — R338 Other retention of urine: Secondary | ICD-10-CM | POA: Diagnosis present

## 2014-01-27 DIAGNOSIS — S3730XA Unspecified injury of urethra, initial encounter: Secondary | ICD-10-CM | POA: Diagnosis not present

## 2014-01-27 DIAGNOSIS — J690 Pneumonitis due to inhalation of food and vomit: Secondary | ICD-10-CM

## 2014-01-27 DIAGNOSIS — I255 Ischemic cardiomyopathy: Secondary | ICD-10-CM

## 2014-01-27 DIAGNOSIS — Y92239 Unspecified place in hospital as the place of occurrence of the external cause: Secondary | ICD-10-CM

## 2014-01-27 DIAGNOSIS — Z7901 Long term (current) use of anticoagulants: Secondary | ICD-10-CM | POA: Diagnosis not present

## 2014-01-27 DIAGNOSIS — I4892 Unspecified atrial flutter: Secondary | ICD-10-CM | POA: Diagnosis present

## 2014-01-27 HISTORY — DX: Pneumonia, unspecified organism: J18.9

## 2014-01-27 HISTORY — DX: Shortness of breath: R06.02

## 2014-01-27 LAB — POCT I-STAT 3, ART BLOOD GAS (G3+)
ACID-BASE EXCESS: 4 mmol/L — AB (ref 0.0–2.0)
BICARBONATE: 29.6 meq/L — AB (ref 20.0–24.0)
O2 Saturation: 100 %
PO2 ART: 314 mmHg — AB (ref 80.0–100.0)
TCO2: 31 mmol/L (ref 0–100)
pCO2 arterial: 48.5 mmHg — ABNORMAL HIGH (ref 35.0–45.0)
pH, Arterial: 7.394 (ref 7.350–7.450)

## 2014-01-27 LAB — URINALYSIS, ROUTINE W REFLEX MICROSCOPIC
Bilirubin Urine: NEGATIVE
GLUCOSE, UA: NEGATIVE mg/dL
HGB URINE DIPSTICK: NEGATIVE
Ketones, ur: NEGATIVE mg/dL
Leukocytes, UA: NEGATIVE
Nitrite: NEGATIVE
PH: 5 (ref 5.0–8.0)
Protein, ur: NEGATIVE mg/dL
SPECIFIC GRAVITY, URINE: 1.011 (ref 1.005–1.030)
Urobilinogen, UA: 0.2 mg/dL (ref 0.0–1.0)

## 2014-01-27 LAB — COMPREHENSIVE METABOLIC PANEL
ALT: 35 U/L (ref 0–53)
AST: 22 U/L (ref 0–37)
Albumin: 3.5 g/dL (ref 3.5–5.2)
Alkaline Phosphatase: 55 U/L (ref 39–117)
Anion gap: 15 (ref 5–15)
BILIRUBIN TOTAL: 2.1 mg/dL — AB (ref 0.3–1.2)
BUN: 30 mg/dL — ABNORMAL HIGH (ref 6–23)
CO2: 25 mEq/L (ref 19–32)
CREATININE: 1.79 mg/dL — AB (ref 0.50–1.35)
Calcium: 9.1 mg/dL (ref 8.4–10.5)
Chloride: 98 mEq/L (ref 96–112)
GFR calc Af Amer: 38 mL/min — ABNORMAL LOW (ref >90)
GFR calc non Af Amer: 33 mL/min — ABNORMAL LOW (ref >90)
Glucose, Bld: 122 mg/dL — ABNORMAL HIGH (ref 70–99)
Potassium: 3.4 mEq/L — ABNORMAL LOW (ref 3.7–5.3)
Sodium: 138 mEq/L (ref 137–147)
Total Protein: 6.3 g/dL (ref 6.0–8.3)

## 2014-01-27 LAB — STREP PNEUMONIAE URINARY ANTIGEN: STREP PNEUMO URINARY ANTIGEN: NEGATIVE

## 2014-01-27 LAB — CBC
HEMATOCRIT: 46.8 % (ref 39.0–52.0)
HEMOGLOBIN: 15.4 g/dL (ref 13.0–17.0)
MCH: 28.4 pg (ref 26.0–34.0)
MCHC: 32.9 g/dL (ref 30.0–36.0)
MCV: 86.2 fL (ref 78.0–100.0)
PLATELETS: 172 10*3/uL (ref 150–400)
RBC: 5.43 MIL/uL (ref 4.22–5.81)
RDW: 16.1 % — ABNORMAL HIGH (ref 11.5–15.5)
WBC: 10.2 10*3/uL (ref 4.0–10.5)

## 2014-01-27 LAB — GLUCOSE, CAPILLARY
GLUCOSE-CAPILLARY: 114 mg/dL — AB (ref 70–99)
Glucose-Capillary: 211 mg/dL — ABNORMAL HIGH (ref 70–99)

## 2014-01-27 LAB — TROPONIN I
Troponin I: <0.3 ng/mL (ref <0.30)
Troponin I: <0.3 ng/mL (ref <0.30)

## 2014-01-27 LAB — PRO B NATRIURETIC PEPTIDE: Pro B Natriuretic peptide (BNP): 6726 pg/mL — ABNORMAL HIGH (ref 0–450)

## 2014-01-27 LAB — I-STAT TROPONIN, ED: Troponin i, poc: 0.01 ng/mL (ref 0.00–0.08)

## 2014-01-27 LAB — MRSA PCR SCREENING: MRSA by PCR: NEGATIVE

## 2014-01-27 LAB — LACTIC ACID, PLASMA: Lactic Acid, Venous: 2.3 mmol/L — ABNORMAL HIGH (ref 0.5–2.2)

## 2014-01-27 MED ORDER — POTASSIUM CHLORIDE 10 MEQ/100ML IV SOLN
10.0000 meq | INTRAVENOUS | Status: AC
Start: 2014-01-27 — End: 2014-01-27
  Administered 2014-01-27 (×3): 10 meq via INTRAVENOUS
  Filled 2014-01-27 (×2): qty 100

## 2014-01-27 MED ORDER — SODIUM CHLORIDE 0.9 % IV SOLN: INTRAVENOUS | Status: DC

## 2014-01-27 MED ORDER — METOPROLOL TARTRATE 25 MG PO TABS
25.0000 mg | ORAL_TABLET | Freq: Two times a day (BID) | ORAL | Status: DC
  Administered 2014-01-27 – 2014-01-28 (×2): 25 mg via ORAL
  Filled 2014-01-27 (×4): qty 1

## 2014-01-27 MED ORDER — ROCURONIUM BROMIDE 50 MG/5ML IV SOLN
INTRAVENOUS | Status: AC
  Filled 2014-01-27: qty 2

## 2014-01-27 MED ORDER — SUCCINYLCHOLINE CHLORIDE 20 MG/ML IJ SOLN
INTRAMUSCULAR | Status: AC
  Filled 2014-01-27: qty 1

## 2014-01-27 MED ORDER — DEXTROSE 5 % IV SOLN: 500.0000 mg | INTRAVENOUS | Status: DC

## 2014-01-27 MED ORDER — DEXTROSE 5 % IV SOLN: 1.0000 g | INTRAVENOUS | Status: DC

## 2014-01-27 MED ORDER — ETOMIDATE 2 MG/ML IV SOLN
INTRAVENOUS | Status: AC
  Filled 2014-01-27: qty 20

## 2014-01-27 MED ORDER — APIXABAN 2.5 MG PO TABS
2.5000 mg | ORAL_TABLET | Freq: Two times a day (BID) | ORAL | Status: DC
  Administered 2014-01-27 – 2014-01-29 (×5): 2.5 mg via ORAL
  Filled 2014-01-27 (×7): qty 1

## 2014-01-27 MED ORDER — APIXABAN 5 MG PO TABS
5.0000 mg | ORAL_TABLET | Freq: Two times a day (BID) | ORAL | Status: DC
  Filled 2014-01-27 (×2): qty 1

## 2014-01-27 MED ORDER — PREDNISONE 5 MG PO TABS
5.0000 mg | ORAL_TABLET | Freq: Every day | ORAL | Status: DC
  Administered 2014-01-28 – 2014-02-06 (×10): 5 mg via ORAL
  Filled 2014-01-27 (×13): qty 1

## 2014-01-27 MED ORDER — DILTIAZEM HCL 100 MG IV SOLR
5.0000 mg/h | INTRAVENOUS | Status: DC
  Administered 2014-01-27: 10 mg/h via INTRAVENOUS
  Administered 2014-01-27: 5 mg/h via INTRAVENOUS
  Administered 2014-01-28: 10 mg/h via INTRAVENOUS
  Administered 2014-01-28: 5 mg/h via INTRAVENOUS
  Administered 2014-01-28: 20 mg/h via INTRAVENOUS
  Administered 2014-01-28: 19:00:00 via INTRAVENOUS
  Filled 2014-01-27 (×5): qty 100

## 2014-01-27 MED ORDER — HEPARIN SODIUM (PORCINE) 5000 UNIT/ML IJ SOLN
5000.0000 [IU] | Freq: Three times a day (TID) | INTRAMUSCULAR | Status: DC
Start: 2014-01-27 — End: 2014-01-27

## 2014-01-27 MED ORDER — DEXTROSE 5 % IV SOLN
100.0000 mg | Freq: Once | INTRAVENOUS | Status: AC
  Administered 2014-01-27: 100 mg via INTRAVENOUS
  Filled 2014-01-27: qty 10

## 2014-01-27 MED ORDER — FUROSEMIDE 10 MG/ML IJ SOLN
INTRAMUSCULAR | Status: AC
  Administered 2014-01-27: 100 mg
  Filled 2014-01-27: qty 10

## 2014-01-27 MED ORDER — METHYLPREDNISOLONE SODIUM SUCC 40 MG IJ SOLR
20.0000 mg | Freq: Every day | INTRAMUSCULAR | Status: DC
  Administered 2014-01-27: 20 mg via INTRAVENOUS
  Filled 2014-01-27: qty 0.5

## 2014-01-27 MED ORDER — LIDOCAINE HCL (CARDIAC) 20 MG/ML IV SOLN
INTRAVENOUS | Status: AC
  Filled 2014-01-27: qty 5

## 2014-01-27 MED ORDER — METOPROLOL TARTRATE 1 MG/ML IV SOLN: 5.0000 mg | Freq: Four times a day (QID) | INTRAVENOUS | Status: DC

## 2014-01-27 MED ORDER — INFLUENZA VAC SPLIT QUAD 0.5 ML IM SUSY: 0.5000 mL | PREFILLED_SYRINGE | INTRAMUSCULAR | Status: DC | PRN

## 2014-01-27 MED ORDER — DEXTROSE 5 % IV SOLN
500.0000 mg | INTRAVENOUS | Status: DC
  Administered 2014-01-27 – 2014-01-29 (×3): 500 mg via INTRAVENOUS
  Filled 2014-01-27 (×3): qty 500

## 2014-01-27 MED ORDER — ALBUTEROL SULFATE (2.5 MG/3ML) 0.083% IN NEBU
2.5000 mg | INHALATION_SOLUTION | RESPIRATORY_TRACT | Status: DC | PRN
  Administered 2014-01-30 (×3): 2.5 mg via RESPIRATORY_TRACT
  Filled 2014-01-27 (×3): qty 3

## 2014-01-27 MED ORDER — PANTOPRAZOLE SODIUM 40 MG PO TBEC
40.0000 mg | DELAYED_RELEASE_TABLET | Freq: Every day | ORAL | Status: DC
  Administered 2014-01-28 – 2014-02-05 (×9): 40 mg via ORAL
  Filled 2014-01-27 (×8): qty 1

## 2014-01-27 MED ORDER — IPRATROPIUM-ALBUTEROL 0.5-2.5 (3) MG/3ML IN SOLN
3.0000 mL | Freq: Four times a day (QID) | RESPIRATORY_TRACT | Status: DC
  Administered 2014-01-27 – 2014-01-28 (×3): 3 mL via RESPIRATORY_TRACT
  Filled 2014-01-27 (×3): qty 3

## 2014-01-27 MED ORDER — NITROGLYCERIN 2 % TD OINT
1.0000 [in_us] | TOPICAL_OINTMENT | Freq: Once | TRANSDERMAL | Status: AC
  Administered 2014-01-27: 1 [in_us] via TOPICAL
  Filled 2014-01-27: qty 1

## 2014-01-27 MED ORDER — DEXTROSE 5 % IV SOLN
1.0000 g | INTRAVENOUS | Status: DC
  Administered 2014-01-27 – 2014-01-30 (×4): 1 g via INTRAVENOUS
  Filled 2014-01-27 (×4): qty 10

## 2014-01-27 NOTE — Care Management Note (Addendum)
    Page 1 of 2   02/06/2014     4:00:14 PM CARE MANAGEMENT NOTE 02/06/2014  Patient:  Richard Chavez, Richard Chavez   Account Number:  [id number]  Date Initiated:  01/27/2014  Documentation initiated by:  Elissa Hefty  Subjective/Objective Assessment:   adm w resp distress     Action/Plan:   lives w son, pcp dr Thressa Sheller   Anticipated DC Date:  02/03/2014   Anticipated DC Plan:  [place] referral  Clinical Social Worker      DC Forensic scientist  CM consult      [facility] Choice  HOME HEALTH   Choice offered to / List presented to:  C-4 Adult Children        [place] arranged  HH-1 RN  [place] PT      [place].   Status of service:  Completed, signed off Medicare Important Message given?  YES (If response is "NO", the following Medicare IM given date fields will be blank) Date Medicare IM given:  02/03/2014 Medicare IM given by:  HUTCHINSON,CRYSTAL Date Additional Medicare IM given:  02/01/2014 Additional Medicare IM given by:  CRYSTAL HUTCHINSON  Discharge Disposition:  [place]  Per UR Regulation:  Reviewed for med. necessity/level of care/duration of stay  If discussed at [place] of Stay Meetings, dates discussed:   02/02/2014  02/04/2014    Comments:  02/06/14 10:42 CM met with pt who refuses SNF and offers choice.  Pt chooses [name].  CM later notices pt was already set up with [facility] and told CSW he wanted [facility].  CM confirmed with pt he now states it was [name].  Referral called to [facility] rep, [name] for HHPT/RN.  No other CM needs were communicated.  Mariane Masters, BSN, CM [phone].  Mariann Laster RN, BSN, MSHL, CCM  Nurse - Case Manager,  (Unit [phone]  02/03/2014 PT RECS:  SNF CM spoke with patient and son/Richard Chavez at bedside.  Son Richard Chavez lives with patient. Patient and Son agree that patient would benefit from SNF/REHAB to improve mobility and strength to improve safety and  risk of falls. SW and Dr. Grandville Silos notified.  [facility]HS/[facility]/Donna  notified of change in d/c plan. Dispo Plan:  SNF/Rehab   Crystal Hutchinson RN, BSN, MSHL, CCM  Nurse - Case Manager,  (Unit [place])  [phone]  02/01/2014 Social:  From home with supportive PCG/Son.  Ambulates with assistance and gait belt for safety.   New drop in EF this admission with increased weakness. Hx/o CIR 10/2012.  Son prefers to take patient home to provide care. Dispo Plan:  Home with HHS:  RN, PT (Disease MGMT CHF and COPD, Med mgmt, Home Safety Evaluation). ([facility]/Debbie Lovena Le notified).

## 2014-01-27 NOTE — ED Notes (Signed)
Per [facility], pt from home for resp distress. Hx of COPD, CHF, takes 140 mg lasix. Pt in afib with hx and placed on CPAP. Sats with EMS at 85 on CPAP. Given 125 mg solumedrol, 10 mg albuterol, 0.5 atrovent. 22g to LFA.

## 2014-01-27 NOTE — H&P (Signed)
PULMONARY / CRITICAL CARE MEDICINE   Name: Richard Chavez MRN: [id number] DOB: May 30, 1926    ADMISSION DATE:  01/27/2014 CONSULTATION DATE:  01/27/14  REFERRING MD :  ED Physician  CHIEF COMPLAINT:  Increasing SOB and congestion  INITIAL PRESENTATION:  80 y/o WM with PMH of CVA, GERD, CHF who presented to the [facility] via EMS with complaints of worsening SOB and congestion.  Work up concerning for RML CAP.    STUDIES:    SIGNIFICANT EVENTS: 9/23  Admit with SOB & Congestion, w/u consistent with CAP.  Required BiPAP in ER + ICU admit    HISTORY OF PRESENT ILLNESS:  Richard Chavez is an 80 y/o WM, previous smoker, with a PMH of HTN,CHF (EF 40-45% 09/2012), COPD, OSA, GERD, CVA with residual dominant hand weakness, Afib (on Eliquis), s/p pacemaker placement and CABG who presented to the [facility] ED via EMS with complaints of worsening SOB and congestion.  Patient was placed on BiPAP on presentation.  Information obtained from his son at bedside.  Son reports that his SOB and cough/congestion started acutely the morning of presentation, 9/23, and he had no previous complaints of same in the preceding days.  The cough is non-productive per son.  Richard Chavez saw his PCP, Dr. Ronne Binning at [facility],  9/15 for increasing B LE edema x 1 month. He also saw Dr. Herbie Baltimore (cardiology) who increased his lasix and metoprolol doses.  He has lost 7 lbs since the increase in lasix due to fluid loss but still feels that he is volume up from baseline (estimates baseline weight ~230 lbs)  The patient lives with his son and performs most ADL's, including driving, independently.  His son does help prepare meals as he continues to have UE weakness after his CVA.  He sleeps with the head of the bed elevated due to orthopnea.  He has OSA, but does not use CPAP.  [name] son reports pt has had  no fever or chills, no N/V/D. He does not notice his father having difficulty swallowing food or drink or coughing during/after meals.  He does  not lie down directly after meals.  He does have a history of PNA 4 times in the last year.  PAST MEDICAL HISTORY :  Past Medical History  Diagnosis Date  . CAD (coronary artery disease), native coronary artery     Status post CABG  . Hypertension   . Hyperlipidemia   . GERD (gastroesophageal reflux disease)   . COPD (chronic obstructive pulmonary disease)   . CRF (chronic renal failure)   . Barrett's esophagus   . OP (osteoporosis)   . CAD (coronary artery disease) of artery bypass graft 07/04/2011    LIMA-LAD; SVG-OM2-OM3, SVG-RCA -- all patent as of 05/27/11  . Syncope 07/04/2011  . Pacemaker 07/04/2011  . Cardiomyopathy, ischemic 07/2011; 09/2012    2D Echo - EF 35-40, mod dilated LA; Relook Echo 09/2012: EF 40-45%  . Myocardial infarction     25 yrs ago  . Peripheral vascular disease   . Arthritis   . BPH (benign prostatic hyperplasia)   . Polyp of colon     removed  . Sleep apnea     STOP BANG SCORE 4  . AV block, 3rd degree     Post St. Jude pacemaker, EF 35-40, does have intermittent atrial tachycardia, either PAt or Afib  . BPH (benign prostatic hypertrophy) 11/15/2011  . Paroxysmal atrial fibrillation - now rate controlled (formally with RVR and Hospital) 10/03/2012  .  CVA (cerebral infarction) 10/09/2012    Noted to be in atrial fibrillation with evaluation of pacemaker, prior to CVA. Right-sided MCA stroke with left arm weakness/mild paralysis -- notably improved    Past Surgical History  Procedure Laterality Date  . Pacemaker placement  2007  . Cholecystectomy  2005  . Coronary artery bypass graft  1978    X3  . Lesion excision      from penis  . Cardiac catheterization  06/06/2011    occluded proximal RCA, ostial LAD and mid Circumflex after OM 1.; [place] LIMA-LAD, patent SVG-OM1-OM 2, patent SVG-RCA. Reduced ejection fraction of 30-35%.  . Cardiac catheterization  06/14/2010    LIMA to LAD, SVG to RCA, SVG to OM1 Circumflex, 100% occluded RCA, 100% occluded circumfkex,  100% occluded LAD, no evidence of graft dysfunction, continue medical therapy  . Cardiac catheterization  04/12/2003    3 vessel coronary artery disease, continue medical treatment  . Cardiac catheterization  10/07/2000    Severe 3 vessel coronary artery disease, continue medical treatment  . Doppler echocardiography  10/02/2012    EF 40-45%; possible grade 1 diastolic dysfunction, but A. fib precludes this. Mild atrial dilatation bilaterally. Moderately elevated pulmonary pressures of 40 mmHg.   Prior to Admission medications   Medication Sig Start Date End Date Taking? Authorizing Provider  albuterol-ipratropium (COMBIVENT) 18-103 MCG/ACT inhaler Inhale 2 puffs into the lungs 2 (two) times daily.     Historical Provider, MD  apixaban (ELIQUIS) 5 MG TABS tablet Take 1 tablet (5 mg total) by mouth 2 (two) times daily. 11/26/13   Marykay Lex, MD  atorvastatin (LIPITOR) 20 MG tablet Take 20 mg by mouth daily with breakfast.     Historical Provider, MD  fluticasone (FLONASE) 50 MCG/ACT nasal spray Place 1 spray into both nostrils 2 (two) times daily as needed for allergies or rhinitis.    Historical Provider, MD  Fluticasone-Salmeterol (ADVAIR DISKUS) 500-50 MCG/DOSE AEPB Inhale 1 puff into the lungs every 12 (twelve) hours.     Historical Provider, MD  gabapentin (NEURONTIN) 300 MG capsule Take 600 mg by mouth 3 (three) times daily.  02/07/11   Historical Provider, MD  guaiFENesin (MUCINEX) 600 MG 12 hr tablet Take 1,200 mg by mouth 2 (two) times daily.    Historical Provider, MD  loratadine (CLARITIN) 10 MG tablet Take 10 mg by mouth daily with breakfast.     Historical Provider, MD  Magnesium Chloride (SLOW-MAG) 535 (64 MG) MG TBCR Take 1 tablet by mouth 2 (two) times daily.     Historical Provider, MD  metoprolol (LOPRESSOR) 25 MG tablet Take 1 tablet (25 mg total) by mouth 2 (two) times daily. 04/03/13   Joseph Art, DO  Omega-3 Fatty Acids (FISH OIL) 1000 MG CAPS Take 1 capsule by mouth  daily.     Historical Provider, MD  potassium chloride (K-DUR,KLOR-CON) 10 MEQ tablet Take 10 mEq by mouth 2 (two) times daily.    Historical Provider, MD  predniSONE (DELTASONE) 5 MG tablet Take 5 mg by mouth daily.    Historical Provider, MD  ranitidine (ZANTAC) 150 MG capsule Take 150 mg by mouth 2 (two) times daily.     Historical Provider, MD  tiotropium (SPIRIVA) 18 MCG inhalation capsule Place 18 mcg into inhaler and inhale daily with breakfast.     Historical Provider, MD  Vitamin D, Ergocalciferol, (DRISDOL) 50000 UNITS CAPS Take 50,000 Units by mouth every 7 (seven) days. Take on Fridays    Historical  Provider, MD  vitamin E 1000 UNIT capsule Take 1,000 Units by mouth daily with breakfast.     Historical Provider, MD   No Known Allergies  FAMILY HISTORY:  Family History  Problem Relation Age of Onset  . Leukemia Father   . Heart disease Father   . Alzheimer's disease Mother   . Arthritis Mother   . Lung cancer Son 76  . Alzheimer's disease Maternal Grandmother    SOCIAL HISTORY:  reports that he quit smoking about 28 years ago. His smoking use included Cigarettes. He has a 4.5 pack-year smoking history. He has never used smokeless tobacco. He reports that he does not drink alcohol or use illicit drugs.  REVIEW OF SYSTEMS:   Unable to complete as pt is on BiPAP due to respiratory failure  SUBJECTIVE:   VITAL SIGNS: Pulse Rate:  [27-127] 57 (09/23 1100) Resp:  [20-35] 22 (09/23 1100) BP: (114-157)/(78-111) 134/78 mmHg (09/23 1100) SpO2:  [85 %-100 %] 100 % (09/23 1100) FiO2 (%):  [60 %-100 %] 60 % (09/23 1200) Weight:  [243 lb 13.3 oz (110.6 kg)] 243 lb 13.3 oz (110.6 kg) (09/23 1100)  HEMODYNAMICS:    VENTILATOR SETTINGS: Vent Mode:  [-] BIPAP FiO2 (%):  [60 %-100 %] 60 % PEEP:  [5 cmH20] 5 cmH20  INTAKE / OUTPUT:  Intake/Output Summary (Last 24 hours) at 01/27/14 1259 Last data filed at 01/27/14 1200  Gross per 24 hour  Intake  61.17 ml  Output      0 ml   Net  61.17 ml    PHYSICAL EXAMINATION: General:  Obese, ill appearing male lying in bed with BiPAP Neuro:  Alert and oriented, able to follow commands HEENT:  Normocephalic, short, thick neck, poor dentition, dry mucous membranes Cardiovascular:  Unable to auscultate due to adventitious breath sounds Lungs:  Coarse BS B, scattered rhonchi, diminished R Abdomen:  Soft, non-tender Musculoskeletal:  B LE +2-3 pitting edema Skin: warm, intact, abrasion noted L cheek  LABS:  CBC  Recent Labs Lab 01/27/14 0929  WBC 10.2  HGB 15.4  HCT 46.8  PLT 172   Coag's No results found for this basename: APTT, INR,  in the last 168 hours  BMET No results found for this basename: NA, K, CL, CO2, BUN, CREATININE, GLUCOSE,  in the last 168 hours  Electrolytes No results found for this basename: CALCIUM, MG, PHOS,  in the last 168 hours  Sepsis Markers  Recent Labs Lab 01/27/14 0929  LATICACIDVEN 2.3*   ABG  Recent Labs Lab 01/27/14 1125  PHART 7.394  PCO2ART 48.5*  PO2ART 314.0*   Liver Enzymes No results found for this basename: AST, ALT, ALKPHOS, BILITOT, ALBUMIN,  in the last 168 hours  Cardiac Enzymes  Recent Labs Lab 01/27/14 0929  PROBNP 6726.0*   Glucose  Recent Labs Lab 01/27/14 1058  GLUCAP 114*    Imaging No results found.   ASSESSMENT / PLAN:  PULMONARY OETT n/a BiPAP 9/23>> A: Acute Hypoxic Respiratory failure RUL PNA - CAP COPD OSA - does not wear CPAP P:   BiPAP PRN for increased WOB, desaturations Empiric abx for CAP, see below Continue bronchodilators Continue baseline steroids 5 mg QD Oxygen to support saturations 90-95% Pulmonary hygiene Will need SLP evaluation given hx of recurrent PNA's and CVA hx +GERD Trend CXR  Duonebs Hold home advair, claritin, mucinex, spiriva  CARDIOVASCULAR CVL n/a A:  Afib with RVR - in setting of decompensated CHF + PNA, on baseline verapamil +  metoprolol Acute on Chronic CHF - EF 40-45%  09/2012, recent lasix adjustment by Dr. Herbie Baltimore.   HTN  Hx CAD P:  Lasix 100 mg per EDP Continue reduced dose metoprolol  Cardizem gtt, may be able to transition back to home regimen in am 9/24  Trend troponin, EKG Repeat ECHO Monitor I/O's  RENAL A:  Chronic renal failure (baseline Cr 1.2-1.6)  P:   Correct electrolytes as necessary Await CMP   GASTROINTESTINAL A:  GERD Hx Barrett's esophagus Concern for Recurrent Aspiration  P:   NPO while on BiPAP Once off bipap, NPO x meds until SLP evaluation Protonix  HEMATOLOGIC A:  Anticoagulated for Afib Anemia  P:  Continue Eliquis Monitor CBC Tx for Hgb <7%, active bleeding or MI <8%  INFECTIOUS A:  CAP - RML infiltrate, recurrent PNA's P:   BCx2 9/23>> UC 9/23>> Sputum 9/23 >> Abx: Azithromycin, start date 9/23, day 1/x  Ceftriaxone, start date 9/23, day 1/x   ENDOCRINE A:  Chronic Prednisone Dependence -  QD for COPD  P:   Monitor glucose trend on BMP  NEUROLOGIC A:  Hx CVA - residual weakness to UE Chronic LE pain P:   RASS goal: n/a Hold home Neurontin   GLOBAL Discussed goals of care with son.  Agreeable to short term intubation, but would not want long term support.  TODAY'S SUMMARY:  80 yo M presents to ED for increasing SOB and cough/congestion, placed on BiPAP, admitted to ICU with CAP.  Lab & culture data pending.    Beckey Rutter, PA-S  [facility]  Canary Brim, NP-C [place] [facility] Pgr: [phone] or [phone]   I have personally obtained a history, examined the patient, evaluated laboratory and imaging results, formulated the assessment and plan and placed orders.  CRITICAL CARE: The patient is critically ill with multiple organ systems failure and requires high complexity decision making for assessment and support, frequent evaluation and titration of therapies, application of advanced monitoring technologies and extensive interpretation of multiple databases.  Critical Care Time devoted to patient care services described in this note is 45 minutes.   Alyson Reedy, M.D. [facility] Pulmonary/Critical Care Medicine. Pager: [phone]. After hours pager: [phone].  01/27/2014, 12:59 PM

## 2014-01-27 NOTE — ED Provider Notes (Signed)
CSN: [id number]     Arrival date & time 01/27/14  0827 History   First MD Initiated Contact with Patient 01/27/14 [phone]     Chief Complaint  Patient presents with  . Respiratory Distress      HPI Per [facility], pt from home for resp distress. Hx of COPD, CHF, takes 140 mg lasix. Pt in afib with hx and placed on CPAP. Sats with EMS at 85 on CPAP. Given 125 mg solumedrol, 10 mg albuterol, 0.5 atrovent  Past Medical History  Diagnosis Date  . CAD (coronary artery disease), native coronary artery     Status post CABG  . Hypertension   . Hyperlipidemia   . GERD (gastroesophageal reflux disease)   . COPD (chronic obstructive pulmonary disease)   . CRF (chronic renal failure)   . Barrett's esophagus   . OP (osteoporosis)   . CAD (coronary artery disease) of artery bypass graft 07/04/2011    LIMA-LAD; SVG-OM2-OM3, SVG-RCA -- all patent as of 05/27/11  . Syncope 07/04/2011  . Pacemaker 07/04/2011  . Cardiomyopathy, ischemic 07/2011; 09/2012    2D Echo - EF 35-40, mod dilated LA; Relook Echo 09/2012: EF 40-45%  . Myocardial infarction     25 yrs ago  . Peripheral vascular disease   . Arthritis   . BPH (benign prostatic hyperplasia)   . Polyp of colon     removed  . Sleep apnea     STOP BANG SCORE 4  . AV block, 3rd degree     Post St. Jude pacemaker, EF 35-40, does have intermittent atrial tachycardia, either PAt or Afib  . BPH (benign prostatic hypertrophy) 11/15/2011  . Paroxysmal atrial fibrillation - now rate controlled ([facility]ally with [facility]) 10/03/2012  . CVA (cerebral infarction) 10/09/2012    Noted to be in atrial fibrillation with evaluation of pacemaker, prior to CVA. Right-sided MCA stroke with left arm weakness/mild paralysis -- notably improved    Past Surgical History  Procedure Laterality Date  . Pacemaker placement  2007  . Cholecystectomy  2005  . Coronary artery bypass graft  1978    X3  . Lesion excision      from penis  . Cardiac catheterization  06/06/2011   occluded proximal RCA, ostial LAD and mid Circumflex after OM 1.; [place] LIMA-LAD, patent SVG-OM1-OM 2, patent SVG-RCA. Reduced ejection fraction of 30-35%.  . Cardiac catheterization  06/14/2010    LIMA to LAD, SVG to RCA, SVG to OM1 Circumflex, 100% occluded RCA, 100% occluded circumfkex, 100% occluded LAD, no evidence of graft dysfunction, continue medical therapy  . Cardiac catheterization  04/12/2003    3 vessel coronary artery disease, continue medical treatment  . Cardiac catheterization  10/07/2000    Severe 3 vessel coronary artery disease, continue medical treatment  . Doppler echocardiography  10/02/2012    EF 40-45%; possible grade 1 diastolic dysfunction, but A. fib precludes this. Mild atrial dilatation bilaterally. Moderately elevated pulmonary pressures of 40 mmHg.   Family History  Problem Relation Age of Onset  . Leukemia Father   . Heart disease Father   . Alzheimer's disease Mother   . Arthritis Mother   . Lung cancer Son 9  . Alzheimer's disease Maternal Grandmother    History  Substance Use Topics  . Smoking status: Former Smoker -- 0.30 packs/day for 15 years    Types: Cigarettes    Quit date: 05/07/1985  . Smokeless tobacco: Never Used  . Alcohol Use: No    Review of  Systems  Unable to perform ROS: Acuity of condition      Allergies  Review of patient's allergies indicates no known allergies.  Home Medications   Prior to Admission medications   Medication Sig Start Date End Date Taking? Authorizing Provider  albuterol-ipratropium (COMBIVENT) 18-103 MCG/ACT inhaler Inhale 2 puffs into the lungs 2 (two) times daily.    Yes Historical Provider, MD  apixaban (ELIQUIS) 5 MG TABS tablet Take 1 tablet (5 mg total) by mouth 2 (two) times daily. 11/26/13  Yes Marykay Lex, MD  atorvastatin (LIPITOR) 20 MG tablet Take 20 mg by mouth daily with breakfast.    Yes Historical Provider, MD  fluticasone (FLONASE) 50 MCG/ACT nasal spray Place 1 spray into both  nostrils 2 (two) times daily as needed for allergies or rhinitis.   Yes Historical Provider, MD  Fluticasone-Salmeterol (ADVAIR DISKUS) 500-50 MCG/DOSE AEPB Inhale 1 puff into the lungs every 12 (twelve) hours.    Yes Historical Provider, MD  furosemide (LASIX) 40 MG tablet Take 60 mg by mouth 2 (two) times daily.   Yes Historical Provider, MD  gabapentin (NEURONTIN) 300 MG capsule Take 600 mg by mouth 3 (three) times daily.  02/07/11  Yes Historical Provider, MD  guaiFENesin (MUCINEX) 600 MG 12 hr tablet Take 1,200 mg by mouth 2 (two) times daily.   Yes Historical Provider, MD  loratadine (CLARITIN) 10 MG tablet Take 10 mg by mouth daily with breakfast.    Yes Historical Provider, MD  Magnesium Chloride (SLOW-MAG) 535 (64 MG) MG TBCR Take 1 tablet by mouth 2 (two) times daily.    Yes Historical Provider, MD  metoprolol (LOPRESSOR) 100 MG tablet Take 100 mg by mouth 2 (two) times daily.   Yes Historical Provider, MD  Omega-3 Fatty Acids (FISH OIL) 1000 MG CAPS Take 1,000 mg by mouth daily.    Yes Historical Provider, MD  potassium chloride (K-DUR,KLOR-CON) 10 MEQ tablet Take 10 mEq by mouth 2 (two) times daily.   Yes Historical Provider, MD  predniSONE (DELTASONE) 5 MG tablet Take 5 mg by mouth daily.   Yes Historical Provider, MD  ranitidine (ZANTAC) 150 MG capsule Take 150 mg by mouth 2 (two) times daily.    Yes Historical Provider, MD  tiotropium (SPIRIVA) 18 MCG inhalation capsule Place 18 mcg into inhaler and inhale daily with breakfast.    Yes Historical Provider, MD  verapamil (CALAN-SR) 180 MG CR tablet Take 180 mg by mouth every morning.   Yes Historical Provider, MD  Vitamin D, Ergocalciferol, (DRISDOL) 50000 UNITS CAPS Take 50,000 Units by mouth every 7 (seven) days. Take on Fridays   Yes Historical Provider, MD  vitamin E 1000 UNIT capsule Take 1,000 Units by mouth daily with breakfast.    Yes Historical Provider, MD   BP 105/50  Pulse 94  Temp(Src) 98.4 F (36.9 C) (Oral)  Resp 22   Ht 6\' 7"  (2.007 m)  Wt 243 lb 13.3 oz (110.6 kg)  BMI 27.46 kg/m2  SpO2 99% Physical Exam  Nursing note and vitals reviewed. Constitutional: He appears well-developed and well-nourished. He appears ill. He appears distressed.  HENT:  Head: Normocephalic and atraumatic.  Eyes: Pupils are equal, round, and reactive to light.  Neck: Normal range of motion.  Cardiovascular: Tachycardia present.  Exam reveals gallop.   Pulmonary/Chest: He is in respiratory distress. He has wheezes. He has rales.  Abdominal: Normal appearance. He exhibits no distension. There is no tenderness. There is no rebound.  Musculoskeletal: Normal range  of motion. He exhibits edema.  Neurological: He is alert. No cranial nerve deficit.  Skin: Skin is warm and dry. No rash noted.    ED Course  Procedures (including critical care time)  Medications  furosemide (LASIX) 100 mg in dextrose 5 % 50 mL IVPB (100 mg Intravenous Transfusing/Transfer 01/27/14 1031)          heparin injection 5,000 Units (not administered)  cefTRIAXone (ROCEPHIN) 1 g in dextrose 5 % 50 mL IVPB (not administered)  azithromycin (ZITHROMAX) 500 mg in dextrose 5 % 250 mL IVPB (not administered)  nitroGLYCERIN (NITROGLYN) 2 % ointment 1 inch (1 inch Topical Given 01/27/14 0840)  furosemide (LASIX) 10 MG/ML injection (100 mg  Given 01/27/14 0840)    CRITICAL CARE Performed by: Nelva Nay L Total critical care time: 45 min Critical care time was exclusive of separately billable procedures and treating other patients. Critical care was necessary to treat or prevent imminent or life-threatening deterioration. Critical care was time spent personally by me on the following activities: development of treatment plan with patient and/or surrogate as well as nursing, discussions with consultants, evaluation of patient's response to treatment, examination of patient, obtaining history from patient or surrogate, ordering and performing treatments and  interventions, ordering and review of laboratory studies, ordering and review of radiographic studies, pulse oximetry and re-evaluation of patient's condition.  Labs Review Labs Reviewed  CBC - Abnormal; Notable for the following:    RDW 16.1 (*)    All other components within normal limits  PRO B NATRIURETIC PEPTIDE - Abnormal; Notable for the following:    Pro B Natriuretic peptide (BNP) 6726.0 (*)    All other components within normal limits  LACTIC ACID, PLASMA - Abnormal; Notable for the following:    Lactic Acid, Venous 2.3 (*)    All other components within normal limits  COMPREHENSIVE METABOLIC PANEL - Abnormal; Notable for the following:    Potassium 3.4 (*)    Glucose, Bld 122 (*)    BUN 30 (*)    Creatinine, Ser 1.79 (*)    Total Bilirubin 2.1 (*)    GFR calc non Af Amer 33 (*)    GFR calc Af Amer 38 (*)    All other components within normal limits  GLUCOSE, CAPILLARY - Abnormal; Notable for the following:    Glucose-Capillary 114 (*)    All other components within normal limits  GLUCOSE, CAPILLARY - Abnormal; Notable for the following:    Glucose-Capillary 211 (*)    All other components within normal limits  POCT I-STAT 3, ART BLOOD GAS (G3+) - Abnormal; Notable for the following:    pCO2 arterial 48.5 (*)    pO2, Arterial 314.0 (*)    Bicarbonate 29.6 (*)    Acid-Base Excess 4.0 (*)    All other components within normal limits  MRSA PCR SCREENING  URINE CULTURE  CULTURE, BLOOD (ROUTINE X 2)  CULTURE, BLOOD (ROUTINE X 2)  CULTURE, EXPECTORATED SPUTUM-ASSESSMENT  GRAM STAIN  URINALYSIS, ROUTINE W REFLEX MICROSCOPIC  STREP PNEUMONIAE URINARY ANTIGEN  TROPONIN I  TROPONIN I  LEGIONELLA ANTIGEN, URINE  BASIC METABOLIC PANEL  CBC  I-STAT TROPOININ, ED    Imaging Review Dg Chest Portable 1 View  01/27/2014   CLINICAL DATA:  History of CVA  EXAM: PORTABLE CHEST - 1 VIEW  COMPARISON:  04/01/2013  FINDINGS: Cardiomegaly is noted. Dual lead cardiac pacemaker is  unchanged in position. Status post median sternotomy. There is infiltrate/ consolidation in right upper lobe  highly suspicious for pneumonia. Follow-up to resolution is recommended. No pulmonary edema.  IMPRESSION: Cardiomegaly. Dual lead cardiac pacemaker in place. Infiltrate/ consolidation in right upper lobe highly suspicious for pneumonia. Follow-up to resolution is recommended.   Electronically Signed   By: Natasha Mead M.D.   On: 01/27/2014 08:59     EKG Interpretation   Date/Time:  Wednesday January 27 2014 08:44:50 EDT Ventricular Rate:  129 PR Interval:  138 QRS Duration: 101 QT Interval:  311 QTC Calculation: 456 R Axis:   100 Text Interpretation:  Ectopic atrial tachycardia, unifocal Ventricular  premature complex Right axis deviation Borderline low voltage, extremity  leads Repolarization abnormality, prob rate related Baseline wander in  lead(s) V6 Abnormal ekg Confirmed by Radford Pax  MD, Warda Mcqueary ([id number]) on  01/27/2014 9:15:49 AM     Critical care medicine consulted.  Patient was admitted to their service.  At the time of admission patient was resting much more comfortably with CPAP on and his O2 sats had risen to the low 90s his respiratory rate had decreased and he was much less agitated. MDM   Final diagnoses:  Right upper lobe pneumonia  Aspiration pneumonia, unspecified aspiration pneumonia type  Other emphysema  Acute respiratory failure with hypoxia        Nelia Shi, MD 01/27/14 2041

## 2014-01-27 NOTE — ED Notes (Signed)
Verbal order given by dr. Radford Pax for a foley cath.

## 2014-01-27 NOTE — Progress Notes (Signed)
  Echocardiogram 2D Echocardiogram has been performed.  Leta Jungling M 01/27/2014, 3:23 PM

## 2014-01-28 ENCOUNTER — Inpatient Hospital Stay (HOSPITAL_COMMUNITY): Payer: Medicare Other

## 2014-01-28 DIAGNOSIS — N183 Chronic kidney disease, stage 3 unspecified: Secondary | ICD-10-CM

## 2014-01-28 DIAGNOSIS — Z950 Presence of cardiac pacemaker: Secondary | ICD-10-CM

## 2014-01-28 DIAGNOSIS — I5042 Chronic combined systolic (congestive) and diastolic (congestive) heart failure: Secondary | ICD-10-CM

## 2014-01-28 DIAGNOSIS — E669 Obesity, unspecified: Secondary | ICD-10-CM

## 2014-01-28 DIAGNOSIS — I2589 Other forms of chronic ischemic heart disease: Secondary | ICD-10-CM

## 2014-01-28 DIAGNOSIS — I509 Heart failure, unspecified: Secondary | ICD-10-CM

## 2014-01-28 DIAGNOSIS — J441 Chronic obstructive pulmonary disease with (acute) exacerbation: Secondary | ICD-10-CM

## 2014-01-28 DIAGNOSIS — I4891 Unspecified atrial fibrillation: Secondary | ICD-10-CM | POA: Diagnosis present

## 2014-01-28 DIAGNOSIS — G4733 Obstructive sleep apnea (adult) (pediatric): Secondary | ICD-10-CM

## 2014-01-28 DIAGNOSIS — I25810 Atherosclerosis of coronary artery bypass graft(s) without angina pectoris: Secondary | ICD-10-CM

## 2014-01-28 DIAGNOSIS — I5023 Acute on chronic systolic (congestive) heart failure: Secondary | ICD-10-CM

## 2014-01-28 LAB — BASIC METABOLIC PANEL
Anion gap: 15 (ref 5–15)
BUN: 28 mg/dL — ABNORMAL HIGH (ref 6–23)
CALCIUM: 8.3 mg/dL — AB (ref 8.4–10.5)
CO2: 25 meq/L (ref 19–32)
Chloride: 99 mEq/L (ref 96–112)
Creatinine, Ser: 1.56 mg/dL — ABNORMAL HIGH (ref 0.50–1.35)
GFR calc Af Amer: 45 mL/min — ABNORMAL LOW (ref >90)
GFR calc non Af Amer: 39 mL/min — ABNORMAL LOW (ref >90)
GLUCOSE: 156 mg/dL — AB (ref 70–99)
Potassium: 3.5 mEq/L — ABNORMAL LOW (ref 3.7–5.3)
SODIUM: 139 meq/L (ref 137–147)

## 2014-01-28 LAB — CBC
HEMATOCRIT: 40.9 % (ref 39.0–52.0)
Hemoglobin: 13.6 g/dL (ref 13.0–17.0)
MCH: 28.3 pg (ref 26.0–34.0)
MCHC: 33.3 g/dL (ref 30.0–36.0)
MCV: 85.2 fL (ref 78.0–100.0)
Platelets: 149 10*3/uL — ABNORMAL LOW (ref 150–400)
RBC: 4.8 MIL/uL (ref 4.22–5.81)
RDW: 15.9 % — AB (ref 11.5–15.5)
WBC: 17.6 10*3/uL — ABNORMAL HIGH (ref 4.0–10.5)

## 2014-01-28 LAB — URINE CULTURE
COLONY COUNT: NO GROWTH
Culture: NO GROWTH

## 2014-01-28 LAB — LEGIONELLA ANTIGEN, URINE: LEGIONELLA ANTIGEN, URINE: NEGATIVE

## 2014-01-28 MED ORDER — DOCUSATE SODIUM 100 MG PO CAPS
100.0000 mg | ORAL_CAPSULE | Freq: Two times a day (BID) | ORAL | Status: DC
  Administered 2014-01-28 – 2014-02-06 (×16): 100 mg via ORAL
  Filled 2014-01-28 (×20): qty 1

## 2014-01-28 MED ORDER — TIOTROPIUM BROMIDE MONOHYDRATE 18 MCG IN CAPS
18.0000 ug | ORAL_CAPSULE | Freq: Every day | RESPIRATORY_TRACT | Status: DC
  Administered 2014-01-30 – 2014-02-06 (×8): 18 ug via RESPIRATORY_TRACT
  Filled 2014-01-28 (×2): qty 5

## 2014-01-28 MED ORDER — FUROSEMIDE 10 MG/ML IJ SOLN
40.0000 mg | Freq: Once | INTRAMUSCULAR | Status: AC
  Administered 2014-01-28: 40 mg via INTRAVENOUS
  Filled 2014-01-28: qty 4

## 2014-01-28 MED ORDER — MOMETASONE FURO-FORMOTEROL FUM 100-5 MCG/ACT IN AERO
2.0000 | INHALATION_SPRAY | Freq: Two times a day (BID) | RESPIRATORY_TRACT | Status: DC
  Filled 2014-01-28: qty 8.8

## 2014-01-28 MED ORDER — DILTIAZEM HCL 25 MG/5ML IV SOLN
15.0000 mg | Freq: Once | INTRAVENOUS | Status: AC
  Filled 2014-01-28 (×2): qty 5

## 2014-01-28 MED ORDER — MOMETASONE FURO-FORMOTEROL FUM 100-5 MCG/ACT IN AERO
2.0000 | INHALATION_SPRAY | Freq: Two times a day (BID) | RESPIRATORY_TRACT | Status: DC
  Administered 2014-01-28 – 2014-01-30 (×3): 2 via RESPIRATORY_TRACT

## 2014-01-28 MED ORDER — METOPROLOL TARTRATE 100 MG PO TABS
100.0000 mg | ORAL_TABLET | Freq: Two times a day (BID) | ORAL | Status: DC
  Administered 2014-01-28 – 2014-01-30 (×4): 100 mg via ORAL
  Filled 2014-01-28 (×5): qty 1

## 2014-01-28 MED ORDER — TIOTROPIUM BROMIDE MONOHYDRATE 18 MCG IN CAPS
18.0000 ug | ORAL_CAPSULE | Freq: Every day | RESPIRATORY_TRACT | Status: DC
  Filled 2014-01-28: qty 5

## 2014-01-28 MED ORDER — POTASSIUM CHLORIDE CRYS ER 20 MEQ PO TBCR
20.0000 meq | EXTENDED_RELEASE_TABLET | Freq: Once | ORAL | Status: AC
  Administered 2014-01-28: 20 meq via ORAL
  Filled 2014-01-28: qty 1

## 2014-01-28 MED ORDER — POTASSIUM CHLORIDE 10 MEQ/100ML IV SOLN
10.0000 meq | INTRAVENOUS | Status: AC
  Administered 2014-01-28 (×2): 10 meq via INTRAVENOUS
  Filled 2014-01-28: qty 100

## 2014-01-28 NOTE — Procedures (Signed)
Objective Swallowing Evaluation: Fiberoptic Endoscopic Evaluation of Swallowing  Patient Details  Name: Richard Chavez MRN: [id number] Date of Birth: 1926-06-12  Today's Date: 01/28/2014 Time: 6389-3734 SLP Time Calculation (min): 27 min  Past Medical History:  Past Medical History  Diagnosis Date  . CAD (coronary artery disease), native coronary artery     Status post CABG  . Hypertension   . Hyperlipidemia   . GERD (gastroesophageal reflux disease)   . COPD (chronic obstructive pulmonary disease)   . CRF (chronic renal failure)   . Barrett's esophagus   . OP (osteoporosis)   . CAD (coronary artery disease) of artery bypass graft 07/04/2011    LIMA-LAD; SVG-OM2-OM3, SVG-RCA -- all patent as of 05/27/11  . Syncope 07/04/2011  . Pacemaker 07/04/2011  . Cardiomyopathy, ischemic 07/2011; 09/2012    2D Echo - EF 35-40, mod dilated LA; Relook Echo 09/2012: EF 40-45%  . Myocardial infarction     25 yrs ago  . Peripheral vascular disease   . Arthritis   . BPH (benign prostatic hyperplasia)   . Polyp of colon     removed  . Sleep apnea     STOP BANG SCORE 4  . AV block, 3rd degree     Post St. Jude pacemaker, EF 35-40, does have intermittent atrial tachycardia, either PAt or Afib  . BPH (benign prostatic hypertrophy) 11/15/2011  . Paroxysmal atrial fibrillation - now rate controlled ([facility] with [facility]) 10/03/2012  . CVA (cerebral infarction) 10/09/2012    Noted to be in atrial fibrillation with evaluation of pacemaker, prior to CVA. Right-sided MCA stroke with left arm weakness/mild paralysis -- notably improved    Past Surgical History:  Past Surgical History  Procedure Laterality Date  . Pacemaker placement  2007  . Cholecystectomy  2005  . Coronary artery bypass graft  1978    X3  . Lesion excision      from penis  . Cardiac catheterization  06/06/2011    occluded proximal RCA, ostial LAD and mid Circumflex after OM 1.; [place] LIMA-LAD, patent SVG-OM1-OM 2, patent  SVG-RCA. Reduced ejection fraction of 30-35%.  . Cardiac catheterization  06/14/2010    LIMA to LAD, SVG to RCA, SVG to OM1 Circumflex, 100% occluded RCA, 100% occluded circumfkex, 100% occluded LAD, no evidence of graft dysfunction, continue medical therapy  . Cardiac catheterization  04/12/2003    3 vessel coronary artery disease, continue medical treatment  . Cardiac catheterization  10/07/2000    Severe 3 vessel coronary artery disease, continue medical treatment  . Doppler echocardiography  10/02/2012    EF 40-45%; possible grade 1 diastolic dysfunction, but A. fib precludes this. Mild atrial dilatation bilaterally. Moderately elevated pulmonary pressures of 40 mmHg.   HPI:  80 y/o WM with PMH of right  MCA CVA, GERD, CHF, Barretts esophagus, COPD, recurrent PNA,  who presented to the [facility] via EMS with complaints of worsening SOB and congestion. Work up concerning for RUL CAP, concern for recurrent aspiration. Required BiPAP in ER.      Assessment / Plan / Recommendation Clinical Impression  Dysphagia Diagnosis: Mild pharyngeal phase dysphagia Clinical impression: Patient presents with a mild pharyngeal dysphagia with a delay in swallow initiation, likely a result of sensory impairements and decreased apneic period with h/o GER and COPD. Delayed swallow initiation results in aspiration of thin liquids with cough response, prevented with SLP cues for small single cup sips. Although no backflow from esophagus noted during today's exam, cannot r/o esisodic  prandial aspiration given h/o significant esoophageal deficits. SLP will initiate diet and f/u with patient for carryout and follow through of compensatory strategies. Education complete with patient following exam.     Treatment Recommendation  Therapy as outlined in treatment plan below    Diet Recommendation Regular;Thin liquid   Liquid Administration via: Cup;No straw Medication Administration: Whole meds with puree Supervision: Patient  able to self feed;Full supervision/cueing for compensatory strategies Compensations: Slow rate;Small sips/bites Postural Changes and/or Swallow Maneuvers: Seated upright 90 degrees;Upright 30-60 min after meal    Other  Recommendations Oral Care Recommendations: Oral care BID   Follow Up Recommendations  None    Frequency and Duration min 1 x/week  1 week           General HPI: 80 y/o WM with PMH of right  MCA CVA, GERD, CHF, Barretts esophagus, COPD, recurrent PNA,  who presented to the [facility] via EMS with complaints of worsening SOB and congestion. Work up concerning for RUL CAP, concern for recurrent aspiration. Required BiPAP in ER.  Type of Study: Fiberoptic Endoscopic Evaluation of Swallowing Reason for Referral: Objectively evaluate swallowing function Previous Swallow Assessment: none Diet Prior to this Study: NPO Temperature Spikes Noted: No Respiratory Status: Nasal cannula History of Recent Intubation: No Behavior/Cognition: Alert;Cooperative;Pleasant mood Oral Cavity - Dentition: Missing dentition;Edentulous Oral Motor / Sensory Function: Within functional limits Self-Feeding Abilities: Able to feed self Patient Positioning: Upright in chair Baseline Vocal Quality: Clear Volitional Cough: Strong Volitional Swallow: Able to elicit Anatomy:  (laryngeal redness) Pharyngeal Secretions: Not observed secondary MBS    Reason for Referral Objectively evaluate swallowing function   Oral Phase Oral Preparation/Oral Phase Oral Phase: WFL   Pharyngeal Phase Pharyngeal Phase Pharyngeal Phase: Impaired Pharyngeal - Thin Pharyngeal - Thin Cup: Delayed swallow initiation;Premature spillage to valleculae Pharyngeal - Thin Straw: Delayed swallow initiation;Premature spillage to pyriform sinuses;Penetration/Aspiration before swallow;Moderate aspiration Penetration/Aspiration details (thin straw): Material enters airway, passes BELOW cords and not ejected out despite cough attempt  by patient  Cervical Esophageal Phase    GO    Cervical Esophageal Phase Cervical Esophageal Phase:  (no notable backflow from esophagus into pharynx noted)        Ferdinand Lango MA, CCC-SLP [phone]  Kristel Durkee Meryl 01/28/2014, 3:20 PM

## 2014-01-28 NOTE — Progress Notes (Signed)
Asked by  Dr. Kendrick Fries about patient's ability to consume po potassium due to intolerance via IV. Although recommendations are for NPO until FEES can be completed this pm, feel that patient is safe to consume meds crushed in puree if needed prior. Discussed with RN and MD.   Ferdinand Lango MA, CCC-SLP [phone]

## 2014-01-28 NOTE — Consult Note (Signed)
Reason for Consult: Afib w/ RVR Referring Physician: Critical Care Primary Cardiologist: Dr. Ellyn Hack and Dr. Sallyanne Kuster   HPI: The patient is a 80 y/o male, admitted to [facility] on 01/27/14 by Critical Care for CAP and acute CHF and was noted to be in atrial fibrillation with RVR.   He is followed by Dr. Ellyn Hack and has a significant cardiac history. He has a h/o CAD with MI 25 years ago, s/p CABG x 4 with LIMA-LAD; SVG-OM2-OM3, SVG-RCA -- all patent as of 05/27/11, ischemic cardiomyopathy with a past EF of 35-40%, with improvement to 40-45% by 2D echo 09/2012. He has a pacemaker, followed by Dr. Sallyanne Kuster. He also has a past h/o PAF, on Eliquis for anticoagulation, and has HTN and renal insufficiency with baseline Scr of 1.8. In 2014, he suffered a right sided stroke with residual left hand weakness.   On arrival to the ED yesterday, CXR revealed cardiomegaly and Infiltrate/ consolidation in right upper lobe highly suspicious for pneumonia. WBC was WNL on admit but now elevated at 17.6. BNP was elevated at 6726. Troponin negative x 3. He was hypokalemic with K at 3.4. Scr 1.79. EKG demonstrated Afib with ventricular response in the 130s.  He was started on BiPap therapy, antibiotics and admitted to the ICU. He was placed on IV Cardizem. His home dose of Metoprolol, 100 mg BID was continued, as was Eliquis. A repeat 2D echo was obtained and reveals a notable decrease in systolic function compared to prior study with new EF at 20-25% (40-45% 09/2012). Diffuse hypokinesis was noted. The aortic valve appeared structurally normal. The mitral valve is mildly thickened w/o significant regurgitation. Review of his home meds reveal that he had been on Verapamil, 180 mg daily.   Today, he is w/o any major complaints. Ventricular rate has improved into the 90s. SBP is in the upper 90s. He denies any increased dyspnea. No chest pain or anginal symptoms. He denies palpitations, syncope/ near syncope.  Past Medical History    Diagnosis Date  . CAD (coronary artery disease), native coronary artery     Status post CABG  . Hypertension   . Hyperlipidemia   . GERD (gastroesophageal reflux disease)   . COPD (chronic obstructive pulmonary disease)   . CRF (chronic renal failure)   . Barrett's esophagus   . OP (osteoporosis)   . CAD (coronary artery disease) of artery bypass graft 07/04/2011    LIMA-LAD; SVG-OM2-OM3, SVG-RCA -- all patent as of 05/27/11  . Syncope 07/04/2011  . Pacemaker 07/04/2011  . Cardiomyopathy, ischemic 07/2011; 09/2012    2D Echo - EF 35-40, mod dilated LA; Relook Echo 09/2012: EF 40-45%  . Myocardial infarction     25 yrs ago  . Peripheral vascular disease   . Arthritis   . BPH (benign prostatic hyperplasia)   . Polyp of colon     removed  . Sleep apnea     STOP BANG SCORE 4  . AV block, 3rd degree     Post [employer] pacemaker, EF 35-40, does have intermittent atrial tachycardia, either PAt or Afib  . BPH (benign prostatic hypertrophy) 11/15/2011  . Paroxysmal atrial fibrillation - now rate controlled (formally with RVR and Hospital) 10/03/2012  . CVA (cerebral infarction) 10/09/2012    Noted to be in atrial fibrillation with evaluation of pacemaker, prior to CVA. Right-sided MCA stroke with left arm weakness/mild paralysis -- notably improved     Past Surgical History  Procedure Laterality Date  . Pacemaker placement  2007  . Cholecystectomy  2005  . Coronary artery bypass graft  1978    X3  . Lesion excision      from penis  . Cardiac catheterization  06/06/2011    occluded proximal RCA, ostial LAD and mid Circumflex after OM 1.; [place] LIMA-LAD, patent SVG-OM1-OM 2, patent SVG-RCA. Reduced ejection fraction of 30-35%.  . Cardiac catheterization  06/14/2010    LIMA to LAD, SVG to RCA, SVG to OM1 Circumflex, 100% occluded RCA, 100% occluded circumfkex, 100% occluded LAD, no evidence of graft dysfunction, continue medical therapy  . Cardiac catheterization  04/12/2003    3 vessel coronary  artery disease, continue medical treatment  . Cardiac catheterization  10/07/2000    Severe 3 vessel coronary artery disease, continue medical treatment  . Doppler echocardiography  10/02/2012    EF 40-45%; possible grade 1 diastolic dysfunction, but A. fib precludes this. Mild atrial dilatation bilaterally. Moderately elevated pulmonary pressures of 40 mmHg.    Family History  Problem Relation Age of Onset  . Leukemia Father   . Heart disease Father   . Alzheimer's disease Mother   . Arthritis Mother   . Lung cancer Son 9  . Alzheimer's disease Maternal Grandmother     Social History:  reports that he quit smoking about 28 years ago. His smoking use included Cigarettes. He has a 4.5 pack-year smoking history. He has never used smokeless tobacco. He reports that he does not drink alcohol or use illicit drugs.  Allergies: No Known Allergies  Medications:  Prior to Admission medications   Medication Sig Start Date End Date Taking? Authorizing Provider  albuterol-ipratropium (COMBIVENT) 18-103 MCG/ACT inhaler Inhale 2 puffs into the lungs 2 (two) times daily.    Yes Historical Provider, MD  apixaban (ELIQUIS) 5 MG TABS tablet Take 1 tablet (5 mg total) by mouth 2 (two) times daily. 11/26/13  Yes Leonie Man, MD  atorvastatin (LIPITOR) 20 MG tablet Take 20 mg by mouth daily with breakfast.    Yes Historical Provider, MD  fluticasone (FLONASE) 50 MCG/ACT nasal spray Place 1 spray into both nostrils 2 (two) times daily as needed for allergies or rhinitis.   Yes Historical Provider, MD  Fluticasone-Salmeterol (ADVAIR DISKUS) 500-50 MCG/DOSE AEPB Inhale 1 puff into the lungs every 12 (twelve) hours.    Yes Historical Provider, MD  furosemide (LASIX) 40 MG tablet Take 60 mg by mouth 2 (two) times daily.   Yes Historical Provider, MD  gabapentin (NEURONTIN) 300 MG capsule Take 600 mg by mouth 3 (three) times daily.  02/07/11  Yes Historical Provider, MD  guaiFENesin (MUCINEX) 600 MG 12 hr  tablet Take 1,200 mg by mouth 2 (two) times daily.   Yes Historical Provider, MD  loratadine (CLARITIN) 10 MG tablet Take 10 mg by mouth daily with breakfast.    Yes Historical Provider, MD  Magnesium Chloride (SLOW-MAG) 535 (64 MG) MG TBCR Take 1 tablet by mouth 2 (two) times daily.    Yes Historical Provider, MD  metoprolol (LOPRESSOR) 100 MG tablet Take 100 mg by mouth 2 (two) times daily.   Yes Historical Provider, MD  Omega-3 Fatty Acids (FISH OIL) 1000 MG CAPS Take 1,000 mg by mouth daily.    Yes Historical Provider, MD  potassium chloride (K-DUR,KLOR-CON) 10 MEQ tablet Take 10 mEq by mouth 2 (two) times daily.   Yes Historical Provider, MD  predniSONE (DELTASONE) 5 MG tablet Take 5 mg by mouth daily.   Yes Historical Provider, MD  ranitidine (  ZANTAC) 150 MG capsule Take 150 mg by mouth 2 (two) times daily.    Yes Historical Provider, MD  tiotropium (SPIRIVA) 18 MCG inhalation capsule Place 18 mcg into inhaler and inhale daily with breakfast.    Yes Historical Provider, MD  verapamil (CALAN-SR) 180 MG CR tablet Take 180 mg by mouth every morning.   Yes Historical Provider, MD  Vitamin D, Ergocalciferol, (DRISDOL) 50000 UNITS CAPS Take 50,000 Units by mouth every 7 (seven) days. Take on Fridays   Yes Historical Provider, MD  vitamin E 1000 UNIT capsule Take 1,000 Units by mouth daily with breakfast.    Yes Historical Provider, MD     Results for orders placed during the hospital encounter of 01/27/14 (from the past 48 hour(s))  I-STAT TROPOININ, ED     Status: None   Collection Time    01/27/14  8:53 AM      Result Value Ref Range   Troponin i, poc 0.01  0.00 - 0.08 ng/mL   Comment 3            Comment: Due to the release kinetics of cTnI,     a negative result within the first hours     of the onset of symptoms does not rule out     myocardial infarction with certainty.     If myocardial infarction is still suspected,     repeat the test at appropriate intervals.  URINALYSIS,  ROUTINE W REFLEX MICROSCOPIC     Status: None   Collection Time    01/27/14  9:05 AM      Result Value Ref Range   Color, Urine YELLOW  YELLOW   APPearance CLEAR  CLEAR   Specific Gravity, Urine 1.011  1.005 - 1.030   pH 5.0  5.0 - 8.0   Glucose, UA NEGATIVE  NEGATIVE mg/dL   Hgb urine dipstick NEGATIVE  NEGATIVE   Bilirubin Urine NEGATIVE  NEGATIVE   Ketones, ur NEGATIVE  NEGATIVE mg/dL   Protein, ur NEGATIVE  NEGATIVE mg/dL   Urobilinogen, UA 0.2  0.0 - 1.0 mg/dL   Nitrite NEGATIVE  NEGATIVE   Leukocytes, UA NEGATIVE  NEGATIVE   Comment: MICROSCOPIC NOT DONE ON URINES WITH NEGATIVE PROTEIN, BLOOD, LEUKOCYTES, NITRITE, OR GLUCOSE <1000 mg/dL.  LEGIONELLA ANTIGEN, URINE     Status: None   Collection Time    01/27/14  9:05 AM      Result Value Ref Range   Specimen Description URINE, CATHETERIZED     Special Requests NONE     Legionella Antigen, Urine       Value: Negative for Legionella pneumophilia serogroup 1     Performed at [facility]   Report Status 01/28/2014 FINAL    STREP PNEUMONIAE URINARY ANTIGEN     Status: None   Collection Time    01/27/14  9:05 AM      Result Value Ref Range   Strep Pneumo Urinary Antigen NEGATIVE  NEGATIVE   Comment:            Infection due to S. pneumoniae     cannot be absolutely ruled out     since the antigen present     may be below the detection limit     of the test.  CULTURE, BLOOD (ROUTINE X 2)     Status: None   Collection Time    01/27/14  9:20 AM      Result Value Ref Range   Specimen Description BLOOD  RIGHT FOREARM     Special Requests BOTTLES DRAWN AEROBIC ONLY 10CC     Culture  Setup Time       Value: 01/27/2014 14:12     Performed at [facility]   Culture       Value:        BLOOD CULTURE RECEIVED NO GROWTH TO DATE CULTURE WILL BE HELD FOR 5 DAYS BEFORE ISSUING A FINAL NEGATIVE REPORT     Performed at [facility]   Report Status PENDING    CBC     Status: Abnormal   Collection Time     01/27/14  9:29 AM      Result Value Ref Range   WBC 10.2  4.0 - 10.5 K/uL   RBC 5.43  4.22 - 5.81 MIL/uL   Hemoglobin 15.4  13.0 - 17.0 g/dL   HCT 46.8  39.0 - 52.0 %   MCV 86.2  78.0 - 100.0 fL   MCH 28.4  26.0 - 34.0 pg   MCHC 32.9  30.0 - 36.0 g/dL   RDW 16.1 (*) 11.5 - 15.5 %   Platelets 172  150 - 400 K/uL  PRO B NATRIURETIC PEPTIDE     Status: Abnormal   Collection Time    01/27/14  9:29 AM      Result Value Ref Range   Pro B Natriuretic peptide (BNP) 6726.0 (*) 0 - 450 pg/mL  LACTIC ACID, PLASMA     Status: Abnormal   Collection Time    01/27/14  9:29 AM      Result Value Ref Range   Lactic Acid, Venous 2.3 (*) 0.5 - 2.2 mmol/L  CULTURE, BLOOD (ROUTINE X 2)     Status: None   Collection Time    01/27/14  9:29 AM      Result Value Ref Range   Specimen Description BLOOD LEFT ANTECUBITAL     Special Requests BOTTLES DRAWN AEROBIC ONLY 5CC     Culture  Setup Time       Value: 01/27/2014 14:12     Performed at [facility]   Culture       Value:        BLOOD CULTURE RECEIVED NO GROWTH TO DATE CULTURE WILL BE HELD FOR 5 DAYS BEFORE ISSUING A FINAL NEGATIVE REPORT     Performed at [facility]   Report Status PENDING    GLUCOSE, CAPILLARY     Status: Abnormal   Collection Time    01/27/14 10:58 AM      Result Value Ref Range   Glucose-Capillary 114 (*) 70 - 99 mg/dL  MRSA PCR SCREENING     Status: None   Collection Time    01/27/14 11:05 AM      Result Value Ref Range   MRSA by PCR NEGATIVE  NEGATIVE   Comment:            The GeneXpert MRSA Assay (FDA     approved for NASAL specimens     only), is one component of a     comprehensive MRSA colonization     surveillance program. It is not     intended to diagnose MRSA     infection nor to guide or     monitor treatment for     MRSA infections.  POCT I-STAT 3, ART BLOOD GAS (G3+)     Status: Abnormal   Collection Time    01/27/14 11:25 AM  Result Value Ref Range   pH, Arterial 7.394  7.350 -  7.450   pCO2 arterial 48.5 (*) 35.0 - 45.0 mmHg   pO2, Arterial 314.0 (*) 80.0 - 100.0 mmHg   Bicarbonate 29.6 (*) 20.0 - 24.0 mEq/L   TCO2 31  0 - 100 mmol/L   O2 Saturation 100.0     Acid-Base Excess 4.0 (*) 0.0 - 2.0 mmol/L   Collection site RADIAL, ALLEN'S TEST ACCEPTABLE     Sample type ARTERIAL    COMPREHENSIVE METABOLIC PANEL     Status: Abnormal   Collection Time    01/27/14 12:00 PM      Result Value Ref Range   Sodium 138  137 - 147 mEq/L   Potassium 3.4 (*) 3.7 - 5.3 mEq/L   Chloride 98  96 - 112 mEq/L   CO2 25  19 - 32 mEq/L   Glucose, Bld 122 (*) 70 - 99 mg/dL   BUN 30 (*) 6 - 23 mg/dL   Creatinine, Ser 1.79 (*) 0.50 - 1.35 mg/dL   Calcium 9.1  8.4 - 10.5 mg/dL   Total Protein 6.3  6.0 - 8.3 g/dL   Albumin 3.5  3.5 - 5.2 g/dL   AST 22  0 - 37 U/L   ALT 35  0 - 53 U/L   Alkaline Phosphatase 55  39 - 117 U/L   Total Bilirubin 2.1 (*) 0.3 - 1.2 mg/dL   GFR calc non Af Amer 33 (*) >90 mL/min   GFR calc Af Amer 38 (*) >90 mL/min   Comment: (NOTE)     The eGFR has been calculated using the CKD EPI equation.     This calculation has not been validated in all clinical situations.     eGFR's persistently <90 mL/min signify possible Chronic Kidney     Disease.   Anion gap 15  5 - 15  TROPONIN I     Status: None   Collection Time    01/27/14  1:20 PM      Result Value Ref Range   Troponin I <0.30  <0.30 ng/mL   Comment:            Due to the release kinetics of cTnI,     a negative result within the first hours     of the onset of symptoms does not rule out     myocardial infarction with certainty.     If myocardial infarction is still suspected,     repeat the test at appropriate intervals.  GLUCOSE, CAPILLARY     Status: Abnormal   Collection Time    01/27/14  3:58 PM      Result Value Ref Range   Glucose-Capillary 211 (*) 70 - 99 mg/dL  TROPONIN I     Status: None   Collection Time    01/27/14  7:34 PM      Result Value Ref Range   Troponin I <0.30  <0.30  ng/mL   Comment:            Due to the release kinetics of cTnI,     a negative result within the first hours     of the onset of symptoms does not rule out     myocardial infarction with certainty.     If myocardial infarction is still suspected,     repeat the test at appropriate intervals.  BASIC METABOLIC PANEL     Status: Abnormal   Collection Time  01/28/14  2:58 AM      Result Value Ref Range   Sodium 139  137 - 147 mEq/L   Potassium 3.5 (*) 3.7 - 5.3 mEq/L   Chloride 99  96 - 112 mEq/L   CO2 25  19 - 32 mEq/L   Glucose, Bld 156 (*) 70 - 99 mg/dL   BUN 28 (*) 6 - 23 mg/dL   Creatinine, Ser 1.56 (*) 0.50 - 1.35 mg/dL   Calcium 8.3 (*) 8.4 - 10.5 mg/dL   GFR calc non Af Amer 39 (*) >90 mL/min   GFR calc Af Amer 45 (*) >90 mL/min   Comment: (NOTE)     The eGFR has been calculated using the CKD EPI equation.     This calculation has not been validated in all clinical situations.     eGFR's persistently <90 mL/min signify possible Chronic Kidney     Disease.   Anion gap 15  5 - 15  CBC     Status: Abnormal   Collection Time    01/28/14  2:58 AM      Result Value Ref Range   WBC 17.6 (*) 4.0 - 10.5 K/uL   RBC 4.80  4.22 - 5.81 MIL/uL   Hemoglobin 13.6  13.0 - 17.0 g/dL   HCT 40.9  39.0 - 52.0 %   MCV 85.2  78.0 - 100.0 fL   MCH 28.3  26.0 - 34.0 pg   MCHC 33.3  30.0 - 36.0 g/dL   RDW 15.9 (*) 11.5 - 15.5 %   Platelets 149 (*) 150 - 400 K/uL    Dg Chest Port 1 View  01/28/2014   CLINICAL DATA:  Evaluate pneumonia  EXAM: PORTABLE CHEST - 1 VIEW  COMPARISON:  01/27/2014  FINDINGS: Persistent right upper lobe pneumonia is identified although mild increase in aeration in the right upper lobe is noted. The lungs are otherwise within normal limits. Postsurgical changes are seen. Cardiac shadow is stable. No acute bony abnormality is noted. A pacing device is again seen.  IMPRESSION: Persistent but mildly improved right upper lobe pneumonia.   Electronically Signed   By: Inez Catalina M.D.   On: 01/28/2014 07:05   Dg Chest Portable 1 View  01/27/2014   CLINICAL DATA:  History of CVA  EXAM: PORTABLE CHEST - 1 VIEW  COMPARISON:  04/01/2013  FINDINGS: Cardiomegaly is noted. Dual lead cardiac pacemaker is unchanged in position. Status post median sternotomy. There is infiltrate/ consolidation in right upper lobe highly suspicious for pneumonia. Follow-up to resolution is recommended. No pulmonary edema.  IMPRESSION: Cardiomegaly. Dual lead cardiac pacemaker in place. Infiltrate/ consolidation in right upper lobe highly suspicious for pneumonia. Follow-up to resolution is recommended.   Electronically Signed   By: Lahoma Crocker M.D.   On: 01/27/2014 08:59    Review of Systems  Respiratory: Positive for cough and shortness of breath.   Cardiovascular: Negative for chest pain and palpitations.  Neurological: Negative for dizziness and loss of consciousness.  All other systems reviewed and are negative.  Blood pressure 99/61, pulse 90, temperature 98 F (36.7 C), temperature source Oral, resp. rate 21, height $RemoveBe'6\' 7"'[id number]$  (2.007 m), weight 243 lb 13.3 oz (110.6 kg), SpO2 97.00%. Physical Exam  Constitutional: He is oriented to person, place, and time. He appears well-developed and well-nourished. No distress.  Neck: No JVD present.  Cardiovascular: Exam reveals no gallop and no friction rub.   No murmur heard. Respiratory: Breath sounds normal. He is  in respiratory distress. He has no wheezes (mild).  Musculoskeletal: He exhibits edema (4+ bilateral pitting edema).  Neurological: He is alert and oriented to person, place, and time.  Skin: Skin is warm and dry. He is not diaphoretic.  Psychiatric: He has a normal mood and affect. His behavior is normal.    Assessment/Plan: Active Problems:   Pneumonia   Acute respiratory failure   Atrial fibrillation with RVR  1. Acute on Chronic CHF: BNP elevated at 6K. Physical exam notable for significant peripheral edema. EF is reduced  compared to prior at 20-25%. Acute decompensation likely secondary to PNA. Continue treatment for PNA per primary team. Will need further diureses with Lasix. Monitor renal function and electrolytes closely. Strict I/Os, daily weights and low sodium diet.   2. Afib: ventricular rate is improved into the 90s. He is currently on 100 mg of Lopressor BID. Will discontinue IV Cardizem. Avoid further use of PO CCBs due to systolic dysfunction. Also avoid use of digoxin given baseline renal insufficiency. Continue Eliquis for stroke prophylaxis. Monitor HR response with PRN albuterol. If he has subsequent reflex tachycardia, recommend switching to PRN Xopenex.   3. Cardiomyopathy: EF reduced to 20-25%. He has a h/o CABG with patent grafts on last LHC in 2013. He denies any recent h/o CP. Cardiac enzymes are negative. Can consider outpatient nuclear study once acute illness resolves. No indication for inpatient ischemic eval at this time. Continue BB.     SIMMONS, [place] 01/28/2014, 2:45 PM     I have seen and examined the patient along with Lyda Jester, PA.  I have reviewed the chart, notes and new data.  I agree with PA's note.  Key new complaints: slow improvement since admission Key examination changes: S3 present, 2-3+ pitting edema pretibially, has not been weighed today Key new findings / data: echo reviewed  Direct comparison of echo with previous study clearly shows deterioration in LV function, but the study was done when he was acutely ill, HR in 130s. No clear new regional abnormality  PLAN: Resume his beta blocker for rate control and avoid diltiazem and verapamil. If additional meds are needed for rate control, recommend starting amiodarone. Hopefully, will return to NSR as pneumonia resolves. Needs diuresis, daily weights. Continue antcoagulants. Once pneumonia resolves, needs reassessment for progression of CAD, but the drop in EF may be transient due to tachyarrhythmia and  acute illness.  Sanda Klein, MD, [place] [phone] 01/28/2014, 4:09 PM

## 2014-01-28 NOTE — Evaluation (Signed)
Clinical/Bedside Swallow Evaluation Patient Details  Name: Richard Chavez MRN: [id number] Date of Birth: 1927-05-06  Today's Date: 01/28/2014 Time: 1032-1048 SLP Time Calculation (min): 16 min  Past Medical History:  Past Medical History  Diagnosis Date  . CAD (coronary artery disease), native coronary artery     Status post CABG  . Hypertension   . Hyperlipidemia   . GERD (gastroesophageal reflux disease)   . COPD (chronic obstructive pulmonary disease)   . CRF (chronic renal failure)   . Barrett's esophagus   . OP (osteoporosis)   . CAD (coronary artery disease) of artery bypass graft 07/04/2011    LIMA-LAD; SVG-OM2-OM3, SVG-RCA -- all patent as of 05/27/11  . Syncope 07/04/2011  . Pacemaker 07/04/2011  . Cardiomyopathy, ischemic 07/2011; 09/2012    2D Echo - EF 35-40, mod dilated LA; Relook Echo 09/2012: EF 40-45%  . Myocardial infarction     25 yrs ago  . Peripheral vascular disease   . Arthritis   . BPH (benign prostatic hyperplasia)   . Polyp of colon     removed  . Sleep apnea     STOP BANG SCORE 4  . AV block, 3rd degree     Post St[employer] Jude pacemaker, EF 35-40, does have intermittent atrial tachycardia, either PAt or Afib  . BPH (benign prostatic hypertrophy) 11/15/2011  . Paroxysmal atrial fibrillation - now rate controlled (formally with RVR and Hospital) 10/03/2012  . CVA (cerebral infarction) 10/09/2012    Noted to be in atrial fibrillation with evaluation of pacemaker, prior to CVA. Right-sided MCA stroke with left arm weakness/mild paralysis -- notably improved    Past Surgical History:  Past Surgical History  Procedure Laterality Date  . Pacemaker placement  2007  . Cholecystectomy  2005  . Coronary artery bypass graft  1978    X3  . Lesion excision      from penis  . Cardiac catheterization  06/06/2011    occluded proximal RCA, ostial LAD and mid Circumflex after OM 1.; [place] LIMA-LAD, patent SVG-OM1-OM 2, patent SVG-RCA. Reduced ejection fraction of 30-35%.  .  Cardiac catheterization  06/14/2010    LIMA to LAD, SVG to RCA, SVG to OM1 Circumflex, 100% occluded RCA, 100% occluded circumfkex, 100% occluded LAD, no evidence of graft dysfunction, continue medical therapy  . Cardiac catheterization  04/12/2003    3 vessel coronary artery disease, continue medical treatment  . Cardiac catheterization  10/07/2000    Severe 3 vessel coronary artery disease, continue medical treatment  . Doppler echocardiography  10/02/2012    EF 40-45%; possible grade 1 diastolic dysfunction, but A. fib precludes this. Mild atrial dilatation bilaterally. Moderately elevated pulmonary pressures of 40 mmHg.   HPI:  80 y/o WM with PMH of right  MCA CVA, GERD, CHF, Barretts esophagus, COPD, recurrent PNA,  who presented to the [facility] via EMS with complaints of worsening SOB and congestion. Work up concerning for RUL CAP, concern for recurrent aspiration. Required BiPAP in ER.    Assessment / Plan / Recommendation Clinical Impression  Bedside swallow evaluation complete. While patient presents with only subtle and questionable s/s of aspiration with po trials characterized by delayed cough response, patient with numerous risk factors for aspiration including h/o CVA, COPD, GERD, Barretts esophagus and h/o recurrent PNA warranting instrumental testing. Will f/u with FEES this pm as body habitus will not allow for MBSS.     Aspiration Risk  Mild    Diet Recommendation NPO   Medication Administration: Via  alternative means    Other  Recommendations Recommended Consults: FEES Oral Care Recommendations:  (QID)   Follow Up Recommendations   (TBD)       Pertinent Vitals/Pain n/a        Swallow Study    General HPI: 80 y/o WM with PMH of right  MCA CVA, GERD, CHF, Barretts esophagus, COPD, recurrent PNA,  who presented to the [facility] via EMS with complaints of worsening SOB and congestion. Work up concerning for RUL CAP, concern for recurrent aspiration. Required BiPAP in ER.  Type  of Study: Bedside swallow evaluation Previous Swallow Assessment: none Diet Prior to this Study: NPO Temperature Spikes Noted: No Respiratory Status: Nasal cannula History of Recent Intubation: No Behavior/Cognition: Alert;Cooperative;Pleasant mood Oral Cavity - Dentition: Missing dentition;Edentulous Self-Feeding Abilities: Able to feed self Patient Positioning: Upright in chair Baseline Vocal Quality: Clear Volitional Cough: Strong Volitional Swallow: Able to elicit    Oral/Motor/Sensory Function Overall Oral Motor/Sensory Function: Appears within functional limits for tasks assessed   Ice Chips Ice chips: Not tested   Thin Liquid Thin Liquid: Impaired Presentation: Cup;Self Fed;Straw Pharyngeal  Phase Impairments: Cough - Delayed    Nectar Thick Nectar Thick Liquid: Not tested   Honey Thick Honey Thick Liquid: Not tested   Puree Puree: Within functional limits Presentation: Spoon   Solid   GO    Solid: Impaired Presentation: Self Fed Pharyngeal Phase Impairments: Cough - Delayed      [name] MA, CCC-SLP ([phone]  Jaymi Tinner Meryl 01/28/2014,10:52 AM

## 2014-01-28 NOTE — Progress Notes (Addendum)
PULMONARY / CRITICAL CARE MEDICINE   Name: Richard Chavez MRN: [id number] DOB: 1926-08-04    ADMISSION DATE:  01/27/2014 CONSULTATION DATE:  01/27/14  REFERRING MD :  ED Physician  CHIEF COMPLAINT:  Increasing SOB and congestion  INITIAL PRESENTATION:  80 y/o WM with PMH of CVA, GERD, CHF who presented to the [facility] via EMS with complaints of worsening SOB and congestion.  Work up concerning for RML CAP.    STUDIES:  9/23 Echo> LVEF 20-25%, diffuse hypokinesis  SIGNIFICANT EVENTS: 9/23  Admit with SOB & Congestion, w/u consistent with CAP.  Required BiPAP in ER + ICU admit 9/24 swallow Eval>   SUBJECTIVE: feeling better, off BIPAP, WBC up some today  VITAL SIGNS: Temp:  [97.4 F (36.3 C)-98.4 F (36.9 C)] 97.4 F (36.3 C) (09/24 0828) Pulse Rate:  [32-139] 139 (09/24 1000) Resp:  [16-28] 28 (09/24 1000) BP: (100-132)/(44-87) 126/58 mmHg (09/24 1000) SpO2:  [96 %-100 %] 97 % (09/24 1000) FiO2 (%):  [40 %-60 %] 40 % (09/24 0400)  HEMODYNAMICS:    VENTILATOR SETTINGS: Vent Mode:  [-] BIPAP FiO2 (%):  [40 %-60 %] 40 % Set Rate:  [10 bmp] 10 bmp PEEP:  [5 cmH20] 5 cmH20  INTAKE / OUTPUT:  Intake/Output Summary (Last 24 hours) at 01/28/14 1110 Last data filed at 01/28/14 1000  Gross per 24 hour  Intake 1398.92 ml  Output    865 ml  Net 533.92 ml    PHYSICAL EXAMINATION: General:  Comfortable in chair HEENT: NCAT, EOMi PULM: crackles R, otherwise clear CV: Irreg irreg, tachy, no mgr AB: BS+, soft, Ext: warm, pitting edema legs Neuro: Awake and alert, maew  LABS:  CBC  Recent Labs Lab 01/27/14 0929 01/28/14 0258  WBC 10.2 17.6*  HGB 15.4 13.6  HCT 46.8 40.9  PLT 172 149*   Coag's No results found for this basename: APTT, INR,  in the last 168 hours  BMET  Recent Labs Lab 01/27/14 1200 01/28/14 0258  NA 138 139  K 3.4* 3.5*  CL 98 99  CO2 25 25  BUN 30* 28*  CREATININE 1.79* 1.56*  GLUCOSE 122* 156*    Electrolytes  Recent Labs Lab  01/27/14 1200 01/28/14 0258  CALCIUM 9.1 8.3*    Sepsis Markers  Recent Labs Lab 01/27/14 0929  LATICACIDVEN 2.3*   ABG  Recent Labs Lab 01/27/14 1125  PHART 7.394  PCO2ART 48.5*  PO2ART 314.0*   Liver Enzymes  Recent Labs Lab 01/27/14 1200  AST 22  ALT 35  ALKPHOS 55  BILITOT 2.1*  ALBUMIN 3.5    Cardiac Enzymes  Recent Labs Lab 01/27/14 0929 01/27/14 1320 01/27/14 1934  TROPONINI  --  <0.30 <0.30  PROBNP 6726.0*  --   --    Glucose  Recent Labs Lab 01/27/14 1058 01/27/14 1558  GLUCAP 114* 211*    Imaging Dg Chest Portable 1 View  01/27/2014   CLINICAL DATA:  History of CVA  EXAM: PORTABLE CHEST - 1 VIEW  COMPARISON:  04/01/2013  FINDINGS: Cardiomegaly is noted. Dual lead cardiac pacemaker is unchanged in position. Status post median sternotomy. There is infiltrate/ consolidation in right upper lobe highly suspicious for pneumonia. Follow-up to resolution is recommended. No pulmonary edema.  IMPRESSION: Cardiomegaly. Dual lead cardiac pacemaker in place. Infiltrate/ consolidation in right upper lobe highly suspicious for pneumonia. Follow-up to resolution is recommended.   Electronically Signed   By: Natasha Mead M.D.   On: 01/27/2014 08:59  ASSESSMENT / PLAN:  PULMONARY OETT n/a BiPAP 9/23 only A: Acute Hypoxic Respiratory failure > improved RUL PNA - CAP, recurrent, could this be aspiration? COPD not in exacerbation OSA - does not wear CPAP P:   D/c bipap O2 for SpO2 > 92% Stop duoneb Add back home Advair and Spiriva Prn albuterol Continue home dose prednisone SLP eval  CARDIOVASCULAR CVL n/a A:  Afib with RVR - in setting of decompensated CHF + PNA, on baseline verapamil + metoprolol Acute on Chronic CHF - > LVEF now 20-25% on 9/23 echo HTN  Hx CAD P:  Cardiology consult given new decrease in LVEF and A-fib with RVR Adjust Eliquis dose for renal failure Continue lasix Increase home dose metoprolol Verapamil? (not in  cardiology notes but on home med list) Monitor I/O's  RENAL A:  Chronic renal failure (baseline Cr 1.2-1.6) at baseline Hypokalemia P:   Monitor BMET and UOP Replace electrolytes as needed  GASTROINTESTINAL A:  GERD Hx Barrett's esophagus Concern for Recurrent Aspiration  P:   SLP evaluation > advance diet, medications OK Protonix  HEMATOLOGIC A:  Anticoagulated for Afib Anemia  P:  Continue Eliquis at 1/2 dose adjusted for afib Monitor CBC Tx for Hgb <7%, active bleeding or MI <8%  INFECTIOUS A:  CAP - RML infiltrate, recurrent PNA's P:   BCx2 9/23>> UC 9/23>> Sputum 9/23 >> Abx: Azithromycin, start date 9/23, day 2/x  Ceftriaxone, start date 9/23, day 2/x Would transition to oral Levaquin if remains stable on day 3  ENDOCRINE A:  Chronic Prednisone Dependence - 5mg  QD for COPD  P:   Monitor glucose trend on BMP  NEUROLOGIC A:  Hx CVA - residual weakness to UE Chronic LE pain Mild dementia? P:   RASS goal: n/a Hold home Neurontin  Minimize sedating medications  GLOBAL Discussed goals of care with son.  Agreeable to short term intubation, but would not want long term support.  TODAY'S SUMMARY:  80 yo M here with severe CAP improving, now with a-fib with RVR and echo shows worsening LVEF.  Increase b-blocker, lasix, could likely go to step down  Heber , MD [place] [facility] Pager: [phone] Cell: [phone] If no response, call [phone]  01/28/2014, 11:10 AM

## 2014-01-28 NOTE — Progress Notes (Signed)
[facility] ADULT ICU REPLACEMENT PROTOCOL FOR AM LAB REPLACEMENT ONLY  The patient does apply for the [facility] Adult ICU Electrolyte Replacment Protocol based on the criteria listed below:   1. Is GFR >/= 40 ml/min? Yes.    Patient's GFR today is 45 2. Is urine output >/= 0.5 ml/kg/hr for the last 6 hours? Yes.   Patient's UOP is .79 ml/kg/hr 3. Is BUN < 60 mg/dL? Yes.    Patient's BUN today is 28 4. Abnormal electrolyte(s): Potassium 5. Ordered repletion with: Potassium per protocol    Taiylor Virden P 01/28/2014 6:24 AM

## 2014-01-28 NOTE — Progress Notes (Signed)
eLink Physician-Brief Progress Note Patient Name: LINN LIECHTY DOB: [birth year] MRN: [id number]   Date of Service  01/28/2014  HPI/Events of Note  Came ofd drip card, now after movement back to rvr 130  eICU Interventions  Card IV x 1 , eval resposnse     Intervention Category Major Interventions: Airway management;Arrhythmia - evaluation and management  FEINSTEIN,DANIEL J. 01/28/2014, 7:10 PM

## 2014-01-29 ENCOUNTER — Telehealth: Payer: Self-pay | Admitting: Neurology

## 2014-01-29 ENCOUNTER — Ambulatory Visit: Payer: Medicare Other | Admitting: Neurology

## 2014-01-29 DIAGNOSIS — R609 Edema, unspecified: Secondary | ICD-10-CM

## 2014-01-29 DIAGNOSIS — E7800 Pure hypercholesterolemia, unspecified: Secondary | ICD-10-CM

## 2014-01-29 LAB — CBC WITH DIFFERENTIAL/PLATELET
BASOS PCT: 0 % (ref 0–1)
Basophils Absolute: 0 10*3/uL (ref 0.0–0.1)
Eosinophils Absolute: 0 10*3/uL (ref 0.0–0.7)
Eosinophils Relative: 0 % (ref 0–5)
HEMATOCRIT: 40.8 % (ref 39.0–52.0)
Hemoglobin: 13.8 g/dL (ref 13.0–17.0)
Lymphocytes Relative: 10 % — ABNORMAL LOW (ref 12–46)
Lymphs Abs: 1.3 10*3/uL (ref 0.7–4.0)
MCH: 28.8 pg (ref 26.0–34.0)
MCHC: 33.8 g/dL (ref 30.0–36.0)
MCV: 85.2 fL (ref 78.0–100.0)
MONO ABS: 1 10*3/uL (ref 0.1–1.0)
MONOS PCT: 8 % (ref 3–12)
Neutro Abs: 11.5 10*3/uL — ABNORMAL HIGH (ref 1.7–7.7)
Neutrophils Relative %: 82 % — ABNORMAL HIGH (ref 43–77)
Platelets: 163 10*3/uL (ref 150–400)
RBC: 4.79 MIL/uL (ref 4.22–5.81)
RDW: 16 % — ABNORMAL HIGH (ref 11.5–15.5)
WBC: 13.9 10*3/uL — ABNORMAL HIGH (ref 4.0–10.5)

## 2014-01-29 LAB — BASIC METABOLIC PANEL
Anion gap: 16 — ABNORMAL HIGH (ref 5–15)
BUN: 28 mg/dL — AB (ref 6–23)
CHLORIDE: 98 meq/L (ref 96–112)
CO2: 25 meq/L (ref 19–32)
CREATININE: 1.44 mg/dL — AB (ref 0.50–1.35)
Calcium: 8.3 mg/dL — ABNORMAL LOW (ref 8.4–10.5)
GFR calc Af Amer: 50 mL/min — ABNORMAL LOW (ref >90)
GFR calc non Af Amer: 43 mL/min — ABNORMAL LOW (ref >90)
Glucose, Bld: 131 mg/dL — ABNORMAL HIGH (ref 70–99)
Potassium: 3.8 mEq/L (ref 3.7–5.3)
Sodium: 139 mEq/L (ref 137–147)

## 2014-01-29 MED ORDER — DM-GUAIFENESIN ER 30-600 MG PO TB12
1.0000 | ORAL_TABLET | Freq: Two times a day (BID) | ORAL | Status: DC
  Administered 2014-01-29 – 2014-02-06 (×15): 1 via ORAL
  Filled 2014-01-29 (×19): qty 1

## 2014-01-29 MED ORDER — ONDANSETRON HCL 4 MG/2ML IJ SOLN
4.0000 mg | Freq: Four times a day (QID) | INTRAMUSCULAR | Status: DC | PRN
  Administered 2014-01-31: 4 mg via INTRAVENOUS
  Filled 2014-01-29: qty 2

## 2014-01-29 MED ORDER — AMIODARONE LOAD VIA INFUSION
150.0000 mg | Freq: Once | INTRAVENOUS | Status: AC
  Administered 2014-01-29: 150 mg via INTRAVENOUS
  Filled 2014-01-29: qty 83.34

## 2014-01-29 MED ORDER — AMIODARONE HCL IN DEXTROSE 360-4.14 MG/200ML-% IV SOLN
30.0000 mg/h | INTRAVENOUS | Status: DC
  Administered 2014-01-29: 60 mg/h via INTRAVENOUS
  Administered 2014-01-30: 30 mg/h via INTRAVENOUS
  Filled 2014-01-29 (×6): qty 200

## 2014-01-29 MED ORDER — AZITHROMYCIN 500 MG PO TABS
500.0000 mg | ORAL_TABLET | Freq: Every day | ORAL | Status: DC
  Administered 2014-01-30: 500 mg via ORAL
  Filled 2014-01-29: qty 1

## 2014-01-29 MED ORDER — AMIODARONE HCL IN DEXTROSE 360-4.14 MG/200ML-% IV SOLN
60.0000 mg/h | INTRAVENOUS | Status: AC
  Administered 2014-01-29: 60 mg/h via INTRAVENOUS
  Filled 2014-01-29: qty 200

## 2014-01-29 NOTE — Progress Notes (Signed)
PROGRESS NOTE  Subjective:   80 yo with hx of A-Fib, CAD / CABG, chronic systolic CHF, HTN, CKD, pacer,  Was admitted with dyspnea and was diagnosed with pneumonia. Pacer was interrogated - he has had AFib for the past 79 days - does not appear to be related to the acute illness.   His main complaint is that of constipation   Objective:    Vital Signs:   Temp:  [97.7 F (36.5 C)-98.6 F (37 C)] 98 F (36.7 C) (09/25 0815) Pulse Rate:  [25-138] 119 (09/25 0815) Resp:  [14-25] 15 (09/25 0815) BP: (89-141)/(56-119) 133/64 mmHg (09/25 0815) SpO2:  [93 %-99 %] 93 % (09/25 0815)  Last BM Date: 01/28/14   24-hour weight change: Weight change:   Weight trends: Filed Weights   01/27/14 1100  Weight: 243 lb 13.3 oz (110.6 kg)    Intake/Output:  09/24 0701 - 09/25 0700 In: 1126.4 [P.O.:250; I.V.:476.4; IV Piggyback:400] Out: 1546 [Urine:1545; Stool:1] Total I/O In: 120 [P.O.:60; I.V.:10; IV Piggyback:50] Out: 175 [Urine:175]   Physical Exam: BP 133/64  Pulse 119  Temp(Src) 98 F (36.7 C) (Oral)  Resp 15  Ht 6\' 7"  (2.007 m)  Wt 243 lb 13.3 oz (110.6 kg)  BMI 27.46 kg/m2  SpO2 93%  Wt Readings from Last 3 Encounters:  01/27/14 243 lb 13.3 oz (110.6 kg)  10/28/13 249 lb 12.8 oz (113.309 kg)  08/05/13 246 lb (111.585 kg)    General: Vital signs reviewed and noted.   Head: Normocephalic, atraumatic.  Eyes: conjunctivae/corneas clear.  EOM's intact.   Throat: normal  Neck:  normal   Lungs:    + rhonchi  Heart:  Irreg. Irreg.   Abdomen:  Soft, non-tender, non-distended    Extremities: No edema    Neurologic: A&O X3, CN II - XII are grossly intact.   Psych: Normal     Labs: BMET:  Recent Labs  01/28/14 0258 01/29/14 0310  NA 139 139  K 3.5* 3.8  CL 99 98  CO2 25 25  GLUCOSE 156* 131*  BUN 28* 28*  CREATININE 1.56* 1.44*  CALCIUM 8.3* 8.3*    Liver function tests:  Recent Labs  01/27/14 1200  AST 22  ALT 35  ALKPHOS 55  BILITOT  2.1*  PROT 6.3  ALBUMIN 3.5   No results found for this basename: LIPASE, AMYLASE,  in the last 72 hours  CBC:  Recent Labs  01/28/14 0258 01/29/14 0310  WBC 17.6* 13.9*  NEUTROABS  --  11.5*  HGB 13.6 13.8  HCT 40.9 40.8  MCV 85.2 85.2  PLT 149* 163    Cardiac Enzymes:  Recent Labs  01/27/14 1320 01/27/14 1934  TROPONINI <0.30 <0.30    Coagulation Studies: No results found for this basename: LABPROT, INR,  in the last 72 hours  Other: No components found with this basename: POCBNP,  No results found for this basename: DDIMER,  in the last 72 hours No results found for this basename: HGBA1C,  in the last 72 hours No results found for this basename: CHOL, HDL, LDLCALC, TRIG, CHOLHDL,  in the last 72 hours No results found for this basename: TSH, T4TOTAL, FREET3, T3FREE, THYROIDAB,  in the last 72 hours No results found for this basename: VITAMINB12, FOLATE, FERRITIN, TIBC, IRON, RETICCTPCT,  in the last 72 hours   Other results:  EKG:  A-fib with RVR  Medications:    Infusions: . sodium chloride 10 mL/hr at 01/28/14 0600  .  diltiazem (CARDIZEM) infusion Stopped (01/29/14 0211)    Scheduled Medications: . apixaban  2.5 mg Oral BID  . azithromycin  500 mg Intravenous Q24H  . cefTRIAXone (ROCEPHIN)  IV  1 g Intravenous Q24H  . docusate sodium  100 mg Oral BID  . metoprolol tartrate  100 mg Oral BID  . mometasone-formoterol  2 puff Inhalation BID  . pantoprazole  40 mg Oral Q1200  . predniSONE  5 mg Oral Q breakfast  . tiotropium  18 mcg Inhalation Daily    Assessment/ Plan:   Active Problems:   Pneumonia   Acute respiratory failure   Atrial fibrillation with RVR   Acute on chronic systolic HF (heart failure)  1. Atrial fib:  Rapid rate -.  The Afib has been persistent .  The rapid rate is likely related to his underlying problems.  Rate is still elevated despite being on fairly high doses of metoprolol Will start amiodarone IV .     Disposition:   Length of Stay: 2  Alvia Grove., MD, [facility] 01/29/2014, 11:43 AM Office [phone] Pager [phone]

## 2014-01-29 NOTE — Progress Notes (Signed)
Patient was transferred from 59m to room 3e12. Noticed redness,and swelling above left arm IV/AC site. Amiodorone is running in IV line. Patient denies pain to left arm. I have looked at both arms,and chage nurse have looked to see if we could get a new IV started for patient. No sites noted. IV team has been paged.

## 2014-01-29 NOTE — Progress Notes (Signed)
Pacemaker interrogation shows a long episode of persistent atrial fibrillation that began 78 days ago, long before the acute illness.

## 2014-01-29 NOTE — Telephone Encounter (Signed)
Patient's son calling to cancel today's appointment since patient is in the hospital with pneumonia, wants to reschedule, please return call and advise since Dr. [name] doesn't have availability in the next few weeks.

## 2014-01-29 NOTE — Telephone Encounter (Signed)
Filled that appointment today at 2

## 2014-01-29 NOTE — Progress Notes (Signed)
Speech Language Pathology Treatment: Dysphagia  Patient Details Name: Richard Chavez MRN: [id number] DOB: Aug 20, 1926 Today's Date: 01/29/2014 Time: 2518-9842 SLP Time Calculation (min): 10 min  Assessment / Plan / Recommendation Clinical Impression  F/u after 9/24 FEES.  Son present.  Pt unable to recall results/recs from procedure yesterday.  Min verbal cues/spaced retrieval to recall precautions after discussion; son educated.  Pt consumed thin liquids with no s/s of aspiration.  C/O not feeling well, so declined solids.  Will f/u Monday, 9/28 to ensure toleration.     HPI HPI: 80 y/o WM with PMH of right  MCA CVA, GERD, CHF, Barretts esophagus, COPD, recurrent PNA,  who presented to the [facility] via EMS with complaints of worsening SOB and congestion. Work up concerning for RUL CAP, concern for recurrent aspiration. Required BiPAP in ER.    Pertinent Vitals Pain Assessment: No/denies pain  SLP Plan  Continue with current plan of care    Recommendations Diet recommendations: Regular;Thin liquid Liquids provided via: Cup Medication Administration: Whole meds with puree Supervision: Patient able to self feed;Full supervision/cueing for compensatory strategies Compensations: Slow rate;Small sips/bites Postural Changes and/or Swallow Maneuvers: Seated upright 90 degrees;Upright 30-60 min after meal              Oral Care Recommendations: Oral care BID Follow up Recommendations: None Plan: Continue with current plan of care    GO     Richard Chavez [name] 01/29/2014, 10:08 AM

## 2014-01-29 NOTE — Progress Notes (Signed)
[place] TEAM 1 - Stepdown/ICU TEAM Progress Note  Richard Chavez JYN:[id number] DOB: 06/18/1926 DOA: 01/27/2014 PCP: Thayer Headings, MD  Admit HPI / Brief Narrative: The patient is a 80 y/o WM PMHx  atrial fib with RVR (chronic on Eliquis), HTN, followed by Dr. Herbie Baltimore for significant cardiac history. Hx CAD with MI 25 years ago, s/p CABG x 4 with LIMA-LAD; SVG-OM2-OM3, SVG-RCA -- all patent as of 05/27/11, ischemic cardiomyopathy with a past EF of 35-40%, with improvement to 40-45% by 2D echo 09/2012. He has a pacemaker, followed by Dr. Royann Shivers. , CKD with baseline Scr of 1.8. In 2014, Rt sided stroke with residual left hand weakness.  Admitted to [facility] on 01/27/14 by Critical Care for CAP and acute CHF and was noted to be in atrial fibrillation with RVR.  On arrival to the ED yesterday, CXR revealed cardiomegaly and Infiltrate/ consolidation in right upper lobe highly suspicious for pneumonia. WBC was WNL on admit but now elevated at 17.6. BNP was elevated at 6726. Troponin negative x 3. He was hypokalemic with K at 3.4. Scr 1.79. EKG demonstrated Afib with ventricular response in the 130s. He was started on BiPap therapy, antibiotics and admitted to the ICU. He was placed on IV Cardizem. His home dose of Metoprolol, 100 mg BID was continued, as was Eliquis. A repeat 2D echo was obtained and reveals a notable decrease in systolic function compared to prior study with new EF at 20-25% (40-45% 09/2012). Diffuse hypokinesis was noted. Review of his home meds reveal that he had been on Verapamil, 180 mg daily.    HPI/Subjective: 9/25 patient A./O. x4, though struggles to find words when answering questions. (Per son this is new from yesterday). Negative SOB, negative CP negative DOE. Does complain of some waxing and waning nausea.   Assessment/Plan: Acute Hypoxic Respiratory failure  -Resolved  RUL PNA/ CAP (recurrent possible aspiration) -Continue antibiotics for a full seven-day course -Patient's  acute respiratory failure appears to resolve so continue patient's home dose of prednisone 5 mg daily -Ambulate every shift  COPD not in exacerbation - Chronic Prednisone Dependence(  QD) - Continue albuterol nebulizer PRN -Continue Dulera BID -Continue Spiriva daily -Flutter valve q 4hr while awake -Mucinex BID  OSA - does not wear CPAP  -Continue O2 to maintain SpO2 > 92%   A-Fib with RVR (chronic) - Per cardiology amiodarone drip -Continue metoprolol 100 mg BID  Acute on Chronic CHF - LVEF now 20-25% on 9/23 echo  -CHF Tm on Board -Strict I&O -Daily weight  HTN  -Within AHA guidelines see A. fib RVR  Hx CAD  -Cardiology CHF Tm following   -Continued Adjusted Eliquis dose for renal failure  - Chronic renal failure (baseline Cr 1.2-1.6)  -Patient slightly above baseline but improved continue to monitor closely   Hypokalemia  -Monitor BMET and UOP  -Replace electrolytes as needed  -Potassium goal> 4  GERD /Hx Barrett's esophagus/Concern for Recurrent Aspiration  -SLP evaluation; regular with thin liquid  -Continue Protonix 40 mg daily   Anemia  -Continue Eliquis at 1/2 dose adjusted renal dose -Monitor CBC  -Tx for Hgb <7%, active bleeding or MI <8%   Hx CVA - residual weakness to UE  -Per son patient had new cognitive deficit today, possible sundowning but if persist in a.m. would obtain head CT given his history  Chronic LE pain   Mild dementia?  -Hold home Neurontin  -Minimize sedating medications    Code Status: FULL Family Communication: Son  present at time of exam Disposition Plan: Per cardiology    Consultants: -Dr. Leodis Sias (cardiology) -Dr. Rory Percy [facility] M.)   Procedure/Significant Events: 9/23 Echo> LVEF 20-25%, diffuse hypokinesis    Culture 9/23 blood right forearm/left antecubital NGTD 9/23 urine negative 9/23 Sputum; does not appear to have been drawn   9/23 MRSA by PCR  negative   Antibiotics: Azithromycin 9/23>> Ceftriaxone 9/23>>   DVT prophylaxis:  Eliquis   Devices Pacer present right chest wall   LINES / TUBES:      Continuous Infusions: . sodium chloride 10 mL/hr at 01/28/14 0600  . amiodarone     Followed by  . amiodarone    . diltiazem (CARDIZEM) infusion Stopped (01/29/14 0211)    Objective: VITAL SIGNS: Temp: 97.8 F (36.6 C) (09/25 1200) Temp src: Oral (09/25 1200) BP: 133/64 mmHg (09/25 0815) Pulse Rate: 119 (09/25 0815) SPO2; FIO2:   Intake/Output Summary (Last 24 hours) at 01/29/14 1316 Last data filed at 01/29/14 1017  Gross per 24 hour  Intake 652.42 ml  Output   1480 ml  Net -827.58 ml     Exam: General: A./O. x4, (struggles for words when answering questions), NAD, No acute respiratory distress Lungs: Clear to auscultation bilaterally without wheezes or crackles Cardiovascular: Irregular irregular rhythm and rate without murmur gallop or rub normal S1 and S2 Abdomen: Nontender, nondistended, soft, bowel sounds positive, no rebound, no ascites, no appreciable mass Extremities: No significant cyanosis, clubbing, or edema bilateral lower extremities Neurologic; cranial nerves 2-12 intact, pupils equal round reactive to light and accommodation, patient moves all extremities to command, sensation intact, did not ambulate patient  Data Reviewed: Basic Metabolic Panel:  Recent Labs Lab 01/27/14 1200 01/28/14 0258 01/29/14 0310  NA 138 139 139  K 3.4* 3.5* 3.8  CL 98 99 98  CO2 GLUCOSE 122* 156* 131*  BUN 30* 28* 28*  CREATININE 1.79* 1.56* 1.44*  CALCIUM 9.1 8.3* 8.3*   Liver Function Tests:  Recent Labs Lab 01/27/14 1200  AST 22  ALT 35  ALKPHOS 55  BILITOT 2.1*  PROT 6.3  ALBUMIN 3.5   No results found for this basename: LIPASE, AMYLASE,  in the last 168 hours No results found for this basename: AMMONIA,  in the last 168 hours CBC:  Recent Labs Lab 01/27/14 0929  01/28/14 0258 01/29/14 0310  WBC 10.2 17.6* 13.9*  NEUTROABS  --   --  11.5*  HGB 15.4 13.6 13.8  HCT 46.8 40.9 40.8  MCV 86.2 85.2 85.2  PLT 172 149* 163   Cardiac Enzymes:  Recent Labs Lab 01/27/14 1320 01/27/14 1934  TROPONINI <0.30 <0.30   BNP (last 3 results)  Recent Labs  04/01/13 2115 04/23/13 1221 01/27/14 0929  PROBNP 1693.0* 1516.00* 6726.0*   CBG:  Recent Labs Lab 01/27/14 1058 01/27/14 1558  GLUCAP 114* 211*    Recent Results (from the past 240 hour(s))  URINE CULTURE     Status: None   Collection Time    01/27/14  9:05 AM      Result Value Ref Range Status   Specimen Description URINE, CATHETERIZED   Final   Special Requests NONE   Final   Culture  Setup Time     Final   Value: 01/27/2014 14:30     Performed at [facility] Count     Final   Value: NO GROWTH     Performed at [facility]  Culture     Final   Value: NO GROWTH     Performed at [facility]   Report Status 01/28/2014 FINAL   Final  CULTURE, BLOOD (ROUTINE X 2)     Status: None   Collection Time    01/27/14  9:20 AM      Result Value Ref Range Status   Specimen Description BLOOD RIGHT FOREARM   Final   Special Requests BOTTLES DRAWN AEROBIC ONLY 10CC   Final   Culture  Setup Time     Final   Value: 01/27/2014 14:12     Performed at [facility]   Culture     Final   Value:        BLOOD CULTURE RECEIVED NO GROWTH TO DATE CULTURE WILL BE HELD FOR 5 DAYS BEFORE ISSUING A FINAL NEGATIVE REPORT     Performed at [facility]   Report Status PENDING   Incomplete  CULTURE, BLOOD (ROUTINE X 2)     Status: None   Collection Time    01/27/14  9:29 AM      Result Value Ref Range Status   Specimen Description BLOOD LEFT ANTECUBITAL   Final   Special Requests BOTTLES DRAWN AEROBIC ONLY 5CC   Final   Culture  Setup Time     Final   Value: 01/27/2014 14:12     Performed at [facility]   Culture     Final   Value:         BLOOD CULTURE RECEIVED NO GROWTH TO DATE CULTURE WILL BE HELD FOR 5 DAYS BEFORE ISSUING A FINAL NEGATIVE REPORT     Performed at [facility]   Report Status PENDING   Incomplete  MRSA PCR SCREENING     Status: None   Collection Time    01/27/14 11:05 AM      Result Value Ref Range Status   MRSA by PCR NEGATIVE  NEGATIVE Final   Comment:            The GeneXpert MRSA Assay (FDA     approved for NASAL specimens     only), is one component of a     comprehensive MRSA colonization     surveillance program. It is not     intended to diagnose MRSA     infection nor to guide or     monitor treatment for     MRSA infections.     Studies:  Recent x-ray studies have been reviewed in detail by the Attending Physician  Scheduled Meds:  Scheduled Meds: . amiodarone  150 mg Intravenous Once  . apixaban  2.5 mg Oral BID  . azithromycin  500 mg Intravenous Q24H  . cefTRIAXone (ROCEPHIN)  IV  1 g Intravenous Q24H  . docusate sodium  100 mg Oral BID  . metoprolol tartrate  100 mg Oral BID  . mometasone-formoterol  2 puff Inhalation BID  . pantoprazole  40 mg Oral Q1200  . predniSONE  5 mg Oral Q breakfast  . tiotropium  18 mcg Inhalation Daily    Time spent on care of this patient: 40 mins   Drema Dallas , MD   [facility] Office  [phone] Pager - [phone]  On-Call/Text Page:      [email or web address]      password [email or web address]  If 7PM-7AM, please contact night-coverage [email or web address] Password [email or web address] 01/29/2014, 1:16 PM   LOS: 2 days

## 2014-01-30 DIAGNOSIS — I10 Essential (primary) hypertension: Secondary | ICD-10-CM

## 2014-01-30 DIAGNOSIS — R319 Hematuria, unspecified: Secondary | ICD-10-CM

## 2014-01-30 DIAGNOSIS — R339 Retention of urine, unspecified: Secondary | ICD-10-CM

## 2014-01-30 DIAGNOSIS — K2270 Barrett's esophagus without dysplasia: Secondary | ICD-10-CM

## 2014-01-30 LAB — CBC WITH DIFFERENTIAL/PLATELET
BASOS ABS: 0 10*3/uL (ref 0.0–0.1)
BASOS PCT: 0 % (ref 0–1)
EOS ABS: 0.1 10*3/uL (ref 0.0–0.7)
EOS PCT: 1 % (ref 0–5)
HEMATOCRIT: 42.2 % (ref 39.0–52.0)
Hemoglobin: 13.9 g/dL (ref 13.0–17.0)
Lymphocytes Relative: 11 % — ABNORMAL LOW (ref 12–46)
Lymphs Abs: 1.2 10*3/uL (ref 0.7–4.0)
MCH: 28.7 pg (ref 26.0–34.0)
MCHC: 32.9 g/dL (ref 30.0–36.0)
MCV: 87.2 fL (ref 78.0–100.0)
MONO ABS: 0.8 10*3/uL (ref 0.1–1.0)
Monocytes Relative: 7 % (ref 3–12)
Neutro Abs: 8.6 10*3/uL — ABNORMAL HIGH (ref 1.7–7.7)
Neutrophils Relative %: 81 % — ABNORMAL HIGH (ref 43–77)
Platelets: 154 10*3/uL (ref 150–400)
RBC: 4.84 MIL/uL (ref 4.22–5.81)
RDW: 16.3 % — AB (ref 11.5–15.5)
WBC: 10.6 10*3/uL — ABNORMAL HIGH (ref 4.0–10.5)

## 2014-01-30 LAB — COMPREHENSIVE METABOLIC PANEL
ALBUMIN: 2.8 g/dL — AB (ref 3.5–5.2)
ALK PHOS: 71 U/L (ref 39–117)
ALT: 22 U/L (ref 0–53)
AST: 18 U/L (ref 0–37)
Anion gap: 17 — ABNORMAL HIGH (ref 5–15)
BUN: 31 mg/dL — AB (ref 6–23)
CHLORIDE: 99 meq/L (ref 96–112)
CO2: 23 meq/L (ref 19–32)
Calcium: 8.3 mg/dL — ABNORMAL LOW (ref 8.4–10.5)
Creatinine, Ser: 1.19 mg/dL (ref 0.50–1.35)
GFR calc Af Amer: 62 mL/min — ABNORMAL LOW (ref >90)
GFR calc non Af Amer: 54 mL/min — ABNORMAL LOW (ref >90)
Glucose, Bld: 101 mg/dL — ABNORMAL HIGH (ref 70–99)
Potassium: 3.5 mEq/L — ABNORMAL LOW (ref 3.7–5.3)
Sodium: 139 mEq/L (ref 137–147)
Total Bilirubin: 2 mg/dL — ABNORMAL HIGH (ref 0.3–1.2)
Total Protein: 6.3 g/dL (ref 6.0–8.3)

## 2014-01-30 LAB — MAGNESIUM: Magnesium: 2.2 mg/dL (ref 1.5–2.5)

## 2014-01-30 MED ORDER — AMOXICILLIN-POT CLAVULANATE 875-125 MG PO TABS
1.0000 | ORAL_TABLET | Freq: Two times a day (BID) | ORAL | Status: DC
  Administered 2014-01-31 – 2014-02-03 (×9): 1 via ORAL
  Filled 2014-01-30 (×12): qty 1

## 2014-01-30 MED ORDER — BUDESONIDE 0.25 MG/2ML IN SUSP
0.2500 mg | Freq: Two times a day (BID) | RESPIRATORY_TRACT | Status: DC
  Administered 2014-01-30 – 2014-02-06 (×14): 0.25 mg via RESPIRATORY_TRACT
  Filled 2014-01-30 (×16): qty 2

## 2014-01-30 MED ORDER — AMIODARONE HCL 200 MG PO TABS
400.0000 mg | ORAL_TABLET | Freq: Two times a day (BID) | ORAL | Status: DC
  Administered 2014-01-30 – 2014-02-06 (×14): 400 mg via ORAL
  Filled 2014-01-30 (×16): qty 2

## 2014-01-30 MED ORDER — METOPROLOL TARTRATE 50 MG PO TABS
75.0000 mg | ORAL_TABLET | Freq: Three times a day (TID) | ORAL | Status: DC
  Administered 2014-01-30 – 2014-02-06 (×20): 75 mg via ORAL
  Filled 2014-01-30 (×23): qty 1

## 2014-01-30 MED ORDER — APIXABAN 5 MG PO TABS
5.0000 mg | ORAL_TABLET | Freq: Two times a day (BID) | ORAL | Status: DC
  Administered 2014-01-30 – 2014-02-06 (×13): 5 mg via ORAL
  Filled 2014-01-30 (×15): qty 1

## 2014-01-30 MED ORDER — TAMSULOSIN HCL 0.4 MG PO CAPS
0.4000 mg | ORAL_CAPSULE | Freq: Every day | ORAL | Status: DC
  Administered 2014-01-30 – 2014-02-05 (×7): 0.4 mg via ORAL
  Filled 2014-01-30 (×8): qty 1

## 2014-01-30 MED ORDER — FUROSEMIDE 10 MG/ML IJ SOLN
40.0000 mg | Freq: Every day | INTRAMUSCULAR | Status: DC
  Administered 2014-01-30 – 2014-02-01 (×3): 40 mg via INTRAVENOUS
  Filled 2014-01-30 (×3): qty 4

## 2014-01-30 NOTE — Consult Note (Signed)
Reason for Consult: Gross Hematuria with Clot Retention  Referring Physician: Barton Dubois MD  Richard Chavez is an 80 y.o. male.   HPI:   1 - Gross Hematuria / Minor Urethral Trauma- New gross hematuria with clots noted during admission for CHF exacerbation and pneumonia. He is on Eliquus for Atrial Fibrillation primary prevention. Per nursing had catheter in for UOP monitoring during ICU stay and had episode of tugging on catheter after which blood and clot noted 9/25. Catheter exchanged 9/25 and irrigated some by nursing team. Urology consultation sought 9/26. Bedside cysto today (9/26) with minor bulbar, and prostatic urethral trauma, no tumors, some residual small clot that then irrigated to clear via new 21F coude catheter to clear.   2 - Urinary Retention / Enlarged Prostate - s/p TURP 2013 by Gaynelle Arabian, path all benign. At baseline denies recurrent retention.   3 - Renal Insufficiency - Cr 1.2-1.5 for many years. Prior renal US with medical renal disease.   Today Richard Chavez is seen in consultation for above. He is referred by Dr. Dyann Kief. His primary urologist is Dr. Gaynelle Arabian.  He is presently admitted for CHF exacerbatino and pneumonia.   Past Medical History  Diagnosis Date  . CAD (coronary artery disease), native coronary artery     Status post CABG  . Hypertension   . Hyperlipidemia   . GERD (gastroesophageal reflux disease)   . COPD (chronic obstructive pulmonary disease)   . CRF (chronic renal failure)   . Barrett's esophagus   . OP (osteoporosis)   . CAD (coronary artery disease) of artery bypass graft 07/04/2011    LIMA-LAD; SVG-OM2-OM3, SVG-RCA -- all patent as of 05/27/11  . Syncope 07/04/2011  . Pacemaker 07/04/2011  . Cardiomyopathy, ischemic 07/2011; 09/2012    2D Echo - EF 35-40, mod dilated LA; Relook Echo 09/2012: EF 40-45%  . Myocardial infarction     25 yrs ago  . Peripheral vascular disease   . Arthritis   . BPH (benign prostatic hyperplasia)   . Polyp of colon      removed  . Sleep apnea     STOP BANG SCORE 4  . AV block, 3rd degree     Post [employer] Jude pacemaker, EF 35-40, does have intermittent atrial tachycardia, either PAt or Afib  . BPH (benign prostatic hypertrophy) 11/15/2011  . Paroxysmal atrial fibrillation - now rate controlled (formally with RVR and Hospital) 10/03/2012  . CVA (cerebral infarction) 10/09/2012    Noted to be in atrial fibrillation with evaluation of pacemaker, prior to CVA. Right-sided MCA stroke with left arm weakness/mild paralysis -- notably improved     Past Surgical History  Procedure Laterality Date  . Pacemaker placement  2007  . Cholecystectomy  2005  . Coronary artery bypass graft  1978    X3  . Lesion excision      from penis  . Cardiac catheterization  06/06/2011    occluded proximal RCA, ostial LAD and mid Circumflex after OM 1.; [place] LIMA-LAD, patent SVG-OM1-OM 2, patent SVG-RCA. Reduced ejection fraction of 30-35%.  . Cardiac catheterization  06/14/2010    LIMA to LAD, SVG to RCA, SVG to OM1 Circumflex, 100% occluded RCA, 100% occluded circumfkex, 100% occluded LAD, no evidence of graft dysfunction, continue medical therapy  . Cardiac catheterization  04/12/2003    3 vessel coronary artery disease, continue medical treatment  . Cardiac catheterization  10/07/2000    Severe 3 vessel coronary artery disease, continue medical treatment  . Doppler echocardiography  10/02/2012    EF 40-45%; possible grade 1 diastolic dysfunction, but A. fib precludes this. Mild atrial dilatation bilaterally. Moderately elevated pulmonary pressures of 40 mmHg.    Family History  Problem Relation Age of Onset  . Leukemia Father   . Heart disease Father   . Alzheimer's disease Mother   . Arthritis Mother   . Lung cancer Son 70  . Alzheimer's disease Maternal Grandmother     Social History:  reports that he quit smoking about 28 years ago. His smoking use included Cigarettes. He has a 4.5 pack-year smoking history. He has never  used smokeless tobacco. He reports that he does not drink alcohol or use illicit drugs.  Allergies: No Known Allergies  Medications: I have reviewed the patient's current medications.  Results for orders placed during the hospital encounter of 01/27/14 (from the past 48 hour(s))  BASIC METABOLIC PANEL     Status: Abnormal   Collection Time    01/29/14  3:10 AM      Result Value Ref Range   Sodium 139  137 - 147 mEq/L   Potassium 3.8  3.7 - 5.3 mEq/L   Chloride 98  96 - 112 mEq/L   CO2 25  19 - 32 mEq/L   Glucose, Bld 131 (*) 70 - 99 mg/dL   BUN 28 (*) 6 - 23 mg/dL   Creatinine, Ser 1.44 (*) 0.50 - 1.35 mg/dL   Calcium 8.3 (*) 8.4 - 10.5 mg/dL   GFR calc non Af Amer 43 (*) >90 mL/min   GFR calc Af Amer 50 (*) >90 mL/min   Comment: (NOTE)     The eGFR has been calculated using the CKD EPI equation.     This calculation has not been validated in all clinical situations.     eGFR's persistently <90 mL/min signify possible Chronic Kidney     Disease.   Anion gap 16 (*) 5 - 15  CBC WITH DIFFERENTIAL     Status: Abnormal   Collection Time    01/29/14  3:10 AM      Result Value Ref Range   WBC 13.9 (*) 4.0 - 10.5 K/uL   RBC 4.79  4.22 - 5.81 MIL/uL   Hemoglobin 13.8  13.0 - 17.0 g/dL   HCT 40.8  39.0 - 52.0 %   MCV 85.2  78.0 - 100.0 fL   MCH 28.8  26.0 - 34.0 pg   MCHC 33.8  30.0 - 36.0 g/dL   RDW 16.0 (*) 11.5 - 15.5 %   Platelets 163  150 - 400 K/uL   Neutrophils Relative % 82 (*) 43 - 77 %   Neutro Abs 11.5 (*) 1.7 - 7.7 K/uL   Lymphocytes Relative 10 (*) 12 - 46 %   Lymphs Abs 1.3  0.7 - 4.0 K/uL   Monocytes Relative 8  3 - 12 %   Monocytes Absolute 1.0  0.1 - 1.0 K/uL   Eosinophils Relative 0  0 - 5 %   Eosinophils Absolute 0.0  0.0 - 0.7 K/uL   Basophils Relative 0  0 - 1 %   Basophils Absolute 0.0  0.0 - 0.1 K/uL  COMPREHENSIVE METABOLIC PANEL     Status: Abnormal   Collection Time    01/30/14  4:32 AM      Result Value Ref Range   Sodium 139  137 - 147 mEq/L    Potassium 3.5 (*) 3.7 - 5.3 mEq/L   Chloride 99  96 -  112 mEq/L   CO2 23  19 - 32 mEq/L   Glucose, Bld 101 (*) 70 - 99 mg/dL   BUN 31 (*) 6 - 23 mg/dL   Creatinine, Ser 1.19  0.50 - 1.35 mg/dL   Calcium 8.3 (*) 8.4 - 10.5 mg/dL   Total Protein 6.3  6.0 - 8.3 g/dL   Albumin 2.8 (*) 3.5 - 5.2 g/dL   AST 18  0 - 37 U/L   ALT 22  0 - 53 U/L   Alkaline Phosphatase 71  39 - 117 U/L   Total Bilirubin 2.0 (*) 0.3 - 1.2 mg/dL   GFR calc non Af Amer 54 (*) >90 mL/min   GFR calc Af Amer 62 (*) >90 mL/min   Comment: (NOTE)     The eGFR has been calculated using the CKD EPI equation.     This calculation has not been validated in all clinical situations.     eGFR's persistently <90 mL/min signify possible Chronic Kidney     Disease.   Anion gap 17 (*) 5 - 15  CBC WITH DIFFERENTIAL     Status: Abnormal   Collection Time    01/30/14  4:32 AM      Result Value Ref Range   WBC 10.6 (*) 4.0 - 10.5 K/uL   RBC 4.84  4.22 - 5.81 MIL/uL   Hemoglobin 13.9  13.0 - 17.0 g/dL   HCT 42.2  39.0 - 52.0 %   MCV 87.2  78.0 - 100.0 fL   MCH 28.7  26.0 - 34.0 pg   MCHC 32.9  30.0 - 36.0 g/dL   RDW 16.3 (*) 11.5 - 15.5 %   Platelets 154  150 - 400 K/uL   Neutrophils Relative % 81 (*) 43 - 77 %   Neutro Abs 8.6 (*) 1.7 - 7.7 K/uL   Lymphocytes Relative 11 (*) 12 - 46 %   Lymphs Abs 1.2  0.7 - 4.0 K/uL   Monocytes Relative 7  3 - 12 %   Monocytes Absolute 0.8  0.1 - 1.0 K/uL   Eosinophils Relative 1  0 - 5 %   Eosinophils Absolute 0.1  0.0 - 0.7 K/uL   Basophils Relative 0  0 - 1 %   Basophils Absolute 0.0  0.0 - 0.1 K/uL  MAGNESIUM     Status: None   Collection Time    01/30/14  4:32 AM      Result Value Ref Range   Magnesium 2.2  1.5 - 2.5 mg/dL    No results found.  Review of Systems  Constitutional: Negative.  Negative for fever and chills.  HENT: Negative.   Eyes: Negative.   Respiratory: Positive for shortness of breath and wheezing.   Cardiovascular: Positive for leg swelling.   Gastrointestinal: Negative.   Genitourinary: Positive for hematuria. Negative for flank pain.  Musculoskeletal: Negative.   Skin: Negative.   Neurological: Negative.   Endo/Heme/Allergies: Negative.    Blood pressure 126/72, pulse 66, temperature 98.4 F (36.9 C), temperature source Oral, resp. rate 22, height _0  (1.88 m), weight 106.6 kg (235 lb 0.2 oz), SpO2 98.00%. Physical Exam  Constitutional: He appears well-developed and well-nourished.  [place], stigmata of chronic disease  HENT:  Head: Normocephalic.  Eyes: Pupils are equal, round, and reactive to light.  Neck: Normal range of motion. Neck supple.  Cardiovascular: Normal rate.   Respiratory: He has wheezes.  Wheezing, but non-labored on [place] O2  GI: Soft.  Genitourinary:  Dried blood around meatus and current catheter. Pink urine in bag  Musculoskeletal: Normal range of motion.  Neurological: He is alert.  AOx2, some dementia.   Skin: Skin is warm and dry.  Psychiatric: He has a normal mood and affect.   BEDSIDE CYSTOSCOPY AND IRRIGATION:  Current catheter removed. Using aseptic technique flexibly cystourethroscopy perforemd with 8F flexible cystoscopy. Moderate mucsal tear proximal pendulous through prostatic urethra w/o frank disruption or active bleeding. Prostatic fossa wide open. No tumors in mildly trabeculated bladder. Some small residual formed clot in bladder.  Cystosope then exchanged for new 2F foley and clot irrigated with 1000cc sterile water in 60cc aliquots to completely clear.   Assessment/Plan:   1 - Gross Hematuria - cysto today confirms mild urethral trauma as likely source. Now completely clear s/p complete clot irrigation. Rec keep current foley until at least 02/03/14 then may be removed if still in house or if discharged prior at Urology office [phone] for appt with Dr. Gaynelle Arabian). If hematuria persists / recurs would consider imaging.   2 - Urinary Retention / Enlarged Prostate -  Excellent anatomic result at this time s/p prior TURP.   3 - Renal Insufficiency - stable medical renal disease by serum labs this admission.    4 - Will follow prn while in house, please call with questions anytime.     Opaline Reyburn 01/30/2014, 4:46 PM

## 2014-01-30 NOTE — Progress Notes (Signed)
[name] foley continue to drain bloody urine with blood clots. Daphane Shepherd NP made aware. Will continue to monitor.

## 2014-01-30 NOTE — Progress Notes (Signed)
16 Fr Coude catheter inserted by rapid response nurse April Pugh, 650cc bloody urine drained.

## 2014-01-30 NOTE — Progress Notes (Signed)
Pharmacist Heart Failure Core Measure Documentation  Assessment: Richard Chavez has an EF documented as 20-25% on 01/27/14 by ECHO.  Rationale: Heart failure patients with left ventricular systolic dysfunction (LVSD) and an EF < 40% should be prescribed an angiotensin converting enzyme inhibitor (ACEI) or angiotensin receptor blocker (ARB) at discharge unless a contraindication is documented in the medical record.  This patient is not currently on an ACEI or ARB for HF.  This note is being placed in the record in order to provide documentation that a contraindication to the use of these agents is present for this encounter.  ACE Inhibitor or Angiotensin Receptor Blocker is contraindicated (specify all that apply)  []   ACEI allergy AND ARB allergy []   Angioedema []   Moderate or severe aortic stenosis []   Hyperkalemia []   Hypotension []   Renal artery stenosis [x]   Worsening renal function, preexisting renal disease or dysfunction   Forrester Blando, Tsz-Yin 01/30/2014 12:08 PM

## 2014-01-30 NOTE — Progress Notes (Signed)
Pt is restless, verbalized that he wanted to void, pt has foley. Bladder scan showed 741 ml, foley cath irrigated, 200cc of bloody urine drained. Elray Mcgregor NP notified and ordered to replace foley.

## 2014-01-30 NOTE — Progress Notes (Signed)
Progress Note  Richard Chavez ZOX:[id number] DOB: 19-Jun-1926 DOA: 01/27/2014 PCP: Thayer Headings, MD  Admit HPI / Brief Narrative: The patient is a 80 y/o WM PMHx  atrial fib with RVR (chronic on Eliquis), HTN, followed by Dr. Herbie Baltimore for significant cardiac history. Hx CAD with MI 25 years ago, s/p CABG x 4 with LIMA-LAD; SVG-OM2-OM3, SVG-RCA -- all patent as of 05/27/11, ischemic cardiomyopathy with a past EF of 35-40%, with improvement to 40-45% by 2D echo 09/2012. He has a pacemaker, followed by Dr. Royann Shivers. , CKD with baseline Scr of 1.8. In 2014, Rt sided stroke with residual left hand weakness.  Admitted to [facility] on 01/27/14 by Critical Care for CAP and acute CHF and was noted to be in atrial fibrillation with RVR.  On arrival to the ED yesterday, CXR revealed cardiomegaly and Infiltrate/ consolidation in right upper lobe highly suspicious for pneumonia. WBC was WNL on admit but now elevated at 17.6. BNP was elevated at 6726. Troponin negative x 3. He was hypokalemic with K at 3.4. Scr 1.79. EKG demonstrated Afib with ventricular response in the 130s. He was started on BiPap therapy, antibiotics and admitted to the ICU. He was placed on IV Cardizem. His home dose of Metoprolol, 100 mg BID was continued, as was Eliquis. A repeat 2D echo was obtained and reveals a notable decrease in systolic function compared to prior study with new EF at 20-25% (40-45% 09/2012). Diffuse hypokinesis was noted. Review of his home meds reveal that he had been on Verapamil, 180 mg daily.    HPI/Subjective: Patient is AAOX2, no CP. Feeling better; but is having mild labor breathing. No fever  Assessment/Plan: Acute Hypoxic Respiratory failure  -improved/resolving -after discussing with cardiology will minimize IVF's and start IV lasix  RUL PNA/ CAP (recurrent possible aspiration) -Continue antibiotics for a full eight-day course; but will now change to PO  COPD (not in exacerbation) -Chronic Prednisone Dependence(   QD) -Continue albuterol nebulizer PRN -Continue Spiriva daily -start pulmicort -Flutter valve q2hr while awake -continue Mucinex BID  OSA -does not wear CPAP  -Continue O2 to maintain SpO2 > 92%   A-Fib with RVR (chronic) - Per cardiology d/c amiodarone drip -Continue metoprolol ID -amiodarone  PO  Acute on Chronic CHF - LVEF now 20-25% on 9/23 echo  -cardiology on board and helping with management -Strict I&O -Daily weight -start lasix IV  daily  HTN  -stabel and controlled currently. -continue current regimen and low sodium diet  Hx CAD  -Cardiology following   -Continued Adjusted Eliquis dose for renal failure  -continue b-blocker -no CP   Chronic renal failure (baseline Cr 1.2-1.6)  -Patient Cr 1.19 (per GFR at baseline) -close monitoring while diuresing  Hypokalemia  -Monitor BMET  -Replace electrolytes as needed  -Potassium goal> 4  GERD /Hx Barrett's esophagus/Concern for Recurrent Aspiration  -S/P SLP evaluation; regular with thin liquid  -Continue Protonix 40 mg daily   Anemia  -Continue Eliquis at 1/2 dose adjusted renal dose -Monitor CBC  -Tx for Hgb < 8  Hx CVA - residual weakness to UE  -cognitive status back to baseline -continue eliquis for stroke prevention  BPH/Urinary retention and hematuria -urology consulted -Foley cath place -started on flomax -following Hgb; if needed ok from cardiology to hold on eliquis -will follow rec's  Mild dementia?  -Hold home Neurontin  -Minimize sedating medications   Code Status: FULL Family Communication: Son  present at time of exam Disposition Plan: to be determine  Consultants: -Dr. Leodis Sias (cardiology) -Dr. Rory Percy [facility] M.) -Dr. Berneice Heinrich (urology)   Procedure/Significant Events: 9/23 Echo> LVEF 20-25%, diffuse hypokinesis  Culture 9/23 blood right forearm/left antecubital NGTD 9/23 urine negative 9/23 Sputum; does not appear to have been drawn     9/23 MRSA by PCR negative   Antibiotics: Azithromycin 9/23>>9/26 Ceftriaxone 9/23>>9/26 augmentin 9/26 (will treat 4 more days)   DVT prophylaxis: Eliquis  Devices Pacer present right chest wall     Continuous Infusions: . sodium chloride 10 mL/hr at 01/28/14 0600    Objective: VITAL SIGNS: Temp: 98.4 F (36.9 C) (09/26 1400) Temp src: Oral (09/26 1400) BP: 126/72 mmHg (09/26 1400) Pulse Rate: 66 (09/26 1400)   Intake/Output Summary (Last 24 hours) at 01/30/14 1625 Last data filed at 01/30/14 1400  Gross per 24 hour  Intake 585.51 ml  Output   1350 ml  Net -764.49 ml     Exam: General: A./O. x4, NAD, mild labor breathing  Lungs: decreased BS at bases, fine crackles and positive wheezing  Cardiovascular: Irregular irregular rhythm and rate without murmur gallop or rub normal S1 and S2 Abdomen: Nontender, nondistended, soft, bowel sounds positive, no rebound, no ascites, no appreciable mass Extremities: No significant cyanosis, clubbing, or edema bilateral lower extremities Neurologic; cranial nerves 2-12 intact, pupils equal round reactive to light and accommodation, patient moves all extremities to command, sensation intact, did not ambulate patient  Data Reviewed: Basic Metabolic Panel:  Recent Labs Lab 01/27/14 1200 01/28/14 0258 01/29/14 0310 01/30/14 0432  NA 138 139 139 139  K 3.4* 3.5* 3.8 3.5*  CL 98 99 98 99  CO2 25 25 25 23   GLUCOSE 122* 156* 131* 101*  BUN 30* 28* 28* 31*  CREATININE 1.79* 1.56* 1.44* 1.19  CALCIUM 9.1 8.3* 8.3* 8.3*  MG  --   --   --  2.2   Liver Function Tests:  Recent Labs Lab 01/27/14 1200 01/30/14 0432  AST 22 18  ALT 35 22  ALKPHOS 55 71  BILITOT 2.1* 2.0*  PROT 6.3 6.3  ALBUMIN 3.5 2.8*   No results found for this basename: LIPASE, AMYLASE,  in the last 168 hours No results found for this basename: AMMONIA,  in the last 168 hours CBC:  Recent Labs Lab 01/27/14 0929 01/28/14 0258 01/29/14 0310  01/30/14 0432  WBC 10.2 17.6* 13.9* 10.6*  NEUTROABS  --   --  11.5* 8.6*  HGB 15.4 13.6 13.8 13.9  HCT 46.8 40.9 40.8 42.2  MCV 86.2 85.2 85.2 87.2  PLT 172 149* 163 154   Cardiac Enzymes:  Recent Labs Lab 01/27/14 1320 01/27/14 1934  TROPONINI <0.30 <0.30   BNP (last 3 results)  Recent Labs  04/01/13 2115 04/23/13 1221 01/27/14 0929  PROBNP 1693.0* 1516.00* 6726.0*   CBG:  Recent Labs Lab 01/27/14 1058 01/27/14 1558  GLUCAP 114* 211*    Recent Results (from the past 240 hour(s))  URINE CULTURE     Status: None   Collection Time    01/27/14  9:05 AM      Result Value Ref Range Status   Specimen Description URINE, CATHETERIZED   Final   Special Requests NONE   Final   Culture  Setup Time     Final   Value: 01/27/2014 14:30     Performed at [facility] Count     Final   Value: NO GROWTH     Performed at [facility]  Culture     Final   Value: NO GROWTH     Performed at [facility]   Report Status 01/28/2014 FINAL   Final  CULTURE, BLOOD (ROUTINE X 2)     Status: None   Collection Time    01/27/14  9:20 AM      Result Value Ref Range Status   Specimen Description BLOOD RIGHT FOREARM   Final   Special Requests BOTTLES DRAWN AEROBIC ONLY 10CC   Final   Culture  Setup Time     Final   Value: 01/27/2014 14:12     Performed at [facility]   Culture     Final   Value:        BLOOD CULTURE RECEIVED NO GROWTH TO DATE CULTURE WILL BE HELD FOR 5 DAYS BEFORE ISSUING A FINAL NEGATIVE REPORT     Performed at [facility]   Report Status PENDING   Incomplete  CULTURE, BLOOD (ROUTINE X 2)     Status: None   Collection Time    01/27/14  9:29 AM      Result Value Ref Range Status   Specimen Description BLOOD LEFT ANTECUBITAL   Final   Special Requests BOTTLES DRAWN AEROBIC ONLY 5CC   Final   Culture  Setup Time     Final   Value: 01/27/2014 14:12     Performed at [facility]   Culture     Final    Value:        BLOOD CULTURE RECEIVED NO GROWTH TO DATE CULTURE WILL BE HELD FOR 5 DAYS BEFORE ISSUING A FINAL NEGATIVE REPORT     Performed at [facility]   Report Status PENDING   Incomplete  MRSA PCR SCREENING     Status: None   Collection Time    01/27/14 11:05 AM      Result Value Ref Range Status   MRSA by PCR NEGATIVE  NEGATIVE Final   Comment:            The GeneXpert MRSA Assay (FDA     approved for NASAL specimens     only), is one component of a     comprehensive MRSA colonization     surveillance program. It is not     intended to diagnose MRSA     infection nor to guide or     monitor treatment for     MRSA infections.    Scheduled Meds:  Scheduled Meds: . amiodarone  400 mg Oral BID  . amoxicillin-clavulanate  1 tablet Oral Q12H  . apixaban  5 mg Oral BID  . budesonide (PULMICORT) nebulizer solution  0.25 mg Nebulization BID  . dextromethorphan-guaiFENesin  1 tablet Oral BID  . docusate sodium  100 mg Oral BID  . furosemide  40 mg Intravenous Daily  . metoprolol tartrate  75 mg Oral TID  . pantoprazole  40 mg Oral Q1200  . predniSONE  5 mg Oral Q breakfast  . tamsulosin  0.4 mg Oral QPC supper  . tiotropium  18 mcg Inhalation Daily    Time spent on care of this patient: 40 mins   Vassie Loll , MD  [phone]  If 7PM-7AM, please contact night-coverage [email or web address] Password [email or web address] 01/30/2014, 4:25 PM   LOS: 3 days

## 2014-01-30 NOTE — Progress Notes (Signed)
Primary cardiologist: Dr. [name] Lemma  Subjective:   Feels somewhat better. No sense of palpitations or chest pain.   Objective:   Temp:  [97.8 F (36.6 C)-98.6 F (37 C)] 98 F (36.7 C) (09/26 0506) Pulse Rate:  [80-129] 117 (09/26 0506) Resp:  [20-27] 20 (09/26 0506) BP: (91-133)/(66-96) 123/74 mmHg (09/26 0506) SpO2:  [95 %-100 %] 96 % (09/26 0756) FiO2 (%):  [21 %] 21 % (09/26 0756) Weight:  [234 lb 11.2 oz (106.459 kg)-235 lb 0.2 oz (106.6 kg)] 235 lb 0.2 oz (106.6 kg) (09/26 0506) Last BM Date: 01/29/14  Filed Weights   01/27/14 1100 01/29/14 1500 01/30/14 0506  Weight: 243 lb 13.3 oz (110.6 kg) 234 lb 11.2 oz (106.459 kg) 235 lb 0.2 oz (106.6 kg)    Intake/Output Summary (Last 24 hours) at 01/30/14 1121 Last data filed at 01/30/14 0900  Gross per 24 hour  Intake 465.51 ml  Output    650 ml  Net -184.49 ml    Telemetry: Atrial fibrillation.  Exam:  General: Obese, chronically ill-appearing.  Lungs: Decreased breath sounds, scattered crackles at the bases.  Cardiac: Irregularly irregular.  Extremities: Mild edema.   Lab Results:  Basic Metabolic Panel:  Recent Labs Lab 01/28/14 0258 01/29/14 0310 01/30/14 0432  NA 139 139 139  K 3.5* 3.8 3.5*  CL 99 98 99  CO2 GLUCOSE 156* 131* 101*  BUN 28* 28* 31*  CREATININE 1.56* 1.44* 1.19  CALCIUM 8.3* 8.3* 8.3*  MG  --   --  2.2    Liver Function Tests:  Recent Labs Lab 01/27/14 1200 01/30/14 0432  AST 22 18  ALT 35 22  ALKPHOS 55 71  BILITOT 2.1* 2.0*  PROT 6.3 6.3  ALBUMIN 3.5 2.8*    CBC:  Recent Labs Lab 01/28/14 0258 01/29/14 0310 01/30/14 0432  WBC 17.6* 13.9* 10.6*  HGB 13.6 13.8 13.9  HCT 40.9 40.8 42.2  MCV 85.2 85.2 87.2  PLT 149* 163 154    Cardiac Enzymes:  Recent Labs Lab 01/27/14 1320 01/27/14 1934  TROPONINI <0.30 <0.30    BNP:  Recent Labs  04/01/13 2115 04/23/13 1221 01/27/14 0929  PROBNP 1693.0* 1516.00* 6726.0*     Echocardiogram (01/27/14):  Study Conclusions  - Left ventricle: The cavity size was mildly dilated. Wall thickness was normal. Systolic function was severely reduced. The estimated ejection fraction was in the range of 20% to 25%. Diffuse hypokinesis. - Atrial septum: No defect or patent foramen ovale was identified.    Medications:   Scheduled Medications: . apixaban  5 mg Oral BID  . azithromycin  500 mg Oral Daily  . cefTRIAXone (ROCEPHIN)  IV  1 g Intravenous Q24H  . dextromethorphan-guaiFENesin  1 tablet Oral BID  . docusate sodium  100 mg Oral BID  . metoprolol tartrate  100 mg Oral BID  . mometasone-formoterol  2 puff Inhalation BID  . pantoprazole  40 mg Oral Q1200  . predniSONE  5 mg Oral Q breakfast  . tiotropium  18 mcg Inhalation Daily     Infusions: . sodium chloride 10 mL/hr at 01/28/14 0600  . amiodarone 30 mg/hr (01/30/14 0432)     PRN Medications:  albuterol, Influenza vac split quadrivalent PF, ondansetron (ZOFRAN) IV   Assessment:   1. Persistent atrial fibrillation, onset over 2 months ago by pacer interrogation. Working on heart rate control at this time. On high-dose Lopressor, IV amiodarone started by Dr. Elease Hashimoto.  2.  CAD status post previous CABG. No active angina.  3. Cardiomyopathy, LVEF 20-25% by most recent echocardiogram, diffuse hypokinesis. Represents decrease compared to prior testing, question tachycardia mediated. Had patent grafts in 2013. Consider outpatient ischemic testing once more stable.  4. History of complete heart block, status post St. Jude pacemaker.  5. Pneumonia with respiratory failure, per primary team.   Plan/Discussion:    Will change to oral amiodarone load to reduce fluid intake, also modify Lopressor to 75 mg 3 times a day. Continue on Eliquis. Resume Lasix.Jonelle Sidle, M.D., F.A.C.C.

## 2014-01-31 ENCOUNTER — Inpatient Hospital Stay (HOSPITAL_COMMUNITY): Payer: Medicare Other

## 2014-01-31 LAB — BASIC METABOLIC PANEL
Anion gap: 15 (ref 5–15)
BUN: 28 mg/dL — ABNORMAL HIGH (ref 6–23)
CALCIUM: 7.8 mg/dL — AB (ref 8.4–10.5)
CO2: 22 mEq/L (ref 19–32)
Chloride: 98 mEq/L (ref 96–112)
Creatinine, Ser: 1.01 mg/dL (ref 0.50–1.35)
GFR calc Af Amer: 76 mL/min — ABNORMAL LOW (ref >90)
GFR, EST NON AFRICAN AMERICAN: 66 mL/min — AB (ref >90)
Glucose, Bld: 93 mg/dL (ref 70–99)
Potassium: 3.8 mEq/L (ref 3.7–5.3)
SODIUM: 135 meq/L — AB (ref 137–147)

## 2014-01-31 LAB — CBC
HCT: 41.1 % (ref 39.0–52.0)
Hemoglobin: 13.5 g/dL (ref 13.0–17.0)
MCH: 27.8 pg (ref 26.0–34.0)
MCHC: 32.8 g/dL (ref 30.0–36.0)
MCV: 84.6 fL (ref 78.0–100.0)
PLATELETS: 127 10*3/uL — AB (ref 150–400)
RBC: 4.86 MIL/uL (ref 4.22–5.81)
RDW: 16 % — ABNORMAL HIGH (ref 11.5–15.5)
WBC: 7.8 10*3/uL (ref 4.0–10.5)

## 2014-01-31 MED ORDER — NYSTATIN 100000 UNIT/ML MT SUSP
5.0000 mL | Freq: Four times a day (QID) | OROMUCOSAL | Status: DC
  Administered 2014-01-31 – 2014-02-06 (×22): 500000 [IU] via ORAL
  Filled 2014-01-31 (×26): qty 5

## 2014-01-31 MED ORDER — POLYETHYLENE GLYCOL 3350 17 G PO PACK
17.0000 g | PACK | Freq: Every day | ORAL | Status: DC
  Administered 2014-01-31 – 2014-02-06 (×4): 17 g via ORAL
  Filled 2014-01-31 (×7): qty 1

## 2014-01-31 NOTE — Progress Notes (Signed)
Subjective:  1 - Gross Hematuria / Minor Urethral Trauma- New gross hematuria with clots noted during admission for CHF exacerbation and pneumonia. He is on Eliquus for Atrial Fibrillation primary prevention. Per nursing had catheter in for UOP monitoring during ICU stay and had episode of tugging on catheter after which blood and clot noted 9/25. Catheter exchanged 9/25 and irrigated some by nursing team. Urology consultation sought 9/26. Bedside cysto today (9/26) with minor bulbar, and prostatic urethral trauma, no tumors, some residual small clot that then irrigated to clear via new 20F coude catheter to clear.   2 - Urinary Retention / Enlarged Prostate - s/p TURP 2013 by Patsi Sears, path all benign. At baseline denies recurrent retention.   3 - Renal Insufficiency - Cr 1.2-1.5 for many years. Prior renal US with medical renal disease.   Today Richard Chavez is seen stable. No problems with catheter overnight, urine clearing while staying on PO anticoagulation.    Objective: Vital signs in last 24 hours: Temp:  [97.9 F (36.6 C)-98.7 F (37.1 C)] 97.9 F (36.6 C) (09/27 0623) Pulse Rate:  [54-108] 101 (09/27 0623) Resp:  [18-22] 18 (09/27 0623) BP: (104-126)/(43-72) 106/64 mmHg (09/27 0623) SpO2:  [93 %-100 %] 97 % (09/27 0754) FiO2 (%):  [21 %] 21 % (09/27 0754) Last BM Date: 01/30/14  Intake/Output from previous day: 09/26 0701 - 09/27 0700 In: 240 [P.O.:240] Out: 1550 [Urine:1550] Intake/Output this shift:    General appearance: alert, cooperative, appears stated age and mild distress Head: Normocephalic, without obvious abnormality, atraumatic Nose: Nares normal. Septum midline. Mucosa normal. No drainage or sinus tenderness. Throat: lips, mucosa, and tongue normal; teeth and gums normal Neck: supple, symmetrical, trachea midline Back: symmetric, no curvature. ROM normal. No CVA tenderness. Resp: diffuse wheezes and incresed WOB, stable.  Cardio: Nl rate GI: soft,  non-tender; bowel sounds normal; no masses,  no organomegaly Male genitalia: normal, catheter c/d/i with some blood around tip / meatus. urine in bag yellow and w/o gross blood or clots. Improved.  Extremities: extremities normal, atraumatic, no cyanosis or edema Pulses: 2+ and symmetric Skin: Skin color, texture, turgor normal. No rashes or lesions Lymph nodes: Cervical, supraclavicular, and axillary nodes normal.  Lab Results:   Recent Labs  01/30/14 0432 01/31/14 0450  WBC 10.6* 7.8  HGB 13.9 13.5  HCT 42.2 41.1  PLT 154 127*   BMET  Recent Labs  01/30/14 0432 01/31/14 0450  NA 139 135*  K 3.5* 3.8  CL 99 98  CO2 23 22  GLUCOSE 101* 93  BUN 31* 28*  CREATININE 1.19 1.01  CALCIUM 8.3* 7.8*   PT/INR No results found for this basename: LABPROT, INR,  in the last 72 hours ABG No results found for this basename: PHART, PCO2, PO2, HCO3,  in the last 72 hours  Studies/Results: Dg Chest 2 View  01/31/2014   CLINICAL DATA:  Shortness of breath and vascular congestion, followup, history hypertension, coronary artery disease post MI, chronic renal failure, COPD, GERD, ischemic cardiomyopathy  EXAM: CHEST  2 VIEW  COMPARISON:  01/28/2014  FINDINGS: RIGHT subclavian sequential pacemaker leads project at RIGHT atrium and RIGHT ventricle.  Enlargement of cardiac silhouette post CABG.  Atherosclerotic calcification aortic arch.  Stable mediastinal contours.  Persistent consolidation RIGHT upper lobe.  Pulmonary vascular congestion accentuation of pulmonary markings throughout remainder of lungs appear stable.  No additional infiltrate, pleural effusion or pneumothorax.  IMPRESSION: Persistent RIGHT upper lobe consolidation consistent with pneumonia.  Enlargement of cardiac silhouette post  pacemaker and CABG.   Electronically Signed   By: Ulyses Southward M.D.   On: 01/31/2014 08:27    Anti-infectives: Anti-infectives   Start     Dose/Rate Route Frequency Ordered Stop   01/30/14 2200   amoxicillin-clavulanate (AUGMENTIN) 875-125 MG per tablet 1 tablet     1 tablet Oral Every 12 hours 01/30/14 1623     01/30/14 1000  azithromycin (ZITHROMAX) tablet 500 mg  Status:  Discontinued     500 mg Oral Daily 01/29/14 1523 01/30/14 1623   01/27/14 1000  cefTRIAXone (ROCEPHIN) 1 g in dextrose 5 % 50 mL IVPB  Status:  Discontinued     1 g 100 mL/hr over 30 Minutes Intravenous Every 24 hours 01/27/14 0948 01/30/14 1623   01/27/14 1000  azithromycin (ZITHROMAX) 500 mg in dextrose 5 % 250 mL IVPB  Status:  Discontinued     500 mg 250 mL/hr over 60 Minutes Intravenous Every 24 hours 01/27/14 0948 01/29/14 1523   01/27/14 0945  cefTRIAXone (ROCEPHIN) 1 g in dextrose 5 % 50 mL IVPB  Status:  Discontinued     1 g 100 mL/hr over 30 Minutes Intravenous Every 24 hours 01/27/14 0944 01/27/14 0949   01/27/14 0945  azithromycin (ZITHROMAX) 500 mg in dextrose 5 % 250 mL IVPB  Status:  Discontinued     500 mg 250 mL/hr over 60 Minutes Intravenous Every 24 hours 01/27/14 0944 01/27/14 0949      Assessment/Plan:  1 - Gross Hematuria - improving with catheter drainage. Cysto this admission confirms mild urethral trauma as likely source. Rec keep current foley until at least 02/03/14 then may be removed if still in house or if discharged prior at Urology office [phone] for appt with Dr. Patsi Sears). If hematuria persists / recurs would consider imaging.   2 - Urinary Retention / Enlarged Prostate - Excellent anatomic result at this time s/p prior TURP.   3 - Renal Insufficiency - stable medical renal disease by serum labs this admission.   4 - Will follow prn while in house, please call with questions anytime.    [facility], Brizeida Mcmurry 01/31/2014

## 2014-01-31 NOTE — Progress Notes (Signed)
Primary cardiologist: Dr. [name] Lemma  Subjective:   Seems somewhat confused this morning. Wheezing. No chest pain or palpitations.   Objective:   Temp:  [97.9 F (36.6 C)-98.7 F (37.1 C)] 97.9 F (36.6 C) (09/27 0623) Pulse Rate:  [54-108] 101 (09/27 0623) Resp:  [18-22] 18 (09/27 0623) BP: (104-126)/(43-72) 106/64 mmHg (09/27 0623) SpO2:  [93 %-100 %] 97 % (09/27 0754) FiO2 (%):  [21 %] 21 % (09/27 0754) Last BM Date: 01/30/14  Filed Weights   01/27/14 1100 01/29/14 1500 01/30/14 0506  Weight: 243 lb 13.3 oz (110.6 kg) 234 lb 11.2 oz (106.459 kg) 235 lb 0.2 oz (106.6 kg)    Intake/Output Summary (Last 24 hours) at 01/31/14 0921 Last data filed at 01/31/14 0700  Gross per 24 hour  Intake    120 ml  Output   1550 ml  Net  -1430 ml    Telemetry: Atrial fibrillation, heart rate around 100.  Exam:  General: Obese, chronically ill-appearing.  Lungs: Decreased breath sounds, scattered crackles at the bases.  Cardiac: Irregularly irregular.  Extremities: Mild edema.   Lab Results:  Basic Metabolic Panel:  Recent Labs Lab 01/29/14 0310 01/30/14 0432 01/31/14 0450  NA 139 139 135*  K 3.8 3.5* 3.8  CL 98 99 98  CO2 GLUCOSE 131* 101* 93  BUN 28* 31* 28*  CREATININE 1.44* 1.19 1.01  CALCIUM 8.3* 8.3* 7.8*  MG  --  2.2  --     Liver Function Tests:  Recent Labs Lab 01/27/14 1200 01/30/14 0432  AST 22 18  ALT 35 22  ALKPHOS 55 71  BILITOT 2.1* 2.0*  PROT 6.3 6.3  ALBUMIN 3.5 2.8*    CBC:  Recent Labs Lab 01/29/14 0310 01/30/14 0432 01/31/14 0450  WBC 13.9* 10.6* 7.8  HGB 13.8 13.9 13.5  HCT 40.8 42.2 41.1  MCV 85.2 87.2 84.6  PLT 163 154 127*    Cardiac Enzymes:  Recent Labs Lab 01/27/14 1320 01/27/14 1934  TROPONINI <0.30 <0.30    BNP:  Recent Labs  04/01/13 2115 04/23/13 1221 01/27/14 0929  PROBNP 1693.0* 1516.00* 6726.0*    Echocardiogram (01/27/14):  Study Conclusions  - Left ventricle: The  cavity size was mildly dilated. Wall thickness was normal. Systolic function was severely reduced. The estimated ejection fraction was in the range of 20% to 25%. Diffuse hypokinesis. - Atrial septum: No defect or patent foramen ovale was identified.    Medications:   Scheduled Medications: . amiodarone  400 mg Oral BID  . amoxicillin-clavulanate  1 tablet Oral Q12H  . apixaban  5 mg Oral BID  . budesonide (PULMICORT) nebulizer solution  0.25 mg Nebulization BID  . dextromethorphan-guaiFENesin  1 tablet Oral BID  . docusate sodium  100 mg Oral BID  . furosemide  40 mg Intravenous Daily  . metoprolol tartrate  75 mg Oral TID  . pantoprazole  40 mg Oral Q1200  . predniSONE  5 mg Oral Q breakfast  . tamsulosin  0.4 mg Oral QPC supper  . tiotropium  18 mcg Inhalation Daily    Infusions: . sodium chloride 10 mL/hr at 01/28/14 0600    PRN Medications: albuterol, Influenza vac split quadrivalent PF, ondansetron (ZOFRAN) IV   Assessment:   1. Persistent atrial fibrillation, onset over 2 months ago by pacer interrogation. Working on heart rate control at this time. On high-dose Lopressor, IV amiodarone started by Dr. Elease Hashimoto - converted to oral yesterday.  2. CAD  status post previous CABG. No active angina.  3. Cardiomyopathy, LVEF 20-25% by most recent echocardiogram, diffuse hypokinesis. Represents decrease compared to prior testing, question tachycardia mediated. Had patent grafts in 2013. Consider outpatient ischemic testing once more stable. Lasix started back yesterday and patient diuresing.  4. History of complete heart block, status post St. Jude pacemaker.  5. Pneumonia with respiratory failure, per primary team. Remains limiting issue.   Plan/Discussion:    Will continue oral amiodarone load at this time. May need to eventually consider cardioversion as keeping him in sinus rhythm may help to reduce heart failure decompensations. Lopressor currently at 75 mg 3 times a  day, and heart rate control has improved somewhat. Continue on Eliquis. Watch intake and output.   Jonelle Sidle, M.D., F.A.C.C.

## 2014-01-31 NOTE — Progress Notes (Signed)
Progress Note  RAIF SARAFIN RSW:[id number] DOB: 1926/07/01 DOA: 01/27/2014 PCP: Thayer Headings, MD  Admit HPI / Brief Narrative: The patient is a 80 y/o WM PMHx  atrial fib with RVR (chronic on Eliquis), HTN, followed by Dr. Herbie Baltimore for significant cardiac history. Hx CAD with MI 25 years ago, s/p CABG x 4 with LIMA-LAD; SVG-OM2-OM3, SVG-RCA -- all patent as of 05/27/11, ischemic cardiomyopathy with a past EF of 35-40%, with improvement to 40-45% by 2D echo 09/2012. He has a pacemaker, followed by Dr. Royann Shivers. , CKD with baseline Scr of 1.8. In 2014, Rt sided stroke with residual left hand weakness.  Admitted to [facility] on 01/27/14 by Critical Care for CAP and acute CHF and was noted to be in atrial fibrillation with RVR.  On arrival to the ED yesterday, CXR revealed cardiomegaly and Infiltrate/ consolidation in right upper lobe highly suspicious for pneumonia. WBC was WNL on admit but now elevated at 17.6. BNP was elevated at 6726. Troponin negative x 3. He was hypokalemic with K at 3.4. Scr 1.79. EKG demonstrated Afib with ventricular response in the 130s. He was started on BiPap therapy, antibiotics and admitted to the ICU. He was placed on IV Cardizem. His home dose of Metoprolol, 100 mg BID was continued, as was Eliquis. A repeat 2D echo was obtained and reveals a notable decrease in systolic function compared to prior study with new EF at 20-25% (40-45% 09/2012). Diffuse hypokinesis was noted. Review of his home meds reveal that he had been on Verapamil, 180 mg daily.    HPI/Subjective: Patient is AAOX2, no CP. Feeling better and breathing easier today; still with some wheezing. No fever  Assessment/Plan: Acute Hypoxic Respiratory failure  -improving/resolving -after discussing with cardiology will continue IV lasix -daily weight, low sodium diet -continue abx's treatment for PNA  RUL PNA/ CAP (recurrent possible aspiration) -Continue antibiotics for a full eight-day course; using augmentin PO  now  COPD (not in exacerbation) -Chronic Prednisone Dependence( 5mg  QD) -Continue albuterol nebulizer PRN -Continue Spiriva daily -continue pulmicort -Flutter valve q2hr while awake -continue Mucinex BID  OSA -does not wear CPAP  -Continue O2 as needed to maintain SpO2 > 92%   A-Fib with RVR (chronic) - Per cardiology continue PO amiodarone a 400mg  -Continue metoprolol 75mg ID -rate is somewhat better  Acute on Chronic CHF - LVEF now 20-25% on 9/23 echo  -cardiology on board and helping with management -Strict I&O -Daily weight -continue lasix IV 40mg  daily  HTN  -stable and controlled currently. -continue current regimen and low sodium diet  Hx CAD  -Cardiology following   -Continued Adjusted Eliquis dose for renal failure  -continue b-blocker -no CP currently  Chronic renal failure (baseline Cr 1.2-1.6)  -Patient Cr 1.01 (per GFR essentially at baseline) -close monitoring while diuresing  Hypokalemia  -Monitor BMET  -Replace electrolytes as needed  -Potassium goal > 4 (will continue supplementation) -check Mg  GERD /Hx Barrett's esophagus/Concern for Recurrent Aspiration  -S/P SLP evaluation; regular with thin liquid  -Continue Protonix 40 mg daily   Anemia  -Continue Eliquis at 1/2 dose adjusted renal dose -Monitor CBC  -Tx for Hgb < 8  Hx CVA - residual weakness to UE  -cognitive status back to baseline -continue eliquis for stroke prevention  BPH/Urinary retention and hematuria -urology consulted -Foley cath place -started on flomax -following Hgb; significant clearing on urine and Hgb has remained stable -ok to continue eliquis -will follow rec's  Mild dementia?  -Holding home Neurontin  -Minimize sedating  medications   Code Status: FULL Family Communication: Son  present at time of exam Disposition Plan: to be determine    Consultants: -Dr. Leodis Sias (cardiology) -Dr. Rory Percy [facility] M.) -Dr. Berneice Heinrich  (urology)   Procedure/Significant Events: 9/23 Echo> LVEF 20-25%, diffuse hypokinesis  Culture 9/23 blood right forearm/left antecubital NGTD 9/23 urine negative 9/23 Sputum; does not appear to have been drawn   9/23 MRSA by PCR negative   Antibiotics: Azithromycin 9/23>>9/26 Ceftriaxone 9/23>>9/26 augmentin 9/26 (will treat 4 more days)   DVT prophylaxis: Eliquis  Devices Pacer present right chest wall     Continuous Infusions: . sodium chloride 10 mL/hr at 01/28/14 0600    Objective: VITAL SIGNS: Temp: 97.9 F (36.6 C) (09/27 1610) Temp src: Oral (09/27 0623) BP: 120/84 mmHg (09/27 0959) Pulse Rate: 114 (09/27 0959)   Intake/Output Summary (Last 24 hours) at 01/31/14 1237 Last data filed at 01/31/14 1131  Gross per 24 hour  Intake    240 ml  Output   1770 ml  Net  -1530 ml     Exam: General: A./O. x2, NAD, improvement in breathing; with sings of fluid overload  Lungs: decreased BS at bases, fine crackles and positive wheezing  Cardiovascular: Irregular irregular rhythm and rate without murmur gallop or rub normal S1 and S2 Abdomen: Nontender, nondistended, soft, bowel sounds positive, no rebound, no ascites, no appreciable mass Extremities: No significant cyanosis, clubbing, or edema bilateral lower extremities Neurologic; cranial nerves 2-12 intact, pupils equal round reactive to light and accommodation, patient moves all extremities to command, sensation intact, did not ambulate patient  Data Reviewed: Basic Metabolic Panel:  Recent Labs Lab 01/27/14 1200 01/28/14 0258 01/29/14 0310 01/30/14 0432 01/31/14 0450  NA 138 139 139 139 135*  K 3.4* 3.5* 3.8 3.5* 3.8  CL 98 99 98 99 98  CO2 GLUCOSE 122* 156* 131* 101* 93  BUN 30* 28* 28* 31* 28*  CREATININE 1.79* 1.56* 1.44* 1.19 1.01  CALCIUM 9.1 8.3* 8.3* 8.3* 7.8*  MG  --   --   --  2.2  --    Liver Function Tests:  Recent Labs Lab 01/27/14 1200 01/30/14 0432  AST 22  18  ALT 35 22  ALKPHOS 55 71  BILITOT 2.1* 2.0*  PROT 6.3 6.3  ALBUMIN 3.5 2.8*   CBC:  Recent Labs Lab 01/27/14 0929 01/28/14 0258 01/29/14 0310 01/30/14 0432 01/31/14 0450  WBC 10.2 17.6* 13.9* 10.6* 7.8  NEUTROABS  --   --  11.5* 8.6*  --   HGB 15.4 13.6 13.8 13.9 13.5  HCT 46.8 40.9 40.8 42.2 41.1  MCV 86.2 85.2 85.2 87.2 84.6  PLT 172 149* 163 154 127*   Cardiac Enzymes:  Recent Labs Lab 01/27/14 1320 01/27/14 1934  TROPONINI <0.30 <0.30   BNP (last 3 results)  Recent Labs  04/01/13 2115 04/23/13 1221 01/27/14 0929  PROBNP 1693.0* 1516.00* 6726.0*   CBG:  Recent Labs Lab 01/27/14 1058 01/27/14 1558  GLUCAP 114* 211*    Recent Results (from the past 240 hour(s))  URINE CULTURE     Status: None   Collection Time    01/27/14  9:05 AM      Result Value Ref Range Status   Specimen Description URINE, CATHETERIZED   Final   Special Requests NONE   Final   Culture  Setup Time     Final   Value: 01/27/2014 14:30     Performed at [facility]   Colony Count     Final   Value: NO GROWTH     Performed at [facility]   [employer]ue: NO GROWTH     Performed at [facility]   Report Status 01/28/2014 FINAL   Final  CULTURE, BLOOD (ROUTINE X 2)     Status: None   Collection Time    01/27/14  9:20 AM      Result Value Ref Range Status   Specimen Description BLOOD RIGHT FOREARM   Final   Special Requests BOTTLES DRAWN AEROBIC ONLY 10CC   Final   Culture  Setup Time     Final   Value: 01/27/2014 14:12     Performed at [facility]   Culture     Final   Value:        BLOOD CULTURE RECEIVED NO GROWTH TO DATE CULTURE WILL BE HELD FOR 5 DAYS BEFORE ISSUING A FINAL NEGATIVE REPORT     Performed at [facility]   Report Status PENDING   Incomplete  CULTURE, BLOOD (ROUTINE X 2)     Status: None   Collection Time    01/27/14  9:29 AM      Result Value Ref Range Status   Specimen Description BLOOD LEFT  ANTECUBITAL   Final   Special Requests BOTTLES DRAWN AEROBIC ONLY 5CC   Final   Culture  Setup Time     Final   Value: 01/27/2014 14:12     Performed at [facility]   Culture     Final   Value:        BLOOD CULTURE RECEIVED NO GROWTH TO DATE CULTURE WILL BE HELD FOR 5 DAYS BEFORE ISSUING A FINAL NEGATIVE REPORT     Performed at [facility]   Report Status PENDING   Incomplete  MRSA PCR SCREENING     Status: None   Collection Time    01/27/14 11:05 AM      Result Value Ref Range Status   MRSA by PCR NEGATIVE  NEGATIVE Final   Comment:            The GeneXpert MRSA Assay (FDA     approved for NASAL specimens     only), is one component of a     comprehensive MRSA colonization     surveillance program. It is not     intended to diagnose MRSA     infection nor to guide or     monitor treatment for     MRSA infections.    Scheduled Meds:  Scheduled Meds: . amiodarone  400 mg Oral BID  . amoxicillin-clavulanate  1 tablet Oral Q12H  . apixaban  5 mg Oral BID  . budesonide (PULMICORT) nebulizer solution  0.25 mg Nebulization BID  . dextromethorphan-guaiFENesin  1 tablet Oral BID  . docusate sodium  100 mg Oral BID  . furosemide  40 mg Intravenous Daily  . metoprolol tartrate  75 mg Oral TID  . pantoprazole  40 mg Oral Q1200  . polyethylene glycol  17 g Oral Daily  . predniSONE  5 mg Oral Q breakfast  . tamsulosin  0.4 mg Oral QPC supper  . tiotropium  18 mcg Inhalation Daily    Time spent on care of this patient: 40 mins   Vassie Loll , MD  [phone]  If 7PM-7AM, please contact night-coverage [email or web address] Password [facility] 01/31/2014, 12:37 PM  LOS: 4 days

## 2014-02-01 MED ORDER — FUROSEMIDE 10 MG/ML IJ SOLN
40.0000 mg | Freq: Two times a day (BID) | INTRAMUSCULAR | Status: DC
  Administered 2014-02-01 – 2014-02-03 (×5): 40 mg via INTRAVENOUS
  Filled 2014-02-01 (×8): qty 4

## 2014-02-01 NOTE — Progress Notes (Signed)
Heart Failure Navigator Consult Note  Presentation: Richard Chavez  is an 80 y/o WM, previous smoker, with a PMH of HTN,CHF (EF 40-45% 09/2012), COPD, OSA, GERD, CVA with residual dominant hand weakness, Afib (on Eliquis), s/p pacemaker placement and CABG who presented to the [facility] ED via EMS with complaints of worsening SOB and congestion. Patient was placed on BiPAP on presentation. Information obtained from his son at bedside. Son reports that his SOB and cough/congestion started acutely the morning of presentation, 9/23, and he had no previous complaints of same in the preceding days. The cough is non-productive per son. Richard Chavez saw his PCP, Richard Chavez at [facility], 9/15 for increasing B LE edema x 1 month. He also saw Richard Chavez (cardiology) who increased his lasix and metoprolol doses. He has lost 7 lbs since the increase in lasix due to fluid loss but still feels that he is volume up from baseline (estimates baseline weight ~230 lbs)  The patient lives with his son and performs most ADL's, including driving, independently. His son does help prepare meals as he continues to have UE weakness after his CVA. He sleeps with the head of the bed elevated due to orthopnea. He has OSA, but does not use CPAP. [name] son reports pt has had no fever or chills, no N/V/D. He does not notice his father having difficulty swallowing food or drink or coughing during/after meals. He does not lie down directly after meals. He does have a history of PNA 4 times in the last year.   Past Medical History  Diagnosis Date  . CAD (coronary artery disease), native coronary artery     Status post CABG  . Hypertension   . Hyperlipidemia   . GERD (gastroesophageal reflux disease)   . COPD (chronic obstructive pulmonary disease)   . CRF (chronic renal failure)   . Barrett's esophagus   . OP (osteoporosis)   . CAD (coronary artery disease) of artery bypass graft 07/04/2011    LIMA-LAD; SVG-OM2-OM3, SVG-RCA -- all patent  as of 05/27/11  . Syncope 07/04/2011  . Pacemaker 07/04/2011  . Cardiomyopathy, ischemic 07/2011; 09/2012    2D Echo - EF 35-40, mod dilated LA; Relook Echo 09/2012: EF 40-45%  . Myocardial infarction     25 yrs ago  . Peripheral vascular disease   . Arthritis   . BPH (benign prostatic hyperplasia)   . Polyp of colon     removed  . Sleep apnea     STOP BANG SCORE 4  . AV block, 3rd degree     Post St. Jude pacemaker, EF 35-40, does have intermittent atrial tachycardia, either PAt or Afib  . BPH (benign prostatic hypertrophy) 11/15/2011  . Paroxysmal atrial fibrillation - now rate controlled (formally with RVR and Hospital) 10/03/2012  . CVA (cerebral infarction) 10/09/2012    Noted to be in atrial fibrillation with evaluation of pacemaker, prior to CVA. Right-sided MCA stroke with left arm weakness/mild paralysis -- notably improved     History   Social History  . Marital Status: Widowed    Spouse Name: N/A    Number of Children: 1  . Years of Education: 8   Occupational History  . RETIRED [employer]   Social History Main Topics  . Smoking status: Former Smoker -- 0.30 packs/day for 15 years    Types: Cigarettes    Quit date: 05/07/1985  . Smokeless tobacco: Never Used  . Alcohol Use: No  . Drug Use: No  .  Sexual Activity: No   Other Topics Concern  . None   Social History Narrative   Patient's son Richard Chavez lives with him at home.   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    ECHO:Study Conclusions--01/27/14  - Left ventricle: The cavity size was mildly dilated. Wall thickness was normal. Systolic function was severely reduced. The estimated ejection fraction was in the range of 20% to 25%. Diffuse hypokinesis. - Atrial septum: No defect or patent  foramen ovale was identified.  ------------------------------------------------------------------- Labs, prior tests, procedures, and surgery: Permanent pacemaker system implantation.  Transthoracic echocardiography. M-mode, complete 2D, spectral Doppler, and color Doppler. Birthdate: Patient birthdate: 31-Aug-1926. Age: Patient is 80 yr old. Sex: Gender: male. BMI: 27.4 kg/m^2. Blood pressure: 121/64 Patient status: Inpatient. Study date: Study date: 01/27/2014. Study time: 02:35 PM. Location: Bedside.   BNP    Component Value Date/Time   PROBNP 6726.0* 01/27/2014 0929    Education Assessment and Provision:  Detailed education and instructions provided on heart failure disease management including the following:  Signs and symptoms of Heart Failure When to call the physician Importance of daily weights Low sodium diet Fluid restriction Medication management Anticipated future follow-up appointments  Patient education given on each of the above topics.  Patient acknowledges understanding and acceptance of all instructions.  I spoke for some time with patient and son regarding his HF.  The son says that he has been fairly independent until recently. He has been driving and "out".  The son does do most of the cooking.  They both say that he eats mostly "low sodium".  He does not weigh daily and we did discuss the importance of that.  I also reinforced signs and symptoms of HF and when to call the physician.  He seems very weak .   I question as to whether or not he remains able to return to home even with the assistance of his son?  Await PT eval.  Education Materials:  "Living Better With Heart Failure" Booklet, Daily Weight Tracker Tool   High Risk Criteria for Readmission and/or Poor Patient Outcomes:   EF <30%-  Yes 20-25%  2 or more admissions in 6 months- No  Difficult social situation- No--son cares for at home  Demonstrates medication noncompliance- No    Barriers  of Care:  Knowledge and compliance, son's ability to assist him to remain compliant   Discharge Planning:   Currently plans to discharge with son to home.  He will benefit from [facility] and HHPT.  He will follow up with [facility]--reports that he sees Richard Chavez.

## 2014-02-01 NOTE — Progress Notes (Signed)
UR completed Tihanna Goodson K. Viki Carrera, RN, BSN, MSHL, CCM  02/01/2014 3:56 PM

## 2014-02-01 NOTE — Progress Notes (Signed)
PT Cancellation Note  Patient Details Name: Richard Chavez MRN: [id number] DOB: 30-Jan-1927   Cancelled Treatment:    Reason Eval/Treat Not Completed: Other (comment) (attempted to see, pt just got lunch)   [facility] 02/01/2014, 1:00 PM

## 2014-02-01 NOTE — Progress Notes (Signed)
Patient Name: Richard Chavez Date of Encounter: 02/01/2014     Active Problems:   Pneumonia   Acute respiratory failure   Atrial fibrillation with RVR   Acute on chronic systolic HF (heart failure)    SUBJECTIVE  generally not feeling well. Multiple complaints. + SOB, CP, orthopnea, PND LE  edema  CURRENT MEDS . amiodarone  400 mg Oral BID  . amoxicillin-clavulanate  1 tablet Oral Q12H  . apixaban  5 mg Oral BID  . budesonide (PULMICORT) nebulizer solution  0.25 mg Nebulization BID  . dextromethorphan-guaiFENesin  1 tablet Oral BID  . docusate sodium  100 mg Oral BID  . furosemide  40 mg Intravenous Daily  . metoprolol tartrate  75 mg Oral TID  . nystatin  5 mL Oral QID  . pantoprazole  40 mg Oral Q1200  . polyethylene glycol  17 g Oral Daily  . predniSONE  5 mg Oral Q breakfast  . tamsulosin  0.4 mg Oral QPC supper  . tiotropium  18 mcg Inhalation Daily    OBJECTIVE  Filed Vitals:   02/01/14 0500 02/01/14 0554 02/01/14 0818 02/01/14 0918  BP: 127/69   134/93  Pulse: 109     Temp: 97.8 F (36.6 C)   98.8 F (37.1 C)  TempSrc: Oral   Oral  Resp: 18   18  Height:      Weight:  229 lb 8 oz (104.1 kg)    SpO2: 96%  96% 97%    Intake/Output Summary (Last 24 hours) at 02/01/14 1231 Last data filed at 02/01/14 0500  Gross per 24 hour  Intake      0 ml  Output    775 ml  Net   -775 ml   Filed Weights   01/29/14 1500 01/30/14 0506 02/01/14 0554  Weight: 234 lb 11.2 oz (106.459 kg) 235 lb 0.2 oz (106.6 kg) 229 lb 8 oz (104.1 kg)    PHYSICAL EXAM  General: A./O. x2, NAD, improvement in breathing Lungs: decreased BS at bases, fine crackles and positive wheezing  Cardiovascular: Irregular irregular rhythm and rate without murmur gallop or rub normal S1 and S2  Abdomen: Nontender, nondistended, soft, bowel sounds positive, no rebound, no ascites, no appreciable mass  Extremities: No significant cyanosis, clubbing/ 1 +  edema bilateral lower extremities    Neurologic; cranial nerves 2-12 intact, pupils equal round reactive to light and accommodation, patient moves all extremities to command, sensation intact   Accessory Clinical Findings  CBC  Recent Labs  01/30/14 0432 01/31/14 0450  WBC 10.6* 7.8  NEUTROABS 8.6*  --   HGB 13.9 13.5  HCT 42.2 41.1  MCV 87.2 84.6  PLT 154 127*   Basic Metabolic Panel  Recent Labs  01/30/14 0432 01/31/14 0450  NA 139 135*  K 3.5* 3.8  CL 99 98  CO2 23 22  GLUCOSE 101* 93  BUN 31* 28*  CREATININE 1.19 1.01  CALCIUM 8.3* 7.8*  MG 2.2  --    Liver Function Tests  Recent Labs  01/30/14 0432  AST 18  ALT 22  ALKPHOS 71  BILITOT 2.0*  PROT 6.3  ALBUMIN 2.8*    TELE  afib with rate control   Radiology/Studies  Dg Chest 2 View  01/31/2014   CLINICAL DATA:  Shortness of breath and vascular congestion, followup, history hypertension, coronary artery disease post MI, chronic renal failure, COPD, GERD, ischemic cardiomyopathy  EXAM: CHEST  2 VIEW  COMPARISON:  01/28/2014  FINDINGS: RIGHT subclavian sequential pacemaker leads project at RIGHT atrium and RIGHT ventricle.  Enlargement of cardiac silhouette post CABG.  Atherosclerotic calcification aortic arch.  Stable mediastinal contours.  Persistent consolidation RIGHT upper lobe.  Pulmonary vascular congestion accentuation of pulmonary markings throughout remainder of lungs appear stable.  No additional infiltrate, pleural effusion or pneumothorax.  IMPRESSION: Persistent RIGHT upper lobe consolidation consistent with pneumonia.  Enlargement of cardiac silhouette post pacemaker and CABG.   Electronically Signed   By: Ulyses Southward M.D.   On: 01/31/2014 08:27   Dg Chest Port 1 View  01/28/2014   CLINICAL DATA:  Evaluate pneumonia  EXAM: PORTABLE CHEST - 1 VIEW  COMPARISON:  01/27/2014  FINDINGS: Persistent right upper lobe pneumonia is identified although mild increase in aeration in the right upper lobe is noted. The lungs are otherwise  within normal limits. Postsurgical changes are seen. Cardiac shadow is stable. No acute bony abnormality is noted. A pacing device is again seen.  IMPRESSION: Persistent but mildly improved right upper lobe pneumonia.   Electronically Signed   By: Alcide Clever M.D.   On: 01/28/2014 07:05   Dg Chest Portable 1 View  01/27/2014   CLINICAL DATA:  History of CVA  EXAM: PORTABLE CHEST - 1 VIEW  COMPARISON:  04/01/2013  FINDINGS: Cardiomegaly is noted. Dual lead cardiac pacemaker is unchanged in position. Status post median sternotomy. There is infiltrate/ consolidation in right upper lobe highly suspicious for pneumonia. Follow-up to resolution is recommended. No pulmonary edema.  IMPRESSION: Cardiomegaly. Dual lead cardiac pacemaker in place. Infiltrate/ consolidation in right upper lobe highly suspicious for pneumonia. Follow-up to resolution is recommended.   Electronically Signed   By: Natasha Mead M.D.   On: 01/27/2014 08:59    ASSESSMENT AND PLAN  Richard Chavez is a 80 y.o. male with a history of A-Fib, CAD s/p CABG, chronic systolic CHF, HTN, CKD, and CHB s/p PPM who was admitted on 01/27/14 with dyspnea and was diagnosed with pneumonia. Pacer was interrogated and he was found to have AFib for the past 79 days - did not appear to be related to the acute illness.   Persistent atrial fibrillation, onset over 2 months ago by pacer interrogation. Working on heart rate control at this time. On high-dose Lopressor, IV amiodarone started by Dr. Elease Hashimoto - converted to oral 01/30/14. -- Continue Eliquis  BID, IV Lasix  qd, Lopressor  TID, amiodarone  BID. -- HR relatively well controlled ~100 currently.  CAD status post previous CABG. No active angina.  -- Troponin neg  Cardiomyopathy/CHF, LVEF 20-25% by most recent echocardiogram, diffuse hypokinesis. Represents decrease compared to prior testing, question tachycardia mediated. Had patent grafts in 2013. Consider outpatient ischemic testing  once more stable. Lasix started back yesterday and patient diuresing.  -- Lasix  qd. Net neg 2.75L Weight down 14lbs. Still volume overloaded on exam and SOB. Consider increasing Lasix to  BID. Creat 1.01 and BP 134/93 today.   History of complete heart block, status post St. Jude pacemaker.   Pneumonia with respiratory failure, per primary team. Remains limiting issue. -- White count resolving -- CXR yesterday with persistent RIGHT upper lobe consolidation consistent with pneumonia.    Billy Fischer PA-C  Pager [phone]  Personally seen and examined. Agree with above. Increasing lasix to 40 IV BID  Donato Schultz, MD

## 2014-02-01 NOTE — Progress Notes (Signed)
Progress Note  DRAPER VALLIS HKG:[id number] DOB: 26-Mar-1927 DOA: 01/27/2014 PCP: Thayer Headings, MD  Admit HPI / Brief Narrative: The patient is a 80 y/o WM PMHx  atrial fib with RVR (chronic on Eliquis), HTN, followed by Dr. Herbie Baltimore for significant cardiac history. Hx CAD with MI 25 years ago, s/p CABG x 4 with LIMA-LAD; SVG-OM2-OM3, SVG-RCA -- all patent as of 05/27/11, ischemic cardiomyopathy with a past EF of 35-40%, with improvement to 40-45% by 2D echo 09/2012. He has a pacemaker, followed by Dr. Royann Shivers. , CKD with baseline Scr of 1.8. In 2014, Rt sided stroke with residual left hand weakness.  Admitted to [facility] on 01/27/14 by Critical Care for CAP and acute CHF and was noted to be in atrial fibrillation with RVR.  On arrival to the ED yesterday, CXR revealed cardiomegaly and Infiltrate/ consolidation in right upper lobe highly suspicious for pneumonia. WBC was WNL on admit but now elevated at 17.6. BNP was elevated at 6726. Troponin negative x 3. He was hypokalemic with K at 3.4. Scr 1.79. EKG demonstrated Afib with ventricular response in the 130s. He was started on BiPap therapy, antibiotics and admitted to the ICU. He was placed on IV Cardizem. His home dose of Metoprolol, 100 mg BID was continued, as was Eliquis. A repeat 2D echo was obtained and reveals a notable decrease in systolic function compared to prior study with new EF at 20-25% (40-45% 09/2012). Diffuse hypokinesis was noted. Now actively receiving diuresis   HPI/Subjective: Patient is Afebrile; still complaining of SOB, wheezing and orthopnea. No fever or CP.   Assessment/Plan: Acute Hypoxic Respiratory failure  -improving/resolving -after discussing with cardiology will continue IV lasix and will increase it to 40mg  BID -daily weight, low sodium diet -continue abx's treatment for PNA  RUL PNA/ CAP (recurrent possible aspiration) -Continue antibiotics for a full eight-day course; using augmentin PO now -day 6/8 -no fever,  WBC's WNL -CXR demonstrating consolidation; but might be abnormal up to 4 weeks, w/o representing active disease.  COPD (not in exacerbation) -Chronic Prednisone Dependence( 5mg  QD) -Continue albuterol nebulizer PRN -Continue Spiriva daily -continue pulmicort -Flutter valve q2hr while awake -continue Mucinex BID  OSA -does not wear CPAP  -Continue O2 as needed to maintain SpO2 > 92%   A-Fib with RVR (chronic) - Per cardiology continue PO amiodarone a 400mg  -Continue metoprolol 75mg ID -rate is somewhat better  Acute on Chronic CHF - LVEF now 20-25% on 9/23 echo  -cardiology on board and helping with management -Strict I&O -Daily weight -continue lasix IV 40mg  daily  HTN  -stable and controlled currently. -continue current regimen and low sodium diet  Hx CAD  -Cardiology following   -Continued Adjusted Eliquis dose for renal failure  -continue b-blocker -no CP currently  Chronic renal failure (baseline Cr 1.2-1.6)  -Patient Cr 1.01 (per GFR essentially at baseline) -close monitoring while diuresing  Hypokalemia  -Monitor BMET  -Replace electrolytes as needed  -Potassium goal > 4 (will continue supplementation) -will also follow Mg  GERD /Hx Barrett's esophagus/Concern for Recurrent Aspiration  -S/P SLP evaluation; regular with thin liquid  -Continue Protonix 40 mg daily   Anemia  -Continue Eliquis dose adjusted for renal function -Monitor CBC  -Tx for Hgb < 8  Hx CVA - residual weakness to UE  -cognitive status back to baseline -continue eliquis for stroke prevention  BPH/Urinary retention and hematuria -urology on  board -Foley cath place; plan is to keep it in until 9/30 and at that time performed voiding  trial -continue flomax -Hgb stable; urine is now clear -ok to continue eliquis -will follow rec's  Mild dementia?  -Holding home Neurontin  -Minimize sedating medications   Code Status: FULL Family Communication: Son  present at time of  exam Disposition Plan: to be determine    Consultants: -Dr. Leodis Sias (cardiology) -Dr. Rory Percy [facility] M.) -Dr. Berneice Heinrich (urology)   Procedure/Significant Events: 9/23 Echo> LVEF 20-25%, diffuse hypokinesis  Culture 9/23 blood right forearm/left antecubital NGTD 9/23 urine negative 9/23 Sputum; does not appear to have been drawn   9/23 MRSA by PCR negative   Antibiotics: Azithromycin 9/23>>9/26 Ceftriaxone 9/23>>9/26 augmentin 9/26 (will treat 4 more days)   DVT prophylaxis: Eliquis  Devices Pacer present right chest wall     Continuous Infusions: . sodium chloride 10 mL/hr at 01/28/14 0600    Objective: VITAL SIGNS: Temp: 97.9 F (36.6 C) (09/28 1455) Temp src: Oral (09/28 1455) BP: 126/64 mmHg (09/28 1455) Pulse Rate: 90 (09/28 1455)   Intake/Output Summary (Last 24 hours) at 02/01/14 1529 Last data filed at 02/01/14 1300  Gross per 24 hour  Intake    360 ml  Output   1876 ml  Net  -1516 ml     Exam: General: afebrile; denies CP; complaining of SOB and not feeling well. Patient weak and tired.  Lungs: decreased BS at bases, fine crackles and positive exp wheezing  Cardiovascular: Irregular irregular rhythm and rate without murmur gallop or rub normal S1 and S2 Abdomen: Nontender, nondistended, soft, bowel sounds positive, no rebound, no ascites, no appreciable mass Extremities: No significant cyanosis or clubbing; 2++ LE edema bilaterally (Pedal to mid shin)  Neurologic; cranial nerves 2-12 intact, pupils equal round reactive to light and accommodation, patient moves all extremities to command, sensation intact.  Data Reviewed: Basic Metabolic Panel:  Recent Labs Lab 01/27/14 1200 01/28/14 0258 01/29/14 0310 01/30/14 0432 01/31/14 0450  NA 138 139 139 139 135*  K 3.4* 3.5* 3.8 3.5* 3.8  CL 98 99 98 99 98  CO2 GLUCOSE 122* 156* 131* 101* 93  BUN 30* 28* 28* 31* 28*  CREATININE 1.79* 1.56* 1.44* 1.19 1.01   CALCIUM 9.1 8.3* 8.3* 8.3* 7.8*  MG  --   --   --  2.2  --    Liver Function Tests:  Recent Labs Lab 01/27/14 1200 01/30/14 0432  AST 22 18  ALT 35 22  ALKPHOS 55 71  BILITOT 2.1* 2.0*  PROT 6.3 6.3  ALBUMIN 3.5 2.8*   CBC:  Recent Labs Lab 01/27/14 0929 01/28/14 0258 01/29/14 0310 01/30/14 0432 01/31/14 0450  WBC 10.2 17.6* 13.9* 10.6* 7.8  NEUTROABS  --   --  11.5* 8.6*  --   HGB 15.4 13.6 13.8 13.9 13.5  HCT 46.8 40.9 40.8 42.2 41.1  MCV 86.2 85.2 85.2 87.2 84.6  PLT 172 149* 163 154 127*   Cardiac Enzymes:  Recent Labs Lab 01/27/14 1320 01/27/14 1934  TROPONINI <0.30 <0.30   BNP (last 3 results)  Recent Labs  04/01/13 2115 04/23/13 1221 01/27/14 0929  PROBNP 1693.0* 1516.00* 6726.0*   CBG:  Recent Labs Lab 01/27/14 1058 01/27/14 1558  GLUCAP 114* 211*    Recent Results (from the past 240 hour(s))  URINE CULTURE     Status: None   Collection Time    01/27/14  9:05 AM      Result Value Ref Range Status   Specimen Description URINE, CATHETERIZED   Final  Special Requests NONE   Final   Culture  Setup Time     Final   Value: 01/27/2014 14:30     Performed at [employer] Count     Final   Value: NO GROWTH     Performed at [facility]   Culture     Final   Value: NO GROWTH     Performed at [facility]   Report Status 01/28/2014 FINAL   Final  CULTURE, BLOOD (ROUTINE X 2)     Status: None   Collection Time    01/27/14  9:20 AM      Result Value Ref Range Status   Specimen Description BLOOD RIGHT FOREARM   Final   Special Requests BOTTLES DRAWN AEROBIC ONLY 10CC   Final   Culture  Setup Time     Final   Value: 01/27/2014 14:12     Performed at [facility]   Culture     Final   Value:        BLOOD CULTURE RECEIVED NO GROWTH TO DATE CULTURE WILL BE HELD FOR 5 DAYS BEFORE ISSUING A FINAL NEGATIVE REPORT     Performed at [facility]   Report Status PENDING   Incomplete  CULTURE,  BLOOD (ROUTINE X 2)     Status: None   Collection Time    01/27/14  9:29 AM      Result Value Ref Range Status   Specimen Description BLOOD LEFT ANTECUBITAL   Final   Special Requests BOTTLES DRAWN AEROBIC ONLY 5CC   Final   Culture  Setup Time     Final   Value: 01/27/2014 14:12     Performed at [facility]   Culture     Final   Value:        BLOOD CULTURE RECEIVED NO GROWTH TO DATE CULTURE WILL BE HELD FOR 5 DAYS BEFORE ISSUING A FINAL NEGATIVE REPORT     Performed at [facility]   Report Status PENDING   Incomplete  MRSA PCR SCREENING     Status: None   Collection Time    01/27/14 11:05 AM      Result Value Ref Range Status   MRSA by PCR NEGATIVE  NEGATIVE Final   Comment:            The GeneXpert MRSA Assay (FDA     approved for NASAL specimens     only), is one component of a     comprehensive MRSA colonization     surveillance program. It is not     intended to diagnose MRSA     infection nor to guide or     monitor treatment for     MRSA infections.    Scheduled Meds:  Scheduled Meds: . amiodarone  400 mg Oral BID  . amoxicillin-clavulanate  1 tablet Oral Q12H  . apixaban  5 mg Oral BID  . budesonide (PULMICORT) nebulizer solution  0.25 mg Nebulization BID  . dextromethorphan-guaiFENesin  1 tablet Oral BID  . docusate sodium  100 mg Oral BID  . furosemide  40 mg Intravenous BID  . metoprolol tartrate  75 mg Oral TID  . nystatin  5 mL Oral QID  . pantoprazole  40 mg Oral Q1200  . polyethylene glycol  17 g Oral Daily  . predniSONE  5 mg Oral Q breakfast  . tamsulosin  0.4 mg Oral QPC supper  . tiotropium  18 mcg Inhalation Daily    Time spent on care of this patient: >30  mins   Vassie Loll , MD  [phone]  If 7PM-7AM, please contact night-coverage [email or web address] Password [email or web address] 02/01/2014, 3:29 PM   LOS: 5 days

## 2014-02-01 NOTE — Progress Notes (Signed)
Speech Language Pathology Treatment: Dysphagia  Patient Details Name: Richard Chavez MRN: [id number] DOB: 1926/12/19 Today's Date: 02/01/2014 Time: 4034-7425 SLP Time Calculation (min): 30 min  Assessment / Plan / Recommendation Clinical Impression  Pt seen during lunch. NA reports poor appetite. Son present assisting with lunch. Pt requires significant encouragement to eat anything. No overt s/s aspiration observed or reported with any consistency given, however, pt does indicate the sensation of food sticking.  Recommended to pt to begin meal with warm liquid, and to alternate solids and liquids. Pt has a significant history of esophageal issues (GERD, Barretts esophagus). Pt expectorated chewed pot roast, reporting he could not chew it sufficiently to swallow.  Diet changed to dys 2 for energy conservation and ease of mastication. ST to continue to monitor for tolerance of diet.   HPI HPI: 80 y/o WM with PMH of right  MCA CVA, GERD, CHF, Barretts esophagus, COPD, recurrent PNA,  who presented to the [facility] via EMS with complaints of worsening SOB and congestion. Work up concerning for RUL CAP, concern for recurrent aspiration. Required BiPAP in ER.    Pertinent Vitals Pain Assessment: Faces Faces Pain Scale: Hurts little more  SLP Plan  Continue with current plan of care    Recommendations Diet recommendations: Dysphagia 2 (fine chop);Thin liquid Liquids provided via: Cup Medication Administration: Whole meds with puree Supervision: Patient able to self feed;Full supervision/cueing for compensatory strategies Compensations: Slow rate;Small sips/bites Postural Changes and/or Swallow Maneuvers: Seated upright 90 degrees;Upright 30-60 min after meal              Oral Care Recommendations: Oral care BID Follow up Recommendations: None Plan: Continue with current plan of care    GO   Daryan Buell B. Murvin Natal [facility], CCC-SLP [phone]  Leigh Aurora 02/01/2014, 2:04 PM

## 2014-02-02 ENCOUNTER — Encounter (HOSPITAL_COMMUNITY): Payer: Self-pay | Admitting: General Practice

## 2014-02-02 DIAGNOSIS — J189 Pneumonia, unspecified organism: Secondary | ICD-10-CM

## 2014-02-02 DIAGNOSIS — N182 Chronic kidney disease, stage 2 (mild): Secondary | ICD-10-CM

## 2014-02-02 HISTORY — DX: Pneumonia, unspecified organism: J18.9

## 2014-02-02 LAB — CULTURE, BLOOD (ROUTINE X 2)
CULTURE: NO GROWTH
CULTURE: NO GROWTH

## 2014-02-02 LAB — BASIC METABOLIC PANEL
ANION GAP: 14 (ref 5–15)
BUN: 31 mg/dL — ABNORMAL HIGH (ref 6–23)
CHLORIDE: 97 meq/L (ref 96–112)
CO2: 25 meq/L (ref 19–32)
Calcium: 7.5 mg/dL — ABNORMAL LOW (ref 8.4–10.5)
Creatinine, Ser: 1.1 mg/dL (ref 0.50–1.35)
GFR calc Af Amer: 69 mL/min — ABNORMAL LOW (ref >90)
GFR calc non Af Amer: 59 mL/min — ABNORMAL LOW (ref >90)
Glucose, Bld: 77 mg/dL (ref 70–99)
Potassium: 6.1 mEq/L — ABNORMAL HIGH (ref 3.7–5.3)
SODIUM: 136 meq/L — AB (ref 137–147)

## 2014-02-02 LAB — MAGNESIUM: Magnesium: 2.4 mg/dL (ref 1.5–2.5)

## 2014-02-02 LAB — POTASSIUM: Potassium: 3.6 mEq/L — ABNORMAL LOW (ref 3.7–5.3)

## 2014-02-02 NOTE — Progress Notes (Signed)
SLP Cancellation Note  Patient Details Name: Richard Chavez MRN: [id number] DOB: 02/01/1927   Cancelled treatment:        Pt.  Using toilet when SLP attempted dysphagia treatment.  Will return tomorrow   Royce Macadamia 02/02/2014, 3:01 PM

## 2014-02-02 NOTE — Progress Notes (Signed)
Examined and chart reviewed. I don't see a definitive plan with regards to AF. Should he undergo cardioversion with TEE guidance prior to discharge. Will continue to diurese to control / stabilize CHF and once optimal, consider cardioversion. Was in NSR in June.

## 2014-02-02 NOTE — Progress Notes (Signed)
Progress Note  Richard Chavez  Admit HPI / Brief Narrative: [facility] by Critical Care for CAP and acute CHF and was noted to be in atrial fibrillation with RVR.  On arrival to the ED yesterday, CXR revealed cardiomegaly and Infiltrate/ consolidation in right upper lobe highly suspicious for pneumonia. WBC was WNL on admit but now elevated at 17.6. BNP was elevated at 6726. Troponin negative x 3. He was hypokalemic with K at 3.4. Scr 1.79. EKG demonstrated Afib with ventricular response in the 130s. He was started on BiPap therapy, antibiotics and admitted to the ICU. He was placed on IV Cardizem. His home dose of Metoprolol, 100 mg BID was continued, as was Eliquis. A repeat 2D echo was obtained and reveals a notable decrease in systolic function compared to prior study with new EF at 20-25% (40-45% 09/2012). Diffuse hypokinesis was noted. Now actively receiving diuresis   HPI/Subjective: Patient is Afebrile; feeling better and breathing easier today (9/29). No CP.   Assessment/Plan: Acute Hypoxic Respiratory failure  -improving/resolving -continue IV lasix at 40mg  BID; good urine output -daily weight, low sodium diet -continue abx's treatment for PNA  RUL PNA/ CAP (recurrent possible aspiration) -Continue antibiotics for a full eight-day course; using augmentin PO now -day 7/8 -no fever, WBC's WNL -CXR demonstrating consolidation; but might be abnormal up to 4 weeks, w/o representing active disease.  COPD (not in exacerbation) -Chronic Prednisone Dependence( 5mg  QD) -Continue albuterol nebulizer PRN -Continue Spiriva daily -continue pulmicort -Flutter valve q2hr while awake -continue Mucinex BID  OSA -does not wear CPAP  -Continue O2 as needed to maintain SpO2 > 92%   A-Fib with RVR (chronic) - Per cardiology continue PO amiodarone a 400mg  -Continue metoprolol 75mg ID -rate is somewhat  better -will benefit of cardioversion once CHF/volume stabilize  Acute on Chronic CHF - LVEF now 20-25% on 9/23 echo  -cardiology on board and helping with management -Strict I&O -Daily weight -continue lasix IV 40mg  BID now  HTN  -stable and currently controlled. -continue current regimen and low sodium diet  Hx CAD  -Cardiology following   -Continued Adjusted Eliquis dose for renal failure  -continue b-blocker -no CP currently  Chronic renal failure (baseline Cr 1.2-1.6)  -Patient Cr 1.10 (per GFR essentially at baseline) -close monitoring while diuresing  Hypokalemia  -Monitor BMET  -Replace electrolytes as needed  -Potassium goal > 4 (as much as possible) -will also follow Mg -hyperkalemia this morning due to hemolysis  GERD /Hx Barrett's esophagus/Concern for Recurrent Aspiration  -S/P SLP evaluation; regular with thin liquid  -Continue Protonix 40 mg daily   Anemia  -Continue Eliquis dose adjusted for renal function -Monitor CBC  -Tx for Hgb < 8  Hx CVA - residual weakness to UE  -cognitive status back to baseline -continue eliquis for stroke prevention  BPH/Urinary retention and hematuria -urology on  board -Foley cath place; plan is to keep it in until 9/30 and at that time performed voiding trial -continue flomax -Hgb stable; urine is now clear -ok to continue eliquis -will follow rec's  Mild dementia?  -Holding home Neurontin  -Minimize sedating medications   Code Status: FULL Family Communication: Son  present at time of exam Disposition Plan: to be determine; might need SNF prior to go home    Consultants: -Dr. Leodis SiasPhillip Nahser (cardiology) -Dr. Rory Percyaniel Feinstein [facility] -Dr. Berneice Heinrichmanny (urology)   Procedure/Significant Events: 9/23 Echo> LVEF 20-25%, diffuse hypokinesis  Culture  9/23 blood right forearm/left antecubital NGTD 9/23 urine negative 9/23 Sputum; does not appear to have been drawn   9/23 MRSA by PCR  negative   Antibiotics: Azithromycin 9/23>>9/26 Ceftriaxone 9/23>>9/26 augmentin 9/26 (last dose 9/30)   DVT prophylaxis: Eliquis  Devices Pacer present right chest wall     Continuous Infusions: . sodium chloride 10 mL/hr at 01/28/14 0600    Objective: VITAL SIGNS: Temp: 97.3 F (36.3 C) (09/29 0550) Temp src: Oral (09/29 0550) BP: 142/75 mmHg (09/29 0819) Pulse Rate: 77 (09/29 0819)   Intake/Output Summary (Last 24 hours) at 02/02/14 1554 Last data filed at 02/02/14 1406  Gross per 24 hour  Intake    480 ml  Output   1686 ml  Net  -1206 ml     Exam: General: afebrile; denies CP; reports breathing is improving and today he feels better; sitting up in chair. Patient with poor appetite   Lungs: decreased BS at bases, fine crackles and mild exp wheezing (last one, according to son and patient, not new; always with some intermittent wheezing) Cardiovascular: Irregular, no murmurs, gallops or rub; normal S1 and S2 Abdomen: Nontender, nondistended, soft, bowel sounds positive, no rebound, no ascites, no appreciable mass Extremities: No significant cyanosis or clubbing; 1-2+ LE edema bilaterally (Pedal to mid shin; improving)  Neurologic; cranial nerves 2-12 intact, pupils equal round reactive to light and accommodation, patient moves all extremities to command, sensation intact.  Data Reviewed: Basic Metabolic Panel:  Recent Labs Lab 01/28/14 0258 01/29/14 0310 01/30/14 0432 01/31/14 0450 02/02/14 0440 02/02/14 1216  NA 139 139 139 135* 136*  --   K 3.5* 3.8 3.5* 3.8 6.1* 3.6*  CL 99 98 99 98 97  --   CO2 25 25 23 22 25   --   GLUCOSE 156* 131* 101* 93 77  --   BUN 28* 28* 31* 28* 31*  --   CREATININE 1.56* 1.44* 1.19 1.01 1.10  --   CALCIUM 8.3* 8.3* 8.3* 7.8* 7.5*  --   MG  --   --  2.2  --  2.4  --    Liver Function Tests:  Recent Labs Lab 01/27/14 1200 01/30/14 0432  AST 22 18  ALT 35 22  ALKPHOS 55 71  BILITOT 2.1* 2.0*  PROT 6.3 6.3   ALBUMIN 3.5 2.8*   CBC:  Recent Labs Lab 01/27/14 0929 01/28/14 0258 01/29/14 0310 01/30/14 0432 01/31/14 0450  WBC 10.2 17.6* 13.9* 10.6* 7.8  NEUTROABS  --   --  11.5* 8.6*  --   HGB 15.4 13.6 13.8 13.9 13.5  HCT 46.8 40.9 40.8 42.2 41.1  MCV 86.2 85.2 85.2 87.2 84.6  PLT 172 149* 163 154 127*   Cardiac Enzymes:  Recent Labs Lab 01/27/14 1320 01/27/14 1934  TROPONINI <0.30 <0.30   BNP (last 3 results)  Recent Labs  04/01/13 2115 04/23/13 1221 01/27/14 0929  PROBNP 1693.0* 1516.00* 6726.0*   CBG:  Recent Labs Lab 01/27/14 1058 01/27/14 1558  GLUCAP 114* 211*    Recent Results (from the past 240 hour(s))  URINE CULTURE     Status: None   Collection Time    01/27/14  9:05 AM      Result Value Ref Range Status   Specimen Description URINE, CATHETERIZED   Final   Special Requests NONE   Final   Culture  Setup Time     Final   Value: 01/27/2014 14:30     Performed at [facility]  [place] Count     Final   Value: NO GROWTH     Performed at [facility]   Culture     Final   Value: NO GROWTH     Performed at [facility]   Report Status 01/28/2014 FINAL   Final  CULTURE, BLOOD (ROUTINE X 2)     Status: None   Collection Time    01/27/14  9:20 AM      Result Value Ref Range Status   Specimen Description BLOOD RIGHT FOREARM   Final   Special Requests BOTTLES DRAWN AEROBIC ONLY 10CC   Final   Culture  Setup Time     Final   Value: 01/27/2014 14:12     Performed at [facility]   Culture     Final   Value: NO GROWTH 5 DAYS     Performed at [facility]   Report Status 02/02/2014 FINAL   Final  CULTURE, BLOOD (ROUTINE X 2)     Status: None   Collection Time    01/27/14  9:29 AM      Result Value Ref Range Status   Specimen Description BLOOD LEFT ANTECUBITAL   Final   Special Requests BOTTLES DRAWN AEROBIC ONLY 5CC   Final   Culture  Setup Time     Final   Value: 01/27/2014 14:12     Performed at [facility]   Culture     Final   Value: NO GROWTH 5 DAYS     Performed at [facility]   Report Status 02/02/2014 FINAL   Final  MRSA PCR SCREENING     Status: None   Collection Time    01/27/14 11:05 AM      Result Value Ref Range Status   MRSA by PCR NEGATIVE  NEGATIVE Final   Comment:            The GeneXpert MRSA Assay (FDA     approved for NASAL specimens     only), is one component of a     comprehensive MRSA colonization     surveillance program. It is not     intended to diagnose MRSA     infection nor to guide or     monitor treatment for     MRSA infections.    Scheduled Meds:  Scheduled Meds: . amiodarone  400 mg Oral BID  . amoxicillin-clavulanate  1 tablet Oral Q12H  . apixaban  5 mg Oral BID  . budesonide (PULMICORT) nebulizer solution  0.25 mg Nebulization BID  . dextromethorphan-guaiFENesin  1 tablet Oral BID  . docusate sodium  100 mg Oral BID  . furosemide  40 mg Intravenous BID  . metoprolol tartrate  75 mg Oral TID  . nystatin  5 mL Oral QID  . pantoprazole  40 mg Oral Q1200  . polyethylene glycol  17 g Oral Daily  . predniSONE  5 mg Oral Q breakfast  . tamsulosin  0.4 mg Oral QPC supper  . tiotropium  18 mcg Inhalation Daily    Time spent on care of this patient: >30  mins   Vassie Loll , Chavez  [phone]  If 7PM-7AM, please contact night-coverage [email or web address] Password [email or web address] 02/02/2014, 3:54 PM   LOS: 6 days

## 2014-02-02 NOTE — Progress Notes (Signed)
Patient evaluated for community based chronic disease management services with [facility] Care Management Program as a benefit of patient's [employer]. Spoke with patient and son at bedside to explain [facility] Care Management services.  Patient has declined services at this time.  He and his son feel he is adequately support for home management.  Left contact information and [facility] literature at bedside. Made Inpatient Case Manager aware that [facility] Care Management following. Of note, [facility] Care Management services does not replace or interfere with any services that are arranged by inpatient case management or social work.  For additional questions or referrals please contact Anibal Henderson BSN RN [facility] Liaison at [phone].

## 2014-02-02 NOTE — Evaluation (Signed)
Physical Therapy Evaluation Patient Details Name: Richard ShuttersFred M Chavez MRN: [id number] DOB: [birth year] Today's Date: 02/02/2014   History of Present Illness  Pt is an 80 y/o M with PMH of CVA, GERD, CHF [facility] with complaints of worsening SOB and congestion. Work up concerning for RML CAP.  Clinical Impression  Pt admitted with the above. Pt currently with functional limitations due to the deficits listed below (see PT Problem List). At the time of PT eval pt was able to perform transfers and ambulation with assistance for balance and general safety. Pt and son agreeable to SNF at d/c. Pt will benefit from skilled PT to increase their independence and safety with mobility to allow discharge to the venue listed below.      Follow Up Recommendations SNF;Supervision/Assistance - 24 hour    Equipment Recommendations  None recommended by PT    Recommendations for Other Services       Precautions / Restrictions Precautions Precautions: Fall Restrictions Weight Bearing Restrictions: No      Mobility  Bed Mobility Overal bed mobility: Needs Assistance Bed Mobility: Supine to Sit     Supine to sit: Mod assist     General bed mobility comments: VC's for sequencing and technique. Pt was encouraged to do as much independently as he could, however was continuously reaching for therapist to pull on.   Transfers Overall transfer level: Needs assistance Equipment used: Rolling walker (2 wheeled) Transfers: Sit to/from Stand Sit to Stand: Min assist         General transfer comment: VC's for hand placement on seated surface for safety. Pt grabbing at front of walker to pull himself up off the bed, and would not make corrective changes for hand placement with VC's.   Ambulation/Gait Ambulation/Gait assistance: Min assist Ambulation Distance (Feet): 50 Feet Assistive device: Rolling walker (2 wheeled) Gait Pattern/deviations: Step-through pattern;Decreased stride  length;Trunk flexed Gait velocity: Decreased Gait velocity interpretation: Below normal speed for age/gender General Gait Details: VC's for sequencing and safety awareness with the RW. Pt fatigued quickly. Assist for occasional unsteadiness and walker direction.  Stairs            Wheelchair Mobility    Modified Rankin (Stroke Patients Only)       Balance Overall balance assessment: Needs assistance Sitting-balance support: Feet supported;No upper extremity supported Sitting balance-Leahy Scale: Fair     Standing balance support: Bilateral upper extremity supported;During functional activity Standing balance-Leahy Scale: Poor Standing balance comment: Pt requires UE support to maintain standing balance.                              Pertinent Vitals/Pain Pain Assessment: No/denies pain    Home Living Family/patient expects to be discharged to:: Private residence Living Arrangements: Children Available Help at Discharge: Family;Available 24 hours/day Type of Home: House Home Access: Stairs to enter Entrance Stairs-Rails: Can reach both Entrance Stairs-Number of Steps: 2 Home Layout: One level Home Equipment: Grab bars - tub/shower;Walker - 2 wheels;Cane - single point;Shower seat      Prior Function Level of Independence: Independent with assistive device(s)         Comments: Uses walker most of the time     Hand Dominance   Dominant Hand: Left    Extremity/Trunk Assessment   Upper Extremity Assessment: Defer to OT evaluation           Lower Extremity Assessment: Generalized weakness  Cervical / Trunk Assessment: Normal  Communication   Communication: Expressive difficulties  Cognition Arousal/Alertness: Awake/alert Behavior During Therapy: WFL for tasks assessed/performed Overall Cognitive Status: Within Functional Limits for tasks assessed                      General Comments      Exercises         Assessment/Plan    PT Assessment Patient needs continued PT services  PT Diagnosis Difficulty walking;Generalized weakness   PT Problem List Decreased strength;Decreased range of motion;Decreased activity tolerance;Decreased balance;Decreased mobility;Decreased knowledge of use of DME;Decreased safety awareness;Decreased knowledge of precautions;Cardiopulmonary status limiting activity  PT Treatment Interventions DME instruction;Gait training;Stair training;Functional mobility training;Therapeutic activities;Therapeutic exercise;Neuromuscular re-education;Patient/family education   PT Goals (Current goals can be found in the Care Plan section) Acute Rehab PT Goals Patient Stated Goal: Pt did not state goals. PT Goal Formulation: With patient/family Time For Goal Achievement: 02/16/14 Potential to Achieve Goals: Fair    Frequency Min 2X/week   Barriers to discharge        Co-evaluation               End of Session Equipment Utilized During Treatment: Gait belt Activity Tolerance: Patient limited by fatigue Patient left: in chair;with call bell/phone within reach;with chair alarm set;with family/visitor present Nurse Communication: Mobility status         Time: 1791-5056 PT Time Calculation (min): 25 min   Charges:   PT Evaluation $Initial PT Evaluation Tier I: 1 Procedure PT Treatments $Gait Training: 8-22 mins $Therapeutic Activity: 8-22 mins   PT G Codes:          Ruthann Cancer 02/02/2014, 1:57 PM  Ruthann Cancer, PT, DPT Acute Rehabilitation Services Pager: [phone]

## 2014-02-02 NOTE — Progress Notes (Signed)
Patient Name: Richard Chavez Date of Encounter: 02/02/2014     Active Problems:   Pneumonia   Acute respiratory failure   Atrial fibrillation with RVR   Acute on chronic systolic HF (heart failure)    SUBJECTIVE  Feeling much better today .  CURRENT MEDS . amiodarone  400 mg Oral BID  . amoxicillin-clavulanate  1 tablet Oral Q12H  . apixaban  5 mg Oral BID  . budesonide (PULMICORT) nebulizer solution  0.25 mg Nebulization BID  . dextromethorphan-guaiFENesin  1 tablet Oral BID  . docusate sodium  100 mg Oral BID  . furosemide  40 mg Intravenous BID  . metoprolol tartrate  75 mg Oral TID  . nystatin  5 mL Oral QID  . pantoprazole  40 mg Oral Q1200  . polyethylene glycol  17 g Oral Daily  . predniSONE  5 mg Oral Q breakfast  . tamsulosin  0.4 mg Oral QPC supper  . tiotropium  18 mcg Inhalation Daily    OBJECTIVE  Filed Vitals:   02/01/14 2137 02/02/14 0550 02/02/14 0819 02/02/14 0849  BP: 92/69 134/81 142/75   Pulse: 93 93 77   Temp: 97.4 F (36.3 C) 97.3 F (36.3 C)    TempSrc: Oral Oral    Resp: 18 19 16    Height:      Weight:  228 lb 13.4 oz (103.8 kg)    SpO2: 96% 96% 99% 100%    Intake/Output Summary (Last 24 hours) at 02/02/14 1000 Last data filed at 02/02/14 0601  Gross per 24 hour  Intake    360 ml  Output   1736 ml  Net  -1376 ml   Filed Weights   01/30/14 0506 02/01/14 0554 02/02/14 0550  Weight: 235 lb 0.2 oz (106.6 kg) 229 lb 8 oz (104.1 kg) 228 lb 13.4 oz (103.8 kg)    PHYSICAL EXAM  General: A./O. x2, NAD, improvement in breathing Lungs: decreased BS at bases, fine crackles and positive wheezing  Cardiovascular: Irregular irregular rhythm and rate without murmur gallop or rub normal S1 and S2  Abdomen: Nontender, nondistended, soft, bowel sounds positive, no rebound, no ascites, no appreciable mass  Extremities: No significant cyanosis, clubbing/ 1 +  edema bilateral lower extremities  Neurologic; cranial nerves 2-12 intact, pupils equal  round reactive to light and accommodation, patient moves all extremities to command, sensation intact   Accessory Clinical Findings  CBC  Recent Labs  01/31/14 0450  WBC 7.8  HGB 13.5  HCT 41.1  MCV 84.6  PLT 127*   Basic Metabolic Panel  Recent Labs  01/31/14 0450 02/02/14 0440  NA 135* 136*  K 3.8 6.1*  CL 98 97  CO2 22 25  GLUCOSE 93 77  BUN 28* 31*  CREATININE 1.01 1.10  CALCIUM 7.8* 7.5*  MG  --  2.4    TELE  afib with rate control. Freq PVCs.  Radiology/Studies  Dg Chest 2 View  01/31/2014   CLINICAL DATA:  Shortness of breath and vascular congestion, followup, history hypertension, coronary artery disease post MI, chronic renal failure, COPD, GERD, ischemic cardiomyopathy  EXAM: CHEST  2 VIEW  COMPARISON:  01/28/2014  FINDINGS: RIGHT subclavian sequential pacemaker leads project at RIGHT atrium and RIGHT ventricle.  Enlargement of cardiac silhouette post CABG.  Atherosclerotic calcification aortic arch.  Stable mediastinal contours.  Persistent consolidation RIGHT upper lobe.  Pulmonary vascular congestion accentuation of pulmonary markings throughout remainder of lungs appear stable.  No additional infiltrate, pleural effusion  or pneumothorax.  IMPRESSION: Persistent RIGHT upper lobe consolidation consistent with pneumonia.  Enlargement of cardiac silhouette post pacemaker and CABG.   Electronically Signed   By: Ulyses SouthwardMark  Boles M.D.   On: 01/31/2014 08:27   Dg Chest Port 1 View  01/28/2014   CLINICAL DATA:  Evaluate pneumonia  EXAM: PORTABLE CHEST - 1 VIEW  COMPARISON:  01/27/2014  FINDINGS: Persistent right upper lobe pneumonia is identified although mild increase in aeration in the right upper lobe is noted. The lungs are otherwise within normal limits. Postsurgical changes are seen. Cardiac shadow is stable. No acute bony abnormality is noted. A pacing device is again seen.  IMPRESSION: Persistent but mildly improved right upper lobe pneumonia.   Electronically  Signed   By: Alcide CleverMark  Lukens M.D.   On: 01/28/2014 07:05   Dg Chest Portable 1 View  01/27/2014   CLINICAL DATA:  History of CVA  EXAM: PORTABLE CHEST - 1 VIEW  COMPARISON:  04/01/2013  FINDINGS: Cardiomegaly is noted. Dual lead cardiac pacemaker is unchanged in position. Status post median sternotomy. There is infiltrate/ consolidation in right upper lobe highly suspicious for pneumonia. Follow-up to resolution is recommended. No pulmonary edema.  IMPRESSION: Cardiomegaly. Dual lead cardiac pacemaker in place. Infiltrate/ consolidation in right upper lobe highly suspicious for pneumonia. Follow-up to resolution is recommended.   Electronically Signed   By: Natasha MeadLiviu  Pop M.D.   On: 01/27/2014 08:59    ASSESSMENT AND PLAN  [name] is a 80 y.o. male with a history of A-Fib, CAD s/p CABG, chronic systolic CHF, HTN, CKD, and CHB s/p PPM who was admitted on 01/27/14 with dyspnea and was diagnosed with pneumonia. Pacer was interrogated and he was found to have AFib for the past 79 days - did not appear to be related to the acute illness.   Persistent atrial fibrillation, onset over 2 months ago by pacer interrogation. Working on heart rate control at this time. On high-dose Lopressor, IV amiodarone started by Dr. Elease HashimotoNahser - converted to oral 01/30/14. -- Continue Eliquis 5mg  BID, IV Lasix 40mg  BID, Lopressor 75mg  TID, amiodarone 400mg  BID. -- HR relatively well controlled ~ 110  CAD status post previous CABG. No active angina.  -- Troponin neg  Cardiomyopathy/CHF, LVEF 20-25% by most recent echocardiogram, diffuse hypokinesis. Represents decrease compared to prior testing, question tachycardia mediated. Had patent grafts in 2013. Consider outpatient ischemic testing once more stable. Lasix started back yesterday and patient diuresing.  -- Lasix increased to IV 40mg  BID yesterday. Net neg 4.4L. Weight down 15lbs. Creat stable.  History of complete heart block, status post St. Jude pacemaker.   Pneumonia  with respiratory failure, per primary team. Remains limiting issue. -- White count resolving -- CXR yesterday with persistent RIGHT upper lobe consolidation consistent with pneumonia.    Hyperkalemia- K 6.1. Lab reports moderate hemolysis. Will order a stat repeat K.   Chronic renal insufficiency- GFR ~70 at baseline. 76 on 01/31/14    Billy FischerSigned, Tysheena Ginzburg R PA-C  Pager [phone]

## 2014-02-03 DIAGNOSIS — R339 Retention of urine, unspecified: Secondary | ICD-10-CM | POA: Diagnosis present

## 2014-02-03 DIAGNOSIS — I429 Cardiomyopathy, unspecified: Secondary | ICD-10-CM

## 2014-02-03 DIAGNOSIS — I428 Other cardiomyopathies: Secondary | ICD-10-CM

## 2014-02-03 LAB — BASIC METABOLIC PANEL
ANION GAP: 17 — AB (ref 5–15)
BUN: 35 mg/dL — ABNORMAL HIGH (ref 6–23)
CO2: 21 mEq/L (ref 19–32)
Calcium: 7.8 mg/dL — ABNORMAL LOW (ref 8.4–10.5)
Chloride: 96 mEq/L (ref 96–112)
Creatinine, Ser: 1.15 mg/dL (ref 0.50–1.35)
GFR calc Af Amer: 65 mL/min — ABNORMAL LOW (ref >90)
GFR calc non Af Amer: 56 mL/min — ABNORMAL LOW (ref >90)
Glucose, Bld: 78 mg/dL (ref 70–99)
POTASSIUM: 5 meq/L (ref 3.7–5.3)
SODIUM: 134 meq/L — AB (ref 137–147)

## 2014-02-03 LAB — CBC
HCT: 43.8 % (ref 39.0–52.0)
Hemoglobin: 14.6 g/dL (ref 13.0–17.0)
MCH: 28.9 pg (ref 26.0–34.0)
MCHC: 33.3 g/dL (ref 30.0–36.0)
MCV: 86.7 fL (ref 78.0–100.0)
PLATELETS: 198 10*3/uL (ref 150–400)
RBC: 5.05 MIL/uL (ref 4.22–5.81)
RDW: 16.1 % — ABNORMAL HIGH (ref 11.5–15.5)
WBC: 6.7 10*3/uL (ref 4.0–10.5)

## 2014-02-03 LAB — PRO B NATRIURETIC PEPTIDE: PRO B NATRI PEPTIDE: 3422 pg/mL — AB (ref 0–450)

## 2014-02-03 NOTE — Progress Notes (Signed)
The patient still feel weak and fatigued. He denies angina. He has no appetite. Amiodarone was started on 01/29/14, initially intravenously, and subsequently 400 mg daily until the present time. He is 5.5 L negative sedation. Diminished breath sounds are noted bilaterally. Cardiac exam reveals irregularly irregular rhythm that is overriding his pacemaker to. There is mild azotemia.  Plan continue oral amiodarone and set up for cardioversion on Friday 02/05/14. The reason for waiting is additional amiodarone loading and hopefully further optimization of heart failure. We need to be careful to watch renal function to avoid acute kidney injury.

## 2014-02-03 NOTE — Progress Notes (Signed)
Speech Language Pathology Treatment: Dysphagia  Patient Details Name: Richard Chavez MRN: [id number] DOB: 10/12/1926 Today's Date: 02/03/2014 Time: 1601-0932 SLP Time Calculation (min): 18 min  Assessment / Plan / Recommendation Clinical Impression  Son present during swallow intervention who reports pt. Is more lethargic today, increased SOB, refused breakfast lunch except for several sips coffee, OJ and tea.  Consumed consecutive cup sips water without s/s aspiration with min verbal reminders for small sips.  Pt. States there is nothing he wants to eat/drink, "no appetite" and denied difficulty masticating the Dys 2 textures.  SLP strongly encouraged him to eat some supper even if not hungry to increase strength.  SLP check documented vital signs and no indication of fever or decreased breath sounds.   HPI HPI: 80 y/o WM with PMH of right  MCA CVA, GERD, CHF, Barretts esophagus, COPD, recurrent PNA,  who presented to the [facility] via EMS with complaints of worsening SOB and congestion. Work up concerning for RUL CAP, concern for recurrent aspiration. Required BiPAP in ER.    Pertinent Vitals Pain Assessment: No/denies pain  SLP Plan  Continue with current plan of care    Recommendations Diet recommendations: Dysphagia 2 (fine chop);Thin liquid Liquids provided via: Cup Medication Administration: Whole meds with puree Supervision: Patient able to self feed;Full supervision/cueing for compensatory strategies Compensations: Slow rate;Small sips/bites;Follow solids with liquid Postural Changes and/or Swallow Maneuvers: Seated upright 90 degrees;Upright 30-60 min after meal              Oral Care Recommendations: Oral care BID Follow up Recommendations:  (TBD) Plan: Continue with current plan of care    GO     Royce Macadamia 02/03/2014, 1:57 PM  Breck Coons Lonell Face.Ed [employer] [phone]

## 2014-02-03 NOTE — Progress Notes (Signed)
Physical Therapy Treatment Patient Details Name: Richard Chavez MRN: [id number] DOB: February 03, 1927 Today's Date: 02/03/2014    History of Present Illness Pt is an 80 y/o M with PMH of CVA, GERD, CHF who presented to the [facility] via EMS with complaints of worsening SOB and congestion. Work up concerning for RML CAP.    PT Comments    Pt needed max encouragement to participate in PT today reporting increased SOB and fatigue.  Pt agreeable to stand pivot transfer to chair only.  Follow Up Recommendations  SNF;Supervision/Assistance - 24 hour     Equipment Recommendations  None recommended by PT    Recommendations for Other Services       Precautions / Restrictions Precautions Precautions: Fall Restrictions Weight Bearing Restrictions: No    Mobility  Bed Mobility Overal bed mobility: Needs Assistance Bed Mobility: Supine to Sit     Supine to sit: Mod assist     General bed mobility comments: VC's for sequencing and technique. Pt was encouraged to do as much independently as he could, however was continuously reaching for therapist to pull on.  Limited use of LUE due to hx of CVA  Transfers Overall transfer level: Needs assistance Equipment used: Rolling walker (2 wheeled) Transfers: Sit to/from [employer] Sit to Stand: Mod assist Stand pivot transfers: Min assist       General transfer comment: VCs for technique; pt's son present and assisting as well due to pt c/o of increased fatigue  Ambulation/Gait             General Gait Details: pt declined ambulation today due to weakness and fatigue   Stairs            Wheelchair Mobility    Modified Rankin (Stroke Patients Only)       Balance Overall balance assessment: Needs assistance Sitting-balance support: No upper extremity supported;Feet supported Sitting balance-Leahy Scale: Fair     Standing balance support: Bilateral upper extremity supported;During functional activity Standing  balance-Leahy Scale: Poor Standing balance comment: strong posterior lean initially with sit to stand needing mod A to facilitate ant weight shift                    Cognition Arousal/Alertness: Lethargic Behavior During Therapy: Flat affect Overall Cognitive Status: Within Functional Limits for tasks assessed                      Exercises      General Comments General comments (skin integrity, edema, etc.): pt and son report increased SOB and fatigue today limiting ability to further progress      Pertinent Vitals/Pain Pain Assessment: No/denies pain    Home Living                      Prior Function            PT Goals (current goals can now be found in the care plan section) Acute Rehab PT Goals Patient Stated Goal: to feel better PT Goal Formulation: With patient/family Time For Goal Achievement: 02/16/14 Potential to Achieve Goals: Fair Progress towards PT goals: Not progressing toward goals - comment (due to increased fatigue and SOB)    Frequency  Min 2X/week    PT Plan Current plan remains appropriate    Co-evaluation             End of Session Equipment Utilized During Treatment: Gait belt Activity Tolerance: Patient limited by  fatigue Patient left: in chair;with call bell/phone within reach;with chair alarm set;with family/visitor present     Time: [id number] PT Time Calculation (min): 19 min  Charges:  $Therapeutic Activity: 8-22 mins                    G Codes:      Ernestene MentionMatthews, Craig Wisnewski K 02/03/2014, 3:49 PM  Clarita CraneStephanie F Jammi Morrissette, PT, DPT [phone]

## 2014-02-03 NOTE — Progress Notes (Signed)
Patient Profile: Richard Chavez is a 80 y.o. male with a history of A-Fib, CAD s/p CABG, chronic systolic CHF, HTN, CKD, and CHB s/p PPM who was admitted on 01/27/14 with dyspnea and was diagnosed with pneumonia. Pacer was interrogated and he was found to have AFib for the past 79 days - did not appear to be related to the acute illness.    Subjective: No major complaints. Breathing has improved. Mild palpitations but not severe.   Objective: Vital signs in last 24 hours: Temp:  [97.3 F (36.3 C)-97.9 F (36.6 C)] 97.9 F (36.6 C) (09/30 0636) Pulse Rate:  [90-108] 108 (09/30 1135) Resp:  [18] 18 (09/30 1135) BP: (130-153)/(70-90) 153/80 mmHg (09/30 1135) SpO2:  [92 %-100 %] 99 % (09/30 1135) Weight:  [228 lb 9.9 oz (103.7 kg)] 228 lb 9.9 oz (103.7 kg) (09/30 0636) Last BM Date: 01/29/14  Intake/Output from previous day: 09/29 0701 - 09/30 0700 In: 480 [P.O.:480] Out: 1477 [Urine:1475; Stool:2] Intake/Output this shift: Total I/O In: 340 [P.O.:340] Out: -   Medications Current Facility-Administered Medications  Medication Dose Route Frequency Provider Last Rate Last Dose  . 0.9 %  sodium chloride infusion   Intravenous Continuous Jeanella Craze, NP 10 mL/hr at 01/28/14 0600    . albuterol (PROVENTIL) (2.5 MG/3ML) 0.083% nebulizer solution 2.5 mg  2.5 mg Nebulization Q3H PRN Jeanella Craze, NP   2.5 mg at 01/30/14 2208  . amiodarone (PACERONE) tablet 400 mg  400 mg Oral BID Jonelle Sidle, MD   400 mg at 02/03/14 1122  . amoxicillin-clavulanate (AUGMENTIN) 875-125 MG per tablet 1 tablet  1 tablet Oral Q12H Vassie Loll, MD   1 tablet at 02/03/14 1121  . apixaban (ELIQUIS) tablet 5 mg  5 mg Oral BID Vassie Loll, MD   5 mg at 02/03/14 1123  . budesonide (PULMICORT) nebulizer solution 0.25 mg  0.25 mg Nebulization BID Vassie Loll, MD   0.25 mg at 02/03/14 0950  . dextromethorphan-guaiFENesin (MUCINEX DM) 30-600 MG per 12 hr tablet 1 tablet  1 tablet Oral BID Drema Dallas,  MD   1 tablet at 02/03/14 1121  . docusate sodium (COLACE) capsule 100 mg  100 mg Oral BID Lupita Leash, MD   100 mg at 02/02/14 2203  . furosemide (LASIX) injection 40 mg  40 mg Intravenous BID Donato Schultz, MD   40 mg at 02/03/14 0900  . Influenza vac split quadrivalent PF (FLUARIX) injection 0.5 mL  0.5 mL Intramuscular Prior to discharge Alyson Reedy, MD      . metoprolol tartrate (LOPRESSOR) tablet 75 mg  75 mg Oral TID Jonelle Sidle, MD   75 mg at 02/03/14 1122  . nystatin (MYCOSTATIN) 100000 UNIT/ML suspension 500,000 Units  5 mL Oral QID Vassie Loll, MD   500,000 Units at 02/03/14 1000  . ondansetron (ZOFRAN) injection 4 mg  4 mg Intravenous Q6H PRN Drema Dallas, MD   4 mg at 01/31/14 0807  . pantoprazole (PROTONIX) EC tablet 40 mg  40 mg Oral Q1200 Jeanella Craze, NP   40 mg at 02/02/14 1600  . polyethylene glycol (MIRALAX / GLYCOLAX) packet 17 g  17 g Oral Daily Vassie Loll, MD   17 g at 02/02/14 1021  . predniSONE (DELTASONE) tablet 5 mg  5 mg Oral Q breakfast Jeanella Craze, NP   5 mg at 02/03/14 0800  . tamsulosin (FLOMAX) capsule 0.4 mg  0.4 mg Oral QPC supper Mikle Bosworth  Gwenlyn PerkingMadera, MD   0.4 mg at 02/02/14 1910  . tiotropium (SPIRIVA) inhalation capsule 18 mcg  18 mcg Inhalation Daily Lupita Leashouglas B McQuaid, MD   18 mcg at 02/03/14 0950    PE: General appearance: alert, cooperative, no distress and moderately obese Neck: no JVD Lungs: difuse crackles over the entire right lung field. Faint bailar crakles on the left. Heart: irregularly irregular rhythm Extremities: 1+ bilateral LEE Pulses: 2+ and symmetric Skin: warm and dry Neurologic: Grossly normal  Lab Results:   Recent Labs  02/03/14 0915  WBC 6.7  HGB 14.6  HCT 43.8  PLT 198   BMET  Recent Labs  02/02/14 0440 02/02/14 1216 02/03/14 0915  NA 136*  --  134*  K 6.1* 3.6* 5.0  CL 97  --  96  CO2 25  --  21  GLUCOSE 77  --  78  BUN 31*  --  35*  CREATININE 1.10  --  1.15  CALCIUM 7.5*  --  7.8*      Assessment/Plan  Principal Problem:   Acute respiratory failure Active Problems:   Hypercholesteremia   COPD (chronic obstructive pulmonary disease)   GERD (gastroesophageal reflux disease)   Pacemaker, St Jude, implanted 2008   CKD (chronic kidney disease) stage 2, GFR 60-89 ml/min   CAP (community acquired pneumonia)   Atrial fibrillation with RVR   Acute on chronic systolic HF (heart failure)   Urinary retention   Cardiomyopathy  1. Persistent atrial fibrillation, onset over 2 months ago by pacer interrogation. Resting rate is controlled in the 80s-90s. Continue amiodarone and metoprolol for rhythm/rate control and Eliquis for stroke prophylaxis. ? DCCV.   2. CAD status post previous CABG. No active angina.  -- Troponin neg   3. Cardiomyopathy/CHF, LVEF 20-25% by most recent echocardiogram, diffuse hypokinesis. Represents decrease compared to prior testing, question tachycardia mediated. Had patent grafts in 2013. Consider outpatient ischemic testing once more stable. He still has peripheral edema. Continue IV Lasix. Continue strict I/Os, daily weights and low sodium diet. Will monitor renal function and electrolytes.   4. History of complete heart block, status post St. Jude pacemaker.   5. Pneumonia with respiratory failure, per primary team. Remains limiting issue.  -- White count now WNL -- F/U CXR with persistent RIGHT upper lobe consolidation consistent with pneumonia.     LOS: 7 days    Brittainy M. Sharol HarnessSimmons, PA-C 02/03/2014 11:46 AM

## 2014-02-03 NOTE — Progress Notes (Signed)
[facility] PROGRESS NOTE  Richard ShuttersFred M Chavez ZOX:[id number] DOB: 07-Sep-1926 DOA: 01/27/2014 PCP: Thayer HeadingsMACKENZIE,BRIAN, MD   Admit HPI / Brief Narrative:  Ad[facility] 01/27/14 by Critical Care for CAP and acute CHF and was noted to be in atrial fibrillation with RVR.  On arrival to the ED yesterday, CXR revealed cardiomegaly and Infiltrate/ consolidation in right upper lobe highly suspicious for pneumonia. WBC was WNL on admit but now elevated at 17.6. BNP was elevated at 6726. Troponin negative x 3. He was hypokalemic with K at 3.4. Scr 1.79. EKG demonstrated Afib with ventricular response in the 130s. He was started on BiPap therapy, antibiotics and admitted to the ICU. He was placed on IV Cardizem. His home dose of Metoprolol, 100 mg BID was continued, as was Eliquis. A repeat 2D echo was obtained and reveals a notable decrease in systolic function compared to prior study with new EF at 20-25% (40-45% 09/2012). Diffuse hypokinesis was noted. Now actively receiving diuresis    Assessment/Plan: #1 acute respiratory failure Likely multifactorial secondary to acute on chronic CHF exacerbation, atrial fibrillation with RVR, community acquired pneumonia. Clinical improvement. Patient had 96% on room air. Patient with some cough. Urine Legionella is negative. Urine pneumococcus is negative. I/O= -997/24 hours. WBC trending down and now normalized. Continue empiric Augmentin, IV Lasix, amiodarone, Lopressor, Mucinex. Cardiology followed as well. Follow.  #2 right upper lobe community-acquired pneumonia Clinical improvement. Patient is afebrile. WBC is normalized. Continue oral Augmentin day out of 8. Continue Mucinex. Inhalers. Follow.  #3 COPD Currently stable. Continue daily prednisone, albuterol nebs as needed, Spiriva, Pulmicort, flutter valve, Mucinex.  #4. A. fib with RVR Patient with heart rate in the 90s to low 100s. Continue amiodarone and Lopressor for rate control. Eliquis for  anticoagulation. Patient is being seen by cardiology and cardiologist to determine when cardioversion while in house.   #5 acute on chronic systolic heart failure/cardiomyopathy 2-D echo 01/27/2014 with EF of 20-25% with diffuse hypokinesis. Cardiac enzymes negative x2. I/O = -5.14673ml during this hospitalization. Patient still with 2-3+ bilateral lower extremity edema and some scattered crackles on clinical exam. Continue Lopressor, IV Lasix. Cardiology following and appreciate their recommendations.  #6 BPH/urinary retention/hematuria Hematuria is improved. Patient status post Foley catheter. Voiding trial today. Neurology following.  #7 hypokalemia Resolved. Potassium at 5 today. Follow closely.  #8 gastroesophageal reflux disease/history of Barrett's esophagus/consent for recurrent aspiration Continue current diet as per speech therapist. Continue PPI.  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 Continue eliquis for secondary stroke prevention.  #10 mild dementia Neurontin on hold. Minimize sedating drugs.  #11 history of complete heart block status post PPM Stable.  #12 prophylaxis PPI for GI prophylaxis. Eliquis for DVT prophylaxis.  Code Status: Full Family Communication: Updated patient and son at bedside. Disposition Plan: SNF when medically stable.   Consultants:  Cardiology: 01/28/2014  Urology 01/30/2014  Procedures:  2-D echo 01/27/2014  Chest x-ray 01/27/2014, 01/28/2014, 01/31/2014  Antibiotics:  Augmentin 01/30/2014  IV azithromycin 01/27/2014 >>> 01/30/2014  IV Rocephin 01/27/2014>>>>> 01/30/2014  HPI/Subjective: Patient states shortness of breath has improved. Patient denies any chest pain.  Objective: Filed Vitals:   02/03/14 0636  BP: 134/81  Pulse: 94  Temp: 97.9 F (36.6 C)  Resp: 18    Intake/Output Summary (Last 24 hours) at 02/03/14 1116 Last data filed at 02/03/14 0800  Gross per 24 hour   Intake    480 ml  Output  1077 ml  Net   -597 ml   Filed Weights   02/01/14 0554 02/02/14 0550 02/03/14 0636  Weight: 104.1 kg (229 lb 8 oz) 103.8 kg (228 lb 13.4 oz) 103.7 kg (228 lb 9.9 oz)    Exam:   General:  nad  Cardiovascular: Irregularly irregular.  Respiratory: Scattered crackles, occasional coarse breath sounds. No wheezing.  Abdomen: Soft, distended, positive bowel sounds, nontender to palpation.  Musculoskeletal: No clubbing/cyanosis. 3+ bilateral lower extremity edema. Psoriatic plaques noted.  Data Reviewed: Basic Metabolic Panel:  Recent Labs Lab 01/29/14 0310 01/30/14 0432 01/31/14 0450 02/02/14 0440 02/02/14 1216 02/03/14 0915  NA 139 139 135* 136*  --  134*  K 3.8 3.5* 3.8 6.1* 3.6* 5.0  CL 98 99 98 97  --  96  CO2 25 23 22 25   --  21  GLUCOSE 131* 101* 93 77  --  78  BUN 28* 31* 28* 31*  --  35*  CREATININE 1.44* 1.19 1.01 1.10  --  1.15  CALCIUM 8.3* 8.3* 7.8* 7.5*  --  7.8*  MG  --  2.2  --  2.4  --   --    Liver Function Tests:  Recent Labs Lab 01/27/14 1200 01/30/14 0432  AST 22 18  ALT 35 22  ALKPHOS 55 71  BILITOT 2.1* 2.0*  PROT 6.3 6.3  ALBUMIN 3.5 2.8*   No results found for this basename: LIPASE, AMYLASE,  in the last 168 hours No results found for this basename: AMMONIA,  in the last 168 hours CBC:  Recent Labs Lab 01/28/14 0258 01/29/14 0310 01/30/14 0432 01/31/14 0450 02/03/14 0915  WBC 17.6* 13.9* 10.6* 7.8 6.7  NEUTROABS  --  11.5* 8.6*  --   --   HGB 13.6 13.8 13.9 13.5 14.6  HCT 40.9 40.8 42.2 41.1 43.8  MCV 85.2 85.2 87.2 84.6 86.7  PLT 149* 163 154 127* 198   Cardiac Enzymes:  Recent Labs Lab 01/27/14 1320 01/27/14 1934  TROPONINI <0.30 <0.30   BNP (last 3 results)  Recent Labs  04/23/13 1221 01/27/14 0929 02/03/14 0915  PROBNP 1516.00* 6726.0* 3422.0*   CBG:  Recent Labs Lab 01/27/14 1558  GLUCAP 211*    Recent Results (from the past 240 hour(s))  URINE CULTURE     Status:  None   Collection Time    01/27/14  9:05 AM      Result Value Ref Range Status   Specimen Description URINE, CATHETERIZED   Final   Special Requests NONE   Final   Culture  Setup Time     Final   Value: 01/27/2014 14:30     Performed at [employer] [facility] Count     Final   Value: NO GROWTH     Performed at [facility]   Culture     Final   Value: NO GROWTH     Performed at [facility]   Report Status 01/28/2014 FINAL   Final  CULTURE, BLOOD (ROUTINE X 2)     Status: None   Collection Time    01/27/14  9:20 AM      Result Value Ref Range Status   Specimen Description BLOOD RIGHT FOREARM   Final   Special Requests BOTTLES DRAWN AEROBIC ONLY 10CC   Final   Culture  Setup Time     Final   Value: 01/27/2014 14:12     Performed at [facility]  Final   Value: NO GROWTH 5 DAYS     Performed at [facility]   Report Status 02/02/2014 FINAL   Final  CULTURE, BLOOD (ROUTINE X 2)     Status: None   Collection Time    01/27/14  9:29 AM      Result Value Ref Range Status   Specimen Description BLOOD LEFT ANTECUBITAL   Final   Special Requests BOTTLES DRAWN AEROBIC ONLY 5CC   Final   Culture  Setup Time     Final   Value: 01/27/2014 14:12     Performed at [facility]   Culture     Final   Value: NO GROWTH 5 DAYS     Performed at [facility]   Report Status 02/02/2014 FINAL   Final  MRSA PCR SCREENING     Status: None   Collection Time    01/27/14 11:05 AM      Result Value Ref Range Status   MRSA by PCR NEGATIVE  NEGATIVE Final   Comment:            The GeneXpert MRSA Assay (FDA     approved for NASAL specimens     only), is one component of a     comprehensive MRSA colonization     surveillance program. It is not     intended to diagnose MRSA     infection nor to guide or     monitor treatment for     MRSA infections.     Studies: No results found.  Scheduled Meds: . amiodarone  400 mg Oral  BID  . amoxicillin-clavulanate  1 tablet Oral Q12H  . apixaban  5 mg Oral BID  . budesonide (PULMICORT) nebulizer solution  0.25 mg Nebulization BID  . dextromethorphan-guaiFENesin  1 tablet Oral BID  . docusate sodium  100 mg Oral BID  . furosemide  40 mg Intravenous BID  . metoprolol tartrate  75 mg Oral TID  . nystatin  5 mL Oral QID  . pantoprazole  40 mg Oral Q1200  . polyethylene glycol  17 g Oral Daily  . predniSONE  5 mg Oral Q breakfast  . tamsulosin  0.4 mg Oral QPC supper  . tiotropium  18 mcg Inhalation Daily   Continuous Infusions: . sodium chloride 10 mL/hr at 01/28/14 0600    Principal Problem:   Acute respiratory failure Active Problems:   Atrial fibrillation with RVR   Acute on chronic systolic HF (heart failure)   Hypercholesteremia   COPD (chronic obstructive pulmonary disease)   GERD (gastroesophageal reflux disease)   Pacemaker, St Jude, implanted 2008   CKD (chronic kidney disease) stage 2, GFR 60-89 ml/min   CAP (community acquired pneumonia)   Urinary retention   Cardiomyopathy    Time spent: 40 MINS    [facility] MD [facility] Pager [phone]. If 7PM-7AM, please contact night-coverage at [email or web address], password [facility] 02/03/2014, 11:16 AM  LOS: 7 days

## 2014-02-03 NOTE — Progress Notes (Signed)
Clinical Social Work Department BRIEF PSYCHOSOCIAL ASSESSMENT 02/03/2014  Patient:  Richard Chavez, Richard Chavez     Account Number:  [id number]     Admit date:  01/27/2014  Clinical Social Worker:  Harless Nakayama  Date/Time:  02/03/2014 11:00 AM  Referred by:  Physician  Date Referred:  02/03/2014 Referred for  SNF Placement   Other Referral:   Interview type:  Family Other interview type:   Spoke with pt son at bedside    PSYCHOSOCIAL DATA Living Status:  ALONE Admitted from facility:   Level of care:   Primary support name:  Janeece Riggers Primary support relationship to patient:  CHILD, ADULT Degree of support available:   Pt has supportive family    CURRENT CONCERNS Current Concerns  Post-Acute Placement   Other Concerns:    SOCIAL WORK ASSESSMENT / PLAN CSW made aware of PT recommendation and that pt is ready for dc potentially today. CSW visited pt room and spoke with pt son at bedside. Pt not oriented to time or situation during assessment and did not participate in conversation. CSW exlained recommendaiton to pt son who said he was aware and agreeable to SNF. Pt has not been to SNF before and pt son has no preferences at this time. CSW explained SNF referral process and pt son is agreeable to referral being sent to [facility].   Assessment/plan status:  Psychosocial Support/Ongoing Assessment of Needs Other assessment/ plan:   Information/referral to community resources:   SNF list to be provided with bed offers    PATIENT'S/FAMILY'S RESPONSE TO PLAN OF CARE: Pt son agreeable to SNF       Jennifer Payes, LCSWA [phone]

## 2014-02-03 NOTE — Progress Notes (Addendum)
Clinical Social Work Department CLINICAL SOCIAL WORK PLACEMENT NOTE 02/03/2014  Patient:  Richard Chavez, Richard Chavez  Account Number:  [id number] Admit date:  01/27/2014  Clinical Social Worker:  Harless Nakayama  Date/time:  02/03/2014 11:00 AM  Clinical Social Work is seeking post-discharge placement for this patient at the following level of care:   SKILLED NURSING   (*CSW will update this form in Epic as items are completed)   02/03/2014  Patient/family provided with [facility] Department of Clinical Social Work's list of facilities offering this level of care within the geographic area requested by the patient (or if unable, by the patient's family).  02/03/2014  Patient/family informed of their freedom to choose among providers that offer the needed level of care, that participate in Medicare, Medicaid or managed care program needed by the patient, have an available bed and are willing to accept the patient.  02/03/2014  Patient/family informed of [facility]' ownership interest in [facility], as well as of the fact that they are under no obligation to receive care at this facility.  PASARR submitted to EDS on 02/03/2014 PASARR number received on 02/03/2014  FL2 transmitted to all facilities in geographic area requested by pt/family on  02/03/2014 FL2 transmitted to all facilities within larger geographic area on   Patient informed that his/her managed care company has contracts with or will negotiate with  certain facilities, including the following:     Patient/family informed of bed offers received:  02/04/2014 Patient chooses bed at -- Physician recommends and patient chooses bed at    Patient to be transferred to  HOME on   Patient to be transferred to facility by FAMILY Patient and family notified of transfer on  Name of family member notified:    The following physician request were entered in Epic: Physician Request  Please sign FL2.    Additional  CommentsSharol Harness, LCSWA [phone]

## 2014-02-04 ENCOUNTER — Inpatient Hospital Stay (HOSPITAL_COMMUNITY): Payer: Medicare Other

## 2014-02-04 ENCOUNTER — Ambulatory Visit: Payer: Medicare Other | Admitting: Neurology

## 2014-02-04 DIAGNOSIS — E876 Hypokalemia: Secondary | ICD-10-CM

## 2014-02-04 DIAGNOSIS — I5023 Acute on chronic systolic (congestive) heart failure: Principal | ICD-10-CM

## 2014-02-04 DIAGNOSIS — J9601 Acute respiratory failure with hypoxia: Secondary | ICD-10-CM

## 2014-02-04 DIAGNOSIS — I4891 Unspecified atrial fibrillation: Secondary | ICD-10-CM

## 2014-02-04 DIAGNOSIS — N183 Chronic kidney disease, stage 3 (moderate): Secondary | ICD-10-CM

## 2014-02-04 DIAGNOSIS — N182 Chronic kidney disease, stage 2 (mild): Secondary | ICD-10-CM

## 2014-02-04 DIAGNOSIS — J189 Pneumonia, unspecified organism: Secondary | ICD-10-CM

## 2014-02-04 LAB — BASIC METABOLIC PANEL
ANION GAP: 18 — AB (ref 5–15)
ANION GAP: 15 (ref 5–15)
BUN: 34 mg/dL — ABNORMAL HIGH (ref 6–23)
BUN: 32 mg/dL — ABNORMAL HIGH (ref 6–23)
CALCIUM: 8.1 mg/dL — AB (ref 8.4–10.5)
CHLORIDE: 99 meq/L (ref 96–112)
CO2: 24 mEq/L (ref 19–32)
CO2: 23 meq/L (ref 19–32)
CREATININE: 1.35 mg/dL (ref 0.50–1.35)
Calcium: 7.9 mg/dL — ABNORMAL LOW (ref 8.4–10.5)
Chloride: 97 mEq/L (ref 96–112)
Creatinine, Ser: 1.2 mg/dL (ref 0.50–1.35)
GFR calc Af Amer: 62 mL/min — ABNORMAL LOW (ref >90)
GFR calc non Af Amer: 46 mL/min — ABNORMAL LOW (ref >90)
GFR calc non Af Amer: 53 mL/min — ABNORMAL LOW (ref >90)
GFR, EST AFRICAN AMERICAN: 54 mL/min — AB (ref >90)
Glucose, Bld: 87 mg/dL (ref 70–99)
Glucose, Bld: 117 mg/dL — ABNORMAL HIGH (ref 70–99)
POTASSIUM: 2.9 meq/L — AB (ref 3.7–5.3)
Potassium: 4.3 mEq/L (ref 3.7–5.3)
SODIUM: 137 meq/L (ref 137–147)
Sodium: 139 mEq/L (ref 137–147)

## 2014-02-04 LAB — CBC
HCT: 43 % (ref 39.0–52.0)
Hemoglobin: 14.4 g/dL (ref 13.0–17.0)
MCH: 28.7 pg (ref 26.0–34.0)
MCHC: 33.5 g/dL (ref 30.0–36.0)
MCV: 85.8 fL (ref 78.0–100.0)
Platelets: 197 10*3/uL (ref 150–400)
RBC: 5.01 MIL/uL (ref 4.22–5.81)
RDW: 15.9 % — ABNORMAL HIGH (ref 11.5–15.5)
WBC: 7.4 10*3/uL (ref 4.0–10.5)

## 2014-02-04 MED ORDER — SODIUM CHLORIDE 0.9 % IJ SOLN: 3.0000 mL | INTRAMUSCULAR | Status: DC | PRN

## 2014-02-04 MED ORDER — POTASSIUM CHLORIDE CRYS ER 20 MEQ PO TBCR
40.0000 meq | EXTENDED_RELEASE_TABLET | Freq: Once | ORAL | Status: AC
  Administered 2014-02-04: 40 meq via ORAL

## 2014-02-04 MED ORDER — SODIUM CHLORIDE 0.9 % IJ SOLN
3.0000 mL | Freq: Two times a day (BID) | INTRAMUSCULAR | Status: DC
Start: 2014-02-04 — End: 2014-02-05
  Administered 2014-02-04 (×2): 3 mL via INTRAVENOUS

## 2014-02-04 MED ORDER — POTASSIUM CHLORIDE CRYS ER 20 MEQ PO TBCR
40.0000 meq | EXTENDED_RELEASE_TABLET | Freq: Once | ORAL | Status: AC
  Administered 2014-02-04: 40 meq via ORAL
  Filled 2014-02-04: qty 2

## 2014-02-04 MED ORDER — SODIUM CHLORIDE 0.9 % IV SOLN: INTRAVENOUS | Status: DC

## 2014-02-04 MED ORDER — SODIUM CHLORIDE 0.9 % IV SOLN
250.0000 mL | INTRAVENOUS | Status: DC
Start: 2014-02-04 — End: 2014-02-05

## 2014-02-04 NOTE — Progress Notes (Signed)
SLP Cancellation Note  Patient Details Name: Richard Chavez MRN: [id number] DOB: 01/08/1927   Cancelled treatment:       Reason Eval/Treat Not Completed:  (pt declined visit and per chart is npo except medications, tray at bedside )  Donavan Burnet, MS [facility] SLP [phone]

## 2014-02-04 NOTE — Progress Notes (Signed)
[facility] PROGRESS NOTE  Richard Chavez ZOX:[id number] DOB: 1926-08-24 DOA: 01/27/2014 PCP: Thayer Headings, MD   Admit HPI / Brief Narrative:  Admitted to [facility] on 01/27/14 by Critical Care for CAP and acute CHF and was noted to be in atrial fibrillation with RVR.  On arrival to the ED yesterday, CXR revealed cardiomegaly and Infiltrate/ consolidation in right upper lobe highly suspicious for pneumonia. WBC was WNL on admit but now elevated at 17.6. BNP was elevated at 6726. Troponin negative x 3. He was hypokalemic with K at 3.4. Scr 1.79. EKG demonstrated Afib with ventricular response in the 130s. He was started on BiPap therapy, antibiotics and admitted to the ICU. He was placed on IV Cardizem. His home dose of Metoprolol, 100 mg BID was continued, as was Eliquis. A repeat 2D echo was obtained and reveals a notable decrease in systolic function compared to prior study with new EF at 20-25% (40-45% 09/2012). Diffuse hypokinesis was noted. Now actively receiving diuresis    Assessment/Plan: Acute respiratory failure - Multifactorial secondary to acute on chronic CHF exacerbation, atrial fibrillation with RVR, community acquired pneumonia.  -  I/O= -997/24 hours. WBC trending down and now normalized.  - Continue empiric Augmentin, IV Lasix, amiodarone, Lopressor, Mucinex. Cardiology followed as well. Follow.  Right upper lobe community-acquired pneumonia - Clinical improvement. Patient is afebrile. WBC is normalized, has remained afebrile. - Completed abx course.  COPD Currently stable. Continue daily prednisone, albuterol nebs as needed, Spiriva, Pulmicort, flutter valve, Mucinex.  A. fib with RVR - Patient with heart rate in the 90s to low 100s.  - Continue amiodarone and Lopressor for rate control.  - Eliquis for anticoagulation. - Patient is being seen by cardiology [facility] on 10.2.2015. K repleted by cards.  Acute on chronic systolic heart failure/cardiomyopathy - 2-D echo  01/27/2014 with EF of 20-25% with diffuse hypokinesis.  - Cardiac enzymes negative x2. I/O = -5.173ml during this hospitalization.  - held lasix.  BPH/urinary retention/hematuria - Hematuria is improved.   Hypokalemia: - replete recheck.  Gastroesophageal reflux disease/history of Barrett's esophagus/consent for recurrent aspiration - Continue current diet as per speech therapist. Continue PPI.  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 Continue eliquis for secondary stroke prevention.  Mild dementia Neurontin on hold. Minimize sedating drugs.  History of complete heart block status post PPM Stable.  #12 prophylaxis PPI for GI prophylaxis. Eliquis for DVT prophylaxis.  Code Status: Full Family Communication: Updated patient and son at bedside. Disposition Plan: SNF when medically stable.   Consultants:  Cardiology: 01/28/2014  Urology 01/30/2014  Procedures:  2-D echo 01/27/2014  Chest x-ray 01/27/2014, 01/28/2014, 01/31/2014  Antibiotics:  Augmentin 01/30/2014  IV azithromycin 01/27/2014 >>> 01/30/2014  IV Rocephin 01/27/2014>>>>> 01/30/2014  HPI/Subjective: Patient states shortness of breath has improved.   Objective: Filed Vitals:   02/04/14 0452  BP: 95/74  Pulse: 80  Temp: 97.7 F (36.5 C)  Resp: 20    Intake/Output Summary (Last 24 hours) at 02/04/14 1016 Last data filed at 02/04/14 0500  Gross per 24 hour  Intake    520 ml  Output   1400 ml  Net   -880 ml   Filed Weights   02/02/14 0550 02/03/14 0636 02/04/14 0452  Weight: 103.8 kg (228 lb 13.4 oz) 103.7 kg (228 lb 9.9 oz) 102.1 kg (225 lb 1.4 oz)    Exam:   General:  nad  Cardiovascular: Irregularly irregular.  Respiratory: good air movement CTA  B/L  Abdomen: Soft, distended, positive bowel sounds, nontender to palpation.  Musculoskeletal: No clubbing/cyanosis. 3+ bilateral lower extremity edema. Psoriatic plaques noted.  Data  Reviewed: Basic Metabolic Panel:  Recent Labs Lab 01/30/14 0432 01/31/14 0450 02/02/14 0440 02/02/14 1216 02/03/14 0915 02/04/14 0500  NA 139 135* 136*  --  134* 139  K 3.5* 3.8 6.1* 3.6* 5.0 2.9*  CL 99 98 97  --  96 97  CO2 23 22 25   --  21 24  GLUCOSE 101* 93 77  --  78 87  BUN 31* 28* 31*  --  35* 34*  CREATININE 1.19 1.01 1.10  --  1.15 1.35  CALCIUM 8.3* 7.8* 7.5*  --  7.8* 7.9*  MG 2.2  --  2.4  --   --   --    Liver Function Tests:  Recent Labs Lab 01/30/14 0432  AST 18  ALT 22  ALKPHOS 71  BILITOT 2.0*  PROT 6.3  ALBUMIN 2.8*   No results found for this basename: LIPASE, AMYLASE,  in the last 168 hours No results found for this basename: AMMONIA,  in the last 168 hours CBC:  Recent Labs Lab 01/29/14 0310 01/30/14 0432 01/31/14 0450 02/03/14 0915 02/04/14 0500  WBC 13.9* 10.6* 7.8 6.7 7.4  NEUTROABS 11.5* 8.6*  --   --   --   HGB 13.8 13.9 13.5 14.6 14.4  HCT 40.8 42.2 41.1 43.8 43.0  MCV 85.2 87.2 84.6 86.7 85.8  PLT 163 154 127* 198 197   Cardiac Enzymes: No results found for this basename: CKTOTAL, CKMB, CKMBINDEX, TROPONINI,  in the last 168 hours BNP (last 3 results)  Recent Labs  04/23/13 1221 01/27/14 0929 02/03/14 0915  PROBNP 1516.00* 6726.0* 3422.0*   CBG: No results found for this basename: GLUCAP,  in the last 168 hours  Recent Results (from the past 240 hour(s))  URINE CULTURE     Status: None   Collection Time    01/27/14  9:05 AM      Result Value Ref Range Status   Specimen Description URINE, CATHETERIZED   Final   Special Requests NONE   Final   Culture  Setup Time     Final   Value: 01/27/2014 14:30     Performed at [facility] Count     Final   Value: NO GROWTH     Performed at [facility]   Culture     Final   Value: NO GROWTH     Performed at [facility]   Report Status 01/28/2014 FINAL   Final  CULTURE, BLOOD (ROUTINE X 2)     Status: None   Collection Time    01/27/14   9:20 AM      Result Value Ref Range Status   Specimen Description BLOOD RIGHT FOREARM   Final   Special Requests BOTTLES DRAWN AEROBIC ONLY 10CC   Final   Culture  Setup Time     Final   Value: 01/27/2014 14:12     Performed at [facility]   Culture     Final   Value: NO GROWTH 5 DAYS     Performed at [facility]   Report Status 02/02/2014 FINAL   Final  CULTURE, BLOOD (ROUTINE X 2)     Status: None   Collection Time    01/27/14  9:29 AM      Result Value Ref Range Status   Specimen  Description BLOOD LEFT ANTECUBITAL   Final   Special Requests BOTTLES DRAWN AEROBIC ONLY 5CC   Final   Culture  Setup Time     Final   Value: 01/27/2014 14:12     [facility]   Culture     Final   Value: NO GROWTH 5 DAYS     [facility]   Report Status 02/02/2014 FINAL   Final  MRSA PCR SCREENING     Status: None   Collection Time    01/27/14 11:05 AM      Result Value Ref Range Status   MRSA by PCR NEGATIVE  NEGATIVE Final   Comment:            The GeneXpert MRSA Assay (FDA     approved for NASAL specimens     only), is one component of a     comprehensive MRSA colonization     surveillance program. It is not     intended to diagnose MRSA     infection nor to guide or     monitor treatment for     MRSA infections.     Studies: No results found.  Scheduled Meds: . amiodarone  400 mg Oral BID  . apixaban  5 mg Oral BID  . budesonide (PULMICORT) nebulizer solution  0.25 mg Nebulization BID  . dextromethorphan-guaiFENesin  1 tablet Oral BID  . docusate sodium  100 mg Oral BID  . metoprolol tartrate  75 mg Oral TID  . nystatin  5 mL Oral QID  . pantoprazole  40 mg Oral Q1200  . polyethylene glycol  17 g Oral Daily  . potassium chloride  40 mEq Oral Once  . potassium chloride  40 mEq Oral Once  . predniSONE  5 mg Oral Q breakfast  . sodium chloride  3 mL Intravenous Q12H  . tamsulosin  0.4 mg Oral QPC supper  . tiotropium   18 mcg Inhalation Daily   Continuous Infusions: . sodium chloride 10 mL/hr at 01/28/14 0600  . sodium chloride    . sodium chloride      Principal Problem:   Acute respiratory failure Active Problems:   Hypercholesteremia   COPD (chronic obstructive pulmonary disease)   GERD (gastroesophageal reflux disease)   Pacemaker, St Jude, implanted 2008   CKD (chronic kidney disease) stage 2, GFR 60-89 ml/min   CAP (community acquired pneumonia)   Atrial fibrillation with RVR   Acute on chronic systolic HF (heart failure)   Urinary retention   Cardiomyopathy    Time spent: 40 MINS    Marinda ElkFELIZ ORTIZ, ABRAHAM MD [facility] Pager [phone]. If 7PM-7AM, please contact night-coverage at [email or web address], password [email or web address] 02/04/2014, 10:16 AM  LOS: 8 days

## 2014-02-04 NOTE — Progress Notes (Signed)
BSW Intern met with patient and son [name]. Intern gave pt SNF bed offers for their review. Patient and son were notified of possible discharge Saturday. Social worker will follow up tomorrow with pt regarding bed choice. Pt and son verbalized understanding of above.  Samantha Dollarhite, BSW Intern  I have reviewed and agree with above note as indicated.   T. , LCSW  [phone]  Intern Supervisor    

## 2014-02-04 NOTE — Progress Notes (Signed)
The patient is lying flat in bed. He denies dyspnea. He has been on amiodarone for an ample period of time such that cardioversion would now be arranged for tomorrow. We will replete his potassium. Will hold diuretic therapy today. We will check potassium later this evening.  The above is accurate and done under my supervision.

## 2014-02-04 NOTE — Progress Notes (Signed)
Patient Name: Richard ShuttersFred M Chavez Date of Encounter: 02/04/2014     Principal Problem:   Acute respiratory failure Active Problems:   Hypercholesteremia   COPD (chronic obstructive pulmonary disease)   GERD (gastroesophageal reflux disease)   Pacemaker, St Jude, implanted 2008   CKD (chronic kidney disease) stage 2, GFR 60-89 ml/min   CAP (community acquired pneumonia)   Atrial fibrillation with RVR   Acute on chronic systolic HF (heart failure)   Urinary retention   Cardiomyopathy    SUBJECTIVE  Breathing much better but just feeling overall weak.   CURRENT MEDS . amiodarone  400 mg Oral BID  . amoxicillin-clavulanate  1 tablet Oral Q12H  . apixaban  5 mg Oral BID  . budesonide (PULMICORT) nebulizer solution  0.25 mg Nebulization BID  . dextromethorphan-guaiFENesin  1 tablet Oral BID  . docusate sodium  100 mg Oral BID  . furosemide  40 mg Intravenous BID  . metoprolol tartrate  75 mg Oral TID  . nystatin  5 mL Oral QID  . pantoprazole  40 mg Oral Q1200  . polyethylene glycol  17 g Oral Daily  . predniSONE  5 mg Oral Q breakfast  . tamsulosin  0.4 mg Oral QPC supper  . tiotropium  18 mcg Inhalation Daily    OBJECTIVE  Filed Vitals:   02/03/14 1949 02/03/14 2142 02/04/14 0452 02/04/14 0709  BP: 115/83  95/74   Pulse: 99  80   Temp: 97.5 F (36.4 C)  97.7 F (36.5 C)   TempSrc: Oral  Oral   Resp: 20  20   Height:      Weight:   225 lb 1.4 oz (102.1 kg)   SpO2: 96% 96% 93% 97%    Intake/Output Summary (Last 24 hours) at 02/04/14 0846 Last data filed at 02/04/14 0500  Gross per 24 hour  Intake    520 ml  Output   1400 ml  Net   -880 ml   Filed Weights   02/02/14 0550 02/03/14 0636 02/04/14 0452  Weight: 228 lb 13.4 oz (103.8 kg) 228 lb 9.9 oz (103.7 kg) 225 lb 1.4 oz (102.1 kg)    PHYSICAL EXAM  General: A./O. x2, NAD, improvement in breathing Lungs: decreased BS at bases, fine crackles and positive wheezing  Cardiovascular: Irregular irregular rhythm  and rate without murmur gallop or rub normal S1 and S2  Abdomen: Nontender, nondistended, soft, bowel sounds positive, no rebound, no ascites, no appreciable mass  Extremities: No significant cyanosis, clubbing/ 2 +  edema bilateral lower extremities  Neurologic; cranial nerves 2-12 intact, pupils equal round reactive to light and accommodation, patient moves all extremities to command, sensation intact   Accessory Clinical Findings  CBC  Recent Labs  02/03/14 0915 02/04/14 0500  WBC 6.7 7.4  HGB 14.6 14.4  HCT 43.8 43.0  MCV 86.7 85.8  PLT 198 197   Basic Metabolic Panel  Recent Labs  02/02/14 0440  02/03/14 0915 02/04/14 0500  NA 136*  --  134* 139  K 6.1*  < > 5.0 2.9*  CL 97  --  96 97  CO2 25  --  21 24  GLUCOSE 77  --  78 87  BUN 31*  --  35* 34*  CREATININE 1.10  --  1.15 1.35  CALCIUM 7.5*  --  7.8* 7.9*  MG 2.4  --   --   --   < > = values in this interval not displayed.  TELE  afib with rate control. Freq PVCs.  Radiology/Studies  Dg Chest 2 View  01/31/2014   CLINICAL DATA:  Shortness of breath and vascular congestion, followup, history hypertension, coronary artery disease post MI, chronic renal failure, COPD, GERD, ischemic cardiomyopathy  EXAM: CHEST  2 VIEW  COMPARISON:  01/28/2014  FINDINGS: RIGHT subclavian sequential pacemaker leads project at RIGHT atrium and RIGHT ventricle.  Enlargement of cardiac silhouette post CABG.  Atherosclerotic calcification aortic arch.  Stable mediastinal contours.  Persistent consolidation RIGHT upper lobe.  Pulmonary vascular congestion accentuation of pulmonary markings throughout remainder of lungs appear stable.  No additional infiltrate, pleural effusion or pneumothorax.  IMPRESSION: Persistent RIGHT upper lobe consolidation consistent with pneumonia.  Enlargement of cardiac silhouette post pacemaker and CABG.   Electronically Signed   By: Ulyses Southward M.D.   On: 01/31/2014 08:27   Dg Chest Port 1 View  01/28/2014    CLINICAL DATA:  Evaluate pneumonia  EXAM: PORTABLE CHEST - 1 VIEW  COMPARISON:  01/27/2014  FINDINGS: Persistent right upper lobe pneumonia is identified although mild increase in aeration in the right upper lobe is noted. The lungs are otherwise within normal limits. Postsurgical changes are seen. Cardiac shadow is stable. No acute bony abnormality is noted. A pacing device is again seen.  IMPRESSION: Persistent but mildly improved right upper lobe pneumonia.   Electronically Signed   By: Alcide Clever M.D.   On: 01/28/2014 07:05   Dg Chest Portable 1 View  01/27/2014   CLINICAL DATA:  History of CVA  EXAM: PORTABLE CHEST - 1 VIEW  COMPARISON:  04/01/2013  FINDINGS: Cardiomegaly is noted. Dual lead cardiac pacemaker is unchanged in position. Status post median sternotomy. There is infiltrate/ consolidation in right upper lobe highly suspicious for pneumonia. Follow-up to resolution is recommended. No pulmonary edema.  IMPRESSION: Cardiomegaly. Dual lead cardiac pacemaker in place. Infiltrate/ consolidation in right upper lobe highly suspicious for pneumonia. Follow-up to resolution is recommended.   Electronically Signed   By: Natasha Mead M.D.   On: 01/27/2014 08:59    ASSESSMENT AND PLAN  Richard Chavez is a 80 y.o. male with a history of A-Fib, CAD s/p CABG, chronic systolic CHF, HTN, CKD, and CHB s/p PPM who was admitted on 01/27/14 with dyspnea and was diagnosed with pneumonia. Pacer was interrogated and he was found to have AFib for the past 79 days - did not appear to be related to the acute illness.   Persistent atrial fibrillation, onset over 2 months ago by pacer interrogation. Currently rate controlled. -- Continue Eliquis 5mg  BID, IV Lasix 40mg  BID, Lopressor 75mg  TID, amiodarone 400mg  BID. Amiodarone was started on 01/29/14, initially intravenously, and subsequently 400 mg daily until the present time. Goal is to maximize amiodarone loading and optimize heart failure prior to DCCV. -- Plan for  DCCV tomorrow. No need for TEE as he has been on long term anticoagulation with Eliquis.  Cardiomyopathy/CHF, LVEF 20-25% by most recent echocardiogram, diffuse hypokinesis. Represents decrease compared to prior testing, question tachycardia mediated. -- Consider outpatient ischemic testing once more stable.  -- Currently on Lasix 40mg  IV BID. Creat slightly bumped to 1.35. GFR 54.  Net neg 6L. Weight 243lbs--> 225 lbs today.  -- Suspect chronic LE edema as it has been documented as a problem in the past when he was euvolemic. He is breathing well and laying flat. However, he is feeling weak and slightly hypotensive SBP 95. May be too dry now. Will discontinue Lasix for now  and re-assess tomorrow.   Acute respiratory failure- Now resolved. Initially requring BiPap and admitted to the ICU -- Likely multifactorial secondary to acute on chronic CHF exacerbation, atrial fibrillation with RVR, community acquired pneumonia. -- Continue Abx per IM.  CAD status post previous CABG. Had patent grafts in 201.  No active angina.  -- Troponin neg  History of complete heart block, status post St. Jude pacemaker.    Hypokaemia- K 2.9. Potassium levels all over the board. 6.1-->3.6--> 5.0--> 2.9. Will supplement carefully. Follow BMET -- Placed an order for Kdur x2 today.   Chronic renal insufficiency- GFR ~70 at baseline.  Kidney function slightly worsening with diuresis. Creat 1.35 today and GFR 54.  -- Discontinuing Lasix today. Continue to monitor.   Billy Fischer PA-C  Pager [phone]

## 2014-02-04 NOTE — Progress Notes (Signed)
UR completed Kaytlynn Kochan K. Armoni Depass, RN, BSN, MSHL, CCM  02/04/2014 3:08 PM

## 2014-02-05 ENCOUNTER — Encounter (HOSPITAL_COMMUNITY): Payer: Self-pay | Admitting: *Deleted

## 2014-02-05 ENCOUNTER — Encounter (HOSPITAL_COMMUNITY)
Admission: EM | Disposition: A | Discharge status: Home Health | Payer: Self-pay | Source: Home / Self Care | Attending: Internal Medicine

## 2014-02-05 DIAGNOSIS — I4892 Unspecified atrial flutter: Secondary | ICD-10-CM

## 2014-02-05 DIAGNOSIS — I5042 Chronic combined systolic (congestive) and diastolic (congestive) heart failure: Secondary | ICD-10-CM

## 2014-02-05 DIAGNOSIS — G8110 Spastic hemiplegia affecting unspecified side: Secondary | ICD-10-CM

## 2014-02-05 DIAGNOSIS — G4733 Obstructive sleep apnea (adult) (pediatric): Secondary | ICD-10-CM

## 2014-02-05 DIAGNOSIS — I25810 Atherosclerosis of coronary artery bypass graft(s) without angina pectoris: Secondary | ICD-10-CM

## 2014-02-05 DIAGNOSIS — Z950 Presence of cardiac pacemaker: Secondary | ICD-10-CM

## 2014-02-05 LAB — BASIC METABOLIC PANEL
Anion gap: 14 (ref 5–15)
BUN: 31 mg/dL — AB (ref 6–23)
CALCIUM: 8.3 mg/dL — AB (ref 8.4–10.5)
CHLORIDE: 100 meq/L (ref 96–112)
CO2: 23 meq/L (ref 19–32)
CREATININE: 1.26 mg/dL (ref 0.50–1.35)
GFR calc Af Amer: 58 mL/min — ABNORMAL LOW (ref >90)
GFR calc non Af Amer: 50 mL/min — ABNORMAL LOW (ref >90)
GLUCOSE: 94 mg/dL (ref 70–99)
Potassium: 3.3 mEq/L — ABNORMAL LOW (ref 3.7–5.3)
Sodium: 137 mEq/L (ref 137–147)

## 2014-02-05 SURGERY — Surgical Case

## 2014-02-05 MED ORDER — POTASSIUM CHLORIDE 20 MEQ/15ML (10%) PO LIQD
20.0000 meq | Freq: Every day | ORAL | Status: DC
  Filled 2014-02-05: qty 15

## 2014-02-05 MED ORDER — POTASSIUM CHLORIDE CRYS ER 20 MEQ PO TBCR: 40.0000 meq | EXTENDED_RELEASE_TABLET | Freq: Two times a day (BID) | ORAL | Status: DC

## 2014-02-05 MED ORDER — ACETAMINOPHEN 325 MG PO TABS
650.0000 mg | ORAL_TABLET | Freq: Four times a day (QID) | ORAL | Status: DC | PRN
  Filled 2014-02-05: qty 2

## 2014-02-05 MED ORDER — LISINOPRIL 2.5 MG PO TABS
1.2500 mg | ORAL_TABLET | Freq: Every day | ORAL | Status: DC
  Administered 2014-02-05 – 2014-02-06 (×2): 1.25 mg via ORAL
  Filled 2014-02-05 (×2): qty 0.5

## 2014-02-05 MED ORDER — POTASSIUM CHLORIDE CRYS ER 20 MEQ PO TBCR: 20.0000 meq | EXTENDED_RELEASE_TABLET | Freq: Every day | ORAL | Status: DC

## 2014-02-05 MED ORDER — POTASSIUM CHLORIDE 20 MEQ/15ML (10%) PO LIQD
40.0000 meq | Freq: Two times a day (BID) | ORAL | Status: DC
  Administered 2014-02-05: 40 meq via ORAL
  Filled 2014-02-05 (×2): qty 30

## 2014-02-05 MED ORDER — FUROSEMIDE 40 MG PO TABS
40.0000 mg | ORAL_TABLET | Freq: Every day | ORAL | Status: DC
  Administered 2014-02-05 – 2014-02-06 (×2): 40 mg via ORAL
  Filled 2014-02-05 (×2): qty 1

## 2014-02-05 NOTE — Progress Notes (Signed)
CSW (Clinical Child psychotherapist) spoke with pt son to inquire about facility choice. Pt son informed CSW that pt continues to request to go home. Pt son informed CSW that he would like to proceed with home to follow pt wishes even though pt is confused. Pt son stated that he was already providing 24 hr care and will continue to do so. Pt son mentioned that he would like assistance from home health services and requesting [facility]. CSW notified RNCM of this decision. At this time, pt has no further hospital social work needs. CSW signing off.  Dionis Autry, LCSWA [phone]

## 2014-02-05 NOTE — Progress Notes (Signed)
[facility] PROGRESS NOTE  Richard ShuttersFred M Chavez ZOX:[id number] DOB: [birth year] DOA: 01/27/2014 PCP: Thayer HeadingsMACKENZIE,BRIAN, MD   Admit HPI / Brief Narrative:  [facility] by Critical Care for CAP and acute CHF and was noted to be in atrial fibrillation with RVR.  On arrival to the ED yesterday, CXR revealed cardiomegaly and Infiltrate/ consolidation in right upper lobe highly suspicious for pneumonia. WBC was WNL on admit but now elevated at 17.6. BNP was elevated at 6726. Troponin negative x 3. He was hypokalemic with K at 3.4. Scr 1.79. EKG demonstrated Afib with ventricular response in the 130s. He was started on BiPap therapy, antibiotics and admitted to the ICU. He was placed on IV Cardizem. His home dose of Metoprolol, 100 mg BID was continued, as was Eliquis. A repeat 2D echo was obtained and reveals a notable decrease in systolic function compared to prior study with new EF at 20-25% (40-45% 09/2012). Diffuse hypokinesis was noted. Now actively receiving diuresis    Assessment/Plan: Acute respiratory failure: - Multifactorial secondary to acute on chronic CHF exacerbation, atrial fibrillation with RVR, community acquired pneumonia.  -  I/O= -580. WBC - Continue empiric Augmentin, amiodarone, Lopressor, Mucinex. Cardiology followed as well. Follow.  Right upper lobe community-acquired pneumonia: - Clinical improvement. Patient is afebrile. WBC is normalized, has remained afebrile. - Completed abx course.  COPD: Currently stable. Continue daily prednisone, albuterol nebs as needed, Spiriva, Pulmicort, flutter valve, Mucinex.  A. fib with RVR - Patient with heart rate in the 90s to low 100s.  - Continue amiodarone and Lopressor for rate control.  - Eliquis for anticoagulation. - Patient is being seen by cardiology DCCV on 10.2.2015.  Acute on chronic systolic heart failure/cardiomyopathy - 2-D echo 01/27/2014 with EF of 20-25% with diffuse hypokinesis.  - Cardiac enzymes  negative x2. I/O = -5.17173ml during this hospitalization.  - held lasix.  BPH/urinary retention/hematuria - Hematuria is improved.   Hypokalemia: - replete recheck.  Gastroesophageal reflux disease/history of Barrett's esophagus/consent for recurrent aspiration - Continue current diet as per speech therapist. Continue PPI.  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 Continue eliquis for secondary stroke prevention.  Mild dementia Neurontin on hold. Minimize sedating drugs.  History of complete heart block status post PPM Stable.  #12 prophylaxis PPI for GI prophylaxis. Eliquis for DVT prophylaxis.  Code Status: Full Family Communication: Updated patient and son at bedside. Disposition Plan: SNF when medically stable.   Consultants:  Cardiology: 01/28/2014  Urology 01/30/2014  Procedures:  2-D echo 01/27/2014  Chest x-ray 01/27/2014, 01/28/2014, 01/31/2014  Antibiotics:  Augmentin 01/30/2014  IV azithromycin 01/27/2014 >>> 01/30/2014  IV Rocephin 01/27/2014>>>>> 01/30/2014  HPI/Subjective: Patient states shortness of breath has improved.   Objective: Filed Vitals:   02/05/14 0457  BP: 108/80  Pulse: 87  Temp: 97.6 F (36.4 C)  Resp: 22    Intake/Output Summary (Last 24 hours) at 02/05/14 0818 Last data filed at 02/05/14 0232  Gross per 24 hour  Intake    120 ml  Output    700 ml  Net   -580 ml   Filed Weights   02/03/14 0636 02/04/14 0452 02/05/14 0457  Weight: 103.7 kg (228 lb 9.9 oz) 102.1 kg (225 lb 1.4 oz) 101.89 kg (224 lb 10 oz)    Exam:   General:  nad  Cardiovascular: Irregularly irregular.  Respiratory: good air movement CTA B/L  Abdomen: Soft, distended, positive bowel sounds, nontender to palpation.  Musculoskeletal: No clubbing/cyanosis. 3+ bilateral lower extremity edema. Psoriatic plaques noted.  Data Reviewed: Basic Metabolic Panel:  Recent Labs Lab 01/30/14 0432 01/31/14 0450  02/02/14 0440 02/02/14 1216 02/03/14 0915 02/04/14 0500 02/04/14 1440  NA 139 135* 136*  --  134* 139 137  K 3.5* 3.8 6.1* 3.6* 5.0 2.9* 4.3  CL 99 98 97  --  96 97 99  CO2 23 22 25   --  21 24 23   GLUCOSE 101* 93 77  --  78 87 117*  BUN 31* 28* 31*  --  35* 34* 32*  CREATININE 1.19 1.01 1.10  --  1.15 1.35 1.20  CALCIUM 8.3* 7.8* 7.5*  --  7.8* 7.9* 8.1*  MG 2.2  --  2.4  --   --   --   --    Liver Function Tests:  Recent Labs Lab 01/30/14 0432  AST 18  ALT 22  ALKPHOS 71  BILITOT 2.0*  PROT 6.3  ALBUMIN 2.8*   No results found for this basename: LIPASE, AMYLASE,  in the last 168 hours No results found for this basename: AMMONIA,  in the last 168 hours CBC:  Recent Labs Lab 01/30/14 0432 01/31/14 0450 02/03/14 0915 02/04/14 0500  WBC 10.6* 7.8 6.7 7.4  NEUTROABS 8.6*  --   --   --   HGB 13.9 13.5 14.6 14.4  HCT 42.2 41.1 43.8 43.0  MCV 87.2 84.6 86.7 85.8  PLT 154 127* 198 197   Cardiac Enzymes: No results found for this basename: CKTOTAL, CKMB, CKMBINDEX, TROPONINI,  in the last 168 hours BNP (last 3 results)  Recent Labs  04/23/13 1221 01/27/14 0929 02/03/14 0915  PROBNP 1516.00* 6726.0* 3422.0*   CBG: No results found for this basename: GLUCAP,  in the last 168 hours  Recent Results (from the past 240 hour(s))  URINE CULTURE     Status: None   Collection Time    01/27/14  9:05 AM      Result Value Ref Range Status   Specimen Description URINE, CATHETERIZED   Final   Special Requests NONE   Final   Culture  Setup Time     Final   Value: 01/27/2014 14:30     Performed at [facility] Count     Final   Value: NO GROWTH     Performed at [facility]   Culture     Final   Value: NO GROWTH     Performed at [facility]   Report Status 01/28/2014 FINAL   Final  CULTURE, BLOOD (ROUTINE X 2)     Status: None   Collection Time    01/27/14  9:20 AM      Result Value Ref Range Status   Specimen Description BLOOD  RIGHT FOREARM   Final   Special Requests BOTTLES DRAWN AEROBIC ONLY 10CC   Final   Culture  Setup Time     Final   Value: 01/27/2014 14:12     Performed at [facility]   Culture     Final   Value: NO GROWTH 5 DAYS     Performed at [facility]   Report Status 02/02/2014 FINAL   Final  CULTURE, BLOOD (ROUTINE X 2)     Status: None   Collection Time    01/27/14  9:29 AM      Result Value Ref Range Status   Specimen Description BLOOD LEFT ANTECUBITAL   Final  Special Requests BOTTLES DRAWN AEROBIC ONLY 5CC   Final   Culture  Setup Time     Final   Value: 01/27/2014 14:12     Performed at [facility]   Culture     Final   Value: NO GROWTH 5 DAYS     Performed at [facility]   Report Status 02/02/2014 FINAL   Final  MRSA PCR SCREENING     Status: None   Collection Time    01/27/14 11:05 AM      Result Value Ref Range Status   MRSA by PCR NEGATIVE  NEGATIVE Final   Comment:            The GeneXpert MRSA Assay (FDA     approved for NASAL specimens     only), is one component of a     comprehensive MRSA colonization     surveillance program. It is not     intended to diagnose MRSA     infection nor to guide or     monitor treatment for     MRSA infections.     Studies: Dg Chest 2 View  02/04/2014   CLINICAL DATA:  Cough.  Recently treated for pneumonia.  EXAM: CHEST  2 VIEW  COMPARISON:  01/31/2014.  FINDINGS: Stable enlarged cardiac silhouette, post CABG changes and right subclavian pacemaker leads. Airspace opacity in the right upper lobe with improvement. The remainder of the lungs are clear. The visualized bones are unremarkable.  IMPRESSION: Improving right upper lobe pneumonia.   Electronically Signed   By: Gordan Payment M.D.   On: 02/04/2014 14:14    Scheduled Meds: . amiodarone  400 mg Oral BID  . apixaban  5 mg Oral BID  . budesonide (PULMICORT) nebulizer solution  0.25 mg Nebulization BID  . dextromethorphan-guaiFENesin  1 tablet  Oral BID  . docusate sodium  100 mg Oral BID  . metoprolol tartrate  75 mg Oral TID  . nystatin  5 mL Oral QID  . pantoprazole  40 mg Oral Q1200  . polyethylene glycol  17 g Oral Daily  . predniSONE  5 mg Oral Q breakfast  . sodium chloride  3 mL Intravenous Q12H  . tamsulosin  0.4 mg Oral QPC supper  . tiotropium  18 mcg Inhalation Daily   Continuous Infusions: . sodium chloride 10 mL/hr at 01/28/14 0600  . sodium chloride    . sodium chloride      Principal Problem:   Acute respiratory failure Active Problems:   Hypercholesteremia   COPD (chronic obstructive pulmonary disease)   GERD (gastroesophageal reflux disease)   Pacemaker, St Jude, implanted 2008   CKD (chronic kidney disease) stage 2, GFR 60-89 ml/min   CAP (community acquired pneumonia)   Atrial fibrillation with RVR   Acute on chronic systolic HF (heart failure)   Urinary retention   Cardiomyopathy    Time spent: 40 MINS    Marinda Elk MD [facility] Pager [phone]. If 7PM-7AM, please contact night-coverage at [email or web address], password [facility] 02/05/2014, 8:18 AM  LOS: 9 days

## 2014-02-05 NOTE — Interval H&P Note (Signed)
History and Physical Interval Note:  02/05/2014 9:41 AM  Richard Chavez  has presented today for surgery, with the diagnosis of AFIB  The various methods of treatment have been discussed with the patient and family. After consideration of risks, benefits and other options for treatment, the patient has consented to  Procedure(s): CARDIOVERSION (N/A) as a surgical intervention .  The patient's history has been reviewed, patient examined, no change in status, stable for surgery.  I have reviewed the patient's chart and labs.  Questions were answered to the patient's satisfaction.     [name]

## 2014-02-05 NOTE — Progress Notes (Signed)
Speech Language Pathology Treatment: Dysphagia  Patient Details Name: Richard Chavez MRN: [id number] DOB: 1927/04/15 Today's Date: 02/05/2014 Time: 3668-1594 SLP Time Calculation (min): 13 min  Assessment / Plan / Recommendation Clinical Impression  Pt. More alert this session.  Son reports pt.'s appetite still poor.  He agreed to consume ice cream and chocolate Ensure pudding without s/s aspiration.  SLP educated pt. to stay upright minimum of 45 minutes after meals.  Pt. States "I will eat more once I get home."  Continue Dys 2 (due to propensity fatigue) for texture while in hospital and thin liquids.   HPI HPI: 80 y/o WM with PMH of right  MCA CVA, GERD, CHF, Barretts esophagus, COPD, recurrent PNA,  who presented to the [facility] via EMS with complaints of worsening SOB and congestion. Work up concerning for RUL CAP, concern for recurrent aspiration. Required BiPAP in ER.    Pertinent Vitals Pain Assessment: No/denies pain  SLP Plan  All goals met    Recommendations Diet recommendations: Dysphagia 2 (fine chop);Thin liquid Liquids provided via: Cup Medication Administration: Whole meds with puree Supervision: Patient able to self feed;Full supervision/cueing for compensatory strategies Compensations: Slow rate;Small sips/bites;Follow solids with liquid Postural Changes and/or Swallow Maneuvers: Seated upright 90 degrees;Upright 30-60 min after meal              Oral Care Recommendations: Oral care BID Follow up Recommendations: None Plan: All goals met    GO     Houston Siren 02/05/2014, 3:09 PM  Orbie Pyo Colvin Caroli.Ed [employer] [phone]

## 2014-02-05 NOTE — Progress Notes (Signed)
Patient alert and oriented x1 (to self) throughout shift; vital signs stable.  Son at bedside for most of shift.  Patient up to chair with 2 assist and walker.  Patient and son state they have no questions or concerns at this time.  Will continue to monitor.

## 2014-02-05 NOTE — Progress Notes (Signed)
Patient Name: Richard Chavez Date of Encounter: 02/05/2014     Principal Problem:   Acute respiratory failure Active Problems:   Hypercholesteremia   COPD (chronic obstructive pulmonary disease)   GERD (gastroesophageal reflux disease)   Pacemaker, [employer] Jude, implanted 2008   CKD (chronic kidney disease) stage 2, GFR 60-89 ml/min   CAP (community acquired pneumonia)   Atrial fibrillation with RVR   Acute on chronic systolic HF (heart failure)   Urinary retention   Cardiomyopathy    SUBJECTIVE  Breathing much better but just feeling overall weak. Appears lethargic.  CURRENT MEDS . amiodarone  400 mg Oral BID  . apixaban  5 mg Oral BID  . budesonide (PULMICORT) nebulizer solution  0.25 mg Nebulization BID  . dextromethorphan-guaiFENesin  1 tablet Oral BID  . docusate sodium  100 mg Oral BID  . metoprolol tartrate  75 mg Oral TID  . nystatin  5 mL Oral QID  . pantoprazole  40 mg Oral Q1200  . polyethylene glycol  17 g Oral Daily  . predniSONE  5 mg Oral Q breakfast  . sodium chloride  3 mL Intravenous Q12H  . tamsulosin  0.4 mg Oral QPC supper  . tiotropium  18 mcg Inhalation Daily    OBJECTIVE  Filed Vitals:   02/04/14 2010 02/04/14 2058 02/05/14 0457 02/05/14 0745  BP:  111/87 108/80   Pulse:  96 87   Temp:  97.6 F (36.4 C) 97.6 F (36.4 C)   TempSrc:  Oral Oral   Resp:  22 22   Height:      Weight:   224 lb 10 oz (101.89 kg)   SpO2: 99% 100% 94% 97%    Intake/Output Summary (Last 24 hours) at 02/05/14 0811 Last data filed at 02/05/14 0232  Gross per 24 hour  Intake    120 ml  Output    700 ml  Net   -580 ml   Filed Weights   02/03/14 0636 02/04/14 0452 02/05/14 0457  Weight: 228 lb 9.9 oz (103.7 kg) 225 lb 1.4 oz (102.1 kg) 224 lb 10 oz (101.89 kg)    PHYSICAL EXAM  General: A./O. x2, NAD, improvement in breathing Lungs: decreased BS at bases, fine crackles  Cardiovascular: Irregular irregular rhythm and rate without murmur gallop or rub normal  S1 and S2  Abdomen: Nontender, nondistended, soft, bowel sounds positive, no rebound, no ascites, no appreciable mass  Extremities: No significant cyanosis, clubbing/ 2 +  edema bilateral lower extremities  Neurologic; cranial nerves 2-12 intact, pupils equal round reactive to light and accommodation, patient moves all extremities to command, sensation intact   Accessory Clinical Findings  CBC  Recent Labs  02/03/14 0915 02/04/14 0500  WBC 6.7 7.4  HGB 14.6 14.4  HCT 43.8 43.0  MCV 86.7 85.8  PLT 198 197   Basic Metabolic Panel  Recent Labs  02/04/14 0500 02/04/14 1440  NA 139 137  K 2.9* 4.3  CL 97 99  CO2 24 23  GLUCOSE 87 117*  BUN 34* 32*  CREATININE 1.35 1.20  CALCIUM 7.9* 8.1*    TELE  afib with rate control.   Radiology/Studies  Dg Chest 2 View  01/31/2014   CLINICAL DATA:  Shortness of breath and vascular congestion, followup, history hypertension, coronary artery disease post MI, chronic renal failure, COPD, GERD, ischemic cardiomyopathy  EXAM: CHEST  2 VIEW  COMPARISON:  01/28/2014  FINDINGS: RIGHT subclavian sequential pacemaker leads project at RIGHT atrium and RIGHT  ventricle.  Enlargement of cardiac silhouette post CABG.  Atherosclerotic calcification aortic arch.  Stable mediastinal contours.  Persistent consolidation RIGHT upper lobe.  Pulmonary vascular congestion accentuation of pulmonary markings throughout remainder of lungs appear stable.  No additional infiltrate, pleural effusion or pneumothorax.  IMPRESSION: Persistent RIGHT upper lobe consolidation consistent with pneumonia.  Enlargement of cardiac silhouette post pacemaker and CABG.   Electronically Signed   By: Ulyses Southward M.D.   On: 01/31/2014 08:27   Dg Chest Port 1 View  01/28/2014   CLINICAL DATA:  Evaluate pneumonia  EXAM: PORTABLE CHEST - 1 VIEW  COMPARISON:  01/27/2014  FINDINGS: Persistent right upper lobe pneumonia is identified although mild increase in aeration in the right upper  lobe is noted. The lungs are otherwise within normal limits. Postsurgical changes are seen. Cardiac shadow is stable. No acute bony abnormality is noted. A pacing device is again seen.  IMPRESSION: Persistent but mildly improved right upper lobe pneumonia.   Electronically Signed   By: Alcide Clever M.D.   On: 01/28/2014 07:05   Dg Chest Portable 1 View  01/27/2014   CLINICAL DATA:  History of CVA  EXAM: PORTABLE CHEST - 1 VIEW  COMPARISON:  04/01/2013  FINDINGS: Cardiomegaly is noted. Dual lead cardiac pacemaker is unchanged in position. Status post median sternotomy. There is infiltrate/ consolidation in right upper lobe highly suspicious for pneumonia. Follow-up to resolution is recommended. No pulmonary edema.  IMPRESSION: Cardiomegaly. Dual lead cardiac pacemaker in place. Infiltrate/ consolidation in right upper lobe highly suspicious for pneumonia. Follow-up to resolution is recommended.   Electronically Signed   By: Natasha Mead M.D.   On: 01/27/2014 08:59    ASSESSMENT AND PLAN  Richard Chavez is a 80 y.o. male with a history of A-Fib, CAD s/p CABG, chronic systolic CHF, HTN, CKD, and CHB s/p PPM who was admitted on 01/27/14 with dyspnea and was diagnosed with pneumonia. Pacer was interrogated and he was found to have AFib for the past 79 days - did not appear to be related to the acute illness.   Persistent atrial fibrillation, onset over 2 months ago by pacer interrogation. Currently rate controlled. -- Continue Eliquis 5mg  BID, Lopressor 75mg  TID, amiodarone 400mg  BID. Amiodarone was started on 01/29/14, initially intravenously, and subsequently 400 mg daily until the present time. Goal was to maximize amiodarone loading and optimize heart failure prior to DCCV. -- Plan for DCCV today. No need for TEE as he has been on long term anticoagulation with Eliquis.  Cardiomyopathy/CHF, LVEF 20-25% by most recent echocardiogram, diffuse hypokinesis. Represents decrease compared to prior testing, question  tachycardia mediated. -- Consider outpatient ischemic testing once more stable.  -- He diuresed well with IV Lasix. Net neg 6.6L. Weight 243lbs--> 224 lbs today. There was some suspicion for over-diuresis yesterday as he was feeling weak, he was slightly hypotensive (SBP 95) and his creat was mildly elevated, so Lasix was discontinued yesterday. Consider adding back matinence dose diuretics today. He was on home Lasix 60mg  PO BID. -- Suspect chronic LE edema as it has been documented as a problem in the past when he was euvolemic. TED hose to be placed.   Acute respiratory failure- Now resolved. Initially requring BiPap and admitted to the ICU -- Likely multifactorial secondary to acute on chronic CHF exacerbation, atrial fibrillation with RVR, community acquired pneumonia. -- Continue Abx per IM.  CAD status post previous CABG. Had patent grafts in 2011.  No active angina.  -- Troponin  neg  History of complete heart block, status post [employer] Jude pacemaker.    Hypokaemia- resolved after supplementation. 2.9-->4.3. Continue to monitor  Chronic renal insufficiency- GFR ~70 at baseline.  Kidney function slightly worsening with diuresis. Creat 1.20 and GFR 62, which is improved from yesterday -- Lasix discontinued yesterday as he appeared to be getting too dry.    Byrd HesselbachSigned, THOMPSON, KATHRYN R PA-C  Pager [phone]   I have seen and examined the patient along with Allena KatzHOMPSON, KATHRYN R PA-C, PA NP.  I have reviewed the chart, notes and new data.  I agree with PA's note.  PLAN: Persistent atrial fibrillation (versus atrial flutter?) that may have contributed to depressed LVEF in an elderly patient with chronic mild ischemic cardiomyopathy, now with markedly depressed LVEF in the setting of lobar pneumonia and persistent atrial tachyarrhythmia. He has a dual chamber pacemaker.  Scheduled for DC cardioversion today. May try overdrive pacing via pacemaker first if the arrhythmia appears  organized. This procedure has been fully reviewed with the patient and written informed consent has been obtained.  Thurmon FairMihai Geovana Gebel, MD, [facility] AthensFACC [facility] [phone] 02/05/2014, 10:29 AM

## 2014-02-05 NOTE — Plan of Care (Signed)
Problem: Phase I Progression Outcomes Goal: Up in chair, BRP Outcome: Completed/Met Date Met:  02/05/14 Up in chair with 2 person assist

## 2014-02-05 NOTE — Op Note (Signed)
Noninvasive EPS note  Re: atrial flutter with possible tachycardia mediated cardiomyopathy  Patient referred for DC cardioversion for persistent atrial fibrillation. He has been on uninterrupted anticoagulation for >4 weeks. On arrival, pacemaker interrogation showed an atrial electrogram consistent with atrial flutter with a CL of approximately 290 ms. Pace termination was performed via the device, using atrial burst pacing at gradually decreasing cycle lengths.  Eventually successful using burst pacing at 180 ms CL. After the procedure, rhythm was atrial paced, ventricular sensed. Continue uninterrupted anticoagulation.  Thurmon Fair, MD, [facility] [phone] office [phone] pager

## 2014-02-05 NOTE — Plan of Care (Signed)
Problem: Phase I Progression Outcomes Goal: Hemodynamically stable Outcome: Progressing Remains atrial fib.  NPO at present pending cardioversion.  VSS.  Will continue to monitor patient condition.

## 2014-02-05 NOTE — Progress Notes (Signed)
Patient in endoscopy today for a cardioversion.  [employer] representative in room with Dr. Royann Shivers and able to pace patient out of atrial fibirilation.  Procedure cancelled.

## 2014-02-05 NOTE — Progress Notes (Addendum)
       Patient Name: Richard Chavez Date of Encounter: 02/05/2014    SUBJECTIVE: The patient seems confused. He is upset with his son that conversations concerning skilled nursing facility have occurred. He refuses to consider any option other than returning home.  Atrial flutter was converted by overdrive tachycardia pacing using his internal device. He did not require electrical shock.  He is lying flat in bed and denies dyspnea.  TELEMETRY:  Sinus rhythm and AV sequential pacing intermittent Filed Vitals:   02/05/14 0852 02/05/14 0922 02/05/14 1131 02/05/14 1315  BP:  134/79 129/67 141/74  Pulse:  85 79 79  Temp:  97.6 F (36.4 C)  97.7 F (36.5 C)  TempSrc:  Oral  Oral  Resp:  19 18 20   Height:      Weight:      SpO2: 98% 95% 97% 97%    Intake/Output Summary (Last 24 hours) at 02/05/14 1510 Last data filed at 02/05/14 1153  Gross per 24 hour  Intake    120 ml  Output    950 ml  Net   -830 ml   LABS: Basic Metabolic Panel:  Recent Labs  61/68/37 1440 02/05/14 1220  NA 137 137  K 4.3 3.3*  CL 99 100  CO2 23 23  GLUCOSE 117* 94  BUN 32* 31*  CREATININE 1.20 1.26  CALCIUM 8.1* 8.3*   CBC:  Recent Labs  02/03/14 0915 02/04/14 0500  WBC 6.7 7.4  HGB 14.6 14.4  HCT 43.8 43.0  MCV 86.7 85.8  PLT 198 197   I/O: Cumulative -6.9 L since admission  Radiology/Studies:  Not performed  Physical Exam: Blood pressure 141/74, pulse 79, temperature 97.7 F (36.5 C), temperature source Oral, resp. rate 20, height 6\' 2"  (1.88 m), weight 224 lb 10 oz (101.89 kg), SpO2 97.00%. Weight change: -7.4 oz (-0.21 kg)  Wt Readings from Last 3 Encounters:  02/05/14 224 lb 10 oz (101.89 kg)  02/05/14 224 lb 10 oz (101.89 kg)  10/28/13 249 lb 12.8 oz (113.309 kg)   Lying flat in bed Chest is clear to auscultation and percussion anteriorly Cardiac exam reveals an irregular rhythm but not irregularly irregular. There is no peripheral edema Somewhat  confused  ASSESSMENT:  1. Acute on chronic systolic heart failure clinically improved after a near 7 L diuresis 2. Atrial flutter reverted to normal sinus rhythm/atrial pacing with overdrive pacing earlier today 3. Amiodarone therapy, recently instituted 4. Chronic kidney disease 5. COPD  Plan:  1. Initiate oral diuretic therapy him a furosemide 40 mg daily to start in a.m. 2. Simplify the medical regimen now that he is back in sinus rhythm. He was only on 25 mg twice a day of metoprolol prior to atrial flutter. His current dose should probably be continued given systolic dysfunction 3. Eligible for discharge once he is on an oral medication regimen that is stable. He could be discharged as early as tomorrow morning,  if strong enough and provisions have been made for him to return home. 4. We need to add ACE/ARB, lisinopril 1.25 mg today and ? 2.5 mg starting tomorrow depending on renal function.  Selinda Eon 02/05/2014, 3:10 PM

## 2014-02-06 DIAGNOSIS — I484 Atypical atrial flutter: Secondary | ICD-10-CM

## 2014-02-06 DIAGNOSIS — I255 Ischemic cardiomyopathy: Secondary | ICD-10-CM

## 2014-02-06 DIAGNOSIS — R339 Retention of urine, unspecified: Secondary | ICD-10-CM

## 2014-02-06 MED ORDER — POTASSIUM CHLORIDE 20 MEQ/15ML (10%) PO LIQD: 20.0000 meq | Freq: Every day | ORAL | Status: AC

## 2014-02-06 MED ORDER — FUROSEMIDE 40 MG PO TABS: 40.0000 mg | ORAL_TABLET | Freq: Every day | ORAL | Status: AC

## 2014-02-06 MED ORDER — LISINOPRIL 2.5 MG PO TABS: 1.2500 mg | ORAL_TABLET | Freq: Every day | ORAL | Status: AC

## 2014-02-06 MED ORDER — POTASSIUM CHLORIDE CRYS ER 20 MEQ PO TBCR: 40.0000 meq | EXTENDED_RELEASE_TABLET | Freq: Two times a day (BID) | ORAL | Status: DC

## 2014-02-06 MED ORDER — AMIODARONE HCL 400 MG PO TABS: 400.0000 mg | ORAL_TABLET | Freq: Every day | ORAL | Status: AC

## 2014-02-06 NOTE — Progress Notes (Addendum)
       Patient Name: Richard Chavez Date of Encounter: 02/06/2014    SUBJECTIVE: Feeling better. Overdrive pacing yesterday. Potassium is low today, now on oral lasix - creatinine is stabilized. BNP decreased from 6726 to 3422.  TELEMETRY:  Sinus rhythm and AV sequential pacing intermittent Filed Vitals:   02/05/14 1941 02/05/14 2017 02/06/14 0514 02/06/14 0824  BP:  142/73 106/89   Pulse:  78 75   Temp:  97.9 F (36.6 C) 97.6 F (36.4 C)   TempSrc:  Oral Oral   Resp:  18 18   Height:      Weight:   222 lb 3.6 oz (100.8 kg)   SpO2: 98% 98% 98% 98%    Intake/Output Summary (Last 24 hours) at 02/06/14 0834 Last data filed at 02/05/14 2141  Gross per 24 hour  Intake    600 ml  Output    600 ml  Net      0 ml   LABS: Basic Metabolic Panel:  Recent Labs  09/81/19 1440 02/05/14 1220  NA 137 137  K 4.3 3.3*  CL 99 100  CO2 23 23  GLUCOSE 117* 94  BUN 32* 31*  CREATININE 1.20 1.26  CALCIUM 8.1* 8.3*   CBC:  Recent Labs  02/03/14 0915 02/04/14 0500  WBC 6.7 7.4  HGB 14.6 14.4  HCT 43.8 43.0  MCV 86.7 85.8  PLT 198 197   I/O: Cumulative -6.9 L since admission  Radiology/Studies:  Not performed  Physical Exam: Blood pressure 106/89, pulse 75, temperature 97.6 F (36.4 C), temperature source Oral, resp. rate 18, height 6\' 2"  (1.88 m), weight 222 lb 3.6 oz (100.8 kg), SpO2 98.00%. Weight change: -2 lb 6.5 oz (-1.09 kg)  Wt Readings from Last 3 Encounters:  02/06/14 222 lb 3.6 oz (100.8 kg)  02/06/14 222 lb 3.6 oz (100.8 kg)  10/28/13 249 lb 12.8 oz (113.309 kg)   Lying flat in bed Chest is clear to auscultation and percussion anteriorly Cardiac exam reveals an irregular rhythm but not irregularly irregular. There is no peripheral edema Somewhat confused  ASSESSMENT:  1. Acute on chronic systolic heart failure clinically improved after a near 7 L diuresis 2. Atrial flutter reverted to normal sinus rhythm/atrial pacing with overdrive pacing earlier  today 3. Amiodarone therapy, recently instituted 4. Chronic kidney disease 5. COPD  Plan:  1. Stable on oral diuretic - continue foley per urology. 2. Continue current metoprolol dose. 3. Would not push the dose of lisinopril up further until renal function has stabilized. 4. [facility] arranged - okay for d/c home today from my perspective. 5. Will need follow-up appointment in 5-7 days at [facility] with MLP and follow-up with Dr. Wayne Sever, MD, [facility] Attending Cardiologist [facility]    Jaretssi Kraker C 02/06/2014, 8:34 AM

## 2014-02-06 NOTE — Discharge Summary (Addendum)
Physician Discharge Summary  Richard Chavez ZOX:[id number] DOB: 1926-10-19 DOA: 01/27/2014  PCP: Thayer Headings, MD  Admit date: 01/27/2014 Discharge date: 02/06/2014  Time spent: 35 minutes  Recommendations for Outpatient Follow-up:  1. Follow up with Cardiology in 1 week. 2. Home with PT and home health  Discharge Diagnoses:  Principal Problem:   Acute respiratory failure Active Problems:   Hypercholesteremia   COPD (chronic obstructive pulmonary disease)   GERD (gastroesophageal reflux disease)   Pacemaker, St Jude, implanted 2008   CKD (chronic kidney disease) stage 2, GFR 60-89 ml/min   CAP (community acquired pneumonia)   Atrial fibrillation with RVR   Acute on chronic systolic HF (heart failure)   Urinary retention   Cardiomyopathy   Atrial flutter   Discharge Condition: stable  Diet recommendation: low sodium fluid restricted diet  Filed Weights   02/04/14 0452 02/05/14 0457 02/06/14 0514  Weight: 102.1 kg (225 lb 1.4 oz) 101.89 kg (224 lb 10 oz) 100.8 kg (222 lb 3.6 oz)    History of present illness:  80 y/o WM with PMH of CVA, GERD, CHF who presented to the [facility] via EMS with complaints of worsening SOB and congestion. Work up concerning for RML CAP   Hospital Course:  Acute respiratory failure: - Multifactorial secondary to acute on chronic CHF exacerbation, atrial fibrillation with RVR, community acquired pneumonia.  - Resolved. - refused SNF, as he will be with Son.  - Continue empiric Augmentin, IV Lasix, amiodarone, Lopressor, Mucinex. Cardiology followed as well. Follow.  Right upper lobe community-acquired pneumonia:  - started empirically on abx. - Completed abx course in house. - Clinical improvement. Patient is afebrile. WBC is normalized, has remained afebrile.   COPD  Currently stable. Continue daily prednisone, albuterol nebs as needed, Spiriva, Pulmicort, flutter valve, Mucinex.   A. fib with RVR  - Started on IV amiodarone, and cont on  lopressor.  - Patient is being seen by cardiology,  referred for DC cardioversion for persistent atrial fibrillation, pacemaker interrogation showed an atrial electrogram consistent with atrial flutter, Pace termination was performed via the device, using atrial burst pacing at gradually decreasing cycle lengths, After the procedure, rhythm was atrial paced, ventricular sensed. - Change to amiodarone and Lopressor for rate control. D/c verapamil. - Eliquis for anticoagulation, before admission, cont as an outpatient.   Acute on chronic systolic heart failure/cardiomyopathy  - 2-D echo 01/27/2014 with EF of 20-25% with diffuse hypokinesis.  - Cardiac enzymes negative x2. I/O = -5.169ml during this hospitalization.  - held lasix. Resume as an outpatient.  BPH/urinary retention/hematuria  - Hematuria is improved.  - consulted urology placed foley started flomax.  Hypokalemia:  - replete recheck.   Gastroesophageal reflux disease/history of Barrett's esophagus/consent for recurrent aspiration  - Continue current diet as per speech therapist. Continue PPI.   History of a CVA with residual weakness to the upper extremity  - At baseline. Continue eliquis for secondary stroke prevention   Procedures:  Noninvasive EPS.  Consultations:  cardiology  Discharge Exam: Filed Vitals:   02/06/14 0514  BP: 106/89  Pulse: 75  Temp: 97.6 F (36.4 C)  Resp: 18    General: A&O x3 Cardiovascular: RRR Respiratory: good air movement CTA B/L  Discharge Instructions You were cared for by a hospitalist during your hospital stay. If you have any questions about your discharge medications or the care you received while you were in the hospital after you are discharged, you can call the unit and asked to speak  with the hospitalist on call if the hospitalist that took care of you is not available. Once you are discharged, your primary care physician will handle any further medical issues. Please note  that NO REFILLS for any discharge medications will be authorized once you are discharged, as it is imperative that you return to your primary care physician (or establish a relationship with a primary care physician if you do not have one) for your aftercare needs so that they can reassess your need for medications and monitor your lab values.  Discharge Instructions   Diet - low sodium heart healthy    Complete by:  As directed      Increase activity slowly    Complete by:  As directed           Current Discharge Medication List    START taking these medications   Details  amiodarone (PACERONE) 400 MG tablet Take 1 tablet (400 mg total) by mouth daily. Qty: 30 tablet, Refills: 0    lisinopril (PRINIVIL,ZESTRIL) 2.5 MG tablet Take 0.5 tablets (1.25 mg total) by mouth daily. Qty: 30 tablet, Refills: 0    potassium chloride 20 MEQ/15ML (10%) solution Take 15 mLs (20 mEq total) by mouth daily. Qty: 500 mL, Refills: 0      CONTINUE these medications which have CHANGED   Details  furosemide (LASIX) 40 MG tablet Take 1 tablet (40 mg total) by mouth daily. Qty: 30 tablet, Refills: 0      CONTINUE these medications which have NOT CHANGED   Details  albuterol-ipratropium (COMBIVENT) 18-103 MCG/ACT inhaler Inhale 2 puffs into the lungs 2 (two) times daily.     apixaban (ELIQUIS) 5 MG TABS tablet Take 1 tablet (5 mg total) by mouth 2 (two) times daily. Qty: 60 tablet, Refills: 11    atorvastatin (LIPITOR) 20 MG tablet Take 20 mg by mouth daily with breakfast.     fluticasone (FLONASE) 50 MCG/ACT nasal spray Place 1 spray into both nostrils 2 (two) times daily as needed for allergies or rhinitis.    Fluticasone-Salmeterol (ADVAIR DISKUS) 500-50 MCG/DOSE AEPB Inhale 1 puff into the lungs every 12 (twelve) hours.     gabapentin (NEURONTIN) 300 MG capsule Take 600 mg by mouth 3 (three) times daily.     guaiFENesin (MUCINEX) 600 MG 12 hr tablet Take 1,200 mg by mouth 2 (two) times daily.     loratadine (CLARITIN) 10 MG tablet Take 10 mg by mouth daily with breakfast.     Magnesium Chloride (SLOW-MAG) 535 (64 MG) MG TBCR Take 1 tablet by mouth 2 (two) times daily.     metoprolol (LOPRESSOR) 100 MG tablet Take 100 mg by mouth 2 (two) times daily.    Omega-3 Fatty Acids (FISH OIL) 1000 MG CAPS Take 1,000 mg by mouth daily.     potassium chloride (K-DUR,KLOR-CON) 10 MEQ tablet Take 10 mEq by mouth 2 (two) times daily.    predniSONE (DELTASONE) 5 MG tablet Take 5 mg by mouth daily.    ranitidine (ZANTAC) 150 MG capsule Take 150 mg by mouth 2 (two) times daily.     tiotropium (SPIRIVA) 18 MCG inhalation capsule Place 18 mcg into inhaler and inhale daily with breakfast.     Vitamin D, Ergocalciferol, (DRISDOL) 50000 UNITS CAPS Take 50,000 Units by mouth every 7 (seven) days. Take on Fridays    vitamin E 1000 UNIT capsule Take 1,000 Units by mouth daily with breakfast.       STOP taking these medications  verapamil (CALAN-SR) 180 MG CR tablet        No Known Allergies Follow-up Information   Follow up with [facility]. (Registered Nurse and Physical Therapy Services to start within 24-48 hours of discharge)    Contact information:   [address] [place] [id number] [phone]       Follow up with Thurmon FairROITORU,MIHAI, MD In 1 week. (hospital follow up)    Specialty:  Cardiology   Contact information:   [address] [place] [address] [phone]        The results of significant diagnostics from this hospitalization (including imaging, microbiology, ancillary and laboratory) are listed below for reference.    Significant Diagnostic Studies: Dg Chest 2 View  02/04/2014   CLINICAL DATA:  Cough.  Recently treated for pneumonia.  EXAM: CHEST  2 VIEW  COMPARISON:  01/31/2014.  FINDINGS: Stable enlarged cardiac silhouette, post CABG changes and right subclavian pacemaker leads. Airspace opacity in the right upper  lobe with improvement. The remainder of the lungs are clear. The visualized bones are unremarkable.  IMPRESSION: Improving right upper lobe pneumonia.   Electronically Signed   By: Gordan PaymentSteve  Reid M.D.   On: 02/04/2014 14:14   Dg Chest 2 View  01/31/2014   CLINICAL DATA:  Shortness of breath and vascular congestion, followup, history hypertension, coronary artery disease post MI, chronic renal failure, COPD, GERD, ischemic cardiomyopathy  EXAM: CHEST  2 VIEW  COMPARISON:  01/28/2014  FINDINGS: RIGHT subclavian sequential pacemaker leads project at RIGHT atrium and RIGHT ventricle.  Enlargement of cardiac silhouette post CABG.  Atherosclerotic calcification aortic arch.  Stable mediastinal contours.  Persistent consolidation RIGHT upper lobe.  Pulmonary vascular congestion accentuation of pulmonary markings throughout remainder of lungs appear stable.  No additional infiltrate, pleural effusion or pneumothorax.  IMPRESSION: Persistent RIGHT upper lobe consolidation consistent with pneumonia.  Enlargement of cardiac silhouette post pacemaker and CABG.   Electronically Signed   By: Ulyses SouthwardMark  Boles M.D.   On: 01/31/2014 08:27   Dg Chest Port 1 View  01/28/2014   CLINICAL DATA:  Evaluate pneumonia  EXAM: PORTABLE CHEST - 1 VIEW  COMPARISON:  01/27/2014  FINDINGS: Persistent right upper lobe pneumonia is identified although mild increase in aeration in the right upper lobe is noted. The lungs are otherwise within normal limits. Postsurgical changes are seen. Cardiac shadow is stable. No acute bony abnormality is noted. A pacing device is again seen.  IMPRESSION: Persistent but mildly improved right upper lobe pneumonia.   Electronically Signed   By: Alcide CleverMark  Lukens M.D.   On: 01/28/2014 07:05   Dg Chest Portable 1 View  01/27/2014   CLINICAL DATA:  History of CVA  EXAM: PORTABLE CHEST - 1 VIEW  COMPARISON:  04/01/2013  FINDINGS: Cardiomegaly is noted. Dual lead cardiac pacemaker is unchanged in position. Status post median  sternotomy. There is infiltrate/ consolidation in right upper lobe highly suspicious for pneumonia. Follow-up to resolution is recommended. No pulmonary edema.  IMPRESSION: Cardiomegaly. Dual lead cardiac pacemaker in place. Infiltrate/ consolidation in right upper lobe highly suspicious for pneumonia. Follow-up to resolution is recommended.   Electronically Signed   By: Natasha MeadLiviu  Pop M.D.   On: 01/27/2014 08:59    Microbiology: Recent Results (from the past 240 hour(s))  MRSA PCR SCREENING     Status: None   Collection Time    01/27/14 11:05 AM      Result Value Ref Range Status   MRSA by  PCR NEGATIVE  NEGATIVE Final   Comment:            The GeneXpert MRSA Assay (FDA     approved for NASAL specimens     only), is one component of a     comprehensive MRSA colonization     surveillance program. It is not     intended to diagnose MRSA     infection nor to guide or     monitor treatment for     MRSA infections.     Labs: Basic Metabolic Panel:  Recent Labs Lab 02/02/14 0440 02/02/14 1216 02/03/14 0915 02/04/14 0500 02/04/14 1440 02/05/14 1220  NA 136*  --  134* 139 137 137  K 6.1* 3.6* 5.0 2.9* 4.3 3.3*  CL 97  --  96 97 99 100  CO2 25  --  21 24 23 23   GLUCOSE 77  --  78 87 117* 94  BUN 31*  --  35* 34* 32* 31*  CREATININE 1.10  --  1.15 1.35 1.20 1.26  CALCIUM 7.5*  --  7.8* 7.9* 8.1* 8.3*  MG 2.4  --   --   --   --   --    Liver Function Tests: No results found for this basename: AST, ALT, ALKPHOS, BILITOT, PROT, ALBUMIN,  in the last 168 hours No results found for this basename: LIPASE, AMYLASE,  in the last 168 hours No results found for this basename: AMMONIA,  in the last 168 hours CBC:  Recent Labs Lab 01/31/14 0450 02/03/14 0915 02/04/14 0500  WBC 7.8 6.7 7.4  HGB 13.5 14.6 14.4  HCT 41.1 43.8 43.0  MCV 84.6 86.7 85.8  PLT 127* 198 197   Cardiac Enzymes: No results found for this basename: CKTOTAL, CKMB, CKMBINDEX, TROPONINI,  in the last 168  hours BNP: BNP (last 3 results)  Recent Labs  04/23/13 1221 01/27/14 0929 02/03/14 0915  PROBNP 1516.00* 6726.0* 3422.0*   CBG: No results found for this basename: GLUCAP,  in the last 168 hours     Signed:  Marinda Elk  [facility] 02/06/2014, 9:48 AM

## 2014-02-20 ENCOUNTER — Emergency Department (HOSPITAL_COMMUNITY): Payer: Medicare Other

## 2014-02-20 ENCOUNTER — Inpatient Hospital Stay (HOSPITAL_COMMUNITY)
Admission: EM | Admit: 2014-02-20 | Discharge: 2014-02-26 | DRG: 871 | Disposition: A | Discharge status: Home Health | Payer: Medicare Other | Attending: Internal Medicine | Admitting: Internal Medicine

## 2014-02-20 ENCOUNTER — Encounter (HOSPITAL_COMMUNITY): Payer: Self-pay | Admitting: Emergency Medicine

## 2014-02-20 DIAGNOSIS — I129 Hypertensive chronic kidney disease with stage 1 through stage 4 chronic kidney disease, or unspecified chronic kidney disease: Secondary | ICD-10-CM | POA: Diagnosis present

## 2014-02-20 DIAGNOSIS — N182 Chronic kidney disease, stage 2 (mild): Secondary | ICD-10-CM

## 2014-02-20 DIAGNOSIS — N183 Chronic kidney disease, stage 3 unspecified: Secondary | ICD-10-CM

## 2014-02-20 DIAGNOSIS — R4182 Altered mental status, unspecified: Secondary | ICD-10-CM | POA: Diagnosis present

## 2014-02-20 DIAGNOSIS — R339 Retention of urine, unspecified: Secondary | ICD-10-CM

## 2014-02-20 DIAGNOSIS — Z806 Family history of leukemia: Secondary | ICD-10-CM | POA: Diagnosis not present

## 2014-02-20 DIAGNOSIS — Y95 Nosocomial condition: Secondary | ICD-10-CM | POA: Diagnosis present

## 2014-02-20 DIAGNOSIS — I10 Essential (primary) hypertension: Secondary | ICD-10-CM

## 2014-02-20 DIAGNOSIS — I959 Hypotension, unspecified: Secondary | ICD-10-CM | POA: Diagnosis present

## 2014-02-20 DIAGNOSIS — Z820 Family history of epilepsy and other diseases of the nervous system: Secondary | ICD-10-CM

## 2014-02-20 DIAGNOSIS — J440 Chronic obstructive pulmonary disease with acute lower respiratory infection: Secondary | ICD-10-CM | POA: Diagnosis present

## 2014-02-20 DIAGNOSIS — E871 Hypo-osmolality and hyponatremia: Secondary | ICD-10-CM | POA: Diagnosis present

## 2014-02-20 DIAGNOSIS — I739 Peripheral vascular disease, unspecified: Secondary | ICD-10-CM | POA: Diagnosis present

## 2014-02-20 DIAGNOSIS — N390 Urinary tract infection, site not specified: Secondary | ICD-10-CM | POA: Diagnosis present

## 2014-02-20 DIAGNOSIS — I5023 Acute on chronic systolic (congestive) heart failure: Secondary | ICD-10-CM

## 2014-02-20 DIAGNOSIS — N400 Enlarged prostate without lower urinary tract symptoms: Secondary | ICD-10-CM | POA: Diagnosis present

## 2014-02-20 DIAGNOSIS — Z7901 Long term (current) use of anticoagulants: Secondary | ICD-10-CM | POA: Diagnosis not present

## 2014-02-20 DIAGNOSIS — A419 Sepsis, unspecified organism: Principal | ICD-10-CM | POA: Diagnosis present

## 2014-02-20 DIAGNOSIS — Z8673 Personal history of transient ischemic attack (TIA), and cerebral infarction without residual deficits: Secondary | ICD-10-CM | POA: Diagnosis not present

## 2014-02-20 DIAGNOSIS — B952 Enterococcus as the cause of diseases classified elsewhere: Secondary | ICD-10-CM | POA: Diagnosis present

## 2014-02-20 DIAGNOSIS — Z950 Presence of cardiac pacemaker: Secondary | ICD-10-CM | POA: Diagnosis not present

## 2014-02-20 DIAGNOSIS — I5042 Chronic combined systolic (congestive) and diastolic (congestive) heart failure: Secondary | ICD-10-CM | POA: Diagnosis present

## 2014-02-20 DIAGNOSIS — K219 Gastro-esophageal reflux disease without esophagitis: Secondary | ICD-10-CM | POA: Diagnosis present

## 2014-02-20 DIAGNOSIS — I9589 Other hypotension: Secondary | ICD-10-CM

## 2014-02-20 DIAGNOSIS — J189 Pneumonia, unspecified organism: Secondary | ICD-10-CM | POA: Diagnosis present

## 2014-02-20 DIAGNOSIS — I480 Paroxysmal atrial fibrillation: Secondary | ICD-10-CM | POA: Diagnosis present

## 2014-02-20 DIAGNOSIS — Z801 Family history of malignant neoplasm of trachea, bronchus and lung: Secondary | ICD-10-CM

## 2014-02-20 DIAGNOSIS — Z23 Encounter for immunization: Secondary | ICD-10-CM

## 2014-02-20 DIAGNOSIS — F0390 Unspecified dementia without behavioral disturbance: Secondary | ICD-10-CM | POA: Diagnosis present

## 2014-02-20 DIAGNOSIS — I2510 Atherosclerotic heart disease of native coronary artery without angina pectoris: Secondary | ICD-10-CM | POA: Diagnosis present

## 2014-02-20 DIAGNOSIS — Z8249 Family history of ischemic heart disease and other diseases of the circulatory system: Secondary | ICD-10-CM

## 2014-02-20 DIAGNOSIS — E861 Hypovolemia: Secondary | ICD-10-CM | POA: Diagnosis present

## 2014-02-20 DIAGNOSIS — Z87891 Personal history of nicotine dependence: Secondary | ICD-10-CM | POA: Diagnosis not present

## 2014-02-20 DIAGNOSIS — G4730 Sleep apnea, unspecified: Secondary | ICD-10-CM | POA: Diagnosis present

## 2014-02-20 DIAGNOSIS — J438 Other emphysema: Secondary | ICD-10-CM

## 2014-02-20 DIAGNOSIS — E785 Hyperlipidemia, unspecified: Secondary | ICD-10-CM | POA: Diagnosis present

## 2014-02-20 DIAGNOSIS — I255 Ischemic cardiomyopathy: Secondary | ICD-10-CM | POA: Diagnosis present

## 2014-02-20 DIAGNOSIS — E860 Dehydration: Secondary | ICD-10-CM | POA: Diagnosis present

## 2014-02-20 DIAGNOSIS — Z951 Presence of aortocoronary bypass graft: Secondary | ICD-10-CM

## 2014-02-20 DIAGNOSIS — I5022 Chronic systolic (congestive) heart failure: Secondary | ICD-10-CM | POA: Insufficient documentation

## 2014-02-20 DIAGNOSIS — I252 Old myocardial infarction: Secondary | ICD-10-CM | POA: Diagnosis not present

## 2014-02-20 HISTORY — DX: Cerebral infarction, unspecified: I63.9

## 2014-02-20 LAB — URINE MICROSCOPIC-ADD ON

## 2014-02-20 LAB — URINALYSIS, ROUTINE W REFLEX MICROSCOPIC
Bilirubin Urine: NEGATIVE
Glucose, UA: NEGATIVE mg/dL
KETONES UR: NEGATIVE mg/dL
Leukocytes, UA: NEGATIVE
NITRITE: NEGATIVE
Protein, ur: NEGATIVE mg/dL
SPECIFIC GRAVITY, URINE: 1.006 (ref 1.005–1.030)
UROBILINOGEN UA: 0.2 mg/dL (ref 0.0–1.0)
pH: 5 (ref 5.0–8.0)

## 2014-02-20 LAB — CBC WITH DIFFERENTIAL/PLATELET
BASOS ABS: 0 10*3/uL (ref 0.0–0.1)
BASOS PCT: 0 % (ref 0–1)
EOS PCT: 0 % (ref 0–5)
Eosinophils Absolute: 0 10*3/uL (ref 0.0–0.7)
HEMATOCRIT: 42.1 % (ref 39.0–52.0)
HEMOGLOBIN: 14 g/dL (ref 13.0–17.0)
Lymphocytes Relative: 12 % (ref 12–46)
Lymphs Abs: 1.4 10*3/uL (ref 0.7–4.0)
MCH: 28.3 pg (ref 26.0–34.0)
MCHC: 33.3 g/dL (ref 30.0–36.0)
MCV: 85.2 fL (ref 78.0–100.0)
MONO ABS: 1 10*3/uL (ref 0.1–1.0)
MONOS PCT: 8 % (ref 3–12)
Neutro Abs: 9.9 10*3/uL — ABNORMAL HIGH (ref 1.7–7.7)
Neutrophils Relative %: 80 % — ABNORMAL HIGH (ref 43–77)
Platelets: 194 10*3/uL (ref 150–400)
RBC: 4.94 MIL/uL (ref 4.22–5.81)
RDW: 17.4 % — AB (ref 11.5–15.5)
WBC: 12.3 10*3/uL — ABNORMAL HIGH (ref 4.0–10.5)

## 2014-02-20 LAB — I-STAT CG4 LACTIC ACID, ED: LACTIC ACID, VENOUS: 0.97 mmol/L (ref 0.5–2.2)

## 2014-02-20 LAB — COMPREHENSIVE METABOLIC PANEL
ALBUMIN: 2.7 g/dL — AB (ref 3.5–5.2)
ALT: 14 U/L (ref 0–53)
AST: 14 U/L (ref 0–37)
Alkaline Phosphatase: 67 U/L (ref 39–117)
Anion gap: 12 (ref 5–15)
BUN: 27 mg/dL — ABNORMAL HIGH (ref 6–23)
CALCIUM: 8.5 mg/dL (ref 8.4–10.5)
CO2: 21 mEq/L (ref 19–32)
CREATININE: 1.85 mg/dL — AB (ref 0.50–1.35)
Chloride: 93 mEq/L — ABNORMAL LOW (ref 96–112)
GFR calc Af Amer: 37 mL/min — ABNORMAL LOW (ref >90)
GFR calc non Af Amer: 32 mL/min — ABNORMAL LOW (ref >90)
Glucose, Bld: 100 mg/dL — ABNORMAL HIGH (ref 70–99)
Potassium: 4.9 mEq/L (ref 3.7–5.3)
Sodium: 126 mEq/L — ABNORMAL LOW (ref 137–147)
Total Bilirubin: 1.7 mg/dL — ABNORMAL HIGH (ref 0.3–1.2)
Total Protein: 6.4 g/dL (ref 6.0–8.3)

## 2014-02-20 LAB — I-STAT TROPONIN, ED: Troponin i, poc: 0.02 ng/mL (ref 0.00–0.08)

## 2014-02-20 LAB — PRO B NATRIURETIC PEPTIDE: Pro B Natriuretic peptide (BNP): 3557 pg/mL — ABNORMAL HIGH (ref 0–450)

## 2014-02-20 MED ORDER — MAGNESIUM CHLORIDE 64 MG PO TBEC
64.0000 mg | DELAYED_RELEASE_TABLET | Freq: Two times a day (BID) | ORAL | Status: DC
  Administered 2014-02-21 – 2014-02-26 (×12): 64 mg via ORAL
  Filled 2014-02-20 (×14): qty 1

## 2014-02-20 MED ORDER — OXYCODONE HCL 5 MG PO TABS
5.0000 mg | ORAL_TABLET | ORAL | Status: DC | PRN
  Administered 2014-02-23: 5 mg via ORAL
  Filled 2014-02-20: qty 1

## 2014-02-20 MED ORDER — ATORVASTATIN CALCIUM 20 MG PO TABS
20.0000 mg | ORAL_TABLET | Freq: Every day | ORAL | Status: DC
  Administered 2014-02-21 – 2014-02-22 (×2): 20 mg via ORAL
  Filled 2014-02-20 (×3): qty 1

## 2014-02-20 MED ORDER — SODIUM CHLORIDE 0.9 % IV SOLN
INTRAVENOUS | Status: DC
  Administered 2014-02-20 – 2014-02-22 (×2): via INTRAVENOUS

## 2014-02-20 MED ORDER — APIXABAN 5 MG PO TABS
5.0000 mg | ORAL_TABLET | Freq: Two times a day (BID) | ORAL | Status: DC
  Administered 2014-02-21 – 2014-02-26 (×12): 5 mg via ORAL
  Filled 2014-02-20 (×14): qty 1

## 2014-02-20 MED ORDER — HYDROMORPHONE HCL 1 MG/ML IJ SOLN: 0.5000 mg | INTRAMUSCULAR | Status: DC | PRN

## 2014-02-20 MED ORDER — VANCOMYCIN HCL 10 G IV SOLR
1250.0000 mg | INTRAVENOUS | Status: DC
  Administered 2014-02-20 – 2014-02-22 (×3): 1250 mg via INTRAVENOUS
  Filled 2014-02-20 (×5): qty 1250

## 2014-02-20 MED ORDER — AMIODARONE HCL 200 MG PO TABS
400.0000 mg | ORAL_TABLET | Freq: Every day | ORAL | Status: DC
  Administered 2014-02-21 – 2014-02-26 (×6): 400 mg via ORAL
  Filled 2014-02-20 (×6): qty 2

## 2014-02-20 MED ORDER — ALUM & MAG HYDROXIDE-SIMETH 200-200-20 MG/5ML PO SUSP: 30.0000 mL | Freq: Four times a day (QID) | ORAL | Status: DC | PRN

## 2014-02-20 MED ORDER — ACETAMINOPHEN 650 MG RE SUPP: 650.0000 mg | Freq: Four times a day (QID) | RECTAL | Status: DC | PRN

## 2014-02-20 MED ORDER — GUAIFENESIN ER 600 MG PO TB12
1200.0000 mg | ORAL_TABLET | Freq: Two times a day (BID) | ORAL | Status: DC
  Administered 2014-02-21 – 2014-02-26 (×12): 1200 mg via ORAL
  Filled 2014-02-20 (×14): qty 2

## 2014-02-20 MED ORDER — ACETAMINOPHEN 325 MG PO TABS
650.0000 mg | ORAL_TABLET | Freq: Four times a day (QID) | ORAL | Status: DC | PRN
Start: 2014-02-20 — End: 2014-02-26

## 2014-02-20 MED ORDER — OMEGA-3-ACID ETHYL ESTERS 1 G PO CAPS
1.0000 g | ORAL_CAPSULE | Freq: Every day | ORAL | Status: DC
Start: 2014-02-21 — End: 2014-02-26
  Administered 2014-02-21 – 2014-02-26 (×6): 1 g via ORAL
  Filled 2014-02-20 (×6): qty 1

## 2014-02-20 MED ORDER — FAMOTIDINE 20 MG PO TABS
20.0000 mg | ORAL_TABLET | Freq: Two times a day (BID) | ORAL | Status: DC
  Administered 2014-02-21 – 2014-02-26 (×12): 20 mg via ORAL
  Filled 2014-02-20 (×14): qty 1

## 2014-02-20 MED ORDER — ONDANSETRON HCL 4 MG PO TABS
4.0000 mg | ORAL_TABLET | Freq: Four times a day (QID) | ORAL | Status: DC | PRN
  Administered 2014-02-23: 4 mg via ORAL
  Filled 2014-02-20: qty 1

## 2014-02-20 MED ORDER — SODIUM CHLORIDE 0.9 % IV BOLUS (SEPSIS)
500.0000 mL | Freq: Once | INTRAVENOUS | Status: AC
  Administered 2014-02-20: 500 mL via INTRAVENOUS

## 2014-02-20 MED ORDER — VITAMIN E 45 MG (100 UNIT) PO CAPS
1000.0000 [IU] | ORAL_CAPSULE | Freq: Every day | ORAL | Status: DC
  Administered 2014-02-21 – 2014-02-26 (×6): 1000 [IU] via ORAL
  Filled 2014-02-20 (×7): qty 2

## 2014-02-20 MED ORDER — FLUTICASONE PROPIONATE 50 MCG/ACT NA SUSP
1.0000 | Freq: Two times a day (BID) | NASAL | Status: DC | PRN
  Filled 2014-02-20: qty 16

## 2014-02-20 MED ORDER — IPRATROPIUM-ALBUTEROL 0.5-2.5 (3) MG/3ML IN SOLN
3.0000 mL | RESPIRATORY_TRACT | Status: DC
  Administered 2014-02-21 (×4): 3 mL via RESPIRATORY_TRACT
  Filled 2014-02-20 (×4): qty 3

## 2014-02-20 MED ORDER — PIPERACILLIN-TAZOBACTAM 3.375 G IVPB
3.3750 g | Freq: Three times a day (TID) | INTRAVENOUS | Status: DC
  Administered 2014-02-20 – 2014-02-24 (×11): 3.375 g via INTRAVENOUS
  Filled 2014-02-20 (×16): qty 50

## 2014-02-20 MED ORDER — LORATADINE 10 MG PO TABS
10.0000 mg | ORAL_TABLET | Freq: Every day | ORAL | Status: DC
  Administered 2014-02-21 – 2014-02-26 (×6): 10 mg via ORAL
  Filled 2014-02-20 (×7): qty 1

## 2014-02-20 MED ORDER — ONDANSETRON HCL 4 MG/2ML IJ SOLN
4.0000 mg | Freq: Four times a day (QID) | INTRAMUSCULAR | Status: DC | PRN
  Administered 2014-02-21 – 2014-02-22 (×2): 4 mg via INTRAVENOUS
  Filled 2014-02-20 (×2): qty 2

## 2014-02-20 NOTE — ED Provider Notes (Signed)
CSN: [id number]     Arrival date & time 02/20/14  1813 History   First MD Initiated Contact with Patient 02/20/14 1814     Chief Complaint  Patient presents with  . Fatigue     (Consider location/radiation/quality/duration/timing/severity/associated sxs/prior Treatment) HPI Comments: Patient has an indwelling Foley catheter from previous hospitalization.  No recent trauma On Eliquis but denies head trauma   Patient is a 80 y.o. male presenting with altered mental status. History provided by: Son. The history is limited by the condition of the patient.  Altered Mental Status Presenting symptoms: confusion and disorientation   Presenting symptoms: no combativeness, no lethargy and no partial responsiveness   Presenting symptoms comment:  Weakness, generalized Severity:  Mild Most recent episode:  Yesterday Episode history:  Continuous Duration:  2 days Timing:  Constant Progression:  Worsening Chronicity:  New Context: recent illness (recently in hospital for pneumonia as well as heart failure)   Context: not alcohol use, not dementia and taking medications as prescribed   Associated symptoms: weakness   Associated symptoms: no abdominal pain, no difficulty breathing, no fever, no nausea, no seizures and no vomiting   Associated symptoms comment:  Patient denies any symptoms currently Has been weak and short or breath at times per son   Past Medical History  Diagnosis Date  . CAD (coronary artery disease), native coronary artery     Status post CABG  . Hypertension   . Hyperlipidemia   . GERD (gastroesophageal reflux disease)   . COPD (chronic obstructive pulmonary disease)   . CRF (chronic renal failure)   . Barrett's esophagus   . OP (osteoporosis)   . CAD (coronary artery disease) of artery bypass graft 07/04/2011    LIMA-LAD; SVG-OM2-OM3, SVG-RCA -- all patent as of 05/27/11  . Syncope 07/04/2011  . Pacemaker 07/04/2011  . Cardiomyopathy, ischemic 07/2011; 09/2012   2D Echo - EF 35-40, mod dilated LA; Relook Echo 09/2012: EF 40-45%  . Myocardial infarction     25 yrs ago  . Peripheral vascular disease   . Arthritis   . BPH (benign prostatic hyperplasia)   . Polyp of colon     removed  . Sleep apnea     STOP BANG SCORE 4  . AV block, 3rd degree     Post St. Jude pacemaker, EF 35-40, does have intermittent atrial tachycardia, either PAt or Afib  . BPH (benign prostatic hypertrophy) 11/15/2011  . Paroxysmal atrial fibrillation - now rate controlled (formally with RVR and Hospital) 10/03/2012  . CVA (cerebral infarction) 10/09/2012    Noted to be in atrial fibrillation with evaluation of pacemaker, prior to CVA. Right-sided MCA stroke with left arm weakness/mild paralysis -- notably improved   . Pneumonia 02/02/2014  . Shortness of breath    Past Surgical History  Procedure Laterality Date  . Pacemaker placement  2007  . Cholecystectomy  2005  . Coronary artery bypass graft  1978    X3  . Lesion excision      from penis  . Cardiac catheterization  06/06/2011    occluded proximal RCA, ostial LAD and mid Circumflex after OM 1.; [place] LIMA-LAD, patent SVG-OM1-OM 2, patent SVG-RCA. Reduced ejection fraction of 30-35%.  . Cardiac catheterization  06/14/2010    LIMA to LAD, SVG to RCA, SVG to OM1 Circumflex, 100% occluded RCA, 100% occluded circumfkex, 100% occluded LAD, no evidence of graft dysfunction, continue medical therapy  . Cardiac catheterization  04/12/2003    3 vessel coronary artery  disease, continue medical treatment  . Cardiac catheterization  10/07/2000    Severe 3 vessel coronary artery disease, continue medical treatment  . Doppler echocardiography  10/02/2012    EF 40-45%; possible grade 1 diastolic dysfunction, but A. fib precludes this. Mild atrial dilatation bilaterally. Moderately elevated pulmonary pressures of 40 mmHg.   Family History  Problem Relation Age of Onset  . Leukemia Father   . Heart disease Father   . Alzheimer's disease  Mother   . Arthritis Mother   . Lung cancer Son 19  . Alzheimer's disease Maternal Grandmother    History  Substance Use Topics  . Smoking status: Former Smoker -- 0.30 packs/day for 15 years    Types: Cigarettes    Quit date: 05/07/1985  . Smokeless tobacco: Never Used  . Alcohol Use: No    Review of Systems  Unable to perform ROS: Mental status change  Constitutional: Negative for fever.  Gastrointestinal: Negative for nausea, vomiting and abdominal pain.  Endocrine: Negative.   Neurological: Positive for weakness. Negative for seizures.  Psychiatric/Behavioral: Positive for confusion.      Allergies  Review of patient's allergies indicates no known allergies.  Home Medications   Prior to Admission medications   Medication Sig Start Date End Date Taking? Authorizing Provider  albuterol-ipratropium (COMBIVENT) 18-103 MCG/ACT inhaler Inhale 2 puffs into the lungs 2 (two) times daily.    Yes Historical Provider, MD  amiodarone (PACERONE) 400 MG tablet Take 1 tablet (400 mg total) by mouth daily. 02/06/14  Yes Marinda Elk, MD  apixaban (ELIQUIS) 5 MG TABS tablet Take 1 tablet (5 mg total) by mouth 2 (two) times daily. 11/26/13  Yes Marykay Lex, MD  atorvastatin (LIPITOR) 20 MG tablet Take 20 mg by mouth daily with breakfast.    Yes Historical Provider, MD  fluticasone (FLONASE) 50 MCG/ACT nasal spray Place 1 spray into both nostrils 2 (two) times daily as needed for allergies or rhinitis.   Yes Historical Provider, MD  Fluticasone-Salmeterol (ADVAIR DISKUS) 500-50 MCG/DOSE AEPB Inhale 1 puff into the lungs every 12 (twelve) hours.    Yes Historical Provider, MD  furosemide (LASIX) 40 MG tablet Take 1 tablet (40 mg total) by mouth daily. 02/06/14  Yes Marinda Elk, MD  guaiFENesin (MUCINEX) 600 MG 12 hr tablet Take 1,200 mg by mouth 2 (two) times daily.   Yes Historical Provider, MD  lisinopril (PRINIVIL,ZESTRIL) 2.5 MG tablet Take 0.5 tablets (1.25 mg total) by  mouth daily. 02/06/14  Yes Marinda Elk, MD  loratadine (CLARITIN) 10 MG tablet Take 10 mg by mouth daily with breakfast.    Yes Historical Provider, MD  Magnesium Chloride (SLOW-MAG) 535 (64 MG) MG TBCR Take 64 mg by mouth 2 (two) times daily.    Yes Historical Provider, MD  metoprolol (LOPRESSOR) 100 MG tablet Take 100 mg by mouth 2 (two) times daily.   Yes Historical Provider, MD  Omega-3 Fatty Acids (FISH OIL) 1000 MG CAPS Take 1,000 mg by mouth daily.    Yes Historical Provider, MD  potassium chloride (K-DUR,KLOR-CON) 10 MEQ tablet Take 10 mEq by mouth 2 (two) times daily.   Yes Historical Provider, MD  potassium chloride 20 MEQ/15ML (10%) solution Take 15 mLs (20 mEq total) by mouth daily. 02/06/14  Yes Marinda Elk, MD  predniSONE (DELTASONE) 5 MG tablet Take 5 mg by mouth daily.   Yes Historical Provider, MD  ranitidine (ZANTAC) 150 MG capsule Take 150 mg by mouth 2 (two) times  daily.    Yes Historical Provider, MD  tiotropium (SPIRIVA) 18 MCG inhalation capsule Place 18 mcg into inhaler and inhale daily with breakfast.    Yes Historical Provider, MD  Vitamin D, Ergocalciferol, (DRISDOL) 50000 UNITS CAPS Take 50,000 Units by mouth every 7 (seven) days. Take on Fridays   Yes Historical Provider, MD  vitamin E 1000 UNIT capsule Take 1,000 Units by mouth daily with breakfast.    Yes Historical Provider, MD   BP 97/58  Pulse 70  Temp(Src) 98.6 F (37 C) (Rectal)  Resp 25  Ht 6' 2.02" (1.88 m)  Wt 222 lb 3.6 oz (100.8 kg)  BMI 28.52 kg/m2  SpO2 99% Physical Exam  Vitals reviewed. Constitutional: He appears well-developed and well-nourished. No distress.  HENT:  Head: Normocephalic and atraumatic.  Mouth/Throat: Oropharynx is clear and moist. No oropharyngeal exudate.  Eyes: Conjunctivae and EOM are normal. Pupils are equal, round, and reactive to light. Right eye exhibits no discharge. Left eye exhibits no discharge. No scleral icterus.  Neck: Normal range of motion. Neck  supple.  Cardiovascular: Normal rate, regular rhythm, normal heart sounds and intact distal pulses.  Exam reveals no gallop and no friction rub.   No murmur heard. Pulmonary/Chest: Effort normal and breath sounds normal. No respiratory distress. He has no wheezes. He has no rales.  Oxygen saturation 92%  Abdominal: Soft. He exhibits no distension and no mass. There is no tenderness.  Musculoskeletal: Normal range of motion. He exhibits edema (1+ leg edema).  Neurological: He is alert. No cranial nerve deficit. He exhibits normal muscle tone. Coordination normal.  Oriented to place only can answer basic questions  Skin: Skin is warm. No rash noted. He is not diaphoretic.    ED Course  Procedures (including critical care time) Labs Review Labs Reviewed  CBC WITH DIFFERENTIAL - Abnormal; Notable for the following:    WBC 12.3 (*)    RDW 17.4 (*)    Neutrophils Relative % 80 (*)    Neutro Abs 9.9 (*)    All other components within normal limits  COMPREHENSIVE METABOLIC PANEL - Abnormal; Notable for the following:    Sodium 126 (*)    Chloride 93 (*)    Glucose, Bld 100 (*)    BUN 27 (*)    Creatinine, Ser 1.85 (*)    Albumin 2.7 (*)    Total Bilirubin 1.7 (*)    GFR calc non Af Amer 32 (*)    GFR calc Af Amer 37 (*)    All other components within normal limits  URINALYSIS, ROUTINE W REFLEX MICROSCOPIC - Abnormal; Notable for the following:    Hgb urine dipstick LARGE (*)    All other components within normal limits  PRO B NATRIURETIC PEPTIDE - Abnormal; Notable for the following:    Pro B Natriuretic peptide (BNP) 3557.0 (*)    All other components within normal limits  URINE MICROSCOPIC-ADD ON - Abnormal; Notable for the following:    Casts HYALINE CASTS (*)    Crystals CA OXALATE CRYSTALS (*)    All other components within normal limits  URINE CULTURE  CULTURE, BLOOD (ROUTINE X 2)  CULTURE, BLOOD (ROUTINE X 2)  MRSA PCR SCREENING  BASIC METABOLIC PANEL  CBC  I-STAT CG4  LACTIC ACID, ED  I-STAT TROPOININ, ED    Imaging Review Ct Head Wo Contrast  02/20/2014   CLINICAL DATA:  Altered mental status, initial evaluation  EXAM: CT HEAD WITHOUT CONTRAST  TECHNIQUE: Contiguous axial  images were obtained from the base of the skull through the vertex without intravenous contrast.  COMPARISON:  10/02/2012  FINDINGS: Moderate diffuse atrophy. Moderate low attenuation in the deep white matter. Focal encephalomalacia right parietal lobe stable. No evidence of hemorrhage or extra-axial fluid. No evidence of acute vascular territory infarct. Calvarium is intact. No significant inflammatory change in the visualized portions of the sinuses.  IMPRESSION: Chronic involutional change and prior infarct on the right with no acute findings.   Electronically Signed   By: Esperanza Heir M.D.   On: 02/20/2014 20:20   Dg Chest Portable 1 View  02/20/2014   CLINICAL DATA:  Cough. Weakness. Altered mental status. Ex-smoker.  EXAM: PORTABLE CHEST - 1 VIEW  COMPARISON:  02/04/2014.  FINDINGS: Stable enlarged cardiac silhouette, post CABG changes and right subclavian pacemaker leads. Stable prominence of the interstitial markings. No pleural fluid. Further decrease in right upper lobe airspace opacity. Interval mild airspace opacity in the lateral aspect of the left mid to upper lung zone. Unremarkable bones.  IMPRESSION: 1. Interval mild pneumonia in the left mid to upper lung zone. 2. Further improvement in right upper lobe pneumonia. 3. Stable cardiomegaly and chronic interstitial lung disease.   Electronically Signed   By: Gordan Payment M.D.   On: 02/20/2014 19:41     EKG Interpretation None      MDM   MDM: 80 year old with past medical history of coronary artery disease, hypertension, hyperlipidemia, COPD comes in with altered mental status. Exam as above. Concern for sepsis as mild hypotension. No fever. Labs show leukocytosis. Chest x-ray consistent with pneumonia. Mild hypoxia as well  and not requiring nasal cannula. Lactate normal. Given fluids with resolution of blood pressure. Treated for Hcap as recent hospitalization. Ams likely related to pneumonia and sepsis. Will admit. Given Vanco and Zosyn for symptoms .  Final diagnoses:  HCAP (healthcare-associated pneumonia)  Sepsis, due to unspecified organism    Admit    Pilar Jarvis, MD 02/20/14 2340

## 2014-02-20 NOTE — ED Notes (Signed)
Patient transported to CT 

## 2014-02-20 NOTE — H&P (Signed)
[facility] Admission History and Physical       VASHAWN WORCH UXL:[id number] DOB: [birth year] DOA: 02/20/2014  Referring physician:  PCP: Thayer Headings, MD  Specialists:   Chief Complaint:  Weakness  HPI: Richard Chavez is a 80 y.o. male with Multiple Medical Problems who was brought to the Ed due to decline over the past 3 days with increased weakness and inability to walk along with poor intake of foods and liquids.  He has had cough and congestion.  His son is at the bedside and also reports that he was confused today.  The son called [facility] and was advised to take him to the ED.   He was recently hospitalized for CAP, and also had a cardioversion on 02/05/2014.    He was found to have a LUL Pneumonia on Chest X-ray and was placed on IV Vancomycin and Zosyn to cover HCAP and referred for admission, he was also hypotensive.         Review of Systems:  Constitutional: No Weight Loss, No Weight Gain, Night Sweats, Fevers, Chills, Dizziness, Fatigue, +Generalized Weakness HEENT: No Headaches, Difficulty Swallowing,Tooth/Dental Problems,Sore Throat,  No Sneezing, Rhinitis, Ear Ache, Nasal Congestion, or Post Nasal Drip,  Cardio-vascular:  No Chest pain, Orthopnea, PND, Edema in Lower Extremities, Anasarca, Dizziness, Palpitations  Resp: No +Dyspnea, No DOE, +Non-Productive Cough, No Hemoptysis, No Wheezing.    GI: No Heartburn, Indigestion, Abdominal Pain, Nausea, Vomiting, Diarrhea, Hematemesis, Hematochezia, Melena, Change in Bowel Habits,  +Loss of Appetite  GU: No Dysuria, Change in Color of Urine, No Urgency or Frequency, No Flank pain.  Musculoskeletal: No Joint Pain or Swelling, No Decreased Range of Motion, No Back Pain.  Neurologic: No Syncope, No Seizures, Muscle Weakness, Paresthesia, Vision Disturbance or Loss, No Diplopia, No Vertigo, No Difficulty Walking,  Skin: No Rash or Lesions. Psych: No Change in Mood or Affect, No Depression or Anxiety, No Memory  loss, No Confusion, or Hallucinations   Past Medical History  Diagnosis Date  . CAD (coronary artery disease), native coronary artery     Status post CABG  . Hypertension   . Hyperlipidemia   . GERD (gastroesophageal reflux disease)   . COPD (chronic obstructive pulmonary disease)   . CRF (chronic renal failure)   . Barrett's esophagus   . OP (osteoporosis)   . CAD (coronary artery disease) of artery bypass graft 07/04/2011    LIMA-LAD; SVG-OM2-OM3, SVG-RCA -- all patent as of 05/27/11  . Syncope 07/04/2011  . Pacemaker 07/04/2011  . Cardiomyopathy, ischemic 07/2011; 09/2012    2D Echo - EF 35-40, mod dilated LA; Relook Echo 09/2012: EF 40-45%  . Myocardial infarction     25 yrs ago  . Peripheral vascular disease   . Arthritis   . BPH (benign prostatic hyperplasia)   . Polyp of colon     removed  . Sleep apnea     STOP BANG SCORE 4  . AV block, 3rd degree     Post St. Jude pacemaker, EF 35-40, does have intermittent atrial tachycardia, either PAt or Afib  . BPH (benign prostatic hypertrophy) 11/15/2011  . Paroxysmal atrial fibrillation - now rate controlled (formally with RVR and Hospital) 10/03/2012  . CVA (cerebral infarction) 10/09/2012    Noted to be in atrial fibrillation with evaluation of pacemaker, prior to CVA. Right-sided MCA stroke with left arm weakness/mild paralysis -- notably improved   . Pneumonia 02/02/2014  . Shortness of breath       Past  Surgical History  Procedure Laterality Date  . Pacemaker placement  2007  . Cholecystectomy  2005  . Coronary artery bypass graft  1978    X3  . Lesion excision      from penis  . Cardiac catheterization  06/06/2011    occluded proximal RCA, ostial LAD and mid Circumflex after OM 1.; [place] LIMA-LAD, patent SVG-OM1-OM 2, patent SVG-RCA. Reduced ejection fraction of 30-35%.  . Cardiac catheterization  06/14/2010    LIMA to LAD, SVG to RCA, SVG to OM1 Circumflex, 100% occluded RCA, 100% occluded circumfkex, 100% occluded LAD, no  evidence of graft dysfunction, continue medical therapy  . Cardiac catheterization  04/12/2003    3 vessel coronary artery disease, continue medical treatment  . Cardiac catheterization  10/07/2000    Severe 3 vessel coronary artery disease, continue medical treatment  . Doppler echocardiography  10/02/2012    EF 40-45%; possible grade 1 diastolic dysfunction, but A. fib precludes this. Mild atrial dilatation bilaterally. Moderately elevated pulmonary pressures of 40 mmHg.       Prior to Admission medications   Medication Sig Start Date End Date Taking? Authorizing Provider  albuterol-ipratropium (COMBIVENT) 18-103 MCG/ACT inhaler Inhale 2 puffs into the lungs 2 (two) times daily.    Yes Historical Provider, MD  amiodarone (PACERONE) 400 MG tablet Take 1 tablet (400 mg total) by mouth daily. 02/06/14  Yes Marinda Elk, MD  apixaban (ELIQUIS) 5 MG TABS tablet Take 1 tablet (5 mg total) by mouth 2 (two) times daily. 11/26/13  Yes Marykay Lex, MD  atorvastatin (LIPITOR) 20 MG tablet Take 20 mg by mouth daily with breakfast.    Yes Historical Provider, MD  fluticasone (FLONASE) 50 MCG/ACT nasal spray Place 1 spray into both nostrils 2 (two) times daily as needed for allergies or rhinitis.   Yes Historical Provider, MD  Fluticasone-Salmeterol (ADVAIR DISKUS) 500-50 MCG/DOSE AEPB Inhale 1 puff into the lungs every 12 (twelve) hours.    Yes Historical Provider, MD  furosemide (LASIX) 40 MG tablet Take 1 tablet (40 mg total) by mouth daily. 02/06/14  Yes Marinda Elk, MD  guaiFENesin (MUCINEX) 600 MG 12 hr tablet Take 1,200 mg by mouth 2 (two) times daily.   Yes Historical Provider, MD  lisinopril (PRINIVIL,ZESTRIL) 2.5 MG tablet Take 0.5 tablets (1.25 mg total) by mouth daily. 02/06/14  Yes Marinda Elk, MD  loratadine (CLARITIN) 10 MG tablet Take 10 mg by mouth daily with breakfast.    Yes Historical Provider, MD  Magnesium Chloride (SLOW-MAG) 535 (64 MG) MG TBCR Take 64 mg by  mouth 2 (two) times daily.    Yes Historical Provider, MD  metoprolol (LOPRESSOR) 100 MG tablet Take 100 mg by mouth 2 (two) times daily.   Yes Historical Provider, MD  Omega-3 Fatty Acids (FISH OIL) 1000 MG CAPS Take 1,000 mg by mouth daily.    Yes Historical Provider, MD  potassium chloride (K-DUR,KLOR-CON) 10 MEQ tablet Take 10 mEq by mouth 2 (two) times daily.   Yes Historical Provider, MD  potassium chloride 20 MEQ/15ML (10%) solution Take 15 mLs (20 mEq total) by mouth daily. 02/06/14  Yes Marinda Elk, MD  predniSONE (DELTASONE) 5 MG tablet Take 5 mg by mouth daily.   Yes Historical Provider, MD  ranitidine (ZANTAC) 150 MG capsule Take 150 mg by mouth 2 (two) times daily.    Yes Historical Provider, MD  tiotropium (SPIRIVA) 18 MCG inhalation capsule Place 18 mcg into inhaler and inhale daily with  breakfast.    Yes Historical Provider, MD  Vitamin D, Ergocalciferol, (DRISDOL) 50000 UNITS CAPS Take 50,000 Units by mouth every 7 (seven) days. Take on Fridays   Yes Historical Provider, MD  vitamin E 1000 UNIT capsule Take 1,000 Units by mouth daily with breakfast.    Yes Historical Provider, MD      No Known Allergies   Social History:  Walks with Dan HumphreysWalker Since last Hospitalization   reports that he quit smoking about 28 years ago. His smoking use included Cigarettes. He has a 4.5 pack-year smoking history. He has never used smokeless tobacco. He reports that he does not drink alcohol or use illicit drugs.     Family History  Problem Relation Age of Onset  . Leukemia Father   . Heart disease Father   . Alzheimer's disease Mother   . Arthritis Mother   . Lung cancer Son [id number]  . Alzheimer's disease Maternal Grandmother        Physical Exam:  GEN:  Pleasant Elderly Obese 80 y.o. Caucasian male examined and in no acute distress; cooperative with exam Filed Vitals:   02/20/14 2030 02/20/14 2045 02/20/14 2100 02/20/14 2115  BP: 110/58 101/53 111/48 98/60  Pulse: 98 76 75 74    Temp:      TempSrc:      Resp: 14 20 15 16   Height:      Weight:      SpO2: 95% 98% 97% 95%   Blood pressure 98/60, pulse 74, temperature 98.6 F (37 C), temperature source Rectal, resp. rate 16, height 6' 2.02" (1.88 m), weight 100.8 kg (222 lb 3.6 oz), SpO2 95.00%. PSYCH: He is alert and oriented x4; does not appear anxious does not appear depressed; affect is normal HEENT: Normocephalic and Atraumatic, Mucous membranes pink; PERRLA; EOM intact; Fundi:  Benign;  No scleral icterus, Nares: Patent, Oropharynx: Clear, Fair Dentition,    Neck:  FROM, No Cervical Lymphadenopathy nor Thyromegaly or Carotid Bruit; No JVD; Breasts:: Not examined CHEST WALL: No tenderness CHEST: Normal respiration, clear to auscultation bilaterally HEART: Regular rate and rhythm; no murmurs rubs or gallops BACK: No kyphosis or scoliosis; No CVA tenderness ABDOMEN: Positive Bowel Sounds, Obese, Soft Non-Tender; No Masses, No Organomegaly. Rectal Exam: Not done EXTREMITIES: No Cyanosis, Clubbing, or Edema; No Ulcerations. Genitalia: not examined PULSES: 2+ and symmetric SKIN: Normal hydration no rash or ulceration CNS:  Alert and Oriented x 4, Generalized Weakness, No Acute focal Deficits Vascular: pulses palpable throughout    Labs on Admission:  Basic Metabolic Panel:  Recent Labs Lab 02/20/14 1853  NA 126*  K 4.9  CL 93*  CO2 21  GLUCOSE 100*  BUN 27*  CREATININE 1.85*  CALCIUM 8.5   Liver Function Tests:  Recent Labs Lab 02/20/14 1853  AST 14  ALT 14  ALKPHOS 67  BILITOT 1.7*  PROT 6.4  ALBUMIN 2.7*   No results found for this basename: LIPASE, AMYLASE,  in the last 168 hours No results found for this basename: AMMONIA,  in the last 168 hours CBC:  Recent Labs Lab 02/20/14 1853  WBC 12.3*  NEUTROABS 9.9*  HGB 14.0  HCT 42.1  MCV 85.2  PLT 194   Cardiac Enzymes: No results found for this basename: CKTOTAL, CKMB, CKMBINDEX, TROPONINI,  in the last 168 hours  BNP (last  3 results)  Recent Labs  01/27/14 0929 02/03/14 0915 02/20/14 1853  PROBNP 6726.0* 3422.0* 3557.0*   CBG: No results found for this basename: GLUCAP,  in the last 168 hours  Radiological Exams on Admission: Ct Head Wo Contrast  02/20/2014   CLINICAL DATA:  Altered mental status, initial evaluation  EXAM: CT HEAD WITHOUT CONTRAST  TECHNIQUE: Contiguous axial images were obtained from the base of the skull through the vertex without intravenous contrast.  COMPARISON:  10/02/2012  FINDINGS: Moderate diffuse atrophy. Moderate low attenuation in the deep white matter. Focal encephalomalacia right parietal lobe stable. No evidence of hemorrhage or extra-axial fluid. No evidence of acute vascular territory infarct. Calvarium is intact. No significant inflammatory change in the visualized portions of the sinuses.  IMPRESSION: Chronic involutional change and prior infarct on the right with no acute findings.   Electronically Signed   By: Esperanza Heir M.D.   On: 02/20/2014 20:20   Dg Chest Portable 1 View  02/20/2014   CLINICAL DATA:  Cough. Weakness. Altered mental status. Ex-smoker.  EXAM: PORTABLE CHEST - 1 VIEW  COMPARISON:  02/04/2014.  FINDINGS: Stable enlarged cardiac silhouette, post CABG changes and right subclavian pacemaker leads. Stable prominence of the interstitial markings. No pleural fluid. Further decrease in right upper lobe airspace opacity. Interval mild airspace opacity in the lateral aspect of the left mid to upper lung zone. Unremarkable bones.  IMPRESSION: 1. Interval mild pneumonia in the left mid to upper lung zone. 2. Further improvement in right upper lobe pneumonia. 3. Stable cardiomegaly and chronic interstitial lung disease.   Electronically Signed   By: Gordan Payment M.D.   On: 02/20/2014 19:41     EKG: Independently reviewed.    Assessment/Plan:   80 y.o. male with  Principal Problem:   1.   HCAP (healthcare-associated pneumonia)   IV Vancomycin and  Zosyn   DuoNebs   O2 PRN  Active Problems:   2.   Sepsis   Blood cultures x2 Sent   IV Abx   Monitor Trend of WBCs     3.   Hypotension   Hold Anti-Hypertensives   Gentle IVFs     4.   Hyponatremia-     Hold lasix   IVFs with NSS   Monitor Trend of  Na+      4.   Chronic combined systolic and diastolic congestive heart failure   Has Elevated BNP but Around Same level   Monitor for signs and Sxs of Fluid Overload        5.   Chronic kidney disease, stage III (moderate)   Increased Cr level   Gentle IVFs   Monitor BUN/Cr level     6.   Paroxysmal a-fib   Had recent Cardioversion   Continue Eliquis and Amiodarone Rx        7.   Long-term (current) use of anticoagulants   On Eliquis Rx     Code Status:  FULL CODE     Family Communication:  Son at Bedside  Disposition Plan:    Inpatient to Stepdown  Time spent:  63 Minutes  Ron Parker [facility] Pager [phone]   If 7AM -7PM Please Contact the Day Rounding Team MD for [facility]  If 7PM-7AM, Please Contact Night-Floor Coverage  [email or web address] Password [email or web address] 02/20/2014, 10:30 PM

## 2014-02-20 NOTE — Progress Notes (Signed)
ANTIBIOTIC CONSULT NOTE - INITIAL  Pharmacy Consult for Zosyn and Vancomycin Indication: HCAP  No Known Allergies  Patient Measurements: Height: 6' 2.02" (188 cm) Weight: 222 lb 3.6 oz (100.8 kg) IBW/kg (Calculated) : 82.24 Adjusted Body Weight:   Vital Signs: Temp: 98.6 F (37 C) (10/17 1943) Temp Source: Rectal (10/17 1943) BP: 95/54 mmHg (10/17 1915) Pulse Rate: 74 (10/17 1915) Intake/Output from previous day:   Intake/Output from this shift: Total I/O In: -  Out: 450 [Urine:450]  Labs:  Recent Labs  02/20/14 1853  WBC 12.3*  HGB 14.0  PLT 194  CREATININE 1.85*   Estimated Creatinine Clearance: 37 ml/min (by C-G formula based on Cr of 1.85). No results found for this basename: VANCOTROUGH, Leodis Binet, VANCORANDOM, GENTTROUGH, GENTPEAK, GENTRANDOM, TOBRATROUGH, TOBRAPEAK, TOBRARND, AMIKACINPEAK, AMIKACINTROU, AMIKACIN,  in the last 72 hours   Microbiology: Recent Results (from the past 720 hour(s))  URINE CULTURE     Status: None   Collection Time    01/27/14  9:05 AM      Result Value Ref Range Status   Specimen Description URINE, CATHETERIZED   Final   Special Requests NONE   Final   Culture  Setup Time     Final   Value: 01/27/2014 14:30     Performed at [facility] Count     Final   Value: NO GROWTH     Performed at [facility]   Culture     Final   Value: NO GROWTH     Performed at [facility]   Report Status 01/28/2014 FINAL   Final  CULTURE, BLOOD (ROUTINE X 2)     Status: None   Collection Time    01/27/14  9:20 AM      Result Value Ref Range Status   Specimen Description BLOOD RIGHT FOREARM   Final   Special Requests BOTTLES DRAWN AEROBIC ONLY 10CC   Final   Culture  Setup Time     Final   Value: 01/27/2014 14:12     Performed at [facility]   Culture     Final   Value: NO GROWTH 5 DAYS     Performed at [facility]   Report Status 02/02/2014 FINAL   Final  CULTURE, BLOOD (ROUTINE X 2)      Status: None   Collection Time    01/27/14  9:29 AM      Result Value Ref Range Status   Specimen Description BLOOD LEFT ANTECUBITAL   Final   Special Requests BOTTLES DRAWN AEROBIC ONLY 5CC   Final   Culture  Setup Time     Final   Value: 01/27/2014 14:12     Performed at [facility]   Culture     Final   Value: NO GROWTH 5 DAYS     Performed at [facility]   Report Status 02/02/2014 FINAL   Final  MRSA PCR SCREENING     Status: None   Collection Time    01/27/14 11:05 AM      Result Value Ref Range Status   MRSA by PCR NEGATIVE  NEGATIVE Final   Comment:            The GeneXpert MRSA Assay (FDA     approved for NASAL specimens     only), is one component of a     comprehensive MRSA colonization     surveillance program. It is not  intended to diagnose MRSA     infection nor to guide or     monitor treatment for     MRSA infections.    Medical History: Past Medical History  Diagnosis Date  . CAD (coronary artery disease), native coronary artery     Status post CABG  . Hypertension   . Hyperlipidemia   . GERD (gastroesophageal reflux disease)   . COPD (chronic obstructive pulmonary disease)   . CRF (chronic renal failure)   . Barrett's esophagus   . OP (osteoporosis)   . CAD (coronary artery disease) of artery bypass graft 07/04/2011    LIMA-LAD; SVG-OM2-OM3, SVG-RCA -- all patent as of 05/27/11  . Syncope 07/04/2011  . Pacemaker 07/04/2011  . Cardiomyopathy, ischemic 07/2011; 09/2012    2D Echo - EF 35-40, mod dilated LA; Relook Echo 09/2012: EF 40-45%  . Myocardial infarction     25 yrs ago  . Peripheral vascular disease   . Arthritis   . BPH (benign prostatic hyperplasia)   . Polyp of colon     removed  . Sleep apnea     STOP BANG SCORE 4  . AV block, 3rd degree     Post St. Jude pacemaker, EF 35-40, does have intermittent atrial tachycardia, either PAt or Afib  . BPH (benign prostatic hypertrophy) 11/15/2011  . Paroxysmal atrial  fibrillation - now rate controlled (formally with [facility] and Hospital) 10/03/2012  . CVA (cerebral infarction) 10/09/2012    Noted to be in atrial fibrillation with evaluation of pacemaker, prior to CVA. Right-sided MCA stroke with left arm weakness/mild paralysis -- notably improved   . Pneumonia 02/02/2014  . Shortness of breath     Medications:  Scheduled:   Assessment: 80 yr old male tranported to The ED via EMS for a complaint of weakness and comfusion for the last 2-3 days.The pt is to be started on Zosyn and Vancomycin with pharmacy dosing.  Goal of Therapy:  Vancomycin trough level 15-20 mcg/ml  Plan:  Zosyn 3.375 Gm IV q8h Vancomycin 1250 mg IV q24h Vanc levels when appropriate    Eugene Garnetotter, Romaldo Saville Sue 02/20/2014,9:00 PM

## 2014-02-20 NOTE — ED Notes (Addendum)
Patient transported by [facility] for complaint of weakness x 2-3 days.  Son states patient has been unable to ambulate today and normally uses a walker.  Patient reports increased weakness all over for the last 2-3 days.  Also reports that he was unable to have home health come to house today and they advised to bring patient to the ED.  Son reports that patient has had confusion at times today.

## 2014-02-21 LAB — CBC
HEMATOCRIT: 38.8 % — AB (ref 39.0–52.0)
Hemoglobin: 12.9 g/dL — ABNORMAL LOW (ref 13.0–17.0)
MCH: 28.5 pg (ref 26.0–34.0)
MCHC: 33.2 g/dL (ref 30.0–36.0)
MCV: 85.8 fL (ref 78.0–100.0)
Platelets: 169 10*3/uL (ref 150–400)
RBC: 4.52 MIL/uL (ref 4.22–5.81)
RDW: 17.5 % — ABNORMAL HIGH (ref 11.5–15.5)
WBC: 7.6 10*3/uL (ref 4.0–10.5)

## 2014-02-21 LAB — BASIC METABOLIC PANEL
ANION GAP: 12 (ref 5–15)
BUN: 25 mg/dL — ABNORMAL HIGH (ref 6–23)
CALCIUM: 8 mg/dL — AB (ref 8.4–10.5)
CHLORIDE: 98 meq/L (ref 96–112)
CO2: 22 meq/L (ref 19–32)
Creatinine, Ser: 1.72 mg/dL — ABNORMAL HIGH (ref 0.50–1.35)
GFR calc Af Amer: 40 mL/min — ABNORMAL LOW (ref >90)
GFR calc non Af Amer: 34 mL/min — ABNORMAL LOW (ref >90)
GLUCOSE: 81 mg/dL (ref 70–99)
POTASSIUM: 5.9 meq/L — AB (ref 3.7–5.3)
Sodium: 132 mEq/L — ABNORMAL LOW (ref 137–147)

## 2014-02-21 LAB — POTASSIUM: POTASSIUM: 4.6 meq/L (ref 3.7–5.3)

## 2014-02-21 LAB — MRSA PCR SCREENING: MRSA by PCR: POSITIVE — AB

## 2014-02-21 MED ORDER — MUPIROCIN 2 % EX OINT
1.0000 "application " | TOPICAL_OINTMENT | Freq: Two times a day (BID) | CUTANEOUS | Status: AC
  Administered 2014-02-21 – 2014-02-25 (×10): 1 via NASAL
  Filled 2014-02-21 (×2): qty 22

## 2014-02-21 MED ORDER — IPRATROPIUM-ALBUTEROL 0.5-2.5 (3) MG/3ML IN SOLN
3.0000 mL | Freq: Four times a day (QID) | RESPIRATORY_TRACT | Status: DC
  Administered 2014-02-21 – 2014-02-22 (×3): 3 mL via RESPIRATORY_TRACT
  Filled 2014-02-21 (×3): qty 3

## 2014-02-21 MED ORDER — CHLORHEXIDINE GLUCONATE CLOTH 2 % EX PADS
6.0000 | MEDICATED_PAD | Freq: Every day | CUTANEOUS | Status: AC
  Administered 2014-02-21 – 2014-02-25 (×5): 6 via TOPICAL

## 2014-02-21 MED ORDER — PROMETHAZINE HCL 25 MG/ML IJ SOLN
6.2500 mg | Freq: Four times a day (QID) | INTRAMUSCULAR | Status: DC | PRN
  Administered 2014-02-21: 6.25 mg via INTRAVENOUS
  Filled 2014-02-21: qty 1

## 2014-02-21 NOTE — Progress Notes (Signed)
[place] TEAM 1 - Stepdown/ICU TEAM Progress Note  Richard Chavez OFH:[id number] DOB: Aug 30, 1926 DOA: 02/20/2014 PCP: Thayer Headings, MD  Admit HPI / Brief Narrative: 80 y.o. male who was brought to the ED due to 3 days with increased weakness and inability to walk along with poor intake. He had cough and congestion. He was recently hospitalized for CAP, and also had a cardioversion on 02/05/2014.   In the ED he was found to have a LUL infiltrate on Chest X-ray and was placed on IV Vancomycin and Zosyn.  HPI/Subjective: Pt resting in bed in good spirits.  More alert and interactive.  Denies focal complaints at this time.  Feels weak in general.    Assessment/Plan:  LUL HCAP w/ sepsis  tx for RUL CAP w/ d/c 10/3 - CXR now notes ?LUL infiltrate - cont empiric abx tx - afeb - WBC has quickly normalized - ?if this is a ture PNA - will complete short course of abx and follow - repeat CXR in AM  Hypotension due to hypovolemia / DH Resolved w/ volume expansion   Hyponatremia  Likely due to volume depletion - improving rapidly w/ IVF  CAD s/p CABG Asymptomatic at present   Chronic systolic CHF - Ischemic cardiomyopathy w/ EF 20-25% Sept 2015 Pt is actually dehydrated at present - avoid diuretic for now and follow  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  CKD Stage II crt 1.15 9/30 at time of d/c - crt improving w/ hydration - follow   Parox Afib  S/p cardioversion/overdrive pacing 10/2 - continue Eliquis and Amiodarone Rx - RRR at present   HLD  COPD  Hx of R MCA CVA  MRSA screen +  Code Status: FULL Family Communication: spoke w/ son at bedside  Disposition Plan: SDU  Consultants: none  Procedures: none  Antibiotics: Zosyn 10/17 > Vanc 10/17 >  DVT prophylaxis: eliquis   Objective: Blood pressure 126/68, pulse 75, temperature 97.2 F (36.2 C), temperature source Oral, resp. rate 17, height 6' (1.829 m), weight 100.8 kg (222 lb 3.6 oz), SpO2 98.00%.  Intake/Output  Summary (Last 24 hours) at 02/21/14 1155 Last data filed at 02/21/14 1000  Gross per 24 hour  Intake 2040.83 ml  Output    950 ml  Net 1090.83 ml    Exam: General: No acute respiratory distress at rest  Lungs: Clear to auscultation bilaterally without wheezes or crackles Cardiovascular: Regular rate and rhythm without murmur gallop or rub  Abdomen: Nontender, nondistended, soft, bowel sounds positive, no rebound, no ascites, no appreciable mass Extremities: No significant cyanosis, clubbing, or edema bilateral lower extremities  Data Reviewed: Basic Metabolic Panel:  Recent Labs Lab 02/20/14 1853 02/21/14 0320 02/21/14 0633  NA 126* 132*  --   K 4.9 5.9* 4.6  CL 93* 98  --   CO2 21 22  --   GLUCOSE 100* 81  --   BUN 27* 25*  --   CREATININE 1.85* 1.72*  --   CALCIUM 8.5 8.0*  --     Liver Function Tests:  Recent Labs Lab 02/20/14 1853  AST 14  ALT 14  ALKPHOS 67  BILITOT 1.7*  PROT 6.4  ALBUMIN 2.7*   CBC:  Recent Labs Lab 02/20/14 1853 02/21/14 0320  WBC 12.3* 7.6  NEUTROABS 9.9*  --   HGB 14.0 12.9*  HCT 42.1 38.8*  MCV 85.2 85.8  PLT 194 169   BNP (last 3 results)  Recent Labs  01/27/14 0929 02/03/14 0915  02/20/14 1853  PROBNP 6726.0* 3422.0* 3557.0*    Recent Results (from the past 240 hour(s))  MRSA PCR SCREENING     Status: Abnormal   Collection Time    02/20/14 11:34 PM      Result Value Ref Range Status   MRSA by PCR POSITIVE (*) NEGATIVE Final   Comment:            The GeneXpert MRSA Assay (FDA     approved for NASAL specimens     only), is one component of a     comprehensive MRSA colonization     surveillance program. It is not     intended to diagnose MRSA     infection nor to guide or     monitor treatment for     MRSA infections.     RESULT CALLED TO, READ BACK BY AND VERIFIED WITH:     SHAW,M RN [id number] 02/21/14 MITCHELL,L     Studies:  Recent x-ray studies have been reviewed in detail by the Attending  Physician  Scheduled Meds:  Scheduled Meds: . amiodarone  400 mg Oral Daily  . apixaban  5 mg Oral BID  . atorvastatin  20 mg Oral Q breakfast  . Chlorhexidine Gluconate Cloth  6 each Topical Q0600  . famotidine  20 mg Oral BID  . guaiFENesin  1,200 mg Oral BID  . ipratropium-albuterol  3 mL Nebulization Q4H  . loratadine  10 mg Oral Q breakfast  . magnesium chloride  64 mg Oral BID  . mupirocin ointment  1 application Nasal BID  . omega-3 acid ethyl esters  1 g Oral Daily  . piperacillin-tazobactam (ZOSYN)  IV  3.375 g Intravenous 3 times per day  . vancomycin  1,250 mg Intravenous Q24H  . vitamin E  1,000 Units Oral Q breakfast    Time spent on care of this patient: 35 mins   Richard Chavez T , MD   [facility] Office  [phone] Pager - Text Page per Loretha [email or web address] as per below:  On-Call/Text Page:      [email or web address]      password [email or web address]  If 7PM-7AM, please contact night-coverage [email or web address] Password [email or web address] 02/21/2014, 11:55 AM   LOS: 1 day

## 2014-02-21 NOTE — ED Provider Notes (Signed)
Medical screening examination/treatment/procedure(s) were conducted as a shared visit with non-physician practitioner(s) or resident  and myself.  I personally evaluated the patient during the encounter and agree with the findings.   I have personally reviewed any xrays and/ or EKG's with the provider and I agree with interpretation.   Elderly patient with multiple medical problems and recent hospitalization for pneumonia  presents with altered mental status.Clinically patient is generally weak, dry mucous membranes, clinically dehydrated, few rales bilateral. Chest x-ray consistent with pneumonia. Hospital-acquired antibiotics and admission for sepsis.     Enid Skeens, MD 02/21/14 [phone]

## 2014-02-21 NOTE — Plan of Care (Addendum)
Problem: Phase I Progression Outcomes Goal: Voiding-avoid urinary catheter unless indicated Pt admitted with coude urinary catheter from home. Seal broken prior to admission. Leg bag changed out to standard collection bag using sterile technique. Son and pt informed and educated.

## 2014-02-22 ENCOUNTER — Ambulatory Visit: Payer: Medicare Other | Admitting: Cardiology

## 2014-02-22 ENCOUNTER — Inpatient Hospital Stay (HOSPITAL_COMMUNITY): Payer: Medicare Other

## 2014-02-22 LAB — CBC
HEMATOCRIT: 38.4 % — AB (ref 39.0–52.0)
Hemoglobin: 12.9 g/dL — ABNORMAL LOW (ref 13.0–17.0)
MCH: 29.4 pg (ref 26.0–34.0)
MCHC: 33.6 g/dL (ref 30.0–36.0)
MCV: 87.5 fL (ref 78.0–100.0)
Platelets: 224 10*3/uL (ref 150–400)
RBC: 4.39 MIL/uL (ref 4.22–5.81)
RDW: 17.7 % — ABNORMAL HIGH (ref 11.5–15.5)
WBC: 6.7 10*3/uL (ref 4.0–10.5)

## 2014-02-22 LAB — COMPREHENSIVE METABOLIC PANEL
ALBUMIN: 2.4 g/dL — AB (ref 3.5–5.2)
ALK PHOS: 53 U/L (ref 39–117)
ALT: 12 U/L (ref 0–53)
ALT: 14 U/L (ref 0–53)
ANION GAP: 12 (ref 5–15)
ANION GAP: 14 (ref 5–15)
AST: 34 U/L (ref 0–37)
AST: 14 U/L (ref 0–37)
Albumin: 2.5 g/dL — ABNORMAL LOW (ref 3.5–5.2)
Alkaline Phosphatase: 58 U/L (ref 39–117)
BILIRUBIN TOTAL: 1 mg/dL (ref 0.3–1.2)
BUN: 18 mg/dL (ref 6–23)
BUN: 17 mg/dL (ref 6–23)
CALCIUM: 8.6 mg/dL (ref 8.4–10.5)
CHLORIDE: 99 meq/L (ref 96–112)
CO2: 22 mEq/L (ref 19–32)
CO2: 22 mEq/L (ref 19–32)
Calcium: 8.3 mg/dL — ABNORMAL LOW (ref 8.4–10.5)
Chloride: 100 mEq/L (ref 96–112)
Creatinine, Ser: 1.63 mg/dL — ABNORMAL HIGH (ref 0.50–1.35)
Creatinine, Ser: 1.51 mg/dL — ABNORMAL HIGH (ref 0.50–1.35)
GFR calc non Af Amer: 37 mL/min — ABNORMAL LOW (ref >90)
GFR calc non Af Amer: 40 mL/min — ABNORMAL LOW (ref >90)
GFR, EST AFRICAN AMERICAN: 43 mL/min — AB (ref >90)
GFR, EST AFRICAN AMERICAN: 47 mL/min — AB (ref >90)
GLUCOSE: 94 mg/dL (ref 70–99)
GLUCOSE: 93 mg/dL (ref 70–99)
POTASSIUM: 4.7 meq/L (ref 3.7–5.3)
Potassium: 5.6 mEq/L — ABNORMAL HIGH (ref 3.7–5.3)
SODIUM: 133 meq/L — AB (ref 137–147)
Sodium: 136 mEq/L — ABNORMAL LOW (ref 137–147)
TOTAL PROTEIN: 6.1 g/dL (ref 6.0–8.3)
Total Bilirubin: 1.2 mg/dL (ref 0.3–1.2)
Total Protein: 6.1 g/dL (ref 6.0–8.3)

## 2014-02-22 MED ORDER — PREDNISONE 5 MG PO TABS
5.0000 mg | ORAL_TABLET | Freq: Every day | ORAL | Status: DC
  Administered 2014-02-22 – 2014-02-26 (×5): 5 mg via ORAL
  Filled 2014-02-22 (×6): qty 1

## 2014-02-22 MED ORDER — PROMETHAZINE HCL 25 MG/ML IJ SOLN: 6.2500 mg | Freq: Four times a day (QID) | INTRAMUSCULAR | Status: DC | PRN

## 2014-02-22 MED ORDER — IPRATROPIUM-ALBUTEROL 0.5-2.5 (3) MG/3ML IN SOLN: 3.0000 mL | Freq: Four times a day (QID) | RESPIRATORY_TRACT | Status: DC | PRN

## 2014-02-22 MED ORDER — MOMETASONE FURO-FORMOTEROL FUM 200-5 MCG/ACT IN AERO
2.0000 | INHALATION_SPRAY | Freq: Two times a day (BID) | RESPIRATORY_TRACT | Status: DC
  Administered 2014-02-24 – 2014-02-26 (×4): 2 via RESPIRATORY_TRACT
  Filled 2014-02-22 (×2): qty 8.8

## 2014-02-22 MED ORDER — METOPROLOL TARTRATE 100 MG PO TABS
100.0000 mg | ORAL_TABLET | Freq: Two times a day (BID) | ORAL | Status: DC
  Administered 2014-02-22 – 2014-02-26 (×9): 100 mg via ORAL
  Filled 2014-02-22 (×11): qty 1

## 2014-02-22 MED ORDER — TIOTROPIUM BROMIDE MONOHYDRATE 18 MCG IN CAPS: 18.0000 ug | ORAL_CAPSULE | Freq: Every day | RESPIRATORY_TRACT | Status: DC

## 2014-02-22 MED ORDER — IPRATROPIUM-ALBUTEROL 0.5-2.5 (3) MG/3ML IN SOLN: 3.0000 mL | Freq: Three times a day (TID) | RESPIRATORY_TRACT | Status: DC

## 2014-02-22 NOTE — Progress Notes (Signed)
Dr Sharon Seller notified of positive blood culture

## 2014-02-22 NOTE — Progress Notes (Signed)
[facility] TEAM 1 - Stepdown/ICU TEAM Progress Note  Richard ShuttersFred M Chavez ZOX:[id number] DOB: [birth year] DOA: 02/20/2014 PCP: Thayer HeadingsMACKENZIE,BRIAN, MD  Admit HPI / Brief Narrative: 80 y.o. male who was brought to the ED due to 3 days with increased weakness and inability to walk along with poor intake. He had cough and congestion. He was recently hospitalized for CAP, and also had a cardioversion on 02/05/2014.   In the ED he was found to have a LUL infiltrate on Chest X-ray and was placed on IV Vancomycin and Zosyn.  HPI/Subjective: Pt states he "doesn't feel any better yet."  He has a poor appetite.  He had a BM yesterday.  He denies cp, n/v, or abdom pain, but states he simply feels "weak all over."    Assessment/Plan:  LUL HCAP w/ sepsis  tx for RUL CAP w/ d/c 10/3 - CXR now notes ?LUL infiltrate - cont empiric abx tx - afeb - WBC has quickly normalized - ?if this is a ture PNA - will complete short course of abx and follow - repeat CXR today suggests no change in B UL infiltrates   Hypotension due to hypovolemia / DH Resolved w/ volume expansion - slow IVF in setting of chronic CHF   Enterococcus UTI v/s colonization  UA was not c/w UTI, but culture has noted >100k Enterococcus - is already on Vanc for HCAP - f/u sensitivities   Hyponatremia  Likely due to volume depletion - improving rapidly w/ IVF - slow IVF and follow   CAD s/p CABG Asymptomatic at present   Chronic systolic CHF - Ischemic cardiomyopathy w/ EF 20-25% Sept 2015 Pt was actually dehydrated at presentation - now appears to be euvolemic - avoid diuretic for now and follow with slowing of IVF in setting of poor oral intake   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  CKD Stage II crt 1.15 9/30 at time of d/c - crt improving w/ hydration - follow   Parox Afib  S/p cardioversion/overdrive pacing 10/2 - continue Eliquis and Amiodarone Rx - RRR at present   HLD Resume tx once oral intake more consistent   COPD Well compensates at  present   Hx of R MCA CVA  MRSA screen +  Code Status: FULL Family Communication: spoke w/ son at bedside  Disposition Plan: stable for transfer to medical bed - begin PT/OT   Consultants: none  Procedures: none  Antibiotics: Zosyn 10/17 > Vanc 10/17 >  DVT prophylaxis: eliquis   Objective: Blood pressure 138/84, pulse 74, temperature 98.4 F (36.9 C), temperature source Oral, resp. rate 16, height 6' (1.829 m), weight 100.8 kg (222 lb 3.6 oz), SpO2 96.00%.  Intake/Output Summary (Last 24 hours) at 02/22/14 1114 Last data filed at 02/22/14 1000  Gross per 24 hour  Intake   1740 ml  Output   1510 ml  Net    230 ml    Exam: General: No acute respiratory distress at rest  Lungs: Clear to auscultation bilaterally without wheezes or crackles - distant bs th/o  Cardiovascular: Regular rate and rhythm without murmur gallop or rub  Abdomen: Nontender, nondistended, soft, bowel sounds positive, no rebound, no ascites, no appreciable mass Extremities: No significant cyanosis, clubbing, or edema bilateral lower extremities  Data Reviewed: Basic Metabolic Panel:  Recent Labs Lab 02/20/14 1853 02/21/14 0320 02/21/14 0633 02/22/14 0342  NA 126* 132*  --  133*  K 4.9 5.9* 4.6 5.6*  CL 93* 98  --  99  CO2  21 22  --  22  GLUCOSE 100* 81  --  94  BUN 27* 25*  --  18  CREATININE 1.85* 1.72*  --  1.63*  CALCIUM 8.5 8.0*  --  8.3*    Liver Function Tests:  Recent Labs Lab 02/20/14 1853 02/22/14 0342  AST 14 34  ALT 14 12  ALKPHOS 67 53  BILITOT 1.7* 1.0  PROT 6.4 6.1  ALBUMIN 2.7* 2.4*   CBC:  Recent Labs Lab 02/20/14 1853 02/21/14 0320 02/22/14 0342  WBC 12.3* 7.6 6.7  NEUTROABS 9.9*  --   --   HGB 14.0 12.9* 12.9*  HCT 42.1 38.8* 38.4*  MCV 85.2 85.8 87.5  PLT 194 169 224   BNP (last 3 results)  Recent Labs  01/27/14 0929 02/03/14 0915 02/20/14 1853  PROBNP 6726.0* 3422.0* 3557.0*    Recent Results (from the past 240 hour(s))  CULTURE,  BLOOD (ROUTINE X 2)     Status: None   Collection Time    02/20/14  6:53 PM      Result Value Ref Range Status   Specimen Description BLOOD RIGHT ANTECUBITAL   Final   Special Requests BOTTLES DRAWN AEROBIC AND ANAEROBIC 5CC EA   Final   Culture  Setup Time     Final   Value: 02/20/2014 23:35     Performed at [facility]   Culture     Final   Value:        BLOOD CULTURE RECEIVED NO GROWTH TO DATE CULTURE WILL BE HELD FOR 5 DAYS BEFORE ISSUING A FINAL NEGATIVE REPORT     Performed at [facility]   Report Status PENDING   Incomplete  CULTURE, BLOOD (ROUTINE X 2)     Status: None   Collection Time    02/20/14  7:03 PM      Result Value Ref Range Status   Specimen Description BLOOD RIGHT WRIST   Final   Special Requests BOTTLES DRAWN AEROBIC AND ANAEROBIC 5CC EA   Final   Culture  Setup Time     Final   Value: 02/20/2014 23:35     Performed at [facility]   Culture     Final   Value:        BLOOD CULTURE RECEIVED NO GROWTH TO DATE CULTURE WILL BE HELD FOR 5 DAYS BEFORE ISSUING A FINAL NEGATIVE REPORT     Performed at [facility]   Report Status PENDING   Incomplete  URINE CULTURE     Status: None   Collection Time    02/20/14  8:12 PM      Result Value Ref Range Status   Specimen Description URINE, CATHETERIZED   Final   Special Requests NONE   Final   Culture  Setup Time     Final   Value: 02/20/2014 20:33     Performed at [facility] Count     Final   Value: >=100,000 COLONIES/ML     Performed at [facility]   Culture     Final   Value: ENTEROCOCCUS SPECIES     Performed at [facility]   Report Status PENDING   Incomplete  MRSA PCR SCREENING     Status: Abnormal   Collection Time    02/20/14 11:34 PM      Result Value Ref Range Status   MRSA by PCR POSITIVE (*) NEGATIVE Final   Comment:  The GeneXpert MRSA Assay (FDA     approved for NASAL specimens     only), is one component of a      comprehensive MRSA colonization     surveillance program. It is not     intended to diagnose MRSA     infection nor to guide or     monitor treatment for     MRSA infections.     RESULT CALLED TO, READ BACK BY AND VERIFIED WITH:     SHAW,M RN 0134 02/21/14 MITCHELL,L     Studies:  Recent x-ray studies have been reviewed in detail by the Attending Physician  Scheduled Meds:  Scheduled Meds: . amiodarone  400 mg Oral Daily  . apixaban  5 mg Oral BID  . atorvastatin  20 mg Oral Q breakfast  . Chlorhexidine Gluconate Cloth  6 each Topical Q0600  . famotidine  20 mg Oral BID  . guaiFENesin  1,200 mg Oral BID  . ipratropium-albuterol  3 mL Nebulization TID  . loratadine  10 mg Oral Q breakfast  . magnesium chloride  64 mg Oral BID  . mupirocin ointment  1 application Nasal BID  . omega-3 acid ethyl esters  1 g Oral Daily  . piperacillin-tazobactam (ZOSYN)  IV  3.375 g Intravenous 3 times per day  . vancomycin  1,250 mg Intravenous Q24H  . vitamin E  1,000 Units Oral Q breakfast    Time spent on care of this patient: 35 mins   Cillian Gwinner T , MD   [facility] Office  [phone] Pager - Text Page per Loretha Stapler as per below:  On-Call/Text Page:      [email or web address]      password [email or web address]  If 7PM-7AM, please contact night-coverage [email or web address] Password [email or web address] 02/22/2014, 11:14 AM   LOS: 2 days

## 2014-02-22 NOTE — Progress Notes (Signed)
02/22/2014 2:37 PM  Pt transfer from 2H to 6E18 accompanied by son, with whom he lives.  Pt is alert and oriented, ambulates with two person assist (left sided weakness present residual from prior CVA).  Pt denies pain, non telemetry.  Vitals stable, full assessment to EPIC.  Skin intact.  Pt oriented to room and unit, and was instructed on how to utilize the call bell, to which he verbalized understanding.  Will continue to monitor patient. Theadora Rama

## 2014-02-22 NOTE — Progress Notes (Signed)
[facility]  Patient Status: Active (receiving services up to time of hospitalization)  [facility] is providing the following services: RN and PT  If patient discharges after hours, please call [phone].   Richard Chavez 02/22/2014, 10:30 AM

## 2014-02-22 NOTE — Progress Notes (Signed)
Pharmacist Heart Failure Core Measure Documentation  Assessment: Richard Chavez has an EF documented as 20-25% on 01/27/14  by 2D Echo.  Rationale: Heart failure patients with left ventricular systolic dysfunction (LVSD) and an EF < 40% should be prescribed an angiotensin converting enzyme inhibitor (ACEI) or angiotensin receptor blocker (ARB) at discharge unless a contraindication is documented in the medical record.  This patient is not currently on an ACEI or ARB for HF.  This note is being placed in the record in order to provide documentation that a contraindication to the use of these agents is present for this encounter.  ACE Inhibitor or Angiotensin Receptor Blocker is contraindicated (specify all that apply)  []   ACEI allergy AND ARB allergy []   Angioedema []   Moderate or severe aortic stenosis []   Hyperkalemia []   Hypotension []   Renal artery stenosis [x]   Worsening renal function, preexisting renal disease or dysfunction   Sallee Provencal 02/22/2014 2:31 PM

## 2014-02-23 DIAGNOSIS — R4182 Altered mental status, unspecified: Secondary | ICD-10-CM

## 2014-02-23 DIAGNOSIS — I10 Essential (primary) hypertension: Secondary | ICD-10-CM

## 2014-02-23 DIAGNOSIS — R339 Retention of urine, unspecified: Secondary | ICD-10-CM

## 2014-02-23 LAB — URINE CULTURE

## 2014-02-23 LAB — CBC
HEMATOCRIT: 42.9 % (ref 39.0–52.0)
HEMOGLOBIN: 14.3 g/dL (ref 13.0–17.0)
MCH: 29.1 pg (ref 26.0–34.0)
MCHC: 33.3 g/dL (ref 30.0–36.0)
MCV: 87.4 fL (ref 78.0–100.0)
Platelets: 180 10*3/uL (ref 150–400)
RBC: 4.91 MIL/uL (ref 4.22–5.81)
RDW: 17.5 % — ABNORMAL HIGH (ref 11.5–15.5)
WBC: 6.4 10*3/uL (ref 4.0–10.5)

## 2014-02-23 LAB — CULTURE, BLOOD (ROUTINE X 2)

## 2014-02-23 LAB — BASIC METABOLIC PANEL
ANION GAP: 13 (ref 5–15)
BUN: 14 mg/dL (ref 6–23)
CHLORIDE: 99 meq/L (ref 96–112)
CO2: 23 meq/L (ref 19–32)
Calcium: 8.5 mg/dL (ref 8.4–10.5)
Creatinine, Ser: 1.33 mg/dL (ref 0.50–1.35)
GFR calc non Af Amer: 47 mL/min — ABNORMAL LOW (ref >90)
GFR, EST AFRICAN AMERICAN: 55 mL/min — AB (ref >90)
Glucose, Bld: 70 mg/dL (ref 70–99)
POTASSIUM: 4.7 meq/L (ref 3.7–5.3)
SODIUM: 135 meq/L — AB (ref 137–147)

## 2014-02-23 LAB — MAGNESIUM: MAGNESIUM: 2.2 mg/dL (ref 1.5–2.5)

## 2014-02-23 LAB — VANCOMYCIN, TROUGH: VANCOMYCIN TR: 12.5 ug/mL (ref 10.0–20.0)

## 2014-02-23 MED ORDER — VANCOMYCIN HCL IN DEXTROSE 750-5 MG/150ML-% IV SOLN
750.0000 mg | Freq: Two times a day (BID) | INTRAVENOUS | Status: DC
  Filled 2014-02-23: qty 150

## 2014-02-23 NOTE — Progress Notes (Signed)
ANTIBIOTIC CONSULT NOTE - FOLLOW UP  Pharmacy Consult for vancomycin and zosyn Indication: pneumonia  No Known Allergies  Patient Measurements: Height: 6' (182.9 cm) Weight: 222 lb 3.6 oz (100.8 kg) IBW/kg (Calculated) : 77.6   Vital Signs: Temp: 97.7 F (36.5 C) (10/20 0444) Temp Source: Oral (10/20 0444) BP: 123/75 mmHg (10/20 0444) Pulse Rate: 73 (10/20 0444) Intake/Output from previous day: 10/19 0701 - 10/20 0700 In: 530 [P.O.:360; I.V.:120; IV Piggyback:50] Out: 900 [Urine:900] Intake/Output from this shift:    Labs:  Recent Labs  02/21/14 0320 02/22/14 0342 02/22/14 1024 02/23/14 0638  WBC 7.6 6.7  --  6.4  HGB 12.9* 12.9*  --  14.3  PLT 169 224  --  180  CREATININE 1.72* 1.63* 1.51* 1.33   Estimated Creatinine Clearance: 49.9 ml/min (by C-G formula based on Cr of 1.33). No results found for this basename: VANCOTROUGH, Leodis Binet, VANCORANDOM, GENTTROUGH, GENTPEAK, GENTRANDOM, TOBRATROUGH, TOBRAPEAK, TOBRARND, AMIKACINPEAK, AMIKACINTROU, AMIKACIN,  in the last 72 hours   Microbiology: Recent Results (from the past 720 hour(s))  URINE CULTURE     Status: None   Collection Time    01/27/14  9:05 AM      Result Value Ref Range Status   Specimen Description URINE, CATHETERIZED   Final   Special Requests NONE   Final   Culture  Setup Time     Final   Value: 01/27/2014 14:30     Performed at [facility] Count     Final   Value: NO GROWTH     Performed at [facility]   Culture     Final   Value: NO GROWTH     Performed at [facility]   Report Status 01/28/2014 FINAL   Final  CULTURE, BLOOD (ROUTINE X 2)     Status: None   Collection Time    01/27/14  9:20 AM      Result Value Ref Range Status   Specimen Description BLOOD RIGHT FOREARM   Final   Special Requests BOTTLES DRAWN AEROBIC ONLY 10CC   Final   Culture  Setup Time     Final   Value: 01/27/2014 14:12     Performed at [facility]   Culture     Final    Value: NO GROWTH 5 DAYS     Performed at [facility]   Report Status 02/02/2014 FINAL   Final  CULTURE, BLOOD (ROUTINE X 2)     Status: None   Collection Time    01/27/14  9:29 AM      Result Value Ref Range Status   Specimen Description BLOOD LEFT ANTECUBITAL   Final   Special Requests BOTTLES DRAWN AEROBIC ONLY 5CC   Final   Culture  Setup Time     Final   Value: 01/27/2014 14:12     Performed at [facility]   Culture     Final   Value: NO GROWTH 5 DAYS     Performed at [facility]   Report Status 02/02/2014 FINAL   Final  MRSA PCR SCREENING     Status: None   Collection Time    01/27/14 11:05 AM      Result Value Ref Range Status   MRSA by PCR NEGATIVE  NEGATIVE Final   Comment:            The GeneXpert MRSA Assay (FDA     approved for NASAL specimens  only), is one component of a     comprehensive MRSA colonization     surveillance program. It is not     intended to diagnose MRSA     infection nor to guide or     monitor treatment for     MRSA infections.  CULTURE, BLOOD (ROUTINE X 2)     Status: None   Collection Time    02/20/14  6:53 PM      Result Value Ref Range Status   Specimen Description BLOOD RIGHT ANTECUBITAL   Final   Special Requests BOTTLES DRAWN AEROBIC AND ANAEROBIC 5CC EA   Final   Culture  Setup Time     Final   Value: 02/20/2014 23:35     [facility]   Culture     Final   Value: GRAM POSITIVE COCCI IN PAIRS     Note: Gram Stain Report Called to,Read Back By and Verified With: STEWART WATERS @ 1231 ON [id number] BY [facility]     [facility]   Report Status PENDING   Incomplete  CULTURE, BLOOD (ROUTINE X 2)     Status: None   Collection Time    02/20/14  7:03 PM      Result Value Ref Range Status   Specimen Description BLOOD RIGHT WRIST   Final   Special Requests BOTTLES DRAWN AEROBIC AND ANAEROBIC 5CC EA   Final   Culture  Setup Time     Final   Value: 02/20/2014 23:35      [facility]   Culture     Final   Value:        BLOOD CULTURE RECEIVED NO GROWTH TO DATE CULTURE WILL BE HELD FOR 5 DAYS BEFORE ISSUING A FINAL NEGATIVE REPORT     [facility]   Report Status PENDING   Incomplete  URINE CULTURE     Status: None   Collection Time    02/20/14  8:12 PM      Result Value Ref Range Status   Specimen Description URINE, CATHETERIZED   Final   Special Requests NONE   Final   Culture  Setup Time     Final   Value: 02/20/2014 20:33     [facility]   Colony Count     Final   Value: >=100,000 COLONIES/ML     [facility]   Culture     Final   Value: ENTEROCOCCUS SPECIES     [facility]   Report Status PENDING   Incomplete  MRSA PCR SCREENING     Status: Abnormal   Collection Time    02/20/14 11:34 PM      Result Value Ref Range Status   MRSA by PCR POSITIVE (*) NEGATIVE Final   Comment:            The GeneXpert MRSA Assay (FDA     approved for NASAL specimens     only), is one component of a     comprehensive MRSA colonization     surveillance program. It is not     intended to diagnose MRSA     infection nor to guide or     monitor treatment for     MRSA infections.     RESULT CALLED TO, READ BACK BY AND VERIFIED WITH:     SHAW,M RN 0134 02/21/14 MITCHELL,L  Anti-infectives   Start     Dose/Rate Route Frequency Ordered Stop   02/20/14 2200  piperacillin-tazobactam (ZOSYN) IVPB 3.375 g     3.375 g 12.5 mL/hr over 240 Minutes Intravenous 3 times per day 02/20/14 2050     02/20/14 2200  vancomycin (VANCOCIN) 1,250 mg in sodium chloride 0.9 % 250 mL IVPB     1,250 mg 166.7 mL/hr over 90 Minutes Intravenous Every 24 hours 02/20/14 2050        Assessment: Patient is an 80 y.o M on vancomycin and zosyn for PNA, UTI, and r/o sepsis.  SCr continues to trend down to 1.33 with hydration.  Afeb, wbc wnl  Vanc 10/17 > Zosyn 10/17 >  10/17  BCx x 2 > 1/2 GPC pairs 10/17 UCx >100K Enterococcus (sensitivities pending) 10/17 MRSA PCR (+)  Goal of Therapy:  Vancomycin trough level 15-20 mcg/ml  Plan:  - cont Vancomycin 1250 mg IV q 24 hr - cont Zosyn 3.375g IV q 8 (EI) - with changing renal function will check vancomycin trough level tonight to assess current vancomycin regimen.   Jamille Yoshino P 02/23/2014,10:19 AM

## 2014-02-23 NOTE — Progress Notes (Signed)
ANTIBIOTIC CONSULT NOTE  Pharmacy Consult for vancomycin  Indication: pneumonia  No Known Allergies  Patient Measurements: Height: 6' (182.9 cm) Weight: 228 lb 12.8 oz (103.783 kg) IBW/kg (Calculated) : 77.6   Vital Signs: Temp: 97.7 F (36.5 C) (10/20 2007) Temp Source: Oral (10/20 2007) BP: 137/76 mmHg (10/20 2007) Pulse Rate: 76 (10/20 2007) Intake/Output from previous day: 10/19 0701 - 10/20 0700 In: 530 [P.O.:360; I.V.:120; IV Piggyback:50] Out: 900 [Urine:900] Intake/Output from this shift:    Labs:  Recent Labs  02/21/14 0320 02/22/14 0342 02/22/14 1024 02/23/14 0638  WBC 7.6 6.7  --  6.4  HGB 12.9* 12.9*  --  14.3  PLT 169 224  --  180  CREATININE 1.72* 1.63* 1.51* 1.33   Estimated Creatinine Clearance: 50.6 ml/min (by C-G formula based on Cr of 1.33).  Recent Labs  02/23/14 2130  VANCOTROUGH 12.5     Microbiology: Recent Results (from the past 720 hour(s))  URINE CULTURE     Status: None   Collection Time    01/27/14  9:05 AM      Result Value Ref Range Status   Specimen Description URINE, CATHETERIZED   Final   Special Requests NONE   Final   Culture  Setup Time     Final   Value: 01/27/2014 14:30     [facility]   Colony Count     Final   Value: NO GROWTH     [facility]   Culture     Final   Value: NO GROWTH     [facility]   Report Status 01/28/2014 FINAL   Final  CULTURE, BLOOD (ROUTINE X 2)     Status: None   Collection Time    01/27/14  9:20 AM      Result Value Ref Range Status   Specimen Description BLOOD RIGHT FOREARM   Final   Special Requests BOTTLES DRAWN AEROBIC ONLY 10CC   Final   Culture  Setup Time     Final   Value: 01/27/2014 14:12     [facility]   Culture     Final   Value: NO GROWTH 5 DAYS     [facility]   Report Status 02/02/2014 FINAL   Final  CULTURE, BLOOD (ROUTINE X 2)     Status: None   Collection Time    01/27/14  9:29 AM      Result Value Ref Range Status   Specimen Description BLOOD LEFT ANTECUBITAL   Final   Special Requests BOTTLES DRAWN AEROBIC ONLY 5CC   Final   Culture  Setup Time     Final   Value: 01/27/2014 14:12     [facility]   Culture     Final   Value: NO GROWTH 5 DAYS     [facility]   Report Status 02/02/2014 FINAL   Final  MRSA PCR SCREENING     Status: None   Collection Time    01/27/14 11:05 AM      Result Value Ref Range Status   MRSA by PCR NEGATIVE  NEGATIVE Final   Comment:            The GeneXpert MRSA Assay (FDA     approved for NASAL specimens     only), is one component of a     comprehensive MRSA colonization  surveillance program. It is not     intended to diagnose MRSA     infection nor to guide or     monitor treatment for     MRSA infections.  CULTURE, BLOOD (ROUTINE X 2)     Status: None   Collection Time    02/20/14  6:53 PM      Result Value Ref Range Status   Specimen Description BLOOD RIGHT ANTECUBITAL   Final   Special Requests BOTTLES DRAWN AEROBIC AND ANAEROBIC 5CC EA   Final   Culture  Setup Time     Final   Value: 02/20/2014 23:35     Performed at [facility]   Culture     Final   Value: STAPHYLOCOCCUS SPECIES (COAGULASE NEGATIVE)     Note: THE SIGNIFICANCE OF ISOLATING THIS ORGANISM FROM A SINGLE SET OF BLOOD CULTURES WHEN MULTIPLE SETS ARE DRAWN IS UNCERTAIN. PLEASE NOTIFY THE MICROBIOLOGY DEPARTMENT WITHIN ONE WEEK IF SPECIATION AND SENSITIVITIES ARE REQUIRED.     Note: Gram Stain Report Called to,Read Back By and Verified With: STEWART WATERS @ 1231 ON [id number] BY [facility]     Performed at [facility]   Report Status 02/23/2014 FINAL   Final  CULTURE, BLOOD (ROUTINE X 2)     Status: None   Collection Time    02/20/14  7:03 PM      Result Value Ref Range Status   Specimen Description BLOOD RIGHT WRIST   Final   Special Requests BOTTLES DRAWN  AEROBIC AND ANAEROBIC 5CC EA   Final   Culture  Setup Time     Final   Value: 02/20/2014 23:35     Performed at [facility]   Culture     Final   Value:        BLOOD CULTURE RECEIVED NO GROWTH TO DATE CULTURE WILL BE HELD FOR 5 DAYS BEFORE ISSUING A FINAL NEGATIVE REPORT     Performed at [facility]   Report Status PENDING   Incomplete  URINE CULTURE     Status: None   Collection Time    02/20/14  8:12 PM      Result Value Ref Range Status   Specimen Description URINE, CATHETERIZED   Final   Special Requests NONE   Final   Culture  Setup Time     Final   Value: 02/20/2014 20:33     Performed at [facility] Count     Final   Value: >=100,000 COLONIES/ML     Performed at [facility]   Culture     Final   Value: ENTEROCOCCUS SPECIES     Performed at [facility]   Report Status 02/23/2014 FINAL   Final   Organism ID, Bacteria ENTEROCOCCUS SPECIES   Final  MRSA PCR SCREENING     Status: Abnormal   Collection Time    02/20/14 11:34 PM      Result Value Ref Range Status   MRSA by PCR POSITIVE (*) NEGATIVE Final   Comment:            The GeneXpert MRSA Assay (FDA     approved for NASAL specimens     only), is one component of a     comprehensive MRSA colonization     surveillance program. It is not     intended to diagnose MRSA     infection nor to guide or     monitor  treatment for     MRSA infections.     RESULT CALLED TO, READ BACK BY AND VERIFIED WITH:     SHAW,M RN 0134 02/21/14 MITCHELL,L    Anti-infectives   Start     Dose/Rate Route Frequency Ordered Stop   02/20/14 2200  piperacillin-tazobactam (ZOSYN) IVPB 3.375 g     3.375 g 12.5 mL/hr over 240 Minutes Intravenous 3 times per day 02/20/14 2050     02/20/14 2200  vancomycin (VANCOCIN) 1,250 mg in sodium chloride 0.9 % 250 mL IVPB     1,250 mg 166.7 mL/hr over 90 Minutes Intravenous Every 24 hours 02/20/14 2050        Assessment: 80 yo male with PNA,  Enterococcal UTI, for empiric antibiotics  Goal of Therapy:  Vancomycin trough level 15-20 mcg/ml  Plan:  Change vancomycin 750 mg IV q12h with next dose   Richard Chavez, Richard Chavez 02/23/2014,11:16 PM

## 2014-02-23 NOTE — Evaluation (Signed)
Physical Therapy Evaluation Patient Details Name: Richard Chavez MRN: [id number] DOB: 1926/07/21 Today's Date: 02/23/2014   History of Present Illness  Adm 02/20/14 with AMS, weakness and found to have pneumonia, sepsis, and UTI. (Pt had recent admission 09/15 for pneumonia). PMHx- Rt MCA CVA, CABG, CHF with EF 20-25%, afib, and pacemaker  Clinical Impression  Pt admitted with above diagnoses. Pt with recent admission and discharge home with HHPT (had refused SNF for rehab). Pt currently limited by AMS and general discomfort in abd. Pt currently with functional limitations due to the deficits listed below (see PT Problem List).  Pt will benefit from skilled PT to increase their independence and safety with mobility to allow discharge to the venue listed below.       Follow Up Recommendations Home health PT;Supervision/Assistance - 24 hour (if pt progresses to level son can manage--has refused SNF previously)    Equipment Recommendations  None recommended by PT    Recommendations for Other Services       Precautions / Restrictions Precautions Precautions: Fall Restrictions Weight Bearing Restrictions: No      Mobility  Bed Mobility Overal bed mobility: Needs Assistance Bed Mobility: Rolling;Sidelying to Sit;Sit to Supine Rolling: Supervision Sidelying to sit: Min assist;HOB elevated   Sit to supine: Min guard   General bed mobility comments: HOB 20 with use of rail to come to sit; vc for sequencing, assist due to weakness  Transfers Overall transfer level: Needs assistance Equipment used: Rolling walker (2 wheeled) Transfers: Sit to/from Stand Sit to Stand: Min assist         General transfer comment: vc for safe use of RW and sequencing; x 2 from bed at lowest height  Ambulation/Gait Ambulation/Gait assistance: Min assist Ambulation Distance (Feet): 3 Feet Assistive device: Rolling walker (2 wheeled) Gait Pattern/deviations:  (side step to [facility]; pt refused  further)        Stairs            Wheelchair Mobility    Modified Rankin (Stroke Patients Only)       Balance Overall balance assessment: Needs assistance Sitting-balance support: Feet supported;No upper extremity supported Sitting balance-Leahy Scale: Fair     Standing balance support: Bilateral upper extremity supported Standing balance-Leahy Scale: Poor                               Pertinent Vitals/Pain Pain Assessment: Faces Faces Pain Scale: Hurts even more Pain Location: abdomen Pain Descriptors / Indicators: Discomfort Pain Intervention(s): Limited activity within patient's tolerance;Monitored during session;Repositioned (RN notified)    Home Living Family/patient expects to be discharged to:: Private residence Living Arrangements: Children Available Help at Discharge: Family;Available 24 hours/day Type of Home: House Home Access: Stairs to enter Entrance Stairs-Rails: None   Home Layout: One level Home Equipment: Grab bars - tub/shower;Walker - 2 wheels;Cane - single point;Shower seat;Bedside commode;Wheelchair - manual      Prior Function Level of Independence: Needs assistance   Gait / Transfers Assistance Needed: sit to stand min assist; supervision with rw; min assist on steps  ADL's / Homemaking Assistance Needed: assist with bath at sink and dressing  Comments: prior to 09/15 hospitalization pt ambulated with no device independently (including up/down stairs)     Hand Dominance   Dominant Hand: Left    Extremity/Trunk Assessment   Upper Extremity Assessment: Generalized weakness           Lower Extremity Assessment:  Generalized weakness (Lt knee 4/5, hip 5/5 (supine))      Cervical / Trunk Assessment: Normal  Communication   Communication: Expressive difficulties (at times difficult to understand)  Cognition Arousal/Alertness: Lethargic Behavior During Therapy: Flat affect Overall Cognitive Status:  Impaired/Different from baseline Area of Impairment: Attention;Memory;Following commands;Awareness;Problem solving   Current Attention Level: Sustained Memory: Decreased short-term memory Following Commands: Follows one step commands inconsistently;Follows one step commands with increased time   Awareness: Intellectual (not yet intellectual with regards to reason for adm) Problem Solving: Slow processing;Decreased initiation;Difficulty sequencing;Requires verbal cues;Requires tactile cues General Comments: difficulty following commands on Rt (that he had followed with Lt extremities moments before); unable to problem solve how to sit at EOB    General Comments General comments (skin integrity, edema, etc.): pt repeated numerous times that he "just doesn't feel well" and reports having lots of "gas burps"     Exercises        Assessment/Plan    PT Assessment Patient needs continued PT services  PT Diagnosis Generalized weakness   PT Problem List Decreased strength;Decreased activity tolerance;Decreased balance;Decreased mobility;Decreased cognition;Decreased knowledge of use of DME  PT Treatment Interventions DME instruction;Gait training;Stair training;Functional mobility training;Therapeutic activities;Therapeutic exercise;Balance training;Cognitive remediation;Patient/family education   PT Goals (Current goals can be found in the Care Plan section) Acute Rehab PT Goals Patient Stated Goal: to feel better PT Goal Formulation: With patient/family Time For Goal Achievement: 03/09/14 Potential to Achieve Goals: Fair    Frequency Min 3X/week   Barriers to discharge        Co-evaluation               End of Session Equipment Utilized During Treatment: Gait belt Activity Tolerance: Treatment limited secondary to medical complications (Comment) (general not feeling well; abdomen) Patient left: in bed;with call bell/phone within reach;with bed alarm set;with family/visitor  present Nurse Communication: Mobility status;Other (comment) (son's report that pt has declined since 10/18)         Time: 1214-1239 PT Time Calculation (min): 25 min   Charges:   PT Evaluation $Initial PT Evaluation Tier I: 1 Procedure PT Treatments $Gait Training: 8-22 mins   PT G Codes:          Wenzel Backlund 02/23/2014, 12:57 PM Pager [phone]

## 2014-02-23 NOTE — Progress Notes (Signed)
Progress Note  Richard NAIMI RAQ:[id number] DOB: [birth year] DOA: 02/20/2014 PCP: Thayer Headings, MD  Admit HPI / Brief Narrative: 80 y.o. male who was brought to the ED due to 3 days with increased weakness and inability to walk along with poor intake. He had cough and congestion. He was recently hospitalized for CAP, and also had a cardioversion on 02/05/2014.   In the ED he was found to have a LUL infiltrate on Chest X-ray and was placed on IV Vancomycin and Zosyn.  HPI/Subjective: Still not feeling better Does not like the food ehre  -per son, has confusion when sick  Assessment/Plan:  LUL HCAP w/ sepsis  tx for RUL CAP w/ d/c 10/3 - CXR now notes ?LUL infiltrate - cont empiric abx tx - afeb - WBC has quickly normalized  - will complete short course of abx and follow - repeat CXR suggests no change in B UL infiltrates   Hypotension due to hypovolemia / DH Resolved w/ volume expansion - slow IVF in setting of chronic CHF   Enterococcus UTI v/s colonization  UA was not c/w UTI, but culture has noted >100k Enterococcus - is already on Vanc for HCAP - f/u sensitivities  -has indwelling foley from acute urinary retention follows up with urology in 1 week  Hyponatremia  Likely due to volume depletion - improving rapidly w/ IVF - slow IVF and follow   CAD s/p CABG Asymptomatic at present   Chronic systolic CHF - Ischemic cardiomyopathy w/ EF 20-25% Sept 2015 Pt was actually dehydrated at presentation - now appears to be euvolemic - avoid diuretic for now and follow with slowing of IVF in setting of poor oral intake   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  CKD Stage II crt 1.15 9/30 at time of d/c - crt improving w/ hydration - follow   Parox Afib  S/p cardioversion/overdrive pacing 10/2 - continue Eliquis and Amiodarone Rx - RRR at present   HLD Resume tx once oral intake more consistent   COPD Well compensates at present   Hx of R MCA CVA  MRSA screen +  Code Status:  FULL Family Communication: spoke w/ son at bedside  Disposition Plan:  PT/OT   Consultants: none  Procedures: none  Antibiotics: Zosyn 10/17 > Vanc 10/17 >  DVT prophylaxis: eliquis   Objective: Blood pressure 130/63, pulse 80, temperature 98.1 F (36.7 C), temperature source Oral, resp. rate 18, height 6' (1.829 m), weight 100.8 kg (222 lb 3.6 oz), SpO2 100.00%.  Intake/Output Summary (Last 24 hours) at 02/23/14 1056 Last data filed at 02/23/14 0200  Gross per 24 hour  Intake    360 ml  Output    900 ml  Net   -540 ml    Exam: General: pleasant/cooperative Lungs: Clear to auscultation bilaterally without wheezes or crackles - distant bs th/o  Cardiovascular: Regular rate and rhythm without murmur gallop or rub  Abdomen: Nontender, nondistended, soft, bowel sounds positive, no rebound, no ascites, no appreciable mass Extremities: No significant cyanosis, clubbing, or edema bilateral lower extremities  Data Reviewed: Basic Metabolic Panel:  Recent Labs Lab 02/20/14 1853 02/21/14 0320 02/21/14 4562 02/22/14 0342 02/22/14 1024 02/23/14 0638  NA 126* 132*  --  133* 136* 135*  K 4.9 5.9* 4.6 5.6* 4.7 4.7  CL 93* 98  --  99 100 99  CO2 21 22  --  22 22 23   GLUCOSE 100* 81  --  94 93 70  BUN 27* 25*  --  18 17 14   CREATININE 1.85* 1.72*  --  1.63* 1.51* 1.33  CALCIUM 8.5 8.0*  --  8.3* 8.6 8.5  MG  --   --   --   --   --  2.2    Liver Function Tests:  Recent Labs Lab 02/20/14 1853 02/22/14 0342 02/22/14 1024  AST 14 34 14  ALT 14 12 14   ALKPHOS 67 53 58  BILITOT 1.7* 1.0 1.2  PROT 6.4 6.1 6.1  ALBUMIN 2.7* 2.4* 2.5*   CBC:  Recent Labs Lab 02/20/14 1853 02/21/14 0320 02/22/14 0342 02/23/14 0638  WBC 12.3* 7.6 6.7 6.4  NEUTROABS 9.9*  --   --   --   HGB 14.0 12.9* 12.9* 14.3  HCT 42.1 38.8* 38.4* 42.9  MCV 85.2 85.8 87.5 87.4  PLT 194 169 224 180   BNP (last 3 results)  Recent Labs  01/27/14 0929 02/03/14 0915 02/20/14 1853  PROBNP  6726.0* 3422.0* 3557.0*    Recent Results (from the past 240 hour(s))  CULTURE, BLOOD (ROUTINE X 2)     Status: None   Collection Time    02/20/14  6:53 PM      Result Value Ref Range Status   Specimen Description BLOOD RIGHT ANTECUBITAL   Final   Special Requests BOTTLES DRAWN AEROBIC AND ANAEROBIC 5CC EA   Final   Culture  Setup Time     Final   Value: 02/20/2014 23:35     Performed at [facility]   Culture     Final   Value: STAPHYLOCOCCUS SPECIES (COAGULASE NEGATIVE)     Note: THE SIGNIFICANCE OF ISOLATING THIS ORGANISM FROM A SINGLE SET OF BLOOD CULTURES WHEN MULTIPLE SETS ARE DRAWN IS UNCERTAIN. PLEASE NOTIFY THE MICROBIOLOGY DEPARTMENT WITHIN ONE WEEK IF SPECIATION AND SENSITIVITIES ARE REQUIRED.     Note: Gram Stain Report Called to,Read Back By and Verified With: STEWART WATERS @ 1231 ON [id number] BY [facility]     Performed at [facility]   Report Status 02/23/2014 FINAL   Final  CULTURE, BLOOD (ROUTINE X 2)     Status: None   Collection Time    02/20/14  7:03 PM      Result Value Ref Range Status   Specimen Description BLOOD RIGHT WRIST   Final   Special Requests BOTTLES DRAWN AEROBIC AND ANAEROBIC 5CC EA   Final   Culture  Setup Time     Final   Value: 02/20/2014 23:35     Performed at [facility]   Culture     Final   Value:        BLOOD CULTURE RECEIVED NO GROWTH TO DATE CULTURE WILL BE HELD FOR 5 DAYS BEFORE ISSUING A FINAL NEGATIVE REPORT     Performed at [facility]   Report Status PENDING   Incomplete  URINE CULTURE     Status: None   Collection Time    02/20/14  8:12 PM      Result Value Ref Range Status   Specimen Description URINE, CATHETERIZED   Final   Special Requests NONE   Final   Culture  Setup Time     Final   Value: 02/20/2014 20:33     Performed at [facility] Count     Final   Value: >=100,000 COLONIES/ML     Performed at [facility]   Culture     Final   Value: ENTEROCOCCUS SPECIES  [facility]   Report Status PENDING   Incomplete  MRSA PCR SCREENING     Status: Abnormal   Collection Time    02/20/14 11:34 PM      Result Value Ref Range Status   MRSA by PCR POSITIVE (*) NEGATIVE Final   Comment:            The GeneXpert MRSA Assay (FDA     approved for NASAL specimens     only), is one component of a     comprehensive MRSA colonization     surveillance program. It is not     intended to diagnose MRSA     infection nor to guide or     monitor treatment for     MRSA infections.     RESULT CALLED TO, READ BACK BY AND VERIFIED WITH:     SHAW,M RN 0134 02/21/14 MITCHELL,L     Studies:  Recent x-ray studies have been reviewed in detail by the Attending Physician  Scheduled Meds:  Scheduled Meds: . amiodarone  400 mg Oral Daily  . apixaban  5 mg Oral BID  . Chlorhexidine Gluconate Cloth  6 each Topical Q0600  . famotidine  20 mg Oral BID  . guaiFENesin  1,200 mg Oral BID  . loratadine  10 mg Oral Q breakfast  . magnesium chloride  64 mg Oral BID  . metoprolol  100 mg Oral BID  . mometasone-formoterol  2 puff Inhalation BID  . mupirocin ointment  1 application Nasal BID  . omega-3 acid ethyl esters  1 g Oral Daily  . piperacillin-tazobactam (ZOSYN)  IV  3.375 g Intravenous 3 times per day  . predniSONE  5 mg Oral Q breakfast  . vancomycin  1,250 mg Intravenous Q24H  . vitamin E  1,000 Units Oral Q breakfast    Time spent on care of this patient: 35 mins   Marlin CanaryVANN, Necole Minassian , DO   [facility] [phone]  On-Call/Text Page:      [email or web address] [email or web address]      password [email or web address]  If 7PM-7AM, please contact night-coverage [email or web address] Password [email or web address] 02/23/2014, 10:56 AM   LOS: 3 days

## 2014-02-24 DIAGNOSIS — I5023 Acute on chronic systolic (congestive) heart failure: Secondary | ICD-10-CM

## 2014-02-24 MED ORDER — FUROSEMIDE 40 MG PO TABS
40.0000 mg | ORAL_TABLET | Freq: Every day | ORAL | Status: DC
Start: 2014-02-24 — End: 2014-02-26
  Administered 2014-02-24 – 2014-02-26 (×3): 40 mg via ORAL
  Filled 2014-02-24 (×3): qty 1

## 2014-02-24 MED ORDER — LEVOFLOXACIN 500 MG PO TABS
500.0000 mg | ORAL_TABLET | Freq: Every day | ORAL | Status: AC
  Administered 2014-02-24 – 2014-02-26 (×3): 500 mg via ORAL
  Filled 2014-02-24 (×3): qty 1

## 2014-02-24 NOTE — Progress Notes (Addendum)
Progress Note  Richard Chavez ZOX:[id number] DOB: Jan 27, 1927 DOA: 02/20/2014 PCP: Thayer Headings, MD  Admit HPI / Brief Narrative: 80 y.o. male who was brought to the ED due to 3 days with increased weakness and inability to walk along with poor intake. He had cough and congestion. He was recently hospitalized for CAP, and also had a cardioversion on 02/05/2014.   In the ED he was found to have a LUL infiltrate on Chest X-ray and was placed on IV Vancomycin and Zosyn.  HPI/Subjective: Still not feeling better  Assessment/Plan:  LUL HCAP w/ sepsis  tx for RUL CAP w/ d/c 10/3 - CXR now notes ?LUL infiltrate - cont empiric abx tx - afeb - WBC has quickly normalized  - will complete short course of abx and follow - repeat CXR suggests no change in B UL infiltrates  -cultures negative -change to PO levaquin  Hypotension due to hypovolemia / DH Resolved w/ volume expansion   Enterococcus UTI v/s colonization  UA was not c/w UTI, but culture has noted >100k Enterococcus - S to quinolones  -has indwelling foley from acute urinary retention follows up with urology in 1 week  Hyponatremia  Likely due to volume depletion  -resolved  CAD s/p CABG Asymptomatic at present   Chronic systolic CHF - Ischemic cardiomyopathy w/ EF 20-25% Sept 2015 Pt was actually dehydrated at presentation, hydrated -will resume Po lasix today   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  CKD Stage II crt 1.15 9/30 at time of d/c - crt improving w/ hydration - follow   Parox Afib  S/p cardioversion/overdrive pacing 10/2 - continue Eliquis and Amiodarone Rx - RRR at present   HLD Resume tx once oral intake more consistent   COPD Well compensates at present   Hx of R MCA CVA -continue eliquis  MRSA screen +  Code Status: FULL Family Communication: spoke w/ son via telephone  Disposition Plan:  Home with son in 1-2days   Consultants: none  Procedures: none  Antibiotics: Zosyn 10/17 > Vanc 10/17  >  DVT prophylaxis: eliquis   Objective: Blood pressure 146/76, pulse 78, temperature 98.1 F (36.7 C), temperature source Oral, resp. rate 16, height 6' (1.829 m), weight 103.783 kg (228 lb 12.8 oz), SpO2 98.00%.  Intake/Output Summary (Last 24 hours) at 02/24/14 1641 Last data filed at 02/24/14 1230  Gross per 24 hour  Intake    120 ml  Output   1100 ml  Net   -980 ml    Exam: General: pleasant/cooperative, intermittently confused Lungs: faint basilar crackles Cardiovascular: Regular rate and rhythm without murmur gallop or rub  Abdomen: Nontender, nondistended, soft, bowel sounds positive, no rebound, no ascites, no appreciable mass Extremities: No significant cyanosis, clubbing, or edema bilateral lower extremities  Data Reviewed: Basic Metabolic Panel:  Recent Labs Lab 02/20/14 1853 02/21/14 0320 02/21/14 8119 02/22/14 0342 02/22/14 1024 02/23/14 0638  NA 126* 132*  --  133* 136* 135*  K 4.9 5.9* 4.6 5.6* 4.7 4.7  CL 93* 98  --  99 100 99  CO2 21 22  --  22 22 23   GLUCOSE 100* 81  --  94 93 70  BUN 27* 25*  --  18 17 14   CREATININE 1.85* 1.72*  --  1.63* 1.51* 1.33  CALCIUM 8.5 8.0*  --  8.3* 8.6 8.5  MG  --   --   --   --   --  2.2    Liver Function Tests:  Recent Labs Lab 02/20/14 1853 02/22/14 0342 02/22/14 1024  AST 14 34 14  ALT 14 12 14   ALKPHOS 67 53 58  BILITOT 1.7* 1.0 1.2  PROT 6.4 6.1 6.1  ALBUMIN 2.7* 2.4* 2.5*   CBC:  Recent Labs Lab 02/20/14 1853 02/21/14 0320 02/22/14 0342 02/23/14 0638  WBC 12.3* 7.6 6.7 6.4  NEUTROABS 9.9*  --   --   --   HGB 14.0 12.9* 12.9* 14.3  HCT 42.1 38.8* 38.4* 42.9  MCV 85.2 85.8 87.5 87.4  PLT 194 169 224 180   BNP (last 3 results)  Recent Labs  01/27/14 0929 02/03/14 0915 02/20/14 1853  PROBNP 6726.0* 3422.0* 3557.0*    Recent Results (from the past 240 hour(s))  CULTURE, BLOOD (ROUTINE X 2)     Status: None   Collection Time    02/20/14  6:53 PM      Result Value Ref Range  Status   Specimen Description BLOOD RIGHT ANTECUBITAL   Final   Special Requests BOTTLES DRAWN AEROBIC AND ANAEROBIC 5CC EA   Final   Culture  Setup Time     Final   Value: 02/20/2014 23:35     Performed at [facility]   Culture     Final   Value: STAPHYLOCOCCUS SPECIES (COAGULASE NEGATIVE)     Note: THE SIGNIFICANCE OF ISOLATING THIS ORGANISM FROM A SINGLE SET OF BLOOD CULTURES WHEN MULTIPLE SETS ARE DRAWN IS UNCERTAIN. PLEASE NOTIFY THE MICROBIOLOGY DEPARTMENT WITHIN ONE WEEK IF SPECIATION AND SENSITIVITIES ARE REQUIRED.     Note: Gram Stain Report Called to,Read Back By and Verified With: STEWART WATERS @ 1231 ON [id number] BY [facility]     Performed at [facility]   Report Status 02/23/2014 FINAL   Final  CULTURE, BLOOD (ROUTINE X 2)     Status: None   Collection Time    02/20/14  7:03 PM      Result Value Ref Range Status   Specimen Description BLOOD RIGHT WRIST   Final   Special Requests BOTTLES DRAWN AEROBIC AND ANAEROBIC 5CC EA   Final   Culture  Setup Time     Final   Value: 02/20/2014 23:35     Performed at [facility]   Culture     Final   Value:        BLOOD CULTURE RECEIVED NO GROWTH TO DATE CULTURE WILL BE HELD FOR 5 DAYS BEFORE ISSUING A FINAL NEGATIVE REPORT     Performed at [facility]   Report Status PENDING   Incomplete  URINE CULTURE     Status: None   Collection Time    02/20/14  8:12 PM      Result Value Ref Range Status   Specimen Description URINE, CATHETERIZED   Final   Special Requests NONE   Final   Culture  Setup Time     Final   Value: 02/20/2014 20:33     Performed at [facility] Count     Final   Value: >=100,000 COLONIES/ML     Performed at [facility]   Culture     Final   Value: ENTEROCOCCUS SPECIES     Performed at [facility]   Report Status 02/23/2014 FINAL   Final   Organism ID, Bacteria ENTEROCOCCUS SPECIES   Final  MRSA PCR SCREENING     Status: Abnormal   Collection Time     02/20/14 11:34 PM  Result Value Ref Range Status   MRSA by PCR POSITIVE (*) NEGATIVE Final   Comment:            The GeneXpert MRSA Assay (FDA     approved for NASAL specimens     only), is one component of a     comprehensive MRSA colonization     surveillance program. It is not     intended to diagnose MRSA     infection nor to guide or     monitor treatment for     MRSA infections.     RESULT CALLED TO, READ BACK BY AND VERIFIED WITH:     SHAW,M RN 0134 02/21/14 MITCHELL,L     Studies:  Recent x-ray studies have been reviewed in detail by the Attending Physician  Scheduled Meds:  Scheduled Meds: . amiodarone  400 mg Oral Daily  . apixaban  5 mg Oral BID  . Chlorhexidine Gluconate Cloth  6 each Topical Q0600  . famotidine  20 mg Oral BID  . furosemide  40 mg Oral Daily  . guaiFENesin  1,200 mg Oral BID  . levofloxacin  500 mg Oral Daily  . loratadine  10 mg Oral Q breakfast  . magnesium chloride  64 mg Oral BID  . metoprolol  100 mg Oral BID  . mometasone-formoterol  2 puff Inhalation BID  . mupirocin ointment  1 application Nasal BID  . omega-3 acid ethyl esters  1 g Oral Daily  . predniSONE  5 mg Oral Q breakfast  . vitamin E  1,000 Units Oral Q breakfast    Time spent on care of this patient: 35 mins   Zannie CoveJOSEPH,Sade Mehlhoff , MD  [facility] [phone]  On-Call/Text Page:      Loretha [email or web address]      password [email or web address]  If 7PM-7AM, please contact night-coverage [email or web address] Password [email or web address] 02/24/2014, 4:41 PM   LOS: 4 days

## 2014-02-24 NOTE — Evaluation (Signed)
Occupational Therapy Evaluation Patient Details Name: Richard Chavez MRN: [id number] DOB: May 09, 1926 Today's Date: 02/24/2014    History of Present Illness Adm 02/20/14 with AMS, weakness and found to have pneumonia, sepsis, and UTI. (Pt had recent admission 09/15 for pneumonia). PMHx- Rt MCA CVA, CABG, CHF with EF 20-25%, afib, and pacemaker   Clinical Impression   Pt is a poor historian, PLOF gained from chart.  Pt lives with his son who assists him with ADL at the sink and with meal prep and housekeeping. Since hospitalization last month, he was also providing minimal assistance with ambulation. Pt presents with impaired cognition, decreased balance and generalized weakness interfering with ability to participate maximally with self care and ADL transfers.  Will follow acutely. No family available, SNF would be the optimal d/c plan for short term rehab.  Pt declined this last hospitalization.    Follow Up Recommendations  Home health OT;Supervision/Assistance - 24 hour (Pt could benefit from rehab in SNF)    Equipment Recommendations  None recommended by OT    Recommendations for Other Services       Precautions / Restrictions Precautions Precautions: Fall Restrictions Weight Bearing Restrictions: No      Mobility Bed Mobility Overal bed mobility: Needs Assistance Bed Mobility: Supine to Sit     Supine to sit: Supervision;HOB elevated     General bed mobility comments: HOB up with rail and extra time and effort  Transfers Overall transfer level: Needs assistance Equipment used: Rolling walker (2 wheeled) Transfers: Sit to/from [employer] Sit to Stand: Min assist Stand pivot transfers: Min assist       General transfer comment: bed at lowest height, verbal cues for technique    Balance Overall balance assessment: Needs assistance Sitting-balance support: Feet supported Sitting balance-Leahy Scale: Fair     Standing balance support: Bilateral  upper extremity supported Standing balance-Leahy Scale: Poor                              ADL Overall ADL's : Needs assistance/impaired Eating/Feeding: Set up;Sitting   Grooming: Supervision/safety;Wash/dry hands;Wash/dry face;Brushing hair;Sitting   Upper Body Bathing: Minimal assitance;Sitting   Lower Body Bathing: Maximal assistance;Sit to/from stand   Upper Body Dressing : Minimal assistance;Sitting   Lower Body Dressing: Maximal assistance;Sit to/from stand   Toilet Transfer: Minimal assistance;Stand-pivot;RW (to recliner)           Functional mobility during ADLs: Minimal assistance;Rolling walker (LOB backward x 2, min assist to right) General ADL Comments: Pt not able to figure out how to pull his socks up.     Vision                 Additional Comments: reports no change, could not initially find TV screen   Perception     Praxis      Pertinent Vitals/Pain Pain Assessment: No/denies pain     Hand Dominance Left   Extremity/Trunk Assessment Upper Extremity Assessment Upper Extremity Assessment: RUE deficits/detail;LUE deficits/detail RUE Deficits / Details: generalized weakness LUE Deficits / Details: baseline weakness and incoordination from prior CVA LUE Coordination: decreased fine motor;decreased gross motor   Lower Extremity Assessment Lower Extremity Assessment: Defer to PT evaluation   Cervical / Trunk Assessment Cervical / Trunk Assessment: Normal   Communication Communication Communication: No difficulties   Cognition Arousal/Alertness: Awake/alert Behavior During Therapy: WFL for tasks assessed/performed Overall Cognitive Status: No family/caregiver present to determine baseline cognitive functioning Area  of Impairment: Orientation;Attention;Memory;Safety/judgement;Awareness Orientation Level: Time;Situation Current Attention Level: Sustained Memory: Decreased short-term memory Following Commands: Follows one step  commands inconsistently Safety/Judgement: Decreased awareness of safety;Decreased awareness of deficits Awareness: Intellectual Problem Solving: Slow processing;Decreased initiation;Difficulty sequencing;Requires verbal cues;Requires tactile cues General Comments: difficulty figuring out how to stand up from bed, poor historian   General Comments       Exercises       Shoulder Instructions      Home Living Family/patient expects to be discharged to:: Private residence Living Arrangements: Children Available Help at Discharge: Family;Available 24 hours/day Type of Home: House Home Access: Stairs to enter [employer] CorporationEntrance Stairs-Number of Steps: 2 Entrance Stairs-Rails: None Home Layout: One level     Bathroom Shower/Tub: Chief Strategy OfficerTub/shower unit   Bathroom Toilet: Standard     Home Equipment: Grab bars - tub/shower;Walker - 2 wheels;Cane - single point;Shower seat;Bedside commode;Wheelchair - manual          Prior Functioning/Environment Level of Independence: Needs assistance  Gait / Transfers Assistance Needed: sit to stand min assist; supervision with rw; min assist on steps ADL's / Homemaking Assistance Needed: assist with bath at sink and dressing   Comments: prior to 09/15 hospitalization pt ambulated with no device independently (including up/down stairs)    OT Diagnosis: Generalized weakness;Cognitive deficits;Altered mental status;Hemiplegia dominant side   OT Problem List: Decreased strength;Decreased activity tolerance;Impaired balance (sitting and/or standing);Decreased coordination;Decreased cognition;Decreased safety awareness;Decreased knowledge of use of DME or AE;Impaired UE functional use   OT Treatment/Interventions: Self-care/ADL training;Balance training;Patient/family education;DME and/or AE instruction;Therapeutic activities;Cognitive remediation/compensation    OT Goals(Current goals can be found in the care plan section) Acute Rehab OT Goals Patient Stated  Goal: to feel better OT Goal Formulation: With patient Time For Goal Achievement: 03/10/14 Potential to Achieve Goals: Good ADL Goals Pt Will Perform Grooming: with min guard assist;standing Pt Will Perform Lower Body Bathing: with min assist;sit to/from stand Pt Will Perform Lower Body Dressing: with min assist;sit to/from stand Pt Will Transfer to Toilet: with min guard assist;ambulating Pt Will Perform Toileting - Clothing Manipulation and hygiene: with min guard assist;sit to/from stand Additional ADL Goal #1: Pt will follow one step commands 8/10 times.  OT Frequency: Min 2X/week   Barriers to D/C:            Co-evaluation              End of Session Equipment Utilized During Treatment: Gait belt;Rolling walker  Activity Tolerance: Patient tolerated treatment well Patient left: in chair;with call bell/phone within reach;with chair alarm set   Time: [id number] OT Time Calculation (min): 27 min Charges:  OT General Charges $OT Visit: 1 Procedure OT Evaluation $Initial OT Evaluation Tier I: 1 Procedure OT Treatments $Self Care/Home Management : 8-22 mins G-Codes:    Evern BioMayberry, Basya Casavant Lynn 02/24/2014, 11:12 AM [phone]

## 2014-02-25 DIAGNOSIS — N182 Chronic kidney disease, stage 2 (mild): Secondary | ICD-10-CM

## 2014-02-25 LAB — BASIC METABOLIC PANEL
Anion gap: 16 — ABNORMAL HIGH (ref 5–15)
BUN: 15 mg/dL (ref 6–23)
BUN: 16 mg/dL (ref 6–23)
CHLORIDE: 97 meq/L (ref 96–112)
CHLORIDE: 97 meq/L (ref 96–112)
CO2: 19 meq/L (ref 19–32)
CREATININE: 1.51 mg/dL — AB (ref 0.50–1.35)
CREATININE: 1.32 mg/dL (ref 0.50–1.35)
Calcium: 8.1 mg/dL — ABNORMAL LOW (ref 8.4–10.5)
Calcium: 8 mg/dL — ABNORMAL LOW (ref 8.4–10.5)
GFR calc Af Amer: 47 mL/min — ABNORMAL LOW (ref >90)
GFR calc Af Amer: 55 mL/min — ABNORMAL LOW (ref >90)
GFR calc non Af Amer: 40 mL/min — ABNORMAL LOW (ref >90)
GFR calc non Af Amer: 47 mL/min — ABNORMAL LOW (ref >90)
GLUCOSE: 63 mg/dL — AB (ref 70–99)
Glucose, Bld: 60 mg/dL — ABNORMAL LOW (ref 70–99)
POTASSIUM: 4.8 meq/L (ref 3.7–5.3)
SODIUM: 134 meq/L — AB (ref 137–147)
Sodium: 132 mEq/L — ABNORMAL LOW (ref 137–147)

## 2014-02-25 LAB — GLUCOSE, CAPILLARY: Glucose-Capillary: 92 mg/dL (ref 70–99)

## 2014-02-25 NOTE — Progress Notes (Signed)
Physical Therapy Treatment Patient Details Name: Richard Chavez MRN: [id number] DOB: Apr 21, 1927 Today's Date: 02/25/2014    History of Present Illness Adm 02/20/14 with AMS, weakness and found to have pneumonia, sepsis, and UTI. (Pt had recent admission 09/15 for pneumonia). PMHx- Rt MCA CVA, CABG, CHF with EF 20-25%, afib, and pacemaker    PT Comments    Pt continues to demonstrate AMS and generalized weakness. Son was present during session and states that he feels comfortable managing pt at home at d/c. Will continue to plan for HHPT to follow up. Will attempt to see pt again prior to d/c for further gait training, as son states pt may go home this weekend (2 days from today). Will continue to follow.  Follow Up Recommendations  Home health PT;Supervision/Assistance - 24 hour     Equipment Recommendations  None recommended by PT    Recommendations for Other Services       Precautions / Restrictions Precautions Precautions: Fall Restrictions Weight Bearing Restrictions: No    Mobility  Bed Mobility Overal bed mobility: Needs Assistance Bed Mobility: Rolling;Sidelying to Sit Rolling: Min assist Sidelying to sit: Mod assist       General bed mobility comments: Pt was very lethargic and required increased cues to facilitate movement. Pt was able to sit EOB with assist for trunk elevation and LE movement to EOB. Pt seemed to have difficulty understanding purpose of movement, and would often put feet back up on bed after therapist assisted them off bed.   Transfers Overall transfer level: Needs assistance Equipment used: 2 person hand held assist Transfers: Sit to/from [employer] Sit to Stand: Min assist;+2 safety/equipment Stand pivot transfers: Min assist       General transfer comment: Pt with difficulty initiating transfer to standing. He was cued for hand placement on seated surface for safety, however was reaching to pull up from therapist. Once  transfer was initiated, pt powered-up to full stand with ease. Assist for safety during SPT to chair.   Ambulation/Gait             General Gait Details: Deferred for safety as pt very sleepy and lethargic.    Stairs            Wheelchair Mobility    Modified Rankin (Stroke Patients Only)       Balance Overall balance assessment: Needs assistance Sitting-balance support: Feet supported;No upper extremity supported Sitting balance-Leahy Scale: Fair     Standing balance support: Bilateral upper extremity supported;During functional activity Standing balance-Leahy Scale: Poor Standing balance comment: Pt requires UE support to maintain standing balance.                     Cognition Arousal/Alertness: Lethargic Behavior During Therapy: WFL for tasks assessed/performed Overall Cognitive Status: Impaired/Different from baseline Area of Impairment: Orientation;Attention;Memory;Following commands;Safety/judgement;Awareness;Problem solving                    Exercises      General Comments        Pertinent Vitals/Pain Pain Assessment: No/denies pain    Home Living                      Prior Function            PT Goals (current goals can now be found in the care plan section) Acute Rehab PT Goals Patient Stated Goal: to feel better PT Goal Formulation: With patient/family Time For Goal  Achievement: 03/09/14 Potential to Achieve Goals: Fair Progress towards PT goals: Progressing toward goals    Frequency  Min 3X/week    PT Plan Current plan remains appropriate    Co-evaluation             End of Session Equipment Utilized During Treatment: Gait belt Activity Tolerance: Patient limited by lethargy Patient left: in chair;with call bell/phone within reach;with chair alarm set;with family/visitor present     Time: [id number] PT Time Calculation (min): 26 min  Charges:  $Gait Training: 8-22 mins $Therapeutic Activity:  8-22 mins                    G Codes:      Conni SlipperKirkman, Mischele Detter 02/25/2014, 5:32 PM  Conni SlipperLaura Ladon Heney, PT, DPT Acute Rehabilitation Services Pager: [phone]

## 2014-02-25 NOTE — Progress Notes (Signed)
Progress Note  Richard Chavez VCB:[id number] DOB: Jul 28, 1926 DOA: 02/20/2014 PCP: Thayer Headings, MD  Admit HPI / Brief Narrative: 80 y.o. male who was brought to the ED due to 3 days with increased weakness and inability to walk along with poor intake. He had cough and congestion. He was recently hospitalized for CAP, and also had a cardioversion on 02/05/2014.   In the ED he was found to have a LUL infiltrate on Chest X-ray and was placed on IV Vancomycin and Zosyn.  HPI/Subjective: Still not feeling better  Assessment/Plan:  LUL HCAP w/ sepsis  -tx for RUL CAP w/ d/c 10/3 - CXR now notes ?LUL infiltrate - cont empiric abx tx - afeb - WBC has quickly normalized  - will complete short course of abx and follow - repeat CXR suggests no change in B UL infiltrates  -cultures negative -changed to PO levaquin 10/21, will continue this for 2 more days  Hypotension due to hypovolemia / DH Resolved w/ volume expansion   Enterococcus UTI v/s colonization  -UA was not c/w UTI, but culture has noted >100k Enterococcus - S to quinolones  -has indwelling foley from acute urinary retention follows up with urology in 1 week  Confusion -suspect Early dementia/cognitive dysfunction with component of metabolic encephalopathy  Hyponatremia  -Likely due to volume depletion  -resolved  CAD s/p CABG -Asymptomatic at present   Chronic systolic CHF - Ischemic cardiomyopathy w/ EF 20-25% Sept 2015 -Pt was actually dehydrated at presentation, hydrated -Resumed Po lasix today   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 rate 18, height 6' (1.829 m), weight 104.45 kg (230 lb 4.3 oz), SpO2 95.00%.  Intake/Output Summary (Last 24 hours) at 02/25/14 1336 Last data filed at 02/25/14 0429  Gross per 24 hour  Intake      0 ml  Output   1800 ml  Net  -1800 ml    Exam: General: pleasant/cooperative, intermittently confused Lungs: faint basilar crackles Cardiovascular: Regular rate and rhythm without murmur gallop or rub  Abdomen: Nontender, nondistended, soft, bowel sounds positive, no rebound, no ascites, no appreciable mass Extremities: No significant cyanosis, clubbing, or edema bilateral lower extremities  Data Reviewed: Basic Metabolic Panel:  Recent Labs Lab 02/22/14 0342 02/22/14 1024 02/23/14 0638 02/25/14 0749 02/25/14 1143  NA 133* 136* 135* 134* 132*  K 5.6* 4.7 4.7 HEMOLYSIS AT THIS LEVEL MAY AFFECT RESULT 4.8  CL 99 100 99 97 97  CO2 22 22 23  HEMOLYSIS AT THIS LEVEL MAY AFFECT RESULT 19  GLUCOSE 94 93 70 60* 63*  BUN 18 17 14 15 16   CREATININE 1.63* 1.51* 1.33 1.51* 1.32  CALCIUM 8.3* 8.6 8.5 8.1* 8.0*  MG  --   --  2.2  --   --     Liver Function Tests:  Recent Labs Lab 02/20/14 1853 02/22/14 0342 02/22/14 1024  AST  14 34 14  ALT 14 12 14   ALKPHOS 67 53 58  BILITOT 1.7* 1.0 1.2  PROT 6.4 6.1 6.1  ALBUMIN 2.7* 2.4* 2.5*   CBC:  Recent Labs Lab 02/20/14 1853 02/21/14 0320 02/22/14 0342 02/23/14 0638  WBC 12.3* 7.6 6.7 6.4  NEUTROABS 9.9*  --   --   --   HGB 14.0 12.9* 12.9* 14.3  HCT 42.1 38.8* 38.4* 42.9  MCV 85.2 85.8 87.5 87.4  PLT 194 169 224 180   BNP (last 3 results)  Recent Labs  01/27/14 0929 02/03/14 0915 02/20/14 1853  PROBNP 6726.0* 3422.0* 3557.0*    Recent Results (from the past 240 hour(s))  CULTURE, BLOOD  (ROUTINE X 2)     Status: None   Collection Time    02/20/14  6:53 PM      Result Value Ref Range Status   Specimen Description BLOOD RIGHT ANTECUBITAL   Final   Special Requests BOTTLES DRAWN AEROBIC AND ANAEROBIC 5CC EA   Final   Culture  Setup Time     Final   Value: 02/20/2014 23:35     Performed at [facility]   Culture     Final   Value: STAPHYLOCOCCUS SPECIES (COAGULASE NEGATIVE)     Note: THE SIGNIFICANCE OF ISOLATING THIS ORGANISM FROM A SINGLE SET OF BLOOD CULTURES WHEN MULTIPLE SETS ARE DRAWN IS UNCERTAIN. PLEASE NOTIFY THE MICROBIOLOGY DEPARTMENT WITHIN ONE WEEK IF SPECIATION AND SENSITIVITIES ARE REQUIRED.     Note: Gram Stain Report Called to,Read Back By and Verified With: STEWART WATERS @ 1231 ON [id number] BY [facility]     Performed at [facility]   Report Status 02/23/2014 FINAL   Final  CULTURE, BLOOD (ROUTINE X 2)     Status: None   Collection Time    02/20/14  7:03 PM      Result Value Ref Range Status   Specimen Description BLOOD RIGHT WRIST   Final   Special Requests BOTTLES DRAWN AEROBIC AND ANAEROBIC 5CC EA   Final   Culture  Setup Time     Final   Value: 02/20/2014 23:35     Performed at [facility]   Culture     Final   Value:        BLOOD CULTURE RECEIVED NO GROWTH TO DATE CULTURE WILL BE HELD FOR 5 DAYS BEFORE ISSUING A FINAL NEGATIVE REPORT     Performed at [facility]   Report Status PENDING   Incomplete  URINE CULTURE     Status: None   Collection Time    02/20/14  8:12 PM      Result Value Ref Range Status   Specimen Description URINE, CATHETERIZED   Final   Special Requests NONE   Final   Culture  Setup Time     Final   Value: 02/20/2014 20:33     Performed at [facility] Count     Final   Value: >=100,000 COLONIES/ML     Performed at [facility]   Culture     Final   Value: ENTEROCOCCUS SPECIES     Performed at [facility]   Report Status 02/23/2014 FINAL   Final   Organism  ID, Bacteria ENTEROCOCCUS SPECIES   Final  MRSA PCR SCREENING     Status: Abnormal   Collection Time    02/20/14 11:34 PM      Result Value Ref Range Status  MRSA by PCR POSITIVE (*) NEGATIVE Final   Comment:            The GeneXpert MRSA Assay (FDA     approved for NASAL specimens     only), is one component of a     comprehensive MRSA colonization     surveillance program. It is not     intended to diagnose MRSA     infection nor to guide or     monitor treatment for     MRSA infections.     RESULT CALLED TO, READ BACK BY AND VERIFIED WITH:     SHAW,M RN 0134 02/21/14 MITCHELL,L     Studies:  Recent x-ray studies have been reviewed in detail by the Attending Physician  Scheduled Meds:  Scheduled Meds: . amiodarone  400 mg Oral Daily  . apixaban  5 mg Oral BID  . famotidine  20 mg Oral BID  . furosemide  40 mg Oral Daily  . guaiFENesin  1,200 mg Oral BID  . levofloxacin  500 mg Oral Daily  . loratadine  10 mg Oral Q breakfast  . magnesium chloride  64 mg Oral BID  . metoprolol  100 mg Oral BID  . mometasone-formoterol  2 puff Inhalation BID  . mupirocin ointment  1 application Nasal BID  . omega-3 acid ethyl esters  1 g Oral Daily  . predniSONE  5 mg Oral Q breakfast  . vitamin E  1,000 Units Oral Q breakfast    Time spent on care of this patient: 35 mins   Zannie CoveJOSEPH,Rachyl Wuebker , MD  [facility] [phone]  On-Call/Text Page:      [email or web address]      password [email or web address]  If 7PM-7AM, please contact night-coverage [email or web address] Password [email or web address] 02/25/2014, 1:36 PM   LOS: 5 days

## 2014-02-26 DIAGNOSIS — I255 Ischemic cardiomyopathy: Secondary | ICD-10-CM

## 2014-02-26 LAB — CULTURE, BLOOD (ROUTINE X 2): Culture: NO GROWTH

## 2014-02-26 NOTE — Progress Notes (Signed)
Occupational Therapy Treatment Patient Details Name: Richard ShuttersFred M Chavez MRN: [id number] DOB: 03-06-1927 Today's Date: 02/26/2014    History of present illness Adm 02/20/14 with AMS, weakness and found to have pneumonia, sepsis, and UTI. (Pt had recent admission 09/15 for pneumonia). PMHx- Rt MCA CVA, CABG, CHF with EF 20-25%, afib, and pacemaker   OT comments  Pt. And son declined OOB or EOB activity today.  Educated and demonstrated bed mobility techniques to assist pt. With upright position for self feeding. Son active in participation today.  Pt. With reported poor appetite, noted some difficulty with managing utensils for effective self feeding, pt. Became visibly frustrated and would not continue.  Son eager to feed the pt., but pt. Encouraged pt. To continue with assistance with positioning of plate and food to ease technique.  At this point, with limited mobility and initiation for participation snf may be option for continued therapy and strengthening to ensure safe return home and decrease caregiver burden to son  Follow Up Recommendations  Home health OT;Supervision/Assistance - 24 hour , possible SNF?   Equipment Recommendations  None recommended by OT    Recommendations for Other Services      Precautions / Restrictions Precautions Precautions: Fall Restrictions Weight Bearing Restrictions: No       Mobility Bed Mobility Overal bed mobility: Needs Assistance Bed Mobility: Rolling Rolling: Max assist         General bed mobility comments: pt. and son declined eob and oob, worked with pt. and son on bed mobility to promote seated position in bed, pt. able to bend b les, but failed to push during scoot to hob, pt.s son was active in learning the techniques for assisting pt. in bed  Transfers                 General transfer comment: pt. and son declined oob    Balance                                   ADL Overall ADL's : Needs  assistance/impaired Eating/Feeding: Set up;Minimal assistance;Sitting;Bed level Eating/Feeding Details (indicate cue type and reason): some initial set up required and also cutting and positioning of food items for pt. reach, pt. with notable difficulty using fork to pierce food and would give up after multiple attempts due to frustration, poor appetie and initiation of eating                                   General ADL Comments: son present for middle of session, and declined for pt. for oob, then pt. declined oob      Vision                     Perception     Praxis      Cognition                             Extremity/Trunk Assessment               Exercises     Shoulder Instructions       General Comments      Pertinent Vitals/ Pain       Pain Assessment: No/denies pain  Home Living  Prior Functioning/Environment              Frequency Min 2X/week     Progress Toward Goals  OT Goals(current goals can now be found in the care plan section)        Plan Discharge plan remains appropriate    Co-evaluation                 End of Session     Activity Tolerance Other (comment) (declined multiple options for skilled OT tx)   Patient Left in bed;with call bell/phone within reach   Nurse Communication          Time: 1020-1036 OT Time Calculation (min): 16 min  Charges: OT General Charges $OT Visit: 1 Procedure OT Treatments $Self Care/Home Management : 8-22 mins  Robet Leu, COTA/L 02/26/2014, 11:43 AM

## 2014-02-26 NOTE — Discharge Summary (Addendum)
Physician Discharge Summary  Richard Chavez BJY:[id number] DOB: 1927-04-23 DOA: 02/20/2014  PCP: Richard Headings, MD  Admit date: 02/20/2014 Discharge date: 02/26/2014  Time spent: 45 minutes  Recommendations for Outpatient Follow-up:  1. PCP Dr.MackEnzie in 1 week, needs ongoing discussion regarding Palliative Care/Hospice if continues to decline 2. Outpatient Urology FU for Voiding trial, has foley for 2 weeks now  Discharge Diagnoses:  Principal Problem:   HCAP (healthcare-associated pneumonia)   Suspected Dementia   Chronic combined systolic and diastolic congestive heart failure   Chronic kidney disease, stage III (moderate)   Sepsis   Hypotension   Paroxysmal a-fib   Long-term (current) use of anticoagulants   Hyponatremia   H/o CVA  Discharge Condition: stable  Diet recommendation: low sodium  Filed Weights   02/20/14 1943 02/23/14 2007 02/24/14 2117  Weight: 100.8 kg (222 lb 3.6 oz) 103.783 kg (228 lb 12.8 oz) 104.45 kg (230 lb 4.3 oz)    History of present illness:  Chief Complaint: Weakness  HPI: Richard Chavez is a 80 y.o. male with Multiple Medical Problems who was brought to the Ed due to decline over the past 3 days with increased weakness and inability to walk along with poor intake of foods and liquids. He has had cough and congestion. His son is at the bedside and also reports that he was confused today. The son called [facility] and was advised to take him to the ED. He was recently hospitalized for CAP, and also had a cardioversion on 02/05/2014. He was found to have a LUL Pneumonia on Chest X-ray    Hospital Course:  LUL HCAP w/ sepsis  -tx for RUL CAP w/ d/c 10/3 - CXR now notes LUL infiltrate  - treated with empiric Vanc and Cefepime and then transitioned to PO levaquin - WBC has quickly normalized, cultures negative  - completed 7days of ABx with todays dose  Hypotension due to hypovolemia / DH  Resolved w/ volume expansion   Enterococcus UTI  v/s colonization  -UA was not c/w UTI, but culture has noted >100k Enterococcus - S to quinolones  -has indwelling foley from acute urinary retention follows up with urology in 1 week   Confusion  -suspect Early dementia/cognitive dysfunction with component of metabolic encephalopathy  -given recurrence favor Dementia, cognitive and functional decline noted over   Hyponatremia  -Likely due to volume depletion  -resolved   CAD s/p CABG  -Asymptomatic at present   Chronic systolic CHF - Ischemic cardiomyopathy w/ EF 20-25% Sept 2015  -Pt was actually dehydrated at presentation, hydrated  -Then resumed Po lasix   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   CKD Stage II  crt 1.15 9/30 at time of d/c - crt improving w/ hydration - follow   Parox Afib  S/p cardioversion/overdrive pacing 10/2 - continue Eliquis and Amiodarone Rx - RRR at present   COPD  -stable   Hx of R MCA CVA  -continue eliquis  GLOBAL: Given multitude of medical problems namely CVA, CHF, CKD, Recurrent pneumonia, Cognitive decline, i discussed with son regarding need to consider Palliative./Hospice Option if decline continues, son seemed open to consider this option, would encourage PCP to continue this discussion too   Discharge Exam: Filed Vitals:   02/26/14 0909  BP: 123/79  Pulse: 75  Temp: 97.3 F (36.3 C)  Resp:     General: Alert, awake, oriented to self and occasionally to place Cardiovascular: S1S2/RRR Respiratory: CTAB  Discharge Instructions You were cared  for by a hospitalist during your hospital stay. If you have any questions about your discharge medications or the care you received while you were in the hospital after you are discharged, you can call the unit and asked to speak with the hospitalist on call if the hospitalist that took care of you is not available. Once you are discharged, your primary care physician will handle any further medical issues. Please note that NO REFILLS for any discharge  medications will be authorized once you are discharged, as it is imperative that you return to your primary care physician (or establish a relationship with a primary care physician if you do not have one) for your aftercare needs so that they can reassess your need for medications and monitor your lab values.  Discharge Instructions   Diet - low sodium heart healthy    Complete by:  As directed      Increase activity slowly    Complete by:  As directed           Current Discharge Medication List    CONTINUE these medications which have NOT CHANGED   Details  albuterol-ipratropium (COMBIVENT) 18-103 MCG/ACT inhaler Inhale 2 puffs into the lungs 2 (two) times daily.     amiodarone (PACERONE) 400 MG tablet Take 1 tablet (400 mg total) by mouth daily. Qty: 30 tablet, Refills: 0    apixaban (ELIQUIS) 5 MG TABS tablet Take 1 tablet (5 mg total) by mouth 2 (two) times daily. Qty: 60 tablet, Refills: 11    atorvastatin (LIPITOR) 20 MG tablet Take 20 mg by mouth daily with breakfast.     fluticasone (FLONASE) 50 MCG/ACT nasal spray Place 1 spray into both nostrils 2 (two) times daily as needed for allergies or rhinitis.    Fluticasone-Salmeterol (ADVAIR DISKUS) 500-50 MCG/DOSE AEPB Inhale 1 puff into the lungs every 12 (twelve) hours.     furosemide (LASIX) 40 MG tablet Take 1 tablet (40 mg total) by mouth daily. Qty: 30 tablet, Refills: 0    guaiFENesin (MUCINEX) 600 MG 12 hr tablet Take 1,200 mg by mouth 2 (two) times daily.    lisinopril (PRINIVIL,ZESTRIL) 2.5 MG tablet Take 0.5 tablets (1.25 mg total) by mouth daily. Qty: 30 tablet, Refills: 0    loratadine (CLARITIN) 10 MG tablet Take 10 mg by mouth daily with breakfast.     Magnesium Chloride (SLOW-MAG) 535 (64 MG) MG TBCR Take 64 mg by mouth 2 (two) times daily.     metoprolol (LOPRESSOR) 100 MG tablet Take 100 mg by mouth 2 (two) times daily.    Omega-3 Fatty Acids (FISH OIL) 1000 MG CAPS Take 1,000 mg by mouth daily.      potassium chloride (K-DUR,KLOR-CON) 10 MEQ tablet Take 10 mEq by mouth 2 (two) times daily.    potassium chloride 20 MEQ/15ML (10%) solution Take 15 mLs (20 mEq total) by mouth daily. Qty: 500 mL, Refills: 0    predniSONE (DELTASONE) 5 MG tablet Take 5 mg by mouth daily.    ranitidine (ZANTAC) 150 MG capsule Take 150 mg by mouth 2 (two) times daily.     tiotropium (SPIRIVA) 18 MCG inhalation capsule Place 18 mcg into inhaler and inhale daily with breakfast.     Vitamin D, Ergocalciferol, (DRISDOL) 50000 UNITS CAPS Take 50,000 Units by mouth every 7 (seven) days. Take on Fridays    vitamin E 1000 UNIT capsule Take 1,000 Units by mouth daily with breakfast.        No Known Allergies  Follow-up Information   Follow up with Richard Headings, MD. Schedule an appointment as soon as possible for a visit in 1 week.   Specialty:  Internal Medicine   Contact information:   [address] [place] [address] [phone]        The results of significant diagnostics from this hospitalization (including imaging, microbiology, ancillary and laboratory) are listed below for reference.    Significant Diagnostic Studies: Dg Chest 2 View  02/04/2014   CLINICAL DATA:  Cough.  Recently treated for pneumonia.  EXAM: CHEST  2 VIEW  COMPARISON:  01/31/2014.  FINDINGS: Stable enlarged cardiac silhouette, post CABG changes and right subclavian pacemaker leads. Airspace opacity in the right upper lobe with improvement. The remainder of the lungs are clear. The visualized bones are unremarkable.  IMPRESSION: Improving right upper lobe pneumonia.   Electronically Signed   By: Gordan Payment M.D.   On: 02/04/2014 14:14   Dg Chest 2 View  01/31/2014   CLINICAL DATA:  Shortness of breath and vascular congestion, followup, history hypertension, coronary artery disease post MI, chronic renal failure, COPD, GERD, ischemic cardiomyopathy  EXAM: CHEST  2 VIEW  COMPARISON:  01/28/2014  FINDINGS:  RIGHT subclavian sequential pacemaker leads project at RIGHT atrium and RIGHT ventricle.  Enlargement of cardiac silhouette post CABG.  Atherosclerotic calcification aortic arch.  Stable mediastinal contours.  Persistent consolidation RIGHT upper lobe.  Pulmonary vascular congestion accentuation of pulmonary markings throughout remainder of lungs appear stable.  No additional infiltrate, pleural effusion or pneumothorax.  IMPRESSION: Persistent RIGHT upper lobe consolidation consistent with pneumonia.  Enlargement of cardiac silhouette post pacemaker and CABG.   Electronically Signed   By: Ulyses Southward M.D.   On: 01/31/2014 08:27   Ct Head Wo Contrast  02/20/2014   CLINICAL DATA:  Altered mental status, initial evaluation  EXAM: CT HEAD WITHOUT CONTRAST  TECHNIQUE: Contiguous axial images were obtained from the base of the skull through the vertex without intravenous contrast.  COMPARISON:  10/02/2012  FINDINGS: Moderate diffuse atrophy. Moderate low attenuation in the deep white matter. Focal encephalomalacia right parietal lobe stable. No evidence of hemorrhage or extra-axial fluid. No evidence of acute vascular territory infarct. Calvarium is intact. No significant inflammatory change in the visualized portions of the sinuses.  IMPRESSION: Chronic involutional change and prior infarct on the right with no acute findings.   Electronically Signed   By: Esperanza Heir M.D.   On: 02/20/2014 20:20   Dg Chest Port 1 View  02/22/2014   CLINICAL DATA:  Pneumonia, shortness of breath, personal history of coronary artery disease post CABG, hypertension, COPD, chronic renal failure, ischemic cardiomyopathy, prior MI and stroke  EXAM: PORTABLE CHEST - 1 VIEW  COMPARISON:  Portable exam 0454 hr compared to 02/20/2014  FINDINGS: RIGHT subclavian sequential transvenous pacemaker leads project over RIGHT atrium and RIGHT ventricle, unchanged.  Enlargement of cardiac silhouette post CABG.  Stable mediastinal contours with  atherosclerotic calcification at aortic arch.  Patchy infiltrates in BILATERAL upper lobes again noted.  Underlying emphysematous changes.  Minimal bibasilar atelectasis.  No new infiltrate, pleural effusion or pneumothorax.  IMPRESSION: Enlargement of cardiac silhouette post CABG and pacemaker.  COPD changes with persistent upper lobe infiltrates bilaterally.   Electronically Signed   By: Ulyses Southward M.D.   On: 02/22/2014 07:49   Dg Chest Portable 1 View  02/20/2014   CLINICAL DATA:  Cough. Weakness. Altered mental status. Ex-smoker.  EXAM: PORTABLE CHEST - 1 VIEW  COMPARISON:  02/04/2014.  FINDINGS: Stable enlarged cardiac silhouette, post CABG changes and right subclavian pacemaker leads. Stable prominence of the interstitial markings. No pleural fluid. Further decrease in right upper lobe airspace opacity. Interval mild airspace opacity in the lateral aspect of the left mid to upper lung zone. Unremarkable bones.  IMPRESSION: 1. Interval mild pneumonia in the left mid to upper lung zone. 2. Further improvement in right upper lobe pneumonia. 3. Stable cardiomegaly and chronic interstitial lung disease.   Electronically Signed   By: Gordan PaymentSteve  Reid M.D.   On: 02/20/2014 19:41   Dg Chest Port 1 View  01/28/2014   CLINICAL DATA:  Evaluate pneumonia  EXAM: PORTABLE CHEST - 1 VIEW  COMPARISON:  01/27/2014  FINDINGS: Persistent right upper lobe pneumonia is identified although mild increase in aeration in the right upper lobe is noted. The lungs are otherwise within normal limits. Postsurgical changes are seen. Cardiac shadow is stable. No acute bony abnormality is noted. A pacing device is again seen.  IMPRESSION: Persistent but mildly improved right upper lobe pneumonia.   Electronically Signed   By: Alcide CleverMark  Lukens M.D.   On: 01/28/2014 07:05    Microbiology: Recent Results (from the past 240 hour(s))  CULTURE, BLOOD (ROUTINE X 2)     Status: None   Collection Time    02/20/14  6:53 PM      Result Value Ref  Range Status   Specimen Description BLOOD RIGHT ANTECUBITAL   Final   Special Requests BOTTLES DRAWN AEROBIC AND ANAEROBIC 5CC EA   Final   Culture  Setup Time     Final   Value: 02/20/2014 23:35     Performed at [facility]   Culture     Final   Value: STAPHYLOCOCCUS SPECIES (COAGULASE NEGATIVE)     Note: THE SIGNIFICANCE OF ISOLATING THIS ORGANISM FROM A SINGLE SET OF BLOOD CULTURES WHEN MULTIPLE SETS ARE DRAWN IS UNCERTAIN. PLEASE NOTIFY THE MICROBIOLOGY DEPARTMENT WITHIN ONE WEEK IF SPECIATION AND SENSITIVITIES ARE REQUIRED.     Note: Gram Stain Report Called to,Read Back By and Verified With: STEWART WATERS @ 1231 ON [id number] BY [facility]     Performed at [facility]   Report Status 02/23/2014 FINAL   Final  CULTURE, BLOOD (ROUTINE X 2)     Status: None   Collection Time    02/20/14  7:03 PM      Result Value Ref Range Status   Specimen Description BLOOD RIGHT WRIST   Final   Special Requests BOTTLES DRAWN AEROBIC AND ANAEROBIC 5CC EA   Final   Culture  Setup Time     Final   Value: 02/20/2014 23:35     Performed at [facility]   Culture     Final   Value: NO GROWTH 5 DAYS     Performed at [facility]   Report Status 02/26/2014 FINAL   Final  URINE CULTURE     Status: None   Collection Time    02/20/14  8:12 PM      Result Value Ref Range Status   Specimen Description URINE, CATHETERIZED   Final   Special Requests NONE   Final   Culture  Setup Time     Final   Value: 02/20/2014 20:33     Performed at [facility]   Colony Count     Final   Value: >=100,000 COLONIES/ML     Performed at [facility]   Culture  Final   Value: ENTEROCOCCUS SPECIES     Performed at [facility]   Report Status 02/23/2014 FINAL   Final   Organism ID, Bacteria ENTEROCOCCUS SPECIES   Final  MRSA PCR SCREENING     Status: Abnormal   Collection Time    02/20/14 11:34 PM      Result Value Ref Range Status   MRSA by PCR POSITIVE (*)  NEGATIVE Final   Comment:            The GeneXpert MRSA Assay (FDA     approved for NASAL specimens     only), is one component of a     comprehensive MRSA colonization     surveillance program. It is not     intended to diagnose MRSA     infection nor to guide or     monitor treatment for     MRSA infections.     RESULT CALLED TO, READ BACK BY AND VERIFIED WITH:     SHAW,M RN 0134 02/21/14 MITCHELL,L     Labs: Basic Metabolic Panel:  Recent Labs Lab 02/22/14 0342 02/22/14 1024 02/23/14 0638 02/25/14 0749 02/25/14 1143  NA 133* 136* 135* 134* 132*  K 5.6* 4.7 4.7 HEMOLYSIS AT THIS LEVEL MAY AFFECT RESULT 4.8  CL 99 100 99 97 97  CO2 22 22 23  HEMOLYSIS AT THIS LEVEL MAY AFFECT RESULT 19  GLUCOSE 94 93 70 60* 63*  BUN 18 17 14 15 16   CREATININE 1.63* 1.51* 1.33 1.51* 1.32  CALCIUM 8.3* 8.6 8.5 8.1* 8.0*  MG  --   --  2.2  --   --    Liver Function Tests:  Recent Labs Lab 02/20/14 1853 02/22/14 0342 02/22/14 1024  AST 14 34 14  ALT 14 12 14   ALKPHOS 67 53 58  BILITOT 1.7* 1.0 1.2  PROT 6.4 6.1 6.1  ALBUMIN 2.7* 2.4* 2.5*   No results found for this basename: LIPASE, AMYLASE,  in the last 168 hours No results found for this basename: AMMONIA,  in the last 168 hours CBC:  Recent Labs Lab 02/20/14 1853 02/21/14 0320 02/22/14 0342 02/23/14 0638  WBC 12.3* 7.6 6.7 6.4  NEUTROABS 9.9*  --   --   --   HGB 14.0 12.9* 12.9* 14.3  HCT 42.1 38.8* 38.4* 42.9  MCV 85.2 85.8 87.5 87.4  PLT 194 169 224 180   Cardiac Enzymes: No results found for this basename: CKTOTAL, CKMB, CKMBINDEX, TROPONINI,  in the last 168 hours BNP: BNP (last 3 results)  Recent Labs  01/27/14 0929 02/03/14 0915 02/20/14 1853  PROBNP 6726.0* 3422.0* 3557.0*   CBG:  Recent Labs Lab 02/25/14 2301  GLUCAP 92       Signed:  Hayato Guaman  [facility] 02/26/2014, 11:05 AM

## 2014-02-26 NOTE — Progress Notes (Signed)
Patient discharged home with son. Patient was discharged home with foley catheter in place. Discharge instructions and medications were reviewed with patients son. Patient was stable upon discharge.

## 2014-02-26 NOTE — Progress Notes (Signed)
CARE MANAGEMENT NOTE 02/26/2014  Patient:  Richard Chavez, Richard Chavez   Account Number:  [id number]  Date Initiated:  02/26/2014  Documentation initiated by:  Darlyne Russian  Subjective/Objective Assessment:   admitted with HCAP     Action/Plan:   active with [facility]  resume home health services   Anticipated DC Date:  02/26/2014   Anticipated DC Plan:  HOME W HOME HEALTH SERVICES      DC Planning Services  CM consult      [facility]  Resumption Of Svcs/PTA Provider   Choice offered to / List presented to:             Status of service:  Completed, signed off Medicare Important Message given?  YES (If response is "NO", the following Medicare IM given date fields will be blank) Date Medicare IM given:  02/26/2014 Medicare IM given by:  Darlyne Russian Date Additional Medicare IM given:   Additional Medicare IM given by:    Discharge Disposition:    Per UR Regulation:    If discussed at Long Length of Stay Meetings, dates discussed:    Comments:  02/26/2014 [address] RN, [place] [phone] Patient active with [facility].  [facility]/ Hilda Lias. NCM called to advise of discharge and resume home health.

## 2014-03-19 ENCOUNTER — Inpatient Hospital Stay (HOSPITAL_COMMUNITY)
Admission: EM | Admit: 2014-03-19 | Discharge: 2014-03-22 | DRG: 291 | Disposition: A | Discharge status: Skilled Nursing Facility | Payer: Medicare Other | Attending: Internal Medicine | Admitting: Internal Medicine

## 2014-03-19 ENCOUNTER — Emergency Department (HOSPITAL_COMMUNITY): Payer: Medicare Other

## 2014-03-19 ENCOUNTER — Encounter (HOSPITAL_COMMUNITY): Payer: Self-pay | Admitting: Emergency Medicine

## 2014-03-19 DIAGNOSIS — Z515 Encounter for palliative care: Secondary | ICD-10-CM

## 2014-03-19 DIAGNOSIS — E785 Hyperlipidemia, unspecified: Secondary | ICD-10-CM | POA: Diagnosis present

## 2014-03-19 DIAGNOSIS — G9340 Encephalopathy, unspecified: Secondary | ICD-10-CM | POA: Diagnosis present

## 2014-03-19 DIAGNOSIS — R0689 Other abnormalities of breathing: Secondary | ICD-10-CM

## 2014-03-19 DIAGNOSIS — J9600 Acute respiratory failure, unspecified whether with hypoxia or hypercapnia: Secondary | ICD-10-CM | POA: Diagnosis present

## 2014-03-19 DIAGNOSIS — N182 Chronic kidney disease, stage 2 (mild): Secondary | ICD-10-CM | POA: Diagnosis present

## 2014-03-19 DIAGNOSIS — Z8249 Family history of ischemic heart disease and other diseases of the circulatory system: Secondary | ICD-10-CM | POA: Diagnosis not present

## 2014-03-19 DIAGNOSIS — J449 Chronic obstructive pulmonary disease, unspecified: Secondary | ICD-10-CM | POA: Diagnosis present

## 2014-03-19 DIAGNOSIS — I25810 Atherosclerosis of coronary artery bypass graft(s) without angina pectoris: Secondary | ICD-10-CM | POA: Diagnosis present

## 2014-03-19 DIAGNOSIS — Y95 Nosocomial condition: Secondary | ICD-10-CM | POA: Diagnosis present

## 2014-03-19 DIAGNOSIS — Z87891 Personal history of nicotine dependence: Secondary | ICD-10-CM | POA: Diagnosis not present

## 2014-03-19 DIAGNOSIS — M810 Age-related osteoporosis without current pathological fracture: Secondary | ICD-10-CM | POA: Diagnosis present

## 2014-03-19 DIAGNOSIS — I2510 Atherosclerotic heart disease of native coronary artery without angina pectoris: Secondary | ICD-10-CM | POA: Diagnosis present

## 2014-03-19 DIAGNOSIS — Z8673 Personal history of transient ischemic attack (TIA), and cerebral infarction without residual deficits: Secondary | ICD-10-CM

## 2014-03-19 DIAGNOSIS — I5023 Acute on chronic systolic (congestive) heart failure: Principal | ICD-10-CM | POA: Diagnosis present

## 2014-03-19 DIAGNOSIS — I255 Ischemic cardiomyopathy: Secondary | ICD-10-CM | POA: Diagnosis present

## 2014-03-19 DIAGNOSIS — Z950 Presence of cardiac pacemaker: Secondary | ICD-10-CM | POA: Diagnosis not present

## 2014-03-19 DIAGNOSIS — R531 Weakness: Secondary | ICD-10-CM | POA: Insufficient documentation

## 2014-03-19 DIAGNOSIS — R600 Localized edema: Secondary | ICD-10-CM

## 2014-03-19 DIAGNOSIS — I129 Hypertensive chronic kidney disease with stage 1 through stage 4 chronic kidney disease, or unspecified chronic kidney disease: Secondary | ICD-10-CM | POA: Diagnosis present

## 2014-03-19 DIAGNOSIS — I480 Paroxysmal atrial fibrillation: Secondary | ICD-10-CM | POA: Diagnosis present

## 2014-03-19 DIAGNOSIS — R0600 Dyspnea, unspecified: Secondary | ICD-10-CM | POA: Insufficient documentation

## 2014-03-19 DIAGNOSIS — J984 Other disorders of lung: Secondary | ICD-10-CM

## 2014-03-19 DIAGNOSIS — J9601 Acute respiratory failure with hypoxia: Secondary | ICD-10-CM | POA: Diagnosis present

## 2014-03-19 DIAGNOSIS — J189 Pneumonia, unspecified organism: Secondary | ICD-10-CM | POA: Diagnosis present

## 2014-03-19 DIAGNOSIS — Z7189 Other specified counseling: Secondary | ICD-10-CM | POA: Insufficient documentation

## 2014-03-19 DIAGNOSIS — Z7951 Long term (current) use of inhaled steroids: Secondary | ICD-10-CM | POA: Diagnosis not present

## 2014-03-19 DIAGNOSIS — I252 Old myocardial infarction: Secondary | ICD-10-CM

## 2014-03-19 DIAGNOSIS — I10 Essential (primary) hypertension: Secondary | ICD-10-CM | POA: Diagnosis present

## 2014-03-19 DIAGNOSIS — I429 Cardiomyopathy, unspecified: Secondary | ICD-10-CM

## 2014-03-19 DIAGNOSIS — E871 Hypo-osmolality and hyponatremia: Secondary | ICD-10-CM | POA: Diagnosis present

## 2014-03-19 DIAGNOSIS — I639 Cerebral infarction, unspecified: Secondary | ICD-10-CM | POA: Diagnosis present

## 2014-03-19 DIAGNOSIS — K219 Gastro-esophageal reflux disease without esophagitis: Secondary | ICD-10-CM | POA: Diagnosis present

## 2014-03-19 LAB — CBC WITH DIFFERENTIAL/PLATELET
Basophils Absolute: 0 10*3/uL (ref 0.0–0.1)
Basophils Relative: 0 % (ref 0–1)
EOS ABS: 0.1 10*3/uL (ref 0.0–0.7)
Eosinophils Relative: 2 % (ref 0–5)
HEMATOCRIT: 39.8 % (ref 39.0–52.0)
HEMOGLOBIN: 13.4 g/dL (ref 13.0–17.0)
Lymphocytes Relative: 12 % (ref 12–46)
Lymphs Abs: 0.8 10*3/uL (ref 0.7–4.0)
MCH: 28.4 pg (ref 26.0–34.0)
MCHC: 33.7 g/dL (ref 30.0–36.0)
MCV: 84.3 fL (ref 78.0–100.0)
MONOS PCT: 9 % (ref 3–12)
Monocytes Absolute: 0.6 10*3/uL (ref 0.1–1.0)
NEUTROS PCT: 77 % (ref 43–77)
Neutro Abs: 5.4 10*3/uL (ref 1.7–7.7)
Platelets: 166 10*3/uL (ref 150–400)
RBC: 4.72 MIL/uL (ref 4.22–5.81)
RDW: 18.5 % — ABNORMAL HIGH (ref 11.5–15.5)
WBC: 6.9 10*3/uL (ref 4.0–10.5)

## 2014-03-19 LAB — URINALYSIS, ROUTINE W REFLEX MICROSCOPIC
BILIRUBIN URINE: NEGATIVE
Glucose, UA: NEGATIVE mg/dL
Ketones, ur: NEGATIVE mg/dL
Leukocytes, UA: NEGATIVE
Nitrite: NEGATIVE
PROTEIN: NEGATIVE mg/dL
Specific Gravity, Urine: 1.006 (ref 1.005–1.030)
UROBILINOGEN UA: 1 mg/dL (ref 0.0–1.0)
pH: 6.5 (ref 5.0–8.0)

## 2014-03-19 LAB — COMPREHENSIVE METABOLIC PANEL
ALK PHOS: 89 U/L (ref 39–117)
ALT: 28 U/L (ref 0–53)
ANION GAP: 13 (ref 5–15)
AST: 27 U/L (ref 0–37)
Albumin: 2.6 g/dL — ABNORMAL LOW (ref 3.5–5.2)
BILIRUBIN TOTAL: 1.4 mg/dL — AB (ref 0.3–1.2)
BUN: 13 mg/dL (ref 6–23)
CHLORIDE: 90 meq/L — AB (ref 96–112)
CO2: 23 mEq/L (ref 19–32)
CREATININE: 1.21 mg/dL (ref 0.50–1.35)
Calcium: 8 mg/dL — ABNORMAL LOW (ref 8.4–10.5)
GFR calc Af Amer: 61 mL/min — ABNORMAL LOW (ref >90)
GFR calc non Af Amer: 53 mL/min — ABNORMAL LOW (ref >90)
GLUCOSE: 87 mg/dL (ref 70–99)
Potassium: 4.5 mEq/L (ref 3.7–5.3)
Sodium: 126 mEq/L — ABNORMAL LOW (ref 137–147)
Total Protein: 6.4 g/dL (ref 6.0–8.3)

## 2014-03-19 LAB — URINE MICROSCOPIC-ADD ON

## 2014-03-19 LAB — I-STAT CG4 LACTIC ACID, ED: Lactic Acid, Venous: 1.12 mmol/L (ref 0.5–2.2)

## 2014-03-19 MED ORDER — FAMOTIDINE 20 MG PO TABS
20.0000 mg | ORAL_TABLET | Freq: Every day | ORAL | Status: DC
  Administered 2014-03-20 – 2014-03-22 (×3): 20 mg via ORAL
  Filled 2014-03-19 (×3): qty 1

## 2014-03-19 MED ORDER — FLUTICASONE PROPIONATE 50 MCG/ACT NA SUSP
1.0000 | Freq: Every day | NASAL | Status: DC | PRN
  Filled 2014-03-19: qty 16

## 2014-03-19 MED ORDER — SODIUM CHLORIDE 0.9 % IV SOLN
INTRAVENOUS | Status: AC
  Administered 2014-03-19: 17:00:00 via INTRAVENOUS

## 2014-03-19 MED ORDER — GABAPENTIN 300 MG PO CAPS
900.0000 mg | ORAL_CAPSULE | Freq: Two times a day (BID) | ORAL | Status: DC
  Administered 2014-03-19 – 2014-03-22 (×6): 900 mg via ORAL
  Filled 2014-03-19 (×6): qty 3

## 2014-03-19 MED ORDER — DEXTROSE 5 % IV SOLN
1.0000 g | Freq: Three times a day (TID) | INTRAVENOUS | Status: DC
  Administered 2014-03-19 – 2014-03-21 (×5): 1 g via INTRAVENOUS
  Filled 2014-03-19 (×6): qty 1

## 2014-03-19 MED ORDER — SODIUM CHLORIDE 0.9 % IV BOLUS (SEPSIS)
500.0000 mL | INTRAVENOUS | Status: AC
  Administered 2014-03-19: 500 mL via INTRAVENOUS

## 2014-03-19 MED ORDER — SODIUM CHLORIDE 0.9 % IV SOLN
2000.0000 mg | Freq: Once | INTRAVENOUS | Status: AC
  Administered 2014-03-19: 2000 mg via INTRAVENOUS
  Filled 2014-03-19: qty 2000

## 2014-03-19 MED ORDER — VANCOMYCIN HCL 10 G IV SOLR
1500.0000 mg | INTRAVENOUS | Status: DC
  Administered 2014-03-20 – 2014-03-21 (×2): 1500 mg via INTRAVENOUS
  Filled 2014-03-19 (×2): qty 1500

## 2014-03-19 MED ORDER — PREDNISONE 5 MG PO TABS
5.0000 mg | ORAL_TABLET | Freq: Every day | ORAL | Status: DC
  Administered 2014-03-20 – 2014-03-22 (×3): 5 mg via ORAL
  Filled 2014-03-19 (×4): qty 1

## 2014-03-19 MED ORDER — APIXABAN 5 MG PO TABS
5.0000 mg | ORAL_TABLET | Freq: Two times a day (BID) | ORAL | Status: DC
  Administered 2014-03-19 – 2014-03-22 (×6): 5 mg via ORAL
  Filled 2014-03-19 (×7): qty 1

## 2014-03-19 MED ORDER — DEXTROSE 5 % IV SOLN
2.0000 g | INTRAVENOUS | Status: AC
  Administered 2014-03-19: 2 g via INTRAVENOUS
  Filled 2014-03-19: qty 2

## 2014-03-19 MED ORDER — SODIUM CHLORIDE 0.9 % IV BOLUS (SEPSIS)
1000.0000 mL | INTRAVENOUS | Status: AC
  Administered 2014-03-19 (×3): 1000 mL via INTRAVENOUS

## 2014-03-19 MED ORDER — LORATADINE 10 MG PO TABS
10.0000 mg | ORAL_TABLET | Freq: Every day | ORAL | Status: DC
  Administered 2014-03-20 – 2014-03-22 (×3): 10 mg via ORAL
  Filled 2014-03-19 (×3): qty 1

## 2014-03-19 MED ORDER — MOMETASONE FURO-FORMOTEROL FUM 200-5 MCG/ACT IN AERO
2.0000 | INHALATION_SPRAY | Freq: Two times a day (BID) | RESPIRATORY_TRACT | Status: DC
  Administered 2014-03-20 – 2014-03-22 (×5): 2 via RESPIRATORY_TRACT
  Filled 2014-03-19: qty 8.8

## 2014-03-19 MED ORDER — LISINOPRIL 2.5 MG PO TABS
1.2500 mg | ORAL_TABLET | Freq: Every day | ORAL | Status: DC
  Filled 2014-03-19: qty 0.5

## 2014-03-19 MED ORDER — AMIODARONE HCL 200 MG PO TABS
400.0000 mg | ORAL_TABLET | Freq: Every day | ORAL | Status: DC
  Administered 2014-03-20 – 2014-03-22 (×3): 400 mg via ORAL
  Filled 2014-03-19 (×3): qty 2

## 2014-03-19 MED ORDER — ATORVASTATIN CALCIUM 20 MG PO TABS
20.0000 mg | ORAL_TABLET | Freq: Every day | ORAL | Status: DC
  Administered 2014-03-20 – 2014-03-22 (×3): 20 mg via ORAL
  Filled 2014-03-19 (×3): qty 1

## 2014-03-19 MED ORDER — FUROSEMIDE 40 MG PO TABS: 40.0000 mg | ORAL_TABLET | Freq: Every day | ORAL | Status: DC

## 2014-03-19 MED ORDER — METOPROLOL TARTRATE 50 MG PO TABS
100.0000 mg | ORAL_TABLET | Freq: Two times a day (BID) | ORAL | Status: DC
  Administered 2014-03-20 – 2014-03-22 (×5): 100 mg via ORAL
  Filled 2014-03-19 (×5): qty 2

## 2014-03-19 NOTE — ED Notes (Signed)
[employer] states it will take ~30 min to arrive at ED to interrogate pacemaker.

## 2014-03-19 NOTE — Progress Notes (Signed)
  CARE MANAGEMENT ED NOTE 03/19/2014  Patient:  Richard Chavez, Richard Chavez   Account Number:  [id number]  Date Initiated:  03/19/2014  Documentation initiated by:  Radford Pax  Subjective/Objective Assessment:   Patient presents to Ed with weakness and hypotension     Subjective/Objective Assessment Detail:   Patient admitted and discharged form the hospital from 10/17- 10/23 for Pnuemonia.  chest xray: Suspected chronic interstitial lung disease. Superimposed pneumonia  is difficult to exclude.     Action/Plan:   Action/Plan Detail:   Anticipated DC Date:       Status Recommendation to Physician:   Result of Recommendation:    Other ED Services  Consult Working Plan    DC Planning Services  Other    Choice offered to / List presented to:       [facility] arranged  HH-1 RN  HH-2 PT      [facility] agency  [facility]    Status of service:  Completed, signed off  ED Comments:   ED Comments Detail:  Patient listed as being active with homehalth RN with [facility]. EDCM spoke to [place], transition care specialist for [facility] at 1541pm who confirms patient is active with [facility] for RN and PT.  No further EDCM needs at this time.

## 2014-03-19 NOTE — ED Notes (Signed)
EMS called out for weakness. Pt getting gradually weaker over last couple days, today unable to stand. Hypotensive at 94/50 on arrival, increased to 118/74. Previous Lt sided weakness from previous stroke

## 2014-03-19 NOTE — Progress Notes (Signed)
03/19/2014 A. Rayjon Wery RNCM 1836pm EDCM spoke to patient and his son at bedside.  Patient confirms he lives at home with his son [name].  Patient has a walker, wheelchair, shower chair and bedside commode at home.  Patient noted to be wearing oxygen int he ED.  Patient does NOT wear oxygen at home. Patient's so reports patient requires assistance with ADL's.   Patient has a scheduled appointment to see his pcp Dr. Thea Silversmith today but patient was brought to the ED instead.  Patient's son reports patient was seen by his Urologist at [facility] twice since his last admsission.  Patient did receive a post follow up discharge call.  This information was placed into readmision focus note.  No further EDCM needs at this time.

## 2014-03-19 NOTE — ED Provider Notes (Signed)
CSN: [id number]     Arrival date & time 03/19/14  1124 History   First MD Initiated Contact with Patient 03/19/14 1206     Chief Complaint  Patient presents with  . Weakness     (Consider location/radiation/quality/duration/timing/severity/associated sxs/prior Treatment) HPI   Richard Chavez is a 80 y.o. male who presents by ambulance, from home, for weakness.  According to family members at seen, he has been gradually weaker over the last 2 days.  He is unable to stand, at home.  EMS found him to be hypotensive, and 94/50.  The patient is unable to give additional history.  The patient has had diagnosis of pneumonia requiring hospitalization, twice in the last 30 days.  There are no family members here with him, at this time.  Level V Caveat- Altered mental status  Past Medical History  Diagnosis Date  . CAD (coronary artery disease), native coronary artery     Status post CABG  . Hypertension   . Hyperlipidemia   . GERD (gastroesophageal reflux disease)   . COPD (chronic obstructive pulmonary disease)   . CRF (chronic renal failure)   . Barrett's esophagus   . OP (osteoporosis)   . CAD (coronary artery disease) of artery bypass graft 07/04/2011    LIMA-LAD; SVG-OM2-OM3, SVG-RCA -- all patent as of 05/27/11  . Syncope 07/04/2011  . Pacemaker 07/04/2011  . Cardiomyopathy, ischemic 07/2011; 09/2012    2D Echo - EF 35-40, mod dilated LA; Relook Echo 09/2012: EF 40-45%  . Myocardial infarction     25 yrs ago  . Peripheral vascular disease   . Arthritis   . BPH (benign prostatic hyperplasia)   . Polyp of colon     removed  . Sleep apnea     STOP BANG SCORE 4  . AV block, 3rd degree     Post St[employer] Jude pacemaker, EF 35-40, does have intermittent atrial tachycardia, either PAt or Afib  . BPH (benign prostatic hypertrophy) 11/15/2011  . Paroxysmal atrial fibrillation - now rate controlled (formally with [facility] and Hospital) 10/03/2012  . CVA (cerebral infarction) 10/09/2012    Noted to be in  atrial fibrillation with evaluation of pacemaker, prior to CVA. Right-sided MCA stroke with left arm weakness/mild paralysis -- notably improved   . Pneumonia 02/02/2014  . Shortness of breath   . Stroke     left hand weakness   Past Surgical History  Procedure Laterality Date  . Pacemaker placement  2007  . Cholecystectomy  2005  . Coronary artery bypass graft  1978    X3  . Lesion excision      from penis  . Cardiac catheterization  06/06/2011    occluded proximal RCA, ostial LAD and mid Circumflex after OM 1.; [place] LIMA-LAD, patent SVG-OM1-OM 2, patent SVG-RCA. Reduced ejection fraction of 30-35%.  . Cardiac catheterization  06/14/2010    LIMA to LAD, SVG to RCA, SVG to OM1 Circumflex, 100% occluded RCA, 100% occluded circumfkex, 100% occluded LAD, no evidence of graft dysfunction, continue medical therapy  . Cardiac catheterization  04/12/2003    3 vessel coronary artery disease, continue medical treatment  . Cardiac catheterization  10/07/2000    Severe 3 vessel coronary artery disease, continue medical treatment  . Doppler echocardiography  10/02/2012    EF 40-45%; possible grade 1 diastolic dysfunction, but A. fib precludes this. Mild atrial dilatation bilaterally. Moderately elevated pulmonary pressures of 40 mmHg.  . Prostate surgery     Family History  Problem  Relation Age of Onset  . Leukemia Father   . Heart disease Father   . Alzheimer's disease Mother   . Arthritis Mother   . Lung cancer Son 10  . Alzheimer's disease Maternal Grandmother    History  Substance Use Topics  . Smoking status: Former Smoker -- 0.30 packs/day for 15 years    Types: Cigarettes    Quit date: 05/07/1985  . Smokeless tobacco: Never Used  . Alcohol Use: No    Review of Systems  Unable to perform ROS     Allergies  Review of patient's allergies indicates no known allergies.  Home Medications   Prior to Admission medications   Medication Sig Start Date End Date Taking? Authorizing  Provider  albuterol-ipratropium (COMBIVENT) 18-103 MCG/ACT inhaler Inhale 2 puffs into the lungs 2 (two) times daily.    Yes Historical Provider, MD  amiodarone (PACERONE) 400 MG tablet Take 1 tablet (400 mg total) by mouth daily. 02/06/14  Yes Marinda Elk, MD  apixaban (ELIQUIS) 5 MG TABS tablet Take 1 tablet (5 mg total) by mouth 2 (two) times daily. 11/26/13  Yes Marykay Lex, MD  atorvastatin (LIPITOR) 20 MG tablet Take 20 mg by mouth daily with breakfast.    Yes Historical Provider, MD  fluticasone (FLONASE) 50 MCG/ACT nasal spray Place 1 spray into both nostrils 2 (two) times daily as needed for allergies or rhinitis.   Yes Historical Provider, MD  Fluticasone-Salmeterol (ADVAIR DISKUS) 500-50 MCG/DOSE AEPB Inhale 1 puff into the lungs every 12 (twelve) hours.    Yes Historical Provider, MD  furosemide (LASIX) 40 MG tablet Take 1 tablet (40 mg total) by mouth daily. 02/06/14  Yes Marinda Elk, MD  gabapentin (NEURONTIN) 300 MG capsule Take 300 mg by mouth 3 (three) times daily.   Yes Historical Provider, MD  guaiFENesin (MUCINEX) 600 MG 12 hr tablet Take 1,200 mg by mouth 2 (two) times daily.   Yes Historical Provider, MD  lisinopril (PRINIVIL,ZESTRIL) 2.5 MG tablet Take 0.5 tablets (1.25 mg total) by mouth daily. 02/06/14  Yes Marinda Elk, MD  loratadine (CLARITIN) 10 MG tablet Take 10 mg by mouth daily with breakfast.    Yes Historical Provider, MD  Magnesium Chloride (SLOW-MAG) 535 (64 MG) MG TBCR Take 64 mg by mouth 2 (two) times daily.    Yes Historical Provider, MD  metoprolol (LOPRESSOR) 100 MG tablet Take 100 mg by mouth 2 (two) times daily.   Yes Historical Provider, MD  Omega-3 Fatty Acids (FISH OIL) 1000 MG CAPS Take 1,000 mg by mouth daily.    Yes Historical Provider, MD  potassium chloride (K-DUR,KLOR-CON) 10 MEQ tablet Take 10 mEq by mouth 2 (two) times daily.   Yes Historical Provider, MD  potassium chloride 20 MEQ/15ML (10%) solution Take 15 mLs (20 mEq  total) by mouth daily. 02/06/14  Yes Marinda Elk, MD  predniSONE (DELTASONE) 5 MG tablet Take 5 mg by mouth daily.   Yes Historical Provider, MD  ranitidine (ZANTAC) 150 MG capsule Take 150 mg by mouth 2 (two) times daily.    Yes Historical Provider, MD  tiotropium (SPIRIVA) 18 MCG inhalation capsule Place 18 mcg into inhaler and inhale daily with breakfast.    Yes Historical Provider, MD  Vitamin D, Ergocalciferol, (DRISDOL) 50000 UNITS CAPS Take 50,000 Units by mouth every 7 (seven) days. Take on Fridays   Yes Historical Provider, MD  vitamin E 1000 UNIT capsule Take 1,000 Units by mouth daily with breakfast.  Yes Historical Provider, MD   BP 128/56 mmHg  Pulse 75  Temp(Src) 97.7 F (36.5 C) (Oral)  Resp 22  Ht 6' (1.829 m)  Wt 230 lb (104.327 kg)  BMI 31.19 kg/m2  SpO2 100% Physical Exam  Constitutional: He appears well-developed. He appears distressed (he is sleepy, able to respond to simple commands).  Elderly, frail  HENT:  Head: Normocephalic and atraumatic.  Right Ear: External ear normal.  Left Ear: External ear normal.  Eyes: Conjunctivae and EOM are normal. Pupils are equal, round, and reactive to light.  Neck: Normal range of motion and phonation normal. Neck supple.  Cardiovascular: Normal rate, regular rhythm and normal heart sounds.   Pulmonary/Chest: Effort normal. No respiratory distress. He exhibits no bony tenderness.  Decreased air movement bilaterally  Abdominal: Soft. There is no tenderness.  Musculoskeletal: Normal range of motion. He exhibits edema (2+ peripheral edema).  Neurological: He is alert. No cranial nerve deficit or sensory deficit. He exhibits normal muscle tone. Coordination normal.  No dysarthria.  Lethargic  Skin: Skin is warm, dry and intact.  Psychiatric: His behavior is normal.  Nursing note and vitals reviewed.   ED Course  Procedures (including critical care time)  Medications  sodium chloride 0.9 % bolus 1,000 mL (1,000 mLs  Intravenous New Bag/Given 03/19/14 1353)    Followed by  sodium chloride 0.9 % bolus 500 mL (not administered)  vancomycin (VANCOCIN) 2,000 mg in sodium chloride 0.9 % 500 mL IVPB (2,000 mg Intravenous New Bag/Given 03/19/14 1418)  ceFEPIme (MAXIPIME) 1 g in dextrose 5 % 50 mL IVPB (not administered)  vancomycin (VANCOCIN) 1,500 mg in sodium chloride 0.9 % 500 mL IVPB (not administered)  ceFEPIme (MAXIPIME) 2 g in dextrose 5 % 50 mL IVPB (0 g Intravenous Stopped 03/19/14 1420)    Patient Vitals for the past 24 hrs:  BP Temp Temp src Pulse Resp SpO2 Height Weight  03/19/14 1430 128/56 mmHg - - 75 22 100 % - -  03/19/14 1415 127/61 mmHg - - 75 15 100 % - -  03/19/14 1400 132/57 mmHg - - 75 16 92 % - -  03/19/14 1345 107/69 mmHg - - 75 15 100 % - -  03/19/14 1330 (!) 105/53 mmHg - - 80 16 100 % - -  03/19/14 1315 116/67 mmHg - - 75 17 100 % - -  03/19/14 1259 - - - - - - 6' (1.829 m) 230 lb (104.327 kg)  03/19/14 1215 102/60 mmHg - - 69 17 100 % - -  03/19/14 1200 112/57 mmHg - - 70 18 98 % - -  03/19/14 1128 106/63 mmHg 97.7 F (36.5 C) Oral 70 20 95 % - -  03/19/14 1124 - - - - - 93 % - -    2:49 PM Reevaluation with update and discussion. After initial assessment and treatment, an updated evaluation reveals son is here now and give similar history of gradually worse weakness over 2 days. He has been fluid restricting the patient to about 50 ounces each day because of history of heart failure.  There's been no change in his respiratory status.  He is having to use a walker ever since his hospital discharge, 2-1/2 weeks ago.  Findings discussed with son, all questions answered.Mancel Bale. Dashanique Brownstein L   2:55 PM-Consult complete with Dr. Blake DivineAkula. Patient case explained and discussed. He agrees to admit patient for further evaluation and treatment. Call ended at 15:05   Labs Review Labs Reviewed  CBC  WITH DIFFERENTIAL - Abnormal; Notable for the following:    RDW 18.5 (*)    All other components  within normal limits  COMPREHENSIVE METABOLIC PANEL - Abnormal; Notable for the following:    Sodium 126 (*)    Chloride 90 (*)    Calcium 8.0 (*)    Albumin 2.6 (*)    Total Bilirubin 1.4 (*)    GFR calc non Af Amer 53 (*)    GFR calc Af Amer 61 (*)    All other components within normal limits  URINALYSIS, ROUTINE W REFLEX MICROSCOPIC - Abnormal; Notable for the following:    APPearance CLOUDY (*)    Hgb urine dipstick SMALL (*)    All other components within normal limits  CULTURE, BLOOD (ROUTINE X 2)  CULTURE, BLOOD (ROUTINE X 2)  URINE CULTURE  URINE MICROSCOPIC-ADD ON  I-STAT CG4 LACTIC ACID, ED  I-STAT CG4 LACTIC ACID, ED    Imaging Review Dg Chest Port 1 View  03/19/2014   CLINICAL DATA:  Weakness, hypotensive  EXAM: PORTABLE CHEST - 1 VIEW  COMPARISON:  None.  FINDINGS: Chronic interstitial/airspace opacities, suspicious for chronic interstitial lung disease. Superimposed pneumonia is difficult to exclude. No pleural effusion or pneumothorax.  Cardiomegaly. Postsurgical changes related to prior CABG. Right subclavian pacemaker.  IMPRESSION: Suspected chronic interstitial lung disease. Superimposed pneumonia is difficult to exclude.   Electronically Signed   By: Charline Bills M.D.   On: 03/19/2014 13:05     EKG Interpretation   Date/Time:  Friday March 19 2014 11:48:34 EST Ventricular Rate:  70 PR Interval:  179 QRS Duration: 133 QT Interval:  458 QTC Calculation: 494 R Axis:   -20 Text Interpretation:  A-V Paced with inhibition No further analysis  attempted due to paced rhythm Confirmed by Ahmiyah Coil  MD, Tarius Stangelo ([id number]) on  03/19/2014 1:46:47 PM      MDM   Final diagnoses:  Weakness  Healthcare-associated pneumonia  Chronic lung disease   Nonspecific weakness, with recurrent lung infections over the last several years.  Also, 2 upper pneumonias in the last 5 weeks.  Chest x-ray today indicates possible chronic lung disease.  Patient does not appear  septic today.  Blood pressure improved with IV fluids.  He will require admission for further monitoring.  He will likely need pulmonary evaluation for chronic and recurrent symptoms.  Nursing Notes Reviewed/ Care Coordinated Applicable Imaging Reviewed Interpretation of Laboratory Data incorporated into ED treatment   Plan: Admit    Flint Melter, MD 03/19/14 [phone]

## 2014-03-19 NOTE — ED Notes (Signed)
Unable to interrogate patient's pacemaker. [employer] rep has been called to interrogate pacemaker.   Pacemaker model [id number] serial [id number]

## 2014-03-19 NOTE — ED Notes (Signed)
[employer] interrogated pacemaker, state it is working properly. Pacemaker report in pt's chart.

## 2014-03-19 NOTE — Progress Notes (Signed)
Utilization Review completed.  Hershall Benkert RN CM  

## 2014-03-19 NOTE — ED Notes (Signed)
Bed: WA20 Expected date:  Expected time:  Means of arrival:  Comments: EMS 

## 2014-03-19 NOTE — H&P (Signed)
[facility] History and Physical  Richard ShuttersFred M Coulon ZOX:[id number] DOB: Sep 04, 1926 DOA: 03/19/2014  Referring physician: EDP  PCP: Thayer HeadingsMACKENZIE,BRIAN, MD   Chief Complaint: confusion and generalized weakness for the last 2 days.  HPI: Richard Chavez is a 80 y.o. male with prior h/o  hypertension, Ischemic cardiomyopathy, CRF, COPD, comes in for confusion and generalized weakness for 2 days. Patient is a poor historian and most of the history was obtained from his son at bedside. He had 2 episodes of pneumonia in the last 3 months. On arrival to ED, he was found to be hypoxic, and his CXR revealed chronic lung disease with questionable superimposed pneumonia. He was referred for admission to medical service   Review of Systems:  Could not be obtained. Pt is confused.   Past Medical History  Diagnosis Date  . CAD (coronary artery disease), native coronary artery     Status post CABG  . Hypertension   . Hyperlipidemia   . GERD (gastroesophageal reflux disease)   . COPD (chronic obstructive pulmonary disease)   . CRF (chronic renal failure)   . Barrett's esophagus   . OP (osteoporosis)   . CAD (coronary artery disease) of artery bypass graft 07/04/2011    LIMA-LAD; SVG-OM2-OM3, SVG-RCA -- all patent as of 05/27/11  . Syncope 07/04/2011  . Pacemaker 07/04/2011  . Cardiomyopathy, ischemic 07/2011; 09/2012    2D Echo - EF 35-40, mod dilated LA; Relook Echo 09/2012: EF 40-45%  . Myocardial infarction     25 yrs ago  . Peripheral vascular disease   . Arthritis   . BPH (benign prostatic hyperplasia)   . Polyp of colon     removed  . Sleep apnea     STOP BANG SCORE 4  . AV block, 3rd degree     Post [employer]. Jude pacemaker, EF 35-40, does have intermittent atrial tachycardia, either PAt or Afib  . BPH (benign prostatic hypertrophy) 11/15/2011  . Paroxysmal atrial fibrillation - now rate controlled (formally with [facility]) 10/03/2012  . CVA (cerebral infarction) 10/09/2012    Noted to be in  atrial fibrillation with evaluation of pacemaker, prior to CVA. Right-sided MCA stroke with left arm weakness/mild paralysis -- notably improved   . Pneumonia 02/02/2014  . Shortness of breath   . Stroke     left hand weakness   Past Surgical History  Procedure Laterality Date  . Pacemaker placement  2007  . Cholecystectomy  2005  . Coronary artery bypass graft  1978    X3  . Lesion excision      from penis  . Cardiac catheterization  06/06/2011    occluded proximal RCA, ostial LAD and mid Circumflex after OM 1.; [place] LIMA-LAD, patent SVG-OM1-OM 2, patent SVG-RCA. Reduced ejection fraction of 30-35%.  . Cardiac catheterization  06/14/2010    LIMA to LAD, SVG to RCA, SVG to OM1 Circumflex, 100% occluded RCA, 100% occluded circumfkex, 100% occluded LAD, no evidence of graft dysfunction, continue medical therapy  . Cardiac catheterization  04/12/2003    3 vessel coronary artery disease, continue medical treatment  . Cardiac catheterization  10/07/2000    Severe 3 vessel coronary artery disease, continue medical treatment  . Doppler echocardiography  10/02/2012    EF 40-45%; possible grade 1 diastolic dysfunction, but A. fib precludes this. Mild atrial dilatation bilaterally. Moderately elevated pulmonary pressures of 40 mmHg.  . Prostate surgery     Social History:  reports that he quit smoking about 28 years  ago. His smoking use included Cigarettes. He has a 4.5 pack-year smoking history. He has never used smokeless tobacco. He reports that he does not drink alcohol or use illicit drugs.  No Known Allergies  Family History  Problem Relation Age of Onset  . Leukemia Father   . Heart disease Father   . Alzheimer's disease Mother   . Arthritis Mother   . Lung cancer Son 67  . Alzheimer's disease Maternal Grandmother      Prior to Admission medications   Medication Sig Start Date End Date Taking? Authorizing Provider  albuterol-ipratropium (COMBIVENT) 18-103 MCG/ACT inhaler Inhale 2  puffs into the lungs 2 (two) times daily.    Yes Historical Provider, MD  amiodarone (PACERONE) 400 MG tablet Take 1 tablet (400 mg total) by mouth daily. 02/06/14  Yes Marinda Elk, MD  apixaban (ELIQUIS) 5 MG TABS tablet Take 1 tablet (5 mg total) by mouth 2 (two) times daily. 11/26/13  Yes Marykay Lex, MD  atorvastatin (LIPITOR) 20 MG tablet Take 20 mg by mouth daily with breakfast.    Yes Historical Provider, MD  fluticasone (FLONASE) 50 MCG/ACT nasal spray Place 1 spray into both nostrils daily as needed for allergies or rhinitis.    Yes Historical Provider, MD  Fluticasone-Salmeterol (ADVAIR DISKUS) 500-50 MCG/DOSE AEPB Inhale 1 puff into the lungs every 12 (twelve) hours.    Yes Historical Provider, MD  furosemide (LASIX) 40 MG tablet Take 1 tablet (40 mg total) by mouth daily. 02/06/14  Yes Marinda Elk, MD  gabapentin (NEURONTIN) 300 MG capsule Take 900 mg by mouth 2 (two) times daily.    Yes Historical Provider, MD  guaiFENesin (MUCINEX) 600 MG 12 hr tablet Take 1,200 mg by mouth 2 (two) times daily.   Yes Historical Provider, MD  lisinopril (PRINIVIL,ZESTRIL) 2.5 MG tablet Take 0.5 tablets (1.25 mg total) by mouth daily. 02/06/14  Yes Marinda Elk, MD  loratadine (CLARITIN) 10 MG tablet Take 10 mg by mouth daily with breakfast.    Yes Historical Provider, MD  Magnesium Chloride (SLOW-MAG) 535 (64 MG) MG TBCR Take 64 mg by mouth 2 (two) times daily.    Yes Historical Provider, MD  metoprolol (LOPRESSOR) 100 MG tablet Take 100 mg by mouth 2 (two) times daily.   Yes Historical Provider, MD  Omega-3 Fatty Acids (FISH OIL) 1000 MG CAPS Take 1,000 mg by mouth daily.    Yes Historical Provider, MD  potassium chloride (K-DUR,KLOR-CON) 10 MEQ tablet Take 10 mEq by mouth 2 (two) times daily.   Yes Historical Provider, MD  potassium chloride 20 MEQ/15ML (10%) SOLN Take 20 mEq by mouth 3 (three) times daily.   Yes Historical Provider, MD  predniSONE (DELTASONE) 5 MG tablet Take 5  mg by mouth daily.   Yes Historical Provider, MD  ranitidine (ZANTAC) 150 MG capsule Take 150 mg by mouth 2 (two) times daily.    Yes Historical Provider, MD  tiotropium (SPIRIVA) 18 MCG inhalation capsule Place 18 mcg into inhaler and inhale daily with breakfast.    Yes Historical Provider, MD  Vitamin D, Ergocalciferol, (DRISDOL) 50000 UNITS CAPS Take 50,000 Units by mouth every 7 (seven) days. Take on Fridays   Yes Historical Provider, MD  vitamin E 1000 UNIT capsule Take 1,000 Units by mouth daily with breakfast.    Yes Historical Provider, MD  potassium chloride 20 MEQ/15ML (10%) solution Take 15 mLs (20 mEq total) by mouth daily. Patient taking differently: Take by mouth daily.  02/06/14   Marinda Elk, MD   Physical Exam: Filed Vitals:   03/19/14 1400 03/19/14 1415 03/19/14 1430 03/19/14 1636  BP: 132/57 127/61 128/56 133/44  Pulse: 75 75 75 71  Temp:    97.4 F (36.3 C)  TempSrc:    Oral  Resp: 16 15 22 18   Height:    6' (1.829 m)  Weight:    102.558 kg (226 lb 1.6 oz)  SpO2: 92% 100% 100% 100%    Wt Readings from Last 3 Encounters:  03/19/14 102.558 kg (226 lb 1.6 oz)  02/24/14 104.45 kg (230 lb 4.3 oz)  02/06/14 100.8 kg (222 lb 3.6 oz)    General:  Appears calm and comfortable Eyes: PERRL, normal lids, irises & conjunctiva Neck: no LAD, masses or thyromegaly Cardiovascular: RRR, no m/r/g. No LE edema. Telemetry: SR, no arrhythmias  Respiratory: CTA bilaterally, no w/r/r. Normal respiratory effort. Abdomen: soft, ntnd Skin: no rash or induration seen on limited exam Musculoskeletal: grossly normal tone BUE/BLE Neurologic: he is oriented to place and person only, able to move all his extremities.speech normal.           Labs on Admission:  Basic Metabolic Panel:  Recent Labs Lab 03/19/14 1302  NA 126*  K 4.5  CL 90*  CO2 23  GLUCOSE 87  BUN 13  CREATININE 1.21  CALCIUM 8.0*   Liver Function Tests:  Recent Labs Lab 03/19/14 1302  AST 27  ALT  28  ALKPHOS 89  BILITOT 1.4*  PROT 6.4  ALBUMIN 2.6*   No results for input(s): LIPASE, AMYLASE in the last 168 hours. No results for input(s): AMMONIA in the last 168 hours. CBC:  Recent Labs Lab 03/19/14 1302  WBC 6.9  NEUTROABS 5.4  HGB 13.4  HCT 39.8  MCV 84.3  PLT 166   Cardiac Enzymes: No results for input(s): CKTOTAL, CKMB, CKMBINDEX, TROPONINI in the last 168 hours.  BNP (last 3 results)  Recent Labs  01/27/14 0929 02/03/14 0915 02/20/14 1853  PROBNP 6726.0* 3422.0* 3557.0*   CBG: No results for input(s): GLUCAP in the last 168 hours.  Radiological Exams on Admission: Dg Chest Port 1 View  03/19/2014   CLINICAL DATA:  Weakness, hypotensive  EXAM: PORTABLE CHEST - 1 VIEW  COMPARISON:  None.  FINDINGS: Chronic interstitial/airspace opacities, suspicious for chronic interstitial lung disease. Superimposed pneumonia is difficult to exclude. No pleural effusion or pneumothorax.  Cardiomegaly. Postsurgical changes related to prior CABG. Right subclavian pacemaker.  IMPRESSION: Suspected chronic interstitial lung disease. Superimposed pneumonia is difficult to exclude.   Electronically Signed   By: Charline Bills M.D.   On: 03/19/2014 13:05    EKG: paced. Assessment/Plan Active Problems:   HCAP (healthcare-associated pneumonia)    Hhealth care associated pneumonia: Admitted to telemetry and started him on IV antibiotics. Blood cultures done and pending.  Speech eval ordered for evaluation of dysphagia and ? Aspiration.  Keep saturations greater than 90%  Ischemic cardiomyopathy: - resume home medications. He denies any chest pain or sob at this time Resume lasix at home dose .    Hyponatremia Unclear etilogy. He doesn't appear to be fluid overloaded . He might be slightly dehydrated.  Will get tsh, serum and urine osmolarity and am cortisol.    COPD; no wheezing heard. Resume with bronchodilators as needed   Hypertension: controlled.  Resume home  meds.    Cad S/P CABG Currently denies any chest pain .   Confusion  : Probably worsening  of his dementia. Vs metabolic encephalopathy from hyponatremia.  As per the son, he has functionally declined over the last 2 months, both physically and cognitively.    Generalized weakness Physical deconditioning. PT eval when able.    Stage 2 CKD Stable and at baseline.   Paroxysmal Atrial fibrillation: Rate controlled. Pacemaker interrogated.  Resume eliquis.           Code Status: currently full code.  DVT Prophylaxis: Family Communication: son at bedside.  Disposition Plan: admit to telemetry  Time spent: 65 min  [facility] Pager [phone]

## 2014-03-19 NOTE — Progress Notes (Signed)
ANTIBIOTIC CONSULT NOTE - INITIAL  Pharmacy Consult for vancomycin/cefepime Indication: pneumonia  No Known Allergies  Patient Measurements:   Adjusted Body Weight:   Vital Signs: Temp: 97.7 F (36.5 C) (11/13 1128) Temp Source: Oral (11/13 1128) BP: 106/63 mmHg (11/13 1128) Pulse Rate: 70 (11/13 1128) Intake/Output from previous day:   Intake/Output from this shift:    Labs: No results for input(s): WBC, HGB, PLT, LABCREA, CREATININE in the last 72 hours. CrCl cannot be calculated (Unknown ideal weight.). No results for input(s): VANCOTROUGH, VANCOPEAK, VANCORANDOM, GENTTROUGH, GENTPEAK, GENTRANDOM, TOBRATROUGH, TOBRAPEAK, TOBRARND, AMIKACINPEAK, AMIKACINTROU, AMIKACIN in the last 72 hours.   Microbiology: Recent Results (from the past 720 hour(s))  Blood culture (routine x 2)     Status: None   Collection Time: 02/20/14  6:53 PM  Result Value Ref Range Status   Specimen Description BLOOD RIGHT ANTECUBITAL  Final   Special Requests BOTTLES DRAWN AEROBIC AND ANAEROBIC 5CC EA  Final   Culture  Setup Time   Final    02/20/2014 23:35 Performed at [facility]   Culture   Final    STAPHYLOCOCCUS SPECIES (COAGULASE NEGATIVE) Note: THE SIGNIFICANCE OF ISOLATING THIS ORGANISM FROM A SINGLE SET OF BLOOD CULTURES WHEN MULTIPLE SETS ARE DRAWN IS UNCERTAIN. PLEASE NOTIFY THE MICROBIOLOGY DEPARTMENT WITHIN ONE WEEK IF SPECIATION AND SENSITIVITIES ARE REQUIRED. Note: Gram Stain Report Called to,Read Back By and Verified With: STEWART WATERS @ 1231 ON [id number] BY [facility] Performed at [facility]   Report Status 02/23/2014 FINAL  Final  Blood culture (routine x 2)     Status: None   Collection Time: 02/20/14  7:03 PM  Result Value Ref Range Status   Specimen Description BLOOD RIGHT WRIST  Final   Special Requests BOTTLES DRAWN AEROBIC AND ANAEROBIC 5CC EA  Final   Culture  Setup Time   Final    02/20/2014 23:35 Performed at [facility]   Culture   Final   NO GROWTH 5 DAYS Performed at [facility]   Report Status 02/26/2014 FINAL  Final  Urine culture     Status: None   Collection Time: 02/20/14  8:12 PM  Result Value Ref Range Status   Specimen Description URINE, CATHETERIZED  Final   Special Requests NONE  Final   Culture  Setup Time   Final    02/20/2014 20:33 Performed at [facility] Count   Final    >=100,000 COLONIES/ML Performed at [facility]   Culture   Final    ENTEROCOCCUS SPECIES Performed at [facility]   Report Status 02/23/2014 FINAL  Final   Organism ID, Bacteria ENTEROCOCCUS SPECIES  Final      Susceptibility   Enterococcus species - MIC*    AMPICILLIN <=2 SENSITIVE Sensitive     LEVOFLOXACIN 1 SENSITIVE Sensitive     NITROFURANTOIN <=16 SENSITIVE Sensitive     VANCOMYCIN 1 SENSITIVE Sensitive     TETRACYCLINE >=16 RESISTANT Resistant     * ENTEROCOCCUS SPECIES  MRSA PCR Screening     Status: Abnormal   Collection Time: 02/20/14 11:34 PM  Result Value Ref Range Status   MRSA by PCR POSITIVE (A) NEGATIVE Final    Comment:        The GeneXpert MRSA Assay (FDA approved for NASAL specimens only), is one component of a comprehensive MRSA colonization surveillance program. It is not intended to diagnose MRSA infection nor to guide or monitor treatment for MRSA infections.  RESULT CALLED TO, READ BACK BY AND VERIFIED WITH: SHAW,M RN 0134 02/21/14 MITCHELL,L    Medical History: Past Medical History  Diagnosis Date  . CAD (coronary artery disease), native coronary artery     Status post CABG  . Hypertension   . Hyperlipidemia   . GERD (gastroesophageal reflux disease)   . COPD (chronic obstructive pulmonary disease)   . CRF (chronic renal failure)   . Barrett's esophagus   . OP (osteoporosis)   . CAD (coronary artery disease) of artery bypass graft 07/04/2011    LIMA-LAD; SVG-OM2-OM3, SVG-RCA -- all patent as of 05/27/11  . Syncope 07/04/2011  . Pacemaker  07/04/2011  . Cardiomyopathy, ischemic 07/2011; 09/2012    2D Echo - EF 35-40, mod dilated LA; Relook Echo 09/2012: EF 40-45%  . Myocardial infarction     25 yrs ago  . Peripheral vascular disease   . Arthritis   . BPH (benign prostatic hyperplasia)   . Polyp of colon     removed  . Sleep apnea     STOP BANG SCORE 4  . AV block, 3rd degree     Post St. Jude pacemaker, EF 35-40, does have intermittent atrial tachycardia, either PAt or Afib  . BPH (benign prostatic hypertrophy) 11/15/2011  . Paroxysmal atrial fibrillation - now rate controlled (formally with [facility] and Hospital) 10/03/2012  . CVA (cerebral infarction) 10/09/2012    Noted to be in atrial fibrillation with evaluation of pacemaker, prior to CVA. Right-sided MCA stroke with left arm weakness/mild paralysis -- notably improved   . Pneumonia 02/02/2014  . Shortness of breath   . Stroke     left hand weakness   Assessment: [age] YOM presents with weakness.  Starting broad spectrum antibiotics for HCAP.   11/13 >> vancomycin >> 11/13 >> cefepime  >>    Tmax: afeb WBCs: WNL Renal: SCr = 1.21 for normalized CrCl = 2945ml/min  11/13 blood: 11/13 urine:  / sputum:   Goal of Therapy:  Vancomycin trough level 15-20 mcg/ml  Plan:   Vancomycin 2gm IV x 1 then 1500mg  IV q24h  Check steady state trough  Cefepime 2gm IV x 1 then 1gm IV q8h  Monitor renal function  Follow-up cultures  Juliette Alcideustin Zeigler, PharmD, BCPS.   Pager: [phone]  03/19/2014,12:32 PM

## 2014-03-19 NOTE — ED Notes (Signed)
Son is at bedside

## 2014-03-19 NOTE — Plan of Care (Signed)
Problem: Phase I Progression Outcomes Goal: Dyspnea controlled at rest Outcome: Completed/Met Date Met:  03/19/14 Goal: Pain controlled with appropriate interventions Outcome: Completed/Met Date Met:  03/19/14 Goal: First antibiotic given within 6hrs of admit Outcome: Completed/Met Date Met:  03/19/14 Goal: Confirm chest x-ray completed Outcome: Completed/Met Date Met:  03/19/14 Goal: Consider pulmonary consult Outcome: Progressing Goal: Voiding-avoid urinary catheter unless indicated Outcome: Not Progressing Patient has foley catheter at home placed by urology Goal: Hemodynamically stable Outcome: Progressing Goal: Other Phase I Outcomes/Goals Outcome: Progressing

## 2014-03-20 DIAGNOSIS — R609 Edema, unspecified: Secondary | ICD-10-CM

## 2014-03-20 DIAGNOSIS — J9601 Acute respiratory failure with hypoxia: Secondary | ICD-10-CM

## 2014-03-20 DIAGNOSIS — G9340 Encephalopathy, unspecified: Secondary | ICD-10-CM

## 2014-03-20 DIAGNOSIS — N182 Chronic kidney disease, stage 2 (mild): Secondary | ICD-10-CM

## 2014-03-20 DIAGNOSIS — I10 Essential (primary) hypertension: Secondary | ICD-10-CM

## 2014-03-20 LAB — URINE CULTURE: Colony Count: 50000

## 2014-03-20 LAB — BASIC METABOLIC PANEL
Anion gap: 12 (ref 5–15)
BUN: 11 mg/dL (ref 6–23)
CHLORIDE: 101 meq/L (ref 96–112)
CO2: 21 meq/L (ref 19–32)
CREATININE: 1.12 mg/dL (ref 0.50–1.35)
Calcium: 7.4 mg/dL — ABNORMAL LOW (ref 8.4–10.5)
GFR calc Af Amer: 67 mL/min — ABNORMAL LOW (ref >90)
GFR calc non Af Amer: 58 mL/min — ABNORMAL LOW (ref >90)
Glucose, Bld: 73 mg/dL (ref 70–99)
Potassium: 4.3 mEq/L (ref 3.7–5.3)
Sodium: 134 mEq/L — ABNORMAL LOW (ref 137–147)

## 2014-03-20 LAB — PRO B NATRIURETIC PEPTIDE: PRO B NATRI PEPTIDE: 8409 pg/mL — AB (ref 0–450)

## 2014-03-20 LAB — TSH: TSH: 1.34 u[IU]/mL (ref 0.350–4.500)

## 2014-03-20 LAB — SODIUM, URINE, RANDOM: Sodium, Ur: 69 mEq/L

## 2014-03-20 LAB — OSMOLALITY, URINE: OSMOLALITY UR: 465 mosm/kg (ref 390–1090)

## 2014-03-20 LAB — OSMOLALITY: Osmolality: 273 mOsm/kg — ABNORMAL LOW (ref 275–300)

## 2014-03-20 LAB — STREP PNEUMONIAE URINARY ANTIGEN: Strep Pneumo Urinary Antigen: NEGATIVE

## 2014-03-20 LAB — CORTISOL: Cortisol, Plasma: 1.7 ug/dL

## 2014-03-20 MED ORDER — ALBUTEROL SULFATE (2.5 MG/3ML) 0.083% IN NEBU: 2.5000 mg | INHALATION_SOLUTION | RESPIRATORY_TRACT | Status: DC | PRN

## 2014-03-20 MED ORDER — FUROSEMIDE 10 MG/ML IJ SOLN
40.0000 mg | Freq: Two times a day (BID) | INTRAMUSCULAR | Status: DC
  Administered 2014-03-20 – 2014-03-22 (×4): 40 mg via INTRAVENOUS
  Filled 2014-03-20 (×4): qty 4

## 2014-03-20 MED ORDER — FUROSEMIDE 10 MG/ML IJ SOLN
INTRAMUSCULAR | Status: AC
  Administered 2014-03-20: 40 mg
  Filled 2014-03-20: qty 4

## 2014-03-20 MED ORDER — IPRATROPIUM-ALBUTEROL 0.5-2.5 (3) MG/3ML IN SOLN
3.0000 mL | RESPIRATORY_TRACT | Status: DC
  Administered 2014-03-20 (×2): 3 mL via RESPIRATORY_TRACT
  Filled 2014-03-20 (×2): qty 3

## 2014-03-20 MED ORDER — IPRATROPIUM-ALBUTEROL 0.5-2.5 (3) MG/3ML IN SOLN
3.0000 mL | Freq: Four times a day (QID) | RESPIRATORY_TRACT | Status: DC
  Administered 2014-03-21 – 2014-03-22 (×7): 3 mL via RESPIRATORY_TRACT
  Filled 2014-03-20 (×7): qty 3

## 2014-03-20 NOTE — Progress Notes (Signed)
[facility] PROGRESS NOTE  Richard ShuttersFred M Chavez ZOX:[id number] DOB: 03-23-1927 DOA: 03/19/2014 PCP: Thayer HeadingsMACKENZIE,BRIAN, MD  Assessment/Plan: 1-Acute respiratory Failure; multifactorial secondary to PNA and HF exacerbation.  Patient with increase work of breathing this morning. Will NSL fluids.  Start IV lasix 40 mg IV BID.  Daily weigh, I and O.   Health Care Associated PNA;  Presents with confusion, chest x ray with chronic inertial lung diseases, unable to rule out PNA.  Continue with Vancomycin and cefepime.   Acute Systolic Hf , last Ef 25 % by ECHO 2015.  Start IV lasix.  Hold Ace to avoid further hypotension.   Hyponatremia; improved. NSL fluids. Monitor.  Encephalopathy; deconditioning, hyponatremia, infectious process.   A fib; continue with amiodarone and eliquis.   COPD: start nebulizer treatments.   Code Status: Full Code.  Family Communication: Son, Richard Chavez updated. He is willing to speak with palliative care team for goals of care. He wants to think about CODE status.  Disposition Plan: Remain inpatient, need PT evaluation.    Consultants:  none  Procedures:  Doppler LE pending.   Antibiotics:  Vancomycin 11-13  Cefepime 11-13  HPI/Subjective: Patient sleepy but wake up and answer question. He is breathing fair. Has been coughing.  He sounds congested.   Objective: Filed Vitals:   03/20/14 0518  BP: 132/63  Pulse: 79  Temp: 98.4 F (36.9 C)  Resp: 20    Intake/Output Summary (Last 24 hours) at 03/20/14 0911 Last data filed at 03/20/14 0600  Gross per 24 hour  Intake 1139.17 ml  Output   3000 ml  Net -1860.83 ml   Filed Weights   03/19/14 1259 03/19/14 1636  Weight: 104.327 kg (230 lb) 102.558 kg (226 lb 1.6 oz)    Exam:   General:  Lethargic but wake up to answer questions.   Cardiovascular: S 1, S 2 RRR distant heart sound.   Respiratory: bilateral crackles, wheezing.   Abdomen: Bs present, soft, NT  Musculoskeletal: bilateral  plus 2 edema, mild erythema.   Data Reviewed: Basic Metabolic Panel:  Recent Labs Lab 03/19/14 1302  NA 126*  K 4.5  CL 90*  CO2 23  GLUCOSE 87  BUN 13  CREATININE 1.21  CALCIUM 8.0*   Liver Function Tests:  Recent Labs Lab 03/19/14 1302  AST 27  ALT 28  ALKPHOS 89  BILITOT 1.4*  PROT 6.4  ALBUMIN 2.6*   No results for input(s): LIPASE, AMYLASE in the last 168 hours. No results for input(s): AMMONIA in the last 168 hours. CBC:  Recent Labs Lab 03/19/14 1302  WBC 6.9  NEUTROABS 5.4  HGB 13.4  HCT 39.8  MCV 84.3  PLT 166   Cardiac Enzymes: No results for input(s): CKTOTAL, CKMB, CKMBINDEX, TROPONINI in the last 168 hours. BNP (last 3 results)  Recent Labs  01/27/14 0929 02/03/14 0915 02/20/14 1853  PROBNP 6726.0* 3422.0* 3557.0*   CBG: No results for input(s): GLUCAP in the last 168 hours.  No results found for this or any previous visit (from the past 240 hour(s)).   Studies: Dg Chest Port 1 View  03/19/2014   CLINICAL DATA:  Weakness, hypotensive  EXAM: PORTABLE CHEST - 1 VIEW  COMPARISON:  None.  FINDINGS: Chronic interstitial/airspace opacities, suspicious for chronic interstitial lung disease. Superimposed pneumonia is difficult to exclude. No pleural effusion or pneumothorax.  Cardiomegaly. Postsurgical changes related to prior CABG. Right subclavian pacemaker.  IMPRESSION: Suspected chronic interstitial lung disease. Superimposed pneumonia is difficult to exclude.  Electronically Signed   By: Charline Bills M.D.   On: 03/19/2014 13:05    Scheduled Meds: . amiodarone  400 mg Oral Daily  . apixaban  5 mg Oral BID  . atorvastatin  20 mg Oral Q breakfast  . ceFEPime (MAXIPIME) IV  1 g Intravenous 3 times per day  . famotidine  20 mg Oral Daily  . gabapentin  900 mg Oral BID  . lisinopril  1.25 mg Oral Daily  . loratadine  10 mg Oral Q breakfast  . metoprolol  100 mg Oral BID  . mometasone-formoterol  2 puff Inhalation BID  . predniSONE   5 mg Oral Daily  . vancomycin  1,500 mg Intravenous Q24H   Continuous Infusions:   Active Problems:   Essential hypertension   CKD (chronic kidney disease) stage 2, GFR 60-89 ml/min   CVA (cerebral infarction)   Acute respiratory failure   Cardiomyopathy   HCAP (healthcare-associated pneumonia)   Acute encephalopathy    Time spent: 35 minutes.     Hartley Barefoot A  [facility] Pager [phone]. If 7PM-7AM, please contact night-coverage at [email or web address], password [facility] 03/20/2014, 9:11 AM  LOS: 1 day

## 2014-03-20 NOTE — Progress Notes (Signed)
VASCULAR LAB PRELIMINARY  PRELIMINARY  PRELIMINARY  PRELIMINARY  Bilateral lower extremity venous Dopplers completed.    Preliminary report:  There is no DVT or SVT noted in the bilateral lower extremities.   Kharter Sestak, RVT 03/20/2014, 11:15 AM

## 2014-03-20 NOTE — Plan of Care (Signed)
Problem: Phase I Progression Outcomes Goal: Voiding-avoid urinary catheter unless indicated Outcome: Adequate for Discharge Pt has chronic foley catheter, will go home with this in place.

## 2014-03-20 NOTE — Plan of Care (Signed)
Problem: Phase II Progression Outcomes Goal: Encourage coughing & deep breathing Outcome: Progressing     

## 2014-03-20 NOTE — Progress Notes (Signed)
PT Cancellation Note  Patient Details Name: Richard Chavez MRN: [id number] DOB: 1927-03-04   Cancelled Treatment:    Reason Eval/Treat Not Completed: Medical issues which prohibited therapy (per RN, resp. distress, CHF. )   Sharen Heck PT [phone]  03/20/2014, 10:52 AM

## 2014-03-20 NOTE — Evaluation (Signed)
Clinical/Bedside Swallow Evaluation Patient Details  Name: Richard ShuttersFred M Canto MRN: [id number] Date of Birth: 05/27/1926  Today's Date: 03/20/2014 Time: 1328-1350 SLP Time Calculation (min) (ACUTE ONLY): 22 min  Past Medical History:  Past Medical History  Diagnosis Date  . CAD (coronary artery disease), native coronary artery     Status post CABG  . Hypertension   . Hyperlipidemia   . GERD (gastroesophageal reflux disease)   . COPD (chronic obstructive pulmonary disease)   . CRF (chronic renal failure)   . Barrett's esophagus   . OP (osteoporosis)   . CAD (coronary artery disease) of artery bypass graft 07/04/2011    LIMA-LAD; SVG-OM2-OM3, SVG-RCA -- all patent as of 05/27/11  . Syncope 07/04/2011  . Pacemaker 07/04/2011  . Cardiomyopathy, ischemic 07/2011; 09/2012    2D Echo - EF 35-40, mod dilated LA; Relook Echo 09/2012: EF 40-45%  . Myocardial infarction     25 yrs ago  . Peripheral vascular disease   . Arthritis   . BPH (benign prostatic hyperplasia)   . Polyp of colon     removed  . Sleep apnea     STOP BANG SCORE 4  . AV block, 3rd degree     Post St. Jude pacemaker, EF 35-40, does have intermittent atrial tachycardia, either PAt or Afib  . BPH (benign prostatic hypertrophy) 11/15/2011  . Paroxysmal atrial fibrillation - now rate controlled (formally with [facility] and Hospital) 10/03/2012  . CVA (cerebral infarction) 10/09/2012    Noted to be in atrial fibrillation with evaluation of pacemaker, prior to CVA. Right-sided MCA stroke with left arm weakness/mild paralysis -- notably improved   . Pneumonia 02/02/2014  . Shortness of breath   . Stroke     left hand weakness   Past Surgical History:  Past Surgical History  Procedure Laterality Date  . Pacemaker placement  2007  . Cholecystectomy  2005  . Coronary artery bypass graft  1978    X3  . Lesion excision      from penis  . Cardiac catheterization  06/06/2011    occluded proximal RCA, ostial LAD and mid Circumflex after OM 1.;  [place] LIMA-LAD, patent SVG-OM1-OM 2, patent SVG-RCA. Reduced ejection fraction of 30-35%.  . Cardiac catheterization  06/14/2010    LIMA to LAD, SVG to RCA, SVG to OM1 Circumflex, 100% occluded RCA, 100% occluded circumfkex, 100% occluded LAD, no evidence of graft dysfunction, continue medical therapy  . Cardiac catheterization  04/12/2003    3 vessel coronary artery disease, continue medical treatment  . Cardiac catheterization  10/07/2000    Severe 3 vessel coronary artery disease, continue medical treatment  . Doppler echocardiography  10/02/2012    EF 40-45%; possible grade 1 diastolic dysfunction, but A. fib precludes this. Mild atrial dilatation bilaterally. Moderately elevated pulmonary pressures of 40 mmHg.  . Prostate surgery     HPI:  80 year old male admitted 03/19/14 due to confusion and weakness for 2 days. PMH significant for HTN, COPD, PNA, GERD, Barrett's esophagus, RCVA (10/2012).   Assessment / Plan / Recommendation Clinical Impression  BSE completed, however, limited due to pt refusal of puree and solids. Pt currently reports upset stomach, not wanting anything, but willing to take a few sips of ginger ale. No overt s/s aspiration noted after thin liquids. Pt has been seen by SLP during previous admissions. Most recently, pt underwent FEES (01/28/14) with recommendation for regular diet with thin liquids, no straws. Will continue to recommend regular diet to allow  pt full range of choices, in light of recent nausea/stomach upset. ST to follow for diet tolerance.    Aspiration Risk  Moderate    Diet Recommendation Regular;Thin liquid   Liquid Administration via: No straw;Cup Supervision: Full supervision/cueing for compensatory strategies Compensations: Slow rate;Small sips/bites;Follow solids with liquid (begin meal with warm beverage) Postural Changes and/or Swallow Maneuvers: Seated upright 90 degrees;Upright 30-60 min after meal    Other  Recommendations Oral Care  Recommendations: Oral care Q4 per protocol Other Recommendations: Clarify dietary restrictions   Follow Up Recommendations  None    Frequency and Duration min 2x/week  1 week   Pertinent Vitals/Pain VSS, no pain reported    SLP Swallow Goals  po tolerance   Swallow Study Prior Functional Status   Seen by ST during previous admissions. FEES completed 01/2014, with rec for regular diet, thin liquids - NO straws, meds whole with puree.    General Date of Onset: 03/19/14 HPI: 80 year old male admitted 03/19/14 due to confusion and weakness for 2 days. PMH significant for HTN, COPD, PNA, GERD, Barrett's esophagus, RCVA (10/2012). Type of Study: Bedside swallow evaluation Previous Swallow Assessment: FEES 01/28/14 - rec regular diet with thin liquids, no straw Diet Prior to this Study: Regular;Thin liquids Temperature Spikes Noted: No Respiratory Status: Nasal cannula History of Recent Intubation: No Behavior/Cognition: Alert;Cooperative;Pleasant mood;Confused;Decreased sustained attention Oral Cavity - Dentition: Adequate natural dentition Self-Feeding Abilities: Needs assist Patient Positioning: Upright in bed Baseline Vocal Quality: Clear Volitional Cough: Weak Volitional Swallow: Unable to elicit    Oral/Motor/Sensory Function Overall Oral Motor/Sensory Function: Appears within functional limits for tasks assessed   Ice Chips Ice chips: Not tested   Thin Liquid Thin Liquid: Within functional limits Presentation: Cup    Nectar Thick Nectar Thick Liquid: Not tested   Honey Thick Honey Thick Liquid: Not tested   Puree Puree: Not tested Other Comments: pt refused due to upset stomach   Solid   GO   [name], [facility], CCC-SLP [phone]  Solid: Not tested Other Comments: pt refused due to upset stomach       Murvin Natal, Ruffin Pyo 03/20/2014,2:05 PM

## 2014-03-21 DIAGNOSIS — R531 Weakness: Secondary | ICD-10-CM | POA: Insufficient documentation

## 2014-03-21 DIAGNOSIS — R0689 Other abnormalities of breathing: Secondary | ICD-10-CM

## 2014-03-21 DIAGNOSIS — Z7189 Other specified counseling: Secondary | ICD-10-CM

## 2014-03-21 DIAGNOSIS — Z515 Encounter for palliative care: Secondary | ICD-10-CM

## 2014-03-21 DIAGNOSIS — R0600 Dyspnea, unspecified: Secondary | ICD-10-CM | POA: Insufficient documentation

## 2014-03-21 LAB — CBC
HCT: 39.1 % (ref 39.0–52.0)
HEMOGLOBIN: 13 g/dL (ref 13.0–17.0)
MCH: 28.2 pg (ref 26.0–34.0)
MCHC: 33.2 g/dL (ref 30.0–36.0)
MCV: 84.8 fL (ref 78.0–100.0)
Platelets: 143 10*3/uL — ABNORMAL LOW (ref 150–400)
RBC: 4.61 MIL/uL (ref 4.22–5.81)
RDW: 19.2 % — ABNORMAL HIGH (ref 11.5–15.5)
WBC: 6.6 10*3/uL (ref 4.0–10.5)

## 2014-03-21 LAB — BASIC METABOLIC PANEL
Anion gap: 13 (ref 5–15)
BUN: 11 mg/dL (ref 6–23)
CHLORIDE: 96 meq/L (ref 96–112)
CO2: 22 meq/L (ref 19–32)
CREATININE: 1.12 mg/dL (ref 0.50–1.35)
Calcium: 7.6 mg/dL — ABNORMAL LOW (ref 8.4–10.5)
GFR calc Af Amer: 67 mL/min — ABNORMAL LOW (ref >90)
GFR calc non Af Amer: 58 mL/min — ABNORMAL LOW (ref >90)
Glucose, Bld: 86 mg/dL (ref 70–99)
Potassium: 3.7 mEq/L (ref 3.7–5.3)
Sodium: 131 mEq/L — ABNORMAL LOW (ref 137–147)

## 2014-03-21 MED ORDER — LEVOFLOXACIN 750 MG PO TABS
750.0000 mg | ORAL_TABLET | Freq: Every day | ORAL | Status: DC
  Administered 2014-03-21 – 2014-03-22 (×2): 750 mg via ORAL
  Filled 2014-03-21 (×2): qty 1

## 2014-03-21 NOTE — Plan of Care (Signed)
Problem: Phase II Progression Outcomes Goal: Encourage coughing & deep breathing Outcome: Progressing     

## 2014-03-21 NOTE — Consult Note (Signed)
Patient ZO:XWRU:Richard Richard Chavez Richard Chavez      DOB: 1927-04-22      EAV:[id number]     Consult Note from the Palliative Medicine Team at [facility]    Consult Requested by: Dr Richard Richard Chavez Richard Chavez     PCP: Richard HeadingsMACKENZIE,BRIAN, MD Reason for Consultation:Goals of Care     Phone Number:[phone]  Assessment/Recommendations: 80 yo male with multiple medical problems as below. Presented with 3rd admission since Oct.  Palliative consulted for goal.     1.  Code Status: Full  2. Goals of Care: Richard Richard Chavez Richard Chavez has some confusion still and hard for him to participate in detailed discussions. His son Richard Richard Chavez Richard Chavez heard a pulmonologist would be coming by to evaluate if there is pulmnonary component to his multiple hospitalizations recently.  I wonder if his hospitalizations are more related to systolic HF exacerbation rather than PNA/COPD or other lung issue.  Certainly some multifactorial component though.  Richard Richard Chavez Richard Chavez has not been able to follow with his cardiologist as outpatient since these hospitalizations keep occuring.  Certainly his clinical trajectory is concerning.   He has been evaluated by PT who recommends SNF.  Richard Richard Chavez Richard Chavez is not sure how much this would improve things (confusion likely hindering his understanding). Talked about goals of SNF for rehab and perhaps monitoring his volume status as well.  Richard Richard Chavez Richard Chavez seems more on board with this than Richard Richard Chavez Richard Chavez. Despite some confusion, Richard Richard Chavez Richard Chavez has valid worries that decline will continue at nursing home based off what he has seen in past with other family/friends. Will continue to follow along and support them.  I think Richard Richard Chavez Richard Chavez is very interested in pulm consultation he was offered.    3. Symptom Management:   1. Weakness- medical management of multiple issues. PT eval recommended SNF.  2. Dyspnea- improved with diuresis.  He was not on home O2 and I suspect could be weaned off here.   4. Psychosocial/Spiritual: [place] in [place] with his son Richard Richard Chavez Richard Chavez.  Widowed. Had another son besides Richard Richard Chavez Richard Chavez who passed away  years ago.      Brief HPI: [age] yo male with PMHx of HTN, ICM/CAD, CKD, CHF (EF 25%) COPD who presented on 11/13 with 2 day history of confusion and generalized weakness. This is his 3rd admission in 1.465months. His 2 previous admissions were related to PNA, resp failure.  He was found to have acute hypoxic resp failure in setting of above issues plus likely PNA and acute systolic HF.  BNP elevated here and diuresing well with IV lasix. Richard Richard Chavez Richard Chavez remains confused at least mildly and has hard time telling me any history this afternoon.  Reports that he is feeling better and breathing a little better as well.    I spoke with his son Richard Richard Chavez Richard Chavez at bedside as well.  He reports that Richard Richard Chavez Richard Chavez was doing well until few months ago when the hospitalizations started.  Reportedly Richard Richard Chavez Richard Chavez was driving and able to ambulate independently prior to these multiple hospitalizations. Has now needed to use walker and overall less ambulatory. He reports that Richard Richard Chavez Richard Chavez is compliant with diuretics and he tries to avoid putting salt in his food.  His confusion seems to occur around setbacks like this in his health.  Has been following with urologist because of urinary retention since last hospitalization requiring foley.      PMH:  Past Medical History  Diagnosis Date  . CAD (coronary artery disease), native coronary artery     Status post CABG  . Hypertension   . Hyperlipidemia   . GERD (gastroesophageal reflux disease)   .  COPD (chronic obstructive pulmonary disease)   . CRF (chronic renal failure)   . Barrett's esophagus   . OP (osteoporosis)   . CAD (coronary artery disease) of artery bypass graft 07/04/2011    LIMA-LAD; SVG-OM2-OM3, SVG-RCA -- all patent as of 05/27/11  . Syncope 07/04/2011  . Pacemaker 07/04/2011  . Cardiomyopathy, ischemic 07/2011; 09/2012    2D Echo - EF 35-40, mod dilated LA; Relook Echo 09/2012: EF 40-45%  . Myocardial infarction     25 yrs ago  . Peripheral vascular disease   . Arthritis   . BPH (benign prostatic  hyperplasia)   . Polyp of colon     removed  . Sleep apnea     STOP BANG SCORE 4  . AV block, 3rd degree     Post [employer] pacemaker, EF 35-40, does have intermittent atrial tachycardia, either PAt or Afib  . BPH (benign prostatic hypertrophy) 11/15/2011  . Paroxysmal atrial fibrillation - now rate controlled (formally with [facility] and Hospital) 10/03/2012  . CVA (cerebral infarction) 10/09/2012    Noted to be in atrial fibrillation with evaluation of pacemaker, prior to CVA. Right-sided MCA stroke with left arm weakness/mild paralysis -- notably improved   . Pneumonia 02/02/2014  . Shortness of breath   . Stroke     left hand weakness     PSH: Past Surgical History  Procedure Laterality Date  . Pacemaker placement  2007  . Cholecystectomy  2005  . Coronary artery bypass graft  1978    X3  . Lesion excision      from penis  . Cardiac catheterization  06/06/2011    occluded proximal RCA, ostial LAD and mid Circumflex after OM 1.; [place] LIMA-LAD, patent SVG-OM1-OM 2, patent SVG-RCA. Reduced ejection fraction of 30-35%.  . Cardiac catheterization  06/14/2010    LIMA to LAD, SVG to RCA, SVG to OM1 Circumflex, 100% occluded RCA, 100% occluded circumfkex, 100% occluded LAD, no evidence of graft dysfunction, continue medical therapy  . Cardiac catheterization  04/12/2003    3 vessel coronary artery disease, continue medical treatment  . Cardiac catheterization  10/07/2000    Severe 3 vessel coronary artery disease, continue medical treatment  . Doppler echocardiography  10/02/2012    EF 40-45%; possible grade 1 diastolic dysfunction, but A. fib precludes this. Mild atrial dilatation bilaterally. Moderately elevated pulmonary pressures of 40 mmHg.  . Prostate surgery     I have reviewed the FH and SH and  If appropriate update it with new information. No Known Allergies Scheduled Meds: . amiodarone  400 mg Oral Daily  . apixaban  5 mg Oral BID  . atorvastatin  20 mg Oral Q breakfast  .  famotidine  20 mg Oral Daily  . furosemide  40 mg Intravenous Q12H  . gabapentin  900 mg Oral BID  . ipratropium-albuterol  3 mL Nebulization QID  . levofloxacin  750 mg Oral Daily  . loratadine  10 mg Oral Q breakfast  . metoprolol  100 mg Oral BID  . mometasone-formoterol  2 puff Inhalation BID  . predniSONE  5 mg Oral Daily   Continuous Infusions:  PRN Meds:.albuterol, fluticasone    BP 101/57 mmHg  Pulse 75  Temp(Src) 98 F (36.7 C) (Oral)  Resp 20  Ht 6' (1.829 m)  Wt 102.558 kg (226 lb 1.6 oz)  BMI 30.66 kg/m2  SpO2 95%   PPS: 50   Intake/Output Summary (Last 24 hours) at 03/21/14 1614 Last data filed  at 03/21/14 1507  Gross per 24 hour  Intake    820 ml  Output   3675 ml  Net  -2855 ml    Physical Exam:  General: Alert, NAD HEENT:  [place], sclera anicteric, mmm Chest:   CTAB CVS: mild tachy Abdomen: soft, ND Ext: edematous Neuro: mild confusion.   Labs: CBC    Component Value Date/Time   WBC 6.6 03/21/2014 0604   RBC 4.61 03/21/2014 0604   HGB 13.0 03/21/2014 0604   HCT 39.1 03/21/2014 0604   PLT 143* 03/21/2014 0604   MCV 84.8 03/21/2014 0604   MCH 28.2 03/21/2014 0604   MCHC 33.2 03/21/2014 0604   RDW 19.2* 03/21/2014 0604   LYMPHSABS 0.8 03/19/2014 1302   MONOABS 0.6 03/19/2014 1302   EOSABS 0.1 03/19/2014 1302   BASOSABS 0.0 03/19/2014 1302    BMET    Component Value Date/Time   NA 131* 03/21/2014 0604   K 3.7 03/21/2014 0604   CL 96 03/21/2014 0604   CO2 22 03/21/2014 0604   GLUCOSE 86 03/21/2014 0604   BUN 11 03/21/2014 0604   CREATININE 1.12 03/21/2014 0604   CREATININE 1.68* 04/23/2013 1221   CALCIUM 7.6* 03/21/2014 0604   GFRNONAA 58* 03/21/2014 0604   GFRAA 67* 03/21/2014 0604    CMP     Component Value Date/Time   NA 131* 03/21/2014 0604   K 3.7 03/21/2014 0604   CL 96 03/21/2014 0604   CO2 22 03/21/2014 0604   GLUCOSE 86 03/21/2014 0604   BUN 11 03/21/2014 0604   CREATININE 1.12 03/21/2014 0604   CREATININE  1.68* 04/23/2013 1221   CALCIUM 7.6* 03/21/2014 0604   PROT 6.4 03/19/2014 1302   ALBUMIN 2.6* 03/19/2014 1302   AST 27 03/19/2014 1302   ALT 28 03/19/2014 1302   ALKPHOS 89 03/19/2014 1302   BILITOT 1.4* 03/19/2014 1302   GFRNONAA 58* 03/21/2014 0604   GFRAA 67* 03/21/2014 0604    11/13 CXR IMPRESSION: Suspected chronic interstitial lung disease. Superimposed pneumonia is difficult to exclude.    Total Time: 55 minutes  Greater than 50%  of this time was spent counseling and coordinating care related to the above assessment and plan.   Orvis Brill D.O. Palliative Medicine Team at [facility]  Pager: [phone] Team Phone: [phone]

## 2014-03-21 NOTE — Evaluation (Signed)
Physical Therapy Evaluation Patient Details Name: Richard Chavez MRN: [id number] DOB: 05/09/1926 Today's Date: 03/21/2014   History of Present Illness  Richard Chavez is a 80 y.o. male with prior h/o  hypertension, Ischemic cardiomyopathy, CRF, COPD, comes in for confusion and generalized weakness for 2 days. . He had 2 episodes of pneumonia in the last 3 months. On arrival to ED 03/19/14 he was found to be hypoxic, and his CXR revealed chronic lung disease with questionable superimposed pneumonia.   Clinical Impression  Patient pleasantly confused, very weak on L extremeties, difficulty standing. Patient will benefit from PT to address problems listed. No family present to address DC plans.   Follow Up Recommendations SNF    Equipment Recommendations  None recommended by PT    Recommendations for Other Services       Precautions / Restrictions Precautions Precautions: Fall Precaution Comments: monitor sats      Mobility  Bed Mobility Overal bed mobility: Needs Assistance Bed Mobility: Supine to Sit     Supine to sit: Max assist;HOB elevated;+2 for safety/equipment     General bed mobility comments: assist fro legs over edge and to get trunk upright position, scoot to edge  Transfers Overall transfer level: Needs assistance Equipment used: Rolling walker (2 wheeled) Transfers: Sit to/from Stand Sit to Stand: +2 safety/equipment;+2 physical assistance;Total assist;Max assist         General transfer comment: stood  from elevated bed at [facility] x 2 with mod assist, required max of 2 from recliner due to lower surface, stedy used for safe transfer as legs deemed not strong enough for standiong and pivoting.  Ambulation/Gait                Stairs            Wheelchair Mobility    Modified Rankin (Stroke Patients Only)       Balance Overall balance assessment: Needs assistance Sitting-balance support: No upper extremity supported;Feet supported Sitting  balance-Leahy Scale: Fair     Standing balance support: During functional activity;Bilateral upper extremity supported Standing balance-Leahy Scale: Poor                               Pertinent Vitals/Pain Pain Assessment: No/denies pain    Home Living   Living Arrangements: Children Available Help at Discharge: Family;Available 24 hours/day Type of Home: House Home Access: Stairs to enter Entrance Stairs-Rails: None   Home Layout: One level Home Equipment: Grab bars - tub/shower;Walker - 2 wheels;Cane - single point;Shower seat;Bedside commode;Wheelchair - manual      Prior Function Level of Independence: Needs assistance         Comments: no family present for update on function since last  admit. 3 weeks ago     Hand Dominance        Extremity/Trunk Assessment   Upper Extremity Assessment: LUE deficits/detail       LUE Deficits / Details: baseline weakness and incoordination from prior CVA, unable to maintain grip on RW/Stedy   Lower Extremity Assessment: LLE deficits/detail   LLE Deficits / Details: decreased knee extension held against gravity     Communication      Cognition Arousal/Alertness: Awake/alert Behavior During Therapy: WFL for tasks assessed/performed Overall Cognitive Status: Impaired/Different from baseline Area of Impairment: Orientation Orientation Level: Time;Situation;Place (did know she he was in [place])   Memory: Decreased short-term memory Following Commands: Follows one step commands  inconsistently Safety/Judgement: Decreased awareness of safety;Decreased awareness of deficits     General Comments: difficulty figuring out how to stand up from bed, poor historian    General Comments      Exercises        Assessment/Plan    PT Assessment Patient needs continued PT services  PT Diagnosis Generalized weakness;Hemiplegia non-dominant side   PT Problem List Decreased strength;Decreased activity  tolerance;Decreased balance;Decreased mobility;Decreased cognition;Decreased knowledge of use of DME  PT Treatment Interventions DME instruction;Functional mobility training;Therapeutic activities;Therapeutic exercise;Balance training;Cognitive remediation;Patient/family education   PT Goals (Current goals can be found in the Care Plan section) Acute Rehab PT Goals Patient Stated Goal: agreed to getting OOB PT Goal Formulation: Patient unable to participate in goal setting Time For Goal Achievement: 04/04/14 Potential to Achieve Goals: Fair    Frequency Min 3X/week   Barriers to discharge        Co-evaluation               End of Session   Activity Tolerance: Patient tolerated treatment well Patient left: in chair;with call bell/phone within reach;with chair alarm set Nurse Communication: Mobility status;Need for lift equipment         Time: [id number] PT Time Calculation (min) (ACUTE ONLY): 30 min   Charges:   PT Evaluation $Initial PT Evaluation Tier I: 1 Procedure PT Treatments $Therapeutic Activity: 23-37 mins   PT G Codes:          Rada HayHill, Arieh Bogue Elizabeth 03/21/2014, 3:29 PM Blanchard KelchKaren Crescencio Jozwiak PT [phone]

## 2014-03-21 NOTE — Plan of Care (Signed)
Problem: Phase II Progression Outcomes Goal: Tolerating diet Outcome: Completed/Met Date Met:  03/21/14     

## 2014-03-21 NOTE — Progress Notes (Signed)
[facility] PROGRESS NOTE  Richard Chavez KDX:[id number] DOB: 04-Jan-1927 DOA: 03/19/2014 PCP: Richard Headings, MD  Assessment/Plan: Acute hypoxemic respiratory failure -Multifactorial secondary to pneumonia and acute systolic CHF. -Wean oxygen as tolerated. -Please see below for details.  Hospital acquired Pneumonia -No fever, cough improved,no leukocytosis. -Transition IV antibiotics over to oral Levaquin to complete a total of 10 days of treatment. -Has completed 6 days of IV vancomycin and cefepime  Acute on chronic systolic CHF -Per last 2-D echo his ejection fraction is 25% -is 6.1 L negative since admission. -Shortness of breath has improved. -ACE inhibitor on hold secondary to hypotension.  Acute encephalopathy  -Per son Richard Chavez improving and approaching baseline.  Atrial fibrillation -Rate controlled.  -Continue amiodarone and Ellik was.  Code Status: full code Family Communication: son Richard Chavez at bedside updated on plan of care.  Disposition Plan: home versus SNF in a.m.   Consultants:  none   Antibiotics:  Levaquin   Subjective: No complaints  Objective: Filed Vitals:   03/21/14 0846 03/21/14 0942 03/21/14 1226 03/21/14 1503  BP:  125/68  101/57  Pulse:  78  75  Temp:    98 F (36.7 C)  TempSrc:    Oral  Resp:    20  Height:      Weight:      SpO2: 97%  90% 98%    Intake/Output Summary (Last 24 hours) at 03/21/14 1557 Last data filed at 03/21/14 1507  Gross per 24 hour  Intake    820 ml  Output   3675 ml  Net  -2855 ml   Filed Weights   03/19/14 1259 03/19/14 1636  Weight: 104.327 kg (230 lb) 102.558 kg (226 lb 1.6 oz)    Exam:   General:  Awake, alert  Cardiovascular: irregular, no rubs or gallops  Respiratory: clear to auscultation bilaterally  Abdomen: soft, nontender, nondistended, positive bowel sounds  Extremities: 1+ pitting edema bilaterally   Neurologic:  nonfocal  Data Reviewed: Basic Metabolic  Panel:  Recent Labs Lab 03/19/14 1302 03/20/14 0500 03/21/14 0604  NA 126* 134* 131*  K 4.5 4.3 3.7  CL 90* 101 96  CO2 23 21 22   GLUCOSE 87 73 86  BUN 13 11 11   CREATININE 1.21 1.12 1.12  CALCIUM 8.0* 7.4* 7.6*   Liver Function Tests:  Recent Labs Lab 03/19/14 1302  AST 27  ALT 28  ALKPHOS 89  BILITOT 1.4*  PROT 6.4  ALBUMIN 2.6*   No results for input(s): LIPASE, AMYLASE in the last 168 hours. No results for input(s): AMMONIA in the last 168 hours. CBC:  Recent Labs Lab 03/19/14 1302 03/21/14 0604  WBC 6.9 6.6  NEUTROABS 5.4  --   HGB 13.4 13.0  HCT 39.8 39.1  MCV 84.3 84.8  PLT 166 143*   Cardiac Enzymes: No results for input(s): CKTOTAL, CKMB, CKMBINDEX, TROPONINI in the last 168 hours. BNP (last 3 results)  Recent Labs  02/03/14 0915 02/20/14 1853 03/20/14 0930  PROBNP 3422.0* 3557.0* 8409.0*   CBG: No results for input(s): GLUCAP in the last 168 hours.  Recent Results (from the past 240 hour(s))  Blood Culture (routine x 2)     Status: None (Preliminary result)   Collection Time: 03/19/14 12:33 PM  Result Value Ref Range Status   Specimen Description BLOOD RIGHT ANTECUBITAL  Final   Special Requests BOTTLES DRAWN AEROBIC AND ANAEROBIC  Final   Culture  Setup Time   Final    03/19/2014  16:33 Performed at [facility]    Culture   Final           BLOOD CULTURE RECEIVED NO GROWTH TO DATE CULTURE WILL BE HELD FOR 5 DAYS BEFORE ISSUING A FINAL NEGATIVE REPORT Performed at [facility]    Report Status PENDING  Incomplete  Urine culture     Status: None   Collection Time: 03/19/14 12:49 PM  Result Value Ref Range Status   Specimen Description URINE, CATHETERIZED  Final   Special Requests NONE  Final   Culture  Setup Time   Final    03/19/2014 13:54 Performed at [facility]    Colony Count   Final    50,000 COLONIES/ML Performed at [facility]    Culture YEAST Performed at [facility]    Final   Report Status 03/20/2014 FINAL  Final  Blood Culture (routine x 2)     Status: None (Preliminary result)   Collection Time: 03/19/14  1:03 PM  Result Value Ref Range Status   Specimen Description BLOOD RIGHT ARM  Final   Special Requests BOTTLES DRAWN AEROBIC AND ANAEROBIC 3ML  Final   Culture  Setup Time   Final    03/19/2014 16:33 Performed at [facility]    Culture   Final           BLOOD CULTURE RECEIVED NO GROWTH TO DATE CULTURE WILL BE HELD FOR 5 DAYS BEFORE ISSUING A FINAL NEGATIVE REPORT Performed at [facility]    Report Status PENDING  Incomplete     Studies: No results found.  Scheduled Meds: . amiodarone  400 mg Oral Daily  . apixaban  5 mg Oral BID  . atorvastatin  20 mg Oral Q breakfast  . famotidine  20 mg Oral Daily  . furosemide  40 mg Intravenous Q12H  . gabapentin  900 mg Oral BID  . ipratropium-albuterol  3 mL Nebulization QID  . levofloxacin  750 mg Oral Daily  . loratadine  10 mg Oral Q breakfast  . metoprolol  100 mg Oral BID  . mometasone-formoterol  2 puff Inhalation BID  . predniSONE  5 mg Oral Daily   Continuous Infusions:   Active Problems:   Essential hypertension   CKD (chronic kidney disease) stage 2, GFR 60-89 ml/min   CVA (cerebral infarction)   Acute respiratory failure   Cardiomyopathy   HCAP (healthcare-associated pneumonia)   Acute encephalopathy    Time spent: 35 minutes. Greater than 50% of this time was spent in direct contact with the patient coordinating care.    Richard Chavez,Richard Chavez  [facility] Pager [phone]  If 7PM-7AM, please contact night-coverage at [email or web address], password [email or web address] 03/21/2014, 3:57 PM  LOS: 2 days

## 2014-03-22 LAB — LEGIONELLA ANTIGEN, URINE

## 2014-03-22 LAB — BASIC METABOLIC PANEL
Anion gap: 13 (ref 5–15)
BUN: 14 mg/dL (ref 6–23)
CO2: 23 mEq/L (ref 19–32)
CREATININE: 1.11 mg/dL (ref 0.50–1.35)
Calcium: 7.9 mg/dL — ABNORMAL LOW (ref 8.4–10.5)
Chloride: 93 mEq/L — ABNORMAL LOW (ref 96–112)
GFR calc Af Amer: 68 mL/min — ABNORMAL LOW (ref >90)
GFR calc non Af Amer: 59 mL/min — ABNORMAL LOW (ref >90)
GLUCOSE: 83 mg/dL (ref 70–99)
Potassium: 3.3 mEq/L — ABNORMAL LOW (ref 3.7–5.3)
Sodium: 129 mEq/L — ABNORMAL LOW (ref 137–147)

## 2014-03-22 LAB — CBC
HCT: 37.1 % — ABNORMAL LOW (ref 39.0–52.0)
Hemoglobin: 12.4 g/dL — ABNORMAL LOW (ref 13.0–17.0)
MCH: 28.2 pg (ref 26.0–34.0)
MCHC: 33.4 g/dL (ref 30.0–36.0)
MCV: 84.5 fL (ref 78.0–100.0)
Platelets: 164 10*3/uL (ref 150–400)
RBC: 4.39 MIL/uL (ref 4.22–5.81)
RDW: 18.9 % — AB (ref 11.5–15.5)
WBC: 6.5 10*3/uL (ref 4.0–10.5)

## 2014-03-22 NOTE — Plan of Care (Signed)
Problem: Phase I Progression Outcomes Goal: Other Phase I Outcomes/Goals Outcome: Not Applicable Date Met:  03/22/14     

## 2014-03-22 NOTE — Progress Notes (Signed)
Clinical Social Work Department CLINICAL SOCIAL WORK PLACEMENT NOTE 03/22/2014  Patient:  Richard Chavez, Richard Chavez  Account Number:  [id number] Admit date:  03/19/2014  Clinical Social Worker:  Hampton Abbot, CLINICAL SOCIAL WORKER  Date/time:  03/22/2014 11:02 AM  Clinical Social Work is seeking post-discharge placement for this patient at the following level of care:   SKILLED NURSING   (*CSW will update this form in Epic as items are completed)   03/22/2014  Patient/family provided with [facility] Department of Clinical Social Work's list of facilities offering this level of care within the geographic area requested by the patient (or if unable, by the patient's family).  03/22/2014  Patient/family informed of their freedom to choose among providers that offer the needed level of care, that participate in Medicare, Medicaid or managed care program needed by the patient, have an available bed and are willing to accept the patient.  03/22/2014  Patient/family informed of [facility]' ownership interest in [facility], as well as of the fact that they are under no obligation to receive care at this facility.  PASARR submitted to EDS on 03/22/2014 PASARR number received on 03/22/2014  FL2 transmitted to all facilities in geographic area requested by pt/family on  03/22/2014 FL2 transmitted to all facilities within larger geographic area on   Patient informed that his/her managed care company has contracts with or will negotiate with  certain facilities, including the following:     Patient/family informed of bed offers received:  03/22/2014 Patient chooses bed at [facility] Physician recommends and patient chooses bed at    Patient to be transferred to  on   Patient to be transferred to facility by  Patient and family notified of transfer on  Name of family member notified:  Janeece Riggers  The following physician request were entered in Epic:   Additional  Comments:      Hampton Abbot BSW Intern

## 2014-03-22 NOTE — Progress Notes (Signed)
Patient is set to discharge to [facility] today. Patient & son, [name] at bedside aware. Discharge packet given to RN, Susie. [facility] called for transport.   Clinical Social Work Department CLINICAL SOCIAL WORK PLACEMENT NOTE 03/22/2014  Patient:  SHEENA, WURM  Account Number:  [id number] Admit date:  03/19/2014  Clinical Social Worker:  Hampton Abbot, CLINICAL SOCIAL WORKER  Date/time:  03/22/2014 11:02 AM  Clinical Social Work is seeking post-discharge placement for this patient at the following level of care:   SKILLED NURSING   (*CSW will update this form in Epic as items are completed)   03/22/2014  Patient/family provided with [facility] Department of Clinical Social Work's list of facilities offering this level of care within the geographic area requested by the patient (or if unable, by the patient's family).  03/22/2014  Patient/family informed of their freedom to choose among providers that offer the needed level of care, that participate in Medicare, Medicaid or managed care program needed by the patient, have an available bed and are willing to accept the patient.  03/22/2014  Patient/family informed of [facility]' ownership interest in [facility], as well as of the fact that they are under no obligation to receive care at this facility.  PASARR submitted to EDS on 03/22/2014 PASARR number received on 03/22/2014  FL2 transmitted to all facilities in geographic area requested by pt/family on  03/22/2014 FL2 transmitted to all facilities within larger geographic area on   Patient informed that his/her managed care company has contracts with or will negotiate with  certain facilities, including the following:     Patient/family informed of bed offers received:  03/22/2014 Patient chooses bed at [facility] Physician recommends and patient chooses bed at    Patient to be transferred to [facility] on  03/22/2014 Patient to be transferred to facility by  [facility] Patient and family notified of transfer on 03/22/2014 Name of family member notified:  Janeece Riggers at bedside  The following physician request were entered in Epic:   Additional Comments:   Lincoln Maxin, LCSW [facility] Clinical Social Worker cell #: [phone]

## 2014-03-22 NOTE — Discharge Summary (Signed)
Physician Discharge Summary  Richard Chavez ZOX:[id number] DOB: [birth year] DOA: 03/19/2014  PCP: Thayer Headings, MD  Admit date: 03/19/2014 Discharge date: 03/22/2014  Time spent: 45 minutes  Recommendations for Outpatient Follow-up:  -will be discharged to skilled nursing facility today.  Discharge Diagnoses:  Active Problems:   Essential hypertension   CKD (chronic kidney disease) stage 2, GFR 60-89 ml/min   CVA (cerebral infarction)   Acute respiratory failure   Cardiomyopathy   HCAP (healthcare-associated pneumonia)   Acute encephalopathy   Dyspnea and respiratory abnormality   Palliative care encounter   Weakness   Goals of care, counseling/discussion   Discharge Condition: stable and improved  Filed Weights   03/19/14 1259 03/19/14 1636  Weight: 104.327 kg (230 lb) 102.558 kg (226 lb 1.6 oz)    History of present illness:  Richard Chavez is a 80 y.o. male with prior h/o hypertension, Ischemic cardiomyopathy, CRF, COPD, comes in for confusion and generalized weakness for 2 days. Patient is a poor historian and most of the history was obtained from his son at bedside. He had 2 episodes of pneumonia in the last 3 months. On arrival to ED, he was found to be hypoxic, and his CXR revealed chronic lung disease with questionable superimposed pneumonia. He was referred for admission to medical service   Hospital Course:   Acute hypoxemic respiratory failure -Multifactorial secondary to pneumonia and acute systolic CHF. -no longer has oxygen requirements. -Please see below for details.  Hospital acquired Pneumonia -No fever, cough improved,no leukocytosis. -has completed a course of antibiotics and no longer requires antibiotics on discharge..  Acute on chronic systolic CHF -Per last 2-D echo his ejection fraction is 25% -is 5.9 L negative since admission. -Shortness of breath has Resolved. -ACE inhibitor on hold secondary to hypotension.  Acute encephalopathy    -Per son [name] improving and approaching baseline, although he remains confused.  Atrial fibrillation -Rate controlled.  -Continue amiodarone and Eliquis.  Procedures:  None   Consultations:  none  Discharge Instructions  Discharge Instructions    Diet - low sodium heart healthy    Complete by:  As directed      Increase activity slowly    Complete by:  As directed             Medication List    STOP taking these medications        potassium chloride 10 MEQ tablet  Commonly known as:  K-DUR,KLOR-CON      TAKE these medications        ADVAIR DISKUS 500-50 MCG/DOSE Aepb  Generic drug:  Fluticasone-Salmeterol  Inhale 1 puff into the lungs every 12 (twelve) hours.     amiodarone 400 MG tablet  Commonly known as:  PACERONE  Take 1 tablet (400 mg total) by mouth daily.     apixaban 5 MG Tabs tablet  Commonly known as:  ELIQUIS  Take 1 tablet (5 mg total) by mouth 2 (two) times daily.     atorvastatin 20 MG tablet  Commonly known as:  LIPITOR  Take 20 mg by mouth daily with breakfast.     COMBIVENT 18-103 MCG/ACT inhaler  Generic drug:  albuterol-ipratropium  Inhale 2 puffs into the lungs 2 (two) times daily.     Fish Oil 1000 MG Caps  Take 1,000 mg by mouth daily.     fluticasone 50 MCG/ACT nasal spray  Commonly known as:  FLONASE  Place 1 spray into both nostrils daily as needed for  allergies or rhinitis.     furosemide 40 MG tablet  Commonly known as:  LASIX  Take 1 tablet (40 mg total) by mouth daily.     gabapentin 300 MG capsule  Commonly known as:  NEURONTIN  Take 900 mg by mouth 2 (two) times daily.     guaiFENesin 600 MG 12 hr tablet  Commonly known as:  MUCINEX  Take 1,200 mg by mouth 2 (two) times daily.     lisinopril 2.5 MG tablet  Commonly known as:  PRINIVIL,ZESTRIL  Take 0.5 tablets (1.25 mg total) by mouth daily.     loratadine 10 MG tablet  Commonly known as:  CLARITIN  Take 10 mg by mouth daily with breakfast.      metoprolol 100 MG tablet  Commonly known as:  LOPRESSOR  Take 100 mg by mouth 2 (two) times daily.     potassium chloride 20 MEQ/15ML (10%) solution  Take 15 mLs (20 mEq total) by mouth daily.     predniSONE 5 MG tablet  Commonly known as:  DELTASONE  Take 5 mg by mouth daily.     ranitidine 150 MG capsule  Commonly known as:  ZANTAC  Take 150 mg by mouth 2 (two) times daily.     SLOW-MAG 535 (64 MG) MG Tbcr  Generic drug:  Magnesium Chloride  Take 64 mg by mouth 2 (two) times daily.     tiotropium 18 MCG inhalation capsule  Commonly known as:  SPIRIVA  Place 18 mcg into inhaler and inhale daily with breakfast.     Vitamin D (Ergocalciferol) 50000 UNITS Caps capsule  Commonly known as:  DRISDOL  Take 50,000 Units by mouth every 7 (seven) days. Take on Fridays     vitamin E 1000 UNIT capsule  Take 1,000 Units by mouth daily with breakfast.       No Known Allergies    The results of significant diagnostics from this hospitalization (including imaging, microbiology, ancillary and laboratory) are listed below for reference.    Significant Diagnostic Studies: Ct Head Wo Contrast  02/20/2014   CLINICAL DATA:  Altered mental status, initial evaluation  EXAM: CT HEAD WITHOUT CONTRAST  TECHNIQUE: Contiguous axial images were obtained from the base of the skull through the vertex without intravenous contrast.  COMPARISON:  10/02/2012  FINDINGS: Moderate diffuse atrophy. Moderate low attenuation in the deep white matter. Focal encephalomalacia right parietal lobe stable. No evidence of hemorrhage or extra-axial fluid. No evidence of acute vascular territory infarct. Calvarium is intact. No significant inflammatory change in the visualized portions of the sinuses.  IMPRESSION: Chronic involutional change and prior infarct on the right with no acute findings.   Electronically Signed   By: Esperanza Heir M.D.   On: 02/20/2014 20:20   Dg Chest Port 1 View  03/19/2014   CLINICAL DATA:   Weakness, hypotensive  EXAM: PORTABLE CHEST - 1 VIEW  COMPARISON:  None.  FINDINGS: Chronic interstitial/airspace opacities, suspicious for chronic interstitial lung disease. Superimposed pneumonia is difficult to exclude. No pleural effusion or pneumothorax.  Cardiomegaly. Postsurgical changes related to prior CABG. Right subclavian pacemaker.  IMPRESSION: Suspected chronic interstitial lung disease. Superimposed pneumonia is difficult to exclude.   Electronically Signed   By: Charline Bills M.D.   On: 03/19/2014 13:05   Dg Chest Port 1 View  02/22/2014   CLINICAL DATA:  Pneumonia, shortness of breath, personal history of coronary artery disease post CABG, hypertension, COPD, chronic renal failure, ischemic cardiomyopathy, prior MI and  stroke  EXAM: PORTABLE CHEST - 1 VIEW  COMPARISON:  Portable exam 0454 hr compared to 02/20/2014  FINDINGS: RIGHT subclavian sequential transvenous pacemaker leads project over RIGHT atrium and RIGHT ventricle, unchanged.  Enlargement of cardiac silhouette post CABG.  Stable mediastinal contours with atherosclerotic calcification at aortic arch.  Patchy infiltrates in BILATERAL upper lobes again noted.  Underlying emphysematous changes.  Minimal bibasilar atelectasis.  No new infiltrate, pleural effusion or pneumothorax.  IMPRESSION: Enlargement of cardiac silhouette post CABG and pacemaker.  COPD changes with persistent upper lobe infiltrates bilaterally.   Electronically Signed   By: Ulyses Southward M.D.   On: 02/22/2014 07:49   Dg Chest Portable 1 View  02/20/2014   CLINICAL DATA:  Cough. Weakness. Altered mental status. Ex-smoker.  EXAM: PORTABLE CHEST - 1 VIEW  COMPARISON:  02/04/2014.  FINDINGS: Stable enlarged cardiac silhouette, post CABG changes and right subclavian pacemaker leads. Stable prominence of the interstitial markings. No pleural fluid. Further decrease in right upper lobe airspace opacity. Interval mild airspace opacity in the lateral aspect of the left  mid to upper lung zone. Unremarkable bones.  IMPRESSION: 1. Interval mild pneumonia in the left mid to upper lung zone. 2. Further improvement in right upper lobe pneumonia. 3. Stable cardiomegaly and chronic interstitial lung disease.   Electronically Signed   By: Gordan Payment M.D.   On: 02/20/2014 19:41    Microbiology: Recent Results (from the past 240 hour(s))  Blood Culture (routine x 2)     Status: None (Preliminary result)   Collection Time: 03/19/14 12:33 PM  Result Value Ref Range Status   Specimen Description BLOOD RIGHT ANTECUBITAL  Final   Special Requests BOTTLES DRAWN AEROBIC AND ANAEROBIC  Final   Culture  Setup Time   Final    03/19/2014 16:33 Performed at [facility]    Culture   Final           BLOOD CULTURE RECEIVED NO GROWTH TO DATE CULTURE WILL BE HELD FOR 5 DAYS BEFORE ISSUING A FINAL NEGATIVE REPORT Performed at [facility]    Report Status PENDING  Incomplete  Urine culture     Status: None   Collection Time: 03/19/14 12:49 PM  Result Value Ref Range Status   Specimen Description URINE, CATHETERIZED  Final   Special Requests NONE  Final   Culture  Setup Time   Final    03/19/2014 13:54 Performed at [facility] Count   Final    50,000 COLONIES/ML Performed at [facility]    Culture YEAST Performed at [facility]   Final   Report Status 03/20/2014 FINAL  Final  Blood Culture (routine x 2)     Status: None (Preliminary result)   Collection Time: 03/19/14  1:03 PM  Result Value Ref Range Status   Specimen Description BLOOD RIGHT ARM  Final   Special Requests BOTTLES DRAWN AEROBIC AND ANAEROBIC  Final   Culture  Setup Time   Final    03/19/2014 16:33 Performed at [facility]    Culture   Final           BLOOD CULTURE RECEIVED NO GROWTH TO DATE CULTURE WILL BE HELD FOR 5 DAYS BEFORE ISSUING A FINAL NEGATIVE REPORT Performed at [facility]    Report Status PENDING   Incomplete     Labs: Basic Metabolic Panel:  Recent Labs Lab 03/19/14 1302 03/20/14 0500 03/21/14 0604 03/22/14 0424  NA 126* 134* 131* 129*  K 4.5 4.3 3.7 3.3*  CL 90* 101 96 93*  CO2 23 21 22 23   GLUCOSE 87 73 86 83  BUN 13 11 11 14   CREATININE 1.21 1.12 1.12 1.11  CALCIUM 8.0* 7.4* 7.6* 7.9*   Liver Function Tests:  Recent Labs Lab 03/19/14 1302  AST 27  ALT 28  ALKPHOS 89  BILITOT 1.4*  PROT 6.4  ALBUMIN 2.6*   No results for input(s): LIPASE, AMYLASE in the last 168 hours. No results for input(s): AMMONIA in the last 168 hours. CBC:  Recent Labs Lab 03/19/14 1302 03/21/14 0604 03/22/14 0424  WBC 6.9 6.6 6.5  NEUTROABS 5.4  --   --   HGB 13.4 13.0 12.4*  HCT 39.8 39.1 37.1*  MCV 84.3 84.8 84.5  PLT 166 143* 164   Cardiac Enzymes: No results for input(s): CKTOTAL, CKMB, CKMBINDEX, TROPONINI in the last 168 hours. BNP: BNP (last 3 results)  Recent Labs  02/03/14 0915 02/20/14 1853 03/20/14 0930  PROBNP 3422.0* 3557.0* 8409.0*   CBG: No results for input(s): GLUCAP in the last 168 hours.     SignedChaya Jan:  HERNANDEZ ACOSTA,ESTELA  [facility] Pager: [phone] 03/22/2014, 12:38 PM

## 2014-03-22 NOTE — Care Management Note (Signed)
    Page 1 of 1   03/22/2014     3:04:39 PM CARE MANAGEMENT NOTE 03/22/2014  Patient:  Richard Chavez, Richard Chavez   Account Number:  [id number]  Date Initiated:  03/22/2014  Documentation initiated by:  Lanier Clam  Subjective/Objective Assessment:   80 Y/O M ADMITTED W/PNA.     Action/Plan:   FROM HOME.ACTIVE [facility].   Anticipated DC Date:  03/22/2014   Anticipated DC Plan:  SKILLED NURSING FACILITY      DC Planning Services  CM consult      Choice offered to / List presented to:             Status of service:  Completed, signed off Medicare Important Message given?  YES (If response is "NO", the following Medicare IM given date fields will be blank) Date Medicare IM given:  03/22/2014 Medicare IM given by:  [facility] Date Additional Medicare IM given:   Additional Medicare IM given by:    Discharge Disposition:  SKILLED NURSING FACILITY  Per UR Regulation:  Reviewed for med. necessity/level of care/duration of stay  If discussed at Long Length of Stay Meetings, dates discussed:    Comments:  03/22/14 Lynette Noah RN,BSN NCM [phone] D/C SNF.

## 2014-03-22 NOTE — Consult Note (Addendum)
WOC wound consult note Reason for Consult: Consult requested for left arm skin tears Wound type: 2 areas of partial thickness abrasions Measurement: 1X1X.1cm and .8X.8X.1cm Wound bed: pink and moist Drainage (amount, consistency, odor) small amt yellow drainage, no odor Periwound: intact thin fragile skin surrounding, skin approximated over 90% of each wound. Dressing procedure/placement/frequency: Foam dressing to protect and promote healing. Discussed plan of care with patient and he verbalizes understanding. Please re-consult if further assistance is needed.  Thank-you,  Cammie Mcgee MSN, RN, CWOCN, [place], CNS [phone]

## 2014-03-22 NOTE — Progress Notes (Signed)
Physical Therapy Treatment Patient Details Name: Richard Chavez MRN: [id number] DOB: 1926-12-12 Today's Date: 03/22/2014    History of Present Illness Richard Chavez is a 80 y.o. male with prior h/o  hypertension, Ischemic cardiomyopathy, CRF, COPD, comes in for confusion and generalized weakness for 2 days. Patient is a poor historian and most of the history was obtained from his son at bedside. He had 2 episodes of pneumonia in the last 3 months. On arrival to ED 03/19/14 he was found to be hypoxic, and his CXR revealed chronic lung disease with questionable superimposed pneumonia.     PT Comments    Patient  Stronger today and able to stand and take a few steps with RW, unable to maintain L hand grip on RW. Family not present at the time.  Follow Up Recommendations  SNF (unless family can provide the assistance leve necessary for safety. )     Equipment Recommendations  None recommended by PT    Recommendations for Other Services       Precautions / Restrictions Precautions Precautions: Fall Precaution Comments: monitor sats Restrictions Weight Bearing Restrictions: No    Mobility  Bed Mobility   Bed Mobility: Supine to Sit     Supine to sit: Mod assist;HOB elevated     General bed mobility comments: assist for legs to edge but less assist than previously, support of trunk to upright, unable to push up with L UE  Transfers Overall transfer level: Needs assistance Equipment used: Rolling walker (2 wheeled) Transfers: Sit to/from Stand Sit to Stand: +2 safety/equipment;+2 physical assistance;Mod assist Stand pivot transfers: Mod assist;+2 physical assistance;+2 safety/equipment       General transfer comment: stood x 2 from bed, stood and practiced weight shift, unable to maintain grip with L hand9PT supported on RW), slides of. Pt then able to take small steps to move to recliner,  L knee stable for mobility.  Ambulation/Gait Ambulation/Gait assistance: Mod  assist;+2 safety/equipment;+2 physical assistance Ambulation Distance (Feet): 5 Feet   Gait Pattern/deviations: Step-to pattern     General Gait Details: cues for posture and  stepping    Stairs            Wheelchair Mobility    Modified Rankin (Stroke Patients Only)       Balance                                    Cognition Arousal/Alertness: Awake/alert Behavior During Therapy: WFL for tasks assessed/performed Overall Cognitive Status: Impaired/Different from baseline Area of Impairment: Memory;Safety/judgement;Awareness                    Exercises      General Comments        Pertinent Vitals/Pain Pain Assessment: No/denies pain    Home Living                      Prior Function            PT Goals (current goals can now be found in the care plan section) Progress towards PT goals: Progressing toward goals    Frequency       PT Plan Current plan remains appropriate    Co-evaluation             End of Session Equipment Utilized During Treatment: Gait belt Activity Tolerance: Patient tolerated treatment well Patient left: in chair;with  call bell/phone within reach;with chair alarm set     Time: [id number] PT Time Calculation (min) (ACUTE ONLY): 21 min  Charges:  $Therapeutic Activity: 8-22 mins                    G Codes:      Rada Hay 03/22/2014, 11:37 AM Blanchard Kelch PT [phone]

## 2014-03-22 NOTE — Progress Notes (Signed)
Clinical Social Work Department BRIEF PSYCHOSOCIAL ASSESSMENT 03/22/2014  Patient:  Richard Chavez, Richard Chavez     Account Number:  [id number]     Admit date:  03/19/2014  Clinical Social Worker:  Hampton Abbot, CLINICAL SOCIAL WORKER  Date/Time:  03/22/2014 10:50 AM  Referred by:  Physician  Date Referred:  03/22/2014 Referred for  SNF Placement   Other Referral:   Interview type:  Patient Other interview type:   Son Janeece Riggers) via phone    PSYCHOSOCIAL DATA Living Status:  FAMILY Admitted from facility:   Level of care:   Primary support name:  Janeece Riggers Primary support relationship to patient:  CHILD, ADULT Degree of support available:   Adequate    CURRENT CONCERNS Current Concerns  Post-Acute Placement   Other Concerns:    SOCIAL WORK ASSESSMENT / PLAN CSW received referral from MD and PT who recommends patient goes to a SNF for short term rehab. CS fax out [facility] and other needed clinicals to [facility].   Assessment/plan status:  No Further Intervention Required Other assessment/ plan:   Information/referral to community resources:    PATIENT'S/FAMILY'S RESPONSE TO PLAN OF CARE: CSW spoke with patient at bedside and patient's son, [name] via phone. CSW introduced self and role. CSW explained to patient PT recommendation for SNF. Patient understood the benefits of going to a SNF for short term rehab and is agreeable to go to a SNF. CSW gave patient a list of facilities that were in network with insurance. Patient unaware of SNF's that are in the area that he lives. CSW called patient son who was also agreeable and aware of some facilities. Patient and patient son familiar with [facility] and chooses bed offer from facility. CSW to speak more with [facility] regarding further information about them accepting patient and when patient can arrive. CSW will continue to follow patient.       Hampton Abbot BSW Intern

## 2014-03-22 NOTE — Progress Notes (Signed)
Time@12 :58

## 2014-03-23 ENCOUNTER — Encounter: Payer: Self-pay | Admitting: Registered Nurse

## 2014-03-23 ENCOUNTER — Non-Acute Institutional Stay (SKILLED_NURSING_FACILITY): Payer: Medicare Other | Admitting: Registered Nurse

## 2014-03-23 ENCOUNTER — Encounter: Payer: Medicare Other | Admitting: Cardiovascular Disease

## 2014-03-23 DIAGNOSIS — J189 Pneumonia, unspecified organism: Secondary | ICD-10-CM

## 2014-03-23 DIAGNOSIS — E7800 Pure hypercholesterolemia, unspecified: Secondary | ICD-10-CM

## 2014-03-23 DIAGNOSIS — J449 Chronic obstructive pulmonary disease, unspecified: Secondary | ICD-10-CM

## 2014-03-23 DIAGNOSIS — I10 Essential (primary) hypertension: Secondary | ICD-10-CM

## 2014-03-23 DIAGNOSIS — R339 Retention of urine, unspecified: Secondary | ICD-10-CM

## 2014-03-23 DIAGNOSIS — I5042 Chronic combined systolic (congestive) and diastolic (congestive) heart failure: Secondary | ICD-10-CM

## 2014-03-23 DIAGNOSIS — R531 Weakness: Secondary | ICD-10-CM

## 2014-03-23 DIAGNOSIS — K5901 Slow transit constipation: Secondary | ICD-10-CM

## 2014-03-23 DIAGNOSIS — I480 Paroxysmal atrial fibrillation: Secondary | ICD-10-CM

## 2014-03-23 DIAGNOSIS — K219 Gastro-esophageal reflux disease without esophagitis: Secondary | ICD-10-CM

## 2014-03-23 NOTE — Progress Notes (Signed)
Patient ID: Richard Chavez, male   DOB: 05-Jan-1927, 80 y.o.   MRN: [id number]   Place of Service: [facility]  No Known Allergies  Code Status: Full Code  Goals of Care: Longevity/STR  Chief Complaint  Patient presents with  . Hospitalization Follow-up    HPI 80 y.o. male with PMH of HTN, ischemic cardiomyopathy, COPD among many others is being seen for a post hospital follow up. He is here for STR after hospital admission from 03/19/14 to 03/22/14 for questionable superimposed pneumonia. He had 2 episodes of pneumonia in the last 3 months. He was treated with levaquin 750mg  daily but it was d/c at discharge from the hospital. His son is at bedside and reported that he feels like his father is not getting any better, hemay still has pneumonia.   Review of Systems (limited) Constitutional: Negative for fever and chills HENT:  Negative congestion, and sore throat Cardiovascular: Negative for chest pain. Positive for leg swelling Respiratory: Positive for cough and shortness of breath.   Gastrointestinal: Negative for nausea and vomiting. Positive for constipation Genitourinary: Negative for dysuria. Positive for urinary rentention. Has not void today Musculoskeletal: Negative for back pain, joint pain, and joint swelling  Neurological: Negative for dizziness and headache  Skin: Negative for rash and wound.   Psychiatric: Negative for depression   Past Medical History  Diagnosis Date  . CAD (coronary artery disease), native coronary artery     Status post CABG  . Hypertension   . Hyperlipidemia   . GERD (gastroesophageal reflux disease)   . COPD (chronic obstructive pulmonary disease)   . CRF (chronic renal failure)   . Barrett's esophagus   . OP (osteoporosis)   . CAD (coronary artery disease) of artery bypass graft 07/04/2011    LIMA-LAD; SVG-OM2-OM3, SVG-RCA -- all patent as of 05/27/11  . Syncope 07/04/2011  . Pacemaker 07/04/2011  . Cardiomyopathy, ischemic 07/2011;  09/2012    2D Echo - EF 35-40, mod dilated LA; Relook Echo 09/2012: EF 40-45%  . Myocardial infarction     25 yrs ago  . Peripheral vascular disease   . Arthritis   . BPH (benign prostatic hyperplasia)   . Polyp of colon     removed  . Sleep apnea     STOP BANG SCORE 4  . AV block, 3rd degree     Post St. Jude pacemaker, EF 35-40, does have intermittent atrial tachycardia, either PAt or Afib  . BPH (benign prostatic hypertrophy) 11/15/2011  . Paroxysmal atrial fibrillation - now rate controlled (formally with [facility] Hospital) 10/03/2012  . CVA (cerebral infarction) 10/09/2012    Noted to be in atrial fibrillation with evaluation of pacemaker, prior to CVA. Right-sided MCA stroke with left arm weakness/mild paralysis -- notably improved   . Pneumonia 02/02/2014  . Shortness of breath   . Stroke     left hand weakness    Past Surgical History  Procedure Laterality Date  . Pacemaker placement  2007  . Cholecystectomy  2005  . Coronary artery bypass graft  1978    X3  . Lesion excision      from penis  . Cardiac catheterization  06/06/2011    occluded proximal RCA, ostial LAD and mid Circumflex after OM 1.; [place] LIMA-LAD, patent SVG-OM1-OM 2, patent SVG-RCA. Reduced ejection fraction of 30-35%.  . Cardiac catheterization  06/14/2010    LIMA to LAD, SVG to RCA, SVG to OM1 Circumflex, 100% occluded RCA, 100% occluded circumfkex,  100% occluded LAD, no evidence of graft dysfunction, continue medical therapy  . Cardiac catheterization  04/12/2003    3 vessel coronary artery disease, continue medical treatment  . Cardiac catheterization  10/07/2000    Severe 3 vessel coronary artery disease, continue medical treatment  . Doppler echocardiography  10/02/2012    EF 40-45%; possible grade 1 diastolic dysfunction, but A. fib precludes this. Mild atrial dilatation bilaterally. Moderately elevated pulmonary pressures of 40 mmHg.  . Prostate surgery         Medication List       This list is  accurate as of: 03/23/14 11:48 PM.  Always use your most recent med list.               ADVAIR DISKUS 500-50 MCG/DOSE Aepb  Generic drug:  Fluticasone-Salmeterol  Inhale 1 puff into the lungs every 12 (twelve) hours.     amiodarone 400 MG tablet  Commonly known as:  PACERONE  Take 1 tablet (400 mg total) by mouth daily.     apixaban 5 MG Tabs tablet  Commonly known as:  ELIQUIS  Take 1 tablet (5 mg total) by mouth 2 (two) times daily.     atorvastatin 20 MG tablet  Commonly known as:  LIPITOR  Take 20 mg by mouth daily with breakfast.     COMBIVENT 18-103 MCG/ACT inhaler  Generic drug:  albuterol-ipratropium  Inhale 2 puffs into the lungs 2 (two) times daily.     Fish Oil 1000 MG Caps  Take 1,000 mg by mouth daily.     fluticasone 50 MCG/ACT nasal spray  Commonly known as:  FLONASE  Place 1 spray into both nostrils daily as needed for allergies or rhinitis.     furosemide 40 MG tablet  Commonly known as:  LASIX  Take 1 tablet (40 mg total) by mouth daily.     gabapentin 300 MG capsule  Commonly known as:  NEURONTIN  Take 900 mg by mouth 2 (two) times daily.     guaiFENesin 600 MG 12 hr tablet  Commonly known as:  MUCINEX  Take 1,200 mg by mouth 2 (two) times daily.     lisinopril 2.5 MG tablet  Commonly known as:  PRINIVIL,ZESTRIL  Take 0.5 tablets (1.25 mg total) by mouth daily.     loratadine 10 MG tablet  Commonly known as:  CLARITIN  Take 10 mg by mouth daily with breakfast.     metoprolol 100 MG tablet  Commonly known as:  LOPRESSOR  Take 100 mg by mouth 2 (two) times daily.     potassium chloride 20 MEQ/15ML (10%) solution  Take 15 mLs (20 mEq total) by mouth daily.     predniSONE 5 MG tablet  Commonly known as:  DELTASONE  Take 5 mg by mouth daily.     ranitidine 150 MG capsule  Commonly known as:  ZANTAC  Take 150 mg by mouth 2 (two) times daily.     SLOW-MAG 535 (64 MG) MG Tbcr  Generic drug:  Magnesium Chloride  Take 64 mg by mouth 2  (two) times daily.     tiotropium 18 MCG inhalation capsule  Commonly known as:  SPIRIVA  Place 18 mcg into inhaler and inhale daily with breakfast.     Vitamin D (Ergocalciferol) 50000 UNITS Caps capsule  Commonly known as:  DRISDOL  Take 50,000 Units by mouth every 7 (seven) days. Take on Fridays     vitamin E 1000 UNIT capsule  Take 1,000  Units by mouth daily with breakfast.        Physical Exam Filed Vitals:   03/23/14 2346  BP: 112/67  Pulse: 75  Temp: 98 F (36.7 C)  Resp: 16   Constitutional: WDWN elderly male in no acute distress. Conversant but is not a good historian.  HEENT: Normocephalic and atraumatic. PERRL. EOM intact. No icterus. Oral mucosa moist. Posterior pharynx clear of any exudate or lesions.   Neck: Supple and nontender. No lymphadenopathy, masses, or thyromegaly. No JVD or carotid bruits. Cardiac: Normal S1, S2. RRR without appreciable murmurs, rubs, or gallops. Distal pulses intact. 2+ pitting BLE edema  Lungs: No respiratory distress. Breath sounds diminished with rhonchi bilaterally. On continuous oxygen at 2L via [place] Abdomen: Audible bowel sounds in all quadrants. Soft, nontender, nondistended. Musculoskeletal: Able to move all extremities but strength are diminished throughout. No joint erythema or tenderness. Skin: Warm and dry. No rash noted. No erythema. Scattered bruising noted on BUE Neurological: Alert and oriented to person Psychiatric: Appropriate mood and affect.   Labs Reviewed CBC Latest Ref Rng 03/22/2014 03/21/2014 03/19/2014  WBC 4.0 - 10.5 K/uL 6.5 6.6 6.9  Hemoglobin 13.0 - 17.0 g/dL 12.4(L) 13.0 13.4  Hematocrit 39.0 - 52.0 % 37.1(L) 39.1 39.8  Platelets 150 - 400 K/uL 164 143(L) 166    CMP     Component Value Date/Time   NA 129* 03/22/2014 0424   K 3.3* 03/22/2014 0424   CL 93* 03/22/2014 0424   CO2 23 03/22/2014 0424   GLUCOSE 83 03/22/2014 0424   BUN 14 03/22/2014 0424   CREATININE 1.11 03/22/2014 0424   CREATININE  1.68* 04/23/2013 1221   CALCIUM 7.9* 03/22/2014 0424   PROT 6.4 03/19/2014 1302   ALBUMIN 2.6* 03/19/2014 1302   AST 27 03/19/2014 1302   ALT 28 03/19/2014 1302   ALKPHOS 89 03/19/2014 1302   BILITOT 1.4* 03/19/2014 1302   GFRNONAA 59* 03/22/2014 0424   GFRAA 68* 03/22/2014 0424    BNP    Component Value Date/Time   PROBNP 8409.0* 03/20/2014 0930   Lipid Panel     Component Value Date/Time   CHOL 92 10/02/2012 0820   TRIG 106 10/02/2012 0820   HDL 45 10/02/2012 0820   CHOLHDL 2.0 10/02/2012 0820   VLDL 21 10/02/2012 0820   LDLCALC 26 10/02/2012 0820    Diagnostic Studies Reviewed 03/19/14 PORTABLE CHEST - 1 VIEW  FINDINGS: Chronic interstitial/airspace opacities, suspicious for chronic interstitial lung disease. Superimposed pneumonia is difficult to exclude. No pleural effusion or pneumothorax.  Cardiomegaly. Postsurgical changes related to prior CABG. Right subclavian pacemaker.  IMPRESSION: Suspected chronic interstitial lung disease. Superimposed pneumonia is difficult to exclude.  Assessment & Plan 1. Chronic combined systolic and diastolic congestive heart failure Chronic. Continue lasix 40mg  daily and potassium supplement daily. Continue to monitor for change in status.   2. Paroxysmal atrial fibrillation Stable. Rate-controlled. Continue amiodarone 400mg  daily, lopressor 100mg  bid, and anticoagulant with eliquis 5mg  bid. Continue to monitor.  3. Essential hypertension Stable. Continue lisinopril 2.5mg  daily, metoprolol 100mg  bid, and lasix 40mg  daily.  4. Chronic obstructive pulmonary disease, unspecified COPD, unspecified chronic bronchitis type Stable. Continue prednisone 5mg  daily, spiriva daily, advair 500/34mcg 1 puff BID, and combivent 18/115mcg 2 puffs bid. Continue O2 at 2L via [place] to maintain sat >88%. Continue to monitor   5. HCAP (healthcare-associated pneumonia) Will start levaquin 750mg  daily x 7 days and florastor 250mg  BID  x 2 weeks.  Will obtain CXR to  rule out pulmonary edema and recurrent/unresolved pna. Continue mucinex 1200mg  BID for congestion. Continue to monitor.  6. Gastroesophageal reflux disease, esophagitis presence not specified Stable. Continue zantac 150mg  bid and monitor.   7. Hypercholesteremia Stable. Continue lipitor 20mg  daily and fish oil 1g daily. Continue to monitor  8. Weakness Persists. Continue to work with PT/OT as tolerated for strength/gait/balance training and ADLs care. Continue to monitor.   9. Urinary retention I&O cath once and Q6H PRN if no voiding in more than 6 hrs. He is a patient of Dr. Delanna AhmadiBaum with [facility]. Will arrange for f/u with urology if urinary retention persists.   10. Constipation Has not had BM in 3 days. Will start miralax 17g daily and montior  Labs/Diagnostic Studides Ordered: CXR  Time spent with patient: more than 50 minutes  Family/Staff Communication Plan of care discuss with family and nursing staff. Son and nursing staff verbalize understanding and agree with plan of care. No additional questions or concerns reported.    Loura BackKim Jaydon Avina, MSN, AGNP-C [facility] [address] [place], [place] [address] [phone] [8am-5pm] After hours: [phone]

## 2014-03-25 LAB — CULTURE, BLOOD (ROUTINE X 2)
CULTURE: NO GROWTH
Culture: NO GROWTH

## 2014-03-26 ENCOUNTER — Non-Acute Institutional Stay (SKILLED_NURSING_FACILITY): Payer: Medicare Other | Admitting: Internal Medicine

## 2014-03-26 DIAGNOSIS — I482 Chronic atrial fibrillation, unspecified: Secondary | ICD-10-CM

## 2014-03-26 DIAGNOSIS — I509 Heart failure, unspecified: Secondary | ICD-10-CM

## 2014-03-26 DIAGNOSIS — J189 Pneumonia, unspecified organism: Secondary | ICD-10-CM

## 2014-03-26 DIAGNOSIS — R5381 Other malaise: Secondary | ICD-10-CM

## 2014-03-26 DIAGNOSIS — G9340 Encephalopathy, unspecified: Secondary | ICD-10-CM

## 2014-03-26 NOTE — Progress Notes (Signed)
Patient ID: Richard Chavez, male   DOB: October 05, 1926, 80 y.o.   MRN: [id number]    Facility: [facility]   Chief Complaint  Patient presents with  . New Admit To SNF   No Known Allergies  Code status: full code  HPI 80 y/o male pt is here for STR post hospital admission from 03/19/14-03/22/14 with confusion and generalized weakness. He was treated for acute respiratory failure in setting of pneumonia and chf and sent to SNF with deconditioning. He has PMH of hypertension, Ischemic cardiomyopathy, COPD, CAD among others He is seen in his room today. As per nursing and therapy team he has been more lethargic and weak. He has minimal participation in therapy. He desaturated to 84% on 2 l oxygen on exertion today. He complaints of dyspnea on exertion and easy fatigue to me.    ROS Constitutional: Negative for fever and chills HENT:  Negative for congestion Cardiovascular: Negative for chest pain. Positive for leg swelling Respiratory: Positive for cough and shortness of breath.   Gastrointestinal: Negative for nausea and vomiting.  Genitourinary: Negative for dysuria.  Musculoskeletal: Negative for back pain Neurological: Negative for dizziness and headache  Skin: Negative for rash   Psychiatric: Negative for depression   Past Medical History  Diagnosis Date  . CAD (coronary artery disease), native coronary artery     Status post CABG  . Hypertension   . Hyperlipidemia   . GERD (gastroesophageal reflux disease)   . COPD (chronic obstructive pulmonary disease)   . CRF (chronic renal failure)   . Barrett's esophagus   . OP (osteoporosis)   . CAD (coronary artery disease) of artery bypass graft 07/04/2011    LIMA-LAD; SVG-OM2-OM3, SVG-RCA -- all patent as of 05/27/11  . Syncope 07/04/2011  . Pacemaker 07/04/2011  . Cardiomyopathy, ischemic 07/2011; 09/2012    2D Echo - EF 35-40, mod dilated LA; Relook Echo 09/2012: EF 40-45%  . Myocardial infarction     25 yrs  ago  . Peripheral vascular disease   . Arthritis   . BPH (benign prostatic hyperplasia)   . Polyp of colon     removed  . Sleep apnea     STOP BANG SCORE 4  . AV block, 3rd degree     Post St. Jude pacemaker, EF 35-40, does have intermittent atrial tachycardia, either PAt or Afib  . BPH (benign prostatic hypertrophy) 11/15/2011  . Paroxysmal atrial fibrillation - now rate controlled (formally with [facility] Hospital) 10/03/2012  . CVA (cerebral infarction) 10/09/2012    Noted to be in atrial fibrillation with evaluation of pacemaker, prior to CVA. Right-sided MCA stroke with left arm weakness/mild paralysis -- notably improved   . Pneumonia 02/02/2014  . Shortness of breath   . Stroke     left hand weakness   Past Surgical History  Procedure Laterality Date  . Pacemaker placement  2007  . Cholecystectomy  2005  . Coronary artery bypass graft  1978    X3  . Lesion excision      from penis  . Cardiac catheterization  06/06/2011    occluded proximal RCA, ostial LAD and mid Circumflex after OM 1.; [place] LIMA-LAD, patent SVG-OM1-OM 2, patent SVG-RCA. Reduced ejection fraction of 30-35%.  . Cardiac catheterization  06/14/2010    LIMA to LAD, SVG to RCA, SVG to OM1 Circumflex, 100% occluded RCA, 100% occluded circumfkex, 100% occluded LAD, no evidence of graft dysfunction, continue medical therapy  . Cardiac catheterization  04/12/2003    3 vessel coronary artery disease, continue medical treatment  . Cardiac catheterization  10/07/2000    Severe 3 vessel coronary artery disease, continue medical treatment  . Doppler echocardiography  10/02/2012    EF 40-45%; possible grade 1 diastolic dysfunction, but A. fib precludes this. Mild atrial dilatation bilaterally. Moderately elevated pulmonary pressures of 40 mmHg.  . Prostate surgery     Medication reviewed. See MAR  Family History  Problem Relation Age of Onset  . Leukemia Father   . Heart disease Father   . Alzheimer's disease Mother   .  Arthritis Mother   . Lung cancer Son [id number]  . Alzheimer's disease Maternal Grandmother    History   Social History  . Marital Status: Widowed    Spouse Name: N/A    Number of Children: 1  . Years of Education: 8   Occupational History  . RETIRED [employer]   Social History Main Topics  . Smoking status: Former Smoker -- 0.30 packs/day for 15 years    Types: Cigarettes    Quit date: 05/07/1985  . Smokeless tobacco: Never Used  . Alcohol Use: No  . Drug Use: No  . Sexual Activity: No   Other Topics Concern  . Not on file   Social History Narrative   Patient's son Janeece RiggersDavid Chaffin lives with him at home.   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   Physical exam 110/62, 84/min, 20, 94% on o2, 97.6  General- elderly male in no acute distress, lethargic Head- atraumatic, normocephalic Eyes- no pallor, no icterus, no discharge Neck- no lymphadenopathy Mouth- normal mucus membrane Cardiovascular- normal s1,s2, no murmurs Respiratory- bilateral poor air entry with coarse rhonchi, crackles present, no use of accessory muscles  Abdomen- bowel sounds present, soft, non tender Musculoskeletal- able to move all 4 extremities, generalized weakness, 2+ pitting lower extremity edema Neurological- no focal deficit Skin- warm and dry, easy bruising Psychiatry- normal mood and affect  Labs reviewed  Assessment/plan  Physical deconditioning Will have him work with physical therapy and occupational therapy team to help with gait training and muscle strengthening exercises.fall precautions. Skin care. Encourage to be out of bed. If no improvement noted with therapy, will have goals of care discussion with family  chf exacerbation With crackles and increased on exam, drop  in o2 sat with exertion, will increase lasix to 60 mg daily for 3 days, then 40 mg daily, monitor weight and breathing status. Check bmp. Continue o2  Pneumonia Will have him on levaquin x 1week, aspiration precautions, will get SLP consult. Will need assistance with feeding for now. Continue o2  atrial fibrillation Rate controlled. Continue amiodarone and eliquis  Acute encephalopathy Persists, his malnutrition, chronic medical illnesses with recent infection, deconditioning could all be contributing to this. Check cbc with diff and cmp. Start diuresis. Continue antibiotics. Monitor clinically for now

## 2014-03-28 ENCOUNTER — Inpatient Hospital Stay (HOSPITAL_COMMUNITY)
Admission: EM | Admit: 2014-03-28 | Discharge: 2014-04-06 | DRG: 871 | Disposition: E | Payer: Medicare Other | Attending: Internal Medicine | Admitting: Internal Medicine

## 2014-03-28 ENCOUNTER — Encounter (HOSPITAL_COMMUNITY): Payer: Self-pay | Admitting: *Deleted

## 2014-03-28 ENCOUNTER — Emergency Department (HOSPITAL_COMMUNITY): Payer: Medicare Other

## 2014-03-28 DIAGNOSIS — J9600 Acute respiratory failure, unspecified whether with hypoxia or hypercapnia: Secondary | ICD-10-CM | POA: Diagnosis present

## 2014-03-28 DIAGNOSIS — J189 Pneumonia, unspecified organism: Secondary | ICD-10-CM | POA: Diagnosis present

## 2014-03-28 DIAGNOSIS — I130 Hypertensive heart and chronic kidney disease with heart failure and stage 1 through stage 4 chronic kidney disease, or unspecified chronic kidney disease: Secondary | ICD-10-CM | POA: Diagnosis present

## 2014-03-28 DIAGNOSIS — Z8673 Personal history of transient ischemic attack (TIA), and cerebral infarction without residual deficits: Secondary | ICD-10-CM | POA: Diagnosis not present

## 2014-03-28 DIAGNOSIS — R0600 Dyspnea, unspecified: Secondary | ICD-10-CM | POA: Diagnosis not present

## 2014-03-28 DIAGNOSIS — J9690 Respiratory failure, unspecified, unspecified whether with hypoxia or hypercapnia: Secondary | ICD-10-CM

## 2014-03-28 DIAGNOSIS — I2510 Atherosclerotic heart disease of native coronary artery without angina pectoris: Secondary | ICD-10-CM | POA: Diagnosis present

## 2014-03-28 DIAGNOSIS — K219 Gastro-esophageal reflux disease without esophagitis: Secondary | ICD-10-CM | POA: Diagnosis present

## 2014-03-28 DIAGNOSIS — K2270 Barrett's esophagus without dysplasia: Secondary | ICD-10-CM | POA: Diagnosis present

## 2014-03-28 DIAGNOSIS — I5021 Acute systolic (congestive) heart failure: Secondary | ICD-10-CM | POA: Insufficient documentation

## 2014-03-28 DIAGNOSIS — R0602 Shortness of breath: Secondary | ICD-10-CM

## 2014-03-28 DIAGNOSIS — G9340 Encephalopathy, unspecified: Secondary | ICD-10-CM | POA: Diagnosis present

## 2014-03-28 DIAGNOSIS — J690 Pneumonitis due to inhalation of food and vomit: Secondary | ICD-10-CM | POA: Diagnosis present

## 2014-03-28 DIAGNOSIS — R627 Adult failure to thrive: Secondary | ICD-10-CM | POA: Diagnosis present

## 2014-03-28 DIAGNOSIS — Z7901 Long term (current) use of anticoagulants: Secondary | ICD-10-CM

## 2014-03-28 DIAGNOSIS — I25810 Atherosclerosis of coronary artery bypass graft(s) without angina pectoris: Secondary | ICD-10-CM | POA: Diagnosis present

## 2014-03-28 DIAGNOSIS — A419 Sepsis, unspecified organism: Secondary | ICD-10-CM | POA: Diagnosis not present

## 2014-03-28 DIAGNOSIS — E872 Acidosis: Secondary | ICD-10-CM | POA: Diagnosis present

## 2014-03-28 DIAGNOSIS — N179 Acute kidney failure, unspecified: Secondary | ICD-10-CM | POA: Diagnosis present

## 2014-03-28 DIAGNOSIS — Z7952 Long term (current) use of systemic steroids: Secondary | ICD-10-CM | POA: Diagnosis not present

## 2014-03-28 DIAGNOSIS — J9601 Acute respiratory failure with hypoxia: Secondary | ICD-10-CM | POA: Diagnosis present

## 2014-03-28 DIAGNOSIS — Z66 Do not resuscitate: Secondary | ICD-10-CM | POA: Diagnosis present

## 2014-03-28 DIAGNOSIS — K5900 Constipation, unspecified: Secondary | ICD-10-CM | POA: Diagnosis present

## 2014-03-28 DIAGNOSIS — I480 Paroxysmal atrial fibrillation: Secondary | ICD-10-CM | POA: Diagnosis present

## 2014-03-28 DIAGNOSIS — Y95 Nosocomial condition: Secondary | ICD-10-CM | POA: Diagnosis present

## 2014-03-28 DIAGNOSIS — I5022 Chronic systolic (congestive) heart failure: Secondary | ICD-10-CM

## 2014-03-28 DIAGNOSIS — Z950 Presence of cardiac pacemaker: Secondary | ICD-10-CM | POA: Diagnosis not present

## 2014-03-28 DIAGNOSIS — R6521 Severe sepsis with septic shock: Secondary | ICD-10-CM

## 2014-03-28 DIAGNOSIS — F419 Anxiety disorder, unspecified: Secondary | ICD-10-CM | POA: Diagnosis present

## 2014-03-28 DIAGNOSIS — Z87891 Personal history of nicotine dependence: Secondary | ICD-10-CM | POA: Diagnosis not present

## 2014-03-28 DIAGNOSIS — R451 Restlessness and agitation: Secondary | ICD-10-CM | POA: Diagnosis present

## 2014-03-28 DIAGNOSIS — R0689 Other abnormalities of breathing: Secondary | ICD-10-CM

## 2014-03-28 DIAGNOSIS — J449 Chronic obstructive pulmonary disease, unspecified: Secondary | ICD-10-CM | POA: Diagnosis present

## 2014-03-28 DIAGNOSIS — E785 Hyperlipidemia, unspecified: Secondary | ICD-10-CM | POA: Diagnosis present

## 2014-03-28 DIAGNOSIS — N189 Chronic kidney disease, unspecified: Secondary | ICD-10-CM | POA: Diagnosis present

## 2014-03-28 DIAGNOSIS — Z515 Encounter for palliative care: Secondary | ICD-10-CM | POA: Diagnosis not present

## 2014-03-28 DIAGNOSIS — R0603 Acute respiratory distress: Secondary | ICD-10-CM

## 2014-03-28 DIAGNOSIS — I5023 Acute on chronic systolic (congestive) heart failure: Secondary | ICD-10-CM | POA: Diagnosis present

## 2014-03-28 DIAGNOSIS — I252 Old myocardial infarction: Secondary | ICD-10-CM | POA: Diagnosis not present

## 2014-03-28 LAB — CBC WITH DIFFERENTIAL/PLATELET
BASOS ABS: 0 10*3/uL (ref 0.0–0.1)
BASOS PCT: 0 % (ref 0–1)
EOS ABS: 0.4 10*3/uL (ref 0.0–0.7)
Eosinophils Relative: 4 % (ref 0–5)
HCT: 39.1 % (ref 39.0–52.0)
Hemoglobin: 13.4 g/dL (ref 13.0–17.0)
Lymphocytes Relative: 14 % (ref 12–46)
Lymphs Abs: 1.4 10*3/uL (ref 0.7–4.0)
MCH: 28.2 pg (ref 26.0–34.0)
MCHC: 34.3 g/dL (ref 30.0–36.0)
MCV: 82.3 fL (ref 78.0–100.0)
MONO ABS: 0.6 10*3/uL (ref 0.1–1.0)
Monocytes Relative: 7 % (ref 3–12)
NEUTROS ABS: 7.2 10*3/uL (ref 1.7–7.7)
Neutrophils Relative %: 75 % (ref 43–77)
Platelets: 214 10*3/uL (ref 150–400)
RBC: 4.75 MIL/uL (ref 4.22–5.81)
RDW: 19.8 % — AB (ref 11.5–15.5)
WBC: 9.5 10*3/uL (ref 4.0–10.5)

## 2014-03-28 LAB — URINE MICROSCOPIC-ADD ON

## 2014-03-28 LAB — BASIC METABOLIC PANEL
Anion gap: 20 — ABNORMAL HIGH (ref 5–15)
BUN: 77 mg/dL — ABNORMAL HIGH (ref 6–23)
CALCIUM: 9 mg/dL (ref 8.4–10.5)
CO2: 19 meq/L (ref 19–32)
CREATININE: 3.09 mg/dL — AB (ref 0.50–1.35)
Chloride: 93 mEq/L — ABNORMAL LOW (ref 96–112)
GFR calc Af Amer: 20 mL/min — ABNORMAL LOW (ref >90)
GFR calc non Af Amer: 17 mL/min — ABNORMAL LOW (ref >90)
Glucose, Bld: 89 mg/dL (ref 70–99)
Potassium: 4.7 mEq/L (ref 3.7–5.3)
Sodium: 132 mEq/L — ABNORMAL LOW (ref 137–147)

## 2014-03-28 LAB — I-STAT TROPONIN, ED: Troponin i, poc: 0.05 ng/mL (ref 0.00–0.08)

## 2014-03-28 LAB — MRSA PCR SCREENING: MRSA BY PCR: NEGATIVE

## 2014-03-28 LAB — I-STAT ARTERIAL BLOOD GAS, ED
ACID-BASE DEFICIT: 4 mmol/L — AB (ref 0.0–2.0)
BICARBONATE: 20.8 meq/L (ref 20.0–24.0)
O2 Saturation: 98 %
PCO2 ART: 34.8 mmHg — AB (ref 35.0–45.0)
PH ART: 7.385 (ref 7.350–7.450)
PO2 ART: 98 mmHg (ref 80.0–100.0)
TCO2: 22 mmol/L (ref 0–100)

## 2014-03-28 LAB — URINALYSIS, ROUTINE W REFLEX MICROSCOPIC
GLUCOSE, UA: NEGATIVE mg/dL
Hgb urine dipstick: NEGATIVE
KETONES UR: NEGATIVE mg/dL
Nitrite: NEGATIVE
PH: 5 (ref 5.0–8.0)
Protein, ur: NEGATIVE mg/dL
SPECIFIC GRAVITY, URINE: 1.02 (ref 1.005–1.030)
Urobilinogen, UA: 1 mg/dL (ref 0.0–1.0)

## 2014-03-28 LAB — PRO B NATRIURETIC PEPTIDE: PRO B NATRI PEPTIDE: 16862 pg/mL — AB (ref 0–450)

## 2014-03-28 LAB — CBG MONITORING, ED: Glucose-Capillary: 96 mg/dL (ref 70–99)

## 2014-03-28 LAB — I-STAT CG4 LACTIC ACID, ED: Lactic Acid, Venous: 2.82 mmol/L — ABNORMAL HIGH (ref 0.5–2.2)

## 2014-03-28 LAB — STREP PNEUMONIAE URINARY ANTIGEN: Strep Pneumo Urinary Antigen: NEGATIVE

## 2014-03-28 LAB — PROCALCITONIN: PROCALCITONIN: 1.06 ng/mL

## 2014-03-28 MED ORDER — SODIUM CHLORIDE 0.9 % IV SOLN
INTRAVENOUS | Status: DC
  Administered 2014-03-28: 18:00:00 via INTRAVENOUS

## 2014-03-28 MED ORDER — IPRATROPIUM BROMIDE 0.02 % IN SOLN
0.5000 mg | Freq: Three times a day (TID) | RESPIRATORY_TRACT | Status: DC
Start: 2014-03-28 — End: 2014-03-29
  Administered 2014-03-28: 0.5 mg via RESPIRATORY_TRACT
  Filled 2014-03-28: qty 2.5

## 2014-03-28 MED ORDER — CHLORHEXIDINE GLUCONATE 0.12 % MT SOLN
15.0000 mL | Freq: Two times a day (BID) | OROMUCOSAL | Status: DC
  Administered 2014-03-28 – 2014-03-31 (×5): 15 mL via OROMUCOSAL
  Filled 2014-03-28 (×8): qty 15

## 2014-03-28 MED ORDER — ALBUTEROL SULFATE (2.5 MG/3ML) 0.083% IN NEBU: 2.5000 mg | INHALATION_SOLUTION | RESPIRATORY_TRACT | Status: DC | PRN

## 2014-03-28 MED ORDER — VANCOMYCIN HCL 10 G IV SOLR
1500.0000 mg | INTRAVENOUS | Status: DC
  Administered 2014-03-28 – 2014-03-30 (×2): 1500 mg via INTRAVENOUS
  Filled 2014-03-28 (×2): qty 1500

## 2014-03-28 MED ORDER — SODIUM CHLORIDE 0.9 % IV SOLN
INTRAVENOUS | Status: DC
  Administered 2014-03-28: 21:00:00 via INTRAVENOUS

## 2014-03-28 MED ORDER — AMIODARONE HCL 200 MG PO TABS
400.0000 mg | ORAL_TABLET | Freq: Every day | ORAL | Status: DC
  Administered 2014-03-30: 400 mg via ORAL
  Filled 2014-03-28 (×3): qty 2

## 2014-03-28 MED ORDER — DEXTROSE 5 % IV SOLN
1.0000 g | Freq: Once | INTRAVENOUS | Status: AC
  Administered 2014-03-28: 1 g via INTRAVENOUS
  Filled 2014-03-28: qty 1

## 2014-03-28 MED ORDER — CETYLPYRIDINIUM CHLORIDE 0.05 % MT LIQD
7.0000 mL | Freq: Two times a day (BID) | OROMUCOSAL | Status: DC
  Administered 2014-03-30 (×2): 7 mL via OROMUCOSAL

## 2014-03-28 MED ORDER — ONDANSETRON HCL 4 MG/2ML IJ SOLN: 4.0000 mg | Freq: Four times a day (QID) | INTRAMUSCULAR | Status: DC | PRN

## 2014-03-28 MED ORDER — HEPARIN (PORCINE) IN NACL 100-0.45 UNIT/ML-% IJ SOLN
1100.0000 [IU]/h | INTRAMUSCULAR | Status: DC
  Administered 2014-03-28: 1400 [IU]/h via INTRAVENOUS
  Administered 2014-03-29: 1100 [IU]/h via INTRAVENOUS
  Filled 2014-03-28 (×3): qty 250

## 2014-03-28 MED ORDER — LEVALBUTEROL HCL 0.63 MG/3ML IN NEBU
0.6300 mg | INHALATION_SOLUTION | Freq: Three times a day (TID) | RESPIRATORY_TRACT | Status: DC
  Administered 2014-03-28: 0.63 mg via RESPIRATORY_TRACT
  Filled 2014-03-28: qty 3

## 2014-03-28 MED ORDER — ACETAMINOPHEN 325 MG PO TABS: 650.0000 mg | ORAL_TABLET | ORAL | Status: DC | PRN

## 2014-03-28 MED ORDER — DEXTROSE 5 % IV SOLN
1.0000 g | INTRAVENOUS | Status: DC
  Filled 2014-03-28: qty 1

## 2014-03-28 MED ORDER — METHYLPREDNISOLONE SODIUM SUCC 40 MG IJ SOLR
40.0000 mg | Freq: Two times a day (BID) | INTRAMUSCULAR | Status: DC
  Administered 2014-03-28 – 2014-03-31 (×6): 40 mg via INTRAVENOUS
  Filled 2014-03-28 (×8): qty 1

## 2014-03-28 NOTE — ED Provider Notes (Signed)
CSN: [id number]     Arrival date & time 03/17/2014  1417 History   First MD Initiated Contact with Patient 03/17/2014 1425     Chief Complaint  Patient presents with  . Respiratory Distress     (Consider location/radiation/quality/duration/timing/severity/associated sxs/prior Treatment) HPI  This is an 80 year old man with multiple medical problems including coronary artery disease, ischemic cardiomyopathy, COPD, hypertension, obesity, previous MI, who presents from the skilled nursing facility with respiratory distress. The patient was discharged 6 days ago after being admitted for septic pneumonia. Th. The history is gathered from EMS as the patient is unable to speak. And has altered mental status. EMS was initially called out for hypertension.  He was found to have a systolic pressure of 110, however. Respiratory rate was 36-40/m. The patient appeared to be in acute respiratory distress. EMS tried to apply a CPAP mask. However, due to lack of dentition and long facial structure. This could not be achieved. The patient's initial O2 saturations on 10 L via an IV are 98%. No reported fevers per  staff at the skilled nursing facility  Past Medical History  Diagnosis Date  . CAD (coronary artery disease), native coronary artery     Status post CABG  . Hypertension   . Hyperlipidemia   . GERD (gastroesophageal reflux disease)   . COPD (chronic obstructive pulmonary disease)   . CRF (chronic renal failure)   . Barrett's esophagus   . OP (osteoporosis)   . CAD (coronary artery disease) of artery bypass graft 07/04/2011    LIMA-LAD; SVG-OM2-OM3, SVG-RCA -- all patent as of 05/27/11  . Syncope 07/04/2011  . Pacemaker 07/04/2011  . Cardiomyopathy, ischemic 07/2011; 09/2012    2D Echo - EF 35-40, mod dilated LA; Relook Echo 09/2012: EF 40-45%  . Myocardial infarction     25 yrs ago  . Peripheral vascular disease   . Arthritis   . BPH (benign prostatic hyperplasia)   . Polyp of colon     removed   . Sleep apnea     STOP BANG SCORE 4  . AV block, 3rd degree     Post St. Jude pacemaker, EF 35-40, does have intermittent atrial tachycardia, either PAt or Afib  . BPH (benign prostatic hypertrophy) 11/15/2011  . Paroxysmal atrial fibrillation - now rate controlled (formally with [facility] and Hospital) 10/03/2012  . CVA (cerebral infarction) 10/09/2012    Noted to be in atrial fibrillation with evaluation of pacemaker, prior to CVA. Right-sided MCA stroke with left arm weakness/mild paralysis -- notably improved   . Pneumonia 02/02/2014  . Shortness of breath   . Stroke     left hand weakness   Past Surgical History  Procedure Laterality Date  . Pacemaker placement  2007  . Cholecystectomy  2005  . Coronary artery bypass graft  1978    X3  . Lesion excision      from penis  . Cardiac catheterization  06/06/2011    occluded proximal RCA, ostial LAD and mid Circumflex after OM 1.; [place] LIMA-LAD, patent SVG-OM1-OM 2, patent SVG-RCA. Reduced ejection fraction of 30-35%.  . Cardiac catheterization  06/14/2010    LIMA to LAD, SVG to RCA, SVG to OM1 Circumflex, 100% occluded RCA, 100% occluded circumfkex, 100% occluded LAD, no evidence of graft dysfunction, continue medical therapy  . Cardiac catheterization  04/12/2003    3 vessel coronary artery disease, continue medical treatment  . Cardiac catheterization  10/07/2000    Severe 3 vessel coronary artery disease, continue  medical treatment  . Doppler echocardiography  10/02/2012    EF 40-45%; possible grade 1 diastolic dysfunction, but A. fib precludes this. Mild atrial dilatation bilaterally. Moderately elevated pulmonary pressures of 40 mmHg.  . Prostate surgery     Family History  Problem Relation Age of Onset  . Leukemia Father   . Heart disease Father   . Alzheimer's disease Mother   . Arthritis Mother   . Lung cancer Son 77  . Alzheimer's disease Maternal Grandmother    History  Substance Use Topics  . Smoking status: Former Smoker --  0.30 packs/day for 15 years    Types: Cigarettes    Quit date: 05/07/1985  . Smokeless tobacco: Never Used  . Alcohol Use: No    Review of Systems  Unable to perform ROS   .  Allergies  Review of patient's allergies indicates no known allergies.  Home Medications   Prior to Admission medications   Medication Sig Start Date End Date Taking? Authorizing Provider  acetaminophen (TYLENOL) 325 MG tablet Take 650 mg by mouth every 6 (six) hours as needed for fever (pain).   Yes Historical Provider, MD  albuterol-ipratropium (COMBIVENT) 18-103 MCG/ACT inhaler Inhale 2 puffs into the lungs 2 (two) times daily.    Yes Historical Provider, MD  amiodarone (PACERONE) 400 MG tablet Take 1 tablet (400 mg total) by mouth daily. 02/06/14  Yes Marinda Elk, MD  apixaban (ELIQUIS) 5 MG TABS tablet Take 1 tablet (5 mg total) by mouth 2 (two) times daily. 11/26/13  Yes Marykay Lex, MD  atorvastatin (LIPITOR) 20 MG tablet Take 20 mg by mouth every evening.    Yes Historical Provider, MD  budesonide-formoterol (SYMBICORT) 160-4.5 MCG/ACT inhaler Inhale 2 puffs into the lungs 2 (two) times daily.   Yes Historical Provider, MD  fluticasone (FLONASE) 50 MCG/ACT nasal spray Place 1 spray into both nostrils daily as needed for allergies or rhinitis.    Yes Historical Provider, MD  furosemide (LASIX) 40 MG tablet Take 40 mg by mouth daily.   Yes Historical Provider, MD  gabapentin (NEURONTIN) 300 MG capsule Take 900 mg by mouth 2 (two) times daily.    Yes Historical Provider, MD  guaiFENesin (MUCINEX) 600 MG 12 hr tablet Take 1,200 mg by mouth 2 (two) times daily.   Yes Historical Provider, MD  lisinopril (PRINIVIL,ZESTRIL) 2.5 MG tablet Take 0.5 tablets (1.25 mg total) by mouth daily. 02/06/14  Yes Marinda Elk, MD  loratadine (CLARITIN) 10 MG tablet Take 10 mg by mouth daily with breakfast.    Yes Historical Provider, MD  Magnesium Chloride (SLOW-MAG) 535 (64 MG) MG TBCR Take 64 mg by mouth 2  (two) times daily.    Yes Historical Provider, MD  metoprolol (LOPRESSOR) 100 MG tablet Take 100 mg by mouth 2 (two) times daily.   Yes Historical Provider, MD  Omega-3 Fatty Acids (FISH OIL) 1000 MG CAPS Take 1,000 mg by mouth every evening.    Yes Historical Provider, MD  polyethylene glycol (MIRALAX / GLYCOLAX) packet Take 17 g by mouth daily.   Yes Historical Provider, MD  potassium chloride 20 MEQ/15ML (10%) solution Take 15 mLs (20 mEq total) by mouth daily. Patient taking differently: Take 20 mEq by mouth every evening.  02/06/14  Yes Marinda Elk, MD  predniSONE (DELTASONE) 5 MG tablet Take 5 mg by mouth daily.   Yes Historical Provider, MD  ranitidine (ZANTAC) 150 MG capsule Take 150 mg by mouth 2 (two) times daily.  Yes Historical Provider, MD  saccharomyces boulardii (FLORASTOR) 250 MG capsule Take 250 mg by mouth 2 (two) times daily. For 2 weeks   Yes Historical Provider, MD  tiotropium (SPIRIVA) 18 MCG inhalation capsule Place 18 mcg into inhaler and inhale daily with breakfast.    Yes Historical Provider, MD  Vitamin D, Ergocalciferol, (DRISDOL) 50000 UNITS CAPS Take 50,000 Units by mouth every 7 (seven) days. Take on Fridays   Yes Historical Provider, MD  vitamin E 1000 UNIT capsule Take 1,000 Units by mouth daily with breakfast.    Yes Historical Provider, MD  furosemide (LASIX) 40 MG tablet Take 1 tablet (40 mg total) by mouth daily. Patient not taking: Reported on 04-07-14 02/06/14   Marinda Elk, MD   BP 130/111 mmHg  Pulse 76  Resp 23  SpO2 98% Physical Exam  Constitutional: He appears lethargic. He appears distressed.  HENT:  Head: Normocephalic and atraumatic.  Eyes: Conjunctivae are normal.  Neck: Neck supple. No JVD present.  Cardiovascular: Normal rate and regular rhythm.   Pulmonary/Chest: Breath sounds normal. He is in respiratory distress.  Diffuse wheezes and crackles throughout lungs. tachypnea  Abdominal: Soft. Bowel sounds are normal. He  exhibits no distension.  Musculoskeletal: Normal range of motion.  Neurological: He has normal strength. He appears lethargic. No cranial nerve deficit or sensory deficit. GCS eye subscore is 4.  Skin: He is not diaphoretic.  Vitals reviewed.   ED Course  Procedures (including critical care time) Labs Review Labs Reviewed  CBC WITH DIFFERENTIAL - Abnormal; Notable for the following:    RDW 19.8 (*)    All other components within normal limits  I-STAT ARTERIAL BLOOD GAS, ED - Abnormal; Notable for the following:    pCO2 arterial 34.8 (*)    Acid-base deficit 4.0 (*)    All other components within normal limits  I-STAT CG4 LACTIC ACID, ED - Abnormal; Notable for the following:    Lactic Acid, Venous 2.82 (*)    All other components within normal limits  CULTURE, BLOOD (ROUTINE X 2)  CULTURE, BLOOD (ROUTINE X 2)  BASIC METABOLIC PANEL  PRO B NATRIURETIC PEPTIDE  URINALYSIS, ROUTINE W REFLEX MICROSCOPIC  I-STAT TROPOININ, ED  CBG MONITORING, ED    Imaging Review Dg Chest Portable 1 View  2014/04/07   CLINICAL DATA:  Per ED note: Pt arrived by [facility] from [facility]. Pt was just dc from [facility] on Monday for pneumonia. Called ems for hypotension and resp distress. Wheezing/crackles noted by ems, given neb tx pta, diminished lower lung sounds, altered mental status, did not tolerate cpap pta.  EXAM: PORTABLE CHEST - 1 VIEW  COMPARISON:  03/19/2014  FINDINGS: Right-sided pacemaker and sternotomy wires are unchanged. Lungs are adequately inflated with diffuse bilateral airspace opacification which is worse and likely represents infection although cannot exclude a component of interstitial edema. No definite effusion. Moderate stable cardiomegaly. Remainder of the exam is unchanged.  IMPRESSION: Worsening diffuse bilateral airspace process likely infection, although cannot exclude a component interstitial edema.  Stable cardiomegaly.   Electronically Signed   By: Elberta Fortis M.D.   On:  04-07-14 15:01     EKG Interpretation   Date/Time:  Sunday 07-Apr-2014 14:23:07 EST Ventricular Rate:  80 PR Interval:  212 QRS Duration: 126 QT Interval:  467 QTC Calculation: 539 R Axis:   -4 Text Interpretation:  Sinus rhythm Borderline prolonged PR interval  Nonspecific intraventricular conduction delay Probable anterior infarct,  age indeterminate Minimal  ST elevation, inferior leads Since previous  tracing pacer spikes no longer elevated Confirmed by [facility] Hospital[facility]  MD, MARTHA  [phone]([id number]) on Sep 25, 2013 4:25:00 PM      MDM   Final diagnoses:  SOB (shortness of breath)    BP 130/111 mmHg  Pulse 76  Temp(Src) 99.9 F (37.7 C) (Rectal)  Resp 23  Wt 226 lb (102.513 kg)  SpO2 98% Patient with hypertension lactate 2.82 Cbc without leukocytosis Blood gas shows elevated O2,, compensated CXR shows worsening pneumonia, perhaps some pulmonary edema.  patiet is stable on NRB and more arousable.  Patient with anion gap of 20 Pro bnp is pending. Xray concerning for failed HCAP treatment. IV abx started, Patient may have a component of pulmonary edema. Patient will be admitted. Pt stable in ED with no significant deterioration in condition.    Arthor Captainbigail Kambry Takacs, PA-C 04/02/14 1044  Ethelda ChickMartha K Linker, MD 04/05/14 [phone]

## 2014-03-28 NOTE — Progress Notes (Signed)
ANTICOAGULATION CONSULT NOTE - Initial Consult  Pharmacy Consult for Heparin (while apixaban on hold) Indication: atrial fibrillation  No Known Allergies  Patient Measurements: Weight: 226 lb (102.513 kg)  Height: 74 inches IBW: 82.2 kg Heparin Dosing Weight: 102 kg  Vital Signs: Temp: 99.9 F (37.7 C) (11/22 1616) Temp Source: Rectal (11/22 1616) BP: 101/41 mmHg (11/22 1815) Pulse Rate: 66 (11/22 1815)  Labs:  Recent Labs  04/21/14 1520  HGB 13.4  HCT 39.1  PLT 214  CREATININE 3.09*    Estimated Creatinine Clearance: 22.3 mL/min (by C-G formula based on Cr of 3.09).   Medical History: Past Medical History  Diagnosis Date  . CAD (coronary artery disease), native coronary artery     Status post CABG  . Hypertension   . Hyperlipidemia   . GERD (gastroesophageal reflux disease)   . COPD (chronic obstructive pulmonary disease)   . CRF (chronic renal failure)   . Barrett's esophagus   . OP (osteoporosis)   . CAD (coronary artery disease) of artery bypass graft 07/04/2011    LIMA-LAD; SVG-OM2-OM3, SVG-RCA -- all patent as of 05/27/11  . Syncope 07/04/2011  . Pacemaker 07/04/2011  . Cardiomyopathy, ischemic 07/2011; 09/2012    2D Echo - EF 35-40, mod dilated LA; Relook Echo 09/2012: EF 40-45%  . Myocardial infarction     25 yrs ago  . Peripheral vascular disease   . Arthritis   . BPH (benign prostatic hyperplasia)   . Polyp of colon     removed  . Sleep apnea     STOP BANG SCORE 4  . AV block, 3rd degree     Post St. Jude pacemaker, EF 35-40, does have intermittent atrial tachycardia, either PAt or Afib  . BPH (benign prostatic hypertrophy) 11/15/2011  . Paroxysmal atrial fibrillation - now rate controlled (formally with [facility] and Hospital) 10/03/2012  . CVA (cerebral infarction) 10/09/2012    Noted to be in atrial fibrillation with evaluation of pacemaker, prior to CVA. Right-sided MCA stroke with left arm weakness/mild paralysis -- notably improved   . Pneumonia  02/02/2014  . Shortness of breath   . Stroke     left hand weakness    Assessment: 60 YOM recently discharged from [facility] on [facility]/16 to a SNF after being treated for PNA and acute on chronic systolic HF exacerbation who presented to the [facility] today, 11/22 with confusion, weakness, and SOB - r/o sepsis/PNA and HF exacerbation.   The patient was on apixaban PTA for Afib. The patient was on 5 mg bid as the usual baseline SCr is <1.5 however the patient presents with acute kidney injury today and SCr 3.09 - it is likely that the patient will hang on to the drug longer. Pharmacy has been consulted to transition the patient from apixaban to heparin this evening.   Given the patient's last dose of apixaban this morning - will plan to start heparin drip this evening. Will obtain HL and aPTT levels for now to help determine appropriate anticoagulation. No bolus.  Goal of Therapy:  Heparin level 0.3-0.7 units/ml  APTT 66-102 Monitor platelets by anticoagulation protocol: Yes   Plan:  1. Start heparin at a drip rate of 1400 units/hr (14 ml/hr) starting at 2200 2. Daily heparin level, aPTT for now 3. Will continue to monitor for any signs/symptoms of bleeding and will follow up with heparin level and aPTT in 8 hours   Georgina Pillion, PharmD, BCPS Clinical Pharmacist Pager: [phone] 04-21-2014 7:30 PM

## 2014-03-28 NOTE — ED Notes (Signed)
Admitting md at bedside to eval pt 

## 2014-03-28 NOTE — ED Notes (Signed)
Cardiologist at bedside.  

## 2014-03-28 NOTE — ED Notes (Signed)
Myself and Amy, NT placed pt on monitor, continuous pulse oximetry and blood pressure cuff; RT placed oxygen NRB (15L) on pt; pt was already in gown; EKG and vitals are being performed

## 2014-03-28 NOTE — ED Notes (Signed)
Phlebotomy at bedside.

## 2014-03-28 NOTE — ED Notes (Signed)
CBG 96. 

## 2014-03-28 NOTE — Consult Note (Signed)
CARDIOLOGY CONSULT NOTE   Patient ID: Richard Chavez MRN: [id number], DOB/AGE: 1926/12/16   Admit date: 03/30/2014 Date of Consult: 03/31/2014   Primary Physician: Thayer Headings, MD Primary Cardiologist: [name] Lemma, MD  Pt. Profile  75M with CAD s/p CABG, PAF s/p PPM for SSS, prior CVA, CKD (baseline Cr 1.1), COPD, and multiple hospital admissions for respiratory failure over the past few months who presents with AMS, hypotension, renal, and respiratory failure.   Problem List  Past Medical History  Diagnosis Date  . CAD (coronary artery disease), native coronary artery     Status post CABG  . Hypertension   . Hyperlipidemia   . GERD (gastroesophageal reflux disease)   . COPD (chronic obstructive pulmonary disease)   . CRF (chronic renal failure)   . Barrett's esophagus   . OP (osteoporosis)   . CAD (coronary artery disease) of artery bypass graft 07/04/2011    LIMA-LAD; SVG-OM2-OM3, SVG-RCA -- all patent as of 05/27/11  . Syncope 07/04/2011  . Pacemaker 07/04/2011  . Cardiomyopathy, ischemic 07/2011; 09/2012    2D Echo - EF 35-40, mod dilated LA; Relook Echo 09/2012: EF 40-45%  . Myocardial infarction     25 yrs ago  . Peripheral vascular disease   . Arthritis   . BPH (benign prostatic hyperplasia)   . Polyp of colon     removed  . Sleep apnea     STOP BANG SCORE 4  . AV block, 3rd degree     Post [employer] pacemaker, EF 35-40, does have intermittent atrial tachycardia, either PAt or Afib  . BPH (benign prostatic hypertrophy) 11/15/2011  . Paroxysmal atrial fibrillation - now rate controlled (formally with [facility] and Hospital) 10/03/2012  . CVA (cerebral infarction) 10/09/2012    Noted to be in atrial fibrillation with evaluation of pacemaker, prior to CVA. Right-sided MCA stroke with left arm weakness/mild paralysis -- notably improved   . Pneumonia 02/02/2014  . Shortness of breath   . Stroke     left hand weakness    Past Surgical History  Procedure Laterality Date    . Pacemaker placement  2007  . Cholecystectomy  2005  . Coronary artery bypass graft  1978    X3  . Lesion excision      from penis  . Cardiac catheterization  06/06/2011    occluded proximal RCA, ostial LAD and mid Circumflex after OM 1.; [place] LIMA-LAD, patent SVG-OM1-OM 2, patent SVG-RCA. Reduced ejection fraction of 30-35%.  . Cardiac catheterization  06/14/2010    LIMA to LAD, SVG to RCA, SVG to OM1 Circumflex, 100% occluded RCA, 100% occluded circumfkex, 100% occluded LAD, no evidence of graft dysfunction, continue medical therapy  . Cardiac catheterization  04/12/2003    3 vessel coronary artery disease, continue medical treatment  . Cardiac catheterization  10/07/2000    Severe 3 vessel coronary artery disease, continue medical treatment  . Doppler echocardiography  10/02/2012    EF 40-45%; possible grade 1 diastolic dysfunction, but A. fib precludes this. Mild atrial dilatation bilaterally. Moderately elevated pulmonary pressures of 40 mmHg.  . Prostate surgery       Allergies  No Known Allergies  HPI   75M with CAD s/p CABG, PAF s/p PPM for SSS, prior CVA, CKD (baseline Cr 1.1), COPD, and multiple hospital admissions for respiratory failure over the past few months who presents with AMS, hypotension, and respiratory failure.   [name] has declined over the past few months. In September, he lived  at home with substantial help from his son, and was able to walk with a walker. Since then he has had 3 admissions (prior to today) for respiratory failure (mixed, PNA and CHF).  Last admission was from 03/19/14-03/22/14 at [facility]. He was treated for PNA and diuresed 6L and discharged to [facility].   Per the patient's son, he continued to decline at rehab.  He had decreased PO intake, his lasix was stopped on 11/18, and they had actually talked about giving [name] fluids.  For the past 2 days, [name] demonstrated progressive fatigue and altered mental status. Today, in the setting of  hypotension (SBP 79) and hypoxemia, he was sent to the [facility] ED.   In the ED, he was initially hemodynamically stable with SBP 130s, saturating 98% on CPAP, which he tolerated only briefly. Tachypneic to 30s. He was nonverbal due to acute illness, moaning occasionally, which is not consistent with his baseline. While in the ED, BPs began to drop to the 80s. A GOC conversation was led by the admitting hospitalist and the patient was made DNR/DNI with plans for no ICU admission. Labs were notable for K 4.7, Cr 3.09 (baseline 1.1), lactate 2.82, ABG 7.38/34/98, WBC 9.5. BNP 40981 (03/20/14 BPN: 8409). was CXR demonstrated worsened bilateral airspace opacities, mostly likely related to progressive infection. Weight 226lbs, unchanged from discharge weight on 03/22/14.  He was given gentle IVF (75cc/hr) with some improvement in SBPs to around 100.   Inpatient Medications  . [START ON 03/29/2014] amiodarone  400 mg Oral Daily  . [START ON 03/29/2014] ceFEPime (MAXIPIME) IV  1 g Intravenous Q24H  . ipratropium  0.5 mg Nebulization 3 times per day  . levalbuterol  0.63 mg Nebulization Q8H  . methylPREDNISolone (SOLU-MEDROL) injection  40 mg Intravenous Q12H  . vancomycin  1,500 mg Intravenous Q48H    Family History Family History  Problem Relation Age of Onset  . Leukemia Father   . Heart disease Father   . Alzheimer's disease Mother   . Arthritis Mother   . Lung cancer Son 16  . Alzheimer's disease Maternal Grandmother      Social History History   Social History  . Marital Status: Widowed    Spouse Name: N/A    Number of Children: 1  . Years of Education: 8   Occupational History  . RETIRED [employer]   Social History Main Topics  . Smoking status: Former Smoker -- 0.30 packs/day for 15 years    Types: Cigarettes    Quit date: 05/07/1985  . Smokeless tobacco: Never Used  . Alcohol Use: No  . Drug Use: No  . Sexual Activity: No   Other Topics Concern  . Not on file    Social History Narrative   Patient's son Jesus Nevills lives with him at home.   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     Review of Systems  Unable to obtain  Physical Exam  Blood pressure 89/58, pulse 79, temperature 99.9 F (37.7 C), temperature source Rectal, resp. rate 20, weight 102.513 kg (226 lb), SpO2 87 %.  General: Elderly, frail, moaning occasionally Psych: somnolent Neuro: somnolent, moaning occasionally HEENT: dry tongue  Neck: No JVD. Lungs:  Labored respirations.  Diffuse crackles and rhonchi.  Heart: Regular. Distant.  Abdomen: Soft, non-tender, non-distended, BS + x 4.  Extremities: No clubbing, cyanosis. 2+ doughy LE edema b/l DP/PT 2+ and equal bilaterally.  Labs  No results for input(s): CKTOTAL, CKMB, TROPONINI in the last 72 hours. Lab Results  Component Value Date   WBC 9.5 03/16/2014   HGB 13.4 03/15/2014   HCT 39.1 04/05/2014   MCV 82.3 03/21/2014   PLT 214 03/21/2014    Recent Labs Lab 03/31/2014 1520  NA 132*  K 4.7  CL 93*  CO2 19  BUN 77*  CREATININE 3.09*  CALCIUM 9.0  GLUCOSE 89   Lab Results  Component Value Date   CHOL 92 10/02/2012   HDL 45 10/02/2012   LDLCALC 26 10/02/2012   TRIG 106 10/02/2012   No results found for: DDIMER  Radiology/Studies  Dg Chest Portable 1 View  03/18/2014   CLINICAL DATA:  Per ED note: Pt arrived by [facility] from [facility]. Pt was just dc from [facility] on Monday for pneumonia. Called ems for hypotension and resp distress. Wheezing/crackles noted by ems, given neb tx pta, diminished lower lung sounds, altered mental status, did not tolerate cpap pta.  EXAM: PORTABLE CHEST - 1 VIEW  COMPARISON:  03/19/2014  FINDINGS: Right-sided pacemaker and sternotomy wires are unchanged. Lungs are  adequately inflated with diffuse bilateral airspace opacification which is worse and likely represents infection although cannot exclude a component of interstitial edema. No definite effusion. Moderate stable cardiomegaly. Remainder of the exam is unchanged.  IMPRESSION: Worsening diffuse bilateral airspace process likely infection, although cannot exclude a component interstitial edema.  Stable cardiomegaly.   Electronically Signed   By: Elberta Fortis M.D.   On: 03/12/2014 15:01   Dg Chest Port 1 View  03/19/2014   CLINICAL DATA:  Weakness, hypotensive  EXAM: PORTABLE CHEST - 1 VIEW  COMPARISON:  None.  FINDINGS: Chronic interstitial/airspace opacities, suspicious for chronic interstitial lung disease. Superimposed pneumonia is difficult to exclude. No pleural effusion or pneumothorax.  Cardiomegaly. Postsurgical changes related to prior CABG. Right subclavian pacemaker.  IMPRESSION: Suspected chronic interstitial lung disease. Superimposed pneumonia is difficult to exclude.   Electronically Signed   By: Charline Bills M.D.   On: 03/19/2014 13:05    ECG  03/29/2014 NSR, 1st degree HB, IVCD, NSSTTWC. Old anteroseptal infarct. 03/19/14: A paced with prolonged PR and apparent failure to inhibit V pacing  ASSESSMENT AND PLAN  9M with CAD s/p CABG, PAF s/p PPM for SSS, prior CVA, CKD (baseline Cr 1.1), COPD, and multiple hospital admissions for respiratory failure over the past few months who presents with AMS, hypotension, renal, and respiratory failure.   He has respiratory failure due to progressive bilateral pneumonia with septic shock, lactic acidosis, altered mental status, and acute on chronic renal failure. Although the BNP is elevated compared to his most recent check, I suspect that CHF is contributing very little if at all to the respiratory failure at this moment. Elevated BNP may be a reflection of sepsis induced myocardial dysfunction. His lack of PO intake for the past few days, current  exam, and stable weight compared with discharge a few days ago are consistent with being euvolemic to dry. The CXR demonstrates diffuse bilateral airspace disease most consistent with a PNA, although some CHF is a possibility.   Given Mr. Kelsch trajectory over the past few months in the setting of the acuity of his current illness, it is appropriate to change GOC  to DNR/DNI and limit current care to include only non-ICU interventions.   1. Hold diuretics and antihypertensives given shock 2. Agree with gentle IV fluid administration over the next few hours to achieve normal BP and UOP near 50cc/hr, with a low threshold to discontinue if respiratory status worsens. 3. PNA and COPD tx per hospitalist service 4. Agree with transition to comfort measures if abx, nebs, steroids, and gentle fluids fail.  Neva SeatSigned, Jaylenne Hamelin, MD 03/01/2014, 5:45 PM

## 2014-03-28 NOTE — H&P (Signed)
PATIENT DETAILS Name: Richard Chavez Age: 80 y.o. Sex: male Date of Birth: 09-May-1926 Admit Date: 04/13/14 ZOX:[id number], MD   CHIEF COMPLAINT:  Worsening confusion,weakness, Shortness of breath-since this past wednesday  HPI: Richard Chavez is a 80 y.o. male with a Past Medical History of ischemic cardiomyopathy with chronic systolic heart failure last EF around 20-25%, COPD on chronic prednisone, history of CAD status post CABG, with numerous recurrent admissions in the past 1-2 months for pneumonia/CHF who presents today with the above noted complaint. Please note, patient not able to provide history, history obtained from patient's son at bedside. Patient was just discharged from [facility] on 03/22/14 after being treated for possible pneumonia and acute on chronic systolic heart failure. [name] Apparently since this past Wednesday, patient has had slow deterioration in his mental status, he has become more confused and significantly more weak. He has stopped ambulating for the past few days. Earlier today, he was found to be in respiratory distress, EMS was subsequently called to his facility, patient was then brought to the emergency room for further evaluation and treatment. Per ED note, EMS was unable to place a CPAP mask due to lack of dentition and along facial structure, he was then placed on 100% nonrebreather mask. Further blood work showed a creatinine of 3.09, chest x-ray showed of bilateral infiltrates-either pneumonia or pulmonary edema. I was asked to admit this patient for further evaluation and treatment.  ALLERGIES:  No Known Allergies  PAST MEDICAL HISTORY: Past Medical History  Diagnosis Date  . CAD (coronary artery disease), native coronary artery     Status post CABG  . Hypertension   . Hyperlipidemia   . GERD (gastroesophageal reflux disease)   . COPD (chronic obstructive pulmonary disease)   .  CRF (chronic renal failure)   . Barrett's esophagus   . OP (osteoporosis)   . CAD (coronary artery disease) of artery bypass graft 07/04/2011    LIMA-LAD; SVG-OM2-OM3, SVG-RCA -- all patent as of 05/27/11  . Syncope 07/04/2011  . Pacemaker 07/04/2011  . Cardiomyopathy, ischemic 07/2011; 09/2012    2D Echo - EF 35-40, mod dilated LA; Relook Echo 09/2012: EF 40-45%  . Myocardial infarction     25 yrs ago  . Peripheral vascular disease   . Arthritis   . BPH (benign prostatic hyperplasia)   . Polyp of colon     removed  . Sleep apnea     STOP BANG SCORE 4  . AV block, 3rd degree     Post St. Jude pacemaker, EF 35-40, does have intermittent atrial tachycardia, either PAt or Afib  . BPH (benign prostatic hypertrophy) 11/15/2011  . Paroxysmal atrial fibrillation - now rate controlled (formally with [facility] Hospital) 10/03/2012  . CVA (cerebral infarction) 10/09/2012    Noted to be in atrial fibrillation with evaluation of pacemaker, prior to CVA. Right-sided MCA stroke with left arm weakness/mild paralysis -- notably improved   . Pneumonia 02/02/2014  . Shortness of breath   . Stroke     left hand weakness    PAST SURGICAL HISTORY: Past Surgical History  Procedure Laterality Date  . Pacemaker placement  2007  . Cholecystectomy  2005  . Coronary artery bypass graft  1978    X3  . Lesion excision      from penis  . Cardiac catheterization  06/06/2011    occluded proximal RCA, ostial LAD  and mid Circumflex after OM 1.; [place] LIMA-LAD, patent SVG-OM1-OM 2, patent SVG-RCA. Reduced ejection fraction of 30-35%.  . Cardiac catheterization  06/14/2010    LIMA to LAD, SVG to RCA, SVG to OM1 Circumflex, 100% occluded RCA, 100% occluded circumfkex, 100% occluded LAD, no evidence of graft dysfunction, continue medical therapy  . Cardiac catheterization  04/12/2003    3 vessel coronary artery disease, continue medical treatment  . Cardiac catheterization  10/07/2000    Severe 3 vessel coronary artery  disease, continue medical treatment  . Doppler echocardiography  10/02/2012    EF 40-45%; possible grade 1 diastolic dysfunction, but A. fib precludes this. Mild atrial dilatation bilaterally. Moderately elevated pulmonary pressures of 40 mmHg.  . Prostate surgery      MEDICATIONS AT HOME: Prior to Admission medications   Medication Sig Start Date End Date Taking? Authorizing Provider  acetaminophen (TYLENOL) 325 MG tablet Take 650 mg by mouth every 6 (six) hours as needed for fever (pain).   Yes Historical Provider, MD  albuterol-ipratropium (COMBIVENT) 18-103 MCG/ACT inhaler Inhale 2 puffs into the lungs 2 (two) times daily.    Yes Historical Provider, MD  amiodarone (PACERONE) 400 MG tablet Take 1 tablet (400 mg total) by mouth daily. 02/06/14  Yes Marinda Elk, MD  apixaban (ELIQUIS) 5 MG TABS tablet Take 1 tablet (5 mg total) by mouth 2 (two) times daily. 11/26/13  Yes Marykay Lex, MD  atorvastatin (LIPITOR) 20 MG tablet Take 20 mg by mouth every evening.    Yes Historical Provider, MD  budesonide-formoterol (SYMBICORT) 160-4.5 MCG/ACT inhaler Inhale 2 puffs into the lungs 2 (two) times daily.   Yes Historical Provider, MD  fluticasone (FLONASE) 50 MCG/ACT nasal spray Place 1 spray into both nostrils daily as needed for allergies or rhinitis.    Yes Historical Provider, MD  furosemide (LASIX) 40 MG tablet Take 40 mg by mouth daily.   Yes Historical Provider, MD  gabapentin (NEURONTIN) 300 MG capsule Take 900 mg by mouth 2 (two) times daily.    Yes Historical Provider, MD  guaiFENesin (MUCINEX) 600 MG 12 hr tablet Take 1,200 mg by mouth 2 (two) times daily.   Yes Historical Provider, MD  lisinopril (PRINIVIL,ZESTRIL) 2.5 MG tablet Take 0.5 tablets (1.25 mg total) by mouth daily. 02/06/14  Yes Marinda Elk, MD  loratadine (CLARITIN) 10 MG tablet Take 10 mg by mouth daily with breakfast.    Yes Historical Provider, MD  Magnesium Chloride (SLOW-MAG) 535 (64 MG) MG TBCR Take 64 mg  by mouth 2 (two) times daily.    Yes Historical Provider, MD  metoprolol (LOPRESSOR) 100 MG tablet Take 100 mg by mouth 2 (two) times daily.   Yes Historical Provider, MD  Omega-3 Fatty Acids (FISH OIL) 1000 MG CAPS Take 1,000 mg by mouth every evening.    Yes Historical Provider, MD  polyethylene glycol (MIRALAX / GLYCOLAX) packet Take 17 g by mouth daily.   Yes Historical Provider, MD  potassium chloride 20 MEQ/15ML (10%) solution Take 15 mLs (20 mEq total) by mouth daily. Patient taking differently: Take 20 mEq by mouth every evening.  02/06/14  Yes Marinda Elk, MD  predniSONE (DELTASONE) 5 MG tablet Take 5 mg by mouth daily.   Yes Historical Provider, MD  ranitidine (ZANTAC) 150 MG capsule Take 150 mg by mouth 2 (two) times daily.    Yes Historical Provider, MD  saccharomyces boulardii (FLORASTOR) 250 MG capsule Take 250 mg by mouth 2 (two) times daily.  For 2 weeks   Yes Historical Provider, MD  tiotropium (SPIRIVA) 18 MCG inhalation capsule Place 18 mcg into inhaler and inhale daily with breakfast.    Yes Historical Provider, MD  Vitamin D, Ergocalciferol, (DRISDOL) 50000 UNITS CAPS Take 50,000 Units by mouth every 7 (seven) days. Take on Fridays   Yes Historical Provider, MD  vitamin E 1000 UNIT capsule Take 1,000 Units by mouth daily with breakfast.    Yes Historical Provider, MD  furosemide (LASIX) 40 MG tablet Take 1 tablet (40 mg total) by mouth daily. Patient not taking: Reported on 03/19/2014 02/06/14   Marinda Elk, MD    FAMILY HISTORY: Family History  Problem Relation Age of Onset  . Leukemia Father   . Heart disease Father   . Alzheimer's disease Mother   . Arthritis Mother   . Lung cancer Son 21  . Alzheimer's disease Maternal Grandmother     SOCIAL HISTORY:  reports that he quit smoking about 28 years ago. His smoking use included Cigarettes. He has a 4.5 pack-year smoking history. He has never used smokeless tobacco. He reports that he does not drink  alcohol or use illicit drugs.  REVIEW OF SYSTEMS: Unable to be obtained.  PHYSICAL EXAM: Blood pressure 83/54, pulse 76, temperature 99.9 F (37.7 C), temperature source Rectal, resp. rate 20, weight 102.513 kg (226 lb), SpO2 100 %.  General appearance: Lethargic, confused. Only following some occasional commands. Mumbles incoherently. HEENT: Atraumatic and Normocephalic, pupils equally reactive to light and accomodation Neck: supple Chest:Good air entry bilaterally, coarse rales heard bilaterally up to mid zone. CVS: S1 S2 regular, coarse lung sounds/rales impede further auscultation. Abdomen: Bowel sounds present, Non tender and not distended with no gaurding, rigidity or rebound. Extremities: B/L Lower Ext shows 1+edema, both legs are warm to touch Neurology:Difficult to evaluate-given mental status-but seems to spontaneously move all ext occasionally Skin:No Rash Wounds:N/A  LABS ON ADMISSION:   Recent Labs  03/25/2014 1520  NA 132*  K 4.7  CL 93*  CO2 19  GLUCOSE 89  BUN 77*  CREATININE 3.09*  CALCIUM 9.0   No results for input(s): AST, ALT, ALKPHOS, BILITOT, PROT, ALBUMIN in the last 72 hours. No results for input(s): LIPASE, AMYLASE in the last 72 hours.  Recent Labs  03/31/2014 1520  WBC 9.5  NEUTROABS 7.2  HGB 13.4  HCT 39.1  MCV 82.3  PLT 214   No results for input(s): CKTOTAL, CKMB, CKMBINDEX, TROPONINI in the last 72 hours. No results for input(s): DDIMER in the last 72 hours. Invalid input(s): POCBNP   RADIOLOGIC STUDIES ON ADMISSION: Dg Chest Portable 1 View  03/07/2014   CLINICAL DATA:  Per ED note: Pt arrived by [facility] from [facility]. Pt was just dc from [facility] on Monday for pneumonia. Called ems for hypotension and resp distress. Wheezing/crackles noted by ems, given neb tx pta, diminished lower lung sounds, altered mental status, did not tolerate cpap pta.  EXAM: PORTABLE CHEST - 1 VIEW  COMPARISON:  03/19/2014  FINDINGS: Right-sided pacemaker and  sternotomy wires are unchanged. Lungs are adequately inflated with diffuse bilateral airspace opacification which is worse and likely represents infection although cannot exclude a component of interstitial edema. No definite effusion. Moderate stable cardiomegaly. Remainder of the exam is unchanged.  IMPRESSION: Worsening diffuse bilateral airspace process likely infection, although cannot exclude a component interstitial edema.  Stable cardiomegaly.   Electronically Signed   By: Elberta Fortis M.D.   On: 03/31/2014 15:01  EKG: Independently reviewed. NSR with artifacts  ASSESSMENT AND PLAN: Present on Admission:  . Acute respiratory failure: Suspect this is multifactorial, likely more from healthcare associated pneumonia. However given history of severe chronic systolic heart failure, very difficult to rule out ongoing pulmonary edema-he does not appear significantly volume overloaded-does have some mild peripheral edema. Very difficult situation with development of renal failure, unfortunately patient is very frail, ongoing failure to thrive and carries a very poor overall prognosis. Patient's son at bedside [name]([name]) aware of very poor overall prognosis, and also aware that patient would be a very poor candidate for invasive/aggressive care. After a long discussion at bedside, he has agreed that we would not commence heroic measures, he has agreed for DO NOT RESUSCITATE status, no central line placement for vasopressors or inotropic agents. We have agreed to give a trial of fluids for a few hours along with IV antibiotics. If patient were to deteriorate, we would then commence comfort measures.  -Given borderline hypotension-hold diuretics, antihypertensive medications. Will monitor in step down overnight, and closely follow patient. I have asked cardiology to evaluate as well.  . Sepsis: Suspect secondary to healthcare associated pneumonia. Highly suspect some amount of aspiration. Will be on empiric  vancomycin and cefepime. Borderline hypertension, trying normal saline at a low rate for a few hours. Poor prognosis-see discussion above  . HCAP (healthcare-associated pneumonia): Chest x-ray shows bilateral infiltrates-BNP pending, will check pro-calcitonin. However lactic acid mildly elevated-not sure if this is secondary to hypoperfusion from developing sepsis or from worsening underlying CHF. Will be on empiric antibiotics, if continues to deteriorate, comfort measures will need to be initiated.  . Acute renal failure: Either from sepsis, or development of cardiorenal syndrome. Either way, carries very poor overall prognosis. Bladder scanned in the emergency room, only 80 mL of urine. Will go head and place Foley catheter, for close intake/output monitoring and for comfort measures as well.  .? Acute on Chronic systolic CHF (congestive heart failure): At this point, does not appear to be significantly volume overloaded. Not sure if respiratory failure is from CHF exacerbation. Given history of recurrent hospitalizations, encephalopathy-suspect some component of pneumonia as well. Trying some fluids at this time, awaiting BNP. Will order pro-calcitonin. I have asked cardiology to consult as well.  . Paroxysmal atrial fibrillation: On amiodarone-will continue if able to swallow. HoldEliquis, start heparin infusion. If deteriorates, will stop all anticoagulation and transition to comfort care.   Marland Kitchen. COPD (chronic obstructive pulmonary disease): On chronic prednisone, suspect would not be able to take oral medications at this time. I will place on Solu-Medrol 40 mg twice a day, and start some scheduled bronchodilators. No wheezing, mostly rales heard on exam.   . CAD CABG 1998, patent grafts 06/06/11 after abn Myoview: Do not think cycling cardiac enzymes would change management in this situation. Initial point-of-care troponins negative. Has acute respiratory failure from pulmonary edema or pneumonia.  Resume oral medications when/if able.   . Palliative Care: Long discussion with son-[name] at bedside by this M.D. Son is aware of poor overall prognosis, and development of respiratory failure either from CHF or PNA,and renal failure. Irrespective of underlying CHF decompensation/sepsis/pneumonia, patient carries a very poor overall prognosis, appears very frail. Development of renal failure is particularly very concerning, with his history of severe systolic heart failure, cardiorenal syndrome is a possibility. I suspect even with aggressive care it will not add any quality/longevity to this patient's life. After long discussion, we have agreed on a DO NOT  RESUSCITATE status. No central lines for vasopressors or inotropic agents. Apart from antibiotics, and adjustment of medications, no further escalation in care. I have consulted palliative care. I suspect, patient may in fact not survive this hospitalization.  Further plan will depend as patient's clinical course evolves and further radiologic and laboratory data become available. Patient will be monitored closely.  DVT Prophylaxis: Heparin gtt for now  Code Status: DNR   Total time spent for admission equals 65 minutes.  [facility] Pager [phone]  If 7PM-7AM, please contact night-coverage [email or web address] Password [facility] 04/05/2014, 5:51 PM

## 2014-03-28 NOTE — Progress Notes (Signed)
ANTIBIOTIC CONSULT NOTE - INITIAL  Pharmacy Consult for Vancomycin Indication: pneumonia  No Known Allergies  Patient Measurements: Weight: 226 lb (102.513 kg) Adjusted Body Weight: 90 kg  Vital Signs: Temp: 99.9 F (37.7 C) (11/22 1616) Temp Source: Rectal (11/22 1616) BP: 112/95 mmHg (11/22 1634) Pulse Rate: 79 (11/22 1634) Intake/Output from previous day:   Intake/Output from this shift:    Labs:  Recent Labs  April 13, 2014 1520  WBC 9.5  HGB 13.4  PLT 214  CREATININE 3.09*   Estimated Creatinine Clearance: 22.3 mL/min (by C-G formula based on Cr of 3.09). No results for input(s): VANCOTROUGH, VANCOPEAK, VANCORANDOM, GENTTROUGH, GENTPEAK, GENTRANDOM, TOBRATROUGH, TOBRAPEAK, TOBRARND, AMIKACINPEAK, AMIKACINTROU, AMIKACIN in the last 72 hours.   Microbiology: Recent Results (from the past 720 hour(s))  Blood Culture (routine x 2)     Status: None   Collection Time: 03/19/14 12:33 PM  Result Value Ref Range Status   Specimen Description BLOOD RIGHT ANTECUBITAL  Final   Special Requests BOTTLES DRAWN AEROBIC AND ANAEROBIC  Final   Culture  Setup Time   Final    03/19/2014 16:33 Performed at [facility]    Culture   Final    NO GROWTH 5 DAYS Performed at [facility]    Report Status 03/25/2014 FINAL  Final  Urine culture     Status: None   Collection Time: 03/19/14 12:49 PM  Result Value Ref Range Status   Specimen Description URINE, CATHETERIZED  Final   Special Requests NONE  Final   Culture  Setup Time   Final    03/19/2014 13:54 Performed at [facility] Count   Final    50,000 COLONIES/ML Performed at [facility]    Culture YEAST Performed at [facility]   Final   Report Status 03/20/2014 FINAL  Final  Blood Culture (routine x 2)     Status: None   Collection Time: 03/19/14  1:03 PM  Result Value Ref Range Status   Specimen Description BLOOD RIGHT ARM  Final   Special Requests BOTTLES DRAWN  AEROBIC AND ANAEROBIC  Final   Culture  Setup Time   Final    03/19/2014 16:33 Performed at [facility]    Culture   Final    NO GROWTH 5 DAYS Performed at [facility]    Report Status 03/25/2014 FINAL  Final    Medical History: Past Medical History  Diagnosis Date  . CAD (coronary artery disease), native coronary artery     Status post CABG  . Hypertension   . Hyperlipidemia   . GERD (gastroesophageal reflux disease)   . COPD (chronic obstructive pulmonary disease)   . CRF (chronic renal failure)   . Barrett's esophagus   . OP (osteoporosis)   . CAD (coronary artery disease) of artery bypass graft 07/04/2011    LIMA-LAD; SVG-OM2-OM3, SVG-RCA -- all patent as of 05/27/11  . Syncope 07/04/2011  . Pacemaker 07/04/2011  . Cardiomyopathy, ischemic 07/2011; 09/2012    2D Echo - EF 35-40, mod dilated LA; Relook Echo 09/2012: EF 40-45%  . Myocardial infarction     25 yrs ago  . Peripheral vascular disease   . Arthritis   . BPH (benign prostatic hyperplasia)   . Polyp of colon     removed  . Sleep apnea     STOP BANG SCORE 4  . AV block, 3rd degree     Post St. Jude pacemaker, EF  35-40, does have intermittent atrial tachycardia, either PAt or Afib  . BPH (benign prostatic hypertrophy) 11/15/2011  . Paroxysmal atrial fibrillation - now rate controlled (formally with [facility] and [facility]) 10/03/2012  . CVA (cerebral infarction) 10/09/2012    Noted to be in atrial fibrillation with evaluation of pacemaker, prior to CVA. Right-sided MCA stroke with left arm weakness/mild paralysis -- notably improved   . Pneumonia 02/02/2014  . Shortness of breath   . Stroke     left hand weakness    Medications:   (Not in a hospital admission)  Assessment: 80yo male presents to ED with AMS and weakness x 2 days. Pharmacy is consulted to dose vancomycin for possible pneumonia. Pt is febrile to 99.9, WBC 9.5, sCr 3.09 with CrCl ~ 18 mL/min. Patient received 1 dose of cefepime 1g in  ED.   Goal of Therapy:  Vancomycin trough level 15-20 mcg/ml  Plan:  Vancomycin 1500 mg IV q48h F/u on cefepime dosing Measure antibiotic drug levels at steady state Follow up culture results  Arlean Hoppingorey M. Newman PiesBall, PharmD Clinical Pharmacist Pager [phone] 2013/07/12,4:43 PM

## 2014-03-28 NOTE — Progress Notes (Signed)
Pt received from ED CHG bath & MRSA swab done. Placed on monitor and VS done.

## 2014-03-28 NOTE — ED Notes (Signed)
Pt arrived by [facility] from [facility]. Pt was just dc from [facility] on Monday for pneumonia. Called ems for hypotension and resp distress. Wheezing/crackles noted by ems, given neb tx pta, diminished lower lung sounds, altered mental status, did not tolerate cpap pta.

## 2014-03-28 NOTE — ED Notes (Signed)
Attempted report to 2C.  

## 2014-03-29 ENCOUNTER — Inpatient Hospital Stay (HOSPITAL_COMMUNITY): Payer: Medicare Other

## 2014-03-29 DIAGNOSIS — R0602 Shortness of breath: Secondary | ICD-10-CM | POA: Insufficient documentation

## 2014-03-29 DIAGNOSIS — Z515 Encounter for palliative care: Secondary | ICD-10-CM

## 2014-03-29 DIAGNOSIS — R0600 Dyspnea, unspecified: Secondary | ICD-10-CM

## 2014-03-29 DIAGNOSIS — R0689 Other abnormalities of breathing: Secondary | ICD-10-CM

## 2014-03-29 DIAGNOSIS — I5021 Acute systolic (congestive) heart failure: Secondary | ICD-10-CM | POA: Insufficient documentation

## 2014-03-29 DIAGNOSIS — I480 Paroxysmal atrial fibrillation: Secondary | ICD-10-CM

## 2014-03-29 DIAGNOSIS — N178 Other acute kidney failure: Secondary | ICD-10-CM

## 2014-03-29 DIAGNOSIS — N179 Acute kidney failure, unspecified: Secondary | ICD-10-CM | POA: Insufficient documentation

## 2014-03-29 LAB — BASIC METABOLIC PANEL
Anion gap: 21 — ABNORMAL HIGH (ref 5–15)
BUN: 88 mg/dL — ABNORMAL HIGH (ref 6–23)
CO2: 16 mEq/L — ABNORMAL LOW (ref 19–32)
Calcium: 8.3 mg/dL — ABNORMAL LOW (ref 8.4–10.5)
Chloride: 99 mEq/L (ref 96–112)
Creatinine, Ser: 3.2 mg/dL — ABNORMAL HIGH (ref 0.50–1.35)
GFR calc non Af Amer: 16 mL/min — ABNORMAL LOW (ref >90)
GFR, EST AFRICAN AMERICAN: 19 mL/min — AB (ref >90)
GLUCOSE: 132 mg/dL — AB (ref 70–99)
POTASSIUM: 5 meq/L (ref 3.7–5.3)
SODIUM: 136 meq/L — AB (ref 137–147)

## 2014-03-29 LAB — APTT
APTT: 175 s — AB (ref 24–37)
APTT: 132 s — AB (ref 24–37)
aPTT: >200 seconds (ref 24–37)

## 2014-03-29 LAB — HEPARIN LEVEL (UNFRACTIONATED)
Heparin Unfractionated: >2.2 IU/mL — ABNORMAL HIGH (ref 0.30–0.70)
Heparin Unfractionated: >2.2 IU/mL — ABNORMAL HIGH (ref 0.30–0.70)

## 2014-03-29 LAB — HIV ANTIBODY (ROUTINE TESTING W REFLEX): HIV: NONREACTIVE

## 2014-03-29 MED ORDER — FUROSEMIDE 10 MG/ML IJ SOLN
INTRAMUSCULAR | Status: AC
  Filled 2014-03-29: qty 8

## 2014-03-29 MED ORDER — MORPHINE SULFATE (CONCENTRATE) 10 MG/0.5ML PO SOLN: 5.0000 mg | ORAL | Status: DC | PRN

## 2014-03-29 MED ORDER — IPRATROPIUM BROMIDE 0.02 % IN SOLN
0.5000 mg | Freq: Three times a day (TID) | RESPIRATORY_TRACT | Status: DC
  Administered 2014-03-29 – 2014-03-31 (×7): 0.5 mg via RESPIRATORY_TRACT
  Filled 2014-03-29 (×7): qty 2.5

## 2014-03-29 MED ORDER — CEFEPIME HCL 1 G IJ SOLR
1.0000 g | INTRAMUSCULAR | Status: DC
  Administered 2014-03-29 – 2014-03-30 (×2): 1 g via INTRAVENOUS
  Filled 2014-03-29 (×3): qty 1

## 2014-03-29 MED ORDER — BISACODYL 10 MG RE SUPP: 10.0000 mg | Freq: Every day | RECTAL | Status: DC | PRN

## 2014-03-29 MED ORDER — LEVALBUTEROL HCL 0.63 MG/3ML IN NEBU
0.6300 mg | INHALATION_SOLUTION | Freq: Three times a day (TID) | RESPIRATORY_TRACT | Status: DC
  Administered 2014-03-29 – 2014-03-31 (×7): 0.63 mg via RESPIRATORY_TRACT
  Filled 2014-03-29 (×15): qty 3

## 2014-03-29 MED ORDER — LEVALBUTEROL HCL 0.63 MG/3ML IN NEBU: 0.6300 mg | INHALATION_SOLUTION | Freq: Four times a day (QID) | RESPIRATORY_TRACT | Status: DC | PRN

## 2014-03-29 MED ORDER — LORAZEPAM 2 MG/ML IJ SOLN
0.5000 mg | INTRAMUSCULAR | Status: DC | PRN
  Administered 2014-03-29 (×2): 0.5 mg via INTRAVENOUS
  Filled 2014-03-29 (×2): qty 1

## 2014-03-29 MED ORDER — MORPHINE SULFATE 2 MG/ML IJ SOLN
0.5000 mg | INTRAMUSCULAR | Status: DC | PRN
  Administered 2014-03-29 – 2014-03-31 (×2): 1 mg via INTRAVENOUS
  Filled 2014-03-29 (×2): qty 1

## 2014-03-29 MED ORDER — FUROSEMIDE 10 MG/ML IJ SOLN
80.0000 mg | Freq: Once | INTRAMUSCULAR | Status: AC
  Administered 2014-03-29: 80 mg via INTRAVENOUS
  Filled 2014-03-29: qty 8

## 2014-03-29 MED ORDER — FUROSEMIDE 10 MG/ML IJ SOLN
60.0000 mg | Freq: Once | INTRAMUSCULAR | Status: AC
  Administered 2014-03-29: 60 mg via INTRAVENOUS

## 2014-03-29 MED ORDER — HEPARIN (PORCINE) IN NACL 100-0.45 UNIT/ML-% IJ SOLN
950.0000 [IU]/h | INTRAMUSCULAR | Status: DC
  Administered 2014-03-31: 950 [IU]/h via INTRAVENOUS
  Filled 2014-03-29 (×2): qty 250

## 2014-03-29 NOTE — Consult Note (Signed)
Patient GN:FAOZ:Richard Chavez      DOB: Mar 31, 1927      HYQ:[id number]     Consult Note from the Palliative Medicine Team at [facility]    Consult Requested by: Dr. Jerral RalphGhimire     PCP: Thayer HeadingsMACKENZIE,BRIAN, MD Reason for Consultation: GOC     Phone Number:[phone]  Assessment of patients Current state: Mr. Richard Chavez is very restless and agitated when I enter the room. His son, Richard Chavez, is at bedside. I discussed with Richard Chavez his poor prognosis and that his renal function is slightly worse and there is not much change on his CXR today. Richard Chavez understands that his father has a very poor prognosis but he wishes to continue antibiotics and medications to attempt any improvement - I will add low dose Ativan and Roxanol prn. He also agrees to add some medications for comfort to address agitation and shortness of breath. Richard Chavez also says that Mr. Richard Chavez has always had some problems with constipation and that he was incontinent at [facility]. Richard Chavez tells me that his father had a stroke last year and has needed more help but that he was "doing good for 10 months" until September of this year. Since September it has been constant infection and hospitalizations and was becoming difficult for Richard Chavez to care for him at him with most recently he was requiring a wheelchair to get around. Mr. Richard Chavez is a very spiritual person, per his son, and his church pastor has been contacted for support - offered chaplain services but not desired at this time and Richard Chavez will let us know if he changes his mind. I will continue to follow and support.    Goals of Care: 1.  Code Status: DNR   2. Scope of Treatment: Continue antibiotics and medical treatment per discussion with Dr. Jerral RalphGhimire. Medications added for comfort as discussed with son.    4. Disposition: To be determined on outcomes.    3. Symptom Management:   1. Anxiety/Agitation: Lorazepam 0.5 mg every 4 hours prn.  2. Shortness of breath: Roxanol 5 mg every 2 hours prn.   3. Constipation: Dulcolax supp daily prn.   4. Psychosocial: Emotional support provided to patient and family at bedside.   5. Spiritual: Their personal pastor has been notified and hopefully to visit and support. Offered chaplain services but not desired at this time.    Brief HPI: 80 yo admitted with weakness, confusion, and shortness of breath r/t HCAP and acute on chronic systolic heart failure (EF 20-25%). Also admitted with acute renal failure with creatinine of 3.09. PMH significant for atrial fibrillation, COPD, CAD, CABG, HTN, GERD, Barrett's esophagus, MI, colon polyp, sleep apnea, CVA, BPH.    ROS: Unable to assess - confused.     PMH:  Past Medical History  Diagnosis Date  . CAD (coronary artery disease), native coronary artery     Status post CABG  . Hypertension   . Hyperlipidemia   . GERD (gastroesophageal reflux disease)   . COPD (chronic obstructive pulmonary disease)   . CRF (chronic renal failure)   . Barrett's esophagus   . OP (osteoporosis)   . CAD (coronary artery disease) of artery bypass graft 07/04/2011    LIMA-LAD; SVG-OM2-OM3, SVG-RCA -- all patent as of 05/27/11  . Syncope 07/04/2011  . Pacemaker 07/04/2011  . Cardiomyopathy, ischemic 07/2011; 09/2012    2D Echo - EF 35-40, mod dilated LA; Relook Echo 09/2012: EF 40-45%  . Myocardial infarction     25 yrs ago  .  Peripheral vascular disease   . Arthritis   . BPH (benign prostatic hyperplasia)   . Polyp of colon     removed  . Sleep apnea     STOP BANG SCORE 4  . AV block, 3rd degree     Post St. Jude pacemaker, EF 35-40, does have intermittent atrial tachycardia, either PAt or Afib  . BPH (benign prostatic hypertrophy) 11/15/2011  . Paroxysmal atrial fibrillation - now rate controlled (formally with [facility] and Hospital) 10/03/2012  . CVA (cerebral infarction) 10/09/2012    Noted to be in atrial fibrillation with evaluation of pacemaker, prior to CVA. Right-sided MCA stroke with left arm weakness/mild  paralysis -- notably improved   . Pneumonia 02/02/2014  . Shortness of breath   . Stroke     left hand weakness     PSH: Past Surgical History  Procedure Laterality Date  . Pacemaker placement  2007  . Cholecystectomy  2005  . Coronary artery bypass graft  1978    X3  . Lesion excision      from penis  . Cardiac catheterization  06/06/2011    occluded proximal RCA, ostial LAD and mid Circumflex after OM 1.; [place] LIMA-LAD, patent SVG-OM1-OM 2, patent SVG-RCA. Reduced ejection fraction of 30-35%.  . Cardiac catheterization  06/14/2010    LIMA to LAD, SVG to RCA, SVG to OM1 Circumflex, 100% occluded RCA, 100% occluded circumfkex, 100% occluded LAD, no evidence of graft dysfunction, continue medical therapy  . Cardiac catheterization  04/12/2003    3 vessel coronary artery disease, continue medical treatment  . Cardiac catheterization  10/07/2000    Severe 3 vessel coronary artery disease, continue medical treatment  . Doppler echocardiography  10/02/2012    EF 40-45%; possible grade 1 diastolic dysfunction, but A. fib precludes this. Mild atrial dilatation bilaterally. Moderately elevated pulmonary pressures of 40 mmHg.  . Prostate surgery     I have reviewed the FH and SH and  If appropriate update it with new information. No Known Allergies Scheduled Meds: . amiodarone  400 mg Oral Daily  . antiseptic oral rinse  7 mL Mouth Rinse q12n4p  . ceFEPime (MAXIPIME) IV  1 g Intravenous Q24H  . chlorhexidine  15 mL Mouth Rinse BID  . ipratropium  0.5 mg Nebulization TID  . levalbuterol  0.63 mg Nebulization TID  . methylPREDNISolone (SOLU-MEDROL) injection  40 mg Intravenous Q12H  . vancomycin  1,500 mg Intravenous Q48H   Continuous Infusions: . heparin 1,400 Units/hr (03/21/2014 2158)   PRN Meds:.acetaminophen, levalbuterol, ondansetron (ZOFRAN) IV    BP 109/35 mmHg  Pulse 75  Temp(Src) 97.5 F (36.4 C) (Axillary)  Resp 22  Ht 6' (1.829 m)  Wt 99.7 kg (219 lb 12.8 oz)  BMI  29.80 kg/m2  SpO2 91%   PPS: 20%   Intake/Output Summary (Last 24 hours) at 03/29/14 1010 Last data filed at 03/29/14 0600  Gross per 24 hour  Intake    700 ml  Output    175 ml  Net    525 ml   LBM: 11/22  Physical Exam:  General: Confused, ill appearing HEENT: Temporal muscle wasting, +JVD, moist mucous membranes Chest: Decreased throughout, bilateral rales, labored breathing CVS: RRR, S1 S2 Abdomen: Soft, NT, ND, +BS Ext: MAE, BLE 1+ edema, warm to touch Neuro: Awakens intermittently, oriented to person only  Labs: CBC    Component Value Date/Time   WBC 9.5 03/16/2014 1520   RBC 4.75 03/23/2014 1520   HGB 13.4  Apr 05, 2014 1520   HCT 39.1 04-05-14 1520   PLT 214 Apr 05, 2014 1520   MCV 82.3 04-05-2014 1520   MCH 28.2 04/05/2014 1520   MCHC 34.3 04/05/14 1520   RDW 19.8* 04-05-2014 1520   LYMPHSABS 1.4 04/05/14 1520   MONOABS 0.6 2014/04/05 1520   EOSABS 0.4 April 05, 2014 1520   BASOSABS 0.0 04/05/14 1520    BMET    Component Value Date/Time   NA 136* 03/29/2014 0543   K 5.0 03/29/2014 0543   CL 99 03/29/2014 0543   CO2 16* 03/29/2014 0543   GLUCOSE 132* 03/29/2014 0543   BUN 88* 03/29/2014 0543   CREATININE 3.20* 03/29/2014 0543   CREATININE 1.68* 04/23/2013 1221   CALCIUM 8.3* 03/29/2014 0543   GFRNONAA 16* 03/29/2014 0543   GFRAA 19* 03/29/2014 0543    CMP     Component Value Date/Time   NA 136* 03/29/2014 0543   K 5.0 03/29/2014 0543   CL 99 03/29/2014 0543   CO2 16* 03/29/2014 0543   GLUCOSE 132* 03/29/2014 0543   BUN 88* 03/29/2014 0543   CREATININE 3.20* 03/29/2014 0543   CREATININE 1.68* 04/23/2013 1221   CALCIUM 8.3* 03/29/2014 0543   PROT 6.4 03/19/2014 1302   ALBUMIN 2.6* 03/19/2014 1302   AST 27 03/19/2014 1302   ALT 28 03/19/2014 1302   ALKPHOS 89 03/19/2014 1302   BILITOT 1.4* 03/19/2014 1302   GFRNONAA 16* 03/29/2014 0543   GFRAA 19* 03/29/2014 0543     Time In Time Out Total Time Spent with Patient Total Overall  Time  0900 1010     Greater than 50%  of this time was spent counseling and coordinating care related to the above assessment and plan.  Yong Channel, NP Palliative Medicine Team Pager # [phone] (M-F 8a-5p) Team Phone # [phone] (Nights/Weekends)

## 2014-03-29 NOTE — Evaluation (Signed)
Clinical/Bedside Swallow Evaluation Patient Details  Name: Richard Chavez Coghill MRN: [id number] Date of Birth: Nov 30, 1926  Today's Date: 03/29/2014 Time: [id number] SLP Time Calculation (min) (ACUTE ONLY): 30 min  Past Medical History:  Past Medical History  Diagnosis Date  . CAD (coronary artery disease), native coronary artery     Status post CABG  . Hypertension   . Hyperlipidemia   . GERD (gastroesophageal reflux disease)   . COPD (chronic obstructive pulmonary disease)   . CRF (chronic renal failure)   . Barrett's esophagus   . OP (osteoporosis)   . CAD (coronary artery disease) of artery bypass graft 07/04/2011    LIMA-LAD; SVG-OM2-OM3, SVG-RCA -- all patent as of 05/27/11  . Syncope 07/04/2011  . Pacemaker 07/04/2011  . Cardiomyopathy, ischemic 07/2011; 09/2012    2D Echo - EF 35-40, mod dilated LA; Relook Echo 09/2012: EF 40-45%  . Myocardial infarction     25 yrs ago  . Peripheral vascular disease   . Arthritis   . BPH (benign prostatic hyperplasia)   . Polyp of colon     removed  . Sleep apnea     STOP BANG SCORE 4  . AV block, 3rd degree     Post St. Jude pacemaker, EF 35-40, does have intermittent atrial tachycardia, either PAt or Afib  . BPH (benign prostatic hypertrophy) 11/15/2011  . Paroxysmal atrial fibrillation - now rate controlled (formally with [facility] and Hospital) 10/03/2012  . CVA (cerebral infarction) 10/09/2012    Noted to be in atrial fibrillation with evaluation of pacemaker, prior to CVA. Right-sided MCA stroke with left arm weakness/mild paralysis -- notably improved   . Pneumonia 02/02/2014  . Shortness of breath   . Stroke     left hand weakness   Past Surgical History:  Past Surgical History  Procedure Laterality Date  . Pacemaker placement  2007  . Cholecystectomy  2005  . Coronary artery bypass graft  1978    X3  . Lesion excision      from penis  . Cardiac catheterization  06/06/2011    occluded proximal RCA, ostial LAD and mid Circumflex after OM 1.;  [place] LIMA-LAD, patent SVG-OM1-OM 2, patent SVG-RCA. Reduced ejection fraction of 30-35%.  . Cardiac catheterization  06/14/2010    LIMA to LAD, SVG to RCA, SVG to OM1 Circumflex, 100% occluded RCA, 100% occluded circumfkex, 100% occluded LAD, no evidence of graft dysfunction, continue medical therapy  . Cardiac catheterization  04/12/2003    3 vessel coronary artery disease, continue medical treatment  . Cardiac catheterization  10/07/2000    Severe 3 vessel coronary artery disease, continue medical treatment  . Doppler echocardiography  10/02/2012    EF 40-45%; possible grade 1 diastolic dysfunction, but A. fib precludes this. Mild atrial dilatation bilaterally. Moderately elevated pulmonary pressures of 40 mmHg.  . Prostate surgery     HPI:  Richard Chavez is a 80 y.o. male with a Past Medical History of ischemic cardiomyopathy with chronic systolic heart failure, COPD on chronic prednisone, history of CAD status post CABG, with numerous recurrent admissions in the past 1-2 months for pneumonia/CHF who presents today with complaints of worsening confusion,weakness, and shortness of breath. Patient was just [facility] on [facility]/14 after being treated for possible pneumonia and acute on chronic systolic heart failure. [name] Apparently since 11/18, patient has had slow deterioration in his mental status, he has become more confused and significantly more  weak. He has stopped ambulating for the past few days. Earlier today, he was found to be in respiratory distress. Per ED note, EMS was unable to place a CPAP mask due to lack of dentition and along facial structure, he was then placed on 100% nonrebreather mask. Further blood work showed a creatinine of 3.09, chest x-ray showed of bilateral infiltrates-either pneumonia or pulmonary edema.   Assessment / Plan / Recommendation Clinical Impression  Pt is acutely unable to swallow at  this time. During assessment he was unable to maintain oxygen saturation for more than one minute without venturi mask and appeared to intermittently struggle to maintain airway patency with mask on. SLP was able to remove upper partial denture and initiate oral care, but due to rapidly dropping oxygen saturation was unable to complete. Total assist was provided for head support and SLP utilized maximum therapeutic techniques in attempt to help pt establish and maintain arousal. However, pt was unable to maintain alertness and was not able to participate in PO trials. Recommend that he remain NPO pending improvement in level of alertness. SLP to reassess as appropriate.     Aspiration Risk  Severe    Diet Recommendation NPO        Other  Recommendations Oral Care Recommendations: Oral care Q4 per protocol   Follow Up Recommendations  Skilled Nursing facility    Frequency and Duration min 2x/week  2 weeks   Pertinent Vitals/Pain none    SLP Swallow Goals     Swallow Study Prior Functional Status       General Date of Onset: 03/13/2014 HPI: Richard Chavez is a 80 y.o. male with a Past Medical History of ischemic cardiomyopathy with chronic systolic heart failure, COPD on chronic prednisone, history of CAD status post CABG, with numerous recurrent admissions in the past 1-2 months for pneumonia/CHF who presents today with complaints of worsening confusion,weakness, and shortness of breath. Patient was just discharged from [facility] on 03/22/14 after being treated for possible pneumonia and acute on chronic systolic heart failure. [name] Apparently since 11/18, patient has had slow deterioration in his mental status, he has become more confused and significantly more weak. He has stopped ambulating for the past few days. Earlier today, he was found to be in respiratory distress. Per ED note, EMS was unable to place a CPAP mask due to lack  of dentition and along facial structure, he was then placed on 100% nonrebreather mask. Further blood work showed a creatinine of 3.09, chest x-ray showed of bilateral infiltrates-either pneumonia or pulmonary edema. Type of Study: Bedside swallow evaluation Previous Swallow Assessment: FEES 01/28/14 - rec regular diet with thin liquids, no straw Diet Prior to this Study: NPO Temperature Spikes Noted: No Respiratory Status: venti-mask History of Recent Intubation: No Behavior/Cognition: Lethargic;Doesn't follow directions;Requires cueing;Decreased sustained attention Oral Cavity - Dentition: Poor condition (upper partial) Self-Feeding Abilities: Total assist Patient Positioning: Upright in bed Baseline Vocal Quality: Other (comment) (unable to elicit voice) Volitional Cough: Weak Volitional Swallow: Unable to elicit    Oral/Motor/Sensory Function Overall Oral Motor/Sensory Function: Other (comment) (unable to assess)   Ice Chips Ice chips: Impaired Presentation: Spoon Oral Phase Impairments: Reduced labial seal;Poor awareness of bolus   Thin Liquid      Nectar Thick     Honey Thick     Puree     Solid   GO  Barkley Bruns 03/29/2014,2:41 PM

## 2014-03-29 NOTE — Progress Notes (Signed)
ANTICOAGULATION CONSULT NOTE - Follow Up Consult  Pharmacy Consult for Heparin Indication: atrial fibrillation  No Known Allergies  Patient Measurements: Height: 6' (182.9 cm) Weight: 219 lb 12.8 oz (99.7 kg) IBW/kg (Calculated) : 77.6 Heparin Dosing Weight: 102 kg  Vital Signs: Temp: 98.3 F (36.8 C) (11/23 2100) Temp Source: Oral (11/23 2100) BP: 109/33 mmHg (11/23 2100) Pulse Rate: 93 (11/23 2100)  Labs:  Recent Labs  03/26/2014 1520 03/29/14 0543 03/29/14 0905 03/29/14 2034  HGB 13.4  --   --   --   HCT 39.1  --   --   --   PLT 214  --   --   --   APTT  --  175* >200* 132*  HEPARINUNFRC  --  >2.20* >2.20*  --   CREATININE 3.09* 3.20*  --   --     Estimated Creatinine Clearance: 20.6 mL/min (by C-G formula based on Cr of 3.2).  Assessment: 80yo male presents to ED with AMS and weakness x 2 days.Patient is on apixiban at home for afib and now on heparin (patient with ARF and also noted not able to swallow). With recent apixiban heparin monitoring with aPTTs and aPTT is now 132 after infusion hold and decrease to 1100 units/hr.  Goal of Therapy:  Heparin level 0.3-0.7 units/ml  APTT 66-102 Monitor platelets by anticoagulation protocol: Yes  Plan:  -Decrease heparin to 900 units/hr -aPTT in 8hrs -Daily heparin level, aPTT and CBC  Harland German, Pharm D 03/29/2014 9:47 PM    -

## 2014-03-29 NOTE — Progress Notes (Signed)
Nutrition Brief Note  Pt identified on the Malnutrition Screening Tool Report. Pt with very poor prognosis; likely transition to full comfort care in next 24-48 hours. No nutrition interventions warranted at this time.  Please consult as needed.   Maureen Chatters, RD, LDN Pager #: [phone] After-Hours Pager #: [phone]

## 2014-03-29 NOTE — Plan of Care (Signed)
Problem: Phase I Progression Outcomes Goal: Confirm chest x-ray completed Outcome: Completed/Met Date Met:  03/29/14 Goal: Hemodynamically stable Outcome: Completed/Met Date Met:  03/29/14

## 2014-03-29 NOTE — Progress Notes (Signed)
ANTICOAGULATION CONSULT NOTE - Follow Up Consult  Pharmacy Consult for Heparin Indication: atrial fibrillation  No Known Allergies  Patient Measurements: Height: 6' (182.9 cm) Weight: 219 lb 12.8 oz (99.7 kg) IBW/kg (Calculated) : 77.6 Heparin Dosing Weight: 102 kg  Vital Signs: Temp: 96.9 F (36.1 C) (11/23 1239) Temp Source: Axillary (11/23 1239) BP: 107/37 mmHg (11/23 1239) Pulse Rate: 75 (11/23 0500)  Labs:  Recent Labs  03/09/2014 1520 03/29/14 0543 03/29/14 0905  HGB 13.4  --   --   HCT 39.1  --   --   PLT 214  --   --   APTT  --  175* >200*  HEPARINUNFRC  --  >2.20* >2.20*  CREATININE 3.09* 3.20*  --     Estimated Creatinine Clearance: 20.6 mL/min (by C-G formula based on Cr of 3.2).  Assessment: 80yo male presents to ED with AMS and weakness x 2 days. Pharmacy is consulted to dose vancomycin for possible pneumonia.  Anticoagulation: Eliquis PTA (last dose 11/22 AM). Now on IV heparin. Patient has bruises and oozing skin tears up and down arms.  Infectious Disease: HCAP with aspiration. Extensive Infiltrates. LA 2.82. Procalcitonin 1.06, Temp 96.9. WBC 9.5 Vanco 11/22>> Cefepime 11/22>>  Cardiovascular: CAD, CHF decompensation, proBNP 16,862, 107/37, HR 75. Meds: po Amio  Endocrinology: Glucose 132 on BMET  Gastrointestinal / Nutrition: NPO  Neurology: Acute encephalopathy with anxiety/agitation  Nephrology: Scr 3.2 up. K=5.  Pulmonary: COPD on chronic prednisone>>solumedrol, Xopenex, Atrovent  Hematology / Oncology  PTA Medication Issues  Best Practices: Mouth care, heparin  Heparin level 0.3-0.7 units/ml, aPTT 66-102 Monitor platelets by anticoagulation protocol: Yes   Plan:  Hold heparin x 1 hrs (started 1245) Decrease IV heparin to 1100 units/hr (11 units/kg/hr) and recheck level after 6 hrs.   Aniela Caniglia S. Merilynn Finland, PharmD, BCPS Clinical Staff Pharmacist Pager [phone]  Misty Stanley Stillinger 03/29/2014,12:59 PM

## 2014-03-29 NOTE — Progress Notes (Signed)
Full note to follow:  Richard Chavez is very restless and agitated when I enter the room. His son, Richard Chavez, is at bedside. I discussed with Richard Chavez his poor prognosis and that his renal function is slightly worse and there is not much change on his CXR today. Richard Chavez understands that his father has a very poor prognosis but he wishes to continue antibiotics and medications to attempt any improvement - I will add low dose Ativan and Roxanol prn. He also agrees to add some medications for comfort to address agitation and shortness of breath. Richard Chavez also says that [name] has always had some problems with constipation and that he was incontinent at [facility]. Richard Chavez tells me that his father had a stroke last year and has needed more help but that he was "doing good for 10 months" until September of this year. Since September it has been constant infection and hospitalizations and was becoming difficult for Richard Chavez to care for him at him with most recently he was requiring a wheelchair to get around. Mr. Below is a very spiritual person, per his son, and his church pastor has been contacted for support - offered chaplain services but not desired at this time and Richard Chavez will let us know if he changes his mind. I will continue to follow and support.   Yong Channel, NP Palliative Medicine Team Pager # [phone] (M-F 8a-5p) Team Phone # [phone] (Nights/Weekends)

## 2014-03-29 NOTE — Progress Notes (Signed)
Utilization Review Completed.  

## 2014-03-29 NOTE — Progress Notes (Signed)
Dr. Jerral Ralph and Pharmacy notified regarding PTT greater than 200. Order to stop Heparin for now.

## 2014-03-29 NOTE — Progress Notes (Signed)
PATIENT DETAILS Name: Richard Chavez Age: 80 y.o. Sex: male Date of Birth: 11/15/1926 Admit Date: 2014/04/13 Admitting Physician Dewayne Shorter Levora Dredge, MD ZOX:[id number], MD  Subjective: Essentially unchanged. Son at bedside.  Assessment/Plan: Principal Problem:   Acute Hypoxic respiratory failure: Multifactorial, suspect secondary to HCAP with aspiration and acute CHF decompensation. Continue antibiotics, and Lasix (if BP permits). See H&P from yesterday, however DO NOT RESUSCITATE, with no plans to escalate care. Seen by palliative care today, will start as needed Ativan/morphine for comfort. Son at bedside, aware of poor overall prognosis.  Active Problems:   Sepsis: Secondary to HCAP. Continue IV antibiotics, await cultures. Borderline hypotensive yesterday, given low rate of IV fluids on admission, have stop IV fluids this morning.    HCAP (healthcare-associated pneumonia): Has extensive bilateral pulmonary infiltrates-given presentation with acute encephalopathy, elevated lactate and pro-calcitonin-suspect some amount of this is from aspiration pneumonia/HCAP. Continue empiric vancomycin and cefepime. If able to follow commands-swallow evaluation.    Acute decompensated systolic heart failure: Chest x-ray shows bilateral pulmonary infiltrates, BNP significantly elevated, suspect that some amount of respiratory failure is from decompensated systolic heart failure. Given low rate of IV fluids yesterday, will stop IV fluids today and start Lasix-his blood pressure permits.    Acute encephalopathy: Essentially unchanged, likely secondary to above.    Acute renal failure: Unfortunately only 175 mL of urine output last night, will try 1 dose of 80 mg of Lasix. Not sure whether this is from sepsis or development of cardiorenal syndrome. Not a candidate for dialysis. If no further improvement, will need to transition to comfort care.   Paroxysmal atrial fibrillation: On  amiodarone-will continue if able to swallow.Eliquis on hold, continue heparin infusion. Remains in sinus rhythm.   COPD (chronic obstructive pulmonary disease): On chronic prednisone, suspect would not be able to take oral medications at this time, started on IV Solu-Medrol 40 mg twice a day and scheduled bronchodilators. No wheezing, mostly rales heard on exam.    CAD CABG 1998, patent grafts 06/06/11 after abn Myoview: Respiratory failure from either pneumonia or decompensated CHF. Initial point-of-care troponin was negative-given poor overall prognosis-further enzymes were not cycled.  Palliative care: See H&P. But in short-DO NOT RESUSCITATE, no further escalation and care. If no improvement in the next 2448 hrs., will need transition to comfort care. Palliative care consulted.  Disposition: Remain inpatient  Antibiotics: See below. Anti-infectives    Start     Dose/Rate Route Frequency Ordered Stop   03/29/14 1800  ceFEPIme (MAXIPIME) 1 g in dextrose 5 % 50 mL IVPB     1 g100 mL/hr over 30 Minutes Intravenous Every 24 hours 2014-04-13 1859 04/05/14 1759   04-13-2014 1700  vancomycin (VANCOCIN) 1,500 mg in sodium chloride 0.9 % 500 mL IVPB     1,500 mg250 mL/hr over 120 Minutes Intravenous Every 48 hours 2014/04/13 1648     04/13/2014 1600  ceFEPIme (MAXIPIME) 1 g in dextrose 5 % 50 mL IVPB     1 g100 mL/hr over 30 Minutes Intravenous  Once 2014-04-13 1550 2014-04-13 1707     DVT Prophylaxis: IV Heparin gtt  Code Status:  DNR  Family Communication Son-[name] at home  Procedures:  None  CONSULTS:  Palliative Care  Time spent 40 minutes-which includes 50% of the time with face-to-face with patient/ family and coordinating care related to the above assessment and plan.  MEDICATIONS: Scheduled Meds: . amiodarone  400 mg Oral Daily  .  antiseptic oral rinse  7 mL Mouth Rinse q12n4p  . ceFEPime (MAXIPIME) IV  1 g Intravenous Q24H  . chlorhexidine  15 mL Mouth Rinse BID  . ipratropium   0.5 mg Nebulization TID  . levalbuterol  0.63 mg Nebulization TID  . methylPREDNISolone (SOLU-MEDROL) injection  40 mg Intravenous Q12H  . vancomycin  1,500 mg Intravenous Q48H   Continuous Infusions: . heparin 1,400 Units/hr (08/30/2013 2158)   PRN Meds:.acetaminophen, bisacodyl, levalbuterol, LORazepam, morphine CONCENTRATE, ondansetron (ZOFRAN) IV    PHYSICAL EXAM: Vital signs in last 24 hours: Filed Vitals:   03/29/14 0343 03/29/14 0500 03/29/14 0817 03/29/14 0826  BP: 125/54 115/48 109/35   Pulse: 77 75    Temp: 98.4 F (36.9 C)  97.5 F (36.4 C)   TempSrc: Axillary  Axillary   Resp: 23 22    Height: 6' (1.829 m)     Weight: 99.7 kg (219 lb 12.8 oz)     SpO2: 92% 93%  91%    Weight change:  Filed Weights   08/30/2013 1602 03/29/14 0343  Weight: 102.513 kg (226 lb) 99.7 kg (219 lb 12.8 oz)   Body mass index is 29.8 kg/(m^2).   Gen Exam: Awake and confused. Remains in moderate amount of distress Neck: Supple, No JVD.   Chest: B/Lrales all over CVS: S1 S2 Regular Abdomen: soft, BS +, non tender, non distended.  Extremities: 1+ edema, lower extremities warm to touch. Neurologic: Non Focal-but poor exam-diffusely weak Skin: No Rash.   Wounds: N/A.    Intake/Output from previous day:  Intake/Output Summary (Last 24 hours) at 03/29/14 1139 Last data filed at 03/29/14 0600  Gross per 24 hour  Intake    700 ml  Output    175 ml  Net    525 ml     LAB RESULTS: CBC  Recent Labs Lab 08/30/2013 1520  WBC 9.5  HGB 13.4  HCT 39.1  PLT 214  MCV 82.3  MCH 28.2  MCHC 34.3  RDW 19.8*  LYMPHSABS 1.4  MONOABS 0.6  EOSABS 0.4  BASOSABS 0.0    Chemistries   Recent Labs Lab 08/30/2013 1520 03/29/14 0543  NA 132* 136*  K 4.7 5.0  CL 93* 99  CO2 19 16*  GLUCOSE 89 132*  BUN 77* 88*  CREATININE 3.09* 3.20*  CALCIUM 9.0 8.3*    CBG:  Recent Labs Lab 08/30/2013 1436  GLUCAP 96    GFR Estimated Creatinine Clearance: 20.6 mL/min (by C-G formula based  on Cr of 3.2).  Coagulation profile No results for input(s): INR, PROTIME in the last 168 hours.  Cardiac Enzymes No results for input(s): CKMB, TROPONINI, MYOGLOBIN in the last 168 hours.  Invalid input(s): CK  Invalid input(s): POCBNP No results for input(s): DDIMER in the last 72 hours. No results for input(s): HGBA1C in the last 72 hours. No results for input(s): CHOL, HDL, LDLCALC, TRIG, CHOLHDL, LDLDIRECT in the last 72 hours. No results for input(s): TSH, T4TOTAL, T3FREE, THYROIDAB in the last 72 hours.  Invalid input(s): FREET3 No results for input(s): VITAMINB12, FOLATE, FERRITIN, TIBC, IRON, RETICCTPCT in the last 72 hours. No results for input(s): LIPASE, AMYLASE in the last 72 hours.  Urine Studies No results for input(s): UHGB, CRYS in the last 72 hours.  Invalid input(s): UACOL, UAPR, USPG, UPH, UTP, UGL, UKET, UBIL, UNIT, UROB, ULEU, UEPI, UWBC, URBC, UBAC, CAST, UCOM, BILUA  MICROBIOLOGY: Recent Results (from the past 240 hour(s))  Blood Culture (routine x 2)  Status: None   Collection Time: 03/19/14 12:33 PM  Result Value Ref Range Status   Specimen Description BLOOD RIGHT ANTECUBITAL  Final   Special Requests BOTTLES DRAWN AEROBIC AND ANAEROBIC  Final   Culture  Setup Time   Final    03/19/2014 16:33 Performed at [facility]    Culture   Final    NO GROWTH 5 DAYS Performed at [facility]    Report Status 03/25/2014 FINAL  Final  Urine culture     Status: None   Collection Time: 03/19/14 12:49 PM  Result Value Ref Range Status   Specimen Description URINE, CATHETERIZED  Final   Special Requests NONE  Final   Culture  Setup Time   Final    03/19/2014 13:54 Performed at [facility]   Final    50,000 COLONIES/ML Performed at [facility]    Culture YEAST Performed at [facility]   Final   Report Status 03/20/2014 FINAL  Final  Blood Culture (routine x 2)     Status: None    Collection Time: 03/19/14  1:03 PM  Result Value Ref Range Status   Specimen Description BLOOD RIGHT ARM  Final   Special Requests BOTTLES DRAWN AEROBIC AND ANAEROBIC  Final   Culture  Setup Time   Final    03/19/2014 16:33 Performed at [facility]    Culture   Final    NO GROWTH 5 DAYS Performed at [facility]    Report Status 03/25/2014 FINAL  Final  Blood culture (routine x 2)     Status: None (Preliminary result)   Collection Time: 03/14/2014  3:26 PM  Result Value Ref Range Status   Specimen Description BLOOD RIGHT ARM  Final   Special Requests BOTTLES DRAWN AEROBIC AND ANAEROBIC 5CC  Final   Culture  Setup Time   Final    03/22/2014 21:35 Performed at [facility]    Culture   Final           BLOOD CULTURE RECEIVED NO GROWTH TO DATE CULTURE WILL BE HELD FOR 5 DAYS BEFORE ISSUING A FINAL NEGATIVE REPORT Performed at [facility]    Report Status PENDING  Incomplete  Blood culture (routine x 2)     Status: None (Preliminary result)   Collection Time: 03/25/2014  3:36 PM  Result Value Ref Range Status   Specimen Description BLOOD LEFT ARM  Final   Special Requests BOTTLES DRAWN AEROBIC ONLY 8CC  Final   Culture  Setup Time   Final    03/10/2014 21:35 Performed at [facility]    Culture   Final           BLOOD CULTURE RECEIVED NO GROWTH TO DATE CULTURE WILL BE HELD FOR 5 DAYS BEFORE ISSUING A FINAL NEGATIVE REPORT Performed at [facility]    Report Status PENDING  Incomplete  MRSA PCR Screening     Status: None   Collection Time: 03/31/2014  7:02 PM  Result Value Ref Range Status   MRSA by PCR NEGATIVE NEGATIVE Final    Comment:        The GeneXpert MRSA Assay (FDA approved for NASAL specimens only), is one component of a comprehensive MRSA colonization surveillance program. It is not intended to diagnose MRSA infection nor to guide or monitor treatment for MRSA infections.     RADIOLOGY STUDIES/RESULTS: Dg  Chest [facility]  03/29/2014   CLINICAL DATA:  Shortness breath.  EXAM: PORTABLE CHEST - 1 VIEW  COMPARISON:  03/14/2014, 02/20/2014, 02/04/2014.  FINDINGS: Mediastinum and hilar structures are unremarkable. Cardiac pacer with lead tips in right atrium and right ventricle. Prior CABG. Cardiomegaly. Unchanged prominent bilateral pulmonary infiltrates are present. No significant pleural effusion. No pneumothorax.  IMPRESSION: 1. Persistent severe bilateral pulmonary infiltrates, unchanged. 2. Prior CABG.  Cardiomegaly.  Cardiac pacer in stable position.   Electronically Signed   By: Maisie Fus  Register   On: 03/29/2014 08:07   Dg Chest Portable 1 View  04/02/2014   CLINICAL DATA:  Per ED note: Pt arrived by [facility] from [facility]. Pt was just dc from [facility] on Monday for pneumonia. Called ems for hypotension and resp distress. Wheezing/crackles noted by ems, given neb tx pta, diminished lower lung sounds, altered mental status, did not tolerate cpap pta.  EXAM: PORTABLE CHEST - 1 VIEW  COMPARISON:  03/19/2014  FINDINGS: Right-sided pacemaker and sternotomy wires are unchanged. Lungs are adequately inflated with diffuse bilateral airspace opacification which is worse and likely represents infection although cannot exclude a component of interstitial edema. No definite effusion. Moderate stable cardiomegaly. Remainder of the exam is unchanged.  IMPRESSION: Worsening diffuse bilateral airspace process likely infection, although cannot exclude a component interstitial edema.  Stable cardiomegaly.   Electronically Signed   By: Elberta Fortis M.D.   On: 03/14/2014 15:01   Dg Chest Port 1 View  03/19/2014   CLINICAL DATA:  Weakness, hypotensive  EXAM: PORTABLE CHEST - 1 VIEW  COMPARISON:  None.  FINDINGS: Chronic interstitial/airspace opacities, suspicious for chronic interstitial lung disease. Superimposed pneumonia is difficult to exclude. No pleural effusion or pneumothorax.  Cardiomegaly. Postsurgical changes  related to prior CABG. Right subclavian pacemaker.  IMPRESSION: Suspected chronic interstitial lung disease. Superimposed pneumonia is difficult to exclude.   Electronically Signed   By: Charline Bills M.D.   On: 03/19/2014 13:05    Jeoffrey Massed, MD  [facility] Pager:[phone]  If 7PM-7AM, please contact night-coverage [email or web address] Password [email or web address] 03/29/2014, 11:39 AM   LOS: 1 day

## 2014-03-29 NOTE — Progress Notes (Signed)
Medicated for restlessness and labored breathing  X 3 on 7a-7p shift.

## 2014-03-30 ENCOUNTER — Inpatient Hospital Stay (HOSPITAL_COMMUNITY): Payer: Medicare Other

## 2014-03-30 LAB — BASIC METABOLIC PANEL
ANION GAP: 17 — AB (ref 5–15)
BUN: 109 mg/dL — ABNORMAL HIGH (ref 6–23)
CALCIUM: 8.3 mg/dL — AB (ref 8.4–10.5)
CHLORIDE: 104 meq/L (ref 96–112)
CO2: 20 mEq/L (ref 19–32)
CREATININE: 2.68 mg/dL — AB (ref 0.50–1.35)
GFR calc Af Amer: 23 mL/min — ABNORMAL LOW (ref >90)
GFR calc non Af Amer: 20 mL/min — ABNORMAL LOW (ref >90)
Glucose, Bld: 163 mg/dL — ABNORMAL HIGH (ref 70–99)
Potassium: 4.5 mEq/L (ref 3.7–5.3)
SODIUM: 141 meq/L (ref 137–147)

## 2014-03-30 LAB — APTT
APTT: 64 s — AB (ref 24–37)
aPTT: 108 seconds — ABNORMAL HIGH (ref 24–37)

## 2014-03-30 LAB — HEPARIN LEVEL (UNFRACTIONATED)

## 2014-03-30 LAB — PROCALCITONIN: Procalcitonin: 0.66 ng/mL

## 2014-03-30 MED ORDER — FUROSEMIDE 10 MG/ML IJ SOLN
80.0000 mg | Freq: Three times a day (TID) | INTRAMUSCULAR | Status: DC
  Administered 2014-03-30 – 2014-03-31 (×4): 80 mg via INTRAVENOUS
  Filled 2014-03-30 (×7): qty 8

## 2014-03-30 NOTE — Progress Notes (Signed)
Speech Language Pathology Treatment: Dysphagia  Patient Details Name: Richard Chavez MRN: [id number] DOB: 1926-05-10 Today's Date: 03/30/2014 Time: 6811-5726 SLP Time Calculation (min) (ACUTE ONLY): 13 min  Assessment / Plan / Recommendation Clinical Impression  SLP provided repositioning for increased arousal, pt alert and requesting ice. SLP provided oral care to facilitate lingual sensation and mobility (severely dry) with desat on nonrebreather down to 88 with only momentary removal. One ice chip provided, pt briefly manipulated and said "That feels so good." No swallow response elicited as pt unable to sustain mouth closed for swallow due to respiratory need; moisture removed from mouth with suction. Overall pt demosntrates improved arousal and strength for head support and oral manipulation, but respiratory status impedes any safe intake of PO. Recommend consideration of ice chips with known risk for comfort and more frequent oral care as pts oral cavity is severely dry from oxygen therapy. SLP will sign off as Palliative care team is involved and can aid in decisions for comfort feeding. Please reorder if family education needed or if pt status changes.    HPI HPI: Richard Chavez is a 80 y.o. male with a Past Medical History of ischemic cardiomyopathy with chronic systolic heart failure, COPD on chronic prednisone, history of CAD status post CABG, with numerous recurrent admissions in the past 1-2 months for pneumonia/CHF who presents today with complaints of worsening confusion,weakness, and shortness of breath. Patient was just [facility] after being treated for possible pneumonia and acute on chronic systolic heart failure. [name] Apparently since 11/18, patient has had slow deterioration in his mental status, he has become more confused and significantly more weak. He has stopped ambulating for the past few days.  Earlier today, he was found to be in respiratory distress. Per ED note, EMS was unable to place a CPAP mask due to lack of dentition and along facial structure, he was then placed on 100% nonrebreather mask. Further blood work showed a creatinine of 3.09, chest x-ray showed of bilateral infiltrates-either pneumonia or pulmonary edema.   Pertinent Vitals    SLP Plan  Continue with current plan of care    Recommendations Diet recommendations: NPO;Other(comment) (condier comfort feeds) Medication Administration: Via alternative means              Oral Care Recommendations: Oral care Q4 per protocol Follow up Recommendations: Skilled Nursing facility Plan: Continue with current plan of care    GO    Harlon Ditty, MA CCC-SLP [phone]  Claudine Mouton 03/30/2014, 9:57 AM

## 2014-03-30 NOTE — Progress Notes (Signed)
[facility]  Subjective: I spoke to Richard Chavez's son, Onalee HuaDavid, again today at bedside. Onalee HuaDavid has spoken with Dr. Jerral RalphGhimire who has explained poor prognosis and recommended hospice facility. I explained what this means and a shift to full comfort with Onalee Huaavid. Onalee HuaDavid says that he has had other friends/family with hospice so he understands. However, he does state that he wants us to get his father "as strong as we can." We agree to one more day of treatment with the understanding that he may not get any better than he is now with a plan to [facility]. Onalee HuaDavid understands that once we are focusing on Richard Chavez's comfort that prognosis may be days to a couple weeks.     Objective: No Known Allergies Scheduled Meds: . amiodarone  400 mg Oral Daily  . antiseptic oral rinse  7 mL Mouth Rinse q12n4p  . ceFEPime (MAXIPIME) IV  1 g Intravenous Q24H  . chlorhexidine  15 mL Mouth Rinse BID  . furosemide  80 mg Intravenous 3 times per day  . ipratropium  0.5 mg Nebulization TID  . levalbuterol  0.63 mg Nebulization TID  . methylPREDNISolone (SOLU-MEDROL) injection  40 mg Intravenous Q12H  . vancomycin  1,500 mg Intravenous Q48H   Continuous Infusions: . heparin 750 Units/hr (03/30/14 0812)   PRN Meds:.acetaminophen, bisacodyl, levalbuterol, LORazepam, morphine injection, ondansetron (ZOFRAN) IV  BP 103/53 mmHg  Pulse 82  Temp(Src) 97.5 F (36.4 C) (Axillary)  Resp 20  Ht 6' (1.829 m)  Wt 99.7 kg (219 lb 12.8 oz)  BMI 29.80 kg/m2  SpO2 94%   PPS: 20%   Intake/Output Summary (Last 24 hours) at 03/30/14 1719 Last data filed at 03/30/14 1253  Gross per 24 hour  Intake 137.17 ml  Output   2000 ml  Net -1862.83 ml      LBM: 11/22  Physical Exam:  General: Confused, ill appearing HEENT: Temporal muscle wasting, +JVD, moist mucous membranes Chest: Decreased throughout, bilateral rales, labored breathing slightly  improved CVS: RRR, S1 S2 Abdomen: Soft, NT, ND, +BS Ext: MAE, BLE 1+ edema, warm to touch Neuro: Awakens intermittently, oriented to person only  Labs: CBC    Component Value Date/Time   WBC 9.5 01/10/2014 1520   RBC 4.75 01/10/2014 1520   HGB 13.4 01/10/2014 1520   HCT 39.1 01/10/2014 1520   PLT 214 01/10/2014 1520   MCV 82.3 01/10/2014 1520   MCH 28.2 01/10/2014 1520   MCHC 34.3 01/10/2014 1520   RDW 19.8* 01/10/2014 1520   LYMPHSABS 1.4 01/10/2014 1520   MONOABS 0.6 01/10/2014 1520   EOSABS 0.4 01/10/2014 1520   BASOSABS 0.0 01/10/2014 1520    BMET    Component Value Date/Time   NA 141 03/30/2014 0637   K 4.5 03/30/2014 0637   CL 104 03/30/2014 0637   CO2 20 03/30/2014 0637   GLUCOSE 163* 03/30/2014 0637   BUN 109* 03/30/2014 0637   CREATININE 2.68* 03/30/2014 0637   CREATININE 1.68* 04/23/2013 1221   CALCIUM 8.3* 03/30/2014 0637   GFRNONAA 20* 03/30/2014 0637   GFRAA 23* 03/30/2014 0637    CMP     Component Value Date/Time   NA 141 03/30/2014 0637   K 4.5 03/30/2014 0637   CL 104 03/30/2014 0637   CO2 20 03/30/2014 0637   GLUCOSE 163* 03/30/2014 0637   BUN 109* 03/30/2014 0637   CREATININE 2.68* 03/30/2014 0637   CREATININE  1.68* 04/23/2013 1221   CALCIUM 8.3* 03/30/2014 0637   PROT 6.4 03/19/2014 1302   ALBUMIN 2.6* 03/19/2014 1302   AST 27 03/19/2014 1302   ALT 28 03/19/2014 1302   ALKPHOS 89 03/19/2014 1302   BILITOT 1.4* 03/19/2014 1302   GFRNONAA 20* 03/30/2014 0637   GFRAA 23* 03/30/2014 0637     Assessment and Plan: 1. Code Status: DNR 2. Symptom Control: 1. Anxiety/Agitation: Lorazepam 0.5 mg every 4 hours prn.  2. Shortness of breath: Morphine 0.5-1 mg every 2 hours prn.  3. Constipation: Dulcolax supp daily prn.  3. Psycho/Social: Emotional support provided to patient and son at bedside.  4. Spiritual: Care and support from their personal church. Chaplain support offered.  5. Disposition: Hopeful for [employer].   Patient  Documents Completed or Given: Document Given Completed  Advanced Directives Pkt    MOST    DNR    Gone from My Sight    Hard Choices yes     Time In Time Out Total Time Spent with Patient Total Overall Time  1500 1520     Greater than 50%  of this time was spent counseling and coordinating care related to the above assessment and plan.   Yong Channel, NP Palliative Medicine Team Pager # [phone] (M-F 8a-5p) Team Phone # [phone] (Nights/Weekends)  1

## 2014-03-30 NOTE — Progress Notes (Addendum)
Long d/w son-[name] at bedside, although slightly improved, remains very hypoxic. Suspect that patient will continue to deteriorate given very poor overall condition. Suspect he will best benefit by comfort care, transition to residential hospice. Suspect given aspiration/CHF-any improvement will only be minimal and temporary, as high likelihood of deterioration in the near future. Son-understood, wanted to know more information about [employer]. I have asked palliative care to follow up again today.Family ok with comfort feeds-will begin Dys 1 diet with full supervision. All risks of aspiration explained, son-[name] accepting.

## 2014-03-30 NOTE — Progress Notes (Signed)
ANTICOAGULATION CONSULT NOTE - Follow Up Consult  Pharmacy Consult for heparin Indication: atrial fibrillation  Labs:  Recent Labs  03/19/2014 1520  03/29/14 0543 03/29/14 0905 03/29/14 2034 03/30/14 0637  HGB 13.4  --   --   --   --   --   HCT 39.1  --   --   --   --   --   PLT 214  --   --   --   --   --   APTT  --   < > 175* >200* 132* 108*  HEPARINUNFRC  --   --  >2.20* >2.20*  --   --   CREATININE 3.09*  --  3.20*  --   --  2.68*  < > = values in this interval not displayed.   Assessment: 80yo male remains slightly supratherapeutic on heparin despite several rate decreases.  Goal of Therapy:  Heparin level 66-102 units/ml   Plan:  Will decrease heparin gtt by 1-2 units/kg/hr to 750 units/hr and check PTT in 8hr.  Vernard Gambles, PharmD, BCPS  03/30/2014,7:34 AM

## 2014-03-30 NOTE — Progress Notes (Signed)
RN called because one IV site lost and heparin is infusing through the other one. Vancomycin and Heparin are not compatible, and IV team has tried and can not locate another PIV site. IV team suggested a PICC line. Given documentation on the chart that family may elect comfort care in the am, do not feel it necessary to put Mr. Richard Chavez through insertion of a PICC line at this time. This NP called Marya Fossa, who understands and agrees.  Jimmye Norman, NP [facility]

## 2014-03-30 NOTE — Progress Notes (Signed)
PATIENT DETAILS Name: Richard ShuttersFred M Chavez Age: 80 y.o. Sex: male Date of Birth: 09-22-1926 Admit Date: 03/22/2014 Admitting Physician Dewayne ShorterShanker Levora DredgeM Ghimire, MD ONG:[id number],GMWNUPCP:MACKENZIE,BRIAN, MD  Subjective: Essentially unchanged-still continues to be encephalopathic, and hypoxic-requiring 100% nonrebreather mask. More urine output today.  Assessment/Plan: Principal Problem:   Acute Hypoxic respiratory failure: Multifactorial, suspect secondary to HCAP with aspiration and acute CHF decompensation. Continue antibiotics, and Lasix. No family at bedside, I would await arrival of patient's son-Richard Chavez, and see if family is acceptable to transition to full comfort measures-as not much improvement in spite of 48 hours of antibiotics.. As noted in the H&P, patient has recurrent admissions of similar issues, and probably even if he survives this admission will continue to have similar issues in the near future. Family stress son were very poor overall prognosis. Seen by palliative care on 11/23, has been started on Ativan and morphine for comfort.  Active Problems:   Sepsis: Secondary to HCAP. Continue IV antibiotics. Blood cultures on 11/22 negative so far. See above-await arrival of family to see if they are acceptable to transition to comfort measures.    HCAP (healthcare-associated pneumonia): Has extensive bilateral pulmonary infiltrates-given presentation with acute encephalopathy, elevated lactate and pro-calcitonin-suspect some amount of this is from aspiration pneumonia/HCAP. Continue empiric vancomycin and cefepime. Seen by speech therapy on 11/23, unable to perform a formal evaluation given mental status and decreased hypoxia, continue to suspect that patient continues to have a high likelihood of ongoing aspiration.    Acute decompensated systolic heart failure: Chest x-ray shows bilateral pulmonary infiltrates, BNP significantly elevated, suspect that some amount of respiratory failure is from  decompensated systolic heart failure. Given low rate of IV fluids on admission, however has now been started on IV Lasix. Approximately 1400/59 mL of urine output in the last 24 hours, weight also decreased somewhat. Irrespective of improvement in urine output continues to be significantly hypoxic, continues to have very poor overall prognosis. Await arrival of family, and COPD would leave initiation of comfort care measures.     Acute encephalopathy: Essentially unchanged, likely secondary to above.    Acute renal failure: Likely secondary to acute decompensated CHF/sepsis. Urine output much improved with initiation of IV Lasix.   Paroxysmal atrial fibrillation: On amiodarone-will continue if able to swallow.Eliquis on hold, continue heparin infusion per pharmacy. Remains in sinus rhythm.   COPD (chronic obstructive pulmonary disease): On chronic prednisone, suspect would not be able to take oral medications at this time, started on IV Solu-Medrol 40 mg twice a day and scheduled bronchodilators. No wheezing, mostly rales heard on exam.    CAD CABG 1998, patent grafts 06/06/11 after abn Myoview: Respiratory failure from either pneumonia or decompensated CHF. Initial point-of-care troponin was negative-given poor overall prognosis-further enzymes were not cycled.  Palliative care: See H&P. But in short-DO NOT RESUSCITATE, no further escalation and care. We will await arrival of family, and see if family willing to initiate full comfort measures.   Disposition: Remain inpatient  Antibiotics: See below. Anti-infectives    Start     Dose/Rate Route Frequency Ordered Stop   03/29/14 2100  ceFEPIme (MAXIPIME) 1 g in dextrose 5 % 50 mL IVPB     1 g100 mL/hr over 30 Minutes Intravenous Every 24 hours 03/29/14 1853 04/04/14 2059   03/29/14 1800  ceFEPIme (MAXIPIME) 1 g in dextrose 5 % 50 mL IVPB  Status:  Discontinued     1 g100 mL/hr over 30 Minutes  Intravenous Every 24 hours 04/11/2014 1859 03/29/14  1853   04-11-2014 1700  vancomycin (VANCOCIN) 1,500 mg in sodium chloride 0.9 % 500 mL IVPB     1,500 mg250 mL/hr over 120 Minutes Intravenous Every 48 hours 04/11/14 1648     Apr 11, 2014 1600  ceFEPIme (MAXIPIME) 1 g in dextrose 5 % 50 mL IVPB     1 g100 mL/hr over 30 Minutes Intravenous  Once 2014/04/11 1550 2014/04/11 1707     DVT Prophylaxis: IV Heparin gtt  Code Status:  DNR  Family Communication Son-Richard Chavez at home  Procedures:  None  CONSULTS:  Palliative Care  Time spent 40 minutes-which includes 50% of the time with face-to-face with patient/ family and coordinating care related to the above assessment and plan.  MEDICATIONS: Scheduled Meds: . amiodarone  400 mg Oral Daily  . antiseptic oral rinse  7 mL Mouth Rinse q12n4p  . ceFEPime (MAXIPIME) IV  1 g Intravenous Q24H  . chlorhexidine  15 mL Mouth Rinse BID  . furosemide  80 mg Intravenous 3 times per day  . ipratropium  0.5 mg Nebulization TID  . levalbuterol  0.63 mg Nebulization TID  . methylPREDNISolone (SOLU-MEDROL) injection  40 mg Intravenous Q12H  . vancomycin  1,500 mg Intravenous Q48H   Continuous Infusions: . heparin 750 Units/hr (03/30/14 0812)   PRN Meds:.acetaminophen, bisacodyl, levalbuterol, LORazepam, morphine injection, ondansetron (ZOFRAN) IV    PHYSICAL EXAM: Vital signs in last 24 hours: Filed Vitals:   03/30/14 0430 03/30/14 0814 03/30/14 0820 03/30/14 0833  BP:  108/48    Pulse:  76    Temp: 97.5 F (36.4 C) 97.3 F (36.3 C)    TempSrc: Axillary Axillary    Resp:  28    Height:      Weight:      SpO2:  93% 92% 94%    Weight change:  Filed Weights   April 11, 2014 1602 03/29/14 0343  Weight: 102.513 kg (226 lb) 99.7 kg (219 lb 12.8 oz)   Body mass index is 29.8 kg/(m^2).   Gen Exam: Awake and confused. Remains in moderate amount of distress Neck: Supple, No JVD.   Chest: B/Lrales all over CVS: S1 S2 Regular Abdomen: soft, BS +, non tender, non distended.  Extremities: 1+ edema,  lower extremities warm to touch. Neurologic: Non Focal-but poor exam-diffusely weak Skin: No Rash.   Wounds: N/A.    Intake/Output from previous day:  Intake/Output Summary (Last 24 hours) at 03/30/14 1002 Last data filed at 03/30/14 0814  Gross per 24 hour  Intake 137.17 ml  Output   1725 ml  Net -1587.83 ml     LAB RESULTS: CBC  Recent Labs Lab 04/11/14 1520  WBC 9.5  HGB 13.4  HCT 39.1  PLT 214  MCV 82.3  MCH 28.2  MCHC 34.3  RDW 19.8*  LYMPHSABS 1.4  MONOABS 0.6  EOSABS 0.4  BASOSABS 0.0    Chemistries   Recent Labs Lab April 11, 2014 1520 03/29/14 0543 03/30/14 0637  NA 132* 136* 141  K 4.7 5.0 4.5  CL 93* 99 104  CO2 19 16* 20  GLUCOSE 89 132* 163*  BUN 77* 88* 109*  CREATININE 3.09* 3.20* 2.68*  CALCIUM 9.0 8.3* 8.3*    CBG:  Recent Labs Lab 2014-04-11 1436  GLUCAP 96    GFR Estimated Creatinine Clearance: 24.6 mL/min (by C-G formula based on Cr of 2.68).  Coagulation profile No results for input(s): INR, PROTIME in the last 168 hours.  Cardiac Enzymes  No results for input(s): CKMB, TROPONINI, MYOGLOBIN in the last 168 hours.  Invalid input(s): CK  Invalid input(s): POCBNP No results for input(s): DDIMER in the last 72 hours. No results for input(s): HGBA1C in the last 72 hours. No results for input(s): CHOL, HDL, LDLCALC, TRIG, CHOLHDL, LDLDIRECT in the last 72 hours. No results for input(s): TSH, T4TOTAL, T3FREE, THYROIDAB in the last 72 hours.  Invalid input(s): FREET3 No results for input(s): VITAMINB12, FOLATE, FERRITIN, TIBC, IRON, RETICCTPCT in the last 72 hours. No results for input(s): LIPASE, AMYLASE in the last 72 hours.  Urine Studies No results for input(s): UHGB, CRYS in the last 72 hours.  Invalid input(s): UACOL, UAPR, USPG, UPH, UTP, UGL, UKET, UBIL, UNIT, UROB, ULEU, UEPI, UWBC, URBC, UBAC, CAST, UCOM, BILUA  MICROBIOLOGY: Recent Results (from the past 240 hour(s))  Blood culture (routine x 2)     Status: None  (Preliminary result)   Collection Time: 04-13-2014  3:26 PM  Result Value Ref Range Status   Specimen Description BLOOD RIGHT ARM  Final   Special Requests BOTTLES DRAWN AEROBIC AND ANAEROBIC 5CC  Final   Culture  Setup Time   Final    2014/04/13 21:35 Performed at [facility]    Culture   Final           BLOOD CULTURE RECEIVED NO GROWTH TO DATE CULTURE WILL BE HELD FOR 5 DAYS BEFORE ISSUING A FINAL NEGATIVE REPORT Performed at [facility]    Report Status PENDING  Incomplete  Blood culture (routine x 2)     Status: None (Preliminary result)   Collection Time: 04-13-14  3:36 PM  Result Value Ref Range Status   Specimen Description BLOOD LEFT ARM  Final   Special Requests BOTTLES DRAWN AEROBIC ONLY 8CC  Final   Culture  Setup Time   Final    04/13/2014 21:35 Performed at [facility]    Culture   Final           BLOOD CULTURE RECEIVED NO GROWTH TO DATE CULTURE WILL BE HELD FOR 5 DAYS BEFORE ISSUING A FINAL NEGATIVE REPORT Performed at [facility]    Report Status PENDING  Incomplete  MRSA PCR Screening     Status: None   Collection Time: 13-Apr-2014  7:02 PM  Result Value Ref Range Status   MRSA by PCR NEGATIVE NEGATIVE Final    Comment:        The GeneXpert MRSA Assay (FDA approved for NASAL specimens only), is one component of a comprehensive MRSA colonization surveillance program. It is not intended to diagnose MRSA infection nor to guide or monitor treatment for MRSA infections.     RADIOLOGY STUDIES/RESULTS: Dg Chest Port 1 View  03/29/2014   CLINICAL DATA:  Shortness breath.  EXAM: PORTABLE CHEST - 1 VIEW  COMPARISON:  April 13, 2014, 02/20/2014, 02/04/2014.  FINDINGS: Mediastinum and hilar structures are unremarkable. Cardiac pacer with lead tips in right atrium and right ventricle. Prior CABG. Cardiomegaly. Unchanged prominent bilateral pulmonary infiltrates are present. No significant pleural effusion. No pneumothorax.  IMPRESSION: 1.  Persistent severe bilateral pulmonary infiltrates, unchanged. 2. Prior CABG.  Cardiomegaly.  Cardiac pacer in stable position.   Electronically Signed   By: Maisie Fus  Register   On: 03/29/2014 08:07   Dg Chest Portable 1 View  2014-04-13   CLINICAL DATA:  Per ED note: Pt arrived by [facility] from [facility]. Pt was just dc from [facility] on Monday for pneumonia. Called ems for  hypotension and resp distress. Wheezing/crackles noted by ems, given neb tx pta, diminished lower lung sounds, altered mental status, did not tolerate cpap pta.  EXAM: PORTABLE CHEST - 1 VIEW  COMPARISON:  03/19/2014  FINDINGS: Right-sided pacemaker and sternotomy wires are unchanged. Lungs are adequately inflated with diffuse bilateral airspace opacification which is worse and likely represents infection although cannot exclude a component of interstitial edema. No definite effusion. Moderate stable cardiomegaly. Remainder of the exam is unchanged.  IMPRESSION: Worsening diffuse bilateral airspace process likely infection, although cannot exclude a component interstitial edema.  Stable cardiomegaly.   Electronically Signed   By: Elberta Fortis M.D.   On: 03-30-14 15:01   Dg Chest Port 1 View  03/19/2014   CLINICAL DATA:  Weakness, hypotensive  EXAM: PORTABLE CHEST - 1 VIEW  COMPARISON:  None.  FINDINGS: Chronic interstitial/airspace opacities, suspicious for chronic interstitial lung disease. Superimposed pneumonia is difficult to exclude. No pleural effusion or pneumothorax.  Cardiomegaly. Postsurgical changes related to prior CABG. Right subclavian pacemaker.  IMPRESSION: Suspected chronic interstitial lung disease. Superimposed pneumonia is difficult to exclude.   Electronically Signed   By: Charline Bills M.D.   On: 03/19/2014 13:05    Jeoffrey Massed, MD  [facility] Pager:[phone]  If 7PM-7AM, please contact night-coverage [email or web address] Password [email or web address] 03/30/2014, 10:02 AM   LOS: 2 days

## 2014-03-30 NOTE — Clinical Social Work Note (Signed)
CSW spoke with patients son concerning [facility].  Patients son would like for [employer] to call and discuss [facility].    CSW contacted [employer] and she will contact patients son to discuss [facility].  CSW will continue to follow.  Merlyn Lot, LCSWA Clinical Social Worker [phone]

## 2014-03-30 NOTE — Progress Notes (Signed)
ANTICOAGULATION CONSULT NOTE - Follow Up Consult  Pharmacy Consult for Heparin Indication: atrial fibrillation  No Known Allergies  Patient Measurements: Height: 6' (182.9 cm) Weight: 219 lb 12.8 oz (99.7 kg) IBW/kg (Calculated) : 77.6 Heparin Dosing Weight: 102 kg  Vital Signs: Temp: 97.5 F (36.4 C) (11/24 1251) Temp Source: Axillary (11/24 1251) BP: 103/53 mmHg (11/24 1251) Pulse Rate: 82 (11/24 1251)  Labs:  Recent Labs  2014-04-15 1520  03/29/14 0543 03/29/14 0905 03/29/14 2034 03/30/14 0637 03/30/14 1448  HGB 13.4  --   --   --   --   --   --   HCT 39.1  --   --   --   --   --   --   PLT 214  --   --   --   --   --   --   APTT  --   < > 175* >200* 132* 108* 64*  HEPARINUNFRC  --   --  >2.20* >2.20*  --  >2.20*  --   CREATININE 3.09*  --  3.20*  --   --  2.68*  --   < > = values in this interval not displayed.  Estimated Creatinine Clearance: 24.6 mL/min (by C-G formula based on Cr of 2.68).  Assessment: 80yo male presents to ED with AMS and weakness x 2 days.Patient is on apixiban at home for afib and now on heparin (patient with ARF and also noted not able to swallow). With recent apixiban heparin monitoring with aPTTs and aPTT is now subtherapeutic at 64 on heparin 750 units/hr.  Goal of Therapy:  Heparin level 0.3-0.7 units/ml  APTT 66-102 Monitor platelets by anticoagulation protocol: Yes  Plan:  -Increase heparin to 850 units/hr -aPTT in 8hrs -Daily heparin level, aPTT and CBC  Arlean Hopping. Newman Pies, PharmD Clinical Pharmacist Pager [phone] 03/30/2014 4:48 PM    -

## 2014-03-31 ENCOUNTER — Encounter: Payer: Self-pay | Admitting: *Deleted

## 2014-03-31 LAB — BASIC METABOLIC PANEL
ANION GAP: 19 — AB (ref 5–15)
BUN: 118 mg/dL — ABNORMAL HIGH (ref 6–23)
CALCIUM: 8.1 mg/dL — AB (ref 8.4–10.5)
CO2: 19 mEq/L (ref 19–32)
CREATININE: 2.21 mg/dL — AB (ref 0.50–1.35)
Chloride: 107 mEq/L (ref 96–112)
GFR calc non Af Amer: 25 mL/min — ABNORMAL LOW (ref >90)
GFR, EST AFRICAN AMERICAN: 30 mL/min — AB (ref >90)
Glucose, Bld: 172 mg/dL — ABNORMAL HIGH (ref 70–99)
Potassium: 4 mEq/L (ref 3.7–5.3)
SODIUM: 145 meq/L (ref 137–147)

## 2014-03-31 LAB — APTT
APTT: 54 s — AB (ref 24–37)
aPTT: 56 seconds — ABNORMAL HIGH (ref 24–37)

## 2014-03-31 LAB — LEGIONELLA ANTIGEN, URINE

## 2014-03-31 LAB — HEPARIN LEVEL (UNFRACTIONATED): HEPARIN UNFRACTIONATED: 1.98 [IU]/mL — AB (ref 0.30–0.70)

## 2014-03-31 MED ORDER — SCOPOLAMINE 1 MG/3DAYS TD PT72
1.0000 | MEDICATED_PATCH | TRANSDERMAL | Status: DC
  Administered 2014-03-31: 1.5 mg via TRANSDERMAL
  Filled 2014-03-31: qty 1

## 2014-03-31 MED ORDER — SODIUM CHLORIDE 0.9 % IV SOLN
4.0000 mg/h | INTRAVENOUS | Status: DC
  Administered 2014-03-31: 2 mg/h via INTRAVENOUS
  Filled 2014-03-31: qty 10

## 2014-03-31 MED ORDER — ATROPINE SULFATE 1 % OP SOLN
2.0000 [drp] | Freq: Four times a day (QID) | OPHTHALMIC | Status: DC
  Administered 2014-03-31 (×2): 2 [drp] via SUBLINGUAL
  Filled 2014-03-31: qty 2

## 2014-03-31 MED ORDER — MORPHINE BOLUS VIA INFUSION
2.0000 mg | INTRAVENOUS | Status: DC | PRN
Start: 2014-03-31 — End: 2014-04-01
  Administered 2014-03-31 (×3): 2 mg via INTRAVENOUS
  Filled 2014-03-31 (×4): qty 2

## 2014-03-31 MED ORDER — MORPHINE SULFATE 2 MG/ML IJ SOLN: 2.0000 mg | INTRAMUSCULAR | Status: DC | PRN

## 2014-03-31 MED ORDER — ACETAMINOPHEN 650 MG RE SUPP: 650.0000 mg | Freq: Three times a day (TID) | RECTAL | Status: DC | PRN

## 2014-03-31 MED ORDER — LORAZEPAM 2 MG/ML IJ SOLN: 1.0000 mg | INTRAMUSCULAR | Status: DC | PRN

## 2014-03-31 NOTE — Progress Notes (Signed)
Met with son at bedside, explained that patient continues to deteriorate inspite of IV Abx, Lasix. Recommended we transition to comfort measures,he was agreeable. Stop IV Heparin, IV Abx and IV Lasix.Start Morphine gtt. Social work consulted for Residential Hospice placement.

## 2014-03-31 NOTE — Clinical Social Work Psychosocial (Signed)
Clinical Social Work Department BRIEF PSYCHOSOCIAL ASSESSMENT 03/31/2014  Patient:  Richard Chavez, Richard Chavez     Account Number:  [id number]     Admit date:  03/15/2014  Clinical Social Worker:  Merlyn Lot, CLINICAL SOCIAL WORKER  Date/Time:  03/30/2014 02:00 PM  Referred by:  Physician  Date Referred:  03/30/2014 Referred for  Residential hospice placement   Other Referral:   Interview type:  Family Other interview type:    PSYCHOSOCIAL DATA Living Status:  FACILITY Admitted from facility:  [facility] Level of care:  Skilled Nursing Facility Primary support name:  Richard Chavez Primary support relationship to patient:  CHILD, ADULT Degree of support available:   Son has visited patient everyday and is very involved in patients care    CURRENT CONCERNS Current Concerns  Post-Acute Placement   Other Concerns:    SOCIAL WORK ASSESSMENT / PLAN CSW spoke with patients son concerning residential hospice placement.  Patients son is agreeable to residential hospice as an option.  CSW referred to [facility]- no beds available.  CSW informed patients son that [facility] not [facility] beds at this time and patients son is considering home hospice vs residential in [place].   Assessment/plan status:  Psychosocial Support/Ongoing Assessment of Needs Other assessment/ plan:   Information/referral to community resources:   [facility]    PATIENT'S/FAMILY'S RESPONSE TO PLAN OF CARE: Patients son is agreeable to plan but is unsure what is the best decision at this time.       Merlyn Lot, LCSWA Clinical Social Worker [phone]

## 2014-03-31 NOTE — Progress Notes (Signed)
ASHAI BUNCE [id number]  Transfer Data: 03/31/2014 2:33 PM  Attending Provider: Maretta Bees, MD  UDJ:[id number],HYIFO, MD  Code Status: DNR  CLEOPHAS VERDERAME is a 80 y.o. male patient transferred from [facility]  -No acute distress noted.  -No complaints of shortness of breath.  -No complaints of chest pain.   Blood pressure 125/54, pulse 98, temperature 98.1 F (36.7 C), temperature source Oral, resp. rate 20, height 6' (1.829 m), weight 97.4 kg (214 lb 11.7 oz), SpO2 96 %.  ?  IV Fluids: IV in place, occlusive dsg intact without redness, IV cath forearm right, condition patent and no redness  normal saline.  Allergies: Review of patient's allergies indicates no known allergies.  Past Medical History:  has a past medical history of CAD (coronary artery disease), native coronary artery; Hypertension; Hyperlipidemia; GERD (gastroesophageal reflux disease); COPD (chronic obstructive pulmonary disease); CRF (chronic renal failure); Barrett's esophagus; OP (osteoporosis); CAD (coronary artery disease) of artery bypass graft (07/04/2011); Syncope (07/04/2011); Pacemaker (07/04/2011); Cardiomyopathy, ischemic (07/2011; 09/2012); Myocardial infarction; Peripheral vascular disease; Arthritis; BPH (benign prostatic hyperplasia); Polyp of colon; Sleep apnea; AV block, 3rd degree; BPH (benign prostatic hypertrophy) (11/15/2011); Paroxysmal atrial fibrillation - now rate controlled ([facility]ally with [facility]) (10/03/2012); CVA (cerebral infarction) (10/09/2012); Pneumonia (02/02/2014); Shortness of breath; and Stroke.  Past Surgical History:  has past surgical history that includes pacemaker placement (2007); Cholecystectomy (2005); Coronary artery bypass graft (1978); Lesion excision; Cardiac catheterization (06/06/2011); Cardiac catheterization (06/14/2010); Cardiac catheterization (04/12/2003); Cardiac catheterization (10/07/2000); Doppler echocardiography (10/02/2012); and Prostate surgery.  Social History:  reports that he  quit smoking about 28 years ago. His smoking use included Cigarettes. He has a 4.5 pack-year smoking history. He has never used smokeless tobacco. He reports that he does not drink alcohol or use illicit drugs.  Patient/Family orientated to room. Information packet given to patient/family. Admission inpatient armband information verified with patient/family to include name and date of birth and placed on patient arm. Side rails up x 2, fall assessment and education completed with patient/family. Patient/family able to verbalize understanding of risk associated with falls and verbalized understanding to call for assistance before getting out of bed. Call light within reach. Patient/family able to voice and demonstrate understanding of unit orientation instructions.  Will continue to evaluate and treat per MD orders.

## 2014-03-31 NOTE — Progress Notes (Signed)
PATIENT DETAILS Name: Richard Chavez Age: 80 y.o. Sex: male Date of Birth: 09-11-1926 Admit Date: 03/17/2014 Admitting Physician Richard ShorterShanker Levora DredgeM Ghimire, MD ZOX:[id number]:MACKENZIE,BRIAN, MD  Subjective: Confused, Short of breath-easily desaturates to the mid 70's after removing 100 % NRB for a few minutes. Does not seem to be responding to 3 days of IV Abx, Lasix.  Assessment/Plan: Principal Problem:   Acute Hypoxic respiratory failure: Multifactorial, suspect secondary to HCAP with aspiration and acute CHF decompensation. Continue antibiotics, and Lasix for now, will await arrival of son Richard HuaDavid, we had a long conversation yesterday, since patient not improving, will recommend we start full comfort measures today. Suspect will best benefit from transfer to residential hospice.   Active Problems:   Sepsis: Secondary to HCAP. Continue IV antibiotics for now. Blood cultures on 11/22 negative so far. See above    HCAP (healthcare-associated pneumonia): Has extensive bilateral pulmonary infiltrates-given presentation with acute encephalopathy, elevated lactate and pro-calcitonin-suspect some amount of this is from aspiration pneumonia/HCAP. Continue empiric vancomycin and cefepime for. Seen by speech therapy on 11/23, unable to perform a formal evaluation given mental status and decreased hypoxia, after discussion with family, we agreed to Dysphagia 1 diet, with family accepting all risks of aspiration. See above.    Acute decompensated systolic heart failure: Chest x-ray shows bilateral pulmonary infiltrates, BNP significantly elevated, suspect that some amount of respiratory failure is from decompensated systolic heart failure. Given low rate of IV fluids on admission, however has now been started on IV Lasix-currently around 2.8 L neg balance. Unfortunately still very hypoxic and SOB. Will recommend that patient be transitioned to full comfort measures, when son/family arrives today.    Acute  encephalopathy: Essentially unchanged, likely secondary to above.    Acute renal failure: Likely secondary to acute decompensated CHF/sepsis. Urine output much improved with initiation of IV Lasix.Although creatinine down trending, without any improvement in hypoxia/frailty-suspect will continue to decline. When family arrives later today, will see if they are now ready to transition to residential hospice/comfort care.   Paroxysmal atrial fibrillation: On amiodarone-will continue if able to swallow.Eliquis on hold, continue heparin infusion per pharmacy. Remains in sinus rhythm.   COPD (chronic obstructive pulmonary disease): On chronic prednisone, suspect would not be able to take oral medications at this time, started on IV Solu-Medrol 40 mg twice a day and scheduled bronchodilators. Has both rales and rhonchi.   CAD CABG 1998, patent grafts 06/06/11 after abn Myoview: Respiratory failure from either pneumonia or decompensated CHF. Initial point-of-care troponin was negative-given poor overall prognosis-further enzymes were not cycled.  Palliative care: See H&P. But in short-DO NOT RESUSCITATE, no further escalation and care. We will await arrival of family, and see if family willing to initiate full comfort measures.   Disposition: Remain inpatient-Residential Hospice on discharge if ok with family  Antibiotics: See below. Anti-infectives    Start     Dose/Rate Route Frequency Ordered Stop   03/29/14 2100  ceFEPIme (MAXIPIME) 1 g in dextrose 5 % 50 mL IVPB     1 g100 mL/hr over 30 Minutes Intravenous Every 24 hours 03/29/14 1853 04/04/14 2059   03/29/14 1800  ceFEPIme (MAXIPIME) 1 g in dextrose 5 % 50 mL IVPB  Status:  Discontinued     1 g100 mL/hr over 30 Minutes Intravenous Every 24 hours 03/30/2014 1859 03/29/14 1853   03/25/2014 1700  vancomycin (VANCOCIN) 1,500 mg in sodium chloride 0.9 % 500 mL IVPB  1,500 mg250 mL/hr over 120 Minutes Intravenous Every 48 hours 03/24/2014 1648      03/30/2014 1600  ceFEPIme (MAXIPIME) 1 g in dextrose 5 % 50 mL IVPB     1 g100 mL/hr over 30 Minutes Intravenous  Once 03/17/2014 1550 03/26/2014 1707     DVT Prophylaxis: IV Heparin gtt  Code Status:  DNR  Family Communication Son-Chavez   Procedures:  None  CONSULTS:  Palliative Care  Time spent 40 minutes-which includes 50% of the time with face-to-face with patient/ family and coordinating care related to the above assessment and plan.  MEDICATIONS: Scheduled Meds: . amiodarone  400 mg Oral Daily  . antiseptic oral rinse  7 mL Mouth Rinse q12n4p  . ceFEPime (MAXIPIME) IV  1 g Intravenous Q24H  . chlorhexidine  15 mL Mouth Rinse BID  . furosemide  80 mg Intravenous 3 times per day  . ipratropium  0.5 mg Nebulization TID  . levalbuterol  0.63 mg Nebulization TID  . methylPREDNISolone (SOLU-MEDROL) injection  40 mg Intravenous Q12H  . vancomycin  1,500 mg Intravenous Q48H   Continuous Infusions: . heparin 950 Units/hr (03/31/14 0329)   PRN Meds:.acetaminophen, bisacodyl, levalbuterol, LORazepam, morphine injection, ondansetron (ZOFRAN) IV    PHYSICAL EXAM: Vital signs in last 24 hours: Filed Vitals:   03/30/14 2000 03/30/14 2037 03/30/14 2355 03/31/14 0340  BP: 98/53 98/53 108/50 125/36  Pulse: 83 87 87 98  Temp:  97 F (36.1 C) 97.3 F (36.3 C) 97.6 F (36.4 C)  TempSrc:  Axillary Axillary Axillary  Resp: 22 21 21 20   Height:      Weight:    97.4 kg (214 lb 11.7 oz)  SpO2: 90% 93% 93% 92%    Weight change:  Filed Weights   03/31/2014 1602 03/29/14 0343 03/31/14 0340  Weight: 102.513 kg (226 lb) 99.7 kg (219 lb 12.8 oz) 97.4 kg (214 lb 11.7 oz)   Body mass index is 29.12 kg/(m^2).   Gen Exam: Awake and confused. Remains in moderate amount of distress Neck: Supple, No JVD.   Chest: B/L rales/rhonchi all over CVS: S1 S2 Regular Abdomen: soft, BS +, non tender, non distended.  Extremities: 1+ edema, lower extremities warm to touch. Neurologic: Non  Focal-but poor exam-diffusely weak Skin: No Rash.   Wounds: N/A.    Intake/Output from previous day:  Intake/Output Summary (Last 24 hours) at 03/31/14 0849 Last data filed at 03/31/14 0400  Gross per 24 hour  Intake 627.02 ml  Output   2000 ml  Net -1372.98 ml     LAB RESULTS: CBC  Recent Labs Lab 04/05/2014 1520  WBC 9.5  HGB 13.4  HCT 39.1  PLT 214  MCV 82.3  MCH 28.2  MCHC 34.3  RDW 19.8*  LYMPHSABS 1.4  MONOABS 0.6  EOSABS 0.4  BASOSABS 0.0    Chemistries   Recent Labs Lab 03/07/2014 1520 03/29/14 0543 03/30/14 0637 03/31/14 0125  NA 132* 136* 141 145  K 4.7 5.0 4.5 4.0  CL 93* 99 104 107  CO2 19 16* 20 19  GLUCOSE 89 132* 163* 172*  BUN 77* 88* 109* 118*  CREATININE 3.09* 3.20* 2.68* 2.21*  CALCIUM 9.0 8.3* 8.3* 8.1*    CBG:  Recent Labs Lab 04/03/2014 1436  GLUCAP 96    GFR Estimated Creatinine Clearance: 29.6 mL/min (by C-G formula based on Cr of 2.21).  Coagulation profile No results for input(s): INR, PROTIME in the last 168 hours.  Cardiac Enzymes No results for  input(s): CKMB, TROPONINI, MYOGLOBIN in the last 168 hours.  Invalid input(s): CK  Invalid input(s): POCBNP No results for input(s): DDIMER in the last 72 hours. No results for input(s): HGBA1C in the last 72 hours. No results for input(s): CHOL, HDL, LDLCALC, TRIG, CHOLHDL, LDLDIRECT in the last 72 hours. No results for input(s): TSH, T4TOTAL, T3FREE, THYROIDAB in the last 72 hours.  Invalid input(s): FREET3 No results for input(s): VITAMINB12, FOLATE, FERRITIN, TIBC, IRON, RETICCTPCT in the last 72 hours. No results for input(s): LIPASE, AMYLASE in the last 72 hours.  Urine Studies No results for input(s): UHGB, CRYS in the last 72 hours.  Invalid input(s): UACOL, UAPR, USPG, UPH, UTP, UGL, UKET, UBIL, UNIT, UROB, ULEU, UEPI, UWBC, URBC, UBAC, CAST, UCOM, BILUA  MICROBIOLOGY: Recent Results (from the past 240 hour(s))  Blood culture (routine x 2)     Status: None  (Preliminary result)   Collection Time: 03/21/2014  3:26 PM  Result Value Ref Range Status   Specimen Description BLOOD RIGHT ARM  Final   Special Requests BOTTLES DRAWN AEROBIC AND ANAEROBIC 5CC  Final   Culture  Setup Time   Final    03/25/2014 21:35 Performed at [facility]    Culture   Final           BLOOD CULTURE RECEIVED NO GROWTH TO DATE CULTURE WILL BE HELD FOR 5 DAYS BEFORE ISSUING A FINAL NEGATIVE REPORT Performed at [facility]    Report Status PENDING  Incomplete  Blood culture (routine x 2)     Status: None (Preliminary result)   Collection Time: 03/14/2014  3:36 PM  Result Value Ref Range Status   Specimen Description BLOOD LEFT ARM  Final   Special Requests BOTTLES DRAWN AEROBIC ONLY 8CC  Final   Culture  Setup Time   Final    03/12/2014 21:35 Performed at [facility]    Culture   Final           BLOOD CULTURE RECEIVED NO GROWTH TO DATE CULTURE WILL BE HELD FOR 5 DAYS BEFORE ISSUING A FINAL NEGATIVE REPORT Performed at [facility]    Report Status PENDING  Incomplete  MRSA PCR Screening     Status: None   Collection Time: 03/30/2014  7:02 PM  Result Value Ref Range Status   MRSA by PCR NEGATIVE NEGATIVE Final    Comment:        The GeneXpert MRSA Assay (FDA approved for NASAL specimens only), is one component of a comprehensive MRSA colonization surveillance program. It is not intended to diagnose MRSA infection nor to guide or monitor treatment for MRSA infections.     RADIOLOGY STUDIES/RESULTS: Dg Chest Port 1 View  03/30/2014   CLINICAL DATA:  Pneumonia, acute respiratory failure  EXAM: PORTABLE CHEST - 1 VIEW  COMPARISON:  03/30/2014, 03/09/2014  FINDINGS: Bilateral patchy interstitial and alveolar airspace opacities. Trace bilateral pleural effusions. No pneumothorax. Stable cardiomegaly. Prior CABG. Two lead cardiac pacer. Unremarkable osseous structures.  IMPRESSION: 1. Bilateral patchy interstitial and alveolar  airspace opacities which may reflect multilobar pneumonia versus pulmonary edema versus ARDS. No significant interval change compared with 04/03/2014.   Electronically Signed   By: Elige Ko   On: 03/30/2014 17:38   Dg Chest Port 1 View  03/30/2014   CLINICAL DATA:  Acute respiratory failure.  EXAM: PORTABLE CHEST - 1 VIEW  COMPARISON:  03/29/2014.  FINDINGS: Trachea is midline. Heart size is grossly stable. There is worsening diffuse  bilateral airspace disease, left greater than right. Probable bilateral pleural effusions.  IMPRESSION: Slight worsening in diffuse bilateral airspace disease, left greater than right, which may be due to edema or pneumonia. Small bilateral effusions, left greater than right.   Electronically Signed   By: Leanna Battles M.D.   On: 03/30/2014 10:42   Dg Chest Port 1 View  03/29/2014   CLINICAL DATA:  Shortness breath.  EXAM: PORTABLE CHEST - 1 VIEW  COMPARISON:  April 20, 2014, 02/20/2014, 02/04/2014.  FINDINGS: Mediastinum and hilar structures are unremarkable. Cardiac pacer with lead tips in right atrium and right ventricle. Prior CABG. Cardiomegaly. Unchanged prominent bilateral pulmonary infiltrates are present. No significant pleural effusion. No pneumothorax.  IMPRESSION: 1. Persistent severe bilateral pulmonary infiltrates, unchanged. 2. Prior CABG.  Cardiomegaly.  Cardiac pacer in stable position.   Electronically Signed   By: Maisie Fus  Register   On: 03/29/2014 08:07   Dg Chest Portable 1 View  Apr 20, 2014   CLINICAL DATA:  Per ED note: Pt arrived by [facility] from [facility]. Pt was just dc from [facility] on Monday for pneumonia. Called ems for hypotension and resp distress. Wheezing/crackles noted by ems, given neb tx pta, diminished lower lung sounds, altered mental status, did not tolerate cpap pta.  EXAM: PORTABLE CHEST - 1 VIEW  COMPARISON:  03/19/2014  FINDINGS: Right-sided pacemaker and sternotomy wires are unchanged. Lungs are adequately inflated with diffuse  bilateral airspace opacification which is worse and likely represents infection although cannot exclude a component of interstitial edema. No definite effusion. Moderate stable cardiomegaly. Remainder of the exam is unchanged.  IMPRESSION: Worsening diffuse bilateral airspace process likely infection, although cannot exclude a component interstitial edema.  Stable cardiomegaly.   Electronically Signed   By: Elberta Fortis M.D.   On: 04/20/2014 15:01   Dg Chest Port 1 View  03/19/2014   CLINICAL DATA:  Weakness, hypotensive  EXAM: PORTABLE CHEST - 1 VIEW  COMPARISON:  None.  FINDINGS: Chronic interstitial/airspace opacities, suspicious for chronic interstitial lung disease. Superimposed pneumonia is difficult to exclude. No pleural effusion or pneumothorax.  Cardiomegaly. Postsurgical changes related to prior CABG. Right subclavian pacemaker.  IMPRESSION: Suspected chronic interstitial lung disease. Superimposed pneumonia is difficult to exclude.   Electronically Signed   By: Charline Bills M.D.   On: 03/19/2014 13:05    Jeoffrey Massed, MD  [facility] Pager:[phone]  If 7PM-7AM, please contact night-coverage [email or web address] Password [email or web address] 03/31/2014, 8:49 AM   LOS: 3 days

## 2014-03-31 NOTE — Clinical Social Work Note (Addendum)
4:00 CSW attempted to contact [facility]: [phone])- no answer- CSW has not received call from [facility] with bed offer for patient.  12:30pm  CSW met with patients son at bedside.  Patients son stated that he had met with the Dr and Dr had informed him that the patient was now comfort care.  Patients son was under the impression that comfort care meant that patient could remain in the hospital until patient death- he also believed that residential hospice would not take patient unless likely to live more than 2 weeks.  CSW clarified that residential hospice takes patients who are expected to live less than 2 weeks and also clarified with patient that the Dr is hoping to DC patient to residential hospice and that comfort care does not mean that patient will be expected to have a hospital death.  Patients son took this information well and understands that residential hospice would be the best environment for the patient in his condition.  CSW spoke with patients son concerning patients decline.  CSW spoke with patients son about his support system during this time and patients son reported that another family member will be coming today to help make these decisions.    Patients son was agreeable to hospice search in [place]- patients son prefers [place] due to family convenience.  CSW faxed information to [facility] and [facility].  [facility] called patients son and was told that patients family would prefer [place] so now [facility] is unwilling to follow until [place] is ruled out.  CSW spoke with Hilda Blades at [facility]- they are reviewing patients information and will inform CSW of decision.  10:30am  CSW spoke with [facility] admissions- no beds available today.  CSW then spoke with patients son who stated he wanted to speak with Palliative care again to decide whether or not to pursue another residential hospice option or to  take patient home with home hospice.  CSW spoke with [facility] to assess bed availability- currently there are beds available but they are expected to fill up fast.  CSW will continue to follow.  Domenica Reamer, [place] Social Worker [phone]

## 2014-03-31 NOTE — Progress Notes (Signed)
ANTICOAGULATION CONSULT NOTE - Follow Up Consult  Pharmacy Consult for Heparin  Indication: atrial fibrillation  No Known Allergies  Patient Measurements: Height: 6' (182.9 cm) Weight: 219 lb 12.8 oz (99.7 kg) IBW/kg (Calculated) : 77.6  Vital Signs: Temp: 97.3 F (36.3 C) (11/24 2355) Temp Source: Axillary (11/24 2355) BP: 108/50 mmHg (11/24 2355) Pulse Rate: 87 (11/24 2355)  Labs:  Recent Labs  23-Apr-2014 1520  03/29/14 0543 03/29/14 0905  03/30/14 0637 03/30/14 1448 03/31/14 0125  HGB 13.4  --   --   --   --   --   --   --   HCT 39.1  --   --   --   --   --   --   --   PLT 214  --   --   --   --   --   --   --   APTT  --   < > 175* >200*  < > 108* 64* 54*  HEPARINUNFRC  --   < > >2.20* >2.20*  --  >2.20*  --  1.98*  CREATININE 3.09*  --  3.20*  --   --  2.68*  --  2.21*  < > = values in this interval not displayed.  Estimated Creatinine Clearance: 29.9 mL/min (by C-G formula based on Cr of 2.21).  Assessment: Heparin for afib, using aPTT to guide dosing for now given PTA apixaban, aPTT is low at 54 despite rate increase, no infusion issues per RN.   Goal of Therapy:  Heparin level 0.3-0.7 units/ml aPTT 66-102 seconds Monitor platelets by anticoagulation protocol: Yes   Plan:  -Increase heparin to 950 units/hr -1100 aPTT -Daily aPTT/CBC/HL -Monitor for bleeding  Richard Chavez 03/31/2014,2:42 AM

## 2014-03-31 NOTE — Consult Note (Signed)
[facility] Liaison: Received request 11/24 from CSW for "education only" to patient's son [name]. Spoke with [name] by phone 11/24 to answer questions and explain services. [name] very appreciative of call and uncertain of when patient will be ready for discharge. Received official referral 11/25 from CSW. Have made [facility] admissions coordinator aware, clinical information being reviewed. Will update CSW. Thank you. Forrestine Him LCSW [phone]

## 2014-03-31 NOTE — Progress Notes (Signed)
03/31/2014 Patient was made comfort care today. He was placed on Morphine drip and eventually transfer to 5 west 19 and he was on non re breather mask at 15 Liters  He is alert to self, report was given to nurse on [place] on 5 west. Lovie Macadamia RN.

## 2014-04-02 NOTE — Discharge Summary (Signed)
Death Summary  [name]:[id number] DOB: [birth year] DOA: 03-31-14  PCP: Thayer Headings, MD PCP/Office notified: will electronically route discharge summary to PCP  Admit date: 03/31/2014 Date of Death: 04/04/2014  Final Diagnoses:  Principal Problem:   Acute respiratory failure Active Problems:   COPD (chronic obstructive pulmonary disease)   CAD CABG 1998, patent grafts 06/06/11 after abn Myoview   Paroxysmal atrial fibrillation   HCAP (healthcare-associated pneumonia)   Sepsis   Chronic systolic CHF (congestive heart failure)   Dyspnea and respiratory abnormality   Palliative care encounter   ARF (acute renal failure)   Acute renal failure syndrome   SOB (shortness of breath)   Acute systolic heart failur   History of present illness: Patient was a unfortunate 80 year old male with numerous recent admissions, history of severe chronic systolic heart failure with EF around 2025%, COPD on chronic prednisone therapy, history of CAD status post CABG who presented to the hospital on 2014-03-31 with worsening confusion, weakness and significantly worse shortness of breath. Patient was thought to have acute respiratory failure from a combination of acute systolic heart failure, HCAP/aspiration pneumonia. A shunt was then admitted to the step down unit for further evaluation and treatment   Hospital Course:  Acute Hypoxic respiratory failure: Multifactorial, suspect secondary to HCAP with aspiration and acute CHF decompensation. Unfortunately, continued to deteriorate in spite of broad-spectrum antibiotics, and intravenous Lasix. On admission, a long discussion with patient's son-[name] was done by this M.D., patient was made a DO NOT RESUSCITATE. Since he continued to deteriorate, further follow-up discussions with patient's son were done on a daily basis. On 11/25, patient was then transitioned to comfort care, started on IV morphine infusion. Patient subsequently expired on  Apr 05, 2023.  HCAP (healthcare-associated pneumonia): Had extensive bilateral pulmonary infiltrates-given presentation with acute encephalopathy, elevated lactate and pro-calcitonin-suspect some amount of this is from aspiration pneumonia/HCAP. Admitted and empirically started on IV antibiotics. Unfortunately continued to deteriorate, and was transitioned to full comfort care status on 11/25.  Acute decompensated systolic heart failure: Chest x-ray showed bilateral pulmonary infiltrates, BNP significantly elevated, suspected that some amount of respiratory failure is from decompensated systolic heart failure. Patient was placed on IV Lasix, in spite of diuresis, he made no significant improvement. Subsequently was transitioned to full comfort status.  Acute encephalopathy: Likely secondary to pneumonia. Managed with supportive care.  Acute renal failure: Secondary to pneumonia and acute systolic heart failure. Somewhat improved with diuresis, in spite of renal improvement, continued to have significant hypoxia. Transitioned to comfort care.  Paroxysmal atrial fibrillation: Was on Eliquis prior to this admission, on admission started on heparin infusion. However once transition to comfort care, anticoagulation was discontinued.   SignedJeoffrey Massed  [facility] 04/02/2014, 11:28 AM

## 2014-04-03 LAB — CULTURE, BLOOD (ROUTINE X 2)
CULTURE: NO GROWTH
Culture: NO GROWTH

## 2014-04-06 DEATH — deceased

## 2014-04-15 ENCOUNTER — Encounter (HOSPITAL_COMMUNITY): Payer: Self-pay | Admitting: Cardiology

## 2014-04-27 ENCOUNTER — Ambulatory Visit: Payer: Medicare Other | Admitting: Cardiology

## 2014-08-07 IMAGING — CR DG CHEST 1V PORT
1 series · 1 of 1 positions shown · non-contrast
Comparison: 05/16/2012 and earlier.

CLINICAL DATA: 85-year-old male weakness.  Pacemaker.  Chronic
pulmonary disease.

PORTABLE CHEST - 1 VIEW

[AP]
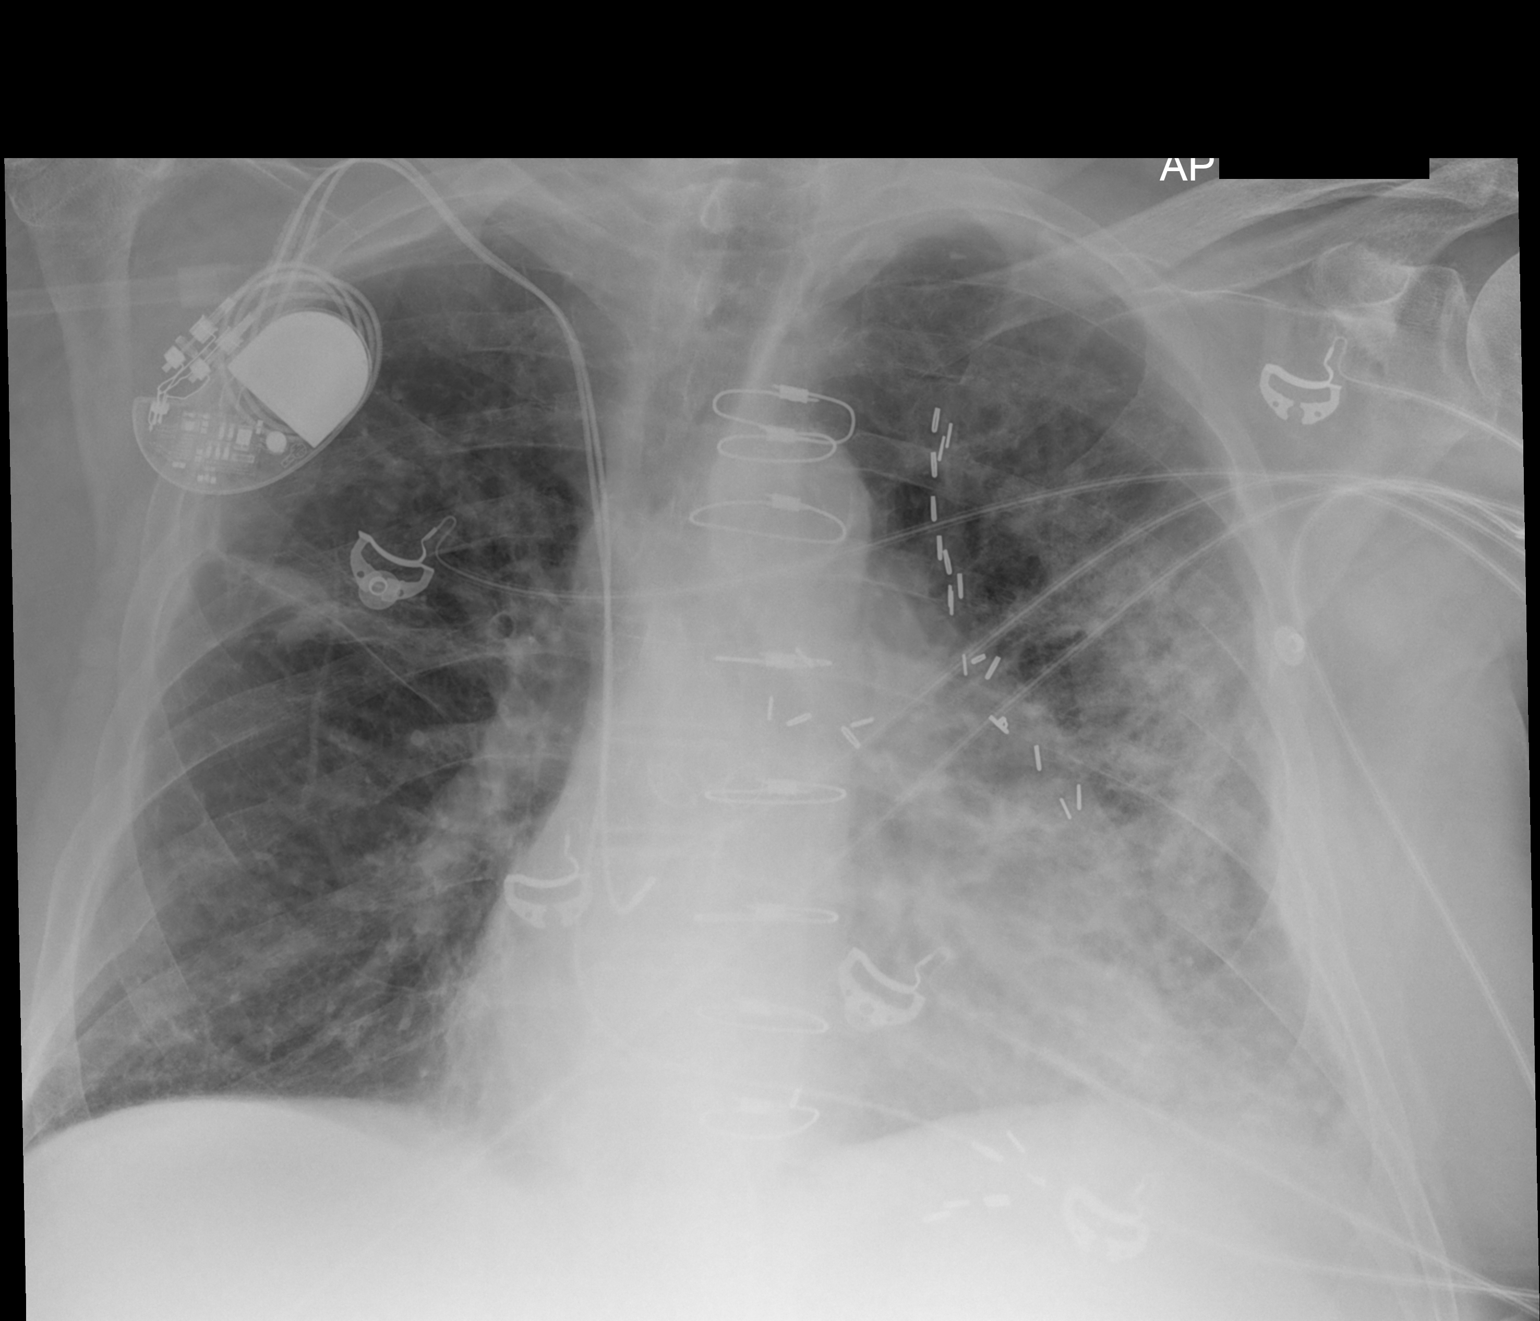

[1 of 1 positions shown; findings below may reference images not displayed]

FINDINGS: Portable AP semi upright view at 4424 hours.  New
widespread patchy and confluent left lung opacity, including left
upper lobe involvement.  Interval decreased right upper lobe
airspace disease and increased density along the right minor
fissure. Stable cardiomegaly and mediastinal contours.  Visualized
tracheal air column is within normal limits.  Stable right chest
cardiac pacemaker.  Sequelae of CABG.
IMPRESSION: 1.  New left lung airspace disease suspicious for multilobar
pneumonia.
2.  Interval decreased right upper lobe airspace disease and
thickening along the right minor fissure.
3.  Asymmetric pulmonary edema is a less likely consideration in
this setting.

## 2014-08-23 IMAGING — CR DG CHEST 2V
2 series · 2 of 2 positions shown · non-contrast
Comparison: 09/15/2012

CLINICAL DATA: Shortness of breath.  CABG.

CHEST - 2 VIEW

[w chest lat]
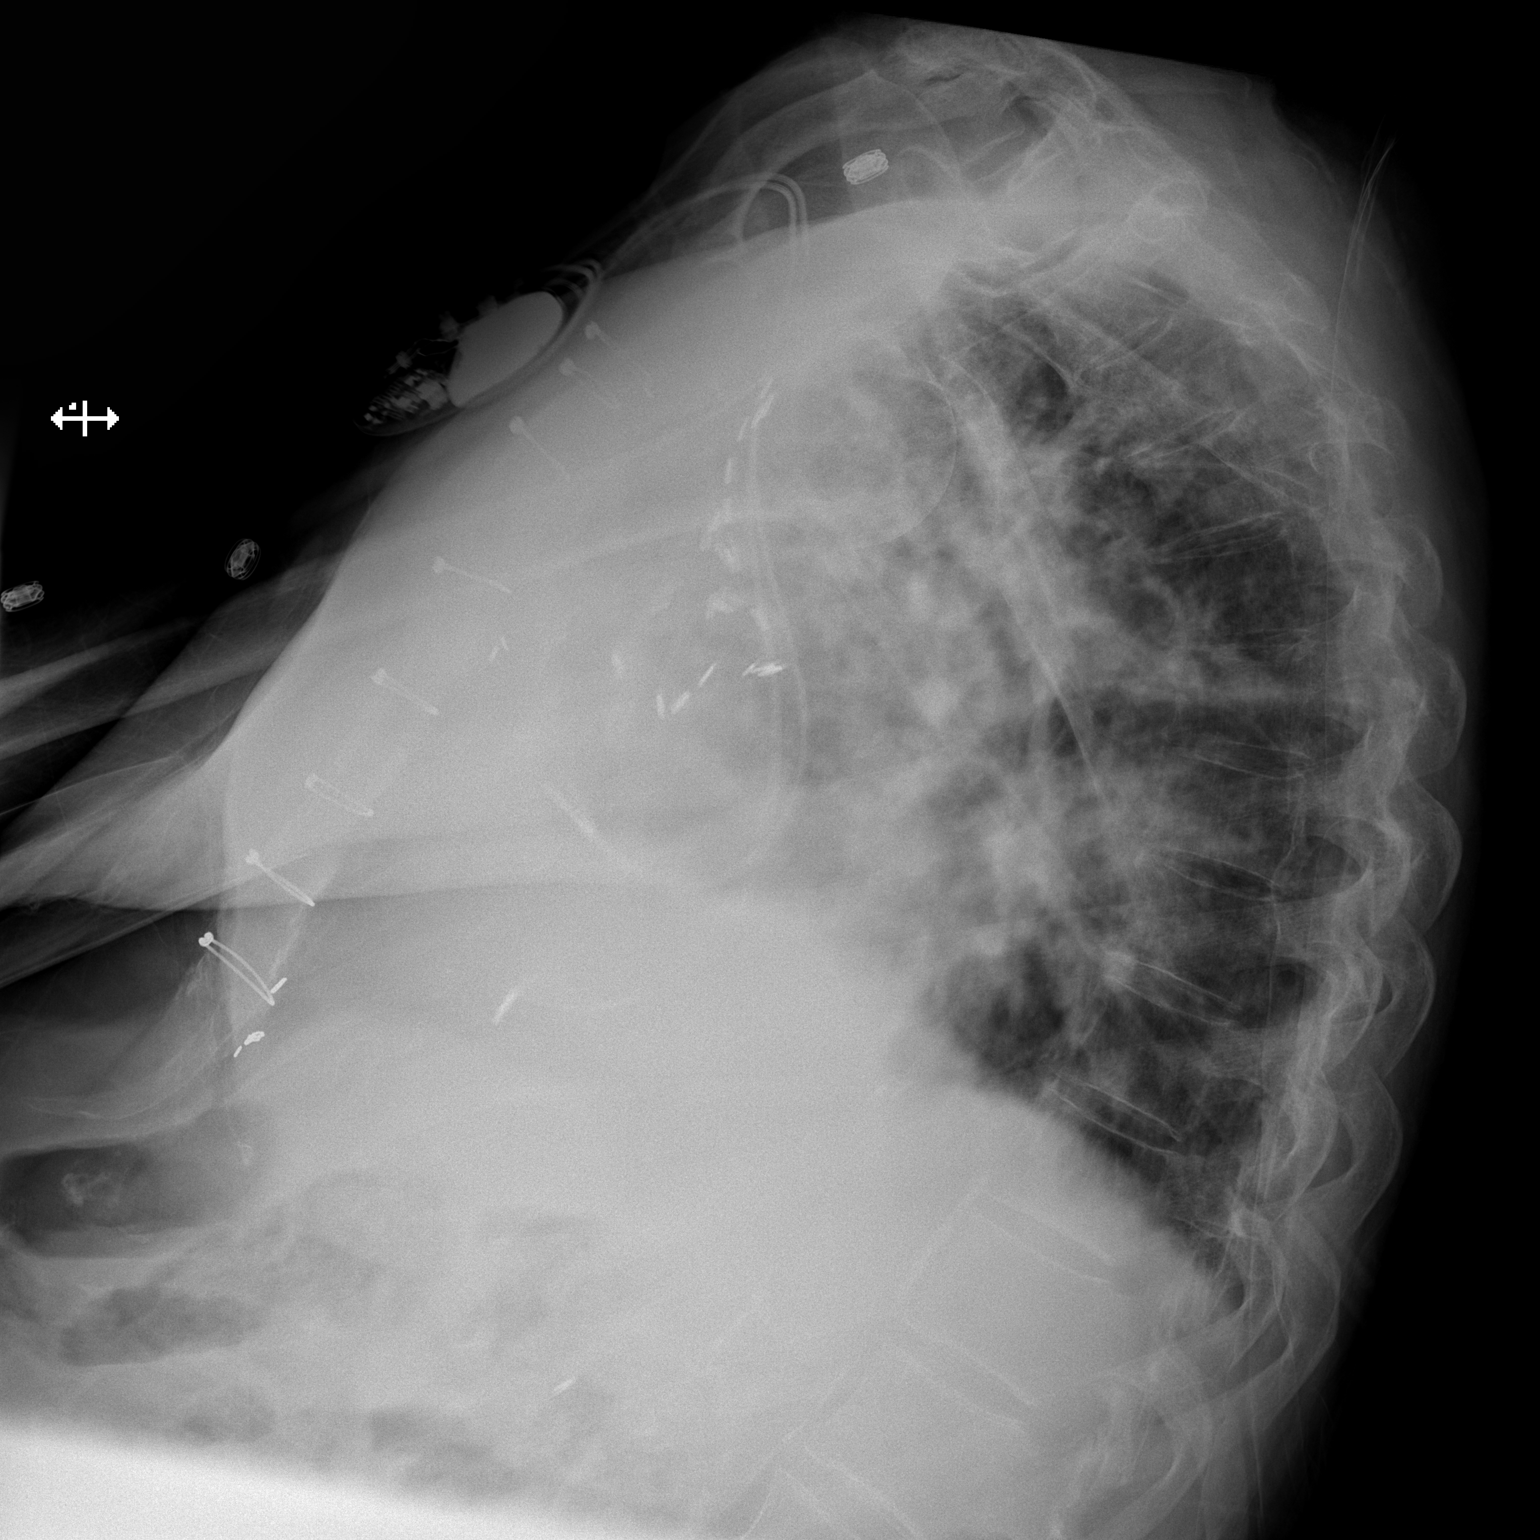

[x chest ap]
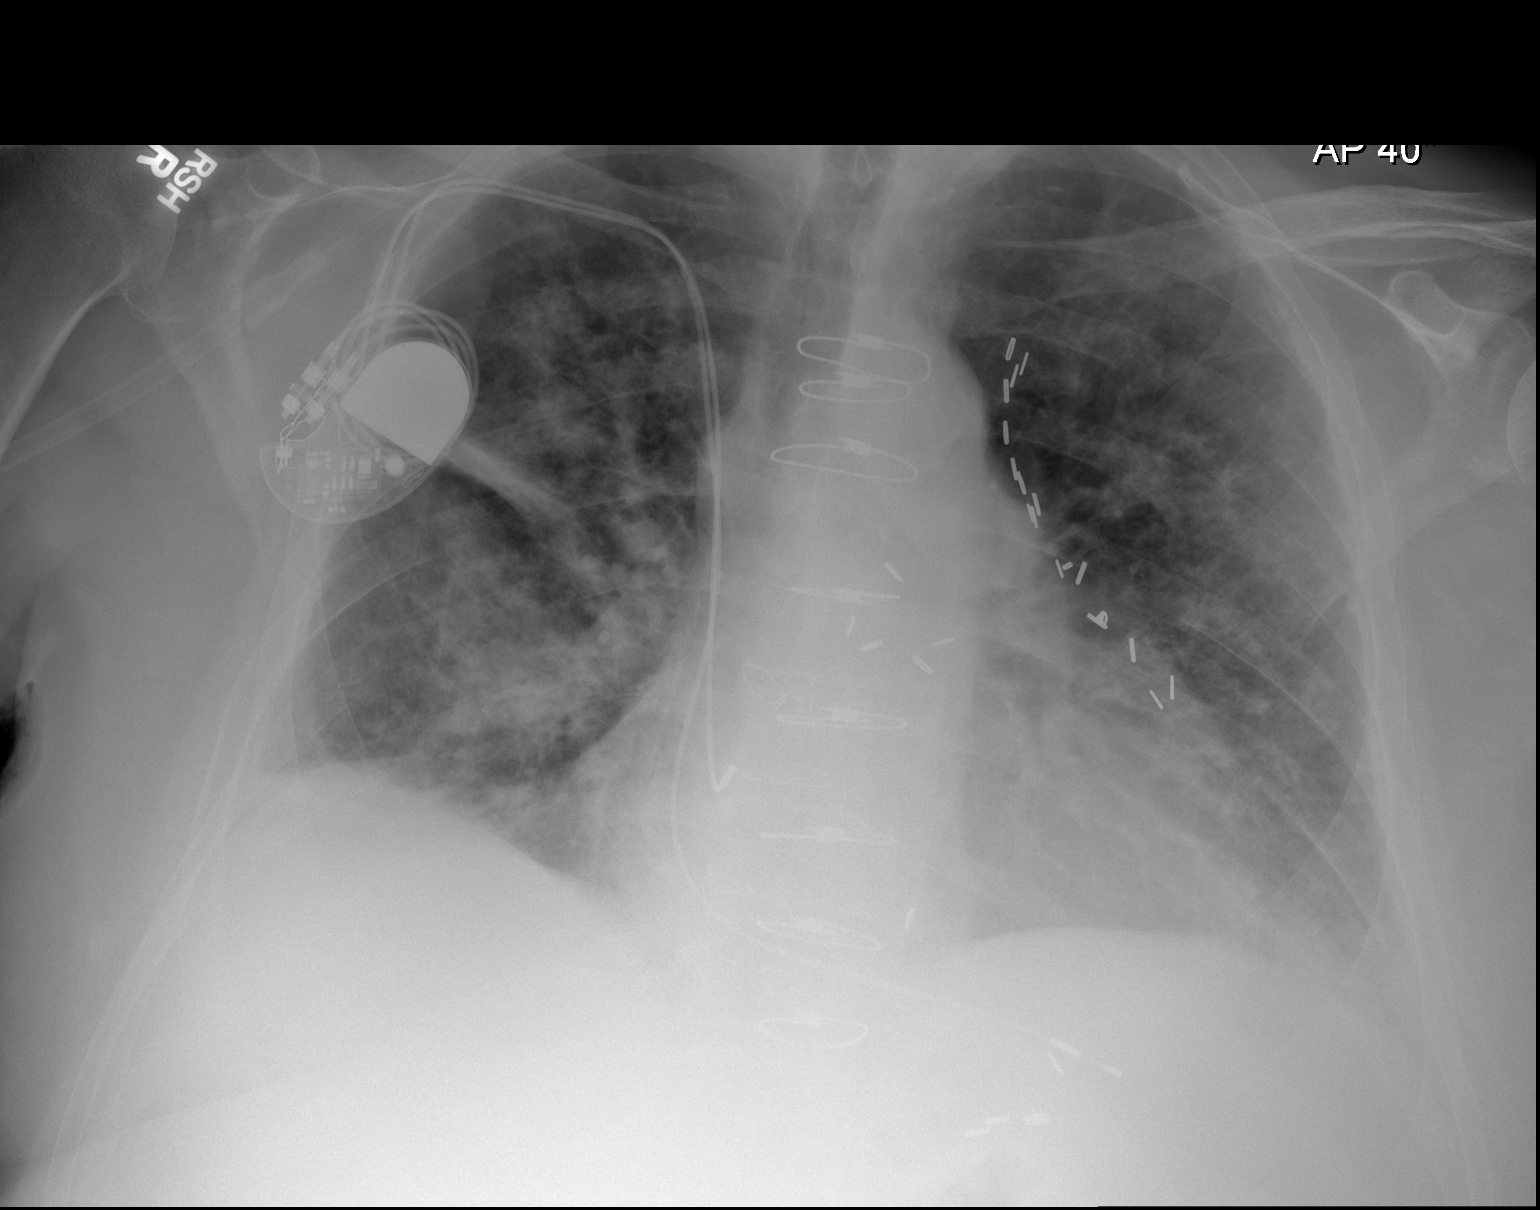

[2 of 2 positions shown; findings below may reference images not displayed]

FINDINGS: Motion and position degraded lateral view.  Aortic
atherosclerosis. Pacer with leads at right atrium and right
ventricle.  No lead discontinuity.

Cardiomegaly accentuated by AP portable technique.  No definite
pleural fluid. No pneumothorax.  Similar to minimal improvement in
interstitial and airspace disease involving the mid and inferior
left lung.  Worsened right-sided aeration with lower lobe
predominant interstitial and airspace disease. Somewhat to more
confluent inferior right upper lobe opacity is favored to represent
an area of scarring.
IMPRESSION: Worsened aeration, especially on the right.  Suspicious for
alveolar pulmonary edema versus multifocal infection.

Degraded exam, especially the lateral view.

## 2016-02-08 IMAGING — DX DG CHEST 1V PORT
1 series · 1 of 1 positions shown · non-contrast
Comparison: None.

CLINICAL DATA: Weakness, hypotensive

EXAM:
PORTABLE CHEST - 1 VIEW

[chest ap]
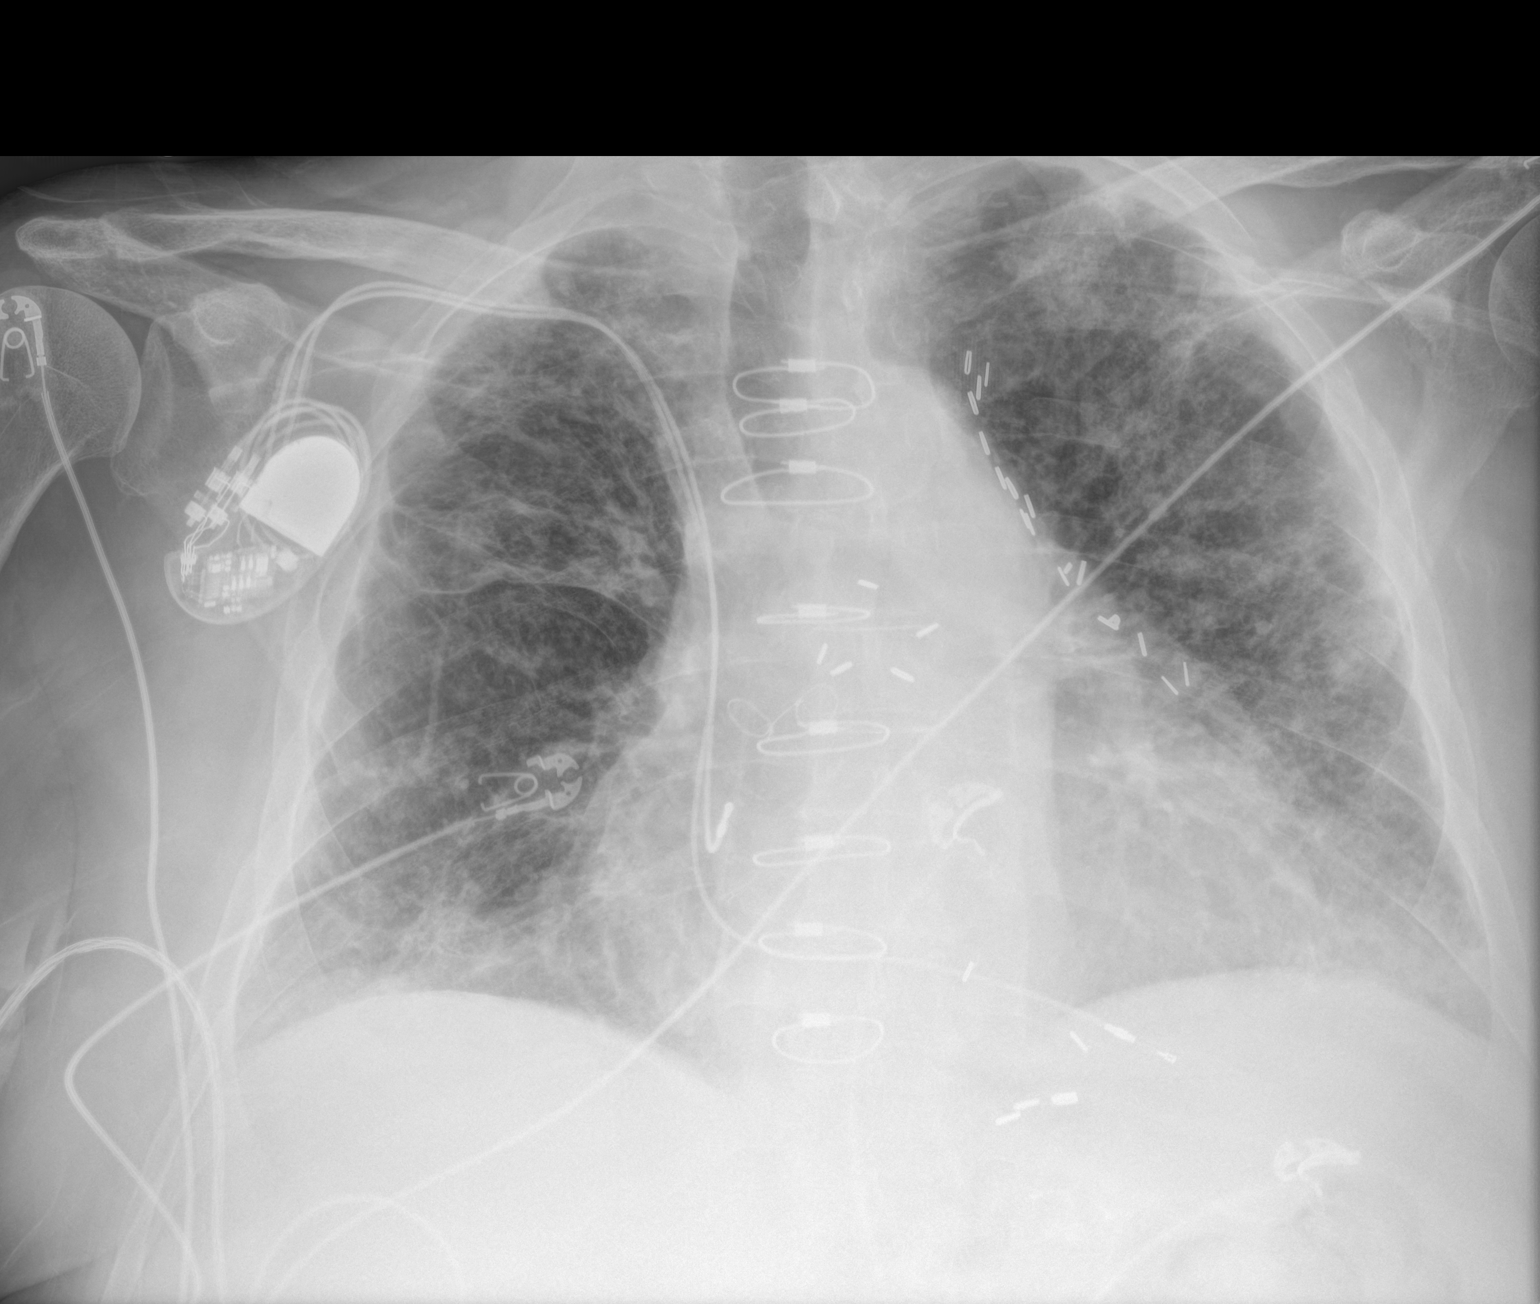

[1 of 1 positions shown; findings below may reference images not displayed]

FINDINGS: Chronic interstitial/airspace opacities, suspicious for chronic
interstitial lung disease. Superimposed pneumonia is difficult to
exclude. No pleural effusion or pneumothorax.

Cardiomegaly. Postsurgical changes related to prior CABG. Right
subclavian pacemaker.
IMPRESSION: Suspected chronic interstitial lung disease. Superimposed pneumonia
is difficult to exclude.
